# Patient Record
Sex: Female | Born: 1950 | Race: White | Hispanic: No | Marital: Married | Smoking: Never smoker
Health system: Southern US, Community
[De-identification: ages and names within clinical notes are randomized; demographics above are authoritative.]

## PROBLEM LIST (undated history)

## (undated) DIAGNOSIS — E039 Hypothyroidism, unspecified: Secondary | ICD-10-CM

## (undated) DIAGNOSIS — N186 End stage renal disease: Secondary | ICD-10-CM

## (undated) DIAGNOSIS — I10 Essential (primary) hypertension: Secondary | ICD-10-CM

## (undated) DIAGNOSIS — E079 Disorder of thyroid, unspecified: Secondary | ICD-10-CM

## (undated) DIAGNOSIS — I499 Cardiac arrhythmia, unspecified: Secondary | ICD-10-CM

## (undated) DIAGNOSIS — I4891 Unspecified atrial fibrillation: Secondary | ICD-10-CM

## (undated) HISTORY — PX: WRIST ARTHROSCOPY: SUR100

---

## 2008-11-06 ENCOUNTER — Emergency Department: Payer: Self-pay | Admitting: Emergency Medicine

## 2014-09-18 ENCOUNTER — Inpatient Hospital Stay: Payer: Self-pay | Admitting: Internal Medicine

## 2014-09-18 LAB — URINALYSIS, COMPLETE
BACTERIA: NONE SEEN
BILIRUBIN, UR: NEGATIVE
Blood: NEGATIVE
GLUCOSE, UR: NEGATIVE mg/dL (ref 0–75)
Hyaline Cast: 3
Ketone: NEGATIVE
Leukocyte Esterase: NEGATIVE
Nitrite: NEGATIVE
PROTEIN: NEGATIVE
Ph: 6 (ref 4.5–8.0)
RBC,UR: NONE SEEN /HPF (ref 0–5)
Specific Gravity: 1.005 (ref 1.003–1.030)
Squamous Epithelial: <1
WBC UR: NONE SEEN /HPF (ref 0–5)

## 2014-09-18 LAB — APTT: ACTIVATED PTT: 32.9 s (ref 23.6–35.9)

## 2014-09-18 LAB — MAGNESIUM: Magnesium: 1.7 mg/dL — ABNORMAL LOW

## 2014-09-18 LAB — CK TOTAL AND CKMB (NOT AT ARMC)
CK, TOTAL: 103 U/L (ref 26–192)
CK, Total: 103 U/L (ref 26–192)
CK, Total: 85 U/L (ref 26–192)
CK-MB: 1.3 ng/mL (ref 0.5–3.6)
CK-MB: 1.6 ng/mL (ref 0.5–3.6)
CK-MB: 1.7 ng/mL (ref 0.5–3.6)

## 2014-09-18 LAB — PROTIME-INR
INR: 1
Prothrombin Time: 13 secs (ref 11.5–14.7)

## 2014-09-18 LAB — CBC
HCT: 46.7 % (ref 35.0–47.0)
HGB: 15.6 g/dL (ref 12.0–16.0)
MCH: 32.6 pg (ref 26.0–34.0)
MCHC: 33.3 g/dL (ref 32.0–36.0)
MCV: 98 fL (ref 80–100)
PLATELETS: 241 10*3/uL (ref 150–440)
RBC: 4.78 10*6/uL (ref 3.80–5.20)
RDW: 12.5 % (ref 11.5–14.5)
WBC: 12.1 10*3/uL — AB (ref 3.6–11.0)

## 2014-09-18 LAB — COMPREHENSIVE METABOLIC PANEL
ALT: 31 U/L
Albumin: 4.3 g/dL (ref 3.4–5.0)
Alkaline Phosphatase: 118 U/L — ABNORMAL HIGH
Anion Gap: 10 (ref 7–16)
BILIRUBIN TOTAL: 0.8 mg/dL (ref 0.2–1.0)
BUN: 20 mg/dL — AB (ref 7–18)
CALCIUM: 9.1 mg/dL (ref 8.5–10.1)
CHLORIDE: 100 mmol/L (ref 98–107)
CO2: 26 mmol/L (ref 21–32)
Creatinine: 0.98 mg/dL (ref 0.60–1.30)
EGFR (African American): >60
EGFR (Non-African Amer.): >60
Glucose: 150 mg/dL — ABNORMAL HIGH (ref 65–99)
Osmolality: 277 (ref 275–301)
POTASSIUM: 3.1 mmol/L — AB (ref 3.5–5.1)
SGOT(AST): 27 U/L (ref 15–37)
Sodium: 136 mmol/L (ref 136–145)
Total Protein: 8.2 g/dL (ref 6.4–8.2)

## 2014-09-18 LAB — T4, FREE: Free Thyroxine: 1.31 ng/dL (ref 0.76–1.46)

## 2014-09-18 LAB — TROPONIN I
TROPONIN-I: 0.07 ng/mL — AB
Troponin-I: 0.03 ng/mL
Troponin-I: 0.06 ng/mL — ABNORMAL HIGH

## 2014-09-18 LAB — HEPARIN LEVEL (UNFRACTIONATED): Anti-Xa(Unfractionated): 0.64 IU/mL (ref 0.30–0.70)

## 2014-09-18 LAB — TSH: Thyroid Stimulating Horm: 3.83 u[IU]/mL

## 2014-09-19 LAB — CBC WITH DIFFERENTIAL/PLATELET
BASOS PCT: 0.5 %
Basophil #: 0 10*3/uL (ref 0.0–0.1)
Eosinophil #: 0.1 10*3/uL (ref 0.0–0.7)
Eosinophil %: 0.9 %
HCT: 43.3 % (ref 35.0–47.0)
HGB: 14.4 g/dL (ref 12.0–16.0)
LYMPHS PCT: 24.4 %
Lymphocyte #: 2.4 10*3/uL (ref 1.0–3.6)
MCH: 32.6 pg (ref 26.0–34.0)
MCHC: 33.2 g/dL (ref 32.0–36.0)
MCV: 98 fL (ref 80–100)
Monocyte #: 0.6 x10 3/mm (ref 0.2–0.9)
Monocyte %: 6.6 %
Neutrophil #: 6.6 10*3/uL — ABNORMAL HIGH (ref 1.4–6.5)
Neutrophil %: 67.6 %
Platelet: 225 10*3/uL (ref 150–440)
RBC: 4.4 10*6/uL (ref 3.80–5.20)
RDW: 12.6 % (ref 11.5–14.5)
WBC: 9.8 10*3/uL (ref 3.6–11.0)

## 2014-09-19 LAB — BASIC METABOLIC PANEL
Anion Gap: 8 (ref 7–16)
BUN: 10 mg/dL (ref 7–18)
Calcium, Total: 8.3 mg/dL — ABNORMAL LOW (ref 8.5–10.1)
Chloride: 109 mmol/L — ABNORMAL HIGH (ref 98–107)
Co2: 23 mmol/L (ref 21–32)
Creatinine: 0.62 mg/dL (ref 0.60–1.30)
EGFR (Non-African Amer.): >60
Glucose: 99 mg/dL (ref 65–99)
OSMOLALITY: 278 (ref 275–301)
POTASSIUM: 4.1 mmol/L (ref 3.5–5.1)
SODIUM: 140 mmol/L (ref 136–145)

## 2014-09-19 LAB — HEPARIN LEVEL (UNFRACTIONATED): ANTI-XA(UNFRACTIONATED): 0.87 [IU]/mL — AB (ref 0.30–0.70)

## 2014-09-19 LAB — MAGNESIUM: Magnesium: 2.1 mg/dL

## 2015-01-30 NOTE — Consult Note (Signed)
PATIENT NAME:  [name], [name] MR#:  [id number] DATE OF BIRTH:  1951/09/01  DATE OF CONSULTATION:  09/18/2014  REFERRING PHYSICIAN:  Dustin Flock, MD CONSULTING PHYSICIAN:  Isaias Cowman, MD  PRIMARY CARE PHYSICIAN:  [facility].   CHIEF COMPLAINT:  "I had this once before."   REASON FOR CONSULTATION: Consultation requested for evaluation of atrial fibrillation with a rapid ventricular rate.   HISTORY OF PRESENT ILLNESS: The patient is a 64 year old female with history of hypertension and probable prior episode of atrial fibrillation several years ago. She reports that she was in her usual state of health and presented to [facility] for routine visit when she was noted to have tachycardia and elevated blood pressure. The patient was sent to [facility] Emergency Room where she was noted to be in atrial fibrillation with a rapid ventricular rate with elevated blood pressure.  Admission labs were notable for borderline elevated troponin of 0.06.  The patient has mildly elevated white count at 12,000.   PAST MEDICAL HISTORY:  1.  Atrial fibrillation.  2.  Hypertension.  3.  Hyperlipidemia  4.  Hypothyroidism.   MEDICATIONS:  Diovan HCT 160/25 at 1 daily, diltiazem 180 mg daily, atorvastatin 20 mg daily, levothyroxine 50 mcg daily, citalopram 10 mg daily.   SOCIAL HISTORY: The patient is married, resides with her husband. She denies tobacco abuse.   FAMILY HISTORY: Father is status post coronary artery bypass graft surgery.   REVIEW OF SYSTEMS:  CONSTITUTIONAL: No fever or chills.  EYES: No blurry vision.  EARS: No hearing loss.  RESPIRATORY: No shortness of breath.  CARDIOVASCULAR: No chest pain.  GASTROINTESTINAL: No nausea, vomiting, or diarrhea.  GENITOURINARY: No dysuria or hematuria.  ENDOCRINE: No polyuria or polydipsia. MUSCULOSKELETAL: No arthralgias or myalgias.  NEUROLOGIC: No focal muscle use some numbness.  PSYCHOLOGICAL: No depression or anxiety.    PHYSICAL EXAMINATION:  VITAL SIGNS: Blood pressure 129/82, pulse fluctuating from 91 to 120, irregularly irregular.  HEENT: Pupils equal and reactive to light and accommodation.  NECK: Supple without thyromegaly.  LUNGS: Clear.  HEART: Normal JVP. Normal PMI. Tachycardia. Normal S1, S2. No appreciable gallop, murmur, or rub.  ABDOMEN: Soft and nontender. Pulses were intact bilaterally.  MUSCULOSKELETAL: Normal muscle tone.  NEUROLOGIC: The patient is alert and oriented x 3. Motor and sensory both grossly intact.   IMPRESSION: A 64 year old female who presents with elevated blood pressure, atrial fibrillation with a rapid ventricular rate, currently asymptomatic without palpitations or chest pain.   RECOMMENDATIONS:  1.  Agree with all overall current therapy.  2.  Continue diltiazem drip, titrate to heart rate. 3.  Add metoprolol 25 mg b.i.d.  4.  Cycle cardiac enzymes.  5.  Review 2-D echocardiogram.  6.  The patient has a CHADS2 score of 1, may consider long term anticoagulation pending the patient's initial clinical course.       ____________________________ Isaias Cowman, MD ap:DT D: 09/18/2014 15:39:17 ET T: 09/18/2014 16:56:45 ET JOB#: [id number]  cc: Isaias Cowman, MD, <Dictator> Isaias Cowman MD ELECTRONICALLY SIGNED 10/06/2014 13:07

## 2015-01-30 NOTE — H&P (Signed)
PATIENT NAME:  [name], [name] MR#:  [id number] DATE OF BIRTH:  [birth year]  DATE OF ADMISSION:  09/18/2014  PRIMARY CARE PROVIDER: [facility], [place].   EMERGENCY DEPARTMENT REFERRING PHYSICIAN: Dr. Jimmye Norman.   CHIEF COMPLAINT: Sent from primary care provider for elevated heart rate and elevated blood pressure.   HISTORY OF PRESENT ILLNESS: The patient is a 64 year old, white female with history of atrial fibrillation and has been seen by Dr. Nehemiah Massed about 5 years ago. At that time, was noted to have Afib and then apparently converted to sinus rhythm. Has never followed back up with him. She went to see her primary care provider for follow-up today and was noted to have a blood pressure that was extremely elevated, heart rate that was very high in the 140s to 150s. When she initially arrived in the ED, heart rate was 175. She received a total of 35 mg of IV Cardizem and heart rate continued to stay elevated; therefore, we were asked to admit the patient. The patient reports that last night, she felt like her heart was beating fast but today she did not have that sensation. She did not have any chest pain or shortness of breath. Did not have any nausea, vomiting or diarrhea. Denies any fevers, chills. No urinary frequency, urgency or hesitancy.  PAST MEDICAL HISTORY:   1. Hyperlipidemia.  2. Hypertension.  3. Hypothyroidism.  4. History of Afib in the past.   PAST SURGICAL HISTORY: Left wrist fracture repair.   ALLERGIES: None.   CURRENT MEDICATIONS: She is on thyroxine 50 mcg daily, Diovan/hydrochlorothiazide 25/160 daily, diltiazem 180 daily, citalopram 10 daily, atorvastatin 20 at bedtime.   SOCIAL HISTORY: Does not smoke. Does not drink. No drug use.   FAMILY HISTORY: Positive for mother with valvular disease. Father with coronary artery disease.   REVIEW OF SYSTEMS:  CONSTITUTIONAL: Denies any fevers, chills. No weight loss. No weight gain.   HEENT: Denies any blurred vision. No  cataracts. No glaucoma. No nasal drainage. No epistaxis. No difficulty swallowing. No ringing in the ears. No tinnitus.   CARDIOVASCULAR: Complains of palpitations from time to time. History of atrial fibrillation. No chest pains. No orthopnea. No syncope.   PULMONARY: Denies any cough, wheezing, hemoptysis. No COPD. No asthma. No wheezing.   GASTROINTESTINAL: Denies any nausea, vomiting or diarrhea. Denies any hematemesis, hematochezia.   GENITOURINARY: Denies any frequent no frequency, urgency or hesitancy.   ENDOCRINE: Denies any polydipsia, polyuria.   PSYCHIATRIC: Denies any anxiety, insomnia.   NEUROLOGIC: Denies CVA, TIA.   MUSCULOSKELETAL: Denies any erythema or swelling.   SKIN: No rash.   LYMPHATICS: Denies any lymph node enlargement.   PHYSICAL EXAMINATION: VITAL SIGNS: Temperature 98.7 pulse was 144, respirations 30, blood pressure 140/102, O2 98%.   GENERAL: The patient is a well-developed female.   HEENT: Head atraumatic, normocephalic. Pupils equally round and reactive to light and accommodation. There is no conjunctival pallor. No scleral icterus. Nasal exam shows no drainage or ulceration. Oropharynx is clear without any exudate.   NECK: Supple without any thyromegaly.   CARDIOVASCULAR: Irregularly irregular rhythm. There are no murmurs, rubs, clicks or gallops.   LUNGS: Clear to auscultation bilaterally without any rales, rhonchi, wheezing.   ABDOMEN: Soft, nontender, nondistended. Positive bowel sounds x 4. No hepatosplenomegaly.   EXTREMITIES: No clubbing, cyanosis or edema.   SKIN: No rash.   LYMPH NODES: Nonpalpable.   VASCULAR: Good DP/PT pulses.   PSYCHIATRIC: Not anxious or depressed.   NEUROLOGIC:  Awake, alert,  oriented x 3. No focal deficits.   LABORATORY DATA: Urinalysis: Nitrites negative, leukocytes negative. Chest x-ray shows no acute cardiopulmonary processes. Troponin less than 0.03, INR 1.0. WBC 12.1, hemoglobin 15.6, platelet count  251,000. Glucose 150, BUN 20, creatinine 0.96, sodium 136, potassium 3.1, chloride 100, alkaline phosphatase is 118. TSH 3.83. Magnesium level is 1.7.   ASSESSMENT AND PLAN: The patient is a 64 year old, white female with history of atrial fibrillation, seen by primary care and noted to have elevated blood pressure and elevated heart rate.  1. Atrial fibrillation with rapid ventricular response. At this time, I will give patient 1 dose of IV digoxin start her on a Cardizem drip, continue the heparin drip as started by the emergency department MD. The patient's CHADS score is 1. Anticoagulation, chronic, to be decided by cardiology. We will obtain an echocardiogram of the heart. Check serial cardiac enzymes, replace her potassium and her magnesium.  2. Accelerated hypertension. We will continue Diovan/hydrochlorothiazide and IV Cardizem drip should help with the blood pressure as well.  3. Hypothyroidism. Thyroid-stimulating hormone within normal. Continue supplements as taking at home.  4. Hyperlipidemia. Continue atorvastatin. Fasting lipid panel in the morning.  5. Depression and anxiety. Continue Celexa.  6. Miscellaneous. The patient will be on heparin drip, which should suffice for deep vein thrombosis prophylaxis.   TIME SPENT: 55 minutes of critical care time. The patient at high risk of cardiopulmonary arrest. In light of continuous IV drip, she will need ICU monitoring and adjustment of her Cardizem drip.     ____________________________ Lafonda Mosses. Posey Pronto, MD shp:TT D: 09/18/2014 14:54:25 ET T: 09/18/2014 15:56:31 ET JOB#: [id number]  cc: Torsten Weniger H. Posey Pronto, MD, <Dictator> Alric Seton MD ELECTRONICALLY SIGNED 09/23/2014 10:39

## 2015-01-30 NOTE — Discharge Summary (Signed)
PATIENT NAMECHRISTASIA, [name] MR#:  [id number] DATE OF BIRTH:  1951-06-15  DATE OF ADMISSION:  09/18/2014 DATE OF DISCHARGE:  09/20/2014  PRIMARY CARE PHYSICIAN: No local.   DISCHARGE DIAGNOSES:  1. Atrial fibrillation with rapid ventricular response.  2. Accelerated hypertension.  3. Hyperlipidemia.   CONDITION: Stable.   CODE STATUS: Full code.   HOME MEDICATIONS: Please refer to the medication reconciliation list.   DIET: Low-sodium, low-fat, low-cholesterol diet.   ACTIVITY: As tolerated.   FOLLOWUP CARE: Follow with PCP and Corey Skains, MD within 1 to 2 weeks.   REASON FOR ADMISSION: Sent from primary care physician for elevated heart rate and elevated blood pressure.   HOSPITAL COURSE: The patient is a 64 year old Caucasian female with a history of hypertension, hyperlipidemia, history of atrial fibrillation in the past, was sent by primary care physician due to antegrade heart rate and blood pressure. The patient's heart rate was 175.  In the ED received a 75 mg IV Cardizem, resulted in improvement. The patient was started Cardizem drip and admitted to the Intensive Care Unit. For detailed history and physical examination, please refer to the admission note dictated by Dr. Dustin Flock.   LABORATORY DATA: On admission date was unremarkable. Chest x-ray showed no acute cardiopulmonary process. Troponin less than 0.03.   ASSESSMENT AND PLAN: Atrial fibrillation with rapid ventricular response. The patient was treated with Cardizem drip in Critical Care Unit. The patient has no symptoms, but heart rate was fast at 130 to 140 yesterday. Dr. Saralyn Pilar suggested to start Lopressor and wean off  Cardizem drip. After taken off of Cardizem drip, the patient's heart rate is under control with Lopressor. The patient has no complaints. Her vital signs are stable. Blood pressure is under control, and Dr. Saralyn Pilar suggested starting Pradaxa for anticoagulation. The patient's atrial  fibrillation with rapid ventricular response converted to sinus rhythm. The patient is clinically stable and will be discharged to home today. I discussed the patient's discharge plan with the patient and nurse.   TIME SPENT: About 36 minutes.    ____________________________ Demetrios Loll, MD qc:ap D: 09/20/2014 11:07:23 ET T: 09/20/2014 11:31:52 ET JOB#: [id number]  cc: Demetrios Loll, MD, <Dictator> Demetrios Loll MD ELECTRONICALLY SIGNED 09/20/2014 15:15

## 2017-03-23 ENCOUNTER — Ambulatory Visit
Admission: RE | Admit: 2017-03-23 | Discharge: 2017-03-23 | Disposition: A | Discharge status: Home / Self Care | Payer: Medicare Other | Source: Ambulatory Visit | Attending: Internal Medicine | Admitting: Internal Medicine

## 2017-03-23 ENCOUNTER — Other Ambulatory Visit: Payer: Self-pay | Admitting: Internal Medicine

## 2017-03-23 DIAGNOSIS — R600 Localized edema: Secondary | ICD-10-CM | POA: Insufficient documentation

## 2017-03-28 ENCOUNTER — Other Ambulatory Visit: Payer: Self-pay | Admitting: Internal Medicine

## 2017-03-28 DIAGNOSIS — R7989 Other specified abnormal findings of blood chemistry: Secondary | ICD-10-CM

## 2017-03-28 DIAGNOSIS — R945 Abnormal results of liver function studies: Secondary | ICD-10-CM

## 2017-04-04 ENCOUNTER — Ambulatory Visit
Admission: RE | Admit: 2017-04-04 | Discharge: 2017-04-04 | Disposition: A | Discharge status: Home / Self Care | Payer: Medicare Other | Source: Ambulatory Visit | Attending: Internal Medicine | Admitting: Internal Medicine

## 2017-04-04 DIAGNOSIS — R7989 Other specified abnormal findings of blood chemistry: Secondary | ICD-10-CM

## 2017-04-04 DIAGNOSIS — M47816 Spondylosis without myelopathy or radiculopathy, lumbar region: Secondary | ICD-10-CM | POA: Diagnosis not present

## 2017-04-04 DIAGNOSIS — J90 Pleural effusion, not elsewhere classified: Secondary | ICD-10-CM | POA: Insufficient documentation

## 2017-04-04 DIAGNOSIS — R945 Abnormal results of liver function studies: Secondary | ICD-10-CM | POA: Diagnosis present

## 2017-04-04 DIAGNOSIS — J9811 Atelectasis: Secondary | ICD-10-CM | POA: Diagnosis not present

## 2017-04-04 DIAGNOSIS — K8020 Calculus of gallbladder without cholecystitis without obstruction: Secondary | ICD-10-CM | POA: Insufficient documentation

## 2017-04-04 DIAGNOSIS — I10 Essential (primary) hypertension: Secondary | ICD-10-CM | POA: Diagnosis not present

## 2017-04-04 DIAGNOSIS — I700 Atherosclerosis of aorta: Secondary | ICD-10-CM | POA: Insufficient documentation

## 2017-04-04 HISTORY — DX: Essential (primary) hypertension: I10

## 2017-04-04 MED ORDER — IOPAMIDOL (ISOVUE-300) INJECTION 61%
100.0000 mL | Freq: Once | INTRAVENOUS | Status: AC | PRN
  Administered 2017-04-04: 100 mL via INTRAVENOUS

## 2017-04-08 ENCOUNTER — Inpatient Hospital Stay
Admission: EM | Admit: 2017-04-08 | Discharge: 2017-05-07 | DRG: 870 | Disposition: A | Discharge status: Rehabilitation Facility | Payer: Medicare Other | Attending: Internal Medicine | Admitting: Internal Medicine

## 2017-04-08 ENCOUNTER — Encounter: Payer: Self-pay | Admitting: Emergency Medicine

## 2017-04-08 ENCOUNTER — Inpatient Hospital Stay: Payer: Medicare Other

## 2017-04-08 ENCOUNTER — Inpatient Hospital Stay
Admit: 2017-04-08 | Discharge: 2017-04-08 | Disposition: A | Discharge status: Home / Self Care | Payer: Medicare Other | Attending: Internal Medicine | Admitting: Internal Medicine

## 2017-04-08 ENCOUNTER — Emergency Department: Payer: Medicare Other

## 2017-04-08 DIAGNOSIS — G9340 Encephalopathy, unspecified: Secondary | ICD-10-CM | POA: Diagnosis not present

## 2017-04-08 DIAGNOSIS — J189 Pneumonia, unspecified organism: Secondary | ICD-10-CM | POA: Diagnosis not present

## 2017-04-08 DIAGNOSIS — R6521 Severe sepsis with septic shock: Secondary | ICD-10-CM | POA: Diagnosis not present

## 2017-04-08 DIAGNOSIS — N189 Chronic kidney disease, unspecified: Secondary | ICD-10-CM | POA: Diagnosis not present

## 2017-04-08 DIAGNOSIS — L89309 Pressure ulcer of unspecified buttock, unspecified stage: Secondary | ICD-10-CM | POA: Insufficient documentation

## 2017-04-08 DIAGNOSIS — L03119 Cellulitis of unspecified part of limb: Secondary | ICD-10-CM

## 2017-04-08 DIAGNOSIS — R600 Localized edema: Secondary | ICD-10-CM | POA: Diagnosis not present

## 2017-04-08 DIAGNOSIS — E11649 Type 2 diabetes mellitus with hypoglycemia without coma: Secondary | ICD-10-CM | POA: Diagnosis present

## 2017-04-08 DIAGNOSIS — A419 Sepsis, unspecified organism: Secondary | ICD-10-CM | POA: Diagnosis present

## 2017-04-08 DIAGNOSIS — F329 Major depressive disorder, single episode, unspecified: Secondary | ICD-10-CM | POA: Diagnosis not present

## 2017-04-08 DIAGNOSIS — E876 Hypokalemia: Secondary | ICD-10-CM

## 2017-04-08 DIAGNOSIS — K7200 Acute and subacute hepatic failure without coma: Secondary | ICD-10-CM | POA: Diagnosis not present

## 2017-04-08 DIAGNOSIS — N186 End stage renal disease: Secondary | ICD-10-CM | POA: Diagnosis present

## 2017-04-08 DIAGNOSIS — Z7901 Long term (current) use of anticoagulants: Secondary | ICD-10-CM

## 2017-04-08 DIAGNOSIS — E46 Unspecified protein-calorie malnutrition: Secondary | ICD-10-CM | POA: Diagnosis not present

## 2017-04-08 DIAGNOSIS — I469 Cardiac arrest, cause unspecified: Secondary | ICD-10-CM | POA: Diagnosis not present

## 2017-04-08 DIAGNOSIS — E43 Unspecified severe protein-calorie malnutrition: Secondary | ICD-10-CM | POA: Diagnosis not present

## 2017-04-08 DIAGNOSIS — D65 Disseminated intravascular coagulation [defibrination syndrome]: Secondary | ICD-10-CM | POA: Diagnosis not present

## 2017-04-08 DIAGNOSIS — L03116 Cellulitis of left lower limb: Secondary | ICD-10-CM | POA: Diagnosis present

## 2017-04-08 DIAGNOSIS — I959 Hypotension, unspecified: Secondary | ICD-10-CM | POA: Diagnosis not present

## 2017-04-08 DIAGNOSIS — Z4659 Encounter for fitting and adjustment of other gastrointestinal appliance and device: Secondary | ICD-10-CM

## 2017-04-08 DIAGNOSIS — J90 Pleural effusion, not elsewhere classified: Secondary | ICD-10-CM

## 2017-04-08 DIAGNOSIS — I482 Chronic atrial fibrillation: Secondary | ICD-10-CM | POA: Diagnosis present

## 2017-04-08 DIAGNOSIS — N170 Acute kidney failure with tubular necrosis: Secondary | ICD-10-CM | POA: Diagnosis present

## 2017-04-08 DIAGNOSIS — I82622 Acute embolism and thrombosis of deep veins of left upper extremity: Secondary | ICD-10-CM | POA: Diagnosis not present

## 2017-04-08 DIAGNOSIS — R509 Fever, unspecified: Secondary | ICD-10-CM | POA: Diagnosis not present

## 2017-04-08 DIAGNOSIS — L03115 Cellulitis of right lower limb: Secondary | ICD-10-CM | POA: Diagnosis present

## 2017-04-08 DIAGNOSIS — J9600 Acute respiratory failure, unspecified whether with hypoxia or hypercapnia: Secondary | ICD-10-CM | POA: Diagnosis not present

## 2017-04-08 DIAGNOSIS — J9601 Acute respiratory failure with hypoxia: Secondary | ICD-10-CM | POA: Diagnosis not present

## 2017-04-08 DIAGNOSIS — R680 Hypothermia, not associated with low environmental temperature: Secondary | ICD-10-CM | POA: Diagnosis not present

## 2017-04-08 DIAGNOSIS — E872 Acidosis: Secondary | ICD-10-CM | POA: Diagnosis not present

## 2017-04-08 DIAGNOSIS — I5084 End stage heart failure: Secondary | ICD-10-CM | POA: Diagnosis not present

## 2017-04-08 DIAGNOSIS — R601 Generalized edema: Secondary | ICD-10-CM | POA: Diagnosis not present

## 2017-04-08 DIAGNOSIS — E1165 Type 2 diabetes mellitus with hyperglycemia: Secondary | ICD-10-CM | POA: Diagnosis present

## 2017-04-08 DIAGNOSIS — N049 Nephrotic syndrome with unspecified morphologic changes: Secondary | ICD-10-CM | POA: Diagnosis present

## 2017-04-08 DIAGNOSIS — L89892 Pressure ulcer of other site, stage 2: Secondary | ICD-10-CM | POA: Diagnosis present

## 2017-04-08 DIAGNOSIS — N171 Acute kidney failure with acute cortical necrosis: Secondary | ICD-10-CM | POA: Diagnosis not present

## 2017-04-08 DIAGNOSIS — D6489 Other specified anemias: Secondary | ICD-10-CM | POA: Diagnosis not present

## 2017-04-08 DIAGNOSIS — Z452 Encounter for adjustment and management of vascular access device: Secondary | ICD-10-CM

## 2017-04-08 DIAGNOSIS — Z7189 Other specified counseling: Secondary | ICD-10-CM | POA: Diagnosis not present

## 2017-04-08 DIAGNOSIS — I5023 Acute on chronic systolic (congestive) heart failure: Secondary | ICD-10-CM | POA: Diagnosis present

## 2017-04-08 DIAGNOSIS — R609 Edema, unspecified: Secondary | ICD-10-CM

## 2017-04-08 DIAGNOSIS — I248 Other forms of acute ischemic heart disease: Secondary | ICD-10-CM | POA: Diagnosis not present

## 2017-04-08 DIAGNOSIS — E039 Hypothyroidism, unspecified: Secondary | ICD-10-CM | POA: Diagnosis present

## 2017-04-08 DIAGNOSIS — I420 Dilated cardiomyopathy: Secondary | ICD-10-CM | POA: Diagnosis present

## 2017-04-08 DIAGNOSIS — Z992 Dependence on renal dialysis: Secondary | ICD-10-CM

## 2017-04-08 DIAGNOSIS — Z01818 Encounter for other preprocedural examination: Secondary | ICD-10-CM

## 2017-04-08 DIAGNOSIS — N179 Acute kidney failure, unspecified: Secondary | ICD-10-CM | POA: Diagnosis not present

## 2017-04-08 DIAGNOSIS — D631 Anemia in chronic kidney disease: Secondary | ICD-10-CM | POA: Diagnosis present

## 2017-04-08 DIAGNOSIS — R570 Cardiogenic shock: Secondary | ICD-10-CM | POA: Diagnosis not present

## 2017-04-08 DIAGNOSIS — Z515 Encounter for palliative care: Secondary | ICD-10-CM

## 2017-04-08 DIAGNOSIS — J9690 Respiratory failure, unspecified, unspecified whether with hypoxia or hypercapnia: Secondary | ICD-10-CM

## 2017-04-08 DIAGNOSIS — E8770 Fluid overload, unspecified: Secondary | ICD-10-CM | POA: Diagnosis not present

## 2017-04-08 DIAGNOSIS — E1122 Type 2 diabetes mellitus with diabetic chronic kidney disease: Secondary | ICD-10-CM | POA: Diagnosis present

## 2017-04-08 DIAGNOSIS — J810 Acute pulmonary edema: Secondary | ICD-10-CM | POA: Diagnosis not present

## 2017-04-08 DIAGNOSIS — I132 Hypertensive heart and chronic kidney disease with heart failure and with stage 5 chronic kidney disease, or end stage renal disease: Secondary | ICD-10-CM | POA: Diagnosis present

## 2017-04-08 DIAGNOSIS — R6520 Severe sepsis without septic shock: Secondary | ICD-10-CM | POA: Diagnosis not present

## 2017-04-08 DIAGNOSIS — R34 Anuria and oliguria: Secondary | ICD-10-CM | POA: Diagnosis not present

## 2017-04-08 DIAGNOSIS — Z6827 Body mass index (BMI) 27.0-27.9, adult: Secondary | ICD-10-CM

## 2017-04-08 DIAGNOSIS — N172 Acute kidney failure with medullary necrosis: Secondary | ICD-10-CM | POA: Diagnosis not present

## 2017-04-08 DIAGNOSIS — I509 Heart failure, unspecified: Secondary | ICD-10-CM

## 2017-04-08 DIAGNOSIS — R7989 Other specified abnormal findings of blood chemistry: Secondary | ICD-10-CM | POA: Diagnosis not present

## 2017-04-08 DIAGNOSIS — Z66 Do not resuscitate: Secondary | ICD-10-CM | POA: Diagnosis not present

## 2017-04-08 HISTORY — DX: Disorder of thyroid, unspecified: E07.9

## 2017-04-08 HISTORY — DX: Unspecified atrial fibrillation: I48.91

## 2017-04-08 LAB — CBC WITH DIFFERENTIAL/PLATELET
BASOS ABS: 0.1 10*3/uL (ref 0–0.1)
BASOS PCT: 1 %
EOS ABS: 0 10*3/uL (ref 0–0.7)
Eosinophils Relative: 0 %
HCT: 45.2 % (ref 35.0–47.0)
HEMOGLOBIN: 15 g/dL (ref 12.0–16.0)
Lymphocytes Relative: 6 %
Lymphs Abs: 0.7 10*3/uL — ABNORMAL LOW (ref 1.0–3.6)
MCH: 31.5 pg (ref 26.0–34.0)
MCHC: 33.2 g/dL (ref 32.0–36.0)
MCV: 95 fL (ref 80.0–100.0)
MONOS PCT: 2 %
Monocytes Absolute: 0.3 10*3/uL (ref 0.2–0.9)
NEUTROS ABS: 12 10*3/uL — AB (ref 1.4–6.5)
Neutrophils Relative %: 91 %
Platelets: 142 10*3/uL — ABNORMAL LOW (ref 150–440)
RBC: 4.76 MIL/uL (ref 3.80–5.20)
RDW: 15.5 % — ABNORMAL HIGH (ref 11.5–14.5)
WBC: 13.2 10*3/uL — AB (ref 3.6–11.0)

## 2017-04-08 LAB — URINALYSIS, COMPLETE (UACMP) WITH MICROSCOPIC
Bilirubin Urine: NEGATIVE
Glucose, UA: NEGATIVE mg/dL
Ketones, ur: NEGATIVE mg/dL
Leukocytes, UA: NEGATIVE
Nitrite: NEGATIVE
Protein, ur: 100 mg/dL — AB
Specific Gravity, Urine: 1.024 (ref 1.005–1.030)
pH: 5 (ref 5.0–8.0)

## 2017-04-08 LAB — TSH: TSH: 9.31 u[IU]/mL — ABNORMAL HIGH (ref 0.350–4.500)

## 2017-04-08 LAB — COMPREHENSIVE METABOLIC PANEL
ALBUMIN: 2.8 g/dL — AB (ref 3.5–5.0)
ALK PHOS: 166 U/L — AB (ref 38–126)
ALT: 44 U/L (ref 14–54)
ANION GAP: 13 (ref 5–15)
AST: 46 U/L — AB (ref 15–41)
BUN: 24 mg/dL — AB (ref 6–20)
CALCIUM: 7.8 mg/dL — AB (ref 8.9–10.3)
CO2: 25 mmol/L (ref 22–32)
Chloride: 104 mmol/L (ref 101–111)
Creatinine, Ser: 1.94 mg/dL — ABNORMAL HIGH (ref 0.44–1.00)
GFR calc Af Amer: 30 mL/min — ABNORMAL LOW (ref >60)
GFR calc non Af Amer: 26 mL/min — ABNORMAL LOW (ref >60)
GLUCOSE: 77 mg/dL (ref 65–99)
POTASSIUM: 2.7 mmol/L — AB (ref 3.5–5.1)
SODIUM: 142 mmol/L (ref 135–145)
Total Bilirubin: 1.8 mg/dL — ABNORMAL HIGH (ref 0.3–1.2)
Total Protein: 5.5 g/dL — ABNORMAL LOW (ref 6.5–8.1)

## 2017-04-08 LAB — STREP PNEUMONIAE URINARY ANTIGEN: Strep Pneumo Urinary Antigen: POSITIVE — AB

## 2017-04-08 LAB — CREATININE, SERUM
Creatinine, Ser: 2 mg/dL — ABNORMAL HIGH (ref 0.44–1.00)
GFR calc Af Amer: 29 mL/min — ABNORMAL LOW
GFR calc non Af Amer: 25 mL/min — ABNORMAL LOW

## 2017-04-08 LAB — C DIFFICILE QUICK SCREEN W PCR REFLEX
C Diff antigen: NEGATIVE
C Diff interpretation: NOT DETECTED
C Diff toxin: NEGATIVE

## 2017-04-08 LAB — APTT: aPTT: 111 seconds — ABNORMAL HIGH (ref 24–36)

## 2017-04-08 LAB — BUN: BUN: 29 mg/dL — ABNORMAL HIGH (ref 6–20)

## 2017-04-08 LAB — PROTIME-INR
INR: 6.37
Prothrombin Time: 58.1 s — ABNORMAL HIGH (ref 11.4–15.2)

## 2017-04-08 LAB — PROTEIN / CREATININE RATIO, URINE
CREATININE, URINE: 148 mg/dL
PROTEIN CREATININE RATIO: 1.12 mg/mg{creat} — AB (ref 0.00–0.15)
TOTAL PROTEIN, URINE: 166 mg/dL

## 2017-04-08 LAB — GLUCOSE, CAPILLARY
Glucose-Capillary: 65 mg/dL (ref 65–99)
Glucose-Capillary: 65 mg/dL (ref 65–99)

## 2017-04-08 LAB — CORTISOL

## 2017-04-08 LAB — MRSA PCR SCREENING: MRSA BY PCR: NEGATIVE

## 2017-04-08 LAB — LACTIC ACID, PLASMA
Lactic Acid, Venous: 5.2 mmol/L (ref 0.5–1.9)
Lactic Acid, Venous: 4.5 mmol/L (ref 0.5–1.9)

## 2017-04-08 LAB — TROPONIN I: Troponin I: 0.05 ng/mL (ref <0.03)

## 2017-04-08 LAB — BRAIN NATRIURETIC PEPTIDE: B NATRIURETIC PEPTIDE 5: 2012 pg/mL — AB (ref 0.0–100.0)

## 2017-04-08 LAB — POTASSIUM: Potassium: 3 mmol/L — ABNORMAL LOW (ref 3.5–5.1)

## 2017-04-08 MED ORDER — ACETAMINOPHEN 650 MG RE SUPP: 650.0000 mg | Freq: Four times a day (QID) | RECTAL | Status: DC | PRN

## 2017-04-08 MED ORDER — DOCUSATE SODIUM 100 MG PO CAPS
100.0000 mg | ORAL_CAPSULE | Freq: Two times a day (BID) | ORAL | Status: DC
  Administered 2017-04-08: 100 mg via ORAL
  Filled 2017-04-08: qty 1

## 2017-04-08 MED ORDER — PIPERACILLIN-TAZOBACTAM 3.375 G IVPB 30 MIN
3.3750 g | Freq: Once | INTRAVENOUS | Status: AC
Start: 2017-04-08 — End: 2017-04-08
  Administered 2017-04-08: 3.375 g via INTRAVENOUS
  Filled 2017-04-08 (×2): qty 50

## 2017-04-08 MED ORDER — POTASSIUM CHLORIDE CRYS ER 20 MEQ PO TBCR
60.0000 meq | EXTENDED_RELEASE_TABLET | Freq: Once | ORAL | Status: AC
Start: 2017-04-08 — End: 2017-04-08
  Administered 2017-04-08: 60 meq via ORAL
  Filled 2017-04-08: qty 3

## 2017-04-08 MED ORDER — SODIUM CHLORIDE 0.9 % IV SOLN
0.0000 ug/min | INTRAVENOUS | Status: DC
  Administered 2017-04-08: 20 ug/min via INTRAVENOUS
  Administered 2017-04-08: 100 ug/min via INTRAVENOUS
  Administered 2017-04-08: 60 ug/min via INTRAVENOUS
  Filled 2017-04-08 (×4): qty 1

## 2017-04-08 MED ORDER — HEPARIN SODIUM (PORCINE) 5000 UNIT/ML IJ SOLN
5000.0000 [IU] | Freq: Three times a day (TID) | INTRAMUSCULAR | Status: DC
Start: 2017-04-08 — End: 2017-04-08

## 2017-04-08 MED ORDER — POTASSIUM CHLORIDE 20 MEQ PO PACK
40.0000 meq | PACK | Freq: Once | ORAL | Status: AC
  Administered 2017-04-08: 40 meq via ORAL
  Filled 2017-04-08: qty 2

## 2017-04-08 MED ORDER — VANCOMYCIN HCL IN DEXTROSE 1-5 GM/200ML-% IV SOLN
1000.0000 mg | Freq: Once | INTRAVENOUS | Status: AC
  Administered 2017-04-08: 1000 mg via INTRAVENOUS
  Filled 2017-04-08: qty 200

## 2017-04-08 MED ORDER — PHYTONADIONE 5 MG PO TABS
5.0000 mg | ORAL_TABLET | Freq: Once | ORAL | Status: AC
  Administered 2017-04-08: 5 mg via ORAL
  Filled 2017-04-08: qty 1

## 2017-04-08 MED ORDER — SODIUM CHLORIDE 0.9 % IV SOLN
INTRAVENOUS | Status: DC
  Administered 2017-04-08 (×2): via INTRAVENOUS

## 2017-04-08 MED ORDER — ONDANSETRON HCL 4 MG PO TABS: 4.0000 mg | ORAL_TABLET | Freq: Four times a day (QID) | ORAL | Status: DC | PRN

## 2017-04-08 MED ORDER — GERHARDT'S BUTT CREAM: TOPICAL_CREAM | Freq: Four times a day (QID) | CUTANEOUS | Status: DC | PRN

## 2017-04-08 MED ORDER — SODIUM CHLORIDE 0.9 % IV BOLUS (SEPSIS)
1000.0000 mL | Freq: Once | INTRAVENOUS | Status: AC
  Administered 2017-04-08: 1000 mL via INTRAVENOUS

## 2017-04-08 MED ORDER — VANCOMYCIN HCL IN DEXTROSE 750-5 MG/150ML-% IV SOLN
750.0000 mg | INTRAVENOUS | Status: DC
  Administered 2017-04-08: 750 mg via INTRAVENOUS
  Filled 2017-04-08 (×4): qty 150

## 2017-04-08 MED ORDER — PIPERACILLIN-TAZOBACTAM 3.375 G IVPB
3.3750 g | Freq: Three times a day (TID) | INTRAVENOUS | Status: DC
  Administered 2017-04-08 – 2017-04-09 (×4): 3.375 g via INTRAVENOUS
  Filled 2017-04-08 (×6): qty 50

## 2017-04-08 MED ORDER — ACETAMINOPHEN 325 MG PO TABS: 650.0000 mg | ORAL_TABLET | Freq: Four times a day (QID) | ORAL | Status: DC | PRN

## 2017-04-08 MED ORDER — ZINC OXIDE 11.3 % EX CREA
TOPICAL_CREAM | Freq: Four times a day (QID) | CUTANEOUS | Status: DC | PRN
  Filled 2017-04-08: qty 56

## 2017-04-08 MED ORDER — ONDANSETRON HCL 4 MG/2ML IJ SOLN: 4.0000 mg | Freq: Four times a day (QID) | INTRAMUSCULAR | Status: DC | PRN

## 2017-04-08 MED ORDER — SODIUM CHLORIDE 0.9 % IV SOLN
0.0000 ug/min | INTRAVENOUS | Status: DC
  Administered 2017-04-08: 20 ug/min via INTRAVENOUS
  Administered 2017-04-09: 40 ug/min via INTRAVENOUS
  Filled 2017-04-08 (×3): qty 4

## 2017-04-08 MED ORDER — DABIGATRAN ETEXILATE MESYLATE 75 MG PO CAPS
75.0000 mg | ORAL_CAPSULE | Freq: Two times a day (BID) | ORAL | Status: DC
  Administered 2017-04-08 (×2): 75 mg via ORAL
  Filled 2017-04-08 (×3): qty 1

## 2017-04-08 NOTE — Consult Note (Signed)
[facility] Nurse wound consult note Reason for Consult: Bilateral feet and LLE (anterior aspect) with increased edema, blistering and ecchymosis. May be related to change in cardiac medication (she was recently started on valsartan). This medication has been discontinued. 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 Left foot: 2nd digit with intact blister (serum filled) measuring 2cm x 1.5cm.  Anterior foot with 3cm x 5cm purple discoloration.  There aer several small intact serum filled blisters on the lateral and medial foot, the largest of which measures 0.4cm round x 0.1cm. Left LE (anterior aspect):  9cm x 5cm area of purple discoloration with loose hanging serum filled blister at distal and lateral edge of wound measuring 4cm x 2cm. Wound bed:As described above Drainage (amount, consistency, odor) Serous Periwound: intact.  Bilateral LE edema is resolving with elevation, but is still present. Dressing procedure/placement/frequency: I will implement twice daily wound care using silver sulfadiazine cream (Silvadene) topped with moist dressings and covered with dry padded dressings.  This will be secured with toe-to-knee Kerlix roll gauze topped with ACE wrap applied in the same fashion for gentle compression.  Elevation will be enhanced and feet protected by Prevalon pressure redistribution heel boots.  [facility] nursing team will not follow, but will remain available to this patient, the nursing and medical teams.  Please re-consult if needed. Thanks, Maudie Flakes, MSN, RN, [place], Arther Abbott  Pager# [phone]

## 2017-04-08 NOTE — Plan of Care (Signed)
Results for BROOK, GERACI (MRN [id number]) as of 04/08/2017 13:57  Ref. Range 04/08/2017 12:17  Prothrombin Time Latest Ref Range: 11.4 - 15.2 seconds 58.1 (H)  INR Unknown 6.37 (HH)     MD notified

## 2017-04-08 NOTE — Progress Notes (Signed)
[facility] Progress Note Patient Name: Amyia Lodwick DOB: 07-15-1951 MRN: [id number]   Date of Service  04/08/2017  HPI/Events of Note  Sepsis ? Cellulitis Only 1L fluid given since pedal edema & high BNP  eICU Interventions  Remains hypotensive, lactate clearing, start neo gtt low dose     Intervention Category Evaluation Type: New Patient Evaluation  ALVA,RAKESH V. 04/08/2017, 7:02 AM

## 2017-04-08 NOTE — Progress Notes (Signed)
Pharmacy Antibiotic Note  [name] is a 66 y.o. female admitted on 04/08/2017 with cellulitis.  Pharmacy has been consulted for vancomycin and Zosyn dosing.  Plan: DW 68kg  Vd 48L kei 0.03 hr-1  T1/2 23 hours Vancomycin 750 mg q 24 hours ordered with stacked dosing. Level before 5th dose. Goal trough 15-20.  Zosyn 3.375 grams q 8 hours ordered.  Height: 5\' 5"  (165.1 cm) Weight: 190 lb (86.2 kg) IBW/kg (Calculated) : 57  Temp (24hrs), Avg:97.7 F (36.5 C), Min:97.7 F (36.5 C), Max:97.7 F (36.5 C)   Recent Labs Lab 04/08/17 0139 04/08/17 0204  WBC 13.2*  --   CREATININE 1.94*  --   LATICACIDVEN  --  5.2*    Estimated Creatinine Clearance: 31.4 mL/min (A) (by C-G formula based on SCr of 1.94 mg/dL (H)).    No Known Allergies  Antimicrobials this admission: vancomcyin Zosyn 7/1 >>    >>   Dose adjustments this admission: 7/1  Microbiology results: 7/1 BCx: pending 7/1 UCx: pending       7/1 UA: pending 7/1 CXR R base opacity Thank you for allowing pharmacy to be a part of this patient's care.  Joby Hershkowitz S 04/08/2017 3:32 AM

## 2017-04-08 NOTE — ED Notes (Addendum)
EMS 500cc NS continuing to run, per Dr. Beather Arbour okay to complete

## 2017-04-08 NOTE — Progress Notes (Signed)
   04/08/17 1815  Vitals  BP (!) 83/54  MAP (mmHg) 65  Pulse Rate 97  ECG Heart Rate 99  Resp (!) 30  Oxygen Therapy  SpO2 95 %  O2 Device Room Air    Quad strength neo @ 40 mcg

## 2017-04-08 NOTE — Consult Note (Signed)
PULMONARY / CRITICAL CARE MEDICINE   Name: [name] MRN: [id number] DOB: Jun 04, 1951    Cc: sepsis  HISTORY OF PRESENT ILLNESS:   31F with PMH significant for Afib and HTN admitted to the hospital with lower extremity edema and swelling. As per husband he noticed purple discolouration of left leg yesterday. Decided to call EMS. She has been having lower extremity edema for last few days. Denies any chest pain or worsening shortness of breath. Patient eveluated in ER and started on vasopressors for hypotension. + diarrhea. No abdominal pain or productive cough. Lower extremity doppler negative for DVT. Elevated lactate.  PAST MEDICAL HISTORY :   has a past medical history of Hypertension and Thyroid disease.    has a past surgical history that includes Wrist arthroscopy.   Prior to Admission medications   Medication Sig Start Date End Date Taking? Authorizing Provider  ALPRAZolam Duanne Moron) 0.25 MG tablet Take 0.25 mg by mouth at bedtime as needed for anxiety.   Yes [provider]  dabigatran (PRADAXA) 150 MG CAPS capsule Take 150 mg by mouth 2 (two) times daily.   Yes [provider]  diltiazem (TIAZAC) 180 MG 24 hr capsule Take 180 mg by mouth 2 (two) times daily.   Yes [provider]  furosemide (LASIX) 20 MG tablet Take 20 mg by mouth.   Yes [provider]  levothyroxine (SYNTHROID, LEVOTHROID) 50 MCG tablet Take 50 mcg by mouth daily before breakfast.   Yes [provider]  metoprolol tartrate (LOPRESSOR) 50 MG tablet Take 50 mg by mouth 2 (two) times daily.   Yes [provider]  sertraline (ZOLOFT) 25 MG tablet Take 25 mg by mouth daily.   Yes [provider]   No Known Allergies    VITAL SIGNS: Temp:  [97.7 F (36.5 C)-98.6 F (37 C)] 98 F (36.7 C) (07/01 0730) Pulse Rate:  [41-167] 66 (07/01 1115) Resp:  [16-36] 31 (07/01 1115) BP: (62-135)/(39-109) 135/109 (07/01 1100) SpO2:  [64 %-100 %] 64 % (07/01  1115) Weight:  [175 lb 4.3 oz (79.5 kg)-190 lb (86.2 kg)] 175 lb 4.3 oz (79.5 kg) (07/01 0648)    INTAKE / OUTPUT:  Intake/Output Summary (Last 24 hours) at 04/08/17 1257 Last data filed at 04/08/17 1100  Gross per 24 hour  Intake             1241 ml  Output                0 ml  Net             1241 ml    PHYSICAL EXAMINATION: General:  nad Neuro:  Aox3, non-focal HEENT:  No swelling of uvula or tongue Cardiovascular:  s1s2+ Lungs:  Decreased air entry b/l Abdomen:  Soft, nontender, no organomegaly, bs+ Skin:  Bullous lesion noted on left leg  LABS:  CBC  Recent Labs Lab 04/08/17 0139  WBC 13.2*  HGB 15.0  HCT 45.2  PLT 142*   Coag's No results for input(s): APTT, INR in the last 168 hours. BMET  Recent Labs Lab 04/08/17 0139  NA 142  K 2.7*  CL 104  CO2 25  BUN 24*  CREATININE 1.94*  GLUCOSE 77   Electrolytes  Recent Labs Lab 04/08/17 0139  CALCIUM 7.8*   Sepsis Markers  Recent Labs Lab 04/08/17 0204 04/08/17 0509  LATICACIDVEN 5.2* 4.5*   ABG No results for input(s): PHART, PCO2ART, PO2ART in the last 168 hours. Liver Enzymes  Recent Labs Lab 04/08/17 0139  AST 46*  ALT 44  ALKPHOS 166*  BILITOT 1.8*  ALBUMIN 2.8*   Cardiac Enzymes  Recent Labs Lab 04/08/17 0139  TROPONINI 0.05*   Glucose  Recent Labs Lab 04/08/17 0637 04/08/17 0719  GLUCAP 65 65    Imaging [place] Chest IMPRESSION: Small to moderate RIGHT pleural effusion.  US Venous Img Lower Bilateral IMPRESSION: No evidence of DVT within either lower extremity. Mild study limitations in the calf bilaterally due to edema.    ASSESSMENT: SEPTIC SHOCK ? SOURCE CELLULITIS ACUTE KIDNEY INJURY HYPOKALEMIA LACTIC ACIDOSIS AFIB HYPOTHYROIDISM PLEURAL EFFUSION   PLAN Vasopressors to maintain MAP >65. Broad spectrum antibiotics. Echo and cultures pending. Check cortisol Monitor UOP /creatinine closely. Replace lytes as needed.  On anticoagulation.  Wound care  consult. DVT prophylaxis. Full Code. Husband updated at bedside.  Cc:32 minutes  Dimas Chyle MD Pulmonary and Critical Care Medicine    04/08/2017, 12:57 PM

## 2017-04-08 NOTE — Consult Note (Signed)
[facility] Nurse wound consult note Reason for Consult: Second request for consultation received.  Patient assessed this morning, guidence for Nursing staff provided via the orders. Please see Consult note written at 10:16am today. [facility] nursing team will not follow, but will remain available to this patient, the nursing and medical teams.  Please re-consult if needed. Thanks, Maudie Flakes, MSN, RN, [place], Arther Abbott  Pager# [phone]

## 2017-04-08 NOTE — H&P (Addendum)
[name] is an 66 y.o. female.   Chief Complaint: Leg swelling HPI: The patient with past medical history of hypertension, A. fib and hypothyroidism presents to the emergency department complaining of leg swelling. The patient states that her lower extremities have been swelling and becoming heavier over the last 2 weeks. Tonight she noticed that her feet were red and that her legs were so heavy that she was having difficulty walking. In the emergency department the patient was found to have tachypnea and leukocytosis which prompted initiation of septic protocol. She initially received only 500 mL normal saline bolus in light of elevated BNP. Her pressure remained low (although she is usually hypotensive relative normal values) so another liter bolus was ordered prior to the emergency department staff calling for admission.  Past Medical History:  Diagnosis Date  . Hypertension   . Thyroid disease     Past Surgical History:  Procedure Laterality Date  . WRIST ARTHROSCOPY      Family History  Problem Relation Age of Onset  . AAA (abdominal aortic aneurysm) Mother    Social History:  reports that she has never smoked. She has never used smokeless tobacco. She reports that she does not drink alcohol. Her drug history is not on file.  Allergies: No Known Allergies  No prescriptions prior to admission.  Will query e-pharmacy records when system available  Results for orders placed or performed during the hospital encounter of 04/08/17 (from the past 48 hour(s))  CBC with Differential     Status: Abnormal   Collection Time: 04/08/17  1:39 AM  Result Value Ref Range   WBC 13.2 (H) 3.6 - 11.0 K/uL   RBC 4.76 3.80 - 5.20 MIL/uL   Hemoglobin 15.0 12.0 - 16.0 g/dL   HCT 45.2 35.0 - 47.0 %   MCV 95.0 80.0 - 100.0 fL   MCH 31.5 26.0 - 34.0 pg   MCHC 33.2 32.0 - 36.0 g/dL   RDW 15.5 (H) 11.5 - 14.5 %   Platelets 142 (L) 150 - 440 K/uL   Neutrophils Relative % 91 %   Neutro Abs 12.0 (H)  1.4 - 6.5 K/uL   Lymphocytes Relative 6 %   Lymphs Abs 0.7 (L) 1.0 - 3.6 K/uL   Monocytes Relative 2 %   Monocytes Absolute 0.3 0.2 - 0.9 K/uL   Eosinophils Relative 0 %   Eosinophils Absolute 0.0 0 - 0.7 K/uL   Basophils Relative 1 %   Basophils Absolute 0.1 0 - 0.1 K/uL  Comprehensive metabolic panel     Status: Abnormal   Collection Time: 04/08/17  1:39 AM  Result Value Ref Range   Sodium 142 135 - 145 mmol/L   Potassium 2.7 (LL) 3.5 - 5.1 mmol/L    Comment: CRITICAL RESULT CALLED TO, READ BACK BY AND VERIFIED WITH KENNY PATEL AT 0226 04/08/17.PMH   Chloride 104 101 - 111 mmol/L   CO2 25 22 - 32 mmol/L   Glucose, Bld 77 65 - 99 mg/dL   BUN 24 (H) 6 - 20 mg/dL   Creatinine, Ser 1.94 (H) 0.44 - 1.00 mg/dL   Calcium 7.8 (L) 8.9 - 10.3 mg/dL   Total Protein 5.5 (L) 6.5 - 8.1 g/dL   Albumin 2.8 (L) 3.5 - 5.0 g/dL   AST 46 (H) 15 - 41 U/L   ALT 44 14 - 54 U/L   Alkaline Phosphatase 166 (H) 38 - 126 U/L   Total Bilirubin 1.8 (H) 0.3 - 1.2 mg/dL  GFR calc non Af Amer 26 (L) >60 mL/min   GFR calc Af Amer 30 (L) >60 mL/min    Comment: (NOTE) The eGFR has been calculated using the CKD EPI equation. This calculation has not been validated in all clinical situations. eGFR's persistently <60 mL/min signify possible Chronic Kidney Disease.    Anion gap 13 5 - 15  Troponin I     Status: Abnormal   Collection Time: 04/08/17  1:39 AM  Result Value Ref Range   Troponin I 0.05 (HH) <0.03 ng/mL    Comment: CRITICAL RESULT CALLED TO, READ BACK BY AND VERIFIED WITH KENNY PATEL AT 0226 04/08/17.PMH  Brain natriuretic peptide     Status: Abnormal   Collection Time: 04/08/17  1:39 AM  Result Value Ref Range   B Natriuretic Peptide 2,012.0 (H) 0.0 - 100.0 pg/mL  Lactic acid, plasma     Status: Abnormal   Collection Time: 04/08/17  2:04 AM  Result Value Ref Range   Lactic Acid, Venous 5.2 (HH) 0.5 - 1.9 mmol/L    Comment: CRITICAL RESULT CALLED TO, READ BACK BY AND VERIFIED WITH KENNY PATEL AT  2409 04/08/17.PMH  Lactic acid, plasma     Status: Abnormal   Collection Time: 04/08/17  5:09 AM  Result Value Ref Range   Lactic Acid, Venous 4.5 (HH) 0.5 - 1.9 mmol/L    Comment: CRITICAL RESULT CALLED TO, READ BACK BY AND VERIFIED WITH KENNY PATEL AT 0544 04/08/17.PMH  C difficile quick scan w PCR reflex     Status: None   Collection Time: 04/08/17  5:09 AM  Result Value Ref Range   C Diff antigen NEGATIVE NEGATIVE   C Diff toxin NEGATIVE NEGATIVE   C Diff interpretation No C. difficile detected.    US Venous Img Lower Bilateral  Result Date: 04/08/2017 CLINICAL DATA:  Bilateral lower extremity swelling for 2 weeks EXAM: BILATERAL LOWER EXTREMITY VENOUS DOPPLER ULTRASOUND TECHNIQUE: Gray-scale sonography with graded compression, as well as color Doppler and duplex ultrasound were performed to evaluate the lower extremity deep venous systems from the level of the common femoral vein and including the common femoral, femoral, profunda femoral, popliteal and calf veins including the posterior tibial, peroneal and gastrocnemius veins when visible. The superficial great saphenous vein was also interrogated. Spectral Doppler was utilized to evaluate flow at rest and with distal augmentation maneuvers in the common femoral, femoral and popliteal veins. COMPARISON:  None. FINDINGS: RIGHT LOWER EXTREMITY Common Femoral Vein: No evidence of thrombus. Normal compressibility, respiratory phasicity and response to augmentation. Saphenofemoral Junction: No evidence of thrombus. Normal compressibility and flow on color Doppler imaging. Profunda Femoral Vein: No evidence of thrombus. Normal compressibility and flow on color Doppler imaging. Femoral Vein: No evidence of thrombus. Normal compressibility, respiratory phasicity and response to augmentation. Popliteal Vein: No evidence of thrombus. Normal compressibility, respiratory phasicity and response to augmentation. Calf Veins: Limited evaluation due to edema.  Superficial Great Saphenous Vein: No evidence of thrombus. Normal compressibility and flow on color Doppler imaging. Venous Reflux:  None. Other Findings:  None. LEFT LOWER EXTREMITY Common Femoral Vein: No evidence of thrombus. Normal compressibility, respiratory phasicity and response to augmentation. Saphenofemoral Junction: No evidence of thrombus. Normal compressibility and flow on color Doppler imaging. Profunda Femoral Vein: No evidence of thrombus. Normal compressibility and flow on color Doppler imaging. Femoral Vein: No evidence of thrombus. Normal compressibility, respiratory phasicity and response to augmentation. Popliteal Vein: No evidence of thrombus. Normal compressibility, respiratory phasicity and response to  augmentation. Calf Veins: Limited evaluation due to edema. Superficial Great Saphenous Vein: No evidence of thrombus. Normal compressibility and flow on color Doppler imaging. Venous Reflux:  None. Other Findings:  None. IMPRESSION: No evidence of DVT within either lower extremity. Mild study limitations in the calf bilaterally due to edema. Electronically Signed   By: Andreas Newport M.D.   On: 04/08/2017 06:22   Dg Chest Port 1 View  Result Date: 04/08/2017 CLINICAL DATA:  66 y/o  F; bilateral lower extremity edema. EXAM: PORTABLE CHEST 1 VIEW COMPARISON:  09/18/2014 chest radiograph FINDINGS: Moderate cardiomegaly. Pulmonary venous hypertension. Moderate right pleural effusion and right basilar opacity. No acute osseous abnormality is evident. IMPRESSION: Cardiomegaly. Pulmonary venous hypertension. Moderate right pleural effusion. Right basilar opacity may represent atelectasis or pneumonia. Electronically Signed   By: Kristine Garbe M.D.   On: 04/08/2017 02:20    Review of Systems  Constitutional: Negative for chills and fever.  HENT: Negative for sore throat and tinnitus.   Eyes: Negative for blurred vision and redness.  Respiratory: Negative for cough and shortness  of breath.   Cardiovascular: Positive for leg swelling. Negative for chest pain, palpitations, orthopnea and PND.  Gastrointestinal: Negative for abdominal pain, diarrhea, nausea and vomiting.  Genitourinary: Negative for dysuria, frequency and urgency.  Musculoskeletal: Negative for joint pain and myalgias.  Skin: Negative for rash.       No lesions  Neurological: Negative for speech change, focal weakness and weakness.  Endo/Heme/Allergies: Does not bruise/bleed easily.       No temperature intolerance  Psychiatric/Behavioral: Negative for depression and suicidal ideas.    Blood pressure (!) 77/47, pulse 93, temperature 98.6 F (37 C), temperature source Axillary, resp. rate (!) 22, height '5\' 5"'  (1.651 m), weight 79.5 kg (175 lb 4.3 oz), SpO2 97 %. Physical Exam  Vitals reviewed. Constitutional: She is oriented to person, place, and time. She appears well-developed and well-nourished. No distress.  HENT:  Head: Normocephalic and atraumatic.  Mouth/Throat: Oropharynx is clear and moist.  Eyes: Conjunctivae and EOM are normal. Pupils are equal, round, and reactive to light. No scleral icterus.  Neck: Normal range of motion. Neck supple. No JVD present. No tracheal deviation present. No thyromegaly present.  Cardiovascular: Normal rate, regular rhythm and normal heart sounds.  Exam reveals no gallop and no friction rub.   No murmur heard. Respiratory: Effort normal and breath sounds normal.  GI: Soft. Bowel sounds are normal. She exhibits no distension. There is no tenderness.  Genitourinary:  Genitourinary Comments: Deferred  Musculoskeletal: Normal range of motion. She exhibits edema (+3).  Lymphadenopathy:    She has no cervical adenopathy.  Neurological: She is alert and oriented to person, place, and time. No cranial nerve deficit. She exhibits normal muscle tone.  Skin: Skin is warm and dry. There is erythema.  Blistering   Psychiatric: She has a normal mood and affect. Her  behavior is normal. Judgment and thought content normal.     Assessment/Plan This is a 66 year old female admitted for sepsis. 1. Sepsis: The patient meets criteria via tachypnea and leukocytosis. She is hypotensive with elevated lactic acid which is consistent with shock, however the patient is mentating well. MAP is 60. Follow blood cultures for growth and sensitivities. Continue broad-spectrum antibiotics. 2. Cellulitis: Likely source of infection. Continue Vanco and Zosyn 3. Acute kidney injury: Likely secondary to shock. Gently hydrate normal saline. 4. Elevated troponin: Secondary to demand ischemia. The patient has an elevated BNP indicating heart failure. This  is not on her problem list the patient denies heart disease aside from atrial fibrillation. Currently rate controlled. Obtain echocardiogram. 5. Atial fibrillation: Rate controlled. The patient had been on diltiazem but will switch to valsartan by her primary care doctor. The latter may have caused a drug reaction resulting in blistering and cellulitis. Hold antihypertensive medication for now. Verified diltiazem as antiarrhythmic. Continue Pradaxa for anticoagulation. 6. Hypothyroidism: Check TSH. Resume Synthroid 7. DVT prophylaxis: As above 8. GI prophylaxis: None The patient is a full code. Time spent on admission orders and critical care approximately 45 minutes   Harrie Foreman, MD 04/08/2017, 6:59 AM

## 2017-04-08 NOTE — Progress Notes (Addendum)
ANTICOAGULATION CONSULT NOTE - Initial Consult  Pharmacy Consult for dabigatran dosing Indication: atrial fibrillation  No Known Allergies  Patient Measurements: Height: 5\' 5"  (165.1 cm) Weight: 175 lb 4.3 oz (79.5 kg) IBW/kg (Calculated) : 57 Heparin Dosing Weight: n/a  Vital Signs: Temp: 98.6 F (37 C) (07/01 0648) Temp Source: Axillary (07/01 0648) BP: 77/47 (07/01 0648) Pulse Rate: 93 (07/01 0648)  Labs:  Recent Labs  04/08/17 0139  HGB 15.0  HCT 45.2  PLT 142*  CREATININE 1.94*  TROPONINI 0.05*    Estimated Creatinine Clearance: 30.1 mL/min (A) (by C-G formula based on SCr of 1.94 mg/dL (H)).   Medical History: Past Medical History:  Diagnosis Date  . Hypertension   . Thyroid disease     Medications:    Assessment:  Goal of Therapy:      Plan:  Start dabigatran 75 mg q 12 hours for CrCl ~30. Dose may need to be adjusted as patient renal function improves. SCr at las encounter 0.62, today 1.94.  Tymara Saur S 04/08/2017,6:59 AM

## 2017-04-08 NOTE — ED Triage Notes (Signed)
Pt to [place] via EMS from home, report bilateral leg swelling x 2 weeks, was worse in left at first but right has become worse over past couple days.  Pt's left leg checked for DVT 2 weeks ago and was negative.  Pt's legs with 3+ pitting edema, weeping, left leg with redness and warmth noted

## 2017-04-08 NOTE — Progress Notes (Signed)
   04/08/17 1540  Vitals  Temp (!) 96.4 F (35.8 C)  Temp Source Axillary  BP 94/67  MAP (mmHg) 75  Pulse Rate 96  ECG Heart Rate (!) 108  Resp (!) 22  Oxygen Therapy  SpO2 92 %  O2 Device Room Air    Warming blanket applied

## 2017-04-08 NOTE — Progress Notes (Signed)
Subjective: Patient says she feels a little better since last night. However she has a little bit of slurred speech according to her husband and herself feels like she has a heavy tongue. This just started earlier today. Does not complain of any chest pain or shortness of breath.  Objective: Vital signs in last 24 hours: Temp:  [97.7 F (36.5 C)-98.6 F (37 C)] 98 F (36.7 C) (07/01 0730) Pulse Rate:  [41-167] 66 (07/01 1115) Resp:  [16-36] 31 (07/01 1115) BP: (62-135)/(39-109) 135/109 (07/01 1100) SpO2:  [64 %-100 %] 64 % (07/01 1115) Weight:  [79.5 kg (175 lb 4.3 oz)-86.2 kg (190 lb)] 79.5 kg (175 lb 4.3 oz) (07/01 4098) Weight change:  Last BM Date: (P) 04/08/17  Intake/Output from previous day: 06/30 0701 - 07/01 0700 In: 250 [IV Piggyback:250] Out: -  Intake/Output this shift: Total I/O In: 991 [P.O.:480; I.V.:461; IV Piggyback:50] Out: -   General appearance: no distress Head: Normocephalic, without obvious abnormality, atraumatic Throat: On mucosa and oropharynx are clear. Tongue appears to be slightly swollen Resp: clear to auscultation bilaterally and normal percussion bilaterally Cardio: irregularly irregular rhythm GI: soft, non-tender; bowel sounds normal; no masses,  no organomegaly Extremities: 2+ lower extremity edema bilateral. Bullous lesions bilaterally that are erythematous.  Lab Results:  Recent Labs  04/08/17 0139  WBC 13.2*  HGB 15.0  HCT 45.2  PLT 142*   BMET  Recent Labs  04/08/17 0139  NA 142  K 2.7*  CL 104  CO2 25  GLUCOSE 77  BUN 24*  CREATININE 1.94*  CALCIUM 7.8*    Studies/Results: [place] Chest  Result Date: 04/08/2017 CLINICAL DATA:  RIGHT pleural effusion EXAM: CHEST ULTRASOUND COMPARISON:  Chest radiograph 04/08/2017 FINDINGS: Small to moderate RIGHT pleural effusion identified. Patient imaged in LEFT lateral decubitus position, unable to sit. No loculations or septations identified. IMPRESSION: Small to moderate RIGHT pleural  effusion. Electronically Signed   By: Lavonia Dana M.D.   On: 04/08/2017 11:20   US Venous Img Lower Bilateral  Result Date: 04/08/2017 CLINICAL DATA:  Bilateral lower extremity swelling for 2 weeks EXAM: BILATERAL LOWER EXTREMITY VENOUS DOPPLER ULTRASOUND TECHNIQUE: Gray-scale sonography with graded compression, as well as color Doppler and duplex ultrasound were performed to evaluate the lower extremity deep venous systems from the level of the common femoral vein and including the common femoral, femoral, profunda femoral, popliteal and calf veins including the posterior tibial, peroneal and gastrocnemius veins when visible. The superficial great saphenous vein was also interrogated. Spectral Doppler was utilized to evaluate flow at rest and with distal augmentation maneuvers in the common femoral, femoral and popliteal veins. COMPARISON:  None. FINDINGS: RIGHT LOWER EXTREMITY Common Femoral Vein: No evidence of thrombus. Normal compressibility, respiratory phasicity and response to augmentation. Saphenofemoral Junction: No evidence of thrombus. Normal compressibility and flow on color Doppler imaging. Profunda Femoral Vein: No evidence of thrombus. Normal compressibility and flow on color Doppler imaging. Femoral Vein: No evidence of thrombus. Normal compressibility, respiratory phasicity and response to augmentation. Popliteal Vein: No evidence of thrombus. Normal compressibility, respiratory phasicity and response to augmentation. Calf Veins: Limited evaluation due to edema. Superficial Great Saphenous Vein: No evidence of thrombus. Normal compressibility and flow on color Doppler imaging. Venous Reflux:  None. Other Findings:  None. LEFT LOWER EXTREMITY Common Femoral Vein: No evidence of thrombus. Normal compressibility, respiratory phasicity and response to augmentation. Saphenofemoral Junction: No evidence of thrombus. Normal compressibility and flow on color Doppler imaging. Profunda Femoral Vein: No  evidence  of thrombus. Normal compressibility and flow on color Doppler imaging. Femoral Vein: No evidence of thrombus. Normal compressibility, respiratory phasicity and response to augmentation. Popliteal Vein: No evidence of thrombus. Normal compressibility, respiratory phasicity and response to augmentation. Calf Veins: Limited evaluation due to edema. Superficial Great Saphenous Vein: No evidence of thrombus. Normal compressibility and flow on color Doppler imaging. Venous Reflux:  None. Other Findings:  None. IMPRESSION: No evidence of DVT within either lower extremity. Mild study limitations in the calf bilaterally due to edema. Electronically Signed   By: Andreas Newport M.D.   On: 04/08/2017 06:22   Dg Chest Port 1 View  Result Date: 04/08/2017 CLINICAL DATA:  66 y/o  F; bilateral lower extremity edema. EXAM: PORTABLE CHEST 1 VIEW COMPARISON:  09/18/2014 chest radiograph FINDINGS: Moderate cardiomegaly. Pulmonary venous hypertension. Moderate right pleural effusion and right basilar opacity. No acute osseous abnormality is evident. IMPRESSION: Cardiomegaly. Pulmonary venous hypertension. Moderate right pleural effusion. Right basilar opacity may represent atelectasis or pneumonia. Electronically Signed   By: Kristine Garbe M.D.   On: 04/08/2017 02:20    Medications: I have reviewed the patient's current medications.  Assessment/Plan: 1. Sepsis. Likely secondary to lower extremity wound infections. Currently she is requiring pressor support for her blood pressure. She got aggressive IV fluids what appears to be getting more edema in the lower extremities from this. No sign of any pulmonary edema yet. Medical care has stopped IV fluids and continued pressors for now. 2. Lower history cellulitis. Patient is on Zosyn and finding. Cultures are pending. 3. Question will tongue swelling. She had recently been on an ARB which can occasionally cause angioedema. Medication is been stopped. We'll  monitor her tongue swelling for any worsening. 4. Acute renal failure. She's received some IV fluids. Will monitor renal function. 5. Hypokalemia. She was taking large amounts of Lasix to try to get lower extremity swelling down as an outpatient. Likely has depleted her potassium. We'll replete this aggressively. 6. Lower extremity edema. Not sure what precipitated this. Her she relates this to starting the ARB recently. Echocardiogram pending. 7. Atrial fibrillation. She's on diltiazem for rate control. She's on full anticoagulation.  Total time spent 30 minutes  LOS: 0 days   Baxter Hire 04/08/2017, 12:07 PM

## 2017-04-08 NOTE — ED Notes (Signed)
Pt states "I've had an accident, and its a bad one"; This RN and 2 techs, cleaned the patient; pt placed with briefs; full linen change, chux pads placed under the patient; stool sample collected for c-diff scan; pt placed back on the monitor, and IVs restarted.

## 2017-04-08 NOTE — Consult Note (Signed)
[facility] CONSULT NOTE    Date: 04/08/2017                  Patient Name:  [name]  MRN: [id number]  DOB: [birth year]  Age / Sex: 66 y.o., female         PCP: Tracie Harrier, MD                 Service Requesting Consult: Critical Care                 Reason for Consult: Acute renal failure            History of Present Illness: Patient is a 66 y.o. female with a PMHx of Hypertension, hypothyroidism, atrial fibrillation, who was admitted to [facility] on 04/08/2017 for evaluation of increasing lower extremity edema. Patient reports that over the past 2 weeks she's had progressively worsening lower extremity edema. She also recently developed a bullous overlying her left lower extremity. When she presented to the emergency department she was found have tachypnea, leukocytosis and sepsis protocol was initiated. However she had quite an elevated BNP and also had cardiomegaly as well as pleural effusion on chest x-ray. 2-D echocardiogram result is currently pending.  The patient's most recent baseline creatinine on file is from 09/19/2014 at which point in time creatinine was 0.62. Now upon presentation creatinine is 1.94. Patient also has an elevated lactic acid level of 4.5. Initially lactic acid was found to be 5.2.  Patient also appears to have hypokalemia.   Medications: Outpatient medications: Prescriptions Prior to Admission  Medication Sig Dispense Refill Last Dose  . ALPRAZolam (XANAX) 0.25 MG tablet Take 0.25 mg by mouth at bedtime as needed for anxiety.   unknown at unknown  . dabigatran (PRADAXA) 150 MG CAPS capsule Take 150 mg by mouth 2 (two) times daily.   unknown at unknown  . diltiazem (TIAZAC) 180 MG 24 hr capsule Take 180 mg by mouth 2 (two) times daily.   unknown at unknown  . furosemide (LASIX) 20 MG tablet Take 20 mg by mouth.   unknown at unknown  . levothyroxine (SYNTHROID, LEVOTHROID) 50 MCG tablet Take 50 mcg by mouth daily before breakfast.    unknown at unknown  . metoprolol tartrate (LOPRESSOR) 50 MG tablet Take 50 mg by mouth 2 (two) times daily.   unknown  . sertraline (ZOLOFT) 25 MG tablet Take 25 mg by mouth daily.   unknown at unknown    Current medications: Current Facility-Administered Medications  Medication Dose Route Frequency Provider Last Rate Last Dose  . 0.9 %  sodium chloride infusion   Intravenous Continuous Harrie Foreman, MD 125 mL/hr at 04/08/17 [phone]    . acetaminophen (TYLENOL) tablet 650 mg  650 mg Oral Q6H PRN Harrie Foreman, MD       Or  . acetaminophen (TYLENOL) suppository 650 mg  650 mg Rectal Q6H PRN Harrie Foreman, MD      . dabigatran (PRADAXA) capsule 75 mg  75 mg Oral Q12H Harrie Foreman, MD   75 mg at 04/08/17 0806  . docusate sodium (COLACE) capsule 100 mg  100 mg Oral BID Harrie Foreman, MD   100 mg at 04/08/17 1324  . ondansetron (ZOFRAN) tablet 4 mg  4 mg Oral Q6H PRN Harrie Foreman, MD       Or  . ondansetron [facility]) injection 4 mg  4 mg Intravenous Q6H PRN Harrie Foreman, MD      .  phenylephrine (NEO-SYNEPHRINE) 10 mg in sodium chloride 0.9 % 250 mL (0.04 mg/mL) infusion  0-200 mcg/min Intravenous Titrated Rigoberto Noel, MD 165 mL/hr at 04/08/17 1505 110 mcg/min at 04/08/17 1505  . piperacillin-tazobactam (ZOSYN) IVPB 3.375 g  3.375 g Intravenous Q8H Paulette Blanch, MD   Stopped at 04/08/17 1204  . vancomycin (VANCOCIN) IVPB 750 mg/150 ml premix  750 mg Intravenous Q24H Paulette Blanch, MD   Stopped at 04/08/17 1523  . zinc oxide (BALMEX) 11.3 % cream   Topical QID PRN Lenis Noon, RPH          Allergies: No Known Allergies    Past Medical History: Past Medical History:  Diagnosis Date  . Hypertension   . Thyroid disease      Past Surgical History: Past Surgical History:  Procedure Laterality Date  . WRIST ARTHROSCOPY       Family History: Family History  Problem Relation Age of Onset  . AAA (abdominal aortic aneurysm) Mother      Social  History: Social History   Social History  . Marital status: Married    Spouse name: N/A  . Number of children: N/A  . Years of education: N/A   Occupational History  . Not on file.   Social History Main Topics  . Smoking status: Never Smoker  . Smokeless tobacco: Never Used  . Alcohol use No  . Drug use: Unknown  . Sexual activity: Not on file   Other Topics Concern  . Not on file   Social History Narrative  . No narrative on file     Review of Systems: As per HPI  Vital Signs: Blood pressure 100/79, pulse (!) 104, temperature (!) 96.4 F (35.8 C), temperature source Axillary, resp. rate (!) 29, height 5\' 5"  (1.651 m), weight 79.5 kg (175 lb 4.3 oz), SpO2 (!) 89 %.  Weight trends: Filed Weights   04/08/17 0120 04/08/17 0648  Weight: 86.2 kg (190 lb) 79.5 kg (175 lb 4.3 oz)    Physical Exam: General: Critically ill appearing  Head: Normocephalic, atraumatic.  Eyes: Anicteric, EOMI  Nose: Mucous membranes moist, not inflammed, nonerythematous.  Throat: Oropharynx nonerythematous, no exudate appreciated.   Neck: Supple, trachea midline.  Lungs:  Scattered rhonchi, slight tachypnea  Heart: S1S2 irregular.  Abdomen:  BS normoactive. Soft, Nondistended, non-tender.  No masses or organomegaly.  Extremities: 2+ edema bilateral, large bullous noted on LLE  Neurologic: Awake, alert, follows commands  Skin: Areas of reddness mixed with bruising LLE.     Lab results: Basic Metabolic Panel:  Recent Labs Lab 04/08/17 0139 04/08/17 1300  NA 142  --   K 2.7* 3.0*  CL 104  --   CO2 25  --   GLUCOSE 77  --   BUN 24*  --   CREATININE 1.94*  --   CALCIUM 7.8*  --     Liver Function Tests:  Recent Labs Lab 04/08/17 0139  AST 46*  ALT 44  ALKPHOS 166*  BILITOT 1.8*  PROT 5.5*  ALBUMIN 2.8*   No results for input(s): LIPASE, AMYLASE in the last 168 hours. No results for input(s): AMMONIA in the last 168 hours.  CBC:  Recent Labs Lab 04/08/17 0139   WBC 13.2*  NEUTROABS 12.0*  HGB 15.0  HCT 45.2  MCV 95.0  PLT 142*    Cardiac Enzymes:  Recent Labs Lab 04/08/17 0139  TROPONINI 0.05*    BNP: Invalid input(s): POCBNP  CBG:  Recent Labs  Lab 04/08/17 0637 04/08/17 0719  GLUCAP 65 44    Microbiology: Results for orders placed or performed during the hospital encounter of 04/08/17  Culture, blood (routine x 2)     Status: None (Preliminary result)   Collection Time: 04/08/17  2:04 AM  Result Value Ref Range Status   Specimen Description BLOOD RIGHT ANTECUBITAL  Final   Special Requests   Final    BOTTLES DRAWN AEROBIC AND ANAEROBIC Blood Culture adequate volume   Culture NO GROWTH < 12 HOURS  Final   Report Status PENDING  Incomplete  Culture, blood (routine x 2)     Status: None (Preliminary result)   Collection Time: 04/08/17  2:06 AM  Result Value Ref Range Status   Specimen Description BLOOD LEFT ANTECUBITAL  Final   Special Requests   Final    BOTTLES DRAWN AEROBIC AND ANAEROBIC Blood Culture adequate volume   Culture NO GROWTH < 12 HOURS  Final   Report Status PENDING  Incomplete  C difficile quick scan w PCR reflex     Status: None   Collection Time: 04/08/17  5:09 AM  Result Value Ref Range Status   C Diff antigen NEGATIVE NEGATIVE Final   C Diff toxin NEGATIVE NEGATIVE Final   C Diff interpretation No C. difficile detected.  Final  MRSA PCR Screening     Status: None   Collection Time: 04/08/17  6:50 AM  Result Value Ref Range Status   MRSA by PCR NEGATIVE NEGATIVE Final    Comment:        The GeneXpert MRSA Assay (FDA approved for NASAL specimens only), is one component of a comprehensive MRSA colonization surveillance program. It is not intended to diagnose MRSA infection nor to guide or monitor treatment for MRSA infections.     Coagulation Studies:  Recent Labs  04/08/17 1217  LABPROT 58.1*  INR 6.37*    Urinalysis: No results for input(s): COLORURINE, LABSPEC, PHURINE,  GLUCOSEU, HGBUR, BILIRUBINUR, KETONESUR, PROTEINUR, UROBILINOGEN, NITRITE, LEUKOCYTESUR in the last 72 hours.  Invalid input(s): APPERANCEUR    Imaging: [place] Chest  Result Date: 04/08/2017 CLINICAL DATA:  RIGHT pleural effusion EXAM: CHEST ULTRASOUND COMPARISON:  Chest radiograph 04/08/2017 FINDINGS: Small to moderate RIGHT pleural effusion identified. Patient imaged in LEFT lateral decubitus position, unable to sit. No loculations or septations identified. IMPRESSION: Small to moderate RIGHT pleural effusion. Electronically Signed   By: Lavonia Dana M.D.   On: 04/08/2017 11:20   US Venous Img Lower Bilateral  Result Date: 04/08/2017 CLINICAL DATA:  Bilateral lower extremity swelling for 2 weeks EXAM: BILATERAL LOWER EXTREMITY VENOUS DOPPLER ULTRASOUND TECHNIQUE: Gray-scale sonography with graded compression, as well as color Doppler and duplex ultrasound were performed to evaluate the lower extremity deep venous systems from the level of the common femoral vein and including the common femoral, femoral, profunda femoral, popliteal and calf veins including the posterior tibial, peroneal and gastrocnemius veins when visible. The superficial great saphenous vein was also interrogated. Spectral Doppler was utilized to evaluate flow at rest and with distal augmentation maneuvers in the common femoral, femoral and popliteal veins. COMPARISON:  None. FINDINGS: RIGHT LOWER EXTREMITY Common Femoral Vein: No evidence of thrombus. Normal compressibility, respiratory phasicity and response to augmentation. Saphenofemoral Junction: No evidence of thrombus. Normal compressibility and flow on color Doppler imaging. Profunda Femoral Vein: No evidence of thrombus. Normal compressibility and flow on color Doppler imaging. Femoral Vein: No evidence of thrombus. Normal compressibility, respiratory phasicity and response to augmentation. Popliteal Vein:  No evidence of thrombus. Normal compressibility, respiratory phasicity and  response to augmentation. Calf Veins: Limited evaluation due to edema. Superficial Great Saphenous Vein: No evidence of thrombus. Normal compressibility and flow on color Doppler imaging. Venous Reflux:  None. Other Findings:  None. LEFT LOWER EXTREMITY Common Femoral Vein: No evidence of thrombus. Normal compressibility, respiratory phasicity and response to augmentation. Saphenofemoral Junction: No evidence of thrombus. Normal compressibility and flow on color Doppler imaging. Profunda Femoral Vein: No evidence of thrombus. Normal compressibility and flow on color Doppler imaging. Femoral Vein: No evidence of thrombus. Normal compressibility, respiratory phasicity and response to augmentation. Popliteal Vein: No evidence of thrombus. Normal compressibility, respiratory phasicity and response to augmentation. Calf Veins: Limited evaluation due to edema. Superficial Great Saphenous Vein: No evidence of thrombus. Normal compressibility and flow on color Doppler imaging. Venous Reflux:  None. Other Findings:  None. IMPRESSION: No evidence of DVT within either lower extremity. Mild study limitations in the calf bilaterally due to edema. Electronically Signed   By: Andreas Newport M.D.   On: 04/08/2017 06:22   Dg Chest Port 1 View  Result Date: 04/08/2017 CLINICAL DATA:  66 y/o  F; bilateral lower extremity edema. EXAM: PORTABLE CHEST 1 VIEW COMPARISON:  09/18/2014 chest radiograph FINDINGS: Moderate cardiomegaly. Pulmonary venous hypertension. Moderate right pleural effusion and right basilar opacity. No acute osseous abnormality is evident. IMPRESSION: Cardiomegaly. Pulmonary venous hypertension. Moderate right pleural effusion. Right basilar opacity may represent atelectasis or pneumonia. Electronically Signed   By: Kristine Garbe M.D.   On: 04/08/2017 02:20      Assessment & Plan: Pt is a 66 y.o. female with a PMHx of Hypertension, hypothyroidism, atrial fibrillation, who was admitted to [facility] on  04/08/2017 for evaluation of increasing lower extremity edema.  1.  Acute renal failure due to hypotension and contrast exposure on 04/04/17.  2.  Acidosis, lactic in nature. 3.  Hypotension, ? Septic vs cardiogenic shock vs both. 4.  Lower extremity edema with bullous in LLE. 5.  Hypokalemia.   Plan:  Patient presents with either septic versus cardiogenic shock. She does have signs and symptoms of infection particularly in the left lower extremity however patient has cardiomegaly along with pleural effusions which suggests that she could potentially have cardiogenic shock as well. Agree with broad-spectrum antibiotics. Hold off on further aggressive IV fluid hydration given the cardiomegaly and pleural effusions. Patient has received boluses previously. Continue with pressor therapy to maintain a map of 65. No urgent indication for dialysis at the moment however this could potentially be considered if renal function continues to worsen. We will proceed with additional workup including SPEP, UPEP, ANA, ANCA antibodies, GBM antibodies, C3, C4, and renal US.  Replete K as per pulmonary critical care. Patient with guarded prognosis.

## 2017-04-08 NOTE — Progress Notes (Signed)
   04/08/17 1700  Vitals  Temp 97.4 F (36.3 C)  Temp Source Oral  BP (!) 96/59  MAP (mmHg) 67  Pulse Rate 97  ECG Heart Rate (!) 101  Resp (!) 26  Oxygen Therapy  SpO2 96 %     Warming blanket turned off

## 2017-04-08 NOTE — ED Provider Notes (Signed)
[facility] Emergency Department Provider Note   ____________________________________________   First MD Initiated Contact with Patient 04/08/17 0145     (approximate)  I have reviewed the triage vital signs and the nursing notes.   HISTORY  Chief Complaint Leg Swelling    HPI [name] is a 66 y.o. female brought to the ED from home via EMS with a chief complaint of bilateral leg swelling. Patient reports swelling of bilateral legs for the past 2 weeks. At first her PCP thought swelling was due to diltiazem so the patient was taken off.She was given Lasix without improvement of symptoms. Had an ultrasound of both legs 2 weeks ago which was negative for DVT. States she no longer can walk secondary to swelling in her legs. Denies associated fever, chills, chest pain, shortness of breath, abdominal pain, nausea, vomiting. Denies recent travel or trauma. Nothing makes her symptoms better or worse.   Past Medical History:  Diagnosis Date  . Hypertension   . Thyroid disease     Patient Active Problem List   Diagnosis Date Noted  . Septic shock ([place]) 04/08/2017    History reviewed. No pertinent surgical history.  Prior to Admission medications   Not on File    Allergies Patient has no known allergies.  History reviewed. No pertinent family history.  Social History Social History  Substance Use Topics  . Smoking status: Never Smoker  . Smokeless tobacco: Never Used  . Alcohol use No    Review of Systems  Constitutional: No fever/chills. Eyes: No visual changes. ENT: No sore throat. Cardiovascular: Denies chest pain. Respiratory: Denies shortness of breath. Gastrointestinal: No abdominal pain.  No nausea, no vomiting.  No diarrhea.  No constipation. Genitourinary: Negative for dysuria. Musculoskeletal: Positive for bilateral lower leg edema. Negative for back pain. Skin: Negative for rash. Neurological: Negative for headaches, focal  weakness or numbness.   ____________________________________________   PHYSICAL EXAM:  VITAL SIGNS: ED Triage Vitals [04/08/17 0120]  Enc Vitals Group     BP (!) 65/39     Pulse Rate 72     Resp 16     Temp 97.7 F (36.5 C)     Temp Source Oral     SpO2 97 %     Weight 190 lb (86.2 kg)     Height 5\' 5"  (1.651 m)     Head Circumference      Peak Flow      Pain Score      Pain Loc      Pain Edu?      Excl. in [place]?     Constitutional: Alert and oriented. Well appearing and in mild acute distress. Eyes: Conjunctivae are normal.  Head: Atraumatic. Nose: No congestion/rhinnorhea. Mouth/Throat: Mucous membranes are moist.  Oropharynx non-erythematous. Neck: No stridor.   Cardiovascular: Normal rate, regular rhythm. Grossly normal heart sounds.  Good peripheral circulation. Respiratory: Normal respiratory effort.  No retractions. Lungs CTAB. Gastrointestinal: Soft and nontender. No distention. No abdominal bruits. No CVA tenderness. Musculoskeletal: BLE with 3+ pitting edema. Blisters in various stages of weeping and rupture. Scattered excoriations. Warmth and erythema suggestive of cellulitis.  Neurologic:  Normal speech and language. No gross focal neurologic deficits are appreciated.  Skin:  Skin is warm and dry. No rash noted. Otherwise as noted above. Psychiatric: Mood and affect are normal. Speech and behavior are normal.  ____________________________________________   LABS (all labs ordered are listed, but only abnormal results are displayed)  Labs Reviewed  CBC WITH DIFFERENTIAL/PLATELET - Abnormal; Notable for the following:       Result Value   WBC 13.2 (*)    RDW 15.5 (*)    Platelets 142 (*)    Neutro Abs 12.0 (*)    Lymphs Abs 0.7 (*)    All other components within normal limits  COMPREHENSIVE METABOLIC PANEL - Abnormal; Notable for the following:    Potassium 2.7 (*)    BUN 24 (*)    Creatinine, Ser 1.94 (*)    Calcium 7.8 (*)    Total Protein 5.5 (*)     Albumin 2.8 (*)    AST 46 (*)    Alkaline Phosphatase 166 (*)    Total Bilirubin 1.8 (*)    GFR calc non Af Amer 26 (*)    GFR calc Af Amer 30 (*)    All other components within normal limits  TROPONIN I - Abnormal; Notable for the following:    Troponin I 0.05 (*)    All other components within normal limits  LACTIC ACID, PLASMA - Abnormal; Notable for the following:    Lactic Acid, Venous 5.2 (*)    All other components within normal limits  BRAIN NATRIURETIC PEPTIDE - Abnormal; Notable for the following:    B Natriuretic Peptide 2,012.0 (*)    All other components within normal limits  CULTURE, BLOOD (ROUTINE X 2)  CULTURE, BLOOD (ROUTINE X 2)  URINE CULTURE  C DIFFICILE QUICK SCREEN W PCR REFLEX  LACTIC ACID, PLASMA  URINALYSIS, COMPLETE (UACMP) WITH MICROSCOPIC   ____________________________________________  EKG  ED ECG REPORT I, SUNG,JADE J, the attending physician, personally viewed and interpreted this ECG.   Date: 04/08/2017  EKG Time: 0137  Rate: 77  Rhythm: atrial fibrillation, rate 77  Axis: Normal  Intervals:none  ST&T Change: Nonspecific  ____________________________________________  RADIOLOGY  Dg Chest Port 1 View  Result Date: 04/08/2017 CLINICAL DATA:  66 y/o  F; bilateral lower extremity edema. EXAM: PORTABLE CHEST 1 VIEW COMPARISON:  09/18/2014 chest radiograph FINDINGS: Moderate cardiomegaly. Pulmonary venous hypertension. Moderate right pleural effusion and right basilar opacity. No acute osseous abnormality is evident. IMPRESSION: Cardiomegaly. Pulmonary venous hypertension. Moderate right pleural effusion. Right basilar opacity may represent atelectasis or pneumonia. Electronically Signed   By: Kristine Garbe M.D.   On: 04/08/2017 02:20    ____________________________________________   PROCEDURES  Procedure(s) performed: None  Procedures  Critical Care performed: Yes, see critical care note(s)   CRITICAL CARE Performed  by: Paulette Blanch   Total critical care time: 30 minutes  Critical care time was exclusive of separately billable procedures and treating other patients.  Critical care was necessary to treat or prevent imminent or life-threatening deterioration.  Critical care was time spent personally by me on the following activities: development of treatment plan with patient and/or surrogate as well as nursing, discussions with consultants, evaluation of patient's response to treatment, examination of patient, obtaining history from patient or surrogate, ordering and performing treatments and interventions, ordering and review of laboratory studies, ordering and review of radiographic studies, pulse oximetry and re-evaluation of patient's condition.  ____________________________________________   INITIAL IMPRESSION / ASSESSMENT AND PLAN / ED COURSE  Pertinent labs & imaging results that were available during my care of the patient were reviewed by me and considered in my medical decision making (see chart for details).  66 year old female with significant BLE swelling and weeping. Will obtain screening lab work including blood culture, repeat ultrasound to evaluate DVT. Small fluid  bolus for blood pressure. Anticipate hospitalization.  Clinical Course as of Apr 09 503  Nancy Fetter Apr 08, 2017  0311 Notified of elevated lactate. ED code sepsis initiated. Judicious fluids administered given degree of patient's edema and right pleural effusion. BP marginally improved. Will discuss with hospitalist to evaluate patient in the emergency department for admission.  [JS]    Clinical Course User Index [JS] Paulette Blanch, MD     ____________________________________________   FINAL CLINICAL IMPRESSION(S) / ED DIAGNOSES  Final diagnoses:  Peripheral edema  Hypokalemia  Cellulitis of lower extremity, unspecified laterality  Sepsis, due to unspecified organism ([place])  Pleural effusion      NEW MEDICATIONS  STARTED DURING THIS VISIT:  New Prescriptions   No medications on file     Note:  This document was prepared using Dragon voice recognition software and may include unintentional dictation errors.    Paulette Blanch, MD 04/08/17 [phone]

## 2017-04-08 NOTE — Progress Notes (Signed)
   04/08/17 1300  Urine Characteristics  Bladder Scan Volume (mL) 16 mL     Pt has not voided since start of the shift

## 2017-04-09 ENCOUNTER — Encounter: Payer: Self-pay | Admitting: Adult Health

## 2017-04-09 ENCOUNTER — Inpatient Hospital Stay: Payer: Medicare Other

## 2017-04-09 DIAGNOSIS — E8770 Fluid overload, unspecified: Secondary | ICD-10-CM

## 2017-04-09 DIAGNOSIS — R600 Localized edema: Secondary | ICD-10-CM

## 2017-04-09 DIAGNOSIS — N189 Chronic kidney disease, unspecified: Secondary | ICD-10-CM

## 2017-04-09 LAB — RENAL FUNCTION PANEL
ALBUMIN: 2.1 g/dL — AB (ref 3.5–5.0)
Albumin: 2.2 g/dL — ABNORMAL LOW (ref 3.5–5.0)
Anion gap: 10 (ref 5–15)
Anion gap: 8 (ref 5–15)
BUN: 34 mg/dL — ABNORMAL HIGH (ref 6–20)
BUN: 34 mg/dL — AB (ref 6–20)
CALCIUM: 6.9 mg/dL — AB (ref 8.9–10.3)
CALCIUM: 6.9 mg/dL — AB (ref 8.9–10.3)
CO2: 19 mmol/L — ABNORMAL LOW (ref 22–32)
CO2: 20 mmol/L — ABNORMAL LOW (ref 22–32)
CREATININE: 2.07 mg/dL — AB (ref 0.44–1.00)
CREATININE: 2.03 mg/dL — AB (ref 0.44–1.00)
Chloride: 113 mmol/L — ABNORMAL HIGH (ref 101–111)
Chloride: 113 mmol/L — ABNORMAL HIGH (ref 101–111)
GFR calc Af Amer: 28 mL/min — ABNORMAL LOW (ref >60)
GFR calc non Af Amer: 24 mL/min — ABNORMAL LOW (ref >60)
GFR calc non Af Amer: 25 mL/min — ABNORMAL LOW (ref >60)
GFR, EST AFRICAN AMERICAN: 28 mL/min — AB (ref >60)
GLUCOSE: 195 mg/dL — AB (ref 65–99)
Glucose, Bld: 185 mg/dL — ABNORMAL HIGH (ref 65–99)
PHOSPHORUS: 2.1 mg/dL — AB (ref 2.5–4.6)
PHOSPHORUS: 1.8 mg/dL — AB (ref 2.5–4.6)
Potassium: 3.3 mmol/L — ABNORMAL LOW (ref 3.5–5.1)
Potassium: 3.1 mmol/L — ABNORMAL LOW (ref 3.5–5.1)
SODIUM: 142 mmol/L (ref 135–145)
SODIUM: 141 mmol/L (ref 135–145)

## 2017-04-09 LAB — GASTROINTESTINAL PANEL BY PCR, STOOL (REPLACES STOOL CULTURE)
ASTROVIRUS: NOT DETECTED
Adenovirus F40/41: NOT DETECTED
CAMPYLOBACTER SPECIES: NOT DETECTED
CYCLOSPORA CAYETANENSIS: NOT DETECTED
Cryptosporidium: NOT DETECTED
ENTEROTOXIGENIC E COLI (ETEC): NOT DETECTED
Entamoeba histolytica: NOT DETECTED
Enteroaggregative E coli (EAEC): NOT DETECTED
Enteropathogenic E coli (EPEC): NOT DETECTED
Giardia lamblia: NOT DETECTED
NOROVIRUS GI/GII: NOT DETECTED
PLESIMONAS SHIGELLOIDES: NOT DETECTED
Rotavirus A: NOT DETECTED
SALMONELLA SPECIES: NOT DETECTED
SAPOVIRUS (I, II, IV, AND V): NOT DETECTED
SHIGA LIKE TOXIN PRODUCING E COLI (STEC): NOT DETECTED
SHIGELLA/ENTEROINVASIVE E COLI (EIEC): NOT DETECTED
Vibrio cholerae: NOT DETECTED
Vibrio species: NOT DETECTED
Yersinia enterocolitica: NOT DETECTED

## 2017-04-09 LAB — BASIC METABOLIC PANEL
ANION GAP: 10 (ref 5–15)
Anion gap: 10 (ref 5–15)
Anion gap: 11 (ref 5–15)
BUN: 32 mg/dL — ABNORMAL HIGH (ref 6–20)
BUN: 35 mg/dL — ABNORMAL HIGH (ref 6–20)
BUN: 31 mg/dL — AB (ref 6–20)
CALCIUM: 7 mg/dL — AB (ref 8.9–10.3)
CALCIUM: 6.7 mg/dL — AB (ref 8.9–10.3)
CALCIUM: 6.9 mg/dL — AB (ref 8.9–10.3)
CHLORIDE: 112 mmol/L — AB (ref 101–111)
CO2: 22 mmol/L (ref 22–32)
CO2: 22 mmol/L (ref 22–32)
CO2: 19 mmol/L — ABNORMAL LOW (ref 22–32)
CREATININE: 2.28 mg/dL — AB (ref 0.44–1.00)
CREATININE: 2.01 mg/dL — AB (ref 0.44–1.00)
CREATININE: 2.18 mg/dL — AB (ref 0.44–1.00)
Chloride: 110 mmol/L (ref 101–111)
Chloride: 107 mmol/L (ref 101–111)
GFR calc Af Amer: 29 mL/min — ABNORMAL LOW (ref >60)
GFR calc Af Amer: 26 mL/min — ABNORMAL LOW (ref >60)
GFR calc non Af Amer: 21 mL/min — ABNORMAL LOW (ref >60)
GFR, EST AFRICAN AMERICAN: 25 mL/min — AB (ref >60)
GFR, EST NON AFRICAN AMERICAN: 23 mL/min — AB (ref >60)
GFR, EST NON AFRICAN AMERICAN: 25 mL/min — AB (ref >60)
GLUCOSE: 64 mg/dL — AB (ref 65–99)
Glucose, Bld: 71 mg/dL (ref 65–99)
Glucose, Bld: 185 mg/dL — ABNORMAL HIGH (ref 65–99)
POTASSIUM: 3.4 mmol/L — AB (ref 3.5–5.1)
Potassium: 3.9 mmol/L (ref 3.5–5.1)
Potassium: 3.3 mmol/L — ABNORMAL LOW (ref 3.5–5.1)
SODIUM: 142 mmol/L (ref 135–145)
SODIUM: 142 mmol/L (ref 135–145)
Sodium: 139 mmol/L (ref 135–145)

## 2017-04-09 LAB — CBC
HCT: 39 % (ref 35.0–47.0)
HCT: 41.6 % (ref 35.0–47.0)
Hemoglobin: 12.9 g/dL (ref 12.0–16.0)
Hemoglobin: 13.7 g/dL (ref 12.0–16.0)
MCH: 31.8 pg (ref 26.0–34.0)
MCH: 31.3 pg (ref 26.0–34.0)
MCHC: 32.9 g/dL (ref 32.0–36.0)
MCHC: 32.9 g/dL (ref 32.0–36.0)
MCV: 94.9 fL (ref 80.0–100.0)
MCV: 96.5 fL (ref 80.0–100.0)
PLATELETS: 115 10*3/uL — AB (ref 150–440)
PLATELETS: 91 10*3/uL — AB (ref 150–440)
RBC: 4.31 MIL/uL (ref 3.80–5.20)
RBC: 4.11 MIL/uL (ref 3.80–5.20)
RDW: 15.8 % — AB (ref 11.5–14.5)
RDW: 16.4 % — AB (ref 11.5–14.5)
WBC: 13.1 10*3/uL — ABNORMAL HIGH (ref 3.6–11.0)
WBC: 12.9 10*3/uL — AB (ref 3.6–11.0)

## 2017-04-09 LAB — PHOSPHORUS
PHOSPHORUS: 3.1 mg/dL (ref 2.5–4.6)
Phosphorus: 3.1 mg/dL (ref 2.5–4.6)

## 2017-04-09 LAB — PROCALCITONIN: PROCALCITONIN: 58.86 ng/mL

## 2017-04-09 LAB — APTT: aPTT: 113 seconds — ABNORMAL HIGH (ref 24–36)

## 2017-04-09 LAB — MAGNESIUM
Magnesium: 1.7 mg/dL (ref 1.7–2.4)
Magnesium: 1.2 mg/dL — ABNORMAL LOW (ref 1.7–2.4)
Magnesium: 2 mg/dL (ref 1.7–2.4)
Magnesium: 2 mg/dL (ref 1.7–2.4)

## 2017-04-09 LAB — RAPID HIV SCREEN (HIV 1/2 AB+AG)
HIV 1/2 ANTIBODIES: NONREACTIVE
HIV-1 P24 Antigen - HIV24: NONREACTIVE

## 2017-04-09 LAB — PROTIME-INR
INR: 5.67 — AB
PROTHROMBIN TIME: 52.9 s — AB (ref 11.4–15.2)

## 2017-04-09 LAB — HEMOGLOBIN A1C
Hgb A1c MFr Bld: 6.2 % — ABNORMAL HIGH (ref 4.8–5.6)
Mean Plasma Glucose: 131 mg/dL

## 2017-04-09 LAB — FIBRIN DERIVATIVES D-DIMER (ARMC ONLY): FIBRIN DERIVATIVES D-DIMER (ARMC): 1906.7 — AB (ref 0.00–499.00)

## 2017-04-09 LAB — FIBRINOGEN: FIBRINOGEN: 575 mg/dL — AB (ref 210–475)

## 2017-04-09 LAB — LACTIC ACID, PLASMA
Lactic Acid, Venous: 4.3 mmol/L (ref 0.5–1.9)
Lactic Acid, Venous: 5 mmol/L (ref 0.5–1.9)

## 2017-04-09 LAB — VANCOMYCIN, RANDOM: VANCOMYCIN RM: 18

## 2017-04-09 MED ORDER — SODIUM CHLORIDE 0.9 % IV BOLUS (SEPSIS)
1000.0000 mL | INTRAVENOUS | Status: AC
  Administered 2017-04-09 (×2): 1000 mL via INTRAVENOUS

## 2017-04-09 MED ORDER — PHYTONADIONE 5 MG PO TABS: 5.0000 mg | ORAL_TABLET | Freq: Once | ORAL | Status: DC

## 2017-04-09 MED ORDER — ALBUMIN HUMAN 25 % IV SOLN
25.0000 g | Freq: Once | INTRAVENOUS | Status: AC
  Administered 2017-04-09: 25 g via INTRAVENOUS
  Filled 2017-04-09: qty 100

## 2017-04-09 MED ORDER — POTASSIUM CHLORIDE 20 MEQ PO PACK
40.0000 meq | PACK | Freq: Once | ORAL | Status: AC
  Administered 2017-04-09: 40 meq via ORAL
  Filled 2017-04-09: qty 2

## 2017-04-09 MED ORDER — DEXTROSE IN LACTATED RINGERS 5 % IV SOLN
INTRAVENOUS | Status: DC
  Administered 2017-04-09 – 2017-04-10 (×2): via INTRAVENOUS

## 2017-04-09 MED ORDER — SODIUM CHLORIDE 0.9 % IV BOLUS (SEPSIS)
500.0000 mL | Freq: Once | INTRAVENOUS | Status: AC
  Administered 2017-04-09: 500 mL via INTRAVENOUS

## 2017-04-09 MED ORDER — STERILE WATER FOR INJECTION IJ SOLN
INTRAMUSCULAR | Status: AC
  Administered 2017-04-09: 10 mL
  Filled 2017-04-09: qty 10

## 2017-04-09 MED ORDER — ENSURE ENLIVE PO LIQD
237.0000 mL | Freq: Three times a day (TID) | ORAL | Status: DC
  Administered 2017-04-09 (×3): 237 mL via ORAL

## 2017-04-09 MED ORDER — LEVOTHYROXINE SODIUM 100 MCG IV SOLR
25.0000 ug | Freq: Every day | INTRAVENOUS | Status: DC
  Administered 2017-04-09 – 2017-04-17 (×9): 25 ug via INTRAVENOUS
  Filled 2017-04-09 (×9): qty 5

## 2017-04-09 MED ORDER — PUREFLOW DIALYSIS SOLUTION
INTRAVENOUS | Status: DC
  Administered 2017-04-09: 3 via INTRAVENOUS_CENTRAL

## 2017-04-09 MED ORDER — MAGNESIUM SULFATE 4 GM/100ML IV SOLN
4.0000 g | Freq: Once | INTRAVENOUS | Status: AC
  Administered 2017-04-09: 4 g via INTRAVENOUS
  Filled 2017-04-09: qty 100

## 2017-04-09 MED ORDER — ALBUMIN HUMAN 25 % IV SOLN
25.0000 g | Freq: Four times a day (QID) | INTRAVENOUS | Status: DC
  Administered 2017-04-10 – 2017-04-11 (×6): 25 g via INTRAVENOUS
  Filled 2017-04-09 (×11): qty 100

## 2017-04-09 MED ORDER — SILVER SULFADIAZINE 1 % EX CREA
TOPICAL_CREAM | Freq: Two times a day (BID) | CUTANEOUS | Status: AC
  Administered 2017-04-09: 22:00:00 via TOPICAL
  Administered 2017-04-09: 1 via TOPICAL
  Administered 2017-04-10 – 2017-04-13 (×8): via TOPICAL
  Administered 2017-04-14: 1 via TOPICAL
  Administered 2017-04-14 – 2017-04-15 (×2): via TOPICAL
  Administered 2017-04-15: 1 via TOPICAL
  Administered 2017-04-16 – 2017-04-20 (×10): via TOPICAL
  Administered 2017-04-21: 1 via TOPICAL
  Administered 2017-04-21: 21:00:00 via TOPICAL
  Administered 2017-04-22 (×2): 1 via TOPICAL
  Administered 2017-04-23 – 2017-04-25 (×5): via TOPICAL
  Administered 2017-04-26: 1 via TOPICAL
  Administered 2017-04-26 – 2017-04-29 (×5): via TOPICAL
  Administered 2017-04-29: 1 via TOPICAL
  Administered 2017-04-30: 17:00:00 via TOPICAL
  Filled 2017-04-09 (×4): qty 25
  Filled 2017-04-09 (×2): qty 85
  Filled 2017-04-09 (×3): qty 25
  Filled 2017-04-09: qty 85

## 2017-04-09 MED ORDER — VITAMIN K1 10 MG/ML IJ SOLN
10.0000 mg | Freq: Once | INTRAMUSCULAR | Status: AC
  Administered 2017-04-09: 10 mg via INTRAVENOUS
  Filled 2017-04-09: qty 1

## 2017-04-09 MED ORDER — VANCOMYCIN HCL IN DEXTROSE 750-5 MG/150ML-% IV SOLN
750.0000 mg | INTRAVENOUS | Status: DC
  Administered 2017-04-09: 750 mg via INTRAVENOUS
  Filled 2017-04-09: qty 150

## 2017-04-09 MED ORDER — POTASSIUM PHOSPHATES 15 MMOLE/5ML IV SOLN: 20.0000 meq | Freq: Once | INTRAVENOUS | Status: DC

## 2017-04-09 MED ORDER — PUREFLOW DIALYSIS SOLUTION
INTRAVENOUS | Status: DC
  Administered 2017-04-09: 3 via INTRAVENOUS_CENTRAL
  Administered 2017-04-10 – 2017-04-11 (×3): via INTRAVENOUS_CENTRAL

## 2017-04-09 MED ORDER — HEPARIN SODIUM (PORCINE) 1000 UNIT/ML DIALYSIS
1000.0000 [IU] | INTRAMUSCULAR | Status: DC | PRN
  Administered 2017-04-11 (×2): 1800 [IU] via INTRAVENOUS_CENTRAL
  Filled 2017-04-09 (×3): qty 6

## 2017-04-09 NOTE — Progress Notes (Signed)
PULMONARY / CRITICAL CARE MEDICINE   Name: Latricia Cerrito MRN: [id number] DOB: 15-Apr-1951    ADMISSION DATE:  04/08/2017  PT PROFILE:   66 y.o. F with PMH of CAF, hypothyroidism admitted via ED to ICU with many days of progressive LE edema, blistering lesions on BLE, progressive weakness. Adm diagnosis of septic shock, cellulitis, AKI  MAJOR EVENTS/TEST RESULTS: 07/01 LE venous US: no DVT 07/01 Echocardiogram:    INDWELLING DEVICES:: 07/02 R femoral HD cath   MICRO DATA: MRSA PCR 07/01 >> NEG Urine 07/01 >>  Blood 07/01 >>  C diff 07/01 >> NEG GI panel 07/02 >> NEG  ANTIMICROBIALS:  Pip-tazo 07/01 >> 07/02 Vanc 07/01 >>  Ceftriaxone 07/02 >>   SUBJECTIVE:  Mildly confused. No distress  VITAL SIGNS: BP 120/60   Pulse (!) 113   Temp 98.7 F (37.1 C) (Oral)   Resp 14   Ht 5\' 5"  (1.651 m)   Wt 188 lb 15 oz (85.7 kg)   SpO2 97%   BMI 31.44 kg/m   HEMODYNAMICS:    VENTILATOR SETTINGS:    INTAKE / OUTPUT: I/O last 3 completed shifts: In: 6119.4 [P.O.:960; I.V.:4759.4; IV Piggyback:400] Out: 1495 [Urine:435; Stool:1060]  PHYSICAL EXAMINATION: General: NAD Neuro: CNs intact, MAEs HEENT: NCAT, sclerae whhite Cardiovascular: IRIR, mildly tachy, no M Lungs: clear anteriorly Abdomen: obese, soft, NT Ext: severe BLE edema from hips down, multiple bilateral bullous lesions  LABS:  BMET  Recent Labs Lab 04/09/17 0423 04/09/17 1710 04/09/17 2135  NA 142 142  142 141  K 3.4* 3.3*  3.3* 3.1*  CL 110 113*  112* 113*  CO2 22 19*  19* 20*  BUN 31* 34*  35* 34*  CREATININE 2.01* 2.07*  2.28* 2.03*  GLUCOSE 71 185*  185* 195*    Electrolytes  Recent Labs Lab 04/09/17 0423 04/09/17 1710 04/09/17 2135  CALCIUM 6.7* 6.9*  7.0* 6.9*  MG 1.7 2.0 2.0  PHOS 3.1 2.1* 1.8*    CBC  Recent Labs Lab 04/08/17 0139 04/09/17 0003 04/09/17 0423  WBC 13.2* 12.9* 13.1*  HGB 15.0 13.7 12.9  HCT 45.2 41.6 39.0  PLT 142* 115* 91*    Coag's  Recent  Labs Lab 04/08/17 1217 04/09/17 0003  APTT 111* 113*  INR 6.37* 5.67*    Sepsis Markers  Recent Labs Lab 04/08/17 0509 04/09/17 0003 04/09/17 0423 04/09/17 1710  LATICACIDVEN 4.5* 4.3* 5.0*  --   PROCALCITON  --   --   --  58.86    ABG No results for input(s): PHART, PCO2ART, PO2ART in the last 168 hours.  Liver Enzymes  Recent Labs Lab 04/08/17 0139 04/09/17 1710 04/09/17 2135  AST 46*  --   --   ALT 44  --   --   ALKPHOS 166*  --   --   BILITOT 1.8*  --   --   ALBUMIN 2.8* 2.2* 2.1*    Cardiac Enzymes  Recent Labs Lab 04/08/17 0139  TROPONINI 0.05*    Glucose  Recent Labs Lab 04/08/17 0637 04/08/17 0719  GLUCAP 65 65    CXR: Moderate R pleural effusion    ASSESSMENT / PLAN:  PULMONARY A: Mild hypoxemia R pleural effusion P:   Supplemental O2 to maintain SpO2 > 92%  CARDIOVASCULAR A:  Chronic AF Septic shock P:  Phenylephrine to maintain MAP > 65 mmHg  RENAL A:   AKI, oliguric Mild metabolic acidosis P:   Monitor BMET intermittently Monitor I/Os Correct electrolytes as indicated  Nephrology consultation Vasculitis serologies ordered HD cath placed in anticipation of possible CRRT  GASTROINTESTINAL A:   Diarrhea P:   SUP: N/I Reg diet  HEMATOLOGIC A:   Coagulopathy Thrombocytopenia Chronic anticoagulation with Pradaxa P:  Hold Pradaxa Vitamin K ordered DIC panel 07/03  INFECTIOUS A:   Elevated PCT Severe sepsis Presumed LE cellulitis P:   Monitor temp, WBC count Micro and abx as above ID consultation requested  ENDOCRINE A:   Hyperglycemia without prior dx of DM Episodic hypoglycemia P:   Monitor glu on chem panels Dextrose in IVFs Consider SSI for glu > 180  NEUROLOGIC A:   Mild delirium P:   RASS goal: 0 Minimize sedation   FAMILY  - Updates: Husband updated in detail   Merton Border, MD PCCM service Mobile [phone] Pager [phone] 04/09/2017 11:04 PM

## 2017-04-09 NOTE — Progress Notes (Signed)
Pharmacy Antibiotic Note  [name] is a 66 y.o. female admitted on 04/08/2017 with cellulitis.  Pharmacy has been consulted for vancomycin dosing.  Plan: Vancomycin 750 mg iv q 24 hours currently ordered. Due to acute renal failure, will check a random vancomycin level prior to the next dose and will hold the dose until results are available.   Goal trough 15-20 mcg/ml  Height: 5\' 5"  (165.1 cm) Weight: 175 lb 4.3 oz (79.5 kg) IBW/kg (Calculated) : 57  Temp (24hrs), Avg:97.3 F (36.3 C), Min:96.4 F (35.8 C), Max:97.8 F (36.6 C)   Recent Labs Lab 04/08/17 0139 04/08/17 0204 04/08/17 0509 04/08/17 1556 04/09/17 0003 04/09/17 0423  WBC 13.2*  --   --   --  12.9* 13.1*  CREATININE 1.94*  --   --  2.00* 2.18* 2.01*  LATICACIDVEN  --  5.2* 4.5*  --  4.3* 5.0*    Estimated Creatinine Clearance: 29.1 mL/min (A) (by C-G formula based on SCr of 2.01 mg/dL (H)).    No Known Allergies  Antimicrobials this admission: Zosyn 7/1 >> 7/2 Vancomycin 7/1  >>   Dose adjustments this admission:  Microbiology results: 7/1 BCx: NGTD 7/1 UCx: pending  C diff: negative MRSA PCR: negative  Thank you for allowing pharmacy to be a part of this patient's care.  Napoleon Form 04/09/2017 2:53 PM

## 2017-04-09 NOTE — Op Note (Signed)
  OPERATIVE NOTE   PROCEDURE: 1. Ultrasound guidance for vascular access right femoral vein 2. Placement of a 30 cm triple lumen dialysis catheter right femoral vein  PRE-OPERATIVE DIAGNOSIS: 1. Acute renal failure 2. Volume overload  POST-OPERATIVE DIAGNOSIS: Same  SURGEON: Leotis Pain, MD  ASSISTANT(S): None  ANESTHESIA: local  ESTIMATED BLOOD LOSS: Minimal   FINDING(S): 1. None  SPECIMEN(S): None  INDICATIONS:  Patient is a 66 y.o.female who presents with renal failure and volume overload.  Risks and benefits were discussed, and informed consent was obtained..  DESCRIPTION: After obtaining full informed written consent, the patient was laid flat in the bed. The right groin was sterilely prepped and draped in a sterile surgical field was created. The right femoral vein was visualized with ultrasound and found to be widely patent. It was then accessed under direct guidance without difficulty with a Seldinger needle and a permanent image was recorded. A J-wire was then placed. After skin nick and dilatation, a 30 cm Trialysis type dialysis catheter was placed over the wire and the wire was removed. The lumens withdrew dark red nonpulsatile blood and flushed easily with sterile saline. The catheter was secured to the skin with 3 nylon sutures. Sterile dressing was placed.  COMPLICATIONS: None  CONDITION: Stable  Leotis Pain 04/09/2017 4:54 PM  This note was created with Dragon Medical transcription system. Any errors in dictation are purely unintentional.

## 2017-04-09 NOTE — Consult Note (Signed)
[facility]ious Disease     Reason for Consult: Sepsis, cellulitis  Referring Physician: Elder Cyphers Date of Admission:  04/08/2017   Active Problems:   Septic shock ([place])   HPI: [name] is a 66 y.o. female admitted with redness and bullae on LE in setting of progressive edema and anasarca. She reports stopping all her meds about 3 months ago since her doctor retired and she had not been seen in over a year. She saw Dr Nehemiah Massed in June and started on cardizem and pradaxa. However she reports increasing edema, fatigue and weakness over the last 2 months. She has gained a lot of fluid weight. She came to ED due to the redness and swelling in her legs. On admit her temp was 97 and wbc 13.  She was noted to be   hypotensive and have anasarca and LE redness.  She had cr 2.0 (was 1.1 in June) and with low alb, TCP, elevated INR. UA showed proteinuria and RBC casts.  CXR showed CM, effusions, CHF and trop and BNP were elevated.  Renal USS neg, Echo pending.   She denies any travel recently, any animal exposure or sick contacts. She has not had any high fevers at home nor night sweats. She has no new medications except for the cardizem and pradaxa. Does not use otc or herbal supplements.   Past Medical History:  Diagnosis Date  . A-fib ([place])    on pradaxa  . Hypertension   . Thyroid disease    Past Surgical History:  Procedure Laterality Date  . WRIST ARTHROSCOPY     Social History  Substance Use Topics  . Smoking status: Never Smoker  . Smokeless tobacco: Never Used  . Alcohol use No   Family History  Problem Relation Age of Onset  . AAA (abdominal aortic aneurysm) Mother     Allergies: No Known Allergies  Current antibiotics: Antibiotics Given (last 72 hours)    Date/Time Action Medication Dose Rate   04/08/17 0318 New Bag/Given   piperacillin-tazobactam (ZOSYN) IVPB 3.375 g 3.375 g 100 mL/hr   04/08/17 0318 New Bag/Given   vancomycin (VANCOCIN) IVPB 1000 mg/200 mL  premix 1,000 mg 200 mL/hr   04/08/17 0803 New Bag/Given   piperacillin-tazobactam (ZOSYN) IVPB 3.375 g 3.375 g 12.5 mL/hr   04/08/17 1423 New Bag/Given   vancomycin (VANCOCIN) IVPB 750 mg/150 ml premix 750 mg 150 mL/hr   04/08/17 1729 New Bag/Given   piperacillin-tazobactam (ZOSYN) IVPB 3.375 g 3.375 g 12.5 mL/hr   04/09/17 0256 New Bag/Given   piperacillin-tazobactam (ZOSYN) IVPB 3.375 g 3.375 g 12.5 mL/hr   04/09/17 0838 New Bag/Given   piperacillin-tazobactam (ZOSYN) IVPB 3.375 g 3.375 g 12.5 mL/hr      MEDICATIONS: . feeding supplement (ENSURE ENLIVE)  237 mL Oral TID  . levothyroxine  25 mcg Intravenous Daily  . silver sulfADIAZINE   Topical BID    Review of Systems - 11 systems reviewed and negative per HPI   OBJECTIVE: Temp:  [97.3 F (36.3 C)-97.8 F (36.6 C)] 97.8 F (36.6 C) (07/02 0400) Pulse Rate:  [39-117] 103 (07/02 0915) Resp:  [21-32] 26 (07/02 0915) BP: (58-108)/(30-75) 99/61 (07/02 0915) SpO2:  [92 %-97 %] 94 % (07/02 0915) Physical Exam  Constitutional:  oriented to person, place, and time but somewhat slowed mentation. She is ill appearing.  HENT: [place]/AT, PERRLA, no scleral icterus, Mouth/Throat: Oropharynx has dried blood on lips, dry mm. No oropharyngeal exudate.  Cardiovascular:Tachy, 2/6 sm Pulmonary/Chest: dec bs  bil bases Neck = supple, no nuchal rigidity Abdominal: Soft. Bowel sounds are normal.  exhibits no distension. There is no tenderness.  Lymphadenopathy: no cervical adenopathy. No axillary adenopathy Neurological: alert and oriented to person, place, and time.  Ext 3+ edema bil le Skin: bruising on L inner thigh, Bil LE wrapped but Dr Alva Garnet and RN describe ruptured bullae Psychiatric: a normal mood and affect.  behavior is normal.    LABS: Results for orders placed or performed during the hospital encounter of 04/08/17 (from the past 48 hour(s))  CBC with Differential     Status: Abnormal   Collection Time: 04/08/17  1:39 AM   Result Value Ref Range   WBC 13.2 (H) 3.6 - 11.0 K/uL   RBC 4.76 3.80 - 5.20 MIL/uL   Hemoglobin 15.0 12.0 - 16.0 g/dL   HCT 45.2 35.0 - 47.0 %   MCV 95.0 80.0 - 100.0 fL   MCH 31.5 26.0 - 34.0 pg   MCHC 33.2 32.0 - 36.0 g/dL   RDW 15.5 (H) 11.5 - 14.5 %   Platelets 142 (L) 150 - 440 K/uL   Neutrophils Relative % 91 %   Neutro Abs 12.0 (H) 1.4 - 6.5 K/uL   Lymphocytes Relative 6 %   Lymphs Abs 0.7 (L) 1.0 - 3.6 K/uL   Monocytes Relative 2 %   Monocytes Absolute 0.3 0.2 - 0.9 K/uL   Eosinophils Relative 0 %   Eosinophils Absolute 0.0 0 - 0.7 K/uL   Basophils Relative 1 %   Basophils Absolute 0.1 0 - 0.1 K/uL  Comprehensive metabolic panel     Status: Abnormal   Collection Time: 04/08/17  1:39 AM  Result Value Ref Range   Sodium 142 135 - 145 mmol/L   Potassium 2.7 (LL) 3.5 - 5.1 mmol/L    Comment: CRITICAL RESULT CALLED TO, READ BACK BY AND VERIFIED WITH KENNY PATEL AT 0226 04/08/17.PMH   Chloride 104 101 - 111 mmol/L   CO2 25 22 - 32 mmol/L   Glucose, Bld 77 65 - 99 mg/dL   BUN 24 (H) 6 - 20 mg/dL   Creatinine, Ser 1.94 (H) 0.44 - 1.00 mg/dL   Calcium 7.8 (L) 8.9 - 10.3 mg/dL   Total Protein 5.5 (L) 6.5 - 8.1 g/dL   Albumin 2.8 (L) 3.5 - 5.0 g/dL   AST 46 (H) 15 - 41 U/L   ALT 44 14 - 54 U/L   Alkaline Phosphatase 166 (H) 38 - 126 U/L   Total Bilirubin 1.8 (H) 0.3 - 1.2 mg/dL   GFR calc non Af Amer 26 (L) >60 mL/min   GFR calc Af Amer 30 (L) >60 mL/min    Comment: (NOTE) The eGFR has been calculated using the CKD EPI equation. This calculation has not been validated in all clinical situations. eGFR's persistently <60 mL/min signify possible Chronic Kidney Disease.    Anion gap 13 5 - 15  Troponin I     Status: Abnormal   Collection Time: 04/08/17  1:39 AM  Result Value Ref Range   Troponin I 0.05 (HH) <0.03 ng/mL    Comment: CRITICAL RESULT CALLED TO, READ BACK BY AND VERIFIED WITH KENNY PATEL AT 0226 04/08/17.PMH  Brain natriuretic peptide     Status: Abnormal    Collection Time: 04/08/17  1:39 AM  Result Value Ref Range   B Natriuretic Peptide 2,012.0 (H) 0.0 - 100.0 pg/mL  TSH     Status: Abnormal   Collection Time: 04/08/17  1:39 AM  Result Value Ref Range   TSH 9.310 (H) 0.350 - 4.500 uIU/mL    Comment: Performed by a 3rd Generation assay with a functional sensitivity of <=0.01 uIU/mL.  Cortisol     Status: None   Collection Time: 04/08/17  1:39 AM  Result Value Ref Range   Cortisol, Plasma >100.0 ug/dL    Comment: RESULTS CONFIRMED BY MANUAL DILUTION (NOTE) AM    6.7 - 22.6 ug/dL PM   <10.0       ug/dL Performed at [facility] [address] [place], [place] [address]   Lactic acid, plasma     Status: Abnormal   Collection Time: 04/08/17  2:04 AM  Result Value Ref Range   Lactic Acid, Venous 5.2 (HH) 0.5 - 1.9 mmol/L    Comment: CRITICAL RESULT CALLED TO, READ BACK BY AND VERIFIED WITH KENNY PATEL AT 0304 04/08/17.PMH  Culture, blood (routine x 2)     Status: None (Preliminary result)   Collection Time: 04/08/17  2:04 AM  Result Value Ref Range   Specimen Description BLOOD RIGHT ANTECUBITAL    Special Requests      BOTTLES DRAWN AEROBIC AND ANAEROBIC Blood Culture adequate volume   Culture NO GROWTH 1 DAY    Report Status PENDING   Culture, blood (routine x 2)     Status: None (Preliminary result)   Collection Time: 04/08/17  2:06 AM  Result Value Ref Range   Specimen Description BLOOD LEFT ANTECUBITAL    Special Requests      BOTTLES DRAWN AEROBIC AND ANAEROBIC Blood Culture adequate volume   Culture NO GROWTH 1 DAY    Report Status PENDING   Lactic acid, plasma     Status: Abnormal   Collection Time: 04/08/17  5:09 AM  Result Value Ref Range   Lactic Acid, Venous 4.5 (HH) 0.5 - 1.9 mmol/L    Comment: CRITICAL RESULT CALLED TO, READ BACK BY AND VERIFIED WITH KENNY PATEL AT 1007 04/08/17.PMH  C difficile quick scan w PCR reflex     Status: None   Collection Time: 04/08/17  5:09 AM  Result Value Ref Range   C Diff  antigen NEGATIVE NEGATIVE   C Diff toxin NEGATIVE NEGATIVE   C Diff interpretation No C. difficile detected.   Glucose, capillary     Status: None   Collection Time: 04/08/17  6:37 AM  Result Value Ref Range   Glucose-Capillary 65 65 - 99 mg/dL  MRSA PCR Screening     Status: None   Collection Time: 04/08/17  6:50 AM  Result Value Ref Range   MRSA by PCR NEGATIVE NEGATIVE    Comment:        The GeneXpert MRSA Assay (FDA approved for NASAL specimens only), is one component of a comprehensive MRSA colonization surveillance program. It is not intended to diagnose MRSA infection nor to guide or monitor treatment for MRSA infections.   Glucose, capillary     Status: None   Collection Time: 04/08/17  7:19 AM  Result Value Ref Range   Glucose-Capillary 65 65 - 99 mg/dL  Hemoglobin A1c     Status: Abnormal   Collection Time: 04/08/17 11:26 AM  Result Value Ref Range   Hgb A1c MFr Bld 6.2 (H) 4.8 - 5.6 %    Comment: (NOTE)         Pre-diabetes: 5.7 - 6.4         Diabetes: >6.4  Glycemic control for adults with diabetes: <7.0    Mean Plasma Glucose 131 mg/dL    Comment: (NOTE) Performed At: [facility] [place], [place] [id number] Lindon Romp MD OZ:[id number]   Strep pneumoniae urinary antigen     Status: Abnormal   Collection Time: 04/08/17 12:17 PM  Result Value Ref Range   Strep Pneumo Urinary Antigen POSITIVE (A) NEGATIVE    Comment: Performed at [facility] [address] [place], [place] [address]  Protime-INR     Status: Abnormal   Collection Time: 04/08/17 12:17 PM  Result Value Ref Range   Prothrombin Time 58.1 (H) 11.4 - 15.2 seconds   INR 6.37 (HH)     Comment: CRITICAL RESULT CALLED TO, READ BACK BY AND VERIFIED WITH: CAROLINE SOGOMO ON 04/08/17 AT 1347 QSD   APTT     Status: Abnormal   Collection Time: 04/08/17 12:17 PM  Result Value Ref Range   aPTT 111 (H) 24 - 36 seconds    Comment:        IF BASELINE aPTT IS  ELEVATED, SUGGEST PATIENT RISK ASSESSMENT BE USED TO DETERMINE APPROPRIATE ANTICOAGULANT THERAPY.   Urinalysis, Complete w Microscopic     Status: Abnormal   Collection Time: 04/08/17 12:18 PM  Result Value Ref Range   Color, Urine AMBER (A) YELLOW    Comment: BIOCHEMICALS MAY BE AFFECTED BY COLOR   APPearance CLOUDY (A) CLEAR   Specific Gravity, Urine 1.024 1.005 - 1.030   pH 5.0 5.0 - 8.0   Glucose, UA NEGATIVE NEGATIVE mg/dL   Hgb urine dipstick MODERATE (A) NEGATIVE   Bilirubin Urine NEGATIVE NEGATIVE   Ketones, ur NEGATIVE NEGATIVE mg/dL   Protein, ur 100 (A) NEGATIVE mg/dL   Nitrite NEGATIVE NEGATIVE   Leukocytes, UA NEGATIVE NEGATIVE   RBC / HPF 0-5 0 - 5 RBC/hpf   WBC, UA 6-30 0 - 5 WBC/hpf   Bacteria, UA RARE (A) NONE SEEN   Squamous Epithelial / LPF 0-5 (A) NONE SEEN   RBC Casts, UA PRESENT   Potassium     Status: Abnormal   Collection Time: 04/08/17  1:00 PM  Result Value Ref Range   Potassium 3.0 (L) 3.5 - 5.1 mmol/L  BUN     Status: Abnormal   Collection Time: 04/08/17  3:56 PM  Result Value Ref Range   BUN 29 (H) 6 - 20 mg/dL  Creatinine, serum     Status: Abnormal   Collection Time: 04/08/17  3:56 PM  Result Value Ref Range   Creatinine, Ser 2.00 (H) 0.44 - 1.00 mg/dL   GFR calc non Af Amer 25 (L) >60 mL/min   GFR calc Af Amer 29 (L) >60 mL/min    Comment: (NOTE) The eGFR has been calculated using the CKD EPI equation. This calculation has not been validated in all clinical situations. eGFR's persistently <60 mL/min signify possible Chronic Kidney Disease.   Protein / creatinine ratio, urine     Status: Abnormal   Collection Time: 04/08/17  3:56 PM  Result Value Ref Range   Creatinine, Urine 148 mg/dL   Total Protein, Urine 166 mg/dL    Comment: RESULT CONFIRMED BY MANUAL DILUTION JJB NO NORMAL RANGE ESTABLISHED FOR THIS TEST    Protein Creatinine Ratio 1.12 (H) 0.00 - 0.15 mg/mg[Cre]  Protime-INR     Status: Abnormal   Collection Time: 04/09/17  12:03 AM  Result Value Ref Range   Prothrombin Time 52.9 (H) 11.4 - 15.2  seconds   INR 5.67 (HH)     Comment: CRITICAL RESULT CALLED TO, READ BACK BY AND VERIFIED WITH: CHELSEY WRENN AT 0109 ON 04/09/17 RWW   Fibrinogen     Status: Abnormal   Collection Time: 04/09/17 12:03 AM  Result Value Ref Range   Fibrinogen 575 (H) 210 - 475 mg/dL  APTT     Status: Abnormal   Collection Time: 04/09/17 12:03 AM  Result Value Ref Range   aPTT 113 (H) 24 - 36 seconds    Comment:        IF BASELINE aPTT IS ELEVATED, SUGGEST PATIENT RISK ASSESSMENT BE USED TO DETERMINE APPROPRIATE ANTICOAGULANT THERAPY.   Fibrin derivatives D-Dimer ([facility] only)     Status: Abnormal   Collection Time: 04/09/17 12:03 AM  Result Value Ref Range   Fibrin derivatives D-dimer ([facility]) 1,906.70 (H) 0.00 - 499.00    Comment: (NOTE) <> Exclusion of Venous Thromboembolism (VTE) - OUTPATIENT ONLY   (Emergency Department or [place])   0-499 ng/ml (FEU): With a low to intermediate pretest probability                      for VTE this test result excludes the diagnosis                      of VTE.   >499 ng/ml (FEU) : VTE not excluded; additional work up for VTE is                      required. <> Testing on Inpatients and Evaluation of Disseminated Intravascular   Coagulation (DIC) Reference Range:   0-499 ng/ml (FEU)   CBC     Status: Abnormal   Collection Time: 04/09/17 12:03 AM  Result Value Ref Range   WBC 12.9 (H) 3.6 - 11.0 K/uL   RBC 4.31 3.80 - 5.20 MIL/uL   Hemoglobin 13.7 12.0 - 16.0 g/dL   HCT 41.6 35.0 - 47.0 %   MCV 96.5 80.0 - 100.0 fL   MCH 31.8 26.0 - 34.0 pg   MCHC 32.9 32.0 - 36.0 g/dL   RDW 15.8 (H) 11.5 - 14.5 %   Platelets 115 (L) 150 - 440 K/uL  Basic metabolic panel     Status: Abnormal   Collection Time: 04/09/17 12:03 AM  Result Value Ref Range   Sodium 139 135 - 145 mmol/L   Potassium 3.9 3.5 - 5.1 mmol/L    Comment: HEMOLYSIS AT THIS LEVEL MAY AFFECT RESULT   Chloride 107 101 - 111  mmol/L   CO2 22 22 - 32 mmol/L   Glucose, Bld 64 (L) 65 - 99 mg/dL   BUN 32 (H) 6 - 20 mg/dL   Creatinine, Ser 2.18 (H) 0.44 - 1.00 mg/dL   Calcium 6.9 (L) 8.9 - 10.3 mg/dL   GFR calc non Af Amer 23 (L) >60 mL/min   GFR calc Af Amer 26 (L) >60 mL/min    Comment: (NOTE) The eGFR has been calculated using the CKD EPI equation. This calculation has not been validated in all clinical situations. eGFR's persistently <60 mL/min signify possible Chronic Kidney Disease.    Anion gap 10 5 - 15  Lactic acid, plasma     Status: Abnormal   Collection Time: 04/09/17 12:03 AM  Result Value Ref Range   Lactic Acid, Venous 4.3 (HH) 0.5 - 1.9 mmol/L    Comment: CRITICAL RESULT CALLED TO, READ BACK BY AND  VERIFIED WITH CHELSEA WRENN AT [place] 04/09/17.PMH  Magnesium     Status: Abnormal   Collection Time: 04/09/17 12:03 AM  Result Value Ref Range   Magnesium 1.2 (L) 1.7 - 2.4 mg/dL  Phosphorus     Status: None   Collection Time: 04/09/17 12:03 AM  Result Value Ref Range   Phosphorus 3.1 2.5 - 4.6 mg/dL  CBC     Status: Abnormal   Collection Time: 04/09/17  4:23 AM  Result Value Ref Range   WBC 13.1 (H) 3.6 - 11.0 K/uL   RBC 4.11 3.80 - 5.20 MIL/uL   Hemoglobin 12.9 12.0 - 16.0 g/dL   HCT 39.0 35.0 - 47.0 %   MCV 94.9 80.0 - 100.0 fL   MCH 31.3 26.0 - 34.0 pg   MCHC 32.9 32.0 - 36.0 g/dL   RDW 16.4 (H) 11.5 - 14.5 %   Platelets 91 (L) 150 - 440 K/uL  Basic metabolic panel     Status: Abnormal   Collection Time: 04/09/17  4:23 AM  Result Value Ref Range   Sodium 142 135 - 145 mmol/L   Potassium 3.4 (L) 3.5 - 5.1 mmol/L   Chloride 110 101 - 111 mmol/L   CO2 22 22 - 32 mmol/L   Glucose, Bld 71 65 - 99 mg/dL   BUN 31 (H) 6 - 20 mg/dL   Creatinine, Ser 2.01 (H) 0.44 - 1.00 mg/dL   Calcium 6.7 (L) 8.9 - 10.3 mg/dL   GFR calc non Af Amer 25 (L) >60 mL/min   GFR calc Af Amer 29 (L) >60 mL/min    Comment: (NOTE) The eGFR has been calculated using the CKD EPI equation. This calculation has not  been validated in all clinical situations. eGFR's persistently <60 mL/min signify possible Chronic Kidney Disease.    Anion gap 10 5 - 15  Phosphorus     Status: None   Collection Time: 04/09/17  4:23 AM  Result Value Ref Range   Phosphorus 3.1 2.5 - 4.6 mg/dL  Magnesium     Status: None   Collection Time: 04/09/17  4:23 AM  Result Value Ref Range   Magnesium 1.7 1.7 - 2.4 mg/dL  Lactic acid, plasma     Status: Abnormal   Collection Time: 04/09/17  4:23 AM  Result Value Ref Range   Lactic Acid, Venous 5.0 (HH) 0.5 - 1.9 mmol/L    Comment: CRITICAL RESULT CALLED TO, READ BACK BY AND VERIFIED WITH CHELSEA WRENN AT 7902 04/09/17.PMH   No components found for: ESR, C REACTIVE PROTEIN MICRO: Recent Results (from the past 720 hour(s))  Culture, blood (routine x 2)     Status: None (Preliminary result)   Collection Time: 04/08/17  2:04 AM  Result Value Ref Range Status   Specimen Description BLOOD RIGHT ANTECUBITAL  Final   Special Requests   Final    BOTTLES DRAWN AEROBIC AND ANAEROBIC Blood Culture adequate volume   Culture NO GROWTH 1 DAY  Final   Report Status PENDING  Incomplete  Culture, blood (routine x 2)     Status: None (Preliminary result)   Collection Time: 04/08/17  2:06 AM  Result Value Ref Range Status   Specimen Description BLOOD LEFT ANTECUBITAL  Final   Special Requests   Final    BOTTLES DRAWN AEROBIC AND ANAEROBIC Blood Culture adequate volume   Culture NO GROWTH 1 DAY  Final   Report Status PENDING  Incomplete  C difficile quick scan w PCR reflex  Status: None   Collection Time: 04/08/17  5:09 AM  Result Value Ref Range Status   C Diff antigen NEGATIVE NEGATIVE Final   C Diff toxin NEGATIVE NEGATIVE Final   C Diff interpretation No C. difficile detected.  Final  MRSA PCR Screening     Status: None   Collection Time: 04/08/17  6:50 AM  Result Value Ref Range Status   MRSA by PCR NEGATIVE NEGATIVE Final    Comment:        The GeneXpert MRSA Assay  (FDA approved for NASAL specimens only), is one component of a comprehensive MRSA colonization surveillance program. It is not intended to diagnose MRSA infection nor to guide or monitor treatment for MRSA infections.     IMAGING: [place] Chest  Result Date: 04/08/2017 CLINICAL DATA:  RIGHT pleural effusion EXAM: CHEST ULTRASOUND COMPARISON:  Chest radiograph 04/08/2017 FINDINGS: Small to moderate RIGHT pleural effusion identified. Patient imaged in LEFT lateral decubitus position, unable to sit. No loculations or septations identified. IMPRESSION: Small to moderate RIGHT pleural effusion. Electronically Signed   By: Lavonia Dana M.D.   On: 04/08/2017 11:20   Ct Abdomen Pelvis W Contrast  Result Date: 04/04/2017 CLINICAL DATA:  Abdominal soft tissue swelling with additional BILATERAL leg and feet swelling, blistering, history hypertension, abnormal LFTs, no longer on thyroid medication EXAM: CT ABDOMEN AND PELVIS WITH CONTRAST TECHNIQUE: Multidetector CT imaging of the abdomen and pelvis was performed using the standard protocol following bolus administration of intravenous contrast. Sagittal and coronal MPR images reconstructed from axial data set. CONTRAST:  193m ISOVUE-300 IOPAMIDOL (ISOVUE-300) INJECTION 61% IV. Dilute oral contrast. COMPARISON:  None FINDINGS: Lower chest: Small moderate RIGHT pleural effusion with some atelectasis and question infiltrate in RIGHT lower lobe. Compressive atelectasis at posterior RIGHT middle lobe. Mild subsegmental atelectasis LEFT lung base. Hepatobiliary: Calcified gallstone in gallbladder 8 mm diameter. No gross gallbladder wall thickening or biliary dilatation. Mild focal fatty infiltration of liver adjacent to falciform fissure. Liver otherwise unremarkable. Pancreas: Normal appearance Spleen: Normal appearance Adrenals/Urinary Tract: Adrenal glands, kidneys, ureters, and bladder normal appearance. Stomach/Bowel: Appendix not identified. No pericecal  inflammatory process. Stomach and bowel loops normal appearance. Vascular/Lymphatic: Atherosclerotic calcification aorta without aneurysm. No adenopathy. Reproductive: Uterus and adnexa unremarkable Other: No free air or free fluid. Scattered subcutaneous edema at the anterior abdominal wall of the mid to lower abdomen extending into the upper thighs greater on RIGHT. No hernia. Musculoskeletal: Mild osseous demineralization. Degenerative disc disease changes lumbar spine greatest at L3-L4. IMPRESSION: Moderate RIGHT pleural effusion with atelectasis of RIGHT lung base, cannot exclude consolidation in RIGHT lower lobe. Cholelithiasis without definite evidence of cholecystitis though if this is a clinical concern, consider ultrasound. Scattered subcutaneous edema at the anterior abdominal wall extending into the upper thighs greater on RIGHT, nonspecific. Aortic Atherosclerosis (ICD10-I70.0). Electronically Signed   By: MLavonia DanaM.D.   On: 04/04/2017 11:34   [place]  Result Date: 04/08/2017 CLINICAL DATA:  Acute renal failure EXAM: RENAL / URINARY TRACT ULTRASOUND COMPLETE COMPARISON:  CT 04/04/2017 FINDINGS: Right Kidney: Length: 11.1 cm. Echogenicity within normal limits. No mass or hydronephrosis visualized. Left Kidney: Length: 10.9 cm. 1.9 cm cyst off the lower pole. Echogenicity within normal limits. No mass or hydronephrosis visualized. Bladder: Foley catheter in place, decompressed IMPRESSION: No acute findings. Electronically Signed   By: KRolm BaptiseM.D.   On: 04/08/2017 16:18   [place]Venous Img Lower Bilateral  Result Date: 04/08/2017 CLINICAL DATA:  Bilateral lower extremity swelling for  2 weeks EXAM: BILATERAL LOWER EXTREMITY VENOUS DOPPLER ULTRASOUND TECHNIQUE: Gray-scale sonography with graded compression, as well as color Doppler and duplex ultrasound were performed to evaluate the lower extremity deep venous systems from the level of the common femoral vein and including the common femoral,  femoral, profunda femoral, popliteal and calf veins including the posterior tibial, peroneal and gastrocnemius veins when visible. The superficial great saphenous vein was also interrogated. Spectral Doppler was utilized to evaluate flow at rest and with distal augmentation maneuvers in the common femoral, femoral and popliteal veins. COMPARISON:  None. FINDINGS: RIGHT LOWER EXTREMITY Common Femoral Vein: No evidence of thrombus. Normal compressibility, respiratory phasicity and response to augmentation. Saphenofemoral Junction: No evidence of thrombus. Normal compressibility and flow on color Doppler imaging. Profunda Femoral Vein: No evidence of thrombus. Normal compressibility and flow on color Doppler imaging. Femoral Vein: No evidence of thrombus. Normal compressibility, respiratory phasicity and response to augmentation. Popliteal Vein: No evidence of thrombus. Normal compressibility, respiratory phasicity and response to augmentation. Calf Veins: Limited evaluation due to edema. Superficial Great Saphenous Vein: No evidence of thrombus. Normal compressibility and flow on color Doppler imaging. Venous Reflux:  None. Other Findings:  None. LEFT LOWER EXTREMITY Common Femoral Vein: No evidence of thrombus. Normal compressibility, respiratory phasicity and response to augmentation. Saphenofemoral Junction: No evidence of thrombus. Normal compressibility and flow on color Doppler imaging. Profunda Femoral Vein: No evidence of thrombus. Normal compressibility and flow on color Doppler imaging. Femoral Vein: No evidence of thrombus. Normal compressibility, respiratory phasicity and response to augmentation. Popliteal Vein: No evidence of thrombus. Normal compressibility, respiratory phasicity and response to augmentation. Calf Veins: Limited evaluation due to edema. Superficial Great Saphenous Vein: No evidence of thrombus. Normal compressibility and flow on color Doppler imaging. Venous Reflux:  None. Other  Findings:  None. IMPRESSION: No evidence of DVT within either lower extremity. Mild study limitations in the calf bilaterally due to edema. Electronically Signed   By: Andreas Newport M.D.   On: 04/08/2017 06:22   US Venous Img Lower Bilateral  Result Date: 03/23/2017 CLINICAL DATA:  Bilateral lower extremity edema. EXAM: BILATERAL LOWER EXTREMITY VENOUS DOPPLER ULTRASOUND TECHNIQUE: Gray-scale sonography with graded compression, as well as color Doppler and duplex ultrasound were performed to evaluate the lower extremity deep venous systems from the level of the common femoral vein and including the common femoral, femoral, profunda femoral, popliteal and calf veins including the posterior tibial, peroneal and gastrocnemius veins when visible. The superficial great saphenous vein was also interrogated. Spectral Doppler was utilized to evaluate flow at rest and with distal augmentation maneuvers in the common femoral, femoral and popliteal veins. COMPARISON:  None. FINDINGS: RIGHT LOWER EXTREMITY Common Femoral Vein: No evidence of thrombus. Normal compressibility, respiratory phasicity and response to augmentation. Saphenofemoral Junction: No evidence of thrombus. Normal compressibility and flow on color Doppler imaging. Profunda Femoral Vein: No evidence of thrombus. Normal compressibility and flow on color Doppler imaging. Femoral Vein: No evidence of thrombus. Normal compressibility, respiratory phasicity and response to augmentation. Popliteal Vein: No evidence of thrombus. Normal compressibility, respiratory phasicity and response to augmentation. Calf Veins: No evidence of thrombus. Normal compressibility and flow on color Doppler imaging. Superficial Great Saphenous Vein: No evidence of thrombus. Normal compressibility and flow on color Doppler imaging. Venous Reflux:  None. Other Findings:  Diffuse calf edema is present. LEFT LOWER EXTREMITY Common Femoral Vein: No evidence of thrombus. Normal  compressibility, respiratory phasicity and response to augmentation. Saphenofemoral Junction: No evidence of thrombus. Normal  compressibility and flow on color Doppler imaging. Profunda Femoral Vein: No evidence of thrombus. Normal compressibility and flow on color Doppler imaging. Femoral Vein: No evidence of thrombus. Normal compressibility, respiratory phasicity and response to augmentation. Popliteal Vein: No evidence of thrombus. Normal compressibility, respiratory phasicity and response to augmentation. Calf Veins: No evidence of thrombus. Normal compressibility and flow on color Doppler imaging. Superficial Great Saphenous Vein: No evidence of thrombus. Normal compressibility and flow on color Doppler imaging. Venous Reflux:  None. Other Findings:  Diffuse calf edema is present. IMPRESSION: No evidence of DVT within either lower extremity. Edema is present within the subcutaneous tissues of the calf bilaterally. Electronically Signed   By: San Morelle M.D.   On: 03/23/2017 14:48   Dg Chest Port 1 View  Result Date: 04/09/2017 CLINICAL DATA:  Sepsis. EXAM: PORTABLE CHEST 1 VIEW COMPARISON:  04/08/2017. FINDINGS: Cardiomegaly with persistent pulmonary venous congestion with mild basilar interstitial edema and small bilateral pleural effusions. Basilar atelectasis. IMPRESSION: 1. Persistent cardiomegaly with mild pulmonary venous congestion and basilar interstitial edema. Small bilateral pleural effusions. 2. Persistent bibasilar atelectasis. Bibasilar pneumonia cannot be excluded . Electronically Signed   By: Marcello Moores  Register   On: 04/09/2017 06:42   Dg Chest Port 1 View  Result Date: 04/08/2017 CLINICAL DATA:  66 y/o  F; bilateral lower extremity edema. EXAM: PORTABLE CHEST 1 VIEW COMPARISON:  09/18/2014 chest radiograph FINDINGS: Moderate cardiomegaly. Pulmonary venous hypertension. Moderate right pleural effusion and right basilar opacity. No acute osseous abnormality is evident. IMPRESSION:  Cardiomegaly. Pulmonary venous hypertension. Moderate right pleural effusion. Right basilar opacity may represent atelectasis or pneumonia. Electronically Signed   By: Kristine Garbe M.D.   On: 04/08/2017 02:20    Assessment:   Shada Nienaber is a 66 y.o. female admitted with 1-2 months of progressive LE edema, progressive to bullae and cellulitis, hypotension as well as bil pleural effusions. Labs are significant for coagulopathy, TCP,  elevated BNP, acute renal failure, low albumin, elevated lactate, proteinuria, marked elevated procalcitonin.  WBC only mildly elevated, no fevers. Carlton neg.   Her strep PNA urinary ag is also + but bcx pending. CT scan done a few days prior to admit shows effusion, possible R consolidation, gallstones and subq edema on Abd wall.   She certainly has LE cellulitis  but this is related to the edema and not the primary underlying etiology. Gram stain is neg on fluid from bullae. Also likely PNA based on CXR and urinary ag + S pna.  Echo is pending. Last on in 2015 at [facility] showed preserved EF, mild MR, mod TR.   She has not unusual exposures, no tick exposures. Endocarditis is a possibility but echo and bcx pending.  Recommendations Would dc vanco and continue zosyn for now pending bcx and wound cx. Check HIV, ESR.  Work up renal issues including SPEP, UPEP.   Thank you very much for allowing me to participate in the care of this patient. Please call with questions.   Cheral Marker. Ola Spurr, MD

## 2017-04-09 NOTE — Progress Notes (Signed)
MEDICATION RELATED CONSULT NOTE - INITIAL   Pharmacy Consult for CRRT medication management  Indication: CRRT  No Known Allergies  Patient Measurements: Height: 5\' 5"  (165.1 cm) Weight: 175 lb 4.3 oz (79.5 kg) IBW/kg (Calculated) : 57 Adjusted Body Weight:   Vital Signs: BP: 101/54 (07/02 1730) Pulse Rate: 115 (07/02 1730) Intake/Output from previous day: 07/01 0701 - 07/02 0700 In: 4985.1 [P.O.:600; I.V.:4135.1; IV Piggyback:250] Out: 240 [Urine:180; Stool:60] Intake/Output from this shift: No intake/output data recorded.  Labs:  Recent Labs  04/08/17 0139 04/08/17 1217 04/08/17 1556 04/09/17 0003 04/09/17 0423 04/09/17 1710  WBC 13.2*  --   --  12.9* 13.1*  --   HGB 15.0  --   --  13.7 12.9  --   HCT 45.2  --   --  41.6 39.0  --   PLT 142*  --   --  115* 91*  --   APTT  --  111*  --  113*  --   --   CREATININE 1.94*  --  2.00* 2.18* 2.01* 2.07*  2.28*  LABCREA  --   --  148  --   --   --   MG  --   --   --  1.2* 1.7 2.0  PHOS  --   --   --  3.1 3.1 2.1*  ALBUMIN 2.8*  --   --   --   --  2.2*  PROT 5.5*  --   --   --   --   --   AST 46*  --   --   --   --   --   ALT 44  --   --   --   --   --   ALKPHOS 166*  --   --   --   --   --   BILITOT 1.8*  --   --   --   --   --    Estimated Creatinine Clearance: 25.6 mL/min (A) (by C-G formula based on SCr of 2.28 mg/dL (H)).   Microbiology: Recent Results (from the past 720 hour(s))  Culture, blood (routine x 2)     Status: None (Preliminary result)   Collection Time: 04/08/17  2:04 AM  Result Value Ref Range Status   Specimen Description BLOOD RIGHT ANTECUBITAL  Final   Special Requests   Final    BOTTLES DRAWN AEROBIC AND ANAEROBIC Blood Culture adequate volume   Culture NO GROWTH 1 DAY  Final   Report Status PENDING  Incomplete  Culture, blood (routine x 2)     Status: None (Preliminary result)   Collection Time: 04/08/17  2:06 AM  Result Value Ref Range Status   Specimen Description BLOOD LEFT  ANTECUBITAL  Final   Special Requests   Final    BOTTLES DRAWN AEROBIC AND ANAEROBIC Blood Culture adequate volume   Culture NO GROWTH 1 DAY  Final   Report Status PENDING  Incomplete  C difficile quick scan w PCR reflex     Status: None   Collection Time: 04/08/17  5:09 AM  Result Value Ref Range Status   C Diff antigen NEGATIVE NEGATIVE Final   C Diff toxin NEGATIVE NEGATIVE Final   C Diff interpretation No C. difficile detected.  Final  MRSA PCR Screening     Status: None   Collection Time: 04/08/17  6:50 AM  Result Value Ref Range Status   MRSA by PCR NEGATIVE NEGATIVE Final  Comment:        The GeneXpert MRSA Assay (FDA approved for NASAL specimens only), is one component of a comprehensive MRSA colonization surveillance program. It is not intended to diagnose MRSA infection nor to guide or monitor treatment for MRSA infections.   Aerobic Culture (superficial specimen)     Status: None (Preliminary result)   Collection Time: 04/09/17 11:32 AM  Result Value Ref Range Status   Specimen Description SKIN  Final   Special Requests NONE  Final   Gram Stain   Final    RARE WBC PRESENT, PREDOMINANTLY PMN NO SQUAMOUS EPITHELIAL CELLS SEEN NO ORGANISMS SEEN Performed at [facility], [place] [address], [place], [place] [address]    Culture PENDING  Incomplete   Report Status PENDING  Incomplete    Medical History: Past Medical History:  Diagnosis Date  . A-fib ([place])    on pradaxa  . Hypertension   . Thyroid disease     Medications:  Prescriptions Prior to Admission  Medication Sig Dispense Refill Last Dose  . ALPRAZolam (XANAX) 0.25 MG tablet Take 0.25 mg by mouth at bedtime as needed for anxiety.   unknown at unknown  . dabigatran (PRADAXA) 150 MG CAPS capsule Take 150 mg by mouth 2 (two) times daily.   unknown at unknown  . diltiazem (TIAZAC) 180 MG 24 hr capsule Take 180 mg by mouth 2 (two) times daily.   unknown at unknown  . furosemide (LASIX) 20 MG  tablet Take 20 mg by mouth.   unknown at unknown  . levothyroxine (SYNTHROID, LEVOTHROID) 50 MCG tablet Take 50 mcg by mouth daily before breakfast.   unknown at unknown  . metoprolol tartrate (LOPRESSOR) 50 MG tablet Take 50 mg by mouth 2 (two) times daily.   unknown  . sertraline (ZOLOFT) 25 MG tablet Take 25 mg by mouth daily.   unknown at unknown   Scheduled:  . feeding supplement (ENSURE ENLIVE)  237 mL Oral TID  . levothyroxine  25 mcg Intravenous Daily  . silver sulfADIAZINE   Topical BID    Assessment: Pharmacy consulted to renally adjust medications in this patient currently receiving CRRT.  Goal of Therapy:    Plan:  Currently all medications are dosed appropriately therefore there isn't a need for any additional dose modifications @ this time.   Pharmacy to follow.   Shawne Bulow D 04/09/2017,7:01 PM

## 2017-04-09 NOTE — Progress Notes (Addendum)
Pharmacy Antibiotic Note  [name] is a 66 y.o. female admitted on 04/08/2017 with cellulitis.  Pharmacy has been consulted for vancomycin dosing.  Plan: Vancomycin 750 mg iv q 24 hours currently ordered. Due to acute renal failure, will check a random vancomycin level prior to the next dose and will hold the dose until results are available.  7/2: Vancomycin random level resulted @ 18. Nephrology has decided to start CRRT in this patient. Will restart Vancomycin 750 mg IV q24 hours. Goal trough 15-20 mcg/ml  Height: 5\' 5"  (165.1 cm) Weight: 175 lb 4.3 oz (79.5 kg) IBW/kg (Calculated) : 57  Temp (24hrs), Avg:97.6 F (36.4 C), Min:97.3 F (36.3 C), Max:97.8 F (36.6 C)   Recent Labs Lab 04/08/17 0139 04/08/17 0204 04/08/17 0509 04/08/17 1556 04/09/17 0003 04/09/17 0423 04/09/17 1710  WBC 13.2*  --   --   --  12.9* 13.1*  --   CREATININE 1.94*  --   --  2.00* 2.18* 2.01* 2.07*  2.28*  LATICACIDVEN  --  5.2* 4.5*  --  4.3* 5.0*  --   VANCORANDOM  --   --   --   --   --   --  18    Estimated Creatinine Clearance: 25.6 mL/min (A) (by C-G formula based on SCr of 2.28 mg/dL (H)).    No Known Allergies  Antimicrobials this admission: Zosyn 7/1 >> 7/2 Vancomycin 7/1  >>   Dose adjustments this admission:  Microbiology results: 7/1 BCx: NGTD 7/1 UCx: pending  C diff: negative MRSA PCR: negative  Thank you for allowing pharmacy to be a part of this patient's care.  Tishie Altmann D 04/09/2017 6:24 PM

## 2017-04-09 NOTE — Consult Note (Signed)
[name] V[facility] Consult Note  MRN : [id number]  [name] is a 66 y.o. (Jan 16, 1951) female who presents with chief complaint of  Chief Complaint  Patient presents with  . Leg Swelling  .  History of Present Illness: Patient is admitted to the hospital with heart failure and anasarca and has developed acute renal failure.  The patient reports weeks to months of worsening leg swelling after stopping her medications. The patient is critically ill with tachycardia and massive volume overload.  The critical care service has attempted two trialysis catheter placements for IV access and dialysis access without success due to her swelling and coagulopathy. We are asked to place the line as well as comment on her LE swelling.  She has had negative venous duplex for DVT but no true reflux study.  I have independently reviewed her CT of the abdomen which shows mild aorta and branch vessel atherosclerosis without stenosis and no significant venous thrombosis.   Current Facility-Administered Medications  Medication Dose Route Frequency Provider Last Rate Last Dose  . dextrose 5 % in lactated ringers infusion   Intravenous Continuous Wilhelmina Mcardle, MD 75 mL/hr at 04/09/17 1144    . feeding supplement (ENSURE ENLIVE) (ENSURE ENLIVE) liquid 237 mL  237 mL Oral TID Mikael Spray, NP   237 mL at 04/09/17 0836  . levothyroxine (SYNTHROID, LEVOTHROID) injection 25 mcg  25 mcg Intravenous Daily Wilhelmina Mcardle, MD   25 mcg at 04/09/17 1236  . ondansetron (ZOFRAN) injection 4 mg  4 mg Intravenous Q6H PRN Harrie Foreman, MD      . phenylephrine (NEO-SYNEPHRINE) 40 mg in sodium chloride 0.9 % 250 mL (0.16 mg/mL) infusion  0-400 mcg/min Intravenous Titrated Lenis Noon, RPH 18.8 mL/hr at 04/09/17 0915 50 mcg/min at 04/09/17 0915  . silver sulfADIAZINE (SILVADENE) 1 % cream   Topical BID Mikael Spray, NP   1 application at 78/24/23 0840  . vancomycin (VANCOCIN) IVPB  750 mg/150 ml premix  750 mg Intravenous Q24H Napoleon Form, [facility]   Stopped at 04/08/17 1523  . zinc oxide (BALMEX) 11.3 % cream   Topical QID PRN Lenis Noon, [facility]        Past Medical History:  Diagnosis Date  . A-fib ([place])    on pradaxa  . Hypertension   . Thyroid disease     Past Surgical History:  Procedure Laterality Date  . WRIST ARTHROSCOPY      Social History Social History  Substance Use Topics  . Smoking status: Never Smoker  . Smokeless tobacco: Never Used  . Alcohol use No  No IVDU  Family History Family History  Problem Relation Age of Onset  . AAA (abdominal aortic aneurysm) Mother   No bleeding disorders, clotting disorders, porphyrias, or autoimmune diseases  No Known Allergies   REVIEW OF SYSTEMS (Negative unless checked)  Constitutional: [] Weight loss  [] Fever  [] Chills Cardiac: [] Chest pain   [] Chest pressure   [x] Palpitations   [x] Shortness of breath when laying flat   [] Shortness of breath at rest   [] Shortness of breath with exertion. Vascular:  [] Pain in legs with walking   [x] Pain in legs at rest   [] Pain in legs when laying flat   [] Claudication   [] Pain in feet when walking  [] Pain in feet at rest  [] Pain in feet when laying flat   [] History of DVT   [] Phlebitis   [x] Swelling in legs   [] Varicose veins   []   Non-healing ulcers Pulmonary:   [] Uses home oxygen   [x] Productive cough   [] Hemoptysis   [] Wheeze  [] COPD   [] Asthma Neurologic:  [] Dizziness  [] Blackouts   [] Seizures   [] History of stroke   [] History of TIA  [] Aphasia   [] Temporary blindness   [] Dysphagia   [] Weakness or numbness in arms   [] Weakness or numbness in legs Musculoskeletal:  [x] Arthritis   [] Joint swelling   [] Joint pain   [] Low back pain Hematologic:  [] Easy bruising  [x] Easy bleeding   [] Hypercoagulable state   [x] Anemic  [] Hepatitis Gastrointestinal:  [] Blood in stool   [] Vomiting blood  [] Gastroesophageal reflux/heartburn   [] Difficulty swallowing. Genitourinary:   [] Chronic kidney disease   [] Difficult urination  [] Frequent urination  [] Burning with urination   [] Blood in urine Skin:  [] Rashes   [] Ulcers   [] Wounds Psychological:  [] History of anxiety   []  History of major depression.       Physical Examination  Vitals:   04/09/17 0830 04/09/17 0845 04/09/17 0900 04/09/17 0915  BP: 95/61 108/67 (!) 105/56 99/61  Pulse: 95 (!) 108 (!) 104 (!) 103  Resp: (!) 21 (!) 25 (!) 23 (!) 26  Temp:      TempSrc:      SpO2: 94% 93% 93% 94%  Weight:      Height:       Body mass index is 29.17 kg/m. Gen: WD/WN.  Head: [place]/AT, No temporalis wasting Ear/Nose/Throat: Hearing grossly intact, nares w/o erythema or drainage Eyes: Sclera non-icteric, conjunctiva clear Neck: Supple, no nuchal rigidity.  No JVD.  Pulmonary:  Good air movement, respirations labored on oxygen, decreased in both bases Cardiac: irregular and tachycardic Vascular: marked stasis changes and 3+ swelling bilaterally.  Pulses not palpable with swelling but feet warm with good capillary refill Gastrointestinal: soft, non-tender/non-distended. Anasarca present Musculoskeletal: M/S 5/5 throughout.  Extremities without ischemic changes.  No deformity or atrophy. 3+ Edema in the lower extremities bilaterally Neurologic: non focal.  Moves all four.  A&O x 3.  Psychiatric: normal affect and mood Dermatologic: No rashes or ulcers noted.         CBC Lab Results  Component Value Date   WBC 13.1 (H) 04/09/2017   HGB 12.9 04/09/2017   HCT 39.0 04/09/2017   MCV 94.9 04/09/2017   PLT 91 (L) 04/09/2017    BMET    Component Value Date/Time   NA 142 04/09/2017 0423   NA 140 09/19/2014 0621   K 3.4 (L) 04/09/2017 0423   K 4.1 09/19/2014 0621   CL 110 04/09/2017 0423   CL 109 (H) 09/19/2014 0621   CO2 22 04/09/2017 0423   CO2 23 09/19/2014 0621   GLUCOSE 71 04/09/2017 0423   GLUCOSE 99 09/19/2014 0621   BUN 31 (H) 04/09/2017 0423   BUN 10 09/19/2014 0621   CREATININE 2.01 (H)  04/09/2017 0423   CREATININE 0.62 09/19/2014 0621   CALCIUM 6.7 (L) 04/09/2017 0423   CALCIUM 8.3 (L) 09/19/2014 0621   GFRNONAA 25 (L) 04/09/2017 0423   GFRNONAA >60 09/19/2014 0621   GFRAA 29 (L) 04/09/2017 0423   GFRAA >60 09/19/2014 6010   Estimated Creatinine Clearance: 29.1 mL/min (A) (by C-G formula based on SCr of 2.01 mg/dL (H)).  COAG Lab Results  Component Value Date   INR 5.67 (HH) 04/09/2017   INR 6.37 (HH) 04/08/2017   INR 1.0 09/18/2014    Radiology [place] Chest  Result Date: 04/08/2017 CLINICAL DATA:  RIGHT pleural effusion EXAM: CHEST ULTRASOUND  COMPARISON:  Chest radiograph 04/08/2017 FINDINGS: Small to moderate RIGHT pleural effusion identified. Patient imaged in LEFT lateral decubitus position, unable to sit. No loculations or septations identified. IMPRESSION: Small to moderate RIGHT pleural effusion. Electronically Signed   By: Lavonia Dana M.D.   On: 04/08/2017 11:20   Ct Abdomen Pelvis W Contrast  Result Date: 04/04/2017 CLINICAL DATA:  Abdominal soft tissue swelling with additional BILATERAL leg and feet swelling, blistering, history hypertension, abnormal LFTs, no longer on thyroid medication EXAM: CT ABDOMEN AND PELVIS WITH CONTRAST TECHNIQUE: Multidetector CT imaging of the abdomen and pelvis was performed using the standard protocol following bolus administration of intravenous contrast. Sagittal and coronal MPR images reconstructed from axial data set. CONTRAST:  138mL ISOVUE-300 IOPAMIDOL (ISOVUE-300) INJECTION 61% IV. Dilute oral contrast. COMPARISON:  None FINDINGS: Lower chest: Small moderate RIGHT pleural effusion with some atelectasis and question infiltrate in RIGHT lower lobe. Compressive atelectasis at posterior RIGHT middle lobe. Mild subsegmental atelectasis LEFT lung base. Hepatobiliary: Calcified gallstone in gallbladder 8 mm diameter. No gross gallbladder wall thickening or biliary dilatation. Mild focal fatty infiltration of liver adjacent to falciform  fissure. Liver otherwise unremarkable. Pancreas: Normal appearance Spleen: Normal appearance Adrenals/Urinary Tract: Adrenal glands, kidneys, ureters, and bladder normal appearance. Stomach/Bowel: Appendix not identified. No pericecal inflammatory process. Stomach and bowel loops normal appearance. Vascular/Lymphatic: Atherosclerotic calcification aorta without aneurysm. No adenopathy. Reproductive: Uterus and adnexa unremarkable Other: No free air or free fluid. Scattered subcutaneous edema at the anterior abdominal wall of the mid to lower abdomen extending into the upper thighs greater on RIGHT. No hernia. Musculoskeletal: Mild osseous demineralization. Degenerative disc disease changes lumbar spine greatest at L3-L4. IMPRESSION: Moderate RIGHT pleural effusion with atelectasis of RIGHT lung base, cannot exclude consolidation in RIGHT lower lobe. Cholelithiasis without definite evidence of cholecystitis though if this is a clinical concern, consider ultrasound. Scattered subcutaneous edema at the anterior abdominal wall extending into the upper thighs greater on RIGHT, nonspecific. Aortic Atherosclerosis (ICD10-I70.0). Electronically Signed   By: Lavonia Dana M.D.   On: 04/04/2017 11:34   US Renal  Result Date: 04/08/2017 CLINICAL DATA:  Acute renal failure EXAM: RENAL / URINARY TRACT ULTRASOUND COMPLETE COMPARISON:  CT 04/04/2017 FINDINGS: Right Kidney: Length: 11.1 cm. Echogenicity within normal limits. No mass or hydronephrosis visualized. Left Kidney: Length: 10.9 cm. 1.9 cm cyst off the lower pole. Echogenicity within normal limits. No mass or hydronephrosis visualized. Bladder: Foley catheter in place, decompressed IMPRESSION: No acute findings. Electronically Signed   By: Rolm Baptise M.D.   On: 04/08/2017 16:18   US Venous Img Lower Bilateral  Result Date: 04/08/2017 CLINICAL DATA:  Bilateral lower extremity swelling for 2 weeks EXAM: BILATERAL LOWER EXTREMITY VENOUS DOPPLER ULTRASOUND TECHNIQUE:  Gray-scale sonography with graded compression, as well as color Doppler and duplex ultrasound were performed to evaluate the lower extremity deep venous systems from the level of the common femoral vein and including the common femoral, femoral, profunda femoral, popliteal and calf veins including the posterior tibial, peroneal and gastrocnemius veins when visible. The superficial great saphenous vein was also interrogated. Spectral Doppler was utilized to evaluate flow at rest and with distal augmentation maneuvers in the common femoral, femoral and popliteal veins. COMPARISON:  None. FINDINGS: RIGHT LOWER EXTREMITY Common Femoral Vein: No evidence of thrombus. Normal compressibility, respiratory phasicity and response to augmentation. Saphenofemoral Junction: No evidence of thrombus. Normal compressibility and flow on color Doppler imaging. Profunda Femoral Vein: No evidence of thrombus. Normal compressibility and flow on color Doppler  imaging. Femoral Vein: No evidence of thrombus. Normal compressibility, respiratory phasicity and response to augmentation. Popliteal Vein: No evidence of thrombus. Normal compressibility, respiratory phasicity and response to augmentation. Calf Veins: Limited evaluation due to edema. Superficial Great Saphenous Vein: No evidence of thrombus. Normal compressibility and flow on color Doppler imaging. Venous Reflux:  None. Other Findings:  None. LEFT LOWER EXTREMITY Common Femoral Vein: No evidence of thrombus. Normal compressibility, respiratory phasicity and response to augmentation. Saphenofemoral Junction: No evidence of thrombus. Normal compressibility and flow on color Doppler imaging. Profunda Femoral Vein: No evidence of thrombus. Normal compressibility and flow on color Doppler imaging. Femoral Vein: No evidence of thrombus. Normal compressibility, respiratory phasicity and response to augmentation. Popliteal Vein: No evidence of thrombus. Normal compressibility, respiratory  phasicity and response to augmentation. Calf Veins: Limited evaluation due to edema. Superficial Great Saphenous Vein: No evidence of thrombus. Normal compressibility and flow on color Doppler imaging. Venous Reflux:  None. Other Findings:  None. IMPRESSION: No evidence of DVT within either lower extremity. Mild study limitations in the calf bilaterally due to edema. Electronically Signed   By: Andreas Newport M.D.   On: 04/08/2017 06:22   US Venous Img Lower Bilateral  Result Date: 03/23/2017 CLINICAL DATA:  Bilateral lower extremity edema. EXAM: BILATERAL LOWER EXTREMITY VENOUS DOPPLER ULTRASOUND TECHNIQUE: Gray-scale sonography with graded compression, as well as color Doppler and duplex ultrasound were performed to evaluate the lower extremity deep venous systems from the level of the common femoral vein and including the common femoral, femoral, profunda femoral, popliteal and calf veins including the posterior tibial, peroneal and gastrocnemius veins when visible. The superficial great saphenous vein was also interrogated. Spectral Doppler was utilized to evaluate flow at rest and with distal augmentation maneuvers in the common femoral, femoral and popliteal veins. COMPARISON:  None. FINDINGS: RIGHT LOWER EXTREMITY Common Femoral Vein: No evidence of thrombus. Normal compressibility, respiratory phasicity and response to augmentation. Saphenofemoral Junction: No evidence of thrombus. Normal compressibility and flow on color Doppler imaging. Profunda Femoral Vein: No evidence of thrombus. Normal compressibility and flow on color Doppler imaging. Femoral Vein: No evidence of thrombus. Normal compressibility, respiratory phasicity and response to augmentation. Popliteal Vein: No evidence of thrombus. Normal compressibility, respiratory phasicity and response to augmentation. Calf Veins: No evidence of thrombus. Normal compressibility and flow on color Doppler imaging. Superficial Great Saphenous Vein: No  evidence of thrombus. Normal compressibility and flow on color Doppler imaging. Venous Reflux:  None. Other Findings:  Diffuse calf edema is present. LEFT LOWER EXTREMITY Common Femoral Vein: No evidence of thrombus. Normal compressibility, respiratory phasicity and response to augmentation. Saphenofemoral Junction: No evidence of thrombus. Normal compressibility and flow on color Doppler imaging. Profunda Femoral Vein: No evidence of thrombus. Normal compressibility and flow on color Doppler imaging. Femoral Vein: No evidence of thrombus. Normal compressibility, respiratory phasicity and response to augmentation. Popliteal Vein: No evidence of thrombus. Normal compressibility, respiratory phasicity and response to augmentation. Calf Veins: No evidence of thrombus. Normal compressibility and flow on color Doppler imaging. Superficial Great Saphenous Vein: No evidence of thrombus. Normal compressibility and flow on color Doppler imaging. Venous Reflux:  None. Other Findings:  Diffuse calf edema is present. IMPRESSION: No evidence of DVT within either lower extremity. Edema is present within the subcutaneous tissues of the calf bilaterally. Electronically Signed   By: San Morelle M.D.   On: 03/23/2017 14:48   Dg Chest Port 1 View  Result Date: 04/09/2017 CLINICAL DATA:  Sepsis. EXAM: PORTABLE CHEST  1 VIEW COMPARISON:  04/08/2017. FINDINGS: Cardiomegaly with persistent pulmonary venous congestion with mild basilar interstitial edema and small bilateral pleural effusions. Basilar atelectasis. IMPRESSION: 1. Persistent cardiomegaly with mild pulmonary venous congestion and basilar interstitial edema. Small bilateral pleural effusions. 2. Persistent bibasilar atelectasis. Bibasilar pneumonia cannot be excluded . Electronically Signed   By: Marcello Moores  Register   On: 04/09/2017 06:42   Dg Chest Port 1 View  Result Date: 04/08/2017 CLINICAL DATA:  66 y/o  F; bilateral lower extremity edema. EXAM: PORTABLE CHEST 1  VIEW COMPARISON:  09/18/2014 chest radiograph FINDINGS: Moderate cardiomegaly. Pulmonary venous hypertension. Moderate right pleural effusion and right basilar opacity. No acute osseous abnormality is evident. IMPRESSION: Cardiomegaly. Pulmonary venous hypertension. Moderate right pleural effusion. Right basilar opacity may represent atelectasis or pneumonia. Electronically Signed   By: Kristine Garbe M.D.   On: 04/08/2017 02:20      Assessment/Plan 1. ARF. Placing line for CRRT if needed as well as IV access. 2. Anasarca.  Volume overload likely from heart failure and renal insufficiency.  Hopefully will improved with dialysis 3. Coagulopathy.  Will use [place] and try to place line as atraumatically as possible, but risk of bleeding is high.  Needs the catheter though so risk is outweighed by the benefit. Catheter to be placed in femoral location as well to lessen bleeding risk and since right IJ already tried.  Should never put dialysis catheter in subclavian vein due to risk of venous stenosis around catheter limiting future access options. 4. LE swelling.  Marked.  Likely from cardiac and renal issues, but venous reflux can be done as outpatient in several weeks if not improved with improvement in medical status.      We will proceed with temporary dialysis catheter placement at this time.  Risks and benefits discussed with patient and/or family, and the catheter will be placed to allow immediate initiation of dialysis.  If the patient's renal function does not improve throughout the hospital course, we will be happy to place a tunneled dialysis catheter for long term use prior to discharge.     Leotis Pain, MD  04/09/2017 4:25 PM

## 2017-04-09 NOTE — Progress Notes (Signed)
[facility]  ROUNDING NOTE   Subjective:   Husband at bedside.  Patient with intermittent confusion.   Pulse went to 216 overnight.   UOP 175mL.   Wbc 13.1 (12.9)  Creatinine 2.01 (2.18)  On phenylephrine gtt  vanxo and zosyn  Objective:  Vital signs in last 24 hours:  Temp:  [96.4 F (35.8 C)-97.8 F (36.6 C)] 97.8 F (36.6 C) (07/02 0400) Pulse Rate:  [25-216] 103 (07/02 0915) Resp:  [16-32] 26 (07/02 0915) BP: (58-108)/(30-82) 99/61 (07/02 0915) SpO2:  [88 %-100 %] 94 % (07/02 0915)  Weight change:  Filed Weights   04/08/17 0120 04/08/17 0648  Weight: 86.2 kg (190 lb) 79.5 kg (175 lb 4.3 oz)    Intake/Output: I/O last 3 completed shifts: In: 5235.1 [P.O.:600; I.V.:4135.1; IV Piggyback:500] Out: 240 [Urine:180; Stool:60]   Intake/Output this shift:  Total I/O In: 240 [P.O.:240] Out: 20 [Urine:20]  Physical Exam: General: NAD, sitting up in bed  Head: Normocephalic, atraumatic. Moist oral mucosal membranes  Eyes: Anicteric, PERRL  Neck: Supple, trachea midline  Lungs:  Bilateral crackles  Heart: irregular  Abdomen:  Soft, nontender, obese  Extremities: 4+ weeping bullae lower extremity edema.  Neurologic: Nonfocal, moving all four extremities  Skin: Bullous lesions with serous anginous   Access: none    Basic Metabolic Panel:  Recent Labs Lab 04/08/17 0139 04/08/17 1300 04/08/17 1556 04/09/17 0003 04/09/17 0423  NA 142  --   --  139 142  K 2.7* 3.0*  --  3.9 3.4*  CL 104  --   --  107 110  CO2 25  --   --  22 22  GLUCOSE 77  --   --  64* 71  BUN 24*  --  29* 32* 31*  CREATININE 1.94*  --  2.00* 2.18* 2.01*  CALCIUM 7.8*  --   --  6.9* 6.7*  MG  --   --   --  1.2* 1.7  PHOS  --   --   --  3.1 3.1    Liver Function Tests:  Recent Labs Lab 04/08/17 0139  AST 46*  ALT 44  ALKPHOS 166*  BILITOT 1.8*  PROT 5.5*  ALBUMIN 2.8*   No results for input(s): LIPASE, AMYLASE in the last 168 hours. No results for input(s):  AMMONIA in the last 168 hours.  CBC:  Recent Labs Lab 04/08/17 0139 04/09/17 0003 04/09/17 0423  WBC 13.2* 12.9* 13.1*  NEUTROABS 12.0*  --   --   HGB 15.0 13.7 12.9  HCT 45.2 41.6 39.0  MCV 95.0 96.5 94.9  PLT 142* 115* 91*    Cardiac Enzymes:  Recent Labs Lab 04/08/17 0139  TROPONINI 0.05*    BNP: Invalid input(s): POCBNP  CBG:  Recent Labs Lab 04/08/17 0637 04/08/17 0719  GLUCAP 65 87    Microbiology: Results for orders placed or performed during the hospital encounter of 04/08/17  Culture, blood (routine x 2)     Status: None (Preliminary result)   Collection Time: 04/08/17  2:04 AM  Result Value Ref Range Status   Specimen Description BLOOD RIGHT ANTECUBITAL  Final   Special Requests   Final    BOTTLES DRAWN AEROBIC AND ANAEROBIC Blood Culture adequate volume   Culture NO GROWTH 1 DAY  Final   Report Status PENDING  Incomplete  Culture, blood (routine x 2)     Status: None (Preliminary result)   Collection Time: 04/08/17  2:06 AM  Result Value Ref Range  Status   Specimen Description BLOOD LEFT ANTECUBITAL  Final   Special Requests   Final    BOTTLES DRAWN AEROBIC AND ANAEROBIC Blood Culture adequate volume   Culture NO GROWTH 1 DAY  Final   Report Status PENDING  Incomplete  C difficile quick scan w PCR reflex     Status: None   Collection Time: 04/08/17  5:09 AM  Result Value Ref Range Status   C Diff antigen NEGATIVE NEGATIVE Final   C Diff toxin NEGATIVE NEGATIVE Final   C Diff interpretation No C. difficile detected.  Final  MRSA PCR Screening     Status: None   Collection Time: 04/08/17  6:50 AM  Result Value Ref Range Status   MRSA by PCR NEGATIVE NEGATIVE Final    Comment:        The GeneXpert MRSA Assay (FDA approved for NASAL specimens only), is one component of a comprehensive MRSA colonization surveillance program. It is not intended to diagnose MRSA infection nor to guide or monitor treatment for MRSA infections.      Coagulation Studies:  Recent Labs  04/08/17 1217 04/09/17 0003  LABPROT 58.1* 52.9*  INR 6.37* 5.67*    Urinalysis:  Recent Labs  04/08/17 1218  COLORURINE AMBER*  LABSPEC 1.024  PHURINE 5.0  GLUCOSEU NEGATIVE  HGBUR MODERATE*  BILIRUBINUR NEGATIVE  KETONESUR NEGATIVE  PROTEINUR 100*  NITRITE NEGATIVE  LEUKOCYTESUR NEGATIVE      Imaging: [place] Chest  Result Date: 04/08/2017 CLINICAL DATA:  RIGHT pleural effusion EXAM: CHEST ULTRASOUND COMPARISON:  Chest radiograph 04/08/2017 FINDINGS: Small to moderate RIGHT pleural effusion identified. Patient imaged in LEFT lateral decubitus position, unable to sit. No loculations or septations identified. IMPRESSION: Small to moderate RIGHT pleural effusion. Electronically Signed   By: Lavonia Dana M.D.   On: 04/08/2017 11:20   US Renal  Result Date: 04/08/2017 CLINICAL DATA:  Acute renal failure EXAM: RENAL / URINARY TRACT ULTRASOUND COMPLETE COMPARISON:  CT 04/04/2017 FINDINGS: Right Kidney: Length: 11.1 cm. Echogenicity within normal limits. No mass or hydronephrosis visualized. Left Kidney: Length: 10.9 cm. 1.9 cm cyst off the lower pole. Echogenicity within normal limits. No mass or hydronephrosis visualized. Bladder: Foley catheter in place, decompressed IMPRESSION: No acute findings. Electronically Signed   By: Rolm Baptise M.D.   On: 04/08/2017 16:18   US Venous Img Lower Bilateral  Result Date: 04/08/2017 CLINICAL DATA:  Bilateral lower extremity swelling for 2 weeks EXAM: BILATERAL LOWER EXTREMITY VENOUS DOPPLER ULTRASOUND TECHNIQUE: Gray-scale sonography with graded compression, as well as color Doppler and duplex ultrasound were performed to evaluate the lower extremity deep venous systems from the level of the common femoral vein and including the common femoral, femoral, profunda femoral, popliteal and calf veins including the posterior tibial, peroneal and gastrocnemius veins when visible. The superficial great saphenous vein  was also interrogated. Spectral Doppler was utilized to evaluate flow at rest and with distal augmentation maneuvers in the common femoral, femoral and popliteal veins. COMPARISON:  None. FINDINGS: RIGHT LOWER EXTREMITY Common Femoral Vein: No evidence of thrombus. Normal compressibility, respiratory phasicity and response to augmentation. Saphenofemoral Junction: No evidence of thrombus. Normal compressibility and flow on color Doppler imaging. Profunda Femoral Vein: No evidence of thrombus. Normal compressibility and flow on color Doppler imaging. Femoral Vein: No evidence of thrombus. Normal compressibility, respiratory phasicity and response to augmentation. Popliteal Vein: No evidence of thrombus. Normal compressibility, respiratory phasicity and response to augmentation. Calf Veins: Limited evaluation due to edema. Superficial Great Saphenous  Vein: No evidence of thrombus. Normal compressibility and flow on color Doppler imaging. Venous Reflux:  None. Other Findings:  None. LEFT LOWER EXTREMITY Common Femoral Vein: No evidence of thrombus. Normal compressibility, respiratory phasicity and response to augmentation. Saphenofemoral Junction: No evidence of thrombus. Normal compressibility and flow on color Doppler imaging. Profunda Femoral Vein: No evidence of thrombus. Normal compressibility and flow on color Doppler imaging. Femoral Vein: No evidence of thrombus. Normal compressibility, respiratory phasicity and response to augmentation. Popliteal Vein: No evidence of thrombus. Normal compressibility, respiratory phasicity and response to augmentation. Calf Veins: Limited evaluation due to edema. Superficial Great Saphenous Vein: No evidence of thrombus. Normal compressibility and flow on color Doppler imaging. Venous Reflux:  None. Other Findings:  None. IMPRESSION: No evidence of DVT within either lower extremity. Mild study limitations in the calf bilaterally due to edema. Electronically Signed   By: Andreas Newport M.D.   On: 04/08/2017 06:22   Dg Chest Port 1 View  Result Date: 04/09/2017 CLINICAL DATA:  Sepsis. EXAM: PORTABLE CHEST 1 VIEW COMPARISON:  04/08/2017. FINDINGS: Cardiomegaly with persistent pulmonary venous congestion with mild basilar interstitial edema and small bilateral pleural effusions. Basilar atelectasis. IMPRESSION: 1. Persistent cardiomegaly with mild pulmonary venous congestion and basilar interstitial edema. Small bilateral pleural effusions. 2. Persistent bibasilar atelectasis. Bibasilar pneumonia cannot be excluded . Electronically Signed   By: Marcello Moores  Register   On: 04/09/2017 06:42   Dg Chest Port 1 View  Result Date: 04/08/2017 CLINICAL DATA:  66 y/o  F; bilateral lower extremity edema. EXAM: PORTABLE CHEST 1 VIEW COMPARISON:  09/18/2014 chest radiograph FINDINGS: Moderate cardiomegaly. Pulmonary venous hypertension. Moderate right pleural effusion and right basilar opacity. No acute osseous abnormality is evident. IMPRESSION: Cardiomegaly. Pulmonary venous hypertension. Moderate right pleural effusion. Right basilar opacity may represent atelectasis or pneumonia. Electronically Signed   By: Kristine Garbe M.D.   On: 04/08/2017 02:20     Medications:   . albumin human    . dextrose 5% lactated ringers    . phenylephrine (NEO-SYNEPHRINE) Adult infusion 50 mcg/min (04/09/17 0915)  . phytonadione (VITAMIN K) IV    . sodium chloride    . vancomycin Stopped (04/08/17 1523)   . feeding supplement (ENSURE ENLIVE)  237 mL Oral TID  . levothyroxine  25 mcg Intravenous Daily  . silver sulfADIAZINE   Topical BID   [DISCONTINUED] ondansetron **OR** ondansetron (ZOFRAN) IV, zinc oxide  Assessment/ Plan:  [name] is a 66 y.o. white female  with hypertension, hypothyroidism, atrial fibrillation, who was admitted to [facility] on 04/08/2017 for evaluation of increasing lower extremity edema.  1. Acute renal failure with proteinuria and hematuria: baseline  creatinine of 0.62 09/2014.  Progressive peripheral edema and hypoalbuminemia. Concerning for nephrotic syndrome. Recent IV contrast exposure on 04/04/17.  - Will need renal biopsy when more stable.  - Serologic work up: SPEP/UPEP, free light chains, ANA, complements, ANCA, anti-GBM. - Also will check hepatitis panel.  - Currently with IV fluids but volume overloaded. Low threshold to start dialysis.  - Discussed case with husband  2. Hypotension: requiring vasopressors: phenylephrine. septic versus cardiogenic shock.  - Broad spectrum antibiotics. ID to be consulted.  - Echocardiogram pending  3. Hypothyroidism: TSH 9.3. Has not taken her levothyroxine for more than 9 months.   4. 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 Hemoglobin A1c 6.2% - continue glucose control.     LOS: [place], [place] 7/2/201811:20 AM

## 2017-04-09 NOTE — Progress Notes (Signed)
Subjective: Patient says she feels about the same today. Says her leg swelling is unchanged. No shortness of breath or any other symptoms.  Objective: Vital signs in last 24 hours: Temp:  [96.4 F (35.8 C)-97.8 F (36.6 C)] 97.8 F (36.6 C) (07/02 0400) Pulse Rate:  [25-216] 103 (07/02 0915) Resp:  [16-32] 26 (07/02 0915) BP: (58-108)/(30-82) 99/61 (07/02 0915) SpO2:  [88 %-100 %] 94 % (07/02 0915) Weight change:  Last BM Date: 04/08/17  Intake/Output from previous day: 07/01 0701 - 07/02 0700 In: 4985.1 [P.O.:600; I.V.:4135.1; IV Piggyback:250] Out: 240 [Urine:180; Stool:60] Intake/Output this shift: Total I/O In: 240 [P.O.:240] Out: 20 [Urine:20]  General appearance: no distress Resp: clear to auscultation bilaterally and normal percussion bilaterally Cardio: regular rate and rhythm, S1, S2 normal, no murmur, click, rub or gallop GI: soft, non-tender; bowel sounds normal; no masses,  no organomegaly Extremities: 2+ lower extremity edema bilaterally. Left lower extremity has multiple bullous lesions with exudate.  Lab Results:  Recent Labs  04/09/17 0003 04/09/17 0423  WBC 12.9* 13.1*  HGB 13.7 12.9  HCT 41.6 39.0  PLT 115* 91*   BMET  Recent Labs  04/09/17 0003 04/09/17 0423  NA 139 142  K 3.9 3.4*  CL 107 110  CO2 22 22  GLUCOSE 64* 71  BUN 32* 31*  CREATININE 2.18* 2.01*  CALCIUM 6.9* 6.7*    Studies/Results: [place] Chest  Result Date: 04/08/2017 CLINICAL DATA:  RIGHT pleural effusion EXAM: CHEST ULTRASOUND COMPARISON:  Chest radiograph 04/08/2017 FINDINGS: Small to moderate RIGHT pleural effusion identified. Patient imaged in LEFT lateral decubitus position, unable to sit. No loculations or septations identified. IMPRESSION: Small to moderate RIGHT pleural effusion. Electronically Signed   By: Lavonia Dana M.D.   On: 04/08/2017 11:20   US Renal  Result Date: 04/08/2017 CLINICAL DATA:  Acute renal failure EXAM: RENAL / URINARY TRACT ULTRASOUND COMPLETE  COMPARISON:  CT 04/04/2017 FINDINGS: Right Kidney: Length: 11.1 cm. Echogenicity within normal limits. No mass or hydronephrosis visualized. Left Kidney: Length: 10.9 cm. 1.9 cm cyst off the lower pole. Echogenicity within normal limits. No mass or hydronephrosis visualized. Bladder: Foley catheter in place, decompressed IMPRESSION: No acute findings. Electronically Signed   By: Rolm Baptise M.D.   On: 04/08/2017 16:18   US Venous Img Lower Bilateral  Result Date: 04/08/2017 CLINICAL DATA:  Bilateral lower extremity swelling for 2 weeks EXAM: BILATERAL LOWER EXTREMITY VENOUS DOPPLER ULTRASOUND TECHNIQUE: Gray-scale sonography with graded compression, as well as color Doppler and duplex ultrasound were performed to evaluate the lower extremity deep venous systems from the level of the common femoral vein and including the common femoral, femoral, profunda femoral, popliteal and calf veins including the posterior tibial, peroneal and gastrocnemius veins when visible. The superficial great saphenous vein was also interrogated. Spectral Doppler was utilized to evaluate flow at rest and with distal augmentation maneuvers in the common femoral, femoral and popliteal veins. COMPARISON:  None. FINDINGS: RIGHT LOWER EXTREMITY Common Femoral Vein: No evidence of thrombus. Normal compressibility, respiratory phasicity and response to augmentation. Saphenofemoral Junction: No evidence of thrombus. Normal compressibility and flow on color Doppler imaging. Profunda Femoral Vein: No evidence of thrombus. Normal compressibility and flow on color Doppler imaging. Femoral Vein: No evidence of thrombus. Normal compressibility, respiratory phasicity and response to augmentation. Popliteal Vein: No evidence of thrombus. Normal compressibility, respiratory phasicity and response to augmentation. Calf Veins: Limited evaluation due to edema. Superficial Great Saphenous Vein: No evidence of thrombus. Normal compressibility and flow on  color Doppler imaging. Venous Reflux:  None. Other Findings:  None. LEFT LOWER EXTREMITY Common Femoral Vein: No evidence of thrombus. Normal compressibility, respiratory phasicity and response to augmentation. Saphenofemoral Junction: No evidence of thrombus. Normal compressibility and flow on color Doppler imaging. Profunda Femoral Vein: No evidence of thrombus. Normal compressibility and flow on color Doppler imaging. Femoral Vein: No evidence of thrombus. Normal compressibility, respiratory phasicity and response to augmentation. Popliteal Vein: No evidence of thrombus. Normal compressibility, respiratory phasicity and response to augmentation. Calf Veins: Limited evaluation due to edema. Superficial Great Saphenous Vein: No evidence of thrombus. Normal compressibility and flow on color Doppler imaging. Venous Reflux:  None. Other Findings:  None. IMPRESSION: No evidence of DVT within either lower extremity. Mild study limitations in the calf bilaterally due to edema. Electronically Signed   By: Andreas Newport M.D.   On: 04/08/2017 06:22   Dg Chest Port 1 View  Result Date: 04/09/2017 CLINICAL DATA:  Sepsis. EXAM: PORTABLE CHEST 1 VIEW COMPARISON:  04/08/2017. FINDINGS: Cardiomegaly with persistent pulmonary venous congestion with mild basilar interstitial edema and small bilateral pleural effusions. Basilar atelectasis. IMPRESSION: 1. Persistent cardiomegaly with mild pulmonary venous congestion and basilar interstitial edema. Small bilateral pleural effusions. 2. Persistent bibasilar atelectasis. Bibasilar pneumonia cannot be excluded . Electronically Signed   By: Marcello Moores  Register   On: 04/09/2017 06:42   Dg Chest Port 1 View  Result Date: 04/08/2017 CLINICAL DATA:  66 y/o  F; bilateral lower extremity edema. EXAM: PORTABLE CHEST 1 VIEW COMPARISON:  09/18/2014 chest radiograph FINDINGS: Moderate cardiomegaly. Pulmonary venous hypertension. Moderate right pleural effusion and right basilar opacity. No  acute osseous abnormality is evident. IMPRESSION: Cardiomegaly. Pulmonary venous hypertension. Moderate right pleural effusion. Right basilar opacity may represent atelectasis or pneumonia. Electronically Signed   By: Kristine Garbe M.D.   On: 04/08/2017 02:20    Medications: I have reviewed the patient's current medications.  Assessment/Plan: 1. Sepsis. Likely secondary to lower extremity wound infections.  . 2. Lower history cellulitis. Patient is on Zosyn and finding. Cultures are pending. Believes lesions could possibly also represent rheumatologic disease. Serologies have been sent off. 3. Question will tongue swelling. This seems to resolve today. 4. Acute renal failure. Patient likely has nephrotic syndrome. She is losing much protein. Nephrology has been consulted. Will likely need dialysis. They cannot a renal biopsy at this time because of coagulopathy. 5. Hypokalemia. She was taking large amounts of Lasix to try to get lower extremity swelling down as an outpatient. Has been repleted 6. Lower extremity edema. Not sure what precipitated this. Perhaps nephrotic syndrome. Echocardiogram pending. Multiple serologies have been ordered to further assess.  7. Atrial fibrillation. She's on diltiazem for rate control. She's on full anticoagulation. However her INR is greatly elevated. She has received some vitamin K. We'll continue to monitor.  Total time spent 25 minutes  LOS: 1 day   Baxter Hire 04/09/2017, 1:10 PM

## 2017-04-09 NOTE — Progress Notes (Signed)
CRRT started approximately 2130 to right groin access. Ultrafiltration set to 130 initially related to alarms from machine, will tritiate up according to hemodynamics. Will monitor blood pressure, and continue to assess patient.

## 2017-04-09 NOTE — Progress Notes (Signed)
No improvement in urine output. Will proceed with CRRT: CVVHD. Discussed risks and benefits with family. Agreeing to proceed.   Lavonia Dana

## 2017-04-10 ENCOUNTER — Inpatient Hospital Stay: Payer: Medicare Other

## 2017-04-10 DIAGNOSIS — R6520 Severe sepsis without septic shock: Secondary | ICD-10-CM

## 2017-04-10 DIAGNOSIS — L03119 Cellulitis of unspecified part of limb: Secondary | ICD-10-CM

## 2017-04-10 LAB — PROTEIN ELECTRO, RANDOM URINE
ALBUMIN ELP UR: 41.8 %
Alpha-1-Globulin, U: 6.3 %
Alpha-2-Globulin, U: 8.9 %
BETA GLOBULIN, U: 30.2 %
Gamma Globulin, U: 12.8 %
TOTAL PROTEIN, URINE-UPE24: 146.1 mg/dL

## 2017-04-10 LAB — HEPATITIS B SURFACE ANTIGEN: Hepatitis B Surface Ag: NEGATIVE

## 2017-04-10 LAB — COMPREHENSIVE METABOLIC PANEL
ALBUMIN: 2 g/dL — AB (ref 3.5–5.0)
ALT: 24 U/L (ref 14–54)
ANION GAP: 8 (ref 5–15)
AST: 26 U/L (ref 15–41)
Alkaline Phosphatase: 107 U/L (ref 38–126)
BUN: 29 mg/dL — ABNORMAL HIGH (ref 6–20)
CALCIUM: 7.4 mg/dL — AB (ref 8.9–10.3)
CHLORIDE: 113 mmol/L — AB (ref 101–111)
CO2: 22 mmol/L (ref 22–32)
Creatinine, Ser: 1.5 mg/dL — ABNORMAL HIGH (ref 0.44–1.00)
GFR calc non Af Amer: 35 mL/min — ABNORMAL LOW (ref >60)
GFR, EST AFRICAN AMERICAN: 41 mL/min — AB (ref >60)
GLUCOSE: 167 mg/dL — AB (ref 65–99)
Potassium: 3.1 mmol/L — ABNORMAL LOW (ref 3.5–5.1)
SODIUM: 143 mmol/L (ref 135–145)
Total Bilirubin: 1.1 mg/dL (ref 0.3–1.2)
Total Protein: 4.3 g/dL — ABNORMAL LOW (ref 6.5–8.1)

## 2017-04-10 LAB — RENAL FUNCTION PANEL
ALBUMIN: 2.2 g/dL — AB (ref 3.5–5.0)
ALBUMIN: 2.7 g/dL — AB (ref 3.5–5.0)
ALBUMIN: 4.1 g/dL (ref 3.5–5.0)
ANION GAP: 6 (ref 5–15)
ANION GAP: 7 (ref 5–15)
ANION GAP: 10 (ref 5–15)
Albumin: 3.3 g/dL — ABNORMAL LOW (ref 3.5–5.0)
Albumin: 3.5 g/dL (ref 3.5–5.0)
Anion gap: 7 (ref 5–15)
Anion gap: 10 (ref 5–15)
BUN: 28 mg/dL — AB (ref 6–20)
BUN: 24 mg/dL — ABNORMAL HIGH (ref 6–20)
BUN: 21 mg/dL — AB (ref 6–20)
BUN: 16 mg/dL (ref 6–20)
BUN: 15 mg/dL (ref 6–20)
CALCIUM: 8.4 mg/dL — AB (ref 8.9–10.3)
CALCIUM: 8.6 mg/dL — AB (ref 8.9–10.3)
CHLORIDE: 106 mmol/L (ref 101–111)
CHLORIDE: 106 mmol/L (ref 101–111)
CO2: 23 mmol/L (ref 22–32)
CO2: 23 mmol/L (ref 22–32)
CO2: 24 mmol/L (ref 22–32)
CO2: 26 mmol/L (ref 22–32)
CO2: 26 mmol/L (ref 22–32)
CREATININE: 1.36 mg/dL — AB (ref 0.44–1.00)
CREATININE: 1.17 mg/dL — AB (ref 0.44–1.00)
CREATININE: 0.79 mg/dL (ref 0.44–1.00)
Calcium: 7.3 mg/dL — ABNORMAL LOW (ref 8.9–10.3)
Calcium: 7.8 mg/dL — ABNORMAL LOW (ref 8.9–10.3)
Calcium: 9.1 mg/dL (ref 8.9–10.3)
Chloride: 115 mmol/L — ABNORMAL HIGH (ref 101–111)
Chloride: 110 mmol/L (ref 101–111)
Chloride: 107 mmol/L (ref 101–111)
Creatinine, Ser: 1.54 mg/dL — ABNORMAL HIGH (ref 0.44–1.00)
Creatinine, Ser: 0.88 mg/dL (ref 0.44–1.00)
GFR calc Af Amer: >60 mL/min (ref >60)
GFR calc Af Amer: >60 mL/min (ref >60)
GFR calc non Af Amer: 40 mL/min — ABNORMAL LOW (ref >60)
GFR calc non Af Amer: >60 mL/min (ref >60)
GFR, EST AFRICAN AMERICAN: 40 mL/min — AB (ref >60)
GFR, EST AFRICAN AMERICAN: 46 mL/min — AB (ref >60)
GFR, EST AFRICAN AMERICAN: 55 mL/min — AB (ref >60)
GFR, EST NON AFRICAN AMERICAN: 34 mL/min — AB (ref >60)
GFR, EST NON AFRICAN AMERICAN: 48 mL/min — AB (ref >60)
GLUCOSE: 112 mg/dL — AB (ref 65–99)
Glucose, Bld: 165 mg/dL — ABNORMAL HIGH (ref 65–99)
Glucose, Bld: 153 mg/dL — ABNORMAL HIGH (ref 65–99)
Glucose, Bld: 126 mg/dL — ABNORMAL HIGH (ref 65–99)
Glucose, Bld: 99 mg/dL (ref 65–99)
PHOSPHORUS: 1.6 mg/dL — AB (ref 2.5–4.6)
PHOSPHORUS: 1.8 mg/dL — AB (ref 2.5–4.6)
PHOSPHORUS: 1.4 mg/dL — AB (ref 2.5–4.6)
POTASSIUM: 3.1 mmol/L — AB (ref 3.5–5.1)
POTASSIUM: 3.8 mmol/L (ref 3.5–5.1)
Phosphorus: 1.7 mg/dL — ABNORMAL LOW (ref 2.5–4.6)
Phosphorus: 1.5 mg/dL — ABNORMAL LOW (ref 2.5–4.6)
Potassium: 3.3 mmol/L — ABNORMAL LOW (ref 3.5–5.1)
Potassium: 3.5 mmol/L (ref 3.5–5.1)
Potassium: 3.6 mmol/L (ref 3.5–5.1)
SODIUM: 140 mmol/L (ref 135–145)
SODIUM: 141 mmol/L (ref 135–145)
SODIUM: 139 mmol/L (ref 135–145)
Sodium: 144 mmol/L (ref 135–145)
Sodium: 142 mmol/L (ref 135–145)

## 2017-04-10 LAB — PROTEIN ELECTROPHORESIS, SERUM
A/G RATIO SPE: 1.2 (ref 0.7–1.7)
ALPHA-1-GLOBULIN: 0.2 g/dL (ref 0.0–0.4)
Albumin ELP: 2.1 g/dL — ABNORMAL LOW (ref 2.9–4.4)
Alpha-2-Globulin: 0.6 g/dL (ref 0.4–1.0)
Beta Globulin: 0.6 g/dL — ABNORMAL LOW (ref 0.7–1.3)
GAMMA GLOBULIN: 0.4 g/dL (ref 0.4–1.8)
GLOBULIN, TOTAL: 1.8 g/dL — AB (ref 2.2–3.9)
M-Spike, %: 0.1 g/dL — ABNORMAL HIGH
TOTAL PROTEIN ELP: 3.9 g/dL — AB (ref 6.0–8.5)

## 2017-04-10 LAB — FIBRIN DERIVATIVES D-DIMER (ARMC ONLY): FIBRIN DERIVATIVES D-DIMER (ARMC): 1224.35 — AB (ref 0.00–499.00)

## 2017-04-10 LAB — MAGNESIUM
MAGNESIUM: 1.8 mg/dL (ref 1.7–2.4)
MAGNESIUM: 1.7 mg/dL (ref 1.7–2.4)
Magnesium: 1.9 mg/dL (ref 1.7–2.4)
Magnesium: 1.8 mg/dL (ref 1.7–2.4)

## 2017-04-10 LAB — C4 COMPLEMENT: COMPLEMENT C4, BODY FLUID: 21 mg/dL (ref 14–44)

## 2017-04-10 LAB — URINE CULTURE: Culture: NO GROWTH

## 2017-04-10 LAB — ECHOCARDIOGRAM COMPLETE
Height: 65 in
Weight: 2804.25 oz

## 2017-04-10 LAB — PROTIME-INR
INR: 2.65
Prothrombin Time: 28.8 seconds — ABNORMAL HIGH (ref 11.4–15.2)

## 2017-04-10 LAB — ANA W/REFLEX IF POSITIVE: Anti Nuclear Antibody(ANA): NEGATIVE

## 2017-04-10 LAB — PLATELET COUNT: Platelets: 49 10*3/uL — ABNORMAL LOW (ref 150–440)

## 2017-04-10 LAB — LACTIC ACID, PLASMA: LACTIC ACID, VENOUS: 2.7 mmol/L — AB (ref 0.5–1.9)

## 2017-04-10 LAB — MPO/PR-3 (ANCA) ANTIBODIES

## 2017-04-10 LAB — C3 COMPLEMENT: C3 Complement: 98 mg/dL (ref 82–167)

## 2017-04-10 LAB — FIBRINOGEN: FIBRINOGEN: 538 mg/dL — AB (ref 210–475)

## 2017-04-10 LAB — GLOMERULAR BASEMENT MEMBRANE ANTIBODIES: GBM AB: 2 U (ref 0–20)

## 2017-04-10 LAB — PROCALCITONIN: PROCALCITONIN: 43.68 ng/mL

## 2017-04-10 LAB — SEDIMENTATION RATE: Sed Rate: 21 mm/hr (ref 0–30)

## 2017-04-10 LAB — APTT: APTT: 107 s — AB (ref 24–36)

## 2017-04-10 MED ORDER — AMIODARONE HCL IN DEXTROSE 360-4.14 MG/200ML-% IV SOLN
60.0000 mg/h | INTRAVENOUS | Status: AC
  Administered 2017-04-10 (×2): 60 mg/h via INTRAVENOUS
  Filled 2017-04-10 (×2): qty 200

## 2017-04-10 MED ORDER — PIPERACILLIN-TAZOBACTAM 3.375 G IVPB
3.3750 g | Freq: Three times a day (TID) | INTRAVENOUS | Status: DC
  Administered 2017-04-10 – 2017-04-11 (×4): 3.375 g via INTRAVENOUS
  Filled 2017-04-10 (×6): qty 50

## 2017-04-10 MED ORDER — METOPROLOL TARTRATE 5 MG/5ML IV SOLN
2.5000 mg | Freq: Four times a day (QID) | INTRAVENOUS | Status: DC | PRN
  Administered 2017-04-10: 2.5 mg via INTRAVENOUS
  Filled 2017-04-10: qty 5

## 2017-04-10 MED ORDER — AMIODARONE LOAD VIA INFUSION
150.0000 mg | Freq: Once | INTRAVENOUS | Status: AC
  Administered 2017-04-10: 150 mg via INTRAVENOUS
  Filled 2017-04-10: qty 83.34

## 2017-04-10 MED ORDER — POTASSIUM PHOSPHATES 15 MMOLE/5ML IV SOLN
15.0000 mmol | Freq: Once | INTRAVENOUS | Status: AC
  Administered 2017-04-10: 15 mmol via INTRAVENOUS
  Filled 2017-04-10: qty 5

## 2017-04-10 MED ORDER — AMIODARONE HCL IN DEXTROSE 360-4.14 MG/200ML-% IV SOLN
30.0000 mg/h | INTRAVENOUS | Status: DC
  Administered 2017-04-10 – 2017-04-11 (×3): 30 mg/h via INTRAVENOUS
  Filled 2017-04-10 (×2): qty 200

## 2017-04-10 MED ORDER — AMIODARONE HCL IN DEXTROSE 360-4.14 MG/200ML-% IV SOLN
30.0000 mg/h | INTRAVENOUS | Status: DC
Start: 2017-04-10 — End: 2017-04-10

## 2017-04-10 MED ORDER — AMIODARONE IV BOLUS ONLY 150 MG/100ML
150.0000 mg | Freq: Once | INTRAVENOUS | Status: AC
  Administered 2017-04-10: 150 mg via INTRAVENOUS
  Filled 2017-04-10: qty 100

## 2017-04-10 MED ORDER — POTASSIUM PHOSPHATES 15 MMOLE/5ML IV SOLN
30.0000 mmol | Freq: Once | INTRAVENOUS | Status: AC
  Administered 2017-04-10: 30 mmol via INTRAVENOUS
  Filled 2017-04-10: qty 10

## 2017-04-10 MED ORDER — METOPROLOL TARTRATE 5 MG/5ML IV SOLN
2.5000 mg | INTRAVENOUS | Status: DC | PRN
  Administered 2017-04-10 – 2017-04-19 (×4): 2.5 mg via INTRAVENOUS
  Administered 2017-04-24: 5 mg via INTRAVENOUS
  Filled 2017-04-10 (×5): qty 5

## 2017-04-10 MED ORDER — AMIODARONE HCL IN DEXTROSE 360-4.14 MG/200ML-% IV SOLN: 60.0000 mg/h | INTRAVENOUS | Status: DC

## 2017-04-10 NOTE — Progress Notes (Signed)
Pharmacy Antibiotic Note  [name] is a 66 y.o. female admitted on 04/08/2017 with progressive edema with bullae and cellulitis and anasarca.  Pharmacy has been consulted for Zosyn dosing. Patient is currently requiring CRRT.   Plan: Zosyn 3.375 g EI q 8 hours.   Height: 5\' 5"  (165.1 cm) Weight: 189 lb 9.5 oz (86 kg) IBW/kg (Calculated) : 57  Temp (24hrs), Avg:97.1 F (36.2 C), Min:95 F (35 C), Max:98.7 F (37.1 C)   Recent Labs Lab 04/08/17 0139 04/08/17 0204 04/08/17 0509  04/09/17 0003 04/09/17 0423 04/09/17 1710 04/09/17 2135 04/10/17 0138 04/10/17 0533 04/10/17 0534 04/10/17 0932  WBC 13.2*  --   --   --  12.9* 13.1*  --   --   --   --   --   --   CREATININE 1.94*  --   --   < > 2.18* 2.01* 2.07*  2.28* 2.03* 1.50*  1.54*  --  1.36* 1.17*  LATICACIDVEN  --  5.2* 4.5*  --  4.3* 5.0*  --   --   --  2.7*  --   --   VANCORANDOM  --   --   --   --   --   --  18  --   --   --   --   --   < > = values in this interval not displayed.  Estimated Creatinine Clearance: 51.9 mL/min (A) (by C-G formula based on SCr of 1.17 mg/dL (H)).    No Known Allergies  Antimicrobials this admission: Vancomycin 7/1 >> 7/2 Zosyn 7/1 >> 7/2, 7/3 >>  Dose adjustments this admission:  Microbiology results: Wound Cx: NGTD UCx: NG BCx: NGTD GI PCR: negative C diff: negative MRSA PCR: negative  Thank you for allowing pharmacy to be a part of this patient's care.  Ulice Dash D 04/10/2017 4:09 PM

## 2017-04-10 NOTE — Progress Notes (Signed)
PULMONARY / CRITICAL CARE MEDICINE   Name: [name] MRN: [id number] DOB: 22-May-1951    ADMISSION DATE:  04/08/2017  PT PROFILE:   66 y.o. F with PMH of CAF, hypothyroidism admitted via ED to ICU with many days of progressive LE edema, blistering lesions on BLE, progressive weakness. Adm diagnosis of septic shock, cellulitis, AKI  MAJOR EVENTS/TEST RESULTS: 07/01 LE venous US: no DVT 07/01 Echocardiogram:   07/01 Nephrology consultation 07/02 ID consultation 07/02 CRRT initiated 07/03 Off vasopressors. Worsening tachycardia - amiodarone initiated   INDWELLING DEVICES:: R fem HD cath 07/02 >>   MICRO DATA: MRSA PCR 07/01 >> NEG Urine 07/01 >> NEG Blood 07/01 >>  C diff 07/01 >> NEG GI panel 07/02 >> NEG  ANTIMICROBIALS:  Vanc 07/01 >> 07/02 Pip-tazo 07/01 >>   SUBJECTIVE:  Cognition intact. No distress  VITAL SIGNS: BP (!) 84/63   Pulse 60   Temp 97.8 F (36.6 C) (Oral)   Resp 19   Ht 5\' 5"  (1.651 m)   Wt 189 lb 9.5 oz (86 kg)   SpO2 100%   BMI 31.55 kg/m   HEMODYNAMICS:    VENTILATOR SETTINGS:    INTAKE / OUTPUT: I/O last 3 completed shifts: In: 5563.6 [P.O.:360; I.V.:4653.6; IV Piggyback:550] Out: 2941 [Urine:510; Other:1331; Stool:1100]  PHYSICAL EXAMINATION: General: NAD Neuro: CNs intact, MAEs HEENT: NCAT, sclerae whhite Cardiovascular: IRIR, mildly tachy, no M Lungs: clear anteriorly Abdomen: obese, soft, NT Ext: severe BLE edema from hips down, multiple bilateral bullous lesions  LABS:  BMET  Recent Labs Lab 04/10/17 0138 04/10/17 0534 04/10/17 0932  NA 144 140 141  K 3.1* 3.3* 3.5  CL 115* 110 107  CO2 23 23 24   BUN 28* 24* 21*  CREATININE 1.54* 1.36* 1.17*  GLUCOSE 165* 153* 126*    Electrolytes  Recent Labs Lab 04/09/17 2135 04/10/17 0138 04/10/17 0534 04/10/17 0932  CALCIUM 6.9* 7.3* 7.8* 8.4*  MG 2.0  --  1.9 1.8  PHOS 1.8* 1.6* 1.8* 1.7*    CBC  Recent Labs Lab 04/08/17 0139 04/09/17 0003  04/09/17 0423 04/10/17 0534  WBC 13.2* 12.9* 13.1*  --   HGB 15.0 13.7 12.9  --   HCT 45.2 41.6 39.0  --   PLT 142* 115* 91* 49*    Coag's  Recent Labs Lab 04/08/17 1217 04/09/17 0003 04/10/17 0534  APTT 111* 113* 107*  INR 6.37* 5.67* 2.65    Sepsis Markers  Recent Labs Lab 04/09/17 0003 04/09/17 0423 04/09/17 1710 04/10/17 0533  LATICACIDVEN 4.3* 5.0*  --  2.7*  PROCALCITON  --   --  58.86  --     ABG No results for input(s): PHART, PCO2ART, PO2ART in the last 168 hours.  Liver Enzymes  Recent Labs Lab 04/08/17 0139  04/10/17 0138 04/10/17 0534 04/10/17 0932  AST 46*  --   --   --   --   ALT 44  --   --   --   --   ALKPHOS 166*  --   --   --   --   BILITOT 1.8*  --   --   --   --   ALBUMIN 2.8*  < > 2.2* 2.7* 3.3*  < > = values in this interval not displayed.  Cardiac Enzymes  Recent Labs Lab 04/08/17 0139  TROPONINI 0.05*    Glucose  Recent Labs Lab 04/08/17 0637 04/08/17 0719  GLUCAP 65 65    CXR: NSC moderate R pleural effusion  ASSESSMENT / PLAN:  PULMONARY A: Mild hypoxemia R pleural effusion P:   Cont supplemental O2 to maintain SpO2 > 92%  CARDIOVASCULAR A:  Chronic AF - now with RVR Septic shock, resolved P:  Phenylephrine as needed to maintain MAP > 65 mmHg Amiodarone initiated 07/03  RENAL A:   AKI, oliguric Mild metabolic acidosis P:   Monitor BMET intermittently Monitor I/Os Correct electrolytes as indicated CRRT per Nephrology Vasculitis serologies ordered  GASTROINTESTINAL A:   Diarrhea P:   SUP: N/I Cont reg diet  HEMATOLOGIC A:   Coagulopathy - improved Thrombocytopenia Chronic anticoagulation with Pradaxa P:  Holding Pradaxa Monitor DIC panel   INFECTIOUS A:   Elevated PCT Severe sepsis Presumed LE cellulitis P:   Monitor temp, WBC count Micro and abx as above  ENDOCRINE A:   Hyperglycemia, resolved Episodic hypoglycemia P:   Monitor glu on chem panels Consider SSI  for glu > 180  NEUROLOGIC A:   Mild delirium, resolved P:   RASS goal: 0 Minimize sedation   FAMILY  - Updates: Husband updated in detail   Merton Border, MD PCCM service Mobile [phone] Pager [phone] 04/10/2017 11:59 AM

## 2017-04-10 NOTE — Progress Notes (Signed)
Subjective: Patient says she feels better today. Currently on hemodialysis.  Objective: Vital signs in last 24 hours: Temp:  [95 F (35 C)-98.7 F (37.1 C)] 96.6 F (35.9 C) (07/03 1215) Pulse Rate:  [46-129] 108 (07/03 1400) Resp:  [11-32] 19 (07/03 1400) BP: (80-123)/(40-104) 82/65 (07/03 1400) SpO2:  [92 %-100 %] 100 % (07/03 1400) Weight:  [85.7 kg (188 lb 15 oz)-86 kg (189 lb 9.5 oz)] 86 kg (189 lb 9.5 oz) (07/03 0500) Weight change:  Last BM Date: 04/10/17  Intake/Output from previous day: 07/02 0701 - 07/03 0700 In: 2626.4 [P.O.:360; I.V.:1916.4; IV Piggyback:350] Out: 2811 [Urine:380; Stool:1100] Intake/Output this shift: Total I/O In: 510.1 [I.V.:360.1; IV Piggyback:150] Out: 1208 [Urine:20; OXBDZ:3299]  General appearance: no distress Resp: clear to auscultation bilaterally and normal percussion bilaterally Cardio: irregularly irregular rhythm GI: soft, non-tender; bowel sounds normal; no masses,  no organomegaly Extremities: 2+ lower extremity edema bilaterally. Multiple bullous lesions on both extremities left greater than right Neurologic: Grossly normal  Lab Results:  Recent Labs  04/09/17 0003 04/09/17 0423 04/10/17 0534  WBC 12.9* 13.1*  --   HGB 13.7 12.9  --   HCT 41.6 39.0  --   PLT 115* 91* 49*   BMET  Recent Labs  04/10/17 0534 04/10/17 0932  NA 140 141  K 3.3* 3.5  CL 110 107  CO2 23 24  GLUCOSE 153* 126*  BUN 24* 21*  CREATININE 1.36* 1.17*  CALCIUM 7.8* 8.4*    Studies/Results: US Renal  Result Date: 04/08/2017 CLINICAL DATA:  Acute renal failure EXAM: RENAL / URINARY TRACT ULTRASOUND COMPLETE COMPARISON:  CT 04/04/2017 FINDINGS: Right Kidney: Length: 11.1 cm. Echogenicity within normal limits. No mass or hydronephrosis visualized. Left Kidney: Length: 10.9 cm. 1.9 cm cyst off the lower pole. Echogenicity within normal limits. No mass or hydronephrosis visualized. Bladder: Foley catheter in place, decompressed IMPRESSION: No acute  findings. Electronically Signed   By: Rolm Baptise M.D.   On: 04/08/2017 16:18   Dg Chest Port 1 View  Result Date: 04/10/2017 CLINICAL DATA:  Respiratory failure. EXAM: PORTABLE CHEST 1 VIEW COMPARISON:  04/09/2017 . FINDINGS: Cardiomegaly with pulmonary vascular congestion and mild interstitial edema again noted. Bibasilar atelectasis. Bibasilar pneumonia cannot be excluded. Small bilateral pleural effusions again noted. No pneumothorax . IMPRESSION: 1. Cardiomegaly with mild pulmonary venous congestion interstitial edema. Small bilateral pleural effusions. No change. 2. Low lung volumes with bibasilar atelectasis. Bibasilar pneumonia cannot be excluded. Chest is unchanged from prior exam. Electronically Signed   By: Marcello Moores  Register   On: 04/10/2017 06:58   Dg Chest Port 1 View  Result Date: 04/09/2017 CLINICAL DATA:  Sepsis. EXAM: PORTABLE CHEST 1 VIEW COMPARISON:  04/08/2017. FINDINGS: Cardiomegaly with persistent pulmonary venous congestion with mild basilar interstitial edema and small bilateral pleural effusions. Basilar atelectasis. IMPRESSION: 1. Persistent cardiomegaly with mild pulmonary venous congestion and basilar interstitial edema. Small bilateral pleural effusions. 2. Persistent bibasilar atelectasis. Bibasilar pneumonia cannot be excluded . Electronically Signed   By: Marcello Moores  Register   On: 04/09/2017 06:42    Medications: I have reviewed the patient's current medications.  Assessment/Plan: 1. Sepsis with septic shock. Likely secondary to lower extremity wound infections. Currently on phenylephrine for support. Wean as tolerated  2. Lower extremity cellulitis. Patient is on Zosyn . Cultures unrevealing so far .  3. Acute renal failure. Patient likely has nephrotic syndrome. She is losing much protein. Dialysis started today. 4. Hypokalemia. She was taking large amounts of Lasix to try to  get lower extremity swelling down as an outpatient. Has been repleted 5. Lower extremity edema.  Not sure what precipitated this. Perhaps nephrotic syndrome. Echocardiogram pending. Multiple serologies have been ordered to further assess.  6. Atrial fibrillation. She's on diltiazem for rate control.  7. Coagulopathy. Suspect this could be DIC. Continue supportive care and treatment of underlying calls and monitor coags.  Total time spent 25 minutes  LOS: 2 days   Baxter Hire 04/10/2017, 3:05 PM

## 2017-04-10 NOTE — Progress Notes (Signed)
[facility]  ROUNDING NOTE   Subjective:   Husband at bedside.  Placed on CRRT last night. Net -114  Off phenylephrine.   In atrial fibrillation, started on amiodarone.  On D5 LR IVF last night.   Objective:  Vital signs in last 24 hours:  Temp:  [95 F (35 C)-98.7 F (37.1 C)] 97.8 F (36.6 C) (07/03 0805) Pulse Rate:  [46-129] 85 (07/03 1000) Resp:  [11-32] 22 (07/03 1000) BP: (80-123)/(40-82) 112/76 (07/03 1000) SpO2:  [87 %-100 %] 99 % (07/03 1000) Weight:  [85.7 kg (188 lb 15 oz)-86 kg (189 lb 9.5 oz)] 86 kg (189 lb 9.5 oz) (07/03 0500)  Weight change:  Filed Weights   04/08/17 0648 04/09/17 2000 04/10/17 0500  Weight: 79.5 kg (175 lb 4.3 oz) 85.7 kg (188 lb 15 oz) 86 kg (189 lb 9.5 oz)    Intake/Output: I/O last 3 completed shifts: In: 5563.6 [P.O.:360; I.V.:4653.6; IV Piggyback:550] Out: 2941 [Urine:510; Other:1331; Stool:1100]   Intake/Output this shift:  Total I/O In: 156.3 [I.V.:156.3] Out: 453 [Urine:20; Other:433]  Physical Exam: General: NAD, sitting up in bed  Head: Normocephalic, atraumatic. Moist oral mucosal membranes  Eyes: Anicteric, PERRL  Neck: Supple, trachea midline  Lungs:  Bilateral crackles  Heart: irregular  Abdomen:  Soft, nontender, obese  Extremities: 4+ weeping bullae lower extremity edema.  Neurologic: Nonfocal, moving all four extremities  Skin: Bullous lesions with serous anginous   Access: none    Basic Metabolic Panel:  Recent Labs Lab 04/09/17 0423 04/09/17 1710 04/09/17 2135 04/10/17 0138 04/10/17 0534 04/10/17 0932  NA 142 142  142 141 144 140 141  K 3.4* 3.3*  3.3* 3.1* 3.1* 3.3* 3.5  CL 110 113*  112* 113* 115* 110 107  CO2 22 19*  19* 20* 23 23 24   GLUCOSE 71 185*  185* 195* 165* 153* 126*  BUN 31* 34*  35* 34* 28* 24* 21*  CREATININE 2.01* 2.07*  2.28* 2.03* 1.54* 1.36* 1.17*  CALCIUM 6.7* 6.9*  7.0* 6.9* 7.3* 7.8* 8.4*  MG 1.7 2.0 2.0  --  1.9 1.8  PHOS 3.1 2.1* 1.8* 1.6* 1.8*  1.7*    Liver Function Tests:  Recent Labs Lab 04/08/17 0139 04/09/17 1710 04/09/17 2135 04/10/17 0138 04/10/17 0534 04/10/17 0932  AST 46*  --   --   --   --   --   ALT 44  --   --   --   --   --   ALKPHOS 166*  --   --   --   --   --   BILITOT 1.8*  --   --   --   --   --   PROT 5.5*  --   --   --   --   --   ALBUMIN 2.8* 2.2* 2.1* 2.2* 2.7* 3.3*   No results for input(s): LIPASE, AMYLASE in the last 168 hours. No results for input(s): AMMONIA in the last 168 hours.  CBC:  Recent Labs Lab 04/08/17 0139 04/09/17 0003 04/09/17 0423 04/10/17 0534  WBC 13.2* 12.9* 13.1*  --   NEUTROABS 12.0*  --   --   --   HGB 15.0 13.7 12.9  --   HCT 45.2 41.6 39.0  --   MCV 95.0 96.5 94.9  --   PLT 142* 115* 91* 49*    Cardiac Enzymes:  Recent Labs Lab 04/08/17 0139  TROPONINI 0.05*    BNP: Invalid input(s): POCBNP  CBG:  Recent Labs Lab 04/08/17 0637 04/08/17 0719  GLUCAP 65 27    Microbiology: Results for orders placed or performed during the hospital encounter of 04/08/17  Culture, blood (routine x 2)     Status: None (Preliminary result)   Collection Time: 04/08/17  2:04 AM  Result Value Ref Range Status   Specimen Description BLOOD RIGHT ANTECUBITAL  Final   Special Requests   Final    BOTTLES DRAWN AEROBIC AND ANAEROBIC Blood Culture adequate volume   Culture NO GROWTH 2 DAYS  Final   Report Status PENDING  Incomplete  Culture, blood (routine x 2)     Status: None (Preliminary result)   Collection Time: 04/08/17  2:06 AM  Result Value Ref Range Status   Specimen Description BLOOD LEFT ANTECUBITAL  Final   Special Requests   Final    BOTTLES DRAWN AEROBIC AND ANAEROBIC Blood Culture adequate volume   Culture NO GROWTH 2 DAYS  Final   Report Status PENDING  Incomplete  C difficile quick scan w PCR reflex     Status: None   Collection Time: 04/08/17  5:09 AM  Result Value Ref Range Status   C Diff antigen NEGATIVE NEGATIVE Final   C Diff toxin  NEGATIVE NEGATIVE Final   C Diff interpretation No C. difficile detected.  Final  MRSA PCR Screening     Status: None   Collection Time: 04/08/17  6:50 AM  Result Value Ref Range Status   MRSA by PCR NEGATIVE NEGATIVE Final    Comment:        The GeneXpert MRSA Assay (FDA approved for NASAL specimens only), is one component of a comprehensive MRSA colonization surveillance program. It is not intended to diagnose MRSA infection nor to guide or monitor treatment for MRSA infections.   Urine culture     Status: None   Collection Time: 04/08/17 12:16 PM  Result Value Ref Range Status   Specimen Description URINE, RANDOM  Final   Special Requests NONE  Final   Culture   Final    NO GROWTH Performed at [facility], [address] [place], [place] [address]    Report Status 04/10/2017 FINAL  Final  Aerobic Culture (superficial specimen)     Status: None (Preliminary result)   Collection Time: 04/09/17 11:32 AM  Result Value Ref Range Status   Specimen Description SKIN  Final   Special Requests NONE  Final   Gram Stain   Final    RARE WBC PRESENT, PREDOMINANTLY PMN NO SQUAMOUS EPITHELIAL CELLS SEEN NO ORGANISMS SEEN Performed at [facility], [place] [address] [place], [place] [address]    Culture PENDING  Incomplete   Report Status PENDING  Incomplete  Gastrointestinal Panel by PCR , Stool     Status: None   Collection Time: 04/09/17  5:10 PM  Result Value Ref Range Status   Campylobacter species NOT DETECTED NOT DETECTED Final   Plesimonas shigelloides NOT DETECTED NOT DETECTED Final   Salmonella species NOT DETECTED NOT DETECTED Final   Yersinia enterocolitica NOT DETECTED NOT DETECTED Final   Vibrio species NOT DETECTED NOT DETECTED Final   Vibrio cholerae NOT DETECTED NOT DETECTED Final   Enteroaggregative E coli (EAEC) NOT DETECTED NOT DETECTED Final   Enteropathogenic E coli (EPEC) NOT DETECTED NOT DETECTED Final   Enterotoxigenic E coli (ETEC) NOT  DETECTED NOT DETECTED Final   Shiga like toxin producing E coli (STEC) NOT DETECTED NOT DETECTED Final   Shigella/Enteroinvasive  E coli (EIEC) NOT DETECTED NOT DETECTED Final   Cryptosporidium NOT DETECTED NOT DETECTED Final   Cyclospora cayetanensis NOT DETECTED NOT DETECTED Final   Entamoeba histolytica NOT DETECTED NOT DETECTED Final   Giardia lamblia NOT DETECTED NOT DETECTED Final   Adenovirus F40/41 NOT DETECTED NOT DETECTED Final   Astrovirus NOT DETECTED NOT DETECTED Final   Norovirus GI/GII NOT DETECTED NOT DETECTED Final   Rotavirus A NOT DETECTED NOT DETECTED Final   Sapovirus (I, II, IV, and V) NOT DETECTED NOT DETECTED Final    Coagulation Studies:  Recent Labs  04/08/17 1217 04/09/17 0003 04/10/17 0534  LABPROT 58.1* 52.9* 28.8*  INR 6.37* 5.67* 2.65    Urinalysis:  Recent Labs  04/08/17 1218  COLORURINE AMBER*  LABSPEC 1.024  PHURINE 5.0  GLUCOSEU NEGATIVE  HGBUR MODERATE*  BILIRUBINUR NEGATIVE  KETONESUR NEGATIVE  PROTEINUR 100*  NITRITE NEGATIVE  LEUKOCYTESUR NEGATIVE      Imaging: [place] Chest  Result Date: 04/08/2017 CLINICAL DATA:  RIGHT pleural effusion EXAM: CHEST ULTRASOUND COMPARISON:  Chest radiograph 04/08/2017 FINDINGS: Small to moderate RIGHT pleural effusion identified. Patient imaged in LEFT lateral decubitus position, unable to sit. No loculations or septations identified. IMPRESSION: Small to moderate RIGHT pleural effusion. Electronically Signed   By: Lavonia Dana M.D.   On: 04/08/2017 11:20   US Renal  Result Date: 04/08/2017 CLINICAL DATA:  Acute renal failure EXAM: RENAL / URINARY TRACT ULTRASOUND COMPLETE COMPARISON:  CT 04/04/2017 FINDINGS: Right Kidney: Length: 11.1 cm. Echogenicity within normal limits. No mass or hydronephrosis visualized. Left Kidney: Length: 10.9 cm. 1.9 cm cyst off the lower pole. Echogenicity within normal limits. No mass or hydronephrosis visualized. Bladder: Foley catheter in place, decompressed IMPRESSION: No  acute findings. Electronically Signed   By: Rolm Baptise M.D.   On: 04/08/2017 16:18   Dg Chest Port 1 View  Result Date: 04/10/2017 CLINICAL DATA:  Respiratory failure. EXAM: PORTABLE CHEST 1 VIEW COMPARISON:  04/09/2017 . FINDINGS: Cardiomegaly with pulmonary vascular congestion and mild interstitial edema again noted. Bibasilar atelectasis. Bibasilar pneumonia cannot be excluded. Small bilateral pleural effusions again noted. No pneumothorax . IMPRESSION: 1. Cardiomegaly with mild pulmonary venous congestion interstitial edema. Small bilateral pleural effusions. No change. 2. Low lung volumes with bibasilar atelectasis. Bibasilar pneumonia cannot be excluded. Chest is unchanged from prior exam. Electronically Signed   By: Marcello Moores  Register   On: 04/10/2017 06:58   Dg Chest Port 1 View  Result Date: 04/09/2017 CLINICAL DATA:  Sepsis. EXAM: PORTABLE CHEST 1 VIEW COMPARISON:  04/08/2017. FINDINGS: Cardiomegaly with persistent pulmonary venous congestion with mild basilar interstitial edema and small bilateral pleural effusions. Basilar atelectasis. IMPRESSION: 1. Persistent cardiomegaly with mild pulmonary venous congestion and basilar interstitial edema. Small bilateral pleural effusions. 2. Persistent bibasilar atelectasis. Bibasilar pneumonia cannot be excluded . Electronically Signed   By: Marcello Moores  Register   On: 04/09/2017 06:42     Medications:   . albumin human 25 g (04/10/17 0543)  . amiodarone 60 mg/hr (04/10/17 1023)   Followed by  . amiodarone    . piperacillin-tazobactam (ZOSYN)  IV    . pureflow 3 each (04/09/17 2350)   . feeding supplement (ENSURE ENLIVE)  237 mL Oral TID  . levothyroxine  25 mcg Intravenous Daily  . silver sulfADIAZINE   Topical BID   heparin, metoprolol tartrate, [DISCONTINUED] ondansetron **OR** ondansetron (ZOFRAN) IV, zinc oxide  Assessment/ Plan:  [name] is a 66 y.o. white female  with hypertension, hypothyroidism, atrial fibrillation, who  was  admitted to [facility] on 04/08/2017 for evaluation of increasing lower extremity edema.  1. Acute renal failure with proteinuria and hematuria: baseline creatinine of 0.62 09/2014.  Progressive peripheral edema and hypoalbuminemia. Concerning for nephrotic syndrome. Recent IV contrast exposure on 04/04/17.  - May need renal biopsy when more stable.  - Serologic work up: compliments normal.  Pending: SPEP/UPEP, free light chains, ANA, complements, ANCA, anti-GBM, viral hepatitis panel - Continue CVVHD. Increase UF to 269mL/hr. Discontinue IV fluids.  - Discussed case with husband  2. Hypotension: now off vasopressors: phenylephrine. septic versus cardiogenic shock.  - Broad spectrum antibiotics" pip/tazo. Appreciate ID input.   - Echocardiogram pending  3. Hypothyroidism: TSH 9.3. Has not taken her levothyroxine for more than 9 months.   4. 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 Hemoglobin A1c 6.2% - continue glucose control.     LOS: Barry, [name] 7/3/201810:24 AM

## 2017-04-10 NOTE — Progress Notes (Signed)
  Amiodarone Drug - Drug Interaction Consult Note  Recommendations: If decision is made to resume digoxin while patient is on amiodarone, will need to reduce dose by 50%.   Amiodarone is metabolized by the cytochrome P450 system and therefore has the potential to cause many drug interactions. Amiodarone has an average plasma half-life of 50 days (range 20 to 100 days).   There is potential for drug interactions to occur several weeks or months after stopping treatment and the onset of drug interactions may be slow after initiating amiodarone.   []  Statins: Increased risk of myopathy. Simvastatin- restrict dose to 20mg  daily. Other statins: counsel patients to report any muscle pain or weakness immediately.  []  Anticoagulants: Amiodarone can increase anticoagulant effect. Consider warfarin dose reduction. Patients should be monitored closely and the dose of anticoagulant altered accordingly, remembering that amiodarone levels take several weeks to stabilize.  []  Antiepileptics: Amiodarone can increase plasma concentration of phenytoin, the dose should be reduced. Note that small changes in phenytoin dose can result in large changes in levels. Monitor patient and counsel on signs of toxicity.  []  Beta blockers: increased risk of bradycardia, AV block and myocardial depression. Sotalol - avoid concomitant use.  [x]   Calcium channel blockers (diltiazem and verapamil): increased risk of bradycardia, AV block and myocardial depression.  []   Cyclosporine: Amiodarone increases levels of cyclosporine. Reduced dose of cyclosporine is recommended.  [x]  Digoxin dose should be halved when amiodarone is started.  []  Diuretics: increased risk of cardiotoxicity if hypokalemia occurs.  []  Oral hypoglycemic agents (glyburide, glipizide, glimepiride): increased risk of hypoglycemia. Patient's glucose levels should be monitored closely when initiating amiodarone therapy.   []  Drugs that prolong the QT  interval:  Torsades de pointes risk may be increased with concurrent use - avoid if possible.  Monitor QTc, also keep magnesium/potassium WNL if concurrent therapy can't be avoided. Marland Kitchen Antibiotics: e.g. fluoroquinolones, erythromycin. . Antiarrhythmics: e.g. quinidine, procainamide, disopyramide, sotalol. . Antipsychotics: e.g. phenothiazines, haloperidol.  . Lithium, tricyclic antidepressants, and methadone. Thank You,  Ulice Dash D  04/10/2017 4:19 PM

## 2017-04-10 NOTE — Progress Notes (Signed)
Paged Nephrologist on call about lab work reported (low potassium and phosphorus, see results tab) Received new orders. See EMAR. Will continue to assess patient and monitor. Nephrologist verbalized to page back if potassium falls below 3.

## 2017-04-10 NOTE — Progress Notes (Signed)
[place] INFECTIOUS DISEASE PROGRESS NOTE Date of Admission:  04/08/2017     ID: [name] is a 66 y.o. female with sepsis  Active Problems:   Septic shock ([place])   Subjective: She is off pressors, on CRRT, legs less red. No fevers  ROS  Unable to obtain  Medications:  Antibiotics Given (last 72 hours)    Date/Time Action Medication Dose Rate   04/08/17 0318 New Bag/Given   piperacillin-tazobactam (ZOSYN) IVPB 3.375 g 3.375 g 100 mL/hr   04/08/17 0318 New Bag/Given   vancomycin (VANCOCIN) IVPB 1000 mg/200 mL premix 1,000 mg 200 mL/hr   04/08/17 0803 New Bag/Given   piperacillin-tazobactam (ZOSYN) IVPB 3.375 g 3.375 g 12.5 mL/hr   04/08/17 1423 New Bag/Given   vancomycin (VANCOCIN) IVPB 750 mg/150 ml premix 750 mg 150 mL/hr   04/08/17 1729 New Bag/Given   piperacillin-tazobactam (ZOSYN) IVPB 3.375 g 3.375 g 12.5 mL/hr   04/09/17 0256 New Bag/Given   piperacillin-tazobactam (ZOSYN) IVPB 3.375 g 3.375 g 12.5 mL/hr   04/09/17 0838 New Bag/Given   piperacillin-tazobactam (ZOSYN) IVPB 3.375 g 3.375 g 12.5 mL/hr   04/09/17 1902 New Bag/Given   vancomycin (VANCOCIN) IVPB 750 mg/150 ml premix 750 mg 150 mL/hr   04/10/17 1057 New Bag/Given   piperacillin-tazobactam (ZOSYN) IVPB 3.375 g 3.375 g 12.5 mL/hr     . feeding supplement (ENSURE ENLIVE)  237 mL Oral TID  . levothyroxine  25 mcg Intravenous Daily  . silver sulfADIAZINE   Topical BID    Objective: Vital signs in last 24 hours: Temp:  [95 F (35 C)-98.7 F (37.1 C)] 97.4 F (36.3 C) (07/03 1630) Pulse Rate:  [46-129] 48 (07/03 1630) Resp:  [11-32] 22 (07/03 1630) BP: (82-142)/(40-130) 142/130 (07/03 1630) SpO2:  [95 %-100 %] 98 % (07/03 1630) Weight:  [85.7 kg (188 lb 15 oz)-86 kg (189 lb 9.5 oz)] 86 kg (189 lb 9.5 oz) (07/03 0500) Constitutional:  oriented to person, place, and time but somewhat slowed mentation. She is ill appearing.  HENT: [place]/AT, PERRLA, no scleral icterus, Mouth/Throat: Oropharynx clear   Cardiovascular:Tachy, 2/6 sm Pulmonary/Chest: dec bs bil bases Neck = supple, no nuchal rigidity Abdominal: Soft. Bowel sounds are normal.  exhibits no distension. There is no tenderness.  Lymphadenopathy: no cervical adenopathy. No axillary adenopathy Neurological: alert and oriented to person, place, and time.  HD cath RLE  Ext 3+ edema bil le Skin: bruising on L inner thigh, Bil LE wrapped but Dr Alva Garnet and RN describe ruptured bullae Psychiatric: a normal mood and affect.  behavior is normal.   Lab Results  Recent Labs  04/09/17 0003 04/09/17 0423  04/10/17 0534 04/10/17 0932  WBC 12.9* 13.1*  --   --   --   HGB 13.7 12.9  --   --   --   HCT 41.6 39.0  --   --   --   NA 139 142  < > 140 141  K 3.9 3.4*  < > 3.3* 3.5  CL 107 110  < > 110 107  CO2 22 22  < > 23 24  BUN 32* 31*  < > 24* 21*  CREATININE 2.18* 2.01*  < > 1.36* 1.17*  < > = values in this interval not displayed.  Microbiology: Results for orders placed or performed during the hospital encounter of 04/08/17  Culture, blood (routine x 2)     Status: None (Preliminary result)   Collection Time: 04/08/17  2:04 AM  Result Value  Ref Range Status   Specimen Description BLOOD RIGHT ANTECUBITAL  Final   Special Requests   Final    BOTTLES DRAWN AEROBIC AND ANAEROBIC Blood Culture adequate volume   Culture NO GROWTH 2 DAYS  Final   Report Status PENDING  Incomplete  Culture, blood (routine x 2)     Status: None (Preliminary result)   Collection Time: 04/08/17  2:06 AM  Result Value Ref Range Status   Specimen Description BLOOD LEFT ANTECUBITAL  Final   Special Requests   Final    BOTTLES DRAWN AEROBIC AND ANAEROBIC Blood Culture adequate volume   Culture NO GROWTH 2 DAYS  Final   Report Status PENDING  Incomplete  C difficile quick scan w PCR reflex     Status: None   Collection Time: 04/08/17  5:09 AM  Result Value Ref Range Status   C Diff antigen NEGATIVE NEGATIVE Final   C Diff toxin NEGATIVE NEGATIVE  Final   C Diff interpretation No C. difficile detected.  Final  MRSA PCR Screening     Status: None   Collection Time: 04/08/17  6:50 AM  Result Value Ref Range Status   MRSA by PCR NEGATIVE NEGATIVE Final    Comment:        The GeneXpert MRSA Assay (FDA approved for NASAL specimens only), is one component of a comprehensive MRSA colonization surveillance program. It is not intended to diagnose MRSA infection nor to guide or monitor treatment for MRSA infections.   Urine culture     Status: None   Collection Time: 04/08/17 12:16 PM  Result Value Ref Range Status   Specimen Description URINE, RANDOM  Final   Special Requests NONE  Final   Culture   Final    NO GROWTH Performed at [facility], [address], [place], [place] [address]    Report Status 04/10/2017 FINAL  Final  Aerobic Culture (superficial specimen)     Status: None (Preliminary result)   Collection Time: 04/09/17 11:32 AM  Result Value Ref Range Status   Specimen Description SKIN  Final   Special Requests NONE  Final   Gram Stain   Final    RARE WBC PRESENT, PREDOMINANTLY PMN NO SQUAMOUS EPITHELIAL CELLS SEEN NO ORGANISMS SEEN    Culture   Final    NO GROWTH 1 DAY Performed at [facility], [place] [address], [place],  [address]    Report Status PENDING  Incomplete  Gastrointestinal Panel by PCR , Stool     Status: None   Collection Time: 04/09/17  5:10 PM  Result Value Ref Range Status   Campylobacter species NOT DETECTED NOT DETECTED Final   Plesimonas shigelloides NOT DETECTED NOT DETECTED Final   Salmonella species NOT DETECTED NOT DETECTED Final   Yersinia enterocolitica NOT DETECTED NOT DETECTED Final   Vibrio species NOT DETECTED NOT DETECTED Final   Vibrio cholerae NOT DETECTED NOT DETECTED Final   Enteroaggregative E coli (EAEC) NOT DETECTED NOT DETECTED Final   Enteropathogenic E coli (EPEC) NOT DETECTED NOT DETECTED Final   Enterotoxigenic E coli (ETEC) NOT DETECTED NOT  DETECTED Final   Shiga like toxin producing E coli (STEC) NOT DETECTED NOT DETECTED Final   Shigella/Enteroinvasive E coli (EIEC) NOT DETECTED NOT DETECTED Final   Cryptosporidium NOT DETECTED NOT DETECTED Final   Cyclospora cayetanensis NOT DETECTED NOT DETECTED Final   Entamoeba histolytica NOT DETECTED NOT DETECTED Final   Giardia lamblia NOT DETECTED NOT DETECTED Final  Adenovirus F40/41 NOT DETECTED NOT DETECTED Final   Astrovirus NOT DETECTED NOT DETECTED Final   Norovirus GI/GII NOT DETECTED NOT DETECTED Final   Rotavirus A NOT DETECTED NOT DETECTED Final   Sapovirus (I, II, IV, and V) NOT DETECTED NOT DETECTED Final    Studies/Results: Dg Chest Port 1 View  Result Date: 04/10/2017 CLINICAL DATA:  Respiratory failure. EXAM: PORTABLE CHEST 1 VIEW COMPARISON:  04/09/2017 . FINDINGS: Cardiomegaly with pulmonary vascular congestion and mild interstitial edema again noted. Bibasilar atelectasis. Bibasilar pneumonia cannot be excluded. Small bilateral pleural effusions again noted. No pneumothorax . IMPRESSION: 1. Cardiomegaly with mild pulmonary venous congestion interstitial edema. Small bilateral pleural effusions. No change. 2. Low lung volumes with bibasilar atelectasis. Bibasilar pneumonia cannot be excluded. Chest is unchanged from prior exam. Electronically Signed   By: Marcello Moores  Register   On: 04/10/2017 06:58   Dg Chest Port 1 View  Result Date: 04/09/2017 CLINICAL DATA:  Sepsis. EXAM: PORTABLE CHEST 1 VIEW COMPARISON:  04/08/2017. FINDINGS: Cardiomegaly with persistent pulmonary venous congestion with mild basilar interstitial edema and small bilateral pleural effusions. Basilar atelectasis. IMPRESSION: 1. Persistent cardiomegaly with mild pulmonary venous congestion and basilar interstitial edema. Small bilateral pleural effusions. 2. Persistent bibasilar atelectasis. Bibasilar pneumonia cannot be excluded . Electronically Signed   By: Marcello Moores  Register   On: 04/09/2017 06:42   Study  Conclusions  - Left ventricle: The cavity size was mildly dilated. There was   mild concentric hypertrophy. Systolic function was severely   reduced. The estimated ejection fraction was in the range of 25%   to 30%. - Aortic valve: There was moderate regurgitation. - Mitral valve: There was mild regurgitation. - Left atrium: The atrium was mildly dilated. - Right atrium: The atrium was mildly dilated. - Tricuspid valve: There was moderate regurgitation.  Assessment/Plan: [name] is a 66 y.o. female admitted with 1-2 months of progressive LE edema, progressive to bullae and cellulitis, hypotension as well as bil pleural effusions. Labs are significant for coagulopathy, TCP,  elevated BNP, acute renal failure, low albumin, elevated lactate, proteinuria, marked elevated procalcitonin.  WBC only mildly elevated, no fevers. Gibson neg.   Her strep PNA urinary ag is also + but bcx pending. CT scan done a few days prior to admit shows effusion, possible R consolidation, gallstones and subq edema on Abd wall.   She certainly has LE cellulitis  but this is related to the edema and not the primary underlying etiology. Gram stain is neg on fluid from bullae. Also likely PNA based on CXR and urinary ag + S pna.  Echo shows severe systolic CHF, Mod AR, Mod TR.   She has not unusual exposures, no tick exposures. Endocarditis is a possibility but echo and bcx neg so far.  BCX ucx neg. HIV neg, ESR 21. C diff and stool PCR neg.  Recommendations continue zosyn for now pending bcx and wound cx. Continue renal wu and volume control.  Thank you very much for the consult. Will follow with you.  [place],  P   04/10/2017, 5:09 PM

## 2017-04-10 NOTE — Progress Notes (Signed)
Ultrafiltration rate increased to 200 per verbal order from Dr.Kolluru. Will continue to assess.

## 2017-04-11 ENCOUNTER — Inpatient Hospital Stay: Payer: Medicare Other

## 2017-04-11 LAB — CBC WITH DIFFERENTIAL/PLATELET
BAND NEUTROPHILS: 1 %
BASOS ABS: 0 10*3/uL (ref 0–0.1)
BASOS PCT: 0 %
BLASTS: 0 %
Band Neutrophils: 3 %
Basophils Absolute: 0 10*3/uL (ref 0–0.1)
Basophils Relative: 0 %
Blasts: 0 %
EOS ABS: 0 10*3/uL (ref 0–0.7)
EOS PCT: 0 %
EOS PCT: 0 %
Eosinophils Absolute: 0 10*3/uL (ref 0–0.7)
HCT: 23.2 % — ABNORMAL LOW (ref 35.0–47.0)
HEMATOCRIT: 21.3 % — AB (ref 35.0–47.0)
HEMOGLOBIN: 7.6 g/dL — AB (ref 12.0–16.0)
Hemoglobin: 6.8 g/dL — ABNORMAL LOW (ref 12.0–16.0)
LYMPHS ABS: 2 10*3/uL (ref 1.0–3.6)
LYMPHS ABS: 0.5 10*3/uL — AB (ref 1.0–3.6)
LYMPHS PCT: 20 %
Lymphocytes Relative: 4 %
MCH: 31.1 pg (ref 26.0–34.0)
MCH: 31.5 pg (ref 26.0–34.0)
MCHC: 32.2 g/dL (ref 32.0–36.0)
MCHC: 32.6 g/dL (ref 32.0–36.0)
MCV: 96.7 fL (ref 80.0–100.0)
MCV: 96.7 fL (ref 80.0–100.0)
METAMYELOCYTES PCT: 2 %
MONO ABS: 0.2 10*3/uL (ref 0.2–0.9)
MONOS PCT: 2 %
MONOS PCT: 4 %
MYELOCYTES: 0 %
Metamyelocytes Relative: 1 %
Monocytes Absolute: 0.5 10*3/uL (ref 0.2–0.9)
Myelocytes: 0 %
NEUTROS PCT: 76 %
NEUTROS PCT: 87 %
NRBC: 1 /100{WBCs} — AB
NRBC: 1 /100{WBCs} — AB
Neutro Abs: 8 10*3/uL — ABNORMAL HIGH (ref 1.4–6.5)
Neutro Abs: 12 10*3/uL — ABNORMAL HIGH (ref 1.4–6.5)
OTHER: 0 %
Other: 0 %
Platelets: 31 10*3/uL — ABNORMAL LOW (ref 150–440)
Platelets: 38 10*3/uL — ABNORMAL LOW (ref 150–440)
Promyelocytes Absolute: 0 %
Promyelocytes Absolute: 0 %
RBC: 2.2 MIL/uL — ABNORMAL LOW (ref 3.80–5.20)
RBC: 2.4 MIL/uL — AB (ref 3.80–5.20)
RDW: 17.1 % — AB (ref 11.5–14.5)
RDW: 16.6 % — ABNORMAL HIGH (ref 11.5–14.5)
WBC: 10.2 10*3/uL (ref 3.6–11.0)
WBC: 13 10*3/uL — AB (ref 3.6–11.0)

## 2017-04-11 LAB — COMPREHENSIVE METABOLIC PANEL
ALBUMIN: 3.1 g/dL — AB (ref 3.5–5.0)
ALK PHOS: 214 U/L — AB (ref 38–126)
ALT: 823 U/L — ABNORMAL HIGH (ref 14–54)
ANION GAP: 13 (ref 5–15)
AST: 1174 U/L — ABNORMAL HIGH (ref 15–41)
BUN: 15 mg/dL (ref 6–20)
CALCIUM: 7.8 mg/dL — AB (ref 8.9–10.3)
CHLORIDE: 103 mmol/L (ref 101–111)
CO2: 21 mmol/L — AB (ref 22–32)
Creatinine, Ser: 1.06 mg/dL — ABNORMAL HIGH (ref 0.44–1.00)
GFR calc non Af Amer: 54 mL/min — ABNORMAL LOW (ref >60)
GLUCOSE: 340 mg/dL — AB (ref 65–99)
POTASSIUM: 6 mmol/L — AB (ref 3.5–5.1)
SODIUM: 137 mmol/L (ref 135–145)
Total Bilirubin: 3.3 mg/dL — ABNORMAL HIGH (ref 0.3–1.2)
Total Protein: 4.6 g/dL — ABNORMAL LOW (ref 6.5–8.1)

## 2017-04-11 LAB — RENAL FUNCTION PANEL
ALBUMIN: 4.3 g/dL (ref 3.5–5.0)
ALBUMIN: 4 g/dL (ref 3.5–5.0)
ALBUMIN: 4.4 g/dL (ref 3.5–5.0)
ANION GAP: 9 (ref 5–15)
Albumin: 3.5 g/dL (ref 3.5–5.0)
Anion gap: 10 (ref 5–15)
Anion gap: 13 (ref 5–15)
Anion gap: 15 (ref 5–15)
BUN: 12 mg/dL (ref 6–20)
BUN: 14 mg/dL (ref 6–20)
BUN: 16 mg/dL (ref 6–20)
BUN: 12 mg/dL (ref 6–20)
CALCIUM: 9.4 mg/dL (ref 8.9–10.3)
CHLORIDE: 108 mmol/L (ref 101–111)
CO2: 25 mmol/L (ref 22–32)
CO2: 23 mmol/L (ref 22–32)
CO2: 25 mmol/L (ref 22–32)
CO2: 20 mmol/L — AB (ref 22–32)
CREATININE: 0.79 mg/dL (ref 0.44–1.00)
Calcium: 9.1 mg/dL (ref 8.9–10.3)
Calcium: 8.9 mg/dL (ref 8.9–10.3)
Calcium: 7.9 mg/dL — ABNORMAL LOW (ref 8.9–10.3)
Chloride: 105 mmol/L (ref 101–111)
Chloride: 105 mmol/L (ref 101–111)
Chloride: 105 mmol/L (ref 101–111)
Creatinine, Ser: 0.75 mg/dL (ref 0.44–1.00)
Creatinine, Ser: 0.7 mg/dL (ref 0.44–1.00)
Creatinine, Ser: 0.96 mg/dL (ref 0.44–1.00)
GFR calc Af Amer: >60 mL/min (ref >60)
GFR calc Af Amer: >60 mL/min (ref >60)
GFR calc Af Amer: >60 mL/min (ref >60)
GFR calc non Af Amer: >60 mL/min (ref >60)
GFR calc non Af Amer: >60 mL/min (ref >60)
GFR calc non Af Amer: >60 mL/min (ref >60)
GLUCOSE: 113 mg/dL — AB (ref 65–99)
Glucose, Bld: 128 mg/dL — ABNORMAL HIGH (ref 65–99)
Glucose, Bld: 89 mg/dL (ref 65–99)
Glucose, Bld: 173 mg/dL — ABNORMAL HIGH (ref 65–99)
PHOSPHORUS: 2 mg/dL — AB (ref 2.5–4.6)
PHOSPHORUS: 3 mg/dL (ref 2.5–4.6)
PHOSPHORUS: 2.4 mg/dL — AB (ref 2.5–4.6)
POTASSIUM: 4.1 mmol/L (ref 3.5–5.1)
POTASSIUM: 4.4 mmol/L (ref 3.5–5.1)
POTASSIUM: 4.2 mmol/L (ref 3.5–5.1)
Phosphorus: 3.2 mg/dL (ref 2.5–4.6)
Potassium: 4.7 mmol/L (ref 3.5–5.1)
SODIUM: 140 mmol/L (ref 135–145)
SODIUM: 139 mmol/L (ref 135–145)
SODIUM: 141 mmol/L (ref 135–145)
Sodium: 143 mmol/L (ref 135–145)

## 2017-04-11 LAB — GLUCOSE, CAPILLARY
Glucose-Capillary: 21 mg/dL — CL (ref 65–99)
Glucose-Capillary: 215 mg/dL — ABNORMAL HIGH (ref 65–99)
Glucose-Capillary: 171 mg/dL — ABNORMAL HIGH (ref 65–99)
Glucose-Capillary: 214 mg/dL — ABNORMAL HIGH (ref 65–99)

## 2017-04-11 LAB — AEROBIC CULTURE W GRAM STAIN (SUPERFICIAL SPECIMEN)

## 2017-04-11 LAB — PHOSPHORUS: Phosphorus: 4.7 mg/dL — ABNORMAL HIGH (ref 2.5–4.6)

## 2017-04-11 LAB — BLOOD GAS, ARTERIAL
Acid-base deficit: 7.4 mmol/L — ABNORMAL HIGH (ref 0.0–2.0)
BICARBONATE: 20 mmol/L (ref 20.0–28.0)
FIO2: 0.7
LHR: 16 {breaths}/min
MECHVT: 500 mL
O2 Saturation: 80.5 %
PATIENT TEMPERATURE: 37
PEEP/CPAP: 5 cmH2O
PO2 ART: 55 mmHg — AB (ref 83.0–108.0)
pCO2 arterial: 50 mmHg — ABNORMAL HIGH (ref 32.0–48.0)
pH, Arterial: 7.21 — ABNORMAL LOW (ref 7.350–7.450)

## 2017-04-11 LAB — BASIC METABOLIC PANEL
ANION GAP: 13 (ref 5–15)
BUN: 16 mg/dL (ref 6–20)
CHLORIDE: 108 mmol/L (ref 101–111)
CO2: 21 mmol/L — ABNORMAL LOW (ref 22–32)
Calcium: 7.9 mg/dL — ABNORMAL LOW (ref 8.9–10.3)
Creatinine, Ser: 0.96 mg/dL (ref 0.44–1.00)
GFR calc Af Amer: >60 mL/min (ref >60)
Glucose, Bld: 173 mg/dL — ABNORMAL HIGH (ref 65–99)
POTASSIUM: 4.2 mmol/L (ref 3.5–5.1)
Sodium: 142 mmol/L (ref 135–145)

## 2017-04-11 LAB — MAGNESIUM
MAGNESIUM: 1.8 mg/dL (ref 1.7–2.4)
MAGNESIUM: 1.7 mg/dL (ref 1.7–2.4)
Magnesium: 1.8 mg/dL (ref 1.7–2.4)
Magnesium: 1.8 mg/dL (ref 1.7–2.4)
Magnesium: 1.8 mg/dL (ref 1.7–2.4)

## 2017-04-11 LAB — AEROBIC CULTURE  (SUPERFICIAL SPECIMEN): CULTURE: NO GROWTH

## 2017-04-11 LAB — LEGIONELLA PNEUMOPHILA SEROGP 1 UR AG: L. pneumophila Serogp 1 Ur Ag: NEGATIVE

## 2017-04-11 LAB — APTT
APTT: 141 s — AB (ref 24–36)
aPTT: 76 seconds — ABNORMAL HIGH (ref 24–36)

## 2017-04-11 LAB — PROTIME-INR
INR: 4.27 — AB
PROTHROMBIN TIME: 42.2 s — AB (ref 11.4–15.2)

## 2017-04-11 LAB — PROCALCITONIN: Procalcitonin: 20.01 ng/mL

## 2017-04-11 MED ORDER — FENTANYL BOLUS VIA INFUSION
25.0000 ug | INTRAVENOUS | Status: DC | PRN
  Administered 2017-04-12 – 2017-04-14 (×3): 25 ug via INTRAVENOUS
  Filled 2017-04-11: qty 25

## 2017-04-11 MED ORDER — IPRATROPIUM-ALBUTEROL 0.5-2.5 (3) MG/3ML IN SOLN
3.0000 mL | Freq: Four times a day (QID) | RESPIRATORY_TRACT | Status: DC
  Administered 2017-04-11 – 2017-04-19 (×30): 3 mL via RESPIRATORY_TRACT
  Filled 2017-04-11 (×30): qty 3

## 2017-04-11 MED ORDER — FENTANYL CITRATE (PF) 100 MCG/2ML IJ SOLN
50.0000 ug | Freq: Once | INTRAMUSCULAR | Status: AC
  Administered 2017-04-11: 50 ug via INTRAVENOUS
  Filled 2017-04-11: qty 2

## 2017-04-11 MED ORDER — PIPERACILLIN-TAZOBACTAM 3.375 G IVPB
3.3750 g | Freq: Three times a day (TID) | INTRAVENOUS | Status: DC
  Administered 2017-04-11 – 2017-04-17 (×17): 3.375 g via INTRAVENOUS
  Filled 2017-04-11 (×12): qty 50

## 2017-04-11 MED ORDER — MIDAZOLAM HCL 2 MG/2ML IJ SOLN
INTRAMUSCULAR | Status: AC
  Administered 2017-04-11: 2 mg via INTRAVENOUS
  Filled 2017-04-11: qty 2

## 2017-04-11 MED ORDER — VECURONIUM BROMIDE 10 MG IV SOLR
10.0000 mg | Freq: Once | INTRAVENOUS | Status: AC
  Administered 2017-04-11: 10 mg via INTRAVENOUS

## 2017-04-11 MED ORDER — CHLORHEXIDINE GLUCONATE 0.12% ORAL RINSE (MEDLINE KIT)
15.0000 mL | Freq: Two times a day (BID) | OROMUCOSAL | Status: DC
  Administered 2017-04-11 – 2017-04-16 (×10): 15 mL via OROMUCOSAL

## 2017-04-11 MED ORDER — NOREPINEPHRINE BITARTRATE 1 MG/ML IV SOLN
0.0000 ug/min | INTRAVENOUS | Status: DC
  Administered 2017-04-11: 10 ug/min via INTRAVENOUS
  Administered 2017-04-12: 15 ug/min via INTRAVENOUS
  Filled 2017-04-11 (×3): qty 16

## 2017-04-11 MED ORDER — FENTANYL 2500MCG IN NS 250ML (10MCG/ML) PREMIX INFUSION
25.0000 ug/h | INTRAVENOUS | Status: DC
  Administered 2017-04-11 – 2017-04-15 (×3): 25 ug/h via INTRAVENOUS
  Filled 2017-04-11 (×2): qty 250

## 2017-04-11 MED ORDER — ORAL CARE MOUTH RINSE
15.0000 mL | OROMUCOSAL | Status: DC
  Administered 2017-04-11 – 2017-04-16 (×50): 15 mL via OROMUCOSAL

## 2017-04-11 MED ORDER — SERTRALINE HCL 50 MG PO TABS
25.0000 mg | ORAL_TABLET | Freq: Every day | ORAL | Status: DC
  Administered 2017-04-11 – 2017-04-20 (×10): 25 mg via ORAL
  Filled 2017-04-11 (×11): qty 1

## 2017-04-11 MED ORDER — PIPERACILLIN-TAZOBACTAM 3.375 G IVPB
3.3750 g | Freq: Two times a day (BID) | INTRAVENOUS | Status: DC
  Filled 2017-04-11 (×2): qty 50

## 2017-04-11 MED ORDER — SODIUM CHLORIDE 0.9 % IV SOLN
0.0000 ug/min | INTRAVENOUS | Status: DC
  Administered 2017-04-11: 20 ug/min via INTRAVENOUS
  Filled 2017-04-11: qty 1

## 2017-04-11 MED ORDER — FENTANYL CITRATE (PF) 100 MCG/2ML IJ SOLN
100.0000 ug | Freq: Once | INTRAMUSCULAR | Status: AC
  Administered 2017-04-11: 100 ug via INTRAVENOUS

## 2017-04-11 MED ORDER — FENTANYL CITRATE (PF) 100 MCG/2ML IJ SOLN
INTRAMUSCULAR | Status: AC
  Administered 2017-04-11: 100 ug via INTRAVENOUS
  Filled 2017-04-11: qty 2

## 2017-04-11 MED ORDER — STERILE WATER FOR INJECTION IJ SOLN
INTRAMUSCULAR | Status: AC
  Administered 2017-04-11: 17:00:00
  Filled 2017-04-11: qty 10

## 2017-04-11 MED ORDER — DEXTROSE 50 % IV SOLN
1.0000 | Freq: Once | INTRAVENOUS | Status: AC
  Administered 2017-04-11: 50 mL via INTRAVENOUS

## 2017-04-11 MED ORDER — INSULIN ASPART 100 UNIT/ML ~~LOC~~ SOLN
10.0000 [IU] | Freq: Once | SUBCUTANEOUS | Status: AC
  Administered 2017-04-11: 10 [IU] via INTRAVENOUS
  Filled 2017-04-11: qty 1

## 2017-04-11 MED ORDER — FAMOTIDINE IN NACL 20-0.9 MG/50ML-% IV SOLN
20.0000 mg | INTRAVENOUS | Status: DC
Start: 2017-04-11 — End: 2017-04-12
  Administered 2017-04-11: 20 mg via INTRAVENOUS
  Filled 2017-04-11: qty 50

## 2017-04-11 MED ORDER — VITAL HIGH PROTEIN PO LIQD
1000.0000 mL | ORAL | Status: DC
  Administered 2017-04-11: 1000 mL
  Administered 2017-04-12: 08:00:00
  Administered 2017-04-12: 1000 mL
  Administered 2017-04-12 (×7)
  Administered 2017-04-14 – 2017-04-15 (×3): 1000 mL

## 2017-04-11 MED ORDER — DEXTROSE 50 % IV SOLN
INTRAVENOUS | Status: AC
  Administered 2017-04-11: 50 mL via INTRAVENOUS
  Filled 2017-04-11: qty 50

## 2017-04-11 MED ORDER — SODIUM POLYSTYRENE SULFONATE 15 GM/60ML PO SUSP
15.0000 g | Freq: Once | ORAL | Status: AC
  Administered 2017-04-11: 15 g via ORAL
  Filled 2017-04-11: qty 60

## 2017-04-11 MED ORDER — SODIUM CHLORIDE 0.9 % IV SOLN
0.0000 ug/min | INTRAVENOUS | Status: DC
  Administered 2017-04-11: 400 ug/min via INTRAVENOUS
  Administered 2017-04-11: 380 ug/min via INTRAVENOUS
  Administered 2017-04-11: 100 ug/min via INTRAVENOUS
  Administered 2017-04-11: 400 ug/min via INTRAVENOUS
  Administered 2017-04-12: 280 ug/min via INTRAVENOUS
  Administered 2017-04-12: 400 ug/min via INTRAVENOUS
  Administered 2017-04-12: 375 ug/min via INTRAVENOUS
  Administered 2017-04-12: 310 ug/min via INTRAVENOUS
  Administered 2017-04-12: 250 ug/min via INTRAVENOUS
  Administered 2017-04-12: 280 ug/min via INTRAVENOUS
  Administered 2017-04-12: 310 ug/min via INTRAVENOUS
  Administered 2017-04-12: 300 ug/min via INTRAVENOUS
  Administered 2017-04-12: 400 ug/min via INTRAVENOUS
  Administered 2017-04-13: 275 ug/min via INTRAVENOUS
  Administered 2017-04-13: 350 ug/min via INTRAVENOUS
  Administered 2017-04-13: 250 ug/min via INTRAVENOUS
  Administered 2017-04-13: 160 ug/min via INTRAVENOUS
  Administered 2017-04-15: 40 ug/min via INTRAVENOUS
  Administered 2017-04-16: 75 ug/min via INTRAVENOUS
  Filled 2017-04-11: qty 4
  Filled 2017-04-11: qty 40
  Filled 2017-04-11 (×5): qty 4
  Filled 2017-04-11: qty 40
  Filled 2017-04-11 (×14): qty 4
  Filled 2017-04-11: qty 40

## 2017-04-11 MED ORDER — PUREFLOW DIALYSIS SOLUTION
INTRAVENOUS | Status: DC
  Administered 2017-04-11 – 2017-04-12 (×2): via INTRAVENOUS_CENTRAL

## 2017-04-11 MED ORDER — PRO-STAT SUGAR FREE PO LIQD
30.0000 mL | Freq: Two times a day (BID) | ORAL | Status: DC
  Administered 2017-04-11 – 2017-04-16 (×11): 30 mL

## 2017-04-11 MED ORDER — AMIODARONE HCL 200 MG PO TABS
400.0000 mg | ORAL_TABLET | Freq: Two times a day (BID) | ORAL | Status: DC
  Administered 2017-04-11 – 2017-04-12 (×3): 400 mg via ORAL
  Filled 2017-04-11 (×3): qty 2

## 2017-04-11 MED ORDER — HEPARIN SODIUM (PORCINE) 1000 UNIT/ML IJ SOLN
1000.0000 [IU] | INTRAMUSCULAR | Status: DC | PRN
  Administered 2017-04-22: 1000 [IU]
  Filled 2017-04-11: qty 1
  Filled 2017-04-11: qty 6

## 2017-04-11 MED ORDER — VASOPRESSIN 20 UNIT/ML IV SOLN
0.0300 [IU]/min | INTRAVENOUS | Status: DC
  Administered 2017-04-11 – 2017-04-13 (×3): 0.03 [IU]/min via INTRAVENOUS
  Filled 2017-04-11 (×3): qty 2

## 2017-04-11 MED ORDER — VECURONIUM BROMIDE 10 MG IV SOLR
INTRAVENOUS | Status: AC
  Administered 2017-04-11: 10 mg via INTRAVENOUS
  Filled 2017-04-11: qty 10

## 2017-04-11 MED ORDER — TUBERCULIN PPD 5 UNIT/0.1ML ID SOLN
5.0000 [IU] | Freq: Once | INTRADERMAL | Status: AC
  Administered 2017-04-11: 5 [IU] via INTRADERMAL
  Filled 2017-04-11: qty 0.1

## 2017-04-11 MED ORDER — MIDAZOLAM HCL 2 MG/2ML IJ SOLN
2.0000 mg | Freq: Once | INTRAMUSCULAR | Status: AC
  Administered 2017-04-11: 2 mg via INTRAVENOUS

## 2017-04-11 MED ORDER — DEXTROSE 50 % IV SOLN
INTRAVENOUS | Status: AC
  Administered 2017-04-11: 50 mL
  Filled 2017-04-11: qty 50

## 2017-04-11 NOTE — Progress Notes (Signed)
Subjective: Patient alert and awake. Has no complaints.  Objective: Vital signs in last 24 hours: Temp:  [96.4 F (35.8 C)-98.3 F (36.8 C)] 98.3 F (36.8 C) (07/04 1128) Pulse Rate:  [29-150] 54 (07/04 1500) Resp:  [15-42] 42 (07/04 1500) BP: (67-142)/(54-130) 67/54 (07/04 1500) SpO2:  [89 %-100 %] 94 % (07/04 1500) Weight:  [82.7 kg (182 lb 5.1 oz)] 82.7 kg (182 lb 5.1 oz) (07/04 0405) Weight change: -3 kg (-6 lb 9.8 oz) Last BM Date: 04/11/17  Intake/Output from previous day: 07/03 0701 - 07/04 0700 In: 1708.8 [I.V.:748.8; IV Piggyback:960] Out: 9476 [Urine:135] Intake/Output this shift: Total I/O In: 78.2 [P.O.:20; I.V.:58.2] Out: 952 [Other:952]  General appearance: alert Resp: clear to auscultation bilaterally Cardio: irregularly irregular rhythm GI: soft, non-tender; bowel sounds normal; no masses,  no organomegaly Extremities: 2+ lower extremity edema. Multiple bullous lesions left greater than right  Lab Results:  Recent Labs  04/09/17 0003 04/09/17 0423 04/10/17 0534  WBC 12.9* 13.1*  --   HGB 13.7 12.9  --   HCT 41.6 39.0  --   PLT 115* 91* 49*   BMET  Recent Labs  04/11/17 0458 04/11/17 0917  NA 139 141  K 4.4 4.7  CL 105 105  CO2 25 23  GLUCOSE 128* 89  BUN 12 12  CREATININE 0.75 0.70  CALCIUM 8.9 9.4    Studies/Results: Dg Chest Port 1 View  Result Date: 04/10/2017 CLINICAL DATA:  Respiratory failure. EXAM: PORTABLE CHEST 1 VIEW COMPARISON:  04/09/2017 . FINDINGS: Cardiomegaly with pulmonary vascular congestion and mild interstitial edema again noted. Bibasilar atelectasis. Bibasilar pneumonia cannot be excluded. Small bilateral pleural effusions again noted. No pneumothorax . IMPRESSION: 1. Cardiomegaly with mild pulmonary venous congestion interstitial edema. Small bilateral pleural effusions. No change. 2. Low lung volumes with bibasilar atelectasis. Bibasilar pneumonia cannot be excluded. Chest is unchanged from prior exam.  Electronically Signed   By: Marcello Moores  Register   On: 04/10/2017 06:58    Medications: I have reviewed the patient's current medications.  Assessment/Plan: 1. Sepsis with septic shock. Likely secondary to lower extremity wound infections. also may be pulmonary source also has her strep urinary antigen is positive.   2. Lower extremity cellulitis. Patient is on Zosyn . Cultures unrevealing so far . 3. Acute renal failure. Patient likely has nephrotic syndrome. currently receiving dialysis  4. Hypokalemia. repleted  5. Lower extremity edema. Not sure what precipitated this.Perhaps nephrotic syndrome. Echocardiogram pending. Multiple serologies have been ordered to further assess.  6. Atrial fibrillation. Now on amiodarone for rate control.  7. Coagulopathy. Suspect this could be DIC. Continue supportive care and treatment of underlying calls and monitor coags. 8. Cardiomyopathy. Echocardiogram revealed EF of 25%. Etiology unclear. Currently having fluid removed by dialysis. Can start appropriate medications when blood pressure is more stable.  Total time spent 20 minutes   LOS: 3 days   Baxter Hire 04/11/2017, 3:33 PM

## 2017-04-11 NOTE — Procedures (Signed)
Endotracheal Intubation Procedure Note  Indication for endotracheal intubation: respiratory failure. Airway Assessment: Mallampati Class: II (hard and soft palate, upper portion of tonsils anduvula visible). Sedation: fentanyl and midazolam. Paralytic: vecuronium. Lidocaine: no. Atropine: no. Equipment: glidescope utilized for mechanical intubation. Cricoid Pressure: no. Number of attempts: 1. ETT location confirmed by by auscultation and ETCO2 monitor.  Pt mechanically intubated emergently by respiratory therapist.  [name], [place] Pager [phone] (please enter 7 digits) [place] Pager [phone] (please enter 7 digits)  Merton Border, MD P[facility] service Mobile [phone] Pager [phone] 04/12/2017 3:57 PM

## 2017-04-11 NOTE — Progress Notes (Signed)
[facility] INFECTIOUS DISEASE PROGRESS NOTE Date of Admission:  04/08/2017     ID: [name] is a 66 y.o. female with sepsis  Active Problems:   Septic shock ([place])   Subjective: Coded and now intubated and back on pressors. No fevers, was Hypothermic. Currently on phenylephrine, vasopressin and norepi. On CRRT.   ROS  Unable to obtain  Medications:  Antibiotics Given (last 72 hours)    Date/Time Action Medication Dose Rate   04/09/17 1902 New Bag/Given   vancomycin (VANCOCIN) IVPB 750 mg/150 ml premix 750 mg 150 mL/hr   04/10/17 1057 New Bag/Given   piperacillin-tazobactam (ZOSYN) IVPB 3.375 g 3.375 g 12.5 mL/hr   04/10/17 1742 New Bag/Given   piperacillin-tazobactam (ZOSYN) IVPB 3.375 g 3.375 g 12.5 mL/hr   04/11/17 0229 New Bag/Given   piperacillin-tazobactam (ZOSYN) IVPB 3.375 g 3.375 g 12.5 mL/hr   04/11/17 1029 New Bag/Given   piperacillin-tazobactam (ZOSYN) IVPB 3.375 g 3.375 g 12.5 mL/hr   04/11/17 2208 New Bag/Given   piperacillin-tazobactam (ZOSYN) IVPB 3.375 g 3.375 g 12.5 mL/hr   04/12/17 0600 New Bag/Given   piperacillin-tazobactam (ZOSYN) IVPB 3.375 g 3.375 g 12.5 mL/hr   04/12/17 1340 New Bag/Given   piperacillin-tazobactam (ZOSYN) IVPB 3.375 g 3.375 g 12.5 mL/hr     . chlorhexidine gluconate (MEDLINE KIT)  15 mL Mouth Rinse BID  . feeding supplement (PRO-STAT SUGAR FREE 64)  30 mL Per Tube BID  . feeding supplement (VITAL HIGH PROTEIN)  1,000 mL Per Tube Q24H  . ipratropium-albuterol  3 mL Nebulization Q6H  . levothyroxine  25 mcg Intravenous Daily  . mouth rinse  15 mL Mouth Rinse 10 times per day  . pantoprazole (PROTONIX) IV  40 mg Intravenous Q24H  . sertraline  25 mg Oral Daily  . silver sulfADIAZINE   Topical BID  . tuberculin  5 Units Intradermal Once    Objective: Vital signs in last 24 hours: Temp:  [93.9 F (34.4 C)-98.4 F (36.9 C)] 97.5 F (36.4 C) (07/05 1556) Pulse Rate:  [25-141] 67 (07/05 1556) Resp:  [14-36] 23 (07/05  1556) BP: (73-132)/(38-103) 99/70 (07/05 1545) SpO2:  [80 %-100 %] 99 % (07/05 1556) FiO2 (%):  [50 %-100 %] 50 % (07/05 1615) Weight:  [89.7 kg (197 lb 12 oz)] 89.7 kg (197 lb 12 oz) (07/05 0400) Constitutional:  intubated, ill appearing HENT: [place]/AT, PERRLA, no scleral icterus, Mouth/Throat: ETT Cardiovascular:Tachy, 2/6 sm Pulmonary/Chest: dec bs bil bases Neck = supple, no nuchal rigidity Abdominal: Soft. Bowel sounds are normal.  exhibits no distension. There is no tenderness. Rectal tube Lymphadenopathy: no cervical adenopathy. No axillary adenopathy Neurological: intubated and sedated HD cath RLE  Ext 3+ edema bil le Skin: bruising on L inner thigh, Bil LE wrapped but Dr Alva Garnet and RN describe ruptured bullae Psychiatric: unable to obtain   Lab Results  Recent Labs  04/11/17 2145  04/12/17 0450  04/12/17 1217 04/12/17 1554  WBC 13.0*  --  12.7*  --   --   --   HGB 7.6*  --  7.1*  --   --   --   HCT 23.2*  --  22.3*  --   --   --   NA 143  142  < > 140  < > 143 142  K 4.2  4.2  < > 4.0  < > 3.6 4.0  CL 108  108  < > 106  < > 107 106  CO2 20*  21*  < >  21*  < > 20* 21*  BUN 16  16  < > 14  < > 14 13  CREATININE 0.96  0.96  < > 0.65  < > 0.78 0.76  < > = values in this interval not displayed.  Microbiology: Results for orders placed or performed during the hospital encounter of 04/08/17  Culture, blood (routine x 2)     Status: None (Preliminary result)   Collection Time: 04/08/17  2:04 AM  Result Value Ref Range Status   Specimen Description BLOOD RIGHT ANTECUBITAL  Final   Special Requests   Final    BOTTLES DRAWN AEROBIC AND ANAEROBIC Blood Culture adequate volume   Culture NO GROWTH 4 DAYS  Final   Report Status PENDING  Incomplete  Culture, blood (routine x 2)     Status: None (Preliminary result)   Collection Time: 04/08/17  2:06 AM  Result Value Ref Range Status   Specimen Description BLOOD LEFT ANTECUBITAL  Final   Special Requests   Final     BOTTLES DRAWN AEROBIC AND ANAEROBIC Blood Culture adequate volume   Culture NO GROWTH 4 DAYS  Final   Report Status PENDING  Incomplete  C difficile quick scan w PCR reflex     Status: None   Collection Time: 04/08/17  5:09 AM  Result Value Ref Range Status   C Diff antigen NEGATIVE NEGATIVE Final   C Diff toxin NEGATIVE NEGATIVE Final   C Diff interpretation No C. difficile detected.  Final  MRSA PCR Screening     Status: None   Collection Time: 04/08/17  6:50 AM  Result Value Ref Range Status   MRSA by PCR NEGATIVE NEGATIVE Final    Comment:        The GeneXpert MRSA Assay (FDA approved for NASAL specimens only), is one component of a comprehensive MRSA colonization surveillance program. It is not intended to diagnose MRSA infection nor to guide or monitor treatment for MRSA infections.   Urine culture     Status: None   Collection Time: 04/08/17 12:16 PM  Result Value Ref Range Status   Specimen Description URINE, RANDOM  Final   Special Requests NONE  Final   Culture   Final    NO GROWTH Performed at [facility], [address] [place], [place] [address]    Report Status 04/10/2017 FINAL  Final  Aerobic Culture (superficial specimen)     Status: None   Collection Time: 04/09/17 11:32 AM  Result Value Ref Range Status   Specimen Description SKIN  Final   Special Requests NONE  Final   Gram Stain   Final    RARE WBC PRESENT, PREDOMINANTLY PMN NO SQUAMOUS EPITHELIAL CELLS SEEN NO ORGANISMS SEEN    Culture   Final    NO GROWTH 2 DAYS Performed at [facility], [place] [address] [place], [place] [address]    Report Status 04/11/2017 FINAL  Final  Gastrointestinal Panel by PCR , Stool     Status: None   Collection Time: 04/09/17  5:10 PM  Result Value Ref Range Status   Campylobacter species NOT DETECTED NOT DETECTED Final   Plesimonas shigelloides NOT DETECTED NOT DETECTED Final   Salmonella species NOT DETECTED NOT DETECTED Final   Yersinia  enterocolitica NOT DETECTED NOT DETECTED Final   Vibrio species NOT DETECTED NOT DETECTED Final   Vibrio cholerae NOT DETECTED NOT DETECTED Final   Enteroaggregative E coli (EAEC) NOT DETECTED NOT DETECTED Final  Enteropathogenic E coli (EPEC) NOT DETECTED NOT DETECTED Final   Enterotoxigenic E coli (ETEC) NOT DETECTED NOT DETECTED Final   Shiga like toxin producing E coli (STEC) NOT DETECTED NOT DETECTED Final   Shigella/Enteroinvasive E coli (EIEC) NOT DETECTED NOT DETECTED Final   Cryptosporidium NOT DETECTED NOT DETECTED Final   Cyclospora cayetanensis NOT DETECTED NOT DETECTED Final   Entamoeba histolytica NOT DETECTED NOT DETECTED Final   Giardia lamblia NOT DETECTED NOT DETECTED Final   Adenovirus F40/41 NOT DETECTED NOT DETECTED Final   Astrovirus NOT DETECTED NOT DETECTED Final   Norovirus GI/GII NOT DETECTED NOT DETECTED Final   Rotavirus A NOT DETECTED NOT DETECTED Final   Sapovirus (I, II, IV, and V) NOT DETECTED NOT DETECTED Final  Culture, respiratory (NON-Expectorated)     Status: None (Preliminary result)   Collection Time: 04/12/17 12:00 PM  Result Value Ref Range Status   Specimen Description TRACHEAL ASPIRATE  Final   Special Requests NONE  Final   Gram Stain   Final    MODERATE WBC PRESENT,BOTH PMN AND MONONUCLEAR MODERATE YEAST Performed at [facility], [address] [place], [place] [address]    Culture PENDING  Incomplete   Report Status PENDING  Incomplete    Studies/Results: Dg Chest Port 1 View  Result Date: 04/12/2017 CLINICAL DATA:  Respiratory failure. EXAM: PORTABLE CHEST 1 VIEW COMPARISON:  04/11/2017. FINDINGS: Endotracheal tube, left IJ line, NG tube in stable position. Cardiomegaly with prominent diffuse bilateral pulmonary infiltrates/ edema again noted without interim change. Small right pleural effusion again noted. No pneumothorax. IMPRESSION: 1.  Lines and tubes stable position. 2. Cardiomegaly with prominent diffuse bilateral  pulmonary infiltrates/ edema and small right pleural effusion. No significant interim change. Electronically Signed   By: Marcello Moores  Register   On: 04/12/2017 06:30   Dg Chest Port 1 View  Result Date: 04/11/2017 CLINICAL DATA:  Post intubation and NG tube placement. EXAM: PORTABLE CHEST 1 VIEW COMPARISON:  04/10/2017 FINDINGS: 1733 hours. Endotracheal tube tip is 3.2 cm above the base of the carina. Left IJ central line tip overlies the distal SVC region. The NG tube passes into the stomach although the distal tip position is not included on the film. The cardio pericardial silhouette is enlarged. Interval progression of diffuse interstitial and bilateral airspace disease. Persistent small right pleural effusion. Defibrillator pads overlie the chest. IMPRESSION: 1. Support apparatus appears appropriately positioned. 2. Bilateral interstitial and airspace disease compatible with diffuse pneumonia or asymmetric pulmonary edema. 3. Small right pleural effusion. Electronically Signed   By: Misty Stanley M.D.   On: 04/11/2017 17:44   Dg Abd Portable 1v  Result Date: 04/11/2017 CLINICAL DATA:  Intubation. EXAM: PORTABLE ABDOMEN - 1 VIEW COMPARISON:  CT from 04/04/2017 FINDINGS: Nasogastric tube with tip in the low stomach. The stomach is moderately gas distended. Rectal tube is also present. Right femoral catheter with tip at the L2-3 disc level. Amorphous density over the right iliac fossa is likely enteric. No calcifications seen in this area on recent CT. Questionable fold thickening of pelvic bowel loops. Bowel gas pattern is no overall nonobstructive. IMPRESSION: 1. Nasogastric tube tip is at the distal stomach. 2. Moderate gaseous distention of the stomach. 3. Right femoral venous catheter with tip at the L2-3 disc level. Electronically Signed   By: Monte Fantasia M.D.   On: 04/11/2017 18:07   Study Conclusions  - Left ventricle: The cavity size was mildly dilated. There was   mild concentric hypertrophy.  Systolic function was severely   reduced. The estimated ejection fraction was in the range of 25%   to 30%. - Aortic valve: There was moderate regurgitation. - Mitral valve: There was mild regurgitation. - Left atrium: The atrium was mildly dilated. - Right atrium: The atrium was mildly dilated. - Tricuspid valve: There was moderate regurgitation.  Assessment/Plan: [name] is a 66 y.o. female admitted with 1-2 months of progressive LE edema, progressive to bullae and cellulitis, hypotension as well as bil pleural effusions. Labs are significant for coagulopathy, TCP,  elevated BNP, acute renal failure, low albumin, elevated lactate, proteinuria, marked elevated procalcitonin.  WBC only mildly elevated, no fevers. [place] neg.   Her strep PNA urinary ag is also + but bcx pending. CT scan done a few days prior to admit shows effusion, possible R consolidation, gallstones and subq edema on Abd wall.   She certainly has LE cellulitis  but this is related to the edema and not the primary underlying etiology. Gram stain is neg on fluid from bullae. Also likely PNA based on CXR and urinary ag + S pna.  Echo shows severe systolic CHF, Mod AR, Mod TR.   She has not unusual exposures, no tick exposures. Endocarditis is a possibility but echo and bcx neg so far.  BCX ucx neg. HIV neg, ESR 21. C diff and stool PCR neg.  She has coded and is now intubated and on 3 pressors. No obvious etiology of the renal failure and CHF.   LFTS very high from shock liver.  HGB dropped impressively. Stool heme +.  Procalcitonin decreasing.  She is very ill and has poor prognosis.   Recommendations continue zosyn for now pending bcx and wound cx. Continue renal wu and volume control. Continue wu for Underlying etiology.  CBC smear pending- consider consult heme onc - wu for hemolytic anemia.  Thank you very much for the consult. Will follow with you.  FITZGERALD, DAVID P   04/12/2017, 4:57 PM

## 2017-04-11 NOTE — Progress Notes (Signed)
CRRT ended per Dr. Assunta Gambles orders. Rinseback performed without difficulty. Access heparin locked per orders.

## 2017-04-11 NOTE — Procedures (Signed)
Central Venous Catheter Insertion Procedure Note Alexyss Balzarini [id number] November 14, 1950  Procedure: Insertion of Central Venous Catheter Indications: Assessment of intravascular volume, Drug and/or fluid administration and Frequent blood sampling  Procedure Details Consent: Emergent Post Code Blue Time Out: Verified patient identification, verified procedure, site/side was marked, verified correct patient position, special equipment/implants available, medications/allergies/relevent history reviewed, required imaging and test results available.  Performed  Maximum sterile technique was used including antiseptics, cap, gloves, gown, hand hygiene, mask and sheet. Skin prep: Chlorhexidine; local anesthetic administered A antimicrobial bonded/coated triple lumen catheter was placed in the left internal jugular vein using the Seldinger technique.  Evaluation Blood flow good Complications: No apparent complications Patient did tolerate procedure well. Chest X-ray ordered to verify placement.  CXR: pending.  Left internal central line placed emergently utilizing ultrasound no complications noted during or following procedure cxr pending.  [name], [place] Pager [phone] (please enter 7 digits) [place] Pager [phone] (please enter 7 digits)  Merton Border, MD PCCM service Mobile [phone] Pager [phone] 04/12/2017 3:57 PM

## 2017-04-11 NOTE — Progress Notes (Signed)
RN had been bathing patient with NTs. Patient was alert and oriented during bath and answering questions with staff. RN had been titrating up vasopressor during bath due to low BP. Patient rolled to left side to roll through sheets and wash back and when patient informed about rolling back to center, RN noticed that patient was not responding. Initially had pulse but pulse lost as patient became bradycardiac and CPR started. See code blue report for full report of medications given, but patient given 2 amps of d50 for CBG 21, neosynephrine increased to 400 mcg/min during code and levophed started but titrated off after code. [name] NP placed central line post code. Post code [name] NP gave verbal order to change phenylephrine to quad strength. [name] NP spoke to husband on the phone and then in person about situation.

## 2017-04-11 NOTE — Plan of Care (Signed)
Problem: Skin Integrity: Goal: Risk for impaired skin integrity will decrease Outcome: Not Progressing Pt has blisters on legs and a small patch on her right buttock. Wound care done.

## 2017-04-11 NOTE — Progress Notes (Signed)
[facility]  ROUNDING NOTE   Subjective:   Husband at bedside.  CRRT - 2424  Off vasopressors. Still in a-fib with RVR  Objective:  Vital signs in last 24 hours:  Temp:  [96.4 F (35.8 C)-97.9 F (36.6 C)] 96.4 F (35.8 C) (07/04 0725) Pulse Rate:  [29-150] 70 (07/04 0800) Resp:  [15-30] 30 (07/04 0800) BP: (82-142)/(53-130) 109/67 (07/04 0800) SpO2:  [91 %-100 %] 95 % (07/04 0800) Weight:  [82.7 kg (182 lb 5.1 oz)] 82.7 kg (182 lb 5.1 oz) (07/04 0405)  Weight change: -3 kg (-6 lb 9.8 oz) Filed Weights   04/09/17 2000 04/10/17 0500 04/11/17 0405  Weight: 85.7 kg (188 lb 15 oz) 86 kg (189 lb 9.5 oz) 82.7 kg (182 lb 5.1 oz)    Intake/Output: I/O last 3 completed shifts: In: 3200.9 [I.V.:2040.9; IV Piggyback:1160] Out: 1950 [Urine:260; DTOIZ:1245; Stool:100]   Intake/Output this shift:  Total I/O In: 16.7 [I.V.:16.7] Out: 194 [Other:194]  Physical Exam: General: NAD, sitting up in bed  Head: Normocephalic, atraumatic. Moist oral mucosal membranes  Eyes: Anicteric, PERRL  Neck: Supple, trachea midline  Lungs:  Bilateral crackles  Heart: irregular  Abdomen:  Soft, nontender, obese  Extremities: 1+ weeping bullae lower extremity edema. Bilateral dressings - clean and dry.   Neurologic: Nonfocal, moving all four extremities  Skin: Bullous lesions with serous anginous   Access: Right femoral temp HD catheter Dr. Lucky Cowboy 7/2    Basic Metabolic Panel:  Recent Labs Lab 04/10/17 0932 04/10/17 1713 04/10/17 2054 04/11/17 0053 04/11/17 0458  NA 141 139 142 140 139  K 3.5 3.6 3.8 4.1 4.4  CL 107 106 106 105 105  CO2 24 26 26 25 25   GLUCOSE 126* 112* 99 113* 128*  BUN 21* 16 15 14 12   CREATININE 1.17* 0.79 0.88 0.79 0.75  CALCIUM 8.4* 8.6* 9.1 9.1 8.9  MG 1.8 1.7 1.8 1.8 1.8  PHOS 1.7* 1.5* 1.4* 2.0* 3.0    Liver Function Tests:  Recent Labs Lab 04/08/17 0139  04/10/17 0138  04/10/17 0932 04/10/17 1713 04/10/17 2054 04/11/17 0053  04/11/17 0458  AST 46*  --  26  --   --   --   --   --   --   ALT 44  --  24  --   --   --   --   --   --   ALKPHOS 166*  --  107  --   --   --   --   --   --   BILITOT 1.8*  --  1.1  --   --   --   --   --   --   PROT 5.5*  --  4.3*  --   --   --   --   --   --   ALBUMIN 2.8*  < > 2.0*  2.2*  < > 3.3* 3.5 4.1 4.3 4.0  < > = values in this interval not displayed. No results for input(s): LIPASE, AMYLASE in the last 168 hours. No results for input(s): AMMONIA in the last 168 hours.  CBC:  Recent Labs Lab 04/08/17 0139 04/09/17 0003 04/09/17 0423 04/10/17 0534  WBC 13.2* 12.9* 13.1*  --   NEUTROABS 12.0*  --   --   --   HGB 15.0 13.7 12.9  --   HCT 45.2 41.6 39.0  --   MCV 95.0 96.5 94.9  --   PLT 142* 115* 91* 49*  Cardiac Enzymes:  Recent Labs Lab 04/08/17 0139  TROPONINI 0.05*    BNP: Invalid input(s): POCBNP  CBG:  Recent Labs Lab 04/08/17 0637 04/08/17 0719  GLUCAP 65 90    Microbiology: Results for orders placed or performed during the hospital encounter of 04/08/17  Culture, blood (routine x 2)     Status: None (Preliminary result)   Collection Time: 04/08/17  2:04 AM  Result Value Ref Range Status   Specimen Description BLOOD RIGHT ANTECUBITAL  Final   Special Requests   Final    BOTTLES DRAWN AEROBIC AND ANAEROBIC Blood Culture adequate volume   Culture NO GROWTH 3 DAYS  Final   Report Status PENDING  Incomplete  Culture, blood (routine x 2)     Status: None (Preliminary result)   Collection Time: 04/08/17  2:06 AM  Result Value Ref Range Status   Specimen Description BLOOD LEFT ANTECUBITAL  Final   Special Requests   Final    BOTTLES DRAWN AEROBIC AND ANAEROBIC Blood Culture adequate volume   Culture NO GROWTH 3 DAYS  Final   Report Status PENDING  Incomplete  C difficile quick scan w PCR reflex     Status: None   Collection Time: 04/08/17  5:09 AM  Result Value Ref Range Status   C Diff antigen NEGATIVE NEGATIVE Final   C Diff toxin  NEGATIVE NEGATIVE Final   C Diff interpretation No C. difficile detected.  Final  MRSA PCR Screening     Status: None   Collection Time: 04/08/17  6:50 AM  Result Value Ref Range Status   MRSA by PCR NEGATIVE NEGATIVE Final    Comment:        The GeneXpert MRSA Assay (FDA approved for NASAL specimens only), is one component of a comprehensive MRSA colonization surveillance program. It is not intended to diagnose MRSA infection nor to guide or monitor treatment for MRSA infections.   Urine culture     Status: None   Collection Time: 04/08/17 12:16 PM  Result Value Ref Range Status   Specimen Description URINE, RANDOM  Final   Special Requests NONE  Final   Culture   Final    NO GROWTH Performed at [facility], [address] [place], [place] [address]    Report Status 04/10/2017 FINAL  Final  Aerobic Culture (superficial specimen)     Status: None (Preliminary result)   Collection Time: 04/09/17 11:32 AM  Result Value Ref Range Status   Specimen Description SKIN  Final   Special Requests NONE  Final   Gram Stain   Final    RARE WBC PRESENT, PREDOMINANTLY PMN NO SQUAMOUS EPITHELIAL CELLS SEEN NO ORGANISMS SEEN    Culture   Final    NO GROWTH 1 DAY Performed at [facility], [place] [address] [place], [place] [address]    Report Status PENDING  Incomplete  Gastrointestinal Panel by PCR , Stool     Status: None   Collection Time: 04/09/17  5:10 PM  Result Value Ref Range Status   Campylobacter species NOT DETECTED NOT DETECTED Final   Plesimonas shigelloides NOT DETECTED NOT DETECTED Final   Salmonella species NOT DETECTED NOT DETECTED Final   Yersinia enterocolitica NOT DETECTED NOT DETECTED Final   Vibrio species NOT DETECTED NOT DETECTED Final   Vibrio cholerae NOT DETECTED NOT DETECTED Final   Enteroaggregative E coli (EAEC) NOT DETECTED NOT DETECTED Final   Enteropathogenic E coli (EPEC) NOT DETECTED NOT DETECTED Final  Enterotoxigenic E coli (ETEC)  NOT DETECTED NOT DETECTED Final   Shiga like toxin producing E coli (STEC) NOT DETECTED NOT DETECTED Final   Shigella/Enteroinvasive E coli (EIEC) NOT DETECTED NOT DETECTED Final   Cryptosporidium NOT DETECTED NOT DETECTED Final   Cyclospora cayetanensis NOT DETECTED NOT DETECTED Final   Entamoeba histolytica NOT DETECTED NOT DETECTED Final   Giardia lamblia NOT DETECTED NOT DETECTED Final   Adenovirus F40/41 NOT DETECTED NOT DETECTED Final   Astrovirus NOT DETECTED NOT DETECTED Final   Norovirus GI/GII NOT DETECTED NOT DETECTED Final   Rotavirus A NOT DETECTED NOT DETECTED Final   Sapovirus (I, II, IV, and V) NOT DETECTED NOT DETECTED Final    Coagulation Studies:  Recent Labs  04/08/17 1217 04/09/17 0003 04/10/17 0534  LABPROT 58.1* 52.9* 28.8*  INR 6.37* 5.67* 2.65    Urinalysis:  Recent Labs  04/08/17 1218  COLORURINE AMBER*  LABSPEC 1.024  PHURINE 5.0  GLUCOSEU NEGATIVE  HGBUR MODERATE*  BILIRUBINUR NEGATIVE  KETONESUR NEGATIVE  PROTEINUR 100*  NITRITE NEGATIVE  LEUKOCYTESUR NEGATIVE      Imaging: Dg Chest Port 1 View  Result Date: 04/10/2017 CLINICAL DATA:  Respiratory failure. EXAM: PORTABLE CHEST 1 VIEW COMPARISON:  04/09/2017 . FINDINGS: Cardiomegaly with pulmonary vascular congestion and mild interstitial edema again noted. Bibasilar atelectasis. Bibasilar pneumonia cannot be excluded. Small bilateral pleural effusions again noted. No pneumothorax . IMPRESSION: 1. Cardiomegaly with mild pulmonary venous congestion interstitial edema. Small bilateral pleural effusions. No change. 2. Low lung volumes with bibasilar atelectasis. Bibasilar pneumonia cannot be excluded. Chest is unchanged from prior exam. Electronically Signed   By: Marcello Moores  Register   On: 04/10/2017 06:58     Medications:   . amiodarone 30 mg/hr (04/11/17 0700)  . piperacillin-tazobactam (ZOSYN)  IV Stopped (04/11/17 0629)  . pureflow 2,000 mL/hr at 04/11/17 0406   . feeding supplement  (ENSURE ENLIVE)  237 mL Oral TID  . levothyroxine  25 mcg Intravenous Daily  . silver sulfADIAZINE   Topical BID   heparin, metoprolol tartrate, [DISCONTINUED] ondansetron **OR** ondansetron (ZOFRAN) IV, zinc oxide  Assessment/ Plan:  [name] is a 66 y.o. white female  with hypertension, hypothyroidism, atrial fibrillation, who was admitted to [facility] on 04/08/2017 for evaluation of increasing lower extremity edema.  1. Acute renal failure with proteinuria and hematuria: baseline creatinine of 0.62 09/2014.  Progressive peripheral edema and hypoalbuminemia. Concerning for nephrotic syndrome. Recent IV contrast exposure on 04/04/17.  - May need renal biopsy when more stable.  - Serologic work up: compliments normal, negative SPEP/UPEP, ANA negative, ANCA negative, GBM negative, negative hepatitis - Will transition from CVVHD to intermittent hemodialysis.  - Discontinue IV albumin  - Discussed case with husband  2. Hypotension with atrial fibrillation with rapid ventricular response: now off vasopressors: septic versus cardiogenic shock. Echocardiogram with EF of 25-30% - Broad spectrum antibiotics: pip/tazo. Appreciate ID input.   - amiodarone gtt  3. Thrombocytopenia: down to 49.   4. 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 Hemoglobin A1c 6.2% - continue glucose control.     LOS: [place], [place] 7/4/20188:46 AM

## 2017-04-11 NOTE — Progress Notes (Signed)
   04/11/17 1627  Clinical Encounter Type  Visited With Patient;Patient not available;Health care provider  Visit Type Code  Referral From Other (Comment);Nurse (intercom)   [place] responded to a code blue page. [place] arrived and staff was working on pt.

## 2017-04-11 NOTE — Significant Event (Signed)
Code Blue initiated pt became bradycardic than asystole, therefore ACLS protocol initiated pt received 2 mg epi, 2 amps sodium bicarb, and 2 amps of D50W due to cbg reading of 21 with ROSC 2 minutes following interventions.  Pt emergently intubated by respiratory therapist.  Left internal jugular central line inserted emergently.  Pt is not a candidate for hypothermic protocol due to thrombocytopenia and bleeding risk and pt currently requiring 2 vasopressors to maintain map goal >65. Pts current blood sugar 215.  Notified pts husband via telephone regarding change in pts condition he is currently en route to the hospital will update him further when he arrives at bedside. Plan of care discussed thoroughly with on call intensivist Dr. Alva Garnet he is in agreement with plan of care.  [name], [place] Pager [phone] (please enter 7 digits) PCCM Consult Pager [phone] (please enter 7 digits)

## 2017-04-11 NOTE — Consult Note (Signed)
[facility]  CARDIOLOGY CONSULT NOTE  Patient ID: [name] MRN: [id number] DOB/AGE: May 06, 1951 66 y.o.  Admit date: 04/08/2017 Referring Physician Edwina Barth Primary Physician [facility] Primary Cardiologist Nehemiah Massed Reason for Consultation Cardiomyopathy  HPI: 66 year old female referred for evaluation of cardiomyopathy. The patient has a history of essential hypertension and atrial fibrillation. She presents with 2 to three-week history of progressive lower extremity edema, left lower extremity cellulitis, and sepsis with hypotension. The patient developed acute on chronic renal failure with decreased urine output is undergoing CRRT. The patient was seen by Dr. Ola Spurr for apparent lower extremity cellulitis, and is treated with Zosyn, with blood and urine cultures negative. The patient is in atrial fibrillation, with echo rate of 100-120 bpm, asymptomatic, on amiodarone 400 mg twice a day. The patient denies chest pain or shortness of breath. 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  Review of systems complete and found to be negative unless listed above     Past Medical History:  Diagnosis Date  . A-fib ([place])    on pradaxa  . Hypertension   . Thyroid disease     Past Surgical History:  Procedure Laterality Date  . WRIST ARTHROSCOPY      Prescriptions Prior to Admission  Medication Sig Dispense Refill Last Dose  . ALPRAZolam (XANAX) 0.25 MG tablet Take 0.25 mg by mouth at bedtime as needed for anxiety.   unknown at unknown  . dabigatran (PRADAXA) 150 MG CAPS capsule Take 150 mg by mouth 2 (two) times daily.   unknown at unknown  . diltiazem (TIAZAC) 180 MG 24 hr capsule Take 180 mg by mouth 2 (two) times daily.   unknown at unknown  . furosemide (LASIX) 20 MG tablet Take 20 mg by mouth.   unknown at unknown  . levothyroxine (SYNTHROID, LEVOTHROID) 50 MCG tablet Take 50 mcg by mouth daily before breakfast.   unknown at  unknown  . metoprolol tartrate (LOPRESSOR) 50 MG tablet Take 50 mg by mouth 2 (two) times daily.   unknown  . sertraline (ZOLOFT) 25 MG tablet Take 25 mg by mouth daily.   unknown at unknown   Social History   Social History  . Marital status: Married    Spouse name: N/A  . Number of children: N/A  . Years of education: N/A   Occupational History  . Not on file.   Social History Main Topics  . Smoking status: Never Smoker  . Smokeless tobacco: Never Used  . Alcohol use No  . Drug use: Unknown  . Sexual activity: Not on file   Other Topics Concern  . Not on file   Social History Narrative  . No narrative on file    Family History  Problem Relation Age of Onset  . AAA (abdominal aortic aneurysm) Mother       Review of systems complete and found to be negative unless listed above      PHYSICAL EXAM  General: Well developed, well nourished, in no acute distress HEENT:  Normocephalic and atramatic Neck:  No JVD.  Lungs: Clear bilaterally to auscultation and percussion. Heart: HRRR . Normal S1 and S2 without gallops or murmurs.  Abdomen: Bowel sounds are positive, abdomen soft and non-tender  Msk:  Back normal, normal gait. Normal strength and tone for age. Extremities: No clubbing, cyanosis or edema.   Neuro: Alert and oriented X 3. Psych:  Good affect, responds appropriately  Labs:   Lab Results  Component Value  Date   WBC 13.1 (H) 04/09/2017   HGB 12.9 04/09/2017   HCT 39.0 04/09/2017   MCV 94.9 04/09/2017   PLT 49 (L) 04/10/2017    Recent Labs Lab 04/10/17 0138  04/11/17 0917  NA 143  144  < > 141  K 3.1*  3.1*  < > 4.7  CL 113*  115*  < > 105  CO2 22  23  < > 23  BUN 29*  28*  < > 12  CREATININE 1.50*  1.54*  < > 0.70  CALCIUM 7.4*  7.3*  < > 9.4  PROT 4.3*  --   --   BILITOT 1.1  --   --   ALKPHOS 107  --   --   ALT 24  --   --   AST 26  --   --   GLUCOSE 167*  165*  < > 89  < > = values in this interval not displayed. Lab  Results  Component Value Date   CKTOTAL 85 09/18/2014   CKMB 1.7 09/18/2014   TROPONINI 0.05 (HH) 04/08/2017   No results found for: CHOL No results found for: HDL No results found for: LDLCALC No results found for: TRIG No results found for: CHOLHDL No results found for: LDLDIRECT    Radiology: [place] Chest  Result Date: 04/08/2017 CLINICAL DATA:  RIGHT pleural effusion EXAM: CHEST ULTRASOUND COMPARISON:  Chest radiograph 04/08/2017 FINDINGS: Small to moderate RIGHT pleural effusion identified. Patient imaged in LEFT lateral decubitus position, unable to sit. No loculations or septations identified. IMPRESSION: Small to moderate RIGHT pleural effusion. Electronically Signed   By: Lavonia Dana M.D.   On: 04/08/2017 11:20   Ct Abdomen Pelvis W Contrast  Result Date: 04/04/2017 CLINICAL DATA:  Abdominal soft tissue swelling with additional BILATERAL leg and feet swelling, blistering, history hypertension, abnormal LFTs, no longer on thyroid medication EXAM: CT ABDOMEN AND PELVIS WITH CONTRAST TECHNIQUE: Multidetector CT imaging of the abdomen and pelvis was performed using the standard protocol following bolus administration of intravenous contrast. Sagittal and coronal MPR images reconstructed from axial data set. CONTRAST:  150mL ISOVUE-300 IOPAMIDOL (ISOVUE-300) INJECTION 61% IV. Dilute oral contrast. COMPARISON:  None FINDINGS: Lower chest: Small moderate RIGHT pleural effusion with some atelectasis and question infiltrate in RIGHT lower lobe. Compressive atelectasis at posterior RIGHT middle lobe. Mild subsegmental atelectasis LEFT lung base. Hepatobiliary: Calcified gallstone in gallbladder 8 mm diameter. No gross gallbladder wall thickening or biliary dilatation. Mild focal fatty infiltration of liver adjacent to falciform fissure. Liver otherwise unremarkable. Pancreas: Normal appearance Spleen: Normal appearance Adrenals/Urinary Tract: Adrenal glands, kidneys, ureters, and bladder normal  appearance. Stomach/Bowel: Appendix not identified. No pericecal inflammatory process. Stomach and bowel loops normal appearance. Vascular/Lymphatic: Atherosclerotic calcification aorta without aneurysm. No adenopathy. Reproductive: Uterus and adnexa unremarkable Other: No free air or free fluid. Scattered subcutaneous edema at the anterior abdominal wall of the mid to lower abdomen extending into the upper thighs greater on RIGHT. No hernia. Musculoskeletal: Mild osseous demineralization. Degenerative disc disease changes lumbar spine greatest at L3-L4. IMPRESSION: Moderate RIGHT pleural effusion with atelectasis of RIGHT lung base, cannot exclude consolidation in RIGHT lower lobe. Cholelithiasis without definite evidence of cholecystitis though if this is a clinical concern, consider ultrasound. Scattered subcutaneous edema at the anterior abdominal wall extending into the upper thighs greater on RIGHT, nonspecific. Aortic Atherosclerosis (ICD10-I70.0). Electronically Signed   By: Lavonia Dana M.D.   On: 04/04/2017 11:34   US Renal  Result Date: 04/08/2017  CLINICAL DATA:  Acute renal failure EXAM: RENAL / URINARY TRACT ULTRASOUND COMPLETE COMPARISON:  CT 04/04/2017 FINDINGS: Right Kidney: Length: 11.1 cm. Echogenicity within normal limits. No mass or hydronephrosis visualized. Left Kidney: Length: 10.9 cm. 1.9 cm cyst off the lower pole. Echogenicity within normal limits. No mass or hydronephrosis visualized. Bladder: Foley catheter in place, decompressed IMPRESSION: No acute findings. Electronically Signed   By: Rolm Baptise M.D.   On: 04/08/2017 16:18   US Venous Img Lower Bilateral  Result Date: 04/08/2017 CLINICAL DATA:  Bilateral lower extremity swelling for 2 weeks EXAM: BILATERAL LOWER EXTREMITY VENOUS DOPPLER ULTRASOUND TECHNIQUE: Gray-scale sonography with graded compression, as well as color Doppler and duplex ultrasound were performed to evaluate the lower extremity deep venous systems from the  level of the common femoral vein and including the common femoral, femoral, profunda femoral, popliteal and calf veins including the posterior tibial, peroneal and gastrocnemius veins when visible. The superficial great saphenous vein was also interrogated. Spectral Doppler was utilized to evaluate flow at rest and with distal augmentation maneuvers in the common femoral, femoral and popliteal veins. COMPARISON:  None. FINDINGS: RIGHT LOWER EXTREMITY Common Femoral Vein: No evidence of thrombus. Normal compressibility, respiratory phasicity and response to augmentation. Saphenofemoral Junction: No evidence of thrombus. Normal compressibility and flow on color Doppler imaging. Profunda Femoral Vein: No evidence of thrombus. Normal compressibility and flow on color Doppler imaging. Femoral Vein: No evidence of thrombus. Normal compressibility, respiratory phasicity and response to augmentation. Popliteal Vein: No evidence of thrombus. Normal compressibility, respiratory phasicity and response to augmentation. Calf Veins: Limited evaluation due to edema. Superficial Great Saphenous Vein: No evidence of thrombus. Normal compressibility and flow on color Doppler imaging. Venous Reflux:  None. Other Findings:  None. LEFT LOWER EXTREMITY Common Femoral Vein: No evidence of thrombus. Normal compressibility, respiratory phasicity and response to augmentation. Saphenofemoral Junction: No evidence of thrombus. Normal compressibility and flow on color Doppler imaging. Profunda Femoral Vein: No evidence of thrombus. Normal compressibility and flow on color Doppler imaging. Femoral Vein: No evidence of thrombus. Normal compressibility, respiratory phasicity and response to augmentation. Popliteal Vein: No evidence of thrombus. Normal compressibility, respiratory phasicity and response to augmentation. Calf Veins: Limited evaluation due to edema. Superficial Great Saphenous Vein: No evidence of thrombus. Normal compressibility and  flow on color Doppler imaging. Venous Reflux:  None. Other Findings:  None. IMPRESSION: No evidence of DVT within either lower extremity. Mild study limitations in the calf bilaterally due to edema. Electronically Signed   By: Andreas Newport M.D.   On: 04/08/2017 06:22   US Venous Img Lower Bilateral  Result Date: 03/23/2017 CLINICAL DATA:  Bilateral lower extremity edema. EXAM: BILATERAL LOWER EXTREMITY VENOUS DOPPLER ULTRASOUND TECHNIQUE: Gray-scale sonography with graded compression, as well as color Doppler and duplex ultrasound were performed to evaluate the lower extremity deep venous systems from the level of the common femoral vein and including the common femoral, femoral, profunda femoral, popliteal and calf veins including the posterior tibial, peroneal and gastrocnemius veins when visible. The superficial great saphenous vein was also interrogated. Spectral Doppler was utilized to evaluate flow at rest and with distal augmentation maneuvers in the common femoral, femoral and popliteal veins. COMPARISON:  None. FINDINGS: RIGHT LOWER EXTREMITY Common Femoral Vein: No evidence of thrombus. Normal compressibility, respiratory phasicity and response to augmentation. Saphenofemoral Junction: No evidence of thrombus. Normal compressibility and flow on color Doppler imaging. Profunda Femoral Vein: No evidence of thrombus. Normal compressibility and flow on color Doppler imaging.  Femoral Vein: No evidence of thrombus. Normal compressibility, respiratory phasicity and response to augmentation. Popliteal Vein: No evidence of thrombus. Normal compressibility, respiratory phasicity and response to augmentation. Calf Veins: No evidence of thrombus. Normal compressibility and flow on color Doppler imaging. Superficial Great Saphenous Vein: No evidence of thrombus. Normal compressibility and flow on color Doppler imaging. Venous Reflux:  None. Other Findings:  Diffuse calf edema is present. LEFT LOWER EXTREMITY  Common Femoral Vein: No evidence of thrombus. Normal compressibility, respiratory phasicity and response to augmentation. Saphenofemoral Junction: No evidence of thrombus. Normal compressibility and flow on color Doppler imaging. Profunda Femoral Vein: No evidence of thrombus. Normal compressibility and flow on color Doppler imaging. Femoral Vein: No evidence of thrombus. Normal compressibility, respiratory phasicity and response to augmentation. Popliteal Vein: No evidence of thrombus. Normal compressibility, respiratory phasicity and response to augmentation. Calf Veins: No evidence of thrombus. Normal compressibility and flow on color Doppler imaging. Superficial Great Saphenous Vein: No evidence of thrombus. Normal compressibility and flow on color Doppler imaging. Venous Reflux:  None. Other Findings:  Diffuse calf edema is present. IMPRESSION: No evidence of DVT within either lower extremity. Edema is present within the subcutaneous tissues of the calf bilaterally. Electronically Signed   By: San Morelle M.D.   On: 03/23/2017 14:48   Dg Chest Port 1 View  Result Date: 04/10/2017 CLINICAL DATA:  Respiratory failure. EXAM: PORTABLE CHEST 1 VIEW COMPARISON:  04/09/2017 . FINDINGS: Cardiomegaly with pulmonary vascular congestion and mild interstitial edema again noted. Bibasilar atelectasis. Bibasilar pneumonia cannot be excluded. Small bilateral pleural effusions again noted. No pneumothorax . IMPRESSION: 1. Cardiomegaly with mild pulmonary venous congestion interstitial edema. Small bilateral pleural effusions. No change. 2. Low lung volumes with bibasilar atelectasis. Bibasilar pneumonia cannot be excluded. Chest is unchanged from prior exam. Electronically Signed   By: Marcello Moores  Register   On: 04/10/2017 06:58   Dg Chest Port 1 View  Result Date: 04/09/2017 CLINICAL DATA:  Sepsis. EXAM: PORTABLE CHEST 1 VIEW COMPARISON:  04/08/2017. FINDINGS: Cardiomegaly with persistent pulmonary venous  congestion with mild basilar interstitial edema and small bilateral pleural effusions. Basilar atelectasis. IMPRESSION: 1. Persistent cardiomegaly with mild pulmonary venous congestion and basilar interstitial edema. Small bilateral pleural effusions. 2. Persistent bibasilar atelectasis. Bibasilar pneumonia cannot be excluded . Electronically Signed   By: Marcello Moores  Register   On: 04/09/2017 06:42   Dg Chest Port 1 View  Result Date: 04/08/2017 CLINICAL DATA:  66 y/o  F; bilateral lower extremity edema. EXAM: PORTABLE CHEST 1 VIEW COMPARISON:  09/18/2014 chest radiograph FINDINGS: Moderate cardiomegaly. Pulmonary venous hypertension. Moderate right pleural effusion and right basilar opacity. No acute osseous abnormality is evident. IMPRESSION: Cardiomegaly. Pulmonary venous hypertension. Moderate right pleural effusion. Right basilar opacity may represent atelectasis or pneumonia. Electronically Signed   By: Kristine Garbe M.D.   On: 04/08/2017 02:20    EKG: Atrial fibrillation  ASSESSMENT AND PLAN:   1. 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 Borderline elevated troponin, in the absence of chest pain, very likely due to demand supply ischemia, not due to acute coronary syndrome or non-STEMI 3. Left lower extremity cellulitis, septic shock, on wide spectrum antibiotics, with hypertension 4. Acute on chronic renal failure, on CRRT, BUN and creatinine stabilized  Recommendations  1. Agree with overall current therapy 2. Defer full dose anticoagulation 3. Continue amiodarone for rate control for now 4. Review outside records for previous evaluation of LV function 5. Add  low-dose carvedilol or metoprolol succinate when blood pressure stabilized  Signed: Isaias Cowman MD,PhD, [facility] 04/11/2017, 1:46 PM

## 2017-04-11 NOTE — Plan of Care (Signed)
Problem: Safety: Goal: Ability to remain free from injury will improve Outcome: Progressing Pt will be free of injury. Safe environment provided.

## 2017-04-11 NOTE — Progress Notes (Signed)
Pharmacy Antibiotic Note/CRRT Dose Adjustment  [name] is a 66 y.o. female admitted on 04/08/2017 with progressive edema with bullae and cellulitis and anasarca.  Pharmacy has been consulted for Zosyn dosing. CRRT has now been stopped - HD ordered.   Plan: 1. Change to Zosyn 3.375 g EI q 12 hours.   2. No other medications require adjustment at present. Will continue to monitor.   Height: 5\' 5"  (165.1 cm) Weight: 182 lb 5.1 oz (82.7 kg) IBW/kg (Calculated) : 57  Temp (24hrs), Avg:97.4 F (36.3 C), Min:96.4 F (35.8 C), Max:98.3 F (36.8 C)   Recent Labs Lab 04/08/17 0139 04/08/17 0204 04/08/17 0509  04/09/17 0003 04/09/17 0423 04/09/17 1710  04/10/17 0533  04/10/17 1713 04/10/17 2054 04/11/17 0053 04/11/17 0458 04/11/17 0917  WBC 13.2*  --   --   --  12.9* 13.1*  --   --   --   --   --   --   --   --   --   CREATININE 1.94*  --   --   < > 2.18* 2.01* 2.07*  2.28*  < >  --   < > 0.79 0.88 0.79 0.75 0.70  LATICACIDVEN  --  5.2* 4.5*  --  4.3* 5.0*  --   --  2.7*  --   --   --   --   --   --   VANCORANDOM  --   --   --   --   --   --  18  --   --   --   --   --   --   --   --   < > = values in this interval not displayed.  Estimated Creatinine Clearance: 74.5 mL/min (by C-G formula based on SCr of 0.7 mg/dL).    No Known Allergies  Antimicrobials this admission: Vancomycin 7/1 >> 7/2 Zosyn 7/1 >> 7/2, 7/3 >>  Dose adjustments this admission:  Microbiology results: Wound Cx: NGTD UCx: NG BCx: NGTD GI PCR: negative C diff: negative MRSA PCR: negative  Thank you for allowing pharmacy to be a part of this patient's care.  Laural Benes, Pharm.D., BCPS Clinical Pharmacist 04/11/2017 2:26 PM

## 2017-04-11 NOTE — Progress Notes (Signed)
ID E note No fevers, hypothermic now.  On amio. Not on pressors. On CRRT. BCX and wound cx negative.  Rec Cont supportive care  Cont zosyn

## 2017-04-11 NOTE — Progress Notes (Signed)
Pharmacy Antibiotic Note/CRRT Dose Adjustment  [name] is a 66 y.o. female admitted on 04/08/2017 with progressive edema with bullae and cellulitis and anasarca.  Pharmacy has been consulted for Zosyn dosing. CRRT restarted. ordered.   Plan: 1. Change to Zosyn 3.375 g EI q 8 hours.   2. No other medications require adjustment at present. Will continue to monitor.   Height: 5\' 5"  (165.1 cm) Weight: 182 lb 5.1 oz (82.7 kg) IBW/kg (Calculated) : 57  Temp (24hrs), Avg:97.2 F (36.2 C), Min:96.4 F (35.8 C), Max:98.3 F (36.8 C)   Recent Labs Lab 04/08/17 0139 04/08/17 0204 04/08/17 0509  04/09/17 0003 04/09/17 0423 04/09/17 1710  04/10/17 0533  04/10/17 2054 04/11/17 0053 04/11/17 0458 04/11/17 0917 04/11/17 1639  WBC 13.2*  --   --   --  12.9* 13.1*  --   --   --   --   --   --   --   --  10.2  CREATININE 1.94*  --   --   < > 2.18* 2.01* 2.07*  2.28*  < >  --   < > 0.88 0.79 0.75 0.70 1.06*  LATICACIDVEN  --  5.2* 4.5*  --  4.3* 5.0*  --   --  2.7*  --   --   --   --   --   --   VANCORANDOM  --   --   --   --   --   --  18  --   --   --   --   --   --   --   --   < > = values in this interval not displayed.  Estimated Creatinine Clearance: 56.2 mL/min (A) (by C-G formula based on SCr of 1.06 mg/dL (H)).    No Known Allergies  Antimicrobials this admission: Vancomycin 7/1 >> 7/2 Zosyn 7/1 >> 7/2, 7/3 >>  Dose adjustments this admission:  Microbiology results: Wound Cx: NGTD UCx: NG BCx: NGTD GI PCR: negative C diff: negative MRSA PCR: negative  Thank you for allowing pharmacy to be a part of this patient's care.  Laural Benes, Pharm.D., BCPS Clinical Pharmacist 04/11/2017 8:32 PM

## 2017-04-11 NOTE — Progress Notes (Signed)
PULMONARY / CRITICAL CARE MEDICINE   Name: [name] MRN: [id number] DOB: 10/02/1951    ADMISSION DATE:  04/08/2017  PT PROFILE:   66 y.o. F with PMH of CAF, hypothyroidism admitted via ED to ICU with many days of progressive LE edema, blistering lesions on BLE, progressive weakness. Adm diagnosis of septic shock, cellulitis, AKI  MAJOR EVENTS/TEST RESULTS: 07/01 LE venous US: no DVT  07/01 Nephrology consultation 07/02 ID consultation 07/02 CRRT initiated 07/03 Off vasopressors. Worsening tachycardia - amiodarone initiated 07/4 Echocardiogram revealed EF 25% to 30% therefore Cardiologist Dr. Nehemiah Massed consulted   INDWELLING DEVICES:: R fem HD cath 07/02 >>   MICRO DATA: MRSA PCR 07/01 >> NEG Urine 07/01 >> NEG>> Blood 07/01 >> NEG>> C diff 07/01 >> NEG GI panel 07/02 >> NEG Wound Culture 07/2>> NEG  Serologic workup: compliments normal, negative SPEP/UPEP, ANA negative, ANCA negative, GBM negative, negative hepatitis  ANTIMICROBIALS:  Vanc 07/01 >> 07/02 Pip-tazo 07/01 >>   SUBJECTIVE:  Pt is anxious regarding hemodialysis she states she is fearful.  VITAL SIGNS: BP (!) 84/68   Pulse 64   Temp 98.3 F (36.8 C) (Axillary)   Resp (!) 36   Ht 5\' 5"  (1.651 m)   Wt 82.7 kg (182 lb 5.1 oz)   SpO2 95%   BMI 30.34 kg/m   HEMODYNAMICS:    VENTILATOR SETTINGS:    INTAKE / OUTPUT: I/O last 3 completed shifts: In: 3200.9 [I.V.:2040.9; IV Piggyback:1160] Out: 8177 [Urine:260; NHAFB:9038; Stool:100]  PHYSICAL EXAMINATION: General: NAD Neuro: CNs intact, MAEs HEENT: NCAT, sclerae whhite Cardiovascular: IRIR, mildly tachy, no M Lungs: crackles throughout, even, non labored  Abdomen: hypoactive BS x4, obese, soft, NT, ND Ext: severe BLE edema from hips down, multiple bilateral bullous lesions  LABS:  BMET  Recent Labs Lab 04/11/17 0053 04/11/17 0458 04/11/17 0917  NA 140 139 141  K 4.1 4.4 4.7  CL 105 105 105  CO2 25 25 23   BUN 14 12 12   CREATININE  0.79 0.75 0.70  GLUCOSE 113* 128* 89    Electrolytes  Recent Labs Lab 04/11/17 0053 04/11/17 0458 04/11/17 0917  CALCIUM 9.1 8.9 9.4  MG 1.8 1.8 1.8  PHOS 2.0* 3.0 2.4*    CBC  Recent Labs Lab 04/08/17 0139 04/09/17 0003 04/09/17 0423 04/10/17 0534  WBC 13.2* 12.9* 13.1*  --   HGB 15.0 13.7 12.9  --   HCT 45.2 41.6 39.0  --   PLT 142* 115* 91* 49*    Coag's  Recent Labs Lab 04/08/17 1217 04/09/17 0003 04/10/17 0534 04/11/17 0458  APTT 111* 113* 107* 76*  INR 6.37* 5.67* 2.65  --     Sepsis Markers  Recent Labs Lab 04/09/17 0003 04/09/17 0423 04/09/17 1710 04/10/17 0533 04/10/17 0534 04/11/17 0458  LATICACIDVEN 4.3* 5.0*  --  2.7*  --   --   PROCALCITON  --   --  58.86  --  43.68 20.01    ABG No results for input(s): PHART, PCO2ART, PO2ART in the last 168 hours.  Liver Enzymes  Recent Labs Lab 04/08/17 0139  04/10/17 0138  04/11/17 0053 04/11/17 0458 04/11/17 0917  AST 46*  --  26  --   --   --   --   ALT 44  --  24  --   --   --   --   ALKPHOS 166*  --  107  --   --   --   --   BILITOT 1.8*  --  1.1  --   --   --   --   ALBUMIN 2.8*  < > 2.0*  2.2*  < > 4.3 4.0 4.4  < > = values in this interval not displayed.  Cardiac Enzymes  Recent Labs Lab 04/08/17 0139  TROPONINI 0.05*    Glucose  Recent Labs Lab 04/08/17 0637 04/08/17 0719  GLUCAP 65 65    CXR: NSC moderate R pleural effusion    ASSESSMENT / PLAN:  PULMONARY A: Mild hypoxemia R pleural effusion P:   Cont supplemental O2 to maintain SpO2 > 92%  CARDIOVASCULAR A:  Chronic AF - now with RVR Septic shock, resolved Echocardiogram 07/1-EF 25% to 30% P:  Maintain map >65 Continue po amiodarone initiated 07/03 Cardiology consulted appreciate input   RENAL A:   AKI, oliguric-improving  Mild metabolic acidosis-resolved  P:   Monitor BMET intermittently Monitor I/Os Correct electrolytes as indicated Per Nephrology will d/c CRRT 07/4 and transition  to intermittent hemodialysis once more stable may need renal biopsy   GASTROINTESTINAL A:   Diarrhea P:   SUP: N/I Cont reg diet  HEMATOLOGIC A:   Coagulopathy - improved Thrombocytopenia Chronic anticoagulation with Pradaxa P:  Holding Pradaxa Monitor DIC panel   INFECTIOUS A:   Elevated PCT-improving  Severe sepsis-improving  Presumed LE cellulitis P:   Monitor temp, WBC count Trend PCT  Micro and abx as above ID consulted appreciate input   ENDOCRINE A:   Hyperglycemia, resolved Episodic hypoglycemia P:   Monitor glu on chem panels Consider SSI for glu > 180  NEUROLOGIC A:   Mild delirium-improving  P:   RASS goal: 0 Minimize sedation Will restart outpatient zoloft   FAMILY  - Updates: Husband updated in detail  [name], [place] Pager [phone] (please enter 7 digits) PCCM Consult Pager [phone] (please enter 7 digits)

## 2017-04-11 NOTE — Progress Notes (Signed)
Pharmacy Antibiotic Note/CRRT Dose Adjustment  [name] is a 66 y.o. female admitted on 04/08/2017 with progressive edema with bullae and cellulitis and anasarca.  Pharmacy has been consulted for Zosyn dosing. Patient is currently requiring CRRT.   Plan: 1. Continue Zosyn 3.375 g EI q 8 hours.   2. No medications require adjustment for CRRT at present. Will continue to monitor.   Height: 5\' 5"  (165.1 cm) Weight: 182 lb 5.1 oz (82.7 kg) IBW/kg (Calculated) : 57  Temp (24hrs), Avg:97.2 F (36.2 C), Min:96.4 F (35.8 C), Max:97.9 F (36.6 C)   Recent Labs Lab 04/08/17 0139 04/08/17 0204 04/08/17 0509  04/09/17 0003 04/09/17 0423 04/09/17 1710  04/10/17 0533  04/10/17 0932 04/10/17 1713 04/10/17 2054 04/11/17 0053 04/11/17 0458  WBC 13.2*  --   --   --  12.9* 13.1*  --   --   --   --   --   --   --   --   --   CREATININE 1.94*  --   --   < > 2.18* 2.01* 2.07*  2.28*  < >  --   < > 1.17* 0.79 0.88 0.79 0.75  LATICACIDVEN  --  5.2* 4.5*  --  4.3* 5.0*  --   --  2.7*  --   --   --   --   --   --   VANCORANDOM  --   --   --   --   --   --  18  --   --   --   --   --   --   --   --   < > = values in this interval not displayed.  Estimated Creatinine Clearance: 74.5 mL/min (by C-G formula based on SCr of 0.75 mg/dL).    No Known Allergies  Antimicrobials this admission: Vancomycin 7/1 >> 7/2 Zosyn 7/1 >> 7/2, 7/3 >>  Dose adjustments this admission:  Microbiology results: Wound Cx: NGTD UCx: NG BCx: NGTD GI PCR: negative C diff: negative MRSA PCR: negative  Thank you for allowing pharmacy to be a part of this patient's care.  Ulice Dash D 04/11/2017 8:03 AM

## 2017-04-12 ENCOUNTER — Inpatient Hospital Stay: Payer: Medicare Other

## 2017-04-12 DIAGNOSIS — I469 Cardiac arrest, cause unspecified: Secondary | ICD-10-CM

## 2017-04-12 DIAGNOSIS — J810 Acute pulmonary edema: Secondary | ICD-10-CM

## 2017-04-12 DIAGNOSIS — I420 Dilated cardiomyopathy: Secondary | ICD-10-CM

## 2017-04-12 DIAGNOSIS — J9601 Acute respiratory failure with hypoxia: Secondary | ICD-10-CM

## 2017-04-12 DIAGNOSIS — R570 Cardiogenic shock: Secondary | ICD-10-CM

## 2017-04-12 LAB — COMPREHENSIVE METABOLIC PANEL
ALT: 2160 U/L — AB (ref 14–54)
AST: 3323 U/L — ABNORMAL HIGH (ref 15–41)
Albumin: 3.5 g/dL (ref 3.5–5.0)
Alkaline Phosphatase: 304 U/L — ABNORMAL HIGH (ref 38–126)
Anion gap: 13 (ref 5–15)
BUN: 14 mg/dL (ref 6–20)
CHLORIDE: 106 mmol/L (ref 101–111)
CO2: 21 mmol/L — AB (ref 22–32)
CREATININE: 0.65 mg/dL (ref 0.44–1.00)
Calcium: 8.1 mg/dL — ABNORMAL LOW (ref 8.9–10.3)
GFR calc non Af Amer: >60 mL/min (ref >60)
Glucose, Bld: 126 mg/dL — ABNORMAL HIGH (ref 65–99)
POTASSIUM: 4 mmol/L (ref 3.5–5.1)
SODIUM: 140 mmol/L (ref 135–145)
Total Bilirubin: 4.7 mg/dL — ABNORMAL HIGH (ref 0.3–1.2)
Total Protein: 5.2 g/dL — ABNORMAL LOW (ref 6.5–8.1)

## 2017-04-12 LAB — GLUCOSE, CAPILLARY
Glucose-Capillary: 116 mg/dL — ABNORMAL HIGH (ref 65–99)
Glucose-Capillary: 119 mg/dL — ABNORMAL HIGH (ref 65–99)
Glucose-Capillary: 125 mg/dL — ABNORMAL HIGH (ref 65–99)
Glucose-Capillary: 128 mg/dL — ABNORMAL HIGH (ref 65–99)
Glucose-Capillary: 134 mg/dL — ABNORMAL HIGH (ref 65–99)
Glucose-Capillary: 144 mg/dL — ABNORMAL HIGH (ref 65–99)
Glucose-Capillary: 75 mg/dL (ref 65–99)

## 2017-04-12 LAB — RENAL FUNCTION PANEL
ALBUMIN: 3.4 g/dL — AB (ref 3.5–5.0)
ALBUMIN: 3.7 g/dL (ref 3.5–5.0)
ANION GAP: 15 (ref 5–15)
Albumin: 3.4 g/dL — ABNORMAL LOW (ref 3.5–5.0)
Albumin: 3.5 g/dL (ref 3.5–5.0)
Albumin: 3.6 g/dL (ref 3.5–5.0)
Anion gap: 14 (ref 5–15)
Anion gap: 15 (ref 5–15)
Anion gap: 16 — ABNORMAL HIGH (ref 5–15)
Anion gap: 16 — ABNORMAL HIGH (ref 5–15)
BUN: 13 mg/dL (ref 6–20)
BUN: 13 mg/dL (ref 6–20)
BUN: 14 mg/dL (ref 6–20)
BUN: 15 mg/dL (ref 6–20)
BUN: 15 mg/dL (ref 6–20)
CALCIUM: 8 mg/dL — AB (ref 8.9–10.3)
CALCIUM: 8.3 mg/dL — AB (ref 8.9–10.3)
CALCIUM: 8.3 mg/dL — AB (ref 8.9–10.3)
CHLORIDE: 107 mmol/L (ref 101–111)
CHLORIDE: 106 mmol/L (ref 101–111)
CO2: 19 mmol/L — ABNORMAL LOW (ref 22–32)
CO2: 20 mmol/L — ABNORMAL LOW (ref 22–32)
CO2: 20 mmol/L — ABNORMAL LOW (ref 22–32)
CO2: 21 mmol/L — AB (ref 22–32)
CO2: 21 mmol/L — ABNORMAL LOW (ref 22–32)
CREATININE: 0.76 mg/dL (ref 0.44–1.00)
CREATININE: 0.78 mg/dL (ref 0.44–1.00)
CREATININE: 0.9 mg/dL (ref 0.44–1.00)
Calcium: 8.2 mg/dL — ABNORMAL LOW (ref 8.9–10.3)
Calcium: 8.3 mg/dL — ABNORMAL LOW (ref 8.9–10.3)
Chloride: 106 mmol/L (ref 101–111)
Chloride: 107 mmol/L (ref 101–111)
Chloride: 108 mmol/L (ref 101–111)
Creatinine, Ser: 0.83 mg/dL (ref 0.44–1.00)
Creatinine, Ser: 0.73 mg/dL (ref 0.44–1.00)
GFR calc Af Amer: >60 mL/min (ref >60)
GFR calc Af Amer: >60 mL/min (ref >60)
GFR calc Af Amer: >60 mL/min (ref >60)
GFR calc Af Amer: >60 mL/min (ref >60)
GFR calc non Af Amer: >60 mL/min (ref >60)
GFR calc non Af Amer: >60 mL/min (ref >60)
GFR calc non Af Amer: >60 mL/min (ref >60)
GLUCOSE: 120 mg/dL — AB (ref 65–99)
GLUCOSE: 123 mg/dL — AB (ref 65–99)
GLUCOSE: 145 mg/dL — AB (ref 65–99)
Glucose, Bld: 114 mg/dL — ABNORMAL HIGH (ref 65–99)
Glucose, Bld: 138 mg/dL — ABNORMAL HIGH (ref 65–99)
PHOSPHORUS: 2.4 mg/dL — AB (ref 2.5–4.6)
PHOSPHORUS: 3 mg/dL (ref 2.5–4.6)
POTASSIUM: 3.7 mmol/L (ref 3.5–5.1)
POTASSIUM: 3.8 mmol/L (ref 3.5–5.1)
Phosphorus: 2.3 mg/dL — ABNORMAL LOW (ref 2.5–4.6)
Phosphorus: 2.4 mg/dL — ABNORMAL LOW (ref 2.5–4.6)
Phosphorus: 2.4 mg/dL — ABNORMAL LOW (ref 2.5–4.6)
Potassium: 3.6 mmol/L (ref 3.5–5.1)
Potassium: 4 mmol/L (ref 3.5–5.1)
Potassium: 4.2 mmol/L (ref 3.5–5.1)
SODIUM: 142 mmol/L (ref 135–145)
SODIUM: 143 mmol/L (ref 135–145)
SODIUM: 143 mmol/L (ref 135–145)
Sodium: 143 mmol/L (ref 135–145)
Sodium: 140 mmol/L (ref 135–145)

## 2017-04-12 LAB — BLOOD GAS, ARTERIAL
ACID-BASE DEFICIT: 5.9 mmol/L — AB (ref 0.0–2.0)
Acid-base deficit: 9 mmol/L — ABNORMAL HIGH (ref 0.0–2.0)
Bicarbonate: 16.8 mmol/L — ABNORMAL LOW (ref 20.0–28.0)
Bicarbonate: 19.5 mmol/L — ABNORMAL LOW (ref 20.0–28.0)
FIO2: 0.6
FIO2: 0.5
MECHVT: 500 mL
O2 SAT: 96 %
O2 SAT: 95.8 %
PATIENT TEMPERATURE: 37
PCO2 ART: 35 mmHg (ref 32.0–48.0)
PCO2 ART: 37 mmHg (ref 32.0–48.0)
PEEP/CPAP: 5 cmH2O
PEEP: 5 cmH2O
PH ART: 7.33 — AB (ref 7.350–7.450)
PO2 ART: 91 mmHg (ref 83.0–108.0)
Patient temperature: 37
RATE: 16 resp/min
RATE: 16 resp/min
VT: 500 mL
pH, Arterial: 7.29 — ABNORMAL LOW (ref 7.350–7.450)
pO2, Arterial: 86 mmHg (ref 83.0–108.0)

## 2017-04-12 LAB — CBC WITH DIFFERENTIAL/PLATELET
BASOS PCT: 0 %
Band Neutrophils: 1 %
Basophils Absolute: 0 10*3/uL (ref 0–0.1)
Blasts: 0 %
EOS PCT: 0 %
Eosinophils Absolute: 0 10*3/uL (ref 0–0.7)
HEMATOCRIT: 24.3 % — AB (ref 35.0–47.0)
Hemoglobin: 7.9 g/dL — ABNORMAL LOW (ref 12.0–16.0)
LYMPHS ABS: 1 10*3/uL (ref 1.0–3.6)
LYMPHS PCT: 7 %
MCH: 30.5 pg (ref 26.0–34.0)
MCHC: 32.6 g/dL (ref 32.0–36.0)
MCV: 93.5 fL (ref 80.0–100.0)
MONO ABS: 0.4 10*3/uL (ref 0.2–0.9)
MONOS PCT: 3 %
Metamyelocytes Relative: 2 %
Myelocytes: 1 %
NEUTROS ABS: 12.7 10*3/uL — AB (ref 1.4–6.5)
NEUTROS PCT: 86 %
OTHER: 0 %
PLATELETS: 103 10*3/uL — AB (ref 150–440)
Promyelocytes Absolute: 0 %
RBC: 2.6 MIL/uL — AB (ref 3.80–5.20)
RDW: 18.1 % — AB (ref 11.5–14.5)
WBC: 14.1 10*3/uL — AB (ref 3.6–11.0)
nRBC: 5 /100 WBC — ABNORMAL HIGH

## 2017-04-12 LAB — MAGNESIUM
MAGNESIUM: 2 mg/dL (ref 1.7–2.4)
MAGNESIUM: 1.9 mg/dL (ref 1.7–2.4)
Magnesium: 1.6 mg/dL — ABNORMAL LOW (ref 1.7–2.4)
Magnesium: 2 mg/dL (ref 1.7–2.4)
Magnesium: 2.2 mg/dL (ref 1.7–2.4)

## 2017-04-12 LAB — ABO/RH: ABO/RH(D): O POS

## 2017-04-12 LAB — CBC
HCT: 22.3 % — ABNORMAL LOW (ref 35.0–47.0)
Hemoglobin: 7.1 g/dL — ABNORMAL LOW (ref 12.0–16.0)
MCH: 30.7 pg (ref 26.0–34.0)
MCHC: 31.9 g/dL — ABNORMAL LOW (ref 32.0–36.0)
MCV: 96.4 fL (ref 80.0–100.0)
PLATELETS: 37 10*3/uL — AB (ref 150–440)
RBC: 2.31 MIL/uL — AB (ref 3.80–5.20)
RDW: 17 % — ABNORMAL HIGH (ref 11.5–14.5)
WBC: 12.7 10*3/uL — ABNORMAL HIGH (ref 3.6–11.0)

## 2017-04-12 LAB — CORTISOL-AM, BLOOD: Cortisol - AM: 61.2 ug/dL — ABNORMAL HIGH (ref 6.7–22.6)

## 2017-04-12 LAB — TROPONIN I
TROPONIN I: 0.09 ng/mL — AB (ref <0.03)
Troponin I: 0.08 ng/mL (ref <0.03)

## 2017-04-12 LAB — OCCULT BLOOD X 1 CARD TO LAB, STOOL: Fecal Occult Bld: POSITIVE — AB

## 2017-04-12 LAB — PREPARE RBC (CROSSMATCH)

## 2017-04-12 LAB — PHOSPHORUS
PHOSPHORUS: 2.2 mg/dL — AB (ref 2.5–4.6)
Phosphorus: 2.7 mg/dL (ref 2.5–4.6)

## 2017-04-12 LAB — APTT: aPTT: 74 seconds — ABNORMAL HIGH (ref 24–36)

## 2017-04-12 LAB — PROCALCITONIN: Procalcitonin: 11.38 ng/mL

## 2017-04-12 MED ORDER — MAGNESIUM SULFATE 2 GM/50ML IV SOLN
2.0000 g | Freq: Once | INTRAVENOUS | Status: AC
  Administered 2017-04-12: 2 g via INTRAVENOUS
  Filled 2017-04-12: qty 50

## 2017-04-12 MED ORDER — SODIUM CHLORIDE 0.9 % IV SOLN
Freq: Once | INTRAVENOUS | Status: AC
  Administered 2017-04-12: 15:00:00 via INTRAVENOUS

## 2017-04-12 MED ORDER — AMIODARONE IV BOLUS ONLY 150 MG/100ML
150.0000 mg | Freq: Once | INTRAVENOUS | Status: AC
  Administered 2017-04-12: 150 mg via INTRAVENOUS
  Filled 2017-04-12: qty 100

## 2017-04-12 MED ORDER — DEXTROSE 50 % IV SOLN
INTRAVENOUS | Status: AC
  Administered 2017-04-12: 25 g via INTRAVENOUS
  Filled 2017-04-12: qty 50

## 2017-04-12 MED ORDER — AMIODARONE HCL IN DEXTROSE 360-4.14 MG/200ML-% IV SOLN
60.0000 mg/h | INTRAVENOUS | Status: AC
  Administered 2017-04-12 (×2): 60 mg/h via INTRAVENOUS
  Filled 2017-04-12: qty 200

## 2017-04-12 MED ORDER — PUREFLOW DIALYSIS SOLUTION
INTRAVENOUS | Status: DC
  Administered 2017-04-12 (×2): via INTRAVENOUS_CENTRAL
  Administered 2017-04-13: 2000 via INTRAVENOUS_CENTRAL
  Administered 2017-04-13 (×2): via INTRAVENOUS_CENTRAL
  Administered 2017-04-14 (×2): 3 via INTRAVENOUS_CENTRAL
  Administered 2017-04-14: 2000 via INTRAVENOUS_CENTRAL
  Administered 2017-04-15: 3 via INTRAVENOUS_CENTRAL
  Administered 2017-04-15: 2000 via INTRAVENOUS_CENTRAL
  Administered 2017-04-17 (×2): via INTRAVENOUS_CENTRAL
  Administered 2017-04-17: 3 via INTRAVENOUS_CENTRAL
  Administered 2017-04-17: 10:00:00 via INTRAVENOUS_CENTRAL
  Administered 2017-04-17: 1 mL via INTRAVENOUS_CENTRAL
  Administered 2017-04-17 (×2): via INTRAVENOUS_CENTRAL
  Administered 2017-04-18 – 2017-04-21 (×8): 3 via INTRAVENOUS_CENTRAL

## 2017-04-12 MED ORDER — SODIUM CHLORIDE 0.9% FLUSH
10.0000 mL | INTRAVENOUS | Status: DC | PRN
  Administered 2017-04-30: 10 mL
  Filled 2017-04-12: qty 40

## 2017-04-12 MED ORDER — SODIUM CHLORIDE 0.9 % IV SOLN
Freq: Once | INTRAVENOUS | Status: AC
  Administered 2017-04-12: 12:00:00 via INTRAVENOUS

## 2017-04-12 MED ORDER — AMIODARONE HCL IN DEXTROSE 360-4.14 MG/200ML-% IV SOLN
60.0000 mg/h | INTRAVENOUS | Status: DC
  Administered 2017-04-12: 03:00:00 via INTRAVENOUS
  Administered 2017-04-12: 30 mg/h via INTRAVENOUS
  Administered 2017-04-12 – 2017-04-13 (×3): 60 mg/h via INTRAVENOUS
  Filled 2017-04-12 (×3): qty 200

## 2017-04-12 MED ORDER — ARTIFICIAL TEARS OPHTHALMIC OINT
TOPICAL_OINTMENT | OPHTHALMIC | Status: DC | PRN
  Administered 2017-04-12 – 2017-04-15 (×3): via OPHTHALMIC
  Filled 2017-04-12: qty 3.5

## 2017-04-12 MED ORDER — PANTOPRAZOLE SODIUM 40 MG IV SOLR
40.0000 mg | INTRAVENOUS | Status: DC
  Administered 2017-04-12 – 2017-04-17 (×6): 40 mg via INTRAVENOUS
  Filled 2017-04-12 (×6): qty 40

## 2017-04-12 MED ORDER — DEXTROSE 50 % IV SOLN
25.0000 g | Freq: Once | INTRAVENOUS | Status: AC
  Administered 2017-04-12: 25 g via INTRAVENOUS

## 2017-04-12 MED ORDER — AMIODARONE HCL IN DEXTROSE 360-4.14 MG/200ML-% IV SOLN
INTRAVENOUS | Status: AC
  Filled 2017-04-12: qty 200

## 2017-04-12 MED ORDER — SODIUM BICARBONATE 8.4 % IV SOLN
50.0000 meq | Freq: Once | INTRAVENOUS | Status: AC
Start: 2017-04-12 — End: 2017-04-12
  Administered 2017-04-12: 50 meq via INTRAVENOUS
  Filled 2017-04-12: qty 50

## 2017-04-12 MED FILL — Medication: Qty: 1 | Status: AC

## 2017-04-12 NOTE — Progress Notes (Signed)
Discussed with Dr. Juleen China on the phone about renal function panel potassium value of 3.7 with 2 K dialysate. MD ordered change to 4 K fluid.

## 2017-04-12 NOTE — Progress Notes (Signed)
[facility]  ROUNDING NOTE   Subjective:   Husband at bedside.  CRRT net +[phone]  Cardiac arrest yesterday. Now back on vasopressors: vasopressin, norepinephrine and phenylephrine.   afib with RVR.   Objective:  Vital signs in last 24 hours:  Temp:  [93.9 F (34.4 C)-98.3 F (36.8 C)] 98.1 F (36.7 C) (07/05 0815) Pulse Rate:  [25-141] 25 (07/05 0730) Resp:  [16-42] 20 (07/05 0815) BP: (48-132)/(27-96) 87/51 (07/05 0815) SpO2:  [65 %-100 %] 96 % (07/05 0815) FiO2 (%):  [60 %-100 %] 60 % (07/05 0700) Weight:  [89.7 kg (197 lb 12 oz)] 89.7 kg (197 lb 12 oz) (07/05 0400)  Weight change: 7 kg (15 lb 6.9 oz) Filed Weights   04/10/17 0500 04/11/17 0405 04/12/17 0400  Weight: 86 kg (189 lb 9.5 oz) 82.7 kg (182 lb 5.1 oz) 89.7 kg (197 lb 12 oz)    Intake/Output: I/O last 3 completed shifts: In: 3049.8 [P.O.:20; I.V.:2031.2; NG/GT:188.7; IV Piggyback:810] Out: 3809 [Urine:97; OZYYQ:8250; Stool:175]   Intake/Output this shift:  No intake/output data recorded.  Physical Exam: General: Critically ill  Head: Normocephalic, atraumatic. ETT, OGT  Eyes: Anicteric, PERRL  Neck: Supple, trachea midline  Lungs:  Bilateral crackles, PRVC FiO2 60%  Heart: Irregular, tachycardia  Abdomen:  Soft, nontender, obese  Extremities: 1+ weeping bullae lower extremity edema. Bilateral dressings - clean and dry.   Neurologic: Intubated, sedated  Skin: Bullous lesions with serous anginous   Access: Right femoral temp HD catheter Dr. Lucky Cowboy 7/2    Basic Metabolic Panel:  Recent Labs Lab 04/11/17 0917 04/11/17 1639 04/11/17 2145 04/11/17 2353 04/12/17 0450 04/12/17 0732  NA 141 137 143  142 143 140  --   K 4.7 6.0* 4.2  4.2 4.2 4.0  --   CL 105 103 108  108 108 106  --   CO2 23 21* 20*  21* 21* 21*  --   GLUCOSE 89 340* 173*  173* 145* 126*  --   BUN '12 15 16  16 15 14  ' --   CREATININE 0.70 1.06* 0.96  0.96 0.90 0.65  --   CALCIUM 9.4 7.8* 7.9*  7.9* 8.0* 8.1*  --    MG 1.8 1.8 1.7 1.6* 2.2 2.0  PHOS 2.4* 4.7* 3.2 3.0 2.7  --     Liver Function Tests:  Recent Labs Lab 04/08/17 0139  04/10/17 0138  04/11/17 0917 04/11/17 1639 04/11/17 2145 04/11/17 2353 04/12/17 0450  AST 46*  --  26  --   --  1,174*  --   --  3,323*  ALT 44  --  24  --   --  823*  --   --  2,160*  ALKPHOS 166*  --  107  --   --  214*  --   --  304*  BILITOT 1.8*  --  1.1  --   --  3.3*  --   --  4.7*  PROT 5.5*  --  4.3*  --   --  4.6*  --   --  5.2*  ALBUMIN 2.8*  < > 2.0*  2.2*  < > 4.4 3.1* 3.5 3.7 3.5  < > = values in this interval not displayed. No results for input(s): LIPASE, AMYLASE in the last 168 hours. No results for input(s): AMMONIA in the last 168 hours.  CBC:  Recent Labs Lab 04/08/17 0139 04/09/17 0003 04/09/17 0423 04/10/17 0534 04/11/17 1639 04/11/17 2145 04/12/17 0450  WBC 13.2* 12.9*  13.1*  --  10.2 13.0* 12.7*  NEUTROABS 12.0*  --   --   --  8.0* 12.0*  --   HGB 15.0 13.7 12.9  --  6.8* 7.6* 7.1*  HCT 45.2 41.6 39.0  --  21.3* 23.2* 22.3*  MCV 95.0 96.5 94.9  --  96.7 96.7 96.4  PLT 142* 115* 91* 49* 31* 38* 37*    Cardiac Enzymes:  Recent Labs Lab 04/08/17 0139  TROPONINI 0.05*    BNP: Invalid input(s): POCBNP  CBG:  Recent Labs Lab 04/11/17 1831 04/11/17 2009 04/11/17 2357 04/12/17 0410 04/12/17 0735  GLUCAP 171* 214* 134* 128* 125*    Microbiology: Results for orders placed or performed during the hospital encounter of 04/08/17  Culture, blood (routine x 2)     Status: None (Preliminary result)   Collection Time: 04/08/17  2:04 AM  Result Value Ref Range Status   Specimen Description BLOOD RIGHT ANTECUBITAL  Final   Special Requests   Final    BOTTLES DRAWN AEROBIC AND ANAEROBIC Blood Culture adequate volume   Culture NO GROWTH 4 DAYS  Final   Report Status PENDING  Incomplete  Culture, blood (routine x 2)     Status: None (Preliminary result)   Collection Time: 04/08/17  2:06 AM  Result Value Ref Range  Status   Specimen Description BLOOD LEFT ANTECUBITAL  Final   Special Requests   Final    BOTTLES DRAWN AEROBIC AND ANAEROBIC Blood Culture adequate volume   Culture NO GROWTH 4 DAYS  Final   Report Status PENDING  Incomplete  C difficile quick scan w PCR reflex     Status: None   Collection Time: 04/08/17  5:09 AM  Result Value Ref Range Status   C Diff antigen NEGATIVE NEGATIVE Final   C Diff toxin NEGATIVE NEGATIVE Final   C Diff interpretation No C. difficile detected.  Final  MRSA PCR Screening     Status: None   Collection Time: 04/08/17  6:50 AM  Result Value Ref Range Status   MRSA by PCR NEGATIVE NEGATIVE Final    Comment:        The GeneXpert MRSA Assay (FDA approved for NASAL specimens only), is one component of a comprehensive MRSA colonization surveillance program. It is not intended to diagnose MRSA infection nor to guide or monitor treatment for MRSA infections.   Urine culture     Status: None   Collection Time: 04/08/17 12:16 PM  Result Value Ref Range Status   Specimen Description URINE, RANDOM  Final   Special Requests NONE  Final   Culture   Final    NO GROWTH Performed at [facility], [address] [place], [place] [address]    Report Status 04/10/2017 FINAL  Final  Aerobic Culture (superficial specimen)     Status: None   Collection Time: 04/09/17 11:32 AM  Result Value Ref Range Status   Specimen Description SKIN  Final   Special Requests NONE  Final   Gram Stain   Final    RARE WBC PRESENT, PREDOMINANTLY PMN NO SQUAMOUS EPITHELIAL CELLS SEEN NO ORGANISMS SEEN    Culture   Final    NO GROWTH 2 DAYS Performed at [facility], [place] [address], [place], [place] [address]    Report Status 04/11/2017 FINAL  Final  Gastrointestinal Panel by PCR , Stool     Status: None   Collection Time: 04/09/17  5:10 PM  Result Value Ref Range  Status   Campylobacter species NOT DETECTED NOT DETECTED Final   Plesimonas shigelloides NOT DETECTED  NOT DETECTED Final   Salmonella species NOT DETECTED NOT DETECTED Final   Yersinia enterocolitica NOT DETECTED NOT DETECTED Final   Vibrio species NOT DETECTED NOT DETECTED Final   Vibrio cholerae NOT DETECTED NOT DETECTED Final   Enteroaggregative E coli (EAEC) NOT DETECTED NOT DETECTED Final   Enteropathogenic E coli (EPEC) NOT DETECTED NOT DETECTED Final   Enterotoxigenic E coli (ETEC) NOT DETECTED NOT DETECTED Final   Shiga like toxin producing E coli (STEC) NOT DETECTED NOT DETECTED Final   Shigella/Enteroinvasive E coli (EIEC) NOT DETECTED NOT DETECTED Final   Cryptosporidium NOT DETECTED NOT DETECTED Final   Cyclospora cayetanensis NOT DETECTED NOT DETECTED Final   Entamoeba histolytica NOT DETECTED NOT DETECTED Final   Giardia lamblia NOT DETECTED NOT DETECTED Final   Adenovirus F40/41 NOT DETECTED NOT DETECTED Final   Astrovirus NOT DETECTED NOT DETECTED Final   Norovirus GI/GII NOT DETECTED NOT DETECTED Final   Rotavirus A NOT DETECTED NOT DETECTED Final   Sapovirus (I, II, IV, and V) NOT DETECTED NOT DETECTED Final    Coagulation Studies:  Recent Labs  04/10/17 0534 04/11/17 1847  LABPROT 28.8* 42.2*  INR 2.65 4.27*    Urinalysis: No results for input(s): COLORURINE, LABSPEC, PHURINE, GLUCOSEU, HGBUR, BILIRUBINUR, KETONESUR, PROTEINUR, UROBILINOGEN, NITRITE, LEUKOCYTESUR in the last 72 hours.  Invalid input(s): APPERANCEUR    Imaging: Dg Chest Port 1 View  Result Date: 04/12/2017 CLINICAL DATA:  Respiratory failure. EXAM: PORTABLE CHEST 1 VIEW COMPARISON:  04/11/2017. FINDINGS: Endotracheal tube, left IJ line, NG tube in stable position. Cardiomegaly with prominent diffuse bilateral pulmonary infiltrates/ edema again noted without interim change. Small right pleural effusion again noted. No pneumothorax. IMPRESSION: 1.  Lines and tubes stable position. 2. Cardiomegaly with prominent diffuse bilateral pulmonary infiltrates/ edema and small right pleural effusion. No  significant interim change. Electronically Signed   By: Marcello Moores  Register   On: 04/12/2017 06:30   Dg Chest Port 1 View  Result Date: 04/11/2017 CLINICAL DATA:  Post intubation and NG tube placement. EXAM: PORTABLE CHEST 1 VIEW COMPARISON:  04/10/2017 FINDINGS: 1733 hours. Endotracheal tube tip is 3.2 cm above the base of the carina. Left IJ central line tip overlies the distal SVC region. The NG tube passes into the stomach although the distal tip position is not included on the film. The cardio pericardial silhouette is enlarged. Interval progression of diffuse interstitial and bilateral airspace disease. Persistent small right pleural effusion. Defibrillator pads overlie the chest. IMPRESSION: 1. Support apparatus appears appropriately positioned. 2. Bilateral interstitial and airspace disease compatible with diffuse pneumonia or asymmetric pulmonary edema. 3. Small right pleural effusion. Electronically Signed   By: Misty Stanley M.D.   On: 04/11/2017 17:44   Dg Abd Portable 1v  Result Date: 04/11/2017 CLINICAL DATA:  Intubation. EXAM: PORTABLE ABDOMEN - 1 VIEW COMPARISON:  CT from 04/04/2017 FINDINGS: Nasogastric tube with tip in the low stomach. The stomach is moderately gas distended. Rectal tube is also present. Right femoral catheter with tip at the L2-3 disc level. Amorphous density over the right iliac fossa is likely enteric. No calcifications seen in this area on recent CT. Questionable fold thickening of pelvic bowel loops. Bowel gas pattern is no overall nonobstructive. IMPRESSION: 1. Nasogastric tube tip is at the distal stomach. 2. Moderate gaseous distention of the stomach. 3. Right femoral venous catheter with tip at the L2-3 disc level. Electronically Signed  By: Monte Fantasia M.D.   On: 04/11/2017 18:07     Medications:   . famotidine (PEPCID) IV Stopped (04/11/17 1901)  . fentaNYL infusion INTRAVENOUS 50 mcg/hr (04/12/17 0712)  . norepinephrine (LEVOPHED) Adult infusion 8  mcg/min (04/12/17 0738)  . phenylephrine (NEO-SYNEPHRINE) Adult infusion 290 mcg/min (04/12/17 0815)  . piperacillin-tazobactam (ZOSYN)  IV 3.375 g (04/12/17 0600)  . pureflow 2,000 mL/hr at 04/12/17 0400  . vasopressin (PITRESSIN) infusion - *FOR SHOCK* 0.03 Units/min (04/11/17 1932)   . amiodarone  400 mg Oral BID  . chlorhexidine gluconate (MEDLINE KIT)  15 mL Mouth Rinse BID  . feeding supplement (ENSURE ENLIVE)  237 mL Oral TID  . feeding supplement (PRO-STAT SUGAR FREE 64)  30 mL Per Tube BID  . feeding supplement (VITAL HIGH PROTEIN)  1,000 mL Per Tube Q24H  . ipratropium-albuterol  3 mL Nebulization Q6H  . levothyroxine  25 mcg Intravenous Daily  . mouth rinse  15 mL Mouth Rinse 10 times per day  . sertraline  25 mg Oral Daily  . silver sulfADIAZINE   Topical BID  . tuberculin  5 Units Intradermal Once   fentaNYL, heparin, metoprolol tartrate, [DISCONTINUED] ondansetron **OR** ondansetron (ZOFRAN) IV, zinc oxide  Assessment/ Plan:  [name] is a 66 y.o. white female  with hypertension, hypothyroidism, atrial fibrillation, who was admitted to [facility] on 04/08/2017 for evaluation of increasing lower extremity edema.  1. Acute renal failure with proteinuria and hematuria: baseline creatinine of 0.62 09/2014.  Progressive peripheral edema and hypoalbuminemia.  Recent IV contrast exposure on 04/04/17.  - Serologic work up: compliments normal, negative SPEP/UPEP, ANA negative, ANCA negative, GBM negative, negative hepatitis - Continue CVVHD - Discussed case with husband  2. Hypotension with atrial fibrillation with rapid ventricular response: now off vasopressors: septic versus cardiogenic shock. Echocardiogram with EF of 25-30% - Broad spectrum antibiotics: pip/tazo. Appreciate ID input.   - amiodarone gtt  3. Anemia with renal failure and Thrombocytopenia: platelets down to 37.  - recommend transfusion PRBC  4. 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 Hemoglobin A1c 6.2% - continue glucose control.     LOS: [place], Neo Yepiz 7/5/20189:09 AM

## 2017-04-12 NOTE — Progress Notes (Signed)
While rounding ICU, nurse asked [place] to check on pt in [id number]. CH met pt, pt's daughter and husband were at the bedside and a nurse was attending to pt. [place] spoke with pt's family, provided spiritual and emotion support for pt and her family. [place] will follow up pt as needed.   04/12/17 1900  Clinical Encounter Type  Visited With Patient and family together  Visit Type Initial;Follow-up;Spiritual support  Referral From Nurse  Consult/Referral To Chaplain  Spiritual Encounters  Spiritual Needs Emotional;Other (Comment)

## 2017-04-12 NOTE — Progress Notes (Signed)
Chaplain visited with patient and family. Provided spiritual presence and pastoral care to family, as patient was not awake. Offered silent prayer.     04/12/17 1635  Clinical Encounter Type  Visited With Patient and family together  Visit Type Initial;Spiritual support;Critical Care  Referral From Chaplain  Consult/Referral To Chaplain  Spiritual Encounters  Spiritual Needs Emotional

## 2017-04-12 NOTE — Progress Notes (Signed)
Subjective: Patient coded last night and was resuscitated. Currently intubated and not very responsive.  Objective: Vital signs in last 24 hours: Temp:  [93.9 F (34.4 C)-98.4 F (36.9 C)] 97.5 F (36.4 C) (07/05 1556) Pulse Rate:  [25-141] 67 (07/05 1556) Resp:  [14-36] 23 (07/05 1556) BP: (48-132)/(27-103) 99/70 (07/05 1545) SpO2:  [77 %-100 %] 99 % (07/05 1556) FiO2 (%):  [50 %-100 %] 60 % (07/05 1556) Weight:  [89.7 kg (197 lb 12 oz)] 89.7 kg (197 lb 12 oz) (07/05 0400) Weight change: 7 kg (15 lb 6.9 oz) Last BM Date: 04/12/17  Intake/Output from previous day: 07/04 0701 - 07/05 0700 In: 2788.1 [P.O.:20; I.V.:2269.4; NG/GT:348.7; IV Piggyback:150] Out: 1609 [Urine:2; Stool:175] Intake/Output this shift: Total I/O In: 3164.9 [I.V.:2584.9; Blood:300; NG/GT:280] Out: 387 [Other:387]  General appearance: Critically ill looking female who is intubated Throat: ET tube in place Resp: Coarse breath sounds scattered rhonchi bilaterally Cardio: irregularly irregular rhythm Extremities: 2+ lower extremity edema with audible bullous lesions  Lab Results:  Recent Labs  04/11/17 2145 04/12/17 0450  WBC 13.0* 12.7*  HGB 7.6* 7.1*  HCT 23.2* 22.3*  PLT 38* 37*   BMET  Recent Labs  04/12/17 0732 04/12/17 1217  NA 143 143  K 3.7 3.6  CL 107 107  CO2 20* 20*  GLUCOSE 123* 114*  BUN 15 14  CREATININE 0.83 0.78  CALCIUM 8.2* 8.3*    Studies/Results: Dg Chest Port 1 View  Result Date: 04/12/2017 CLINICAL DATA:  Respiratory failure. EXAM: PORTABLE CHEST 1 VIEW COMPARISON:  04/11/2017. FINDINGS: Endotracheal tube, left IJ line, NG tube in stable position. Cardiomegaly with prominent diffuse bilateral pulmonary infiltrates/ edema again noted without interim change. Small right pleural effusion again noted. No pneumothorax. IMPRESSION: 1.  Lines and tubes stable position. 2. Cardiomegaly with prominent diffuse bilateral pulmonary infiltrates/ edema and small right pleural  effusion. No significant interim change. Electronically Signed   By: Marcello Moores  Register   On: 04/12/2017 06:30   Dg Chest Port 1 View  Result Date: 04/11/2017 CLINICAL DATA:  Post intubation and NG tube placement. EXAM: PORTABLE CHEST 1 VIEW COMPARISON:  04/10/2017 FINDINGS: 1733 hours. Endotracheal tube tip is 3.2 cm above the base of the carina. Left IJ central line tip overlies the distal SVC region. The NG tube passes into the stomach although the distal tip position is not included on the film. The cardio pericardial silhouette is enlarged. Interval progression of diffuse interstitial and bilateral airspace disease. Persistent small right pleural effusion. Defibrillator pads overlie the chest. IMPRESSION: 1. Support apparatus appears appropriately positioned. 2. Bilateral interstitial and airspace disease compatible with diffuse pneumonia or asymmetric pulmonary edema. 3. Small right pleural effusion. Electronically Signed   By: Misty Stanley M.D.   On: 04/11/2017 17:44   Dg Abd Portable 1v  Result Date: 04/11/2017 CLINICAL DATA:  Intubation. EXAM: PORTABLE ABDOMEN - 1 VIEW COMPARISON:  CT from 04/04/2017 FINDINGS: Nasogastric tube with tip in the low stomach. The stomach is moderately gas distended. Rectal tube is also present. Right femoral catheter with tip at the L2-3 disc level. Amorphous density over the right iliac fossa is likely enteric. No calcifications seen in this area on recent CT. Questionable fold thickening of pelvic bowel loops. Bowel gas pattern is no overall nonobstructive. IMPRESSION: 1. Nasogastric tube tip is at the distal stomach. 2. Moderate gaseous distention of the stomach. 3. Right femoral venous catheter with tip at the L2-3 disc level. Electronically Signed   By: Monte Fantasia  M.D.   On: 04/11/2017 18:07    Medications: I have reviewed the patient's current medications.  Assessment/Plan: 1. Status post cardiopulmonary arrest. Patient now requiring supportive care with  ventilator and multiple pressors.  2. Sepsiswith septic shock. Likely secondary to lower extremity wound infections. also may be pulmonary source also has her strep urinary antigen is positive. Now back on pressors. 3. Lower extremitycellulitis. Patient is on Zosyn . Cultures unrevealing so far . 4. Acute renal failure. Patient likely has nephrotic syndrome.  dialysis as needed 5. Lower extremity edema. Not sure what precipitated this.Perhaps nephrotic syndrome. Echocardiogram pending. Multiple serologies have been ordered to further assess.  6. Atrial fibrillation. Now on amiodarone for rate control.  7. Coagulopathy. Suspect this could be DIC. Continue supportive care and treatment of underlying calls and monitor coags. 8. Cardiomyopathy. Echocardiogram revealed EF of 25%. Etiology unclear.  9. Shock liver. LFTs are extremely elevated. Likely secondary to hypoperfusion during the code. Continue supportive care and monitor liver enzymes  LOS: 4 days   Baxter Hire 04/12/2017, 4:29 PM

## 2017-04-12 NOTE — Plan of Care (Signed)
Levophed had to be restarted due to hypotension despite phenylephrine at max dose of 400 mcg. Spoke with [name] NP about hypotension despite increasing/restarting vasopressors. Checked CBG which was 75. NP ordered one amp of d50. RN administered. BP appears to have improved with d50 administration and with increase in vasopressors. Team will continue to monitor.

## 2017-04-12 NOTE — Progress Notes (Signed)
[name] NP already aware of critical troponin value of 0.09. No new orders at this time.

## 2017-04-12 NOTE — Progress Notes (Addendum)
PULMONARY / CRITICAL CARE MEDICINE   Name: [name] MRN: [id number] DOB: [birth year]    ADMISSION DATE:  04/08/2017  PT PROFILE:   66 y.o. F with PMH of CAF, hypothyroidism admitted via ED to ICU with many days of progressive LE edema, blistering lesions on BLE, progressive weakness. Adm diagnosis of septic shock, cellulitis, AKI.  Pt PEA arrested 07/4 ACLS protocol initiated ROSC within 2 minutes following interventions.   MAJOR EVENTS/TEST RESULTS: 07/01 LE venous US: no DVT 07/01 Echocardiogram:  LVEF 25-30% 07/01 Nephrology consultation 07/02 ID consultation 07/02 CRRT initiated 07/03 Off vasopressors. Worsening tachycardia - amiodarone initiated 07/04 Pt PEA arrest ACLS protocol initiated ROSC 2 minutes now back on vasopressors  07/05 Intubated, shock req high dose vasopressors, on CRRT, elevated LFTs c/w congestion/shock liver, pulmonary edema vs ARDS on CXR  INDWELLING DEVICES:: R fem HD cath 07/02 >>  ETT 07/04 >>  Left IJ 07/04 >>   MICRO DATA: MRSA PCR 07/01 >> NEG Urine 07/01 >> NEG Blood 07/01 >> NEG C diff 07/01 >> NEG GI panel 07/02 >> NEG Resp 07/03 >>   ANTIMICROBIALS:  Vanc 07/01 >> 07/02 Pip-tazo 07/01 >>   SUBJECTIVE:  Pt intubated and mechanically ventilated. According to RN pt has followed commands.  VITAL SIGNS: BP (!) 87/51   Pulse (!) 25   Temp 98.1 F (36.7 C)   Resp 20   Ht 5\' 5"  (1.651 m)   Wt 89.7 kg (197 lb 12 oz)   SpO2 96%   BMI 32.91 kg/m   HEMODYNAMICS:    VENTILATOR SETTINGS: Vent Mode: PRVC FiO2 (%):  [50 %-100 %] 50 % Set Rate:  [16 bmp] 16 bmp Vt Set:  [500 mL] 500 mL PEEP:  [5 cmH20] 5 cmH20 Plateau Pressure:  [22 IPJ82-50 cmH20] 24 cmH20  INTAKE / OUTPUT: I/O last 3 completed shifts: In: 3049.8 [P.O.:20; I.V.:2031.2; NG/GT:188.7; IV Piggyback:810] Out: 3809 [Urine:97; NLZJQ:7341; Stool:175]  PHYSICAL EXAMINATION: General: acutely ill appearing Caucasian female, mechanically intubated  Neuro: sedated currently  not following commands, PERRL  HEENT: supple, no JVD jaundice bilateral pupils Cardiovascular: IRIR, tachy, no M/R/G Lungs: crackles throughout, even, non labored  Abdomen: obese, soft, ND Ext: severe BLE edema from hips down, multiple bilateral bullous lesions  LABS:  BMET  Recent Labs Lab 04/11/17 2145 04/11/17 2353 04/12/17 0450  NA 143  142 143 140  K 4.2  4.2 4.2 4.0  CL 108  108 108 106  CO2 20*  21* 21* 21*  BUN 16  16 15 14   CREATININE 0.96  0.96 0.90 0.65  GLUCOSE 173*  173* 145* 126*    Electrolytes  Recent Labs Lab 04/11/17 2145 04/11/17 2353 04/12/17 0450 04/12/17 0732  CALCIUM 7.9*  7.9* 8.0* 8.1*  --   MG 1.7 1.6* 2.2 2.0  PHOS 3.2 3.0 2.7  --     CBC  Recent Labs Lab 04/11/17 1639 04/11/17 2145 04/12/17 0450  WBC 10.2 13.0* 12.7*  HGB 6.8* 7.6* 7.1*  HCT 21.3* 23.2* 22.3*  PLT 31* 38* 37*    Coag's  Recent Labs Lab 04/09/17 0003 04/10/17 0534 04/11/17 0458 04/11/17 1847 04/12/17 0450  APTT 113* 107* 76* 141* 74*  INR 5.67* 2.65  --  4.27*  --     Sepsis Markers  Recent Labs Lab 04/09/17 0003 04/09/17 0423 04/09/17 1710 04/10/17 0533 04/10/17 0534 04/11/17 0458  LATICACIDVEN 4.3* 5.0*  --  2.7*  --   --   PROCALCITON  --   --  58.86  --  43.68 20.01    ABG  Recent Labs Lab 04/11/17 1730 04/12/17 0500  PHART 7.21* 7.29*  PCO2ART 50* 35  PO2ART 55* 91    Liver Enzymes  Recent Labs Lab 04/10/17 0138  04/11/17 1639 04/11/17 2145 04/11/17 2353 04/12/17 0450  AST 26  --  1,174*  --   --  3,323*  ALT 24  --  823*  --   --  2,160*  ALKPHOS 107  --  214*  --   --  304*  BILITOT 1.1  --  3.3*  --   --  4.7*  ALBUMIN 2.0*  2.2*  < > 3.1* 3.5 3.7 3.5  < > = values in this interval not displayed.  Cardiac Enzymes  Recent Labs Lab 04/08/17 0139  TROPONINI 0.05*    Glucose  Recent Labs Lab 04/11/17 1644 04/11/17 1831 04/11/17 2009 04/11/17 2357 04/12/17 0410 04/12/17 0735  GLUCAP 215*  171* 214* 134* 128* 125*    CXR: NSC moderate R pleural effusion  ASSESSMENT / PLAN:  PULMONARY A: Acute hypoxic respiratory failure secondary to pulmonary edema and possible superimposed pneumonia  Mechanical Intubation  R pleural effusion P:   Full vent support Maintain O2 sats >92% Scheduled bronchodilator therapy VAP Bundle   CARDIOVASCULAR A:  PEA arrest likely secondary to severe cardiomyopathy  Chronic AF - now with RVR Cardiogenic greater than Septic shock P:  Prn Vasopressin, Levophed, and Phenylephrine gtts as needed to maintain MAP goal >65 mmHg Continue amiodarone gtt  Trend troponin's   RENAL A:   AKI, oliguric Mild metabolic acidosis P:   BMET q4hrs while on CRRT ultrafiltration initiated 07/5 Monitor I/Os Correct electrolytes as indicated  GASTROINTESTINAL A:   Diarrhea P:   Protonix for PUD prophylaxis Continue TF's Occult stool pending  HEMATOLOGIC A:   Anemia Coagulopathy Elevated liver enzymes likely secondary to shock and hepatic congestion Thrombocytopenia Chronic anticoagulation with Pradaxa P:  Holding Pradaxa Monitor DIC panel and coags No chemical anticoagulation  Trend CBC Monitor for s/sx of bleeding  Will transfuse 1 unit pRBC's If occult positive will transfuse 1 unit of platelets   INFECTIOUS A:   Elevated PCT-improving  Severe sepsis Presumed LE cellulitis and possible superimposed pneumonia  P:   Monitor temp, WBC count Micro and abx as above Trend PCT's  ENDOCRINE A:   Hyperglycemia, resolved Episodic hypoglycemia P:   Monitor glu on chem panels Consider SSI for glu > 180  NEUROLOGIC A:   Mechanical Intubation  P:   RASS goal: 0 to -1 Fentanyl gtt to maintain RASS goal and for pain management  WUA daily   FAMILY  - Updates: Husband updated in detail regarding plan of care 04/12/17  -Will need to discuss goals of care with pts husband   [name], [place] Pager  [phone] (please enter 7 digits) PCCM Consult Pager [phone] (please enter 7 digits)    PCCM ATTENDING ATTESTATION: I have evaluated patient with the APP [place], reviewed database in its entirety and discussed care plan in detail. In addition, this patient was discussed on multidisciplinary rounds. The above note accurately reflects my findings impression and plan.  I think that most or all of this critical illness stems from her severe cardiomyopathy and now the aftermath of cardiac arrest. Her overall prognosis is extremely poor. I spoke with her husband in detail. He is quite distraught over the events of the past 24 hours. I explained to him the critical  nature of her illness and the guarded prognosis. I suggested that we continue full aggressive measures for the next few days but if we are not seeing substantial improvement by the first of next week, consider a transition to comfort measures.   Merton Border, MD PCCM service Mobile [phone] Pager [phone] 04/12/2017 3:58 PM

## 2017-04-12 NOTE — Progress Notes (Signed)
Chaplain visited with pt and family in room [place]. Pt was intubated and the husband was very upset. Pt had a hard time forgiving himself for some issues. Chaplain spoke about these issues with the pt. Chaplain provided [employer] of prayer and emotional and grief support.    04/12/17 0910  Clinical Encounter Type  Visited With Patient;Patient and family together  Visit Type Initial;Spiritual support  Referral From Nurse  Consult/Referral To Chaplain  Spiritual Encounters  Spiritual Needs Prayer;Emotional;Grief support

## 2017-04-12 NOTE — Plan of Care (Signed)
Problem: Safety: Goal: Ability to remain free from injury will improve Outcome: Progressing Pt will be injury free. Safe environment provided

## 2017-04-12 NOTE — Progress Notes (Signed)
[facility]  SUBJECTIVE: Patient intubated, awake, denies chest pain   Vitals:   04/12/17 0700 04/12/17 0707 04/12/17 0711 04/12/17 0730  BP: (!) 97/58   91/67  Pulse: (!) 25 73 (!) 141 (!) 25  Resp: 18 (!) 35 (!) 36 19  Temp: 98.1 F (36.7 C) 98.1 F (36.7 C) 98.1 F (36.7 C) 98.2 F (36.8 C)  TempSrc: Core (Comment)     SpO2: (!) 88% 96% 97% 91%  Weight:      Height:         Intake/Output Summary (Last 24 hours) at 04/12/17 0810 Last data filed at 04/12/17 0600  Gross per 24 hour  Intake          2156.02 ml  Output             1367 ml  Net           789.02 ml      PHYSICAL EXAM  General: Acutely ill-appearing, intubated on ventilator HEENT:  Normocephalic and atramatic Neck:  No JVD.  Lungs: Clear bilaterally to auscultation and percussion. Heart: HRRR . Normal S1 and S2 without gallops or murmurs.  Abdomen: Bowel sounds are positive, abdomen soft and non-tender  Msk:  Back normal, normal gait. Normal strength and tone for age. Extremities: No clubbing, cyanosis or edema.   Neuro: Alert and oriented X 3. Psych:  Good affect, responds appropriately   LABS: Basic Metabolic Panel:  Recent Labs  04/11/17 2353 04/12/17 0450  NA 143 140  K 4.2 4.0  CL 108 106  CO2 21* 21*  GLUCOSE 145* 126*  BUN 15 14  CREATININE 0.90 0.65  CALCIUM 8.0* 8.1*  MG 1.6* 2.2  PHOS 3.0 2.7   Liver Function Tests:  Recent Labs  04/11/17 1639  04/11/17 2353 04/12/17 0450  AST 1,174*  --   --  3,323*  ALT 823*  --   --  2,160*  ALKPHOS 214*  --   --  304*  BILITOT 3.3*  --   --  4.7*  PROT 4.6*  --   --  5.2*  ALBUMIN 3.1*  < > 3.7 3.5  < > = values in this interval not displayed. No results for input(s): LIPASE, AMYLASE in the last 72 hours. CBC:  Recent Labs  04/11/17 1639 04/11/17 2145 04/12/17 0450  WBC 10.2 13.0* 12.7*  NEUTROABS 8.0* 12.0*  --   HGB 6.8* 7.6* 7.1*  HCT 21.3* 23.2* 22.3*  MCV 96.7 96.7 96.4  PLT 31* 38* 37*   Cardiac Enzymes: No  results for input(s): CKTOTAL, CKMB, CKMBINDEX, TROPONINI in the last 72 hours. BNP: Invalid input(s): POCBNP D-Dimer: No results for input(s): DDIMER in the last 72 hours. Hemoglobin A1C: No results for input(s): HGBA1C in the last 72 hours. Fasting Lipid Panel: No results for input(s): CHOL, HDL, LDLCALC, TRIG, CHOLHDL, LDLDIRECT in the last 72 hours. Thyroid Function Tests: No results for input(s): TSH, T4TOTAL, T3FREE, THYROIDAB in the last 72 hours.  Invalid input(s): FREET3 Anemia Panel: No results for input(s): VITAMINB12, FOLATE, FERRITIN, TIBC, IRON, RETICCTPCT in the last 72 hours.  Dg Chest Port 1 View  Result Date: 04/12/2017 CLINICAL DATA:  Respiratory failure. EXAM: PORTABLE CHEST 1 VIEW COMPARISON:  04/11/2017. FINDINGS: Endotracheal tube, left IJ line, NG tube in stable position. Cardiomegaly with prominent diffuse bilateral pulmonary infiltrates/ edema again noted without interim change. Small right pleural effusion again noted. No pneumothorax. IMPRESSION: 1.  Lines and tubes stable position. 2. Cardiomegaly with  prominent diffuse bilateral pulmonary infiltrates/ edema and small right pleural effusion. No significant interim change. Electronically Signed   By: Marcello Moores  Register   On: 04/12/2017 06:30   Dg Chest Port 1 View  Result Date: 04/11/2017 CLINICAL DATA:  Post intubation and NG tube placement. EXAM: PORTABLE CHEST 1 VIEW COMPARISON:  04/10/2017 FINDINGS: 1733 hours. Endotracheal tube tip is 3.2 cm above the base of the carina. Left IJ central line tip overlies the distal SVC region. The NG tube passes into the stomach although the distal tip position is not included on the film. The cardio pericardial silhouette is enlarged. Interval progression of diffuse interstitial and bilateral airspace disease. Persistent small right pleural effusion. Defibrillator pads overlie the chest. IMPRESSION: 1. Support apparatus appears appropriately positioned. 2. Bilateral interstitial  and airspace disease compatible with diffuse pneumonia or asymmetric pulmonary edema. 3. Small right pleural effusion. Electronically Signed   By: Misty Stanley M.D.   On: 04/11/2017 17:44   Dg Abd Portable 1v  Result Date: 04/11/2017 CLINICAL DATA:  Intubation. EXAM: PORTABLE ABDOMEN - 1 VIEW COMPARISON:  CT from 04/04/2017 FINDINGS: Nasogastric tube with tip in the low stomach. The stomach is moderately gas distended. Rectal tube is also present. Right femoral catheter with tip at the L2-3 disc level. Amorphous density over the right iliac fossa is likely enteric. No calcifications seen in this area on recent CT. Questionable fold thickening of pelvic bowel loops. Bowel gas pattern is no overall nonobstructive. IMPRESSION: 1. Nasogastric tube tip is at the distal stomach. 2. Moderate gaseous distention of the stomach. 3. Right femoral venous catheter with tip at the L2-3 disc level. Electronically Signed   By: Monte Fantasia M.D.   On: 04/11/2017 18:07     Echo:  Dilated cardiomyopathy, LVEF 25-30%  TELEMETRY: Atrial fibrillation with a rapid ventricular rate:  ASSESSMENT AND PLAN:  Active Problems:   Septic shock ([place])    1. Dilated cardiomyopathy with LVEF 2530%, of unknown etiology, with probable acute on chronic systolic congestive heart failure 2. Borderline elevated troponin, in the absence of chest pain, very likely due to demand supply ischemia, not due to acute coronary syndrome 3. Atrial fibrillation with a rapid ventricular rate, on amiodarone, compensatory for septic shock syndrome 4. Left lower extremity cellulitis, septic shock, multiorgan failure including respiratory failure failure on ventilator, on multiple vasopressors, and on wide spectrum antibiotics 5. Acute on chronic renal failure, on CRRT, BUN and creatinine stabilized, poor urine output 6. Cardiopulmonary arrest, with transient bradycardia and hypoglycemia 7. Shocked liver, with markedly elevated LFTs 8. Poor  overall prognosis  Recommendations  1. Agree with current therapy 2. Continue amiodarone for rate control 3. May start heart failure medications once blood pressure stabilized   Isaias Cowman, MD, PhD, [facility] 04/12/2017 8:10 AM

## 2017-04-12 NOTE — Plan of Care (Signed)
Problem: Pain Managment: Goal: General experience of comfort will improve Outcome: Progressing Sedation titrated to maintain RASS goal. Patient less responsive as shift continued. At the beginning of the shift patient could follow commands and nod/shake head to questions but later in shift patient would make eye contact and react to voice but not follow commands. Patient keeps eyes open all the time and only appears to blink occasionally. She does close eyes during reaction to stimuli. Eyes gently flushed with saline several times during shift and lacrilube applied per orders in afternoon.  Problem: Physical Regulation: Goal: Ability to maintain clinical measurements within normal limits will improve Outcome: Progressing Levophed titrated off during shift and phenylephrine titrated to maintain blood pressure goals (vasopressin continued). One unit of PRBC's and one unit of platelets given per orders. Temperature maintained with CRRT in built fluid heater and bair hugger.   Problem: Fluid Volume: Goal: Ability to maintain a balanced intake and output will improve Outcome: Not Progressing No urine output during shift. CRRT maintained per nephrology orders. Transitioned from 2K fluid to 4K fluid.  Problem: Bowel/Gastric: Goal: Will not experience complications related to bowel motility Outcome: Not Progressing Patient still with liquid stool out of rectal tube. Dark brown color, hemoccult positive.

## 2017-04-12 NOTE — Progress Notes (Signed)
Initial Nutrition Assessment  DOCUMENTATION CODES:   Obesity unspecified  INTERVENTION:  1. Continue TF protocol, Vital High Protein @ 55mL/hr via OGT, Pro-stat 55mL BID Regimen provides 1160 calories,  114gm protein, 803cc free water  NUTRITION DIAGNOSIS:   Inadequate oral intake related to inability to eat as evidenced by NPO status (Intubated, sedated on ventilator).  GOAL:   Provide needs based on ASPEN/SCCM guidelines  MONITOR:   I & O's, Skin, TF tolerance, Vent status, Labs, Weight trends  REASON FOR ASSESSMENT:   Ventilator, Consult Enteral/tube feeding initiation and management  ASSESSMENT:   66 yo female with PMH of Chronic A-Fib, Hypothyroidism, presents with LE Edema, blistering lesions on BLE, progressive weakness, cardiogenic shock vs septic shock, Severe Sepsis, cellulitis, AKI. PEA arrested 7/4 w/ ROSC within 2 mins following ACLS protocol Acute Hypoxic resp failure 2/2 pulmonary edema, possible PNA, On 2 pressors - levo weaned On CRRT - on bearhugger to maintain temp  Intake/Output Summary (Last 24 hours) at 04/12/17 1443 Last data filed at 04/12/17 1200  Gross per 24 hour  Intake          2709.92 ml  Output              900 ml  Net          1809.92 ml   MAP 109 Access: Left IJ, R Fem HD Cath, OGT TF protocol initiated 7/5  Nutrition-Focused physical exam completed. Findings are no fat depletion, no muscle depletion, and severe edema (BLE).  Patient is currently intubated on ventilator support MV: 9.6 L/min Temp (24hrs), Avg:97.2 F (36.2 C), Min:93.9 F (34.4 C), Max:98.4 F (36.9 C) Propofol: none ml/hr Labs and medications reviewed: Vaso gtt, Neo gtt, Amiodarone gtt, fentanyl gtt  Diet Order:     Skin:  Wound (see comment) (Blisters to BLE)  Last BM:  04/12/2017 (Type 7)  Height:   Ht Readings from Last 1 Encounters:  04/09/17 5\' 5"  (1.651 m)    Weight:   Wt Readings from Last 1 Encounters:  04/12/17 197 lb 12 oz (89.7 kg)     Ideal Body Weight:  56.81 kg  BMI:  Body mass index is 32.91 kg/m.  Estimated Nutritional Needs:   Kcal:  987 - 1256 calories (ABW x 11-14)  Protein:  >/= 114 grams (IBW x2.0)  Fluid:  Per Nephrology  EDUCATION NEEDS:   Education needs no appropriate at this time  Satira Anis. Lavontay Kirk, MS, RD LDN Inpatient Clinical Dietitian Pager [phone]

## 2017-04-13 ENCOUNTER — Inpatient Hospital Stay: Payer: Medicare Other

## 2017-04-13 DIAGNOSIS — Z515 Encounter for palliative care: Secondary | ICD-10-CM

## 2017-04-13 DIAGNOSIS — I509 Heart failure, unspecified: Secondary | ICD-10-CM

## 2017-04-13 DIAGNOSIS — N179 Acute kidney failure, unspecified: Secondary | ICD-10-CM

## 2017-04-13 DIAGNOSIS — J9600 Acute respiratory failure, unspecified whether with hypoxia or hypercapnia: Secondary | ICD-10-CM

## 2017-04-13 DIAGNOSIS — Z7189 Other specified counseling: Secondary | ICD-10-CM

## 2017-04-13 LAB — RENAL FUNCTION PANEL
ALBUMIN: 3.4 g/dL — AB (ref 3.5–5.0)
ANION GAP: 12 (ref 5–15)
ANION GAP: 7 (ref 5–15)
Albumin: 3.4 g/dL — ABNORMAL LOW (ref 3.5–5.0)
Albumin: 3.3 g/dL — ABNORMAL LOW (ref 3.5–5.0)
Albumin: 3 g/dL — ABNORMAL LOW (ref 3.5–5.0)
Anion gap: 14 (ref 5–15)
Anion gap: 9 (ref 5–15)
BUN: 12 mg/dL (ref 6–20)
BUN: 14 mg/dL (ref 6–20)
BUN: 15 mg/dL (ref 6–20)
BUN: 12 mg/dL (ref 6–20)
CALCIUM: 8.4 mg/dL — AB (ref 8.9–10.3)
CALCIUM: 8.4 mg/dL — AB (ref 8.9–10.3)
CHLORIDE: 105 mmol/L (ref 101–111)
CO2: 20 mmol/L — ABNORMAL LOW (ref 22–32)
CO2: 21 mmol/L — ABNORMAL LOW (ref 22–32)
CO2: 24 mmol/L (ref 22–32)
CO2: 25 mmol/L (ref 22–32)
CREATININE: 0.65 mg/dL (ref 0.44–1.00)
CREATININE: 0.67 mg/dL (ref 0.44–1.00)
CREATININE: 0.6 mg/dL (ref 0.44–1.00)
Calcium: 8.1 mg/dL — ABNORMAL LOW (ref 8.9–10.3)
Calcium: 8.2 mg/dL — ABNORMAL LOW (ref 8.9–10.3)
Chloride: 106 mmol/L (ref 101–111)
Chloride: 106 mmol/L (ref 101–111)
Chloride: 106 mmol/L (ref 101–111)
Creatinine, Ser: 0.44 mg/dL (ref 0.44–1.00)
GFR calc Af Amer: >60 mL/min (ref >60)
GFR calc Af Amer: >60 mL/min (ref >60)
GFR calc non Af Amer: >60 mL/min (ref >60)
GFR calc non Af Amer: >60 mL/min (ref >60)
GLUCOSE: 155 mg/dL — AB (ref 65–99)
GLUCOSE: 118 mg/dL — AB (ref 65–99)
Glucose, Bld: 124 mg/dL — ABNORMAL HIGH (ref 65–99)
Glucose, Bld: 175 mg/dL — ABNORMAL HIGH (ref 65–99)
PHOSPHORUS: 2.1 mg/dL — AB (ref 2.5–4.6)
PHOSPHORUS: 1.9 mg/dL — AB (ref 2.5–4.6)
POTASSIUM: 3.7 mmol/L (ref 3.5–5.1)
POTASSIUM: 3.8 mmol/L (ref 3.5–5.1)
Phosphorus: 2 mg/dL — ABNORMAL LOW (ref 2.5–4.6)
Phosphorus: 1.4 mg/dL — ABNORMAL LOW (ref 2.5–4.6)
Potassium: 3.9 mmol/L (ref 3.5–5.1)
Potassium: 3.9 mmol/L (ref 3.5–5.1)
SODIUM: 139 mmol/L (ref 135–145)
SODIUM: 140 mmol/L (ref 135–145)
SODIUM: 139 mmol/L (ref 135–145)
Sodium: 137 mmol/L (ref 135–145)

## 2017-04-13 LAB — COMPREHENSIVE METABOLIC PANEL
ALBUMIN: 3.4 g/dL — AB (ref 3.5–5.0)
ALT: 2237 U/L — ABNORMAL HIGH (ref 14–54)
AST: 2736 U/L — AB (ref 15–41)
Alkaline Phosphatase: 395 U/L — ABNORMAL HIGH (ref 38–126)
Anion gap: 14 (ref 5–15)
BILIRUBIN TOTAL: 7 mg/dL — AB (ref 0.3–1.2)
BUN: 13 mg/dL (ref 6–20)
CHLORIDE: 105 mmol/L (ref 101–111)
CO2: 21 mmol/L — ABNORMAL LOW (ref 22–32)
CREATININE: 0.63 mg/dL (ref 0.44–1.00)
Calcium: 8.3 mg/dL — ABNORMAL LOW (ref 8.9–10.3)
GFR calc Af Amer: >60 mL/min (ref >60)
GLUCOSE: 112 mg/dL — AB (ref 65–99)
POTASSIUM: 3.9 mmol/L (ref 3.5–5.1)
Sodium: 140 mmol/L (ref 135–145)
Total Protein: 5.3 g/dL — ABNORMAL LOW (ref 6.5–8.1)

## 2017-04-13 LAB — GLUCOSE, CAPILLARY
Glucose-Capillary: 115 mg/dL — ABNORMAL HIGH (ref 65–99)
Glucose-Capillary: 117 mg/dL — ABNORMAL HIGH (ref 65–99)
Glucose-Capillary: 124 mg/dL — ABNORMAL HIGH (ref 65–99)
Glucose-Capillary: 131 mg/dL — ABNORMAL HIGH (ref 65–99)
Glucose-Capillary: 160 mg/dL — ABNORMAL HIGH (ref 65–99)
Glucose-Capillary: 173 mg/dL — ABNORMAL HIGH (ref 65–99)
Glucose-Capillary: 180 mg/dL — ABNORMAL HIGH (ref 65–99)

## 2017-04-13 LAB — TYPE AND SCREEN
ABO/RH(D): O POS
Antibody Screen: NEGATIVE
Unit division: 0

## 2017-04-13 LAB — CBC WITH DIFFERENTIAL/PLATELET
BASOS ABS: 0 10*3/uL (ref 0–0.1)
BASOS PCT: 0 %
EOS PCT: 0 %
Eosinophils Absolute: 0 10*3/uL (ref 0–0.7)
HEMATOCRIT: 25 % — AB (ref 35.0–47.0)
Hemoglobin: 8.1 g/dL — ABNORMAL LOW (ref 12.0–16.0)
LYMPHS ABS: 1.5 10*3/uL (ref 1.0–3.6)
Lymphocytes Relative: 8 %
MCH: 29.8 pg (ref 26.0–34.0)
MCHC: 32.5 g/dL (ref 32.0–36.0)
MCV: 91.6 fL (ref 80.0–100.0)
Monocytes Absolute: 0.2 10*3/uL (ref 0.2–0.9)
Monocytes Relative: 1 %
NEUTROS ABS: 17.3 10*3/uL — AB (ref 1.4–6.5)
NRBC: 5 /100{WBCs} — AB
Neutrophils Relative %: 91 %
Platelets: 81 10*3/uL — ABNORMAL LOW (ref 150–440)
RBC: 2.73 MIL/uL — ABNORMAL LOW (ref 3.80–5.20)
RDW: 19.2 % — AB (ref 11.5–14.5)
WBC: 19 10*3/uL — ABNORMAL HIGH (ref 3.6–11.0)

## 2017-04-13 LAB — CULTURE, BLOOD (ROUTINE X 2)
CULTURE: NO GROWTH
Culture: NO GROWTH
SPECIAL REQUESTS: ADEQUATE
Special Requests: ADEQUATE

## 2017-04-13 LAB — BPAM RBC
BLOOD PRODUCT EXPIRATION DATE: 201807112359
ISSUE DATE / TIME: 201807051230
UNIT TYPE AND RH: 9500

## 2017-04-13 LAB — MAGNESIUM
MAGNESIUM: 1.6 mg/dL — AB (ref 1.7–2.4)
MAGNESIUM: 1.7 mg/dL (ref 1.7–2.4)
MAGNESIUM: 1.8 mg/dL (ref 1.7–2.4)
MAGNESIUM: 1.8 mg/dL (ref 1.7–2.4)
Magnesium: 1.7 mg/dL (ref 1.7–2.4)
Magnesium: 2 mg/dL (ref 1.7–2.4)

## 2017-04-13 LAB — APTT: APTT: 51 s — AB (ref 24–36)

## 2017-04-13 LAB — PREPARE PLATELET PHERESIS: Unit division: 0

## 2017-04-13 LAB — BPAM PLATELET PHERESIS
Blood Product Expiration Date: 201807072359
ISSUE DATE / TIME: 201807051529
UNIT TYPE AND RH: 5100

## 2017-04-13 LAB — PROCALCITONIN: Procalcitonin: 12.33 ng/mL

## 2017-04-13 LAB — PHOSPHORUS: Phosphorus: 2.1 mg/dL — ABNORMAL LOW (ref 2.5–4.6)

## 2017-04-13 LAB — PATHOLOGIST SMEAR REVIEW

## 2017-04-13 MED ORDER — MAGNESIUM SULFATE 2 GM/50ML IV SOLN
2.0000 g | Freq: Once | INTRAVENOUS | Status: AC
  Administered 2017-04-13: 2 g via INTRAVENOUS
  Filled 2017-04-13: qty 50

## 2017-04-13 MED ORDER — POTASSIUM PHOSPHATES 45 MMOLE/15ML IV SOLN
20.0000 mmol | Freq: Once | INTRAVENOUS | Status: AC
  Administered 2017-04-13: 20 mmol via INTRAVENOUS
  Filled 2017-04-13: qty 6.67

## 2017-04-13 MED ORDER — VANCOMYCIN HCL 10 G IV SOLR
1500.0000 mg | Freq: Once | INTRAVENOUS | Status: AC
  Administered 2017-04-13: 1500 mg via INTRAVENOUS
  Filled 2017-04-13: qty 1500

## 2017-04-13 MED ORDER — AMIODARONE HCL IN DEXTROSE 360-4.14 MG/200ML-% IV SOLN: 30.0000 mg/h | INTRAVENOUS | Status: DC

## 2017-04-13 MED ORDER — AMIODARONE HCL IN DEXTROSE 360-4.14 MG/200ML-% IV SOLN
30.0000 mg/h | INTRAVENOUS | Status: DC
  Administered 2017-04-13 – 2017-04-18 (×11): 30 mg/h via INTRAVENOUS
  Administered 2017-04-19: 60 mg/h via INTRAVENOUS
  Administered 2017-04-20: 30 mg/h via INTRAVENOUS
  Administered 2017-04-20 (×2): 60 mg/h via INTRAVENOUS
  Administered 2017-04-21: 30 mg/h via INTRAVENOUS
  Filled 2017-04-13 (×17): qty 200

## 2017-04-13 MED ORDER — VANCOMYCIN HCL IN DEXTROSE 1-5 GM/200ML-% IV SOLN
1000.0000 mg | INTRAVENOUS | Status: DC
  Administered 2017-04-14 – 2017-04-16 (×3): 1000 mg via INTRAVENOUS
  Filled 2017-04-13 (×4): qty 200

## 2017-04-13 NOTE — Plan of Care (Signed)
Problem: Tissue Perfusion: Goal: Risk factors for ineffective tissue perfusion will decrease Outcome: Progressing Levophed and Neo stopped. Goal to maintain a MAP of 65. Will continue to monitor closely and adjust as needed.

## 2017-04-13 NOTE — Consult Note (Signed)
Consultation Note Date: 04/13/2017   Patient Name: [name]  DOB: 1951/03/08  MRN: [id number]  Age / Sex: 66 y.o., female  PCP: Tracie Harrier, MD Referring Physician: Baxter Hire, MD  Reason for Consultation: Establishing goals of care and Terminal Care  HPI/Patient Profile: 66 y.o. female  with past medical history of hypertension, hypothyroidism, atrial fibrillation, and anxiety admitted on 04/08/2017 with lower extremity edema and progressive weakness. Admitting diagnoses of septic shock, cellulitis, and acute kidney injury. Negative for DVT's. Echo revealed EF 25-30%. Nephrology and ID consultations. CRRT initiated on 04/09/17. On 04/11/17, patient with PEA arrest. ROSC in 2 minutes, intubated, and patient restarted on pressors. Now with cardiogenic shock, AKI remains on CRRT, elevated LFT's secondary to shock and hepatic congestion, thrombocytopenia, and worsening PCT likely due to LE cellulitis and superimposed pneumonia. Multiorgan failure with poor prognosis. Palliative medicine consult for goals of care.   Clinical Assessment and Goals of Care: I have reviewed medical records, discussed with care team, and met with husband and daughter at bedside to discuss diagnosis, poor prognosis, GOC, EOL wishes, disposition and options.  Introduced Palliative Medicine and support our team will provide during hospitalization.   We discussed a brief life review of the patient. Married to husband, Remo Lipps for 21 years. One step-daughter, Colletta Maryland. Patient retired from [employer] 5 years ago. Husband describes her as a very kind and humble individual. "Never says anything bad about anyone." Prior to hospitalization, patient living home with husband. She has been complaining of increased swelling in her lower extremities for over a month, to the point where she needed assistance with standing and  walking. Per husband, she stopped taking all of her home medications two months ago because disapproved of her new doctor and didn't schedule an appointment to get medications refilled. She had an appointment and ECHO scheduled with cardiology at the end of this month.      Discussed in detail hospital diagnoses and interventions. Husband and daughter very tearful throughout the conversation. After talking with multiple doctors, they have a good understanding of diagnoses and poor prognosis. "She is not leaving the hospital." The husband struggles with timing and withdrawal from care. He finds hope when she opens her eyes and squeezes his hand but is realistic in many factors that are contributing to poor prognosis.  I attempted to elicit values and goals of care important to the patient. Remo Lipps tells me she attempted to speak with him about advanced directives a few years back but he did not want to discuss this with her. He regrets this now. Remo Lipps also shares many other regrets including not finding her another doctor she liked or making her refill her medications. Also, not being at the hospital when she cardiac arrested because "she was begging me not to leave her that night."   Remo Lipps is most worried about "my life without her." Colletta Maryland speaks of her fears with him not coping in a healthy manner. Discussed bereavement support that is available to them.  At this point, Remo Lipps and Parker request continuing interventions through the weekend to allow family to visit. They do seem to understand she will not survive this hospitalization.   Therapeutic listening and emotional and spiritual support provided.     SUMMARY OF RECOMMENDATIONS    FULL code/FULL scope  Continue aggressive interventions through the weekend.   Family does seem to understand poor prognosis but struggle with timing with withdrawal of care.   PMT not at [facility] over the weekend but will f/u next week to further support  patient/family.  Code Status/Advance Care Planning:  Full code  Symptom Management:   Per attending  Palliative Prophylaxis:   Aspiration, Delirium Protocol, Frequent Pain Assessment, Oral Care and Turn Reposition  Additional Recommendations (Limitations, Scope, Preferences):  Full Scope Treatment  Psycho-social/Spiritual:   Desire for further Chaplaincy support:yes  Additional Recommendations: Caregiving  Support/Resources, Compassionate Wean Education and Grief/Bereavement Support  Prognosis:   Hours - Days: very poor prognosis likely hours when life prolonging interventions are discontinued  Discharge Planning: To Be Determined anticipate hospital death      Primary Diagnoses: Present on Admission: . Septic shock ([place])   I have reviewed the medical record, interviewed the patient and family, and examined the patient. The following aspects are pertinent.  Past Medical History:  Diagnosis Date  . A-fib ([place])    on pradaxa  . Hypertension   . Thyroid disease    Social History   Social History  . Marital status: Married    Spouse name: N/A  . Number of children: N/A  . Years of education: N/A   Social History Main Topics  . Smoking status: Never Smoker  . Smokeless tobacco: Never Used  . Alcohol use No  . Drug use: Unknown  . Sexual activity: Not Asked   Other Topics Concern  . None   Social History Narrative  . None   Family History  Problem Relation Age of Onset  . AAA (abdominal aortic aneurysm) Mother    Scheduled Meds: . chlorhexidine gluconate (MEDLINE KIT)  15 mL Mouth Rinse BID  . feeding supplement (PRO-STAT SUGAR FREE 64)  30 mL Per Tube BID  . feeding supplement (VITAL HIGH PROTEIN)  1,000 mL Per Tube Q24H  . ipratropium-albuterol  3 mL Nebulization Q6H  . levothyroxine  25 mcg Intravenous Daily  . mouth rinse  15 mL Mouth Rinse 10 times per day  . pantoprazole (PROTONIX) IV  40 mg Intravenous Q24H  . sertraline  25 mg Oral  Daily  . silver sulfADIAZINE   Topical BID  . tuberculin  5 Units Intradermal Once   Continuous Infusions: . amiodarone 30 mg/hr (04/13/17 0839)  . fentaNYL infusion INTRAVENOUS 25 mcg/hr (04/13/17 0520)  . magnesium sulfate 1 - 4 g bolus IVPB 2 g (04/13/17 1107)  . norepinephrine (LEVOPHED) Adult infusion Stopped (04/13/17 1045)  . phenylephrine (NEO-SYNEPHRINE) Adult infusion 80 mcg/min (04/13/17 1147)  . piperacillin-tazobactam (ZOSYN)  IV Stopped (04/13/17 0927)  . sodium phosphate  Dextrose 5% IVPB 20 mmol (04/13/17 1159)  . pureflow 2,000 mL/hr at 04/13/17 0837  . vasopressin (PITRESSIN) infusion - *FOR SHOCK* 0.03 Units/min (04/12/17 1814)   PRN Meds:.artificial tears, fentaNYL, heparin, metoprolol tartrate, [DISCONTINUED] ondansetron **OR** ondansetron (ZOFRAN) IV, sodium chloride flush, zinc oxide Medications Prior to Admission:  Prior to Admission medications   Medication Sig Start Date End Date Taking? Authorizing Provider  ALPRAZolam Duanne Moron) 0.25 MG tablet Take 0.25 mg by mouth at bedtime as needed for anxiety.  Yes [provider]  dabigatran (PRADAXA) 150 MG CAPS capsule Take 150 mg by mouth 2 (two) times daily.   Yes [provider]  diltiazem (TIAZAC) 180 MG 24 hr capsule Take 180 mg by mouth 2 (two) times daily.   Yes [provider]  furosemide (LASIX) 20 MG tablet Take 20 mg by mouth.   Yes [provider]  levothyroxine (SYNTHROID, LEVOTHROID) 50 MCG tablet Take 50 mcg by mouth daily before breakfast.   Yes [provider]  metoprolol tartrate (LOPRESSOR) 50 MG tablet Take 50 mg by mouth 2 (two) times daily.   Yes [provider]  sertraline (ZOLOFT) 25 MG tablet Take 25 mg by mouth daily.   Yes [provider]   No Known Allergies Review of Systems  Unable to perform ROS: Acuity of condition   Physical Exam  Constitutional: She is sedated and intubated.  HENT:  Head: Normocephalic and atraumatic.    Eyes: Scleral icterus is present.  Cardiovascular: An irregularly irregular rhythm present.  afib  Pulmonary/Chest: No tachypnea. She is intubated. No respiratory distress. She has decreased breath sounds.  Abdominal: Bowel sounds are decreased. There is no tenderness.  Musculoskeletal: She exhibits edema (generalized/weeping BLE).  Neurological: GCS eye subscore is 2. GCS verbal subscore is 1. GCS motor subscore is 4.  Skin:  jaundice  Nursing note and vitals reviewed.  Vital Signs: BP 90/63   Pulse (!) 101   Temp (!) 96.3 F (35.7 C)   Resp 16   Ht _0  (1.651 m)   Wt 87.9 kg (193 lb 12.6 oz)   SpO2 98%   BMI 32.25 kg/m  Pain Assessment: CPOT POSS *See Group Information*: 1-Acceptable,Awake and alert Pain Score: 0-No pain  SpO2: SpO2: 98 % O2 Device:SpO2: 98 % O2 Flow Rate: .O2 Flow Rate (L/min): 3 L/min  IO: Intake/output summary:   Intake/Output Summary (Last 24 hours) at 04/13/17 1203 Last data filed at 04/13/17 1200  Gross per 24 hour  Intake           5591.5 ml  Output             1264 ml  Net           4327.5 ml    LBM: Last BM Date: 04/12/17 Baseline Weight: Weight: 86.2 kg (190 lb) Most recent weight: Weight: 87.9 kg (193 lb 12.6 oz)     Palliative Assessment/Data: PPS 10%   Flowsheet Rows     Most Recent Value  Intake Tab  Referral Department  Critical care  Unit at Time of Referral  ICU  Palliative Care Primary Diagnosis  Other (Comment) [multiorgan failure]  Date Notified  04/13/17  Palliative Care Type  New Palliative care  Reason for referral  Clarify Goals of Care, End of Life Care Assistance  Date first seen by Palliative Care  04/13/17  # of days Palliative referral response time  0 Day(s)  Clinical Assessment  Palliative Performance Scale Score  10%  Psychosocial & Spiritual Assessment  Palliative Care Outcomes  Patient/Family meeting held?  Yes  Who was at the meeting?  patient, husband, daughter  Palliative Care Outcomes   Clarified goals of care, Provided end of life care assistance, Provided psychosocial or spiritual support      Time In: 1030 Time Out: 1200 Time Total: 28mn Greater than 50%  of this time was spent counseling and coordinating care related to the above assessment and plan.  Signed by:  MIhor Dow  FNP-C Palliative Medicine Team  Phone: [phone] Fax: [phone]   Please contact Palliative Medicine Team phone at [phone] for questions and concerns.  For individual provider: See Shea Evans

## 2017-04-13 NOTE — Progress Notes (Signed)
[facility] INFECTIOUS DISE[facility] PROGRESS NOTE Date of Admission:  04/08/2017     ID: [name] is a 66 y.o. female with sepsis  Active Problems:   Septic shock ([place])   Subjective: Remains intubated but is responsive and weaning pressor requirements  ROS  Unable to obtain  Medications:  Antibiotics Given (last 72 hours)    Date/Time Action Medication Dose Rate   04/10/17 1742 New Bag/Given   piperacillin-tazobactam (ZOSYN) IVPB 3.375 g 3.375 g 12.5 mL/hr   04/11/17 0229 New Bag/Given   piperacillin-tazobactam (ZOSYN) IVPB 3.375 g 3.375 g 12.5 mL/hr   04/11/17 1029 New Bag/Given   piperacillin-tazobactam (ZOSYN) IVPB 3.375 g 3.375 g 12.5 mL/hr   04/11/17 2208 New Bag/Given   piperacillin-tazobactam (ZOSYN) IVPB 3.375 g 3.375 g 12.5 mL/hr   04/12/17 0600 New Bag/Given   piperacillin-tazobactam (ZOSYN) IVPB 3.375 g 3.375 g 12.5 mL/hr   04/12/17 1340 New Bag/Given   piperacillin-tazobactam (ZOSYN) IVPB 3.375 g 3.375 g 12.5 mL/hr   04/12/17 2215 New Bag/Given   piperacillin-tazobactam (ZOSYN) IVPB 3.375 g 3.375 g 12.5 mL/hr   04/13/17 0527 New Bag/Given   piperacillin-tazobactam (ZOSYN) IVPB 3.375 g 3.375 g 12.5 mL/hr   04/13/17 1447 New Bag/Given   piperacillin-tazobactam (ZOSYN) IVPB 3.375 g 3.375 g 12.5 mL/hr     . chlorhexidine gluconate (MEDLINE KIT)  15 mL Mouth Rinse BID  . feeding supplement (PRO-STAT Shannon FREE 64)  30 mL Per Tube BID  . feeding supplement (VITAL HIGH PROTEIN)  1,000 mL Per Tube Q24H  . ipratropium-albuterol  3 mL Nebulization Q6H  . levothyroxine  25 mcg Intravenous Daily  . mouth rinse  15 mL Mouth Rinse 10 times per day  . pantoprazole (PROTONIX) IV  40 mg Intravenous Q24H  . sertraline  25 mg Oral Daily  . silver sulfADIAZINE   Topical BID    Objective: Vital signs in last 24 hours: Temp:  [95.9 F (35.5 C)-98.2 F (36.8 C)] 97.3 F (36.3 C) (07/06 1445) Pulse Rate:  [25-124] 106 (07/06 1500) Resp:  [13-27] 17 (07/06 1500) BP:  (59-120)/(21-97) 92/76 (07/06 1500) SpO2:  [77 %-100 %] 98 % (07/06 1512) FiO2 (%):  [40 %-60 %] 40 % (07/06 1512) Weight:  [87.9 kg (193 lb 12.6 oz)] 87.9 kg (193 lb 12.6 oz) (07/06 0431) Constitutional:  intubated, ill appearing HENT: [place]/AT, PERRLA, no scleral icterus, Mouth/Throat: ETT Cardiovascular:Tachy, 2/6 sm Pulmonary/Chest: dec bs bil bases Neck = supple, no nuchal rigidity Abdominal: Soft. Bowel sounds are normal.  exhibits no distension. There is no tenderness. Rectal tube Lymphadenopathy: no cervical adenopathy. No axillary adenopathy Neurological: intubated and sedated HD cath RLE  Ext 3+ edema bil le Skin: bruising on L inner thigh, Bil LE wrapped but Dr Alva Garnet and RN describe ruptured bullae Psychiatric: unable to obtain   Lab Results  Recent Labs  04/12/17 1852  04/13/17 0753 04/13/17 0830 04/13/17 1335  WBC 14.1*  --  19.0*  --   --   HGB 7.9*  --  8.1*  --   --   HCT 24.3*  --  25.0*  --   --   NA  --   < >  --  139 139  K  --   < >  --  3.9 3.9  CL  --   < >  --  106 106  CO2  --   < >  --  21* 24  BUN  --   < >  --  12  14  CREATININE  --   < >  --  0.60 0.65  < > = values in this interval not displayed.  Microbiology: Results for orders placed or performed during the hospital encounter of 04/08/17  Culture, blood (routine x 2)     Status: None   Collection Time: 04/08/17  2:04 AM  Result Value Ref Range Status   Specimen Description BLOOD RIGHT ANTECUBITAL  Final   Special Requests   Final    BOTTLES DRAWN AEROBIC AND ANAEROBIC Blood Culture adequate volume   Culture NO GROWTH 5 DAYS  Final   Report Status 04/13/2017 FINAL  Final  Culture, blood (routine x 2)     Status: None   Collection Time: 04/08/17  2:06 AM  Result Value Ref Range Status   Specimen Description BLOOD LEFT ANTECUBITAL  Final   Special Requests   Final    BOTTLES DRAWN AEROBIC AND ANAEROBIC Blood Culture adequate volume   Culture NO GROWTH 5 DAYS  Final   Report Status  04/13/2017 FINAL  Final  C difficile quick scan w PCR reflex     Status: None   Collection Time: 04/08/17  5:09 AM  Result Value Ref Range Status   C Diff antigen NEGATIVE NEGATIVE Final   C Diff toxin NEGATIVE NEGATIVE Final   C Diff interpretation No C. difficile detected.  Final  MRSA PCR Screening     Status: None   Collection Time: 04/08/17  6:50 AM  Result Value Ref Range Status   MRSA by PCR NEGATIVE NEGATIVE Final    Comment:        The GeneXpert MRSA Assay (FDA approved for NASAL specimens only), is one component of a comprehensive MRSA colonization surveillance program. It is not intended to diagnose MRSA infection nor to guide or monitor treatment for MRSA infections.   Urine culture     Status: None   Collection Time: 04/08/17 12:16 PM  Result Value Ref Range Status   Specimen Description URINE, RANDOM  Final   Special Requests NONE  Final   Culture   Final    NO GROWTH Performed at [facility], [address], [place], [place] [address]    Report Status 04/10/2017 FINAL  Final  Aerobic Culture (superficial specimen)     Status: None   Collection Time: 04/09/17 11:32 AM  Result Value Ref Range Status   Specimen Description SKIN  Final   Special Requests NONE  Final   Gram Stain   Final    RARE WBC PRESENT, PREDOMINANTLY PMN NO SQUAMOUS EPITHELIAL CELLS SEEN NO ORGANISMS SEEN    Culture   Final    NO GROWTH 2 DAYS Performed at [facility], [place] [address], [place], [place] [address]    Report Status 04/11/2017 FINAL  Final  Gastrointestinal Panel by PCR , Stool     Status: None   Collection Time: 04/09/17  5:10 PM  Result Value Ref Range Status   Campylobacter species NOT DETECTED NOT DETECTED Final   Plesimonas shigelloides NOT DETECTED NOT DETECTED Final   Salmonella species NOT DETECTED NOT DETECTED Final   Yersinia enterocolitica NOT DETECTED NOT DETECTED Final   Vibrio species NOT DETECTED NOT DETECTED Final   Vibrio cholerae NOT  DETECTED NOT DETECTED Final   Enteroaggregative E coli (EAEC) NOT DETECTED NOT DETECTED Final   Enteropathogenic E coli (EPEC) NOT DETECTED NOT DETECTED Final   Enterotoxigenic E coli (ETEC) NOT DETECTED NOT DETECTED Final  Shiga like toxin producing E coli (STEC) NOT DETECTED NOT DETECTED Final   Shigella/Enteroinvasive E coli (EIEC) NOT DETECTED NOT DETECTED Final   Cryptosporidium NOT DETECTED NOT DETECTED Final   Cyclospora cayetanensis NOT DETECTED NOT DETECTED Final   Entamoeba histolytica NOT DETECTED NOT DETECTED Final   Giardia lamblia NOT DETECTED NOT DETECTED Final   Adenovirus F40/41 NOT DETECTED NOT DETECTED Final   Astrovirus NOT DETECTED NOT DETECTED Final   Norovirus GI/GII NOT DETECTED NOT DETECTED Final   Rotavirus A NOT DETECTED NOT DETECTED Final   Sapovirus (I, II, IV, and V) NOT DETECTED NOT DETECTED Final  Culture, respiratory (NON-Expectorated)     Status: None (Preliminary result)   Collection Time: 04/12/17 12:00 PM  Result Value Ref Range Status   Specimen Description TRACHEAL ASPIRATE  Final   Special Requests NONE  Final   Gram Stain   Final    MODERATE WBC PRESENT,BOTH PMN AND MONONUCLEAR MODERATE YEAST Performed at [facility], [address], [place], [place] [address]    Culture MODERATE CANDIDA TROPICALIS  Final   Report Status PENDING  Incomplete    Studies/Results: Dg Chest Port 1 View  Result Date: 04/13/2017 CLINICAL DATA:  Acute respiratory failure. EXAM: PORTABLE CHEST 1 VIEW COMPARISON:  04/12/2017 FINDINGS: Support apparatus is stable. The cardiac silhouette is enlarged. Mediastinal contours appear intact. There is no evidence of pneumothorax. Mixed pattern pulmonary edema versus pulmonary infiltrates have slightly improved. There is a stable right pleural effusion. Possible small left pleural effusion. Osseous structures are without acute abnormality. Soft tissues are grossly normal. IMPRESSION: Stable support apparatus. Slight  improvement in mixed pattern pulmonary edema versus infiltrates in bilateral lungs. Probable bilateral pleural effusions. Electronically Signed   By: Fidela Salisbury M.D.   On: 04/13/2017 10:08   Dg Chest Port 1 View  Result Date: 04/12/2017 CLINICAL DATA:  Respiratory failure. EXAM: PORTABLE CHEST 1 VIEW COMPARISON:  04/11/2017. FINDINGS: Endotracheal tube, left IJ line, NG tube in stable position. Cardiomegaly with prominent diffuse bilateral pulmonary infiltrates/ edema again noted without interim change. Small right pleural effusion again noted. No pneumothorax. IMPRESSION: 1.  Lines and tubes stable position. 2. Cardiomegaly with prominent diffuse bilateral pulmonary infiltrates/ edema and small right pleural effusion. No significant interim change. Electronically Signed   By: Marcello Moores  Register   On: 04/12/2017 06:30   Dg Chest Port 1 View  Result Date: 04/11/2017 CLINICAL DATA:  Post intubation and NG tube placement. EXAM: PORTABLE CHEST 1 VIEW COMPARISON:  04/10/2017 FINDINGS: 1733 hours. Endotracheal tube tip is 3.2 cm above the base of the carina. Left IJ central line tip overlies the distal SVC region. The NG tube passes into the stomach although the distal tip position is not included on the film. The cardio pericardial silhouette is enlarged. Interval progression of diffuse interstitial and bilateral airspace disease. Persistent small right pleural effusion. Defibrillator pads overlie the chest. IMPRESSION: 1. Support apparatus appears appropriately positioned. 2. Bilateral interstitial and airspace disease compatible with diffuse pneumonia or asymmetric pulmonary edema. 3. Small right pleural effusion. Electronically Signed   By: Misty Stanley M.D.   On: 04/11/2017 17:44   Dg Abd Portable 1v  Result Date: 04/11/2017 CLINICAL DATA:  Intubation. EXAM: PORTABLE ABDOMEN - 1 VIEW COMPARISON:  CT from 04/04/2017 FINDINGS: Nasogastric tube with tip in the low stomach. The stomach is moderately gas  distended. Rectal tube is also present. Right femoral catheter with tip at the L2-3 disc level. Amorphous density over the right  iliac fossa is likely enteric. No calcifications seen in this area on recent CT. Questionable fold thickening of pelvic bowel loops. Bowel gas pattern is no overall nonobstructive. IMPRESSION: 1. Nasogastric tube tip is at the distal stomach. 2. Moderate gaseous distention of the stomach. 3. Right femoral venous catheter with tip at the L2-3 disc level. Electronically Signed   By: Monte Fantasia M.D.   On: 04/11/2017 18:07   Study Conclusions  - Left ventricle: The cavity size was mildly dilated. There was   mild concentric hypertrophy. Systolic function was severely   reduced. The estimated ejection fraction was in the range of 25%   to 30%. - Aortic valve: There was moderate regurgitation. - Mitral valve: There was mild regurgitation. - Left atrium: The atrium was mildly dilated. - Right atrium: The atrium was mildly dilated. - Tricuspid valve: There was moderate regurgitation.  Assessment/Plan: [name] is a 66 y.o. female admitted with 1-2 months of progressive LE edema, progressive to bullae and cellulitis, hypotension as well as bil pleural effusions. Labs are significant for coagulopathy, TCP,  elevated BNP, acute renal failure, low albumin, elevated lactate, proteinuria, marked elevated procalcitonin.  WBC only mildly elevated, no fevers. [place] neg.   Her strep PNA urinary ag is also + but bcx pending. CT scan done a few days prior to admit shows effusion, possible R consolidation, gallstones and subq edema on Abd wall.   She certainly has LE cellulitis  but this is related to the edema and not the primary underlying etiology. Gram stain is neg on fluid from bullae. Also likely PNA based on CXR and urinary ag + S pna.  Echo shows severe systolic CHF, Mod AR, Mod TR.   She has not unusual exposures, no tick exposures. Endocarditis is a possibility but echo and  bcx neg so far.  BCX ucx neg. HIV neg, ESR 21. C diff and stool PCR neg.  She has coded and is now intubated and on 3 pressors. No obvious etiology of the renal failure and CHF.   LFTS very high from shock liver.  HGB dropped impressively. Stool heme +.  Procalcitonin decreasing.  She is very ill and has poor prognosis.  WBC increased Recommendations continue zosyn  Add vanco given increased wbc Monitor for C diff Continue renal wu and volume control. Continue wu for Underlying etiology.  CBC smear neg for MAHA Thank you very much for the consult. Will follow with you.  Isbella Arline P   04/13/2017, 3:28 PM

## 2017-04-13 NOTE — Progress Notes (Signed)
Chaplain visited with pt and family in room [place]. Pt was intubated and the husband was very upset. Pt had a hard time forgiving himself for some issues. Chaplain provided emotional and grief support.    04/13/17 1001  Clinical Encounter Type  Visited With Patient;Patient and family together  Visit Type Follow-up;Spiritual support  Referral From Nurse  Consult/Referral To Chaplain  Spiritual Encounters  Spiritual Needs Emotional;Grief support

## 2017-04-13 NOTE — Progress Notes (Signed)
Pharmacy Antibiotic Note/CRRT Dose Adjustment  [name] is a 66 y.o. female admitted on 04/08/2017 with progressive edema with bullae and cellulitis and anasarca.  Pharmacy has been consulted for Zosyn dosing. Patient is currently requiring CRRT.   Plan: 1. Continue Zosyn 3.375 g EI q 8 hours.   Will resume vancomycin per ID recommendation at 1500 mg iv once then 1000 mg iv q 24 hours. Will check a level prior to the third dose with a goal of 15-20 mcg/ml.    2. No medications require adjustment for CRRT at present. Will continue to monitor.   Height: 5\' 5"  (165.1 cm) Weight: 193 lb 12.6 oz (87.9 kg) IBW/kg (Calculated) : 57  Temp (24hrs), Avg:97.1 F (36.2 C), Min:95.9 F (35.5 C), Max:98.2 F (36.8 C)   Recent Labs Lab 04/08/17 0204 04/08/17 0509  04/09/17 0003 04/09/17 0423 04/09/17 1710  04/10/17 0533  04/11/17 1639 04/11/17 2145  04/12/17 0450  04/12/17 1852 04/12/17 2000 04/12/17 2354 04/13/17 0427 04/13/17 0753 04/13/17 0830 04/13/17 1335  WBC  --   --   --  12.9* 13.1*  --   --   --   --  10.2 13.0*  --  12.7*  --  14.1*  --   --   --  19.0*  --   --   CREATININE  --   --   < > 2.18* 2.01* 2.07*  2.28*  < >  --   < > 1.06* 0.96  0.96  < > 0.65  < >  --  0.73 0.67 0.63  --  0.60 0.65  LATICACIDVEN 5.2* 4.5*  --  4.3* 5.0*  --   --  2.7*  --   --   --   --   --   --   --   --   --   --   --   --   --   VANCORANDOM  --   --   --   --   --  18  --   --   --   --   --   --   --   --   --   --   --   --   --   --   --   < > = values in this interval not displayed.  Estimated Creatinine Clearance: 76.8 mL/min (by C-G formula based on SCr of 0.65 mg/dL).    No Known Allergies  Antimicrobials this admission: Vancomycin 7/1 >> 7/2 Zosyn 7/1 >> 7/2, 7/3 >>  Dose adjustments this admission:  Microbiology results: Wound Cx: NGTD UCx: NG BCx: NGTD GI PCR: negative C diff: negative MRSA PCR: negative  Thank you for allowing pharmacy to be a part of this  patient's care.  Napoleon Form 04/13/2017 2:47 PM

## 2017-04-13 NOTE — Progress Notes (Signed)
Subjective: Patient FOR vent and pressor support. As with the bedside.  Objective: Vital signs in last 24 hours: Temp:  [95.9 F (35.5 C)-98.2 F (36.8 C)] 96.3 F (35.7 C) (07/06 1045) Pulse Rate:  [25-135] 110 (07/06 1330) Resp:  [14-27] 18 (07/06 1330) BP: (59-120)/(21-97) 85/62 (07/06 1330) SpO2:  [77 %-100 %] 100 % (07/06 1330) FiO2 (%):  [40 %-60 %] 40 % (07/06 1114) Weight:  [87.9 kg (193 lb 12.6 oz)] 87.9 kg (193 lb 12.6 oz) (07/06 0431) Weight change: -1.8 kg (-3 lb 15.5 oz) Last BM Date: 04/12/17  Intake/Output from previous day: 07/05 0701 - 07/06 0700 In: 6513.3 [I.V.:5037.3; Blood:490; NG/GT:886; IV Piggyback:100] Out: 7353 [Stool:225] Intake/Output this shift: Total I/O In: -  Out: 195 [Other:195]  General appearance: Unresponsive Resp: Coarse scattered rhonchi Cardio: irregularly irregular rhythm  Lab Results:  Recent Labs  04/12/17 1852 04/13/17 0753  WBC 14.1* 19.0*  HGB 7.9* 8.1*  HCT 24.3* 25.0*  PLT 103* 81*   BMET  Recent Labs  04/13/17 0830 04/13/17 1335  NA 139 139  K 3.9 3.9  CL 106 106  CO2 21* 24  GLUCOSE 124* 175*  BUN 12 14  CREATININE 0.60 0.65  CALCIUM 8.4* 8.4*    Studies/Results: Dg Chest Port 1 View  Result Date: 04/13/2017 CLINICAL DATA:  Acute respiratory failure. EXAM: PORTABLE CHEST 1 VIEW COMPARISON:  04/12/2017 FINDINGS: Support apparatus is stable. The cardiac silhouette is enlarged. Mediastinal contours appear intact. There is no evidence of pneumothorax. Mixed pattern pulmonary edema versus pulmonary infiltrates have slightly improved. There is a stable right pleural effusion. Possible small left pleural effusion. Osseous structures are without acute abnormality. Soft tissues are grossly normal. IMPRESSION: Stable support apparatus. Slight improvement in mixed pattern pulmonary edema versus infiltrates in bilateral lungs. Probable bilateral pleural effusions. Electronically Signed   By: Fidela Salisbury M.D.    On: 04/13/2017 10:08   Dg Chest Port 1 View  Result Date: 04/12/2017 CLINICAL DATA:  Respiratory failure. EXAM: PORTABLE CHEST 1 VIEW COMPARISON:  04/11/2017. FINDINGS: Endotracheal tube, left IJ line, NG tube in stable position. Cardiomegaly with prominent diffuse bilateral pulmonary infiltrates/ edema again noted without interim change. Small right pleural effusion again noted. No pneumothorax. IMPRESSION: 1.  Lines and tubes stable position. 2. Cardiomegaly with prominent diffuse bilateral pulmonary infiltrates/ edema and small right pleural effusion. No significant interim change. Electronically Signed   By: Marcello Moores  Register   On: 04/12/2017 06:30   Dg Chest Port 1 View  Result Date: 04/11/2017 CLINICAL DATA:  Post intubation and NG tube placement. EXAM: PORTABLE CHEST 1 VIEW COMPARISON:  04/10/2017 FINDINGS: 1733 hours. Endotracheal tube tip is 3.2 cm above the base of the carina. Left IJ central line tip overlies the distal SVC region. The NG tube passes into the stomach although the distal tip position is not included on the film. The cardio pericardial silhouette is enlarged. Interval progression of diffuse interstitial and bilateral airspace disease. Persistent small right pleural effusion. Defibrillator pads overlie the chest. IMPRESSION: 1. Support apparatus appears appropriately positioned. 2. Bilateral interstitial and airspace disease compatible with diffuse pneumonia or asymmetric pulmonary edema. 3. Small right pleural effusion. Electronically Signed   By: Misty Stanley M.D.   On: 04/11/2017 17:44   Dg Abd Portable 1v  Result Date: 04/11/2017 CLINICAL DATA:  Intubation. EXAM: PORTABLE ABDOMEN - 1 VIEW COMPARISON:  CT from 04/04/2017 FINDINGS: Nasogastric tube with tip in the low stomach. The stomach is moderately gas distended. Rectal tube  is also present. Right femoral catheter with tip at the L2-3 disc level. Amorphous density over the right iliac fossa is likely enteric. No calcifications  seen in this area on recent CT. Questionable fold thickening of pelvic bowel loops. Bowel gas pattern is no overall nonobstructive. IMPRESSION: 1. Nasogastric tube tip is at the distal stomach. 2. Moderate gaseous distention of the stomach. 3. Right femoral venous catheter with tip at the L2-3 disc level. Electronically Signed   By: Monte Fantasia M.D.   On: 04/11/2017 18:07    Medications: I have reviewed the patient's current medications.  Assessment/Plan: 1. Status post cardiopulmonary arrest. Patient now requiring supportive care with ventilator and multiple pressors. No improvement today 2. Sepsiswith septic shock. Likely secondary to lower extremity wound infections.also may be pulmonary source also has her strep urinary antigen is positive. Now on 3 pressors. 3. Lower extremitycellulitis. Patient is on Zosyn . Cultures unrevealing so far . 4. Acute renal failure. Patient likely has nephrotic syndrome. dialysis as needed 5. Lower extremity edema. Not sure what precipitated this.Perhaps nephrotic syndrome. Echocardiogram pending. Multiple serologies have been ordered to further assess.  6. Atrial fibrillation. Now on amiodarone for rate control. 7. Coagulopathy. Suspect this could be DIC. 8. Cardiomyopathy. Echocardiogram revealed EF of 25%. Etiology unclear.  9. Shock liver. LFTs are extremely elevated. Likely secondary to hypoperfusion during the code. Continue supportive care and monitor liver enzymes 10. Prognosis. Extremely poor. Husband and family likely to make end-of-life decision later today. Palliative care involved.  Total time 20 minutes  LOS: 5 days   Baxter Hire 04/13/2017, 2:48 PM

## 2017-04-13 NOTE — Progress Notes (Signed)
PULMONARY / CRITICAL CARE MEDICINE   Name: [name] MRN: [id number] DOB: 1951-05-29    ADMISSION DATE:  04/08/2017  PT PROFILE:   66 y.o. F with PMH of CAF, hypothyroidism admitted via ED to ICU with many days of progressive LE edema, blistering lesions on BLE, progressive weakness. Adm diagnosis of septic shock, cellulitis, AKI.  Pt PEA arrested 07/4 ACLS protocol initiated ROSC within 2 minutes following interventions.   MAJOR EVENTS/TEST RESULTS: 07/01 LE venous US: no DVT 07/01 Echocardiogram:  LVEF 25-30% 07/01 Nephrology consultation 07/02 ID consultation 07/02 CRRT initiated 07/03 Off vasopressors. Worsening tachycardia - amiodarone initiated 07/04 Pt PEA arrest ACLS protocol initiated ROSC 2 minutes now back on vasopressors  07/05 Intubated, shock req high dose vasopressors, on CRRT, elevated LFTs c/w congestion/shock liver, pulmonary edema vs ARDS on CXR  INDWELLING DEVICES:: R fem HD cath 07/02 >>  ETT 07/04 >>  Left IJ 07/04 >>   MICRO DATA: MRSA PCR 07/01 >> NEG Urine 07/01 >> NEG Blood 07/01 >> NEG C diff 07/01 >> NEG GI panel 07/02 >> NEG Resp 07/03 >>   ANTIMICROBIALS:  Vanc 07/01 >> 07/02 Pip-tazo 07/01 >>   SUBJECTIVE:  Pt remains intubated requiring multiple vasopressors.  Per RN no acute episodes overnight.  VITAL SIGNS: BP 106/60   Pulse (!) 30   Temp (!) 96.6 F (35.9 C)   Resp 14   Ht 5\' 5"  (1.651 m)   Wt 87.9 kg (193 lb 12.6 oz)   SpO2 99%   BMI 32.25 kg/m   HEMODYNAMICS:    VENTILATOR SETTINGS: Vent Mode: PRVC FiO2 (%):  [50 %-60 %] 50 % Set Rate:  [16 bmp] 16 bmp Vt Set:  [500 mL] 500 mL PEEP:  [5 cmH20] 5 cmH20 Plateau Pressure:  [11 cmH20] 11 cmH20  INTAKE / OUTPUT: I/O last 3 completed shifts: In: 9068 [I.V.:7093.3; Blood:490; NG/GT:1234.7; IV Piggyback:250] Out: 1869 [Urine:2; PXTGG:2694; Stool:225]  PHYSICAL EXAMINATION: General: acutely ill appearing Caucasian female, mechanically intubated  Neuro: sedated, able to  nod head yes and no appropriately, PERRL  HEENT: supple, no JVD jaundice bilateral pupils Cardiovascular: IRIR, tachy, no M/R/G Lungs: crackles throughout, even, non labored; no wheezes   Abdomen: obese, soft, ND Ext: severe BLE edema from hips down, multiple bilateral bullous lesions  LABS:  BMET  Recent Labs Lab 04/12/17 2354 04/13/17 0427 04/13/17 0830  NA 140 140 139  K 3.8 3.9 3.9  CL 106 105 106  CO2 20* 21* 21*  BUN 12 13 12   CREATININE 0.67 0.63 0.60  GLUCOSE 118* 112* 124*    Electrolytes  Recent Labs Lab 04/12/17 2000 04/12/17 2354 04/13/17 0427 04/13/17 0830  CALCIUM 8.3* 8.1* 8.3* 8.4*  MG 1.8 1.7 1.7  --   PHOS 2.4* 2.1* 2.1* 1.9*    CBC  Recent Labs Lab 04/11/17 2145 04/12/17 0450 04/12/17 1852  WBC 13.0* 12.7* 14.1*  HGB 7.6* 7.1* 7.9*  HCT 23.2* 22.3* 24.3*  PLT 38* 37* 103*    Coag's  Recent Labs Lab 04/09/17 0003 04/10/17 0534  04/11/17 1847 04/12/17 0450 04/13/17 0427  APTT 113* 107*  < > 141* 74* 51*  INR 5.67* 2.65  --  4.27*  --   --   < > = values in this interval not displayed.  Sepsis Markers  Recent Labs Lab 04/09/17 0003 04/09/17 0423  04/10/17 0533  04/11/17 0458 04/12/17 0732 04/13/17 0427  LATICACIDVEN 4.3* 5.0*  --  2.7*  --   --   --   --  PROCALCITON  --   --   < >  --   < > 20.01 11.38 12.33  < > = values in this interval not displayed.  ABG  Recent Labs Lab 04/11/17 1730 04/12/17 0500 04/12/17 0935  PHART 7.21* 7.29* 7.33*  PCO2ART 50* 35 37  PO2ART 55* 91 86    Liver Enzymes  Recent Labs Lab 04/11/17 1639  04/12/17 0450  04/12/17 2354 04/13/17 0427 04/13/17 0830  AST 1,174*  --  3,323*  --   --  2,736*  --   ALT 823*  --  2,160*  --   --  2,237*  --   ALKPHOS 214*  --  304*  --   --  395*  --   BILITOT 3.3*  --  4.7*  --   --  7.0*  --   ALBUMIN 3.1*  < > 3.5  < > 3.4* 3.4* 3.4*  < > = values in this interval not displayed.  Cardiac Enzymes  Recent Labs Lab 04/08/17 0139  04/12/17 0732 04/12/17 1217  TROPONINI 0.05* 0.09* 0.08*    Glucose  Recent Labs Lab 04/12/17 1556 04/12/17 1842 04/12/17 2020 04/13/17 0011 04/13/17 0444 04/13/17 0737  GLUCAP 119* 75 144* 117* 124* 115*    CXR: NSC moderate R pleural effusion  ASSESSMENT / PLAN:  PULMONARY A: Acute hypoxic respiratory failure secondary to pulmonary edema and possible superimposed pneumonia  Mechanical Intubation  R pleural effusion P:   Full vent support Maintain O2 sats >92% Scheduled bronchodilator therapy VAP Bundle   CARDIOVASCULAR A:  PEA arrest likely secondary to severe cardiomyopathy  Chronic AF - now with RVR Cardiogenic greater than Septic shock P:  Prn Vasopressin, Levophed, and Phenylephrine gtts as needed to maintain MAP goal >65 mmHg Continue amiodarone gtt  Trend troponin's   RENAL A:   AKI, oliguric Mild metabolic acidosis P:   BMET q4hrs while on CRRT ultrafiltration initiated 07/5 Monitor I/Os Correct electrolytes as indicated  GASTROINTESTINAL A:   Diarrhea-occult positive  P:   Protonix for PUD prophylaxis Continue TF's  HEMATOLOGIC A:   Anemia Coagulopathy Elevated liver enzymes likely secondary to shock and hepatic congestion Thrombocytopenia Chronic anticoagulation with Pradaxa P:  Holding Pradaxa Monitor DIC panel and coags No chemical anticoagulation  Trend CBC Monitor for s/sx of bleeding   INFECTIOUS A:   Elevated PCT-worsening   Severe sepsis Presumed LE cellulitis and possible superimposed pneumonia  P:   Monitor temp, WBC count Micro and abx as above Trend PCT's  ENDOCRINE A:   Hyperglycemia, resolved Episodic hypoglycemia P:   Monitor glu on chem panels Consider SSI for glu > 180  NEUROLOGIC A:   Mechanical Intubation  P:   RASS goal: 0 to -1 Fentanyl gtt to maintain RASS goal and for pain management  WUA daily   FAMILY  - Updates: Husband updated in detail regarding plan of care  04/13/17  -Currently discussing goals of treatment and poor prognosis with pts husband he is currently contacting more family members to assist him with deciding goals of care.  [name], [name] Pager [phone] (please enter 7 digits) PCCM Consult Pager [phone] (please enter 7 digits)  Pulmonary/critical care attending  I personally seen and examined [name], reviewed revise and confirm nurse practitioner's note I personally reviewed laboratory studies and imaging studies. Patient with severe multisystem organ failure, respiratory failure, renal failure on CRRT, multiple pressors, DIC. Patient with poor prognosis. Pending further family discussion regarding goals  of care.  Hermelinda Dellen, D.O.

## 2017-04-13 NOTE — Progress Notes (Signed)
[facility]  ROUNDING NOTE   Subjective:   Husband at bedside.  CRRT   vasopressors: vasopressin, norepinephrine and phenylephrine.   Objective:  Vital signs in last 24 hours:  Temp:  [96.6 F (35.9 C)-98.4 F (36.9 C)] 97 F (36.1 C) (07/06 0900) Pulse Rate:  [25-135] 30 (07/05 1821) Resp:  [14-27] 16 (07/06 0900) BP: (59-129)/(21-103) 87/67 (07/06 0900) SpO2:  [93 %-100 %] 99 % (07/06 0822) FiO2 (%):  [50 %-60 %] 50 % (07/06 0822) Weight:  [87.9 kg (193 lb 12.6 oz)] 87.9 kg (193 lb 12.6 oz) (07/06 0431)  Weight change: -1.8 kg (-3 lb 15.5 oz) Filed Weights   04/11/17 0405 04/12/17 0400 04/13/17 0431  Weight: 82.7 kg (182 lb 5.1 oz) 89.7 kg (197 lb 12 oz) 87.9 kg (193 lb 12.6 oz)    Intake/Output: I/O last 3 completed shifts: In: 9068 [I.V.:7093.3; Blood:490; NG/GT:1234.7; IV Piggyback:250] Out: 1869 [Urine:2; ELFYB:0175; Stool:225]   Intake/Output this shift:  No intake/output data recorded.  Physical Exam: General: Critically ill  Head: Normocephalic, atraumatic. ETT, OGT  Eyes: Anicteric, PERRL  Neck: Supple, trachea midline  Lungs:  Bilateral crackles, PRVC FiO2 50%  Heart: Irregular, tachycardia  Abdomen:  Soft, nontender, obese  Extremities: 1+ weeping bullae lower extremity edema. Bilateral dressings - clean and dry.   Neurologic: Intubated, sedated  Skin: Bullous lesions with serous anginous   Access: Right femoral temp HD catheter Dr. Lucky Cowboy 7/2    Basic Metabolic Panel:  Recent Labs Lab 04/12/17 1554 04/12/17 2000 04/12/17 2354 04/13/17 0427 04/13/17 0830  NA 142 140 140 140 139  K 4.0 3.8 3.8 3.9 3.9  CL 106 106 106 105 106  CO2 21* 19* 20* 21* 21*  GLUCOSE 120* 138* 118* 112* 124*  BUN '13 13 12 13 12  ' CREATININE 0.76 0.73 0.67 0.63 0.60  CALCIUM 8.3* 8.3* 8.1* 8.3* 8.4*  MG 1.9 1.8 1.7 1.7 1.6*  PHOS 2.2*  2.4* 2.4* 2.1* 2.1* 1.9*    Liver Function Tests:  Recent Labs Lab 04/08/17 0139  04/10/17 0138  04/11/17 1639   04/12/17 0450  04/12/17 1554 04/12/17 2000 04/12/17 2354 04/13/17 0427 04/13/17 0830  AST 46*  --  26  --  1,174*  --  3,323*  --   --   --   --  2,736*  --   ALT 44  --  24  --  823*  --  2,160*  --   --   --   --  2,237*  --   ALKPHOS 166*  --  107  --  214*  --  304*  --   --   --   --  395*  --   BILITOT 1.8*  --  1.1  --  3.3*  --  4.7*  --   --   --   --  7.0*  --   PROT 5.5*  --  4.3*  --  4.6*  --  5.2*  --   --   --   --  5.3*  --   ALBUMIN 2.8*  < > 2.0*  2.2*  < > 3.1*  < > 3.5  < > 3.5 3.6 3.4* 3.4* 3.4*  < > = values in this interval not displayed. No results for input(s): LIPASE, AMYLASE in the last 168 hours. No results for input(s): AMMONIA in the last 168 hours.  CBC:  Recent Labs Lab 04/08/17 0139  04/11/17 1639 04/11/17 2145 04/12/17 0450 04/12/17  1852 04/13/17 0753  WBC 13.2*  < > 10.2 13.0* 12.7* 14.1* 19.0*  NEUTROABS 12.0*  --  8.0* 12.0*  --  12.7* PENDING  HGB 15.0  < > 6.8* 7.6* 7.1* 7.9* 8.1*  HCT 45.2  < > 21.3* 23.2* 22.3* 24.3* 25.0*  MCV 95.0  < > 96.7 96.7 96.4 93.5 91.6  PLT 142*  < > 31* 38* 37* 103* 81*  < > = values in this interval not displayed.  Cardiac Enzymes:  Recent Labs Lab 04/08/17 0139 04/12/17 0732 04/12/17 1217  TROPONINI 0.05* 0.09* 0.08*    BNP: Invalid input(s): POCBNP  CBG:  Recent Labs Lab 04/12/17 1842 04/12/17 2020 04/13/17 0011 04/13/17 0444 04/13/17 0737  GLUCAP 75 144* 117* 124* 115*    Microbiology: Results for orders placed or performed during the hospital encounter of 04/08/17  Culture, blood (routine x 2)     Status: None   Collection Time: 04/08/17  2:04 AM  Result Value Ref Range Status   Specimen Description BLOOD RIGHT ANTECUBITAL  Final   Special Requests   Final    BOTTLES DRAWN AEROBIC AND ANAEROBIC Blood Culture adequate volume   Culture NO GROWTH 5 DAYS  Final   Report Status 04/13/2017 FINAL  Final  Culture, blood (routine x 2)     Status: None   Collection Time: 04/08/17   2:06 AM  Result Value Ref Range Status   Specimen Description BLOOD LEFT ANTECUBITAL  Final   Special Requests   Final    BOTTLES DRAWN AEROBIC AND ANAEROBIC Blood Culture adequate volume   Culture NO GROWTH 5 DAYS  Final   Report Status 04/13/2017 FINAL  Final  C difficile quick scan w PCR reflex     Status: None   Collection Time: 04/08/17  5:09 AM  Result Value Ref Range Status   C Diff antigen NEGATIVE NEGATIVE Final   C Diff toxin NEGATIVE NEGATIVE Final   C Diff interpretation No C. difficile detected.  Final  MRSA PCR Screening     Status: None   Collection Time: 04/08/17  6:50 AM  Result Value Ref Range Status   MRSA by PCR NEGATIVE NEGATIVE Final    Comment:        The GeneXpert MRSA Assay (FDA approved for NASAL specimens only), is one component of a comprehensive MRSA colonization surveillance program. It is not intended to diagnose MRSA infection nor to guide or monitor treatment for MRSA infections.   Urine culture     Status: None   Collection Time: 04/08/17 12:16 PM  Result Value Ref Range Status   Specimen Description URINE, RANDOM  Final   Special Requests NONE  Final   Culture   Final    NO GROWTH Performed at [facility], [address], [place], [place] [address]    Report Status 04/10/2017 FINAL  Final  Aerobic Culture (superficial specimen)     Status: None   Collection Time: 04/09/17 11:32 AM  Result Value Ref Range Status   Specimen Description SKIN  Final   Special Requests NONE  Final   Gram Stain   Final    RARE WBC PRESENT, PREDOMINANTLY PMN NO SQUAMOUS EPITHELIAL CELLS SEEN NO ORGANISMS SEEN    Culture   Final    NO GROWTH 2 DAYS Performed at [facility], [place] [address], [place], [place] [address]    Report Status 04/11/2017 FINAL  Final  Gastrointestinal Panel by PCR , Stool  Status: None   Collection Time: 04/09/17  5:10 PM  Result Value Ref Range Status   Campylobacter species NOT DETECTED NOT DETECTED Final    Plesimonas shigelloides NOT DETECTED NOT DETECTED Final   Salmonella species NOT DETECTED NOT DETECTED Final   Yersinia enterocolitica NOT DETECTED NOT DETECTED Final   Vibrio species NOT DETECTED NOT DETECTED Final   Vibrio cholerae NOT DETECTED NOT DETECTED Final   Enteroaggregative E coli (EAEC) NOT DETECTED NOT DETECTED Final   Enteropathogenic E coli (EPEC) NOT DETECTED NOT DETECTED Final   Enterotoxigenic E coli (ETEC) NOT DETECTED NOT DETECTED Final   Shiga like toxin producing E coli (STEC) NOT DETECTED NOT DETECTED Final   Shigella/Enteroinvasive E coli (EIEC) NOT DETECTED NOT DETECTED Final   Cryptosporidium NOT DETECTED NOT DETECTED Final   Cyclospora cayetanensis NOT DETECTED NOT DETECTED Final   Entamoeba histolytica NOT DETECTED NOT DETECTED Final   Giardia lamblia NOT DETECTED NOT DETECTED Final   Adenovirus F40/41 NOT DETECTED NOT DETECTED Final   Astrovirus NOT DETECTED NOT DETECTED Final   Norovirus GI/GII NOT DETECTED NOT DETECTED Final   Rotavirus A NOT DETECTED NOT DETECTED Final   Sapovirus (I, II, IV, and V) NOT DETECTED NOT DETECTED Final  Culture, respiratory (NON-Expectorated)     Status: None (Preliminary result)   Collection Time: 04/12/17 12:00 PM  Result Value Ref Range Status   Specimen Description TRACHEAL ASPIRATE  Final   Special Requests NONE  Final   Gram Stain   Final    MODERATE WBC PRESENT,BOTH PMN AND MONONUCLEAR MODERATE YEAST Performed at [facility], [address], [place], [place] [address]    Culture PENDING  Incomplete   Report Status PENDING  Incomplete    Coagulation Studies:  Recent Labs  04/11/17 1847  LABPROT 42.2*  INR 4.27*    Urinalysis: No results for input(s): COLORURINE, LABSPEC, PHURINE, GLUCOSEU, HGBUR, BILIRUBINUR, KETONESUR, PROTEINUR, UROBILINOGEN, NITRITE, LEUKOCYTESUR in the last 72 hours.  Invalid input(s): APPERANCEUR    Imaging: Dg Chest Port 1 View  Result Date: 04/13/2017 CLINICAL DATA:   Acute respiratory failure. EXAM: PORTABLE CHEST 1 VIEW COMPARISON:  04/12/2017 FINDINGS: Support apparatus is stable. The cardiac silhouette is enlarged. Mediastinal contours appear intact. There is no evidence of pneumothorax. Mixed pattern pulmonary edema versus pulmonary infiltrates have slightly improved. There is a stable right pleural effusion. Possible small left pleural effusion. Osseous structures are without acute abnormality. Soft tissues are grossly normal. IMPRESSION: Stable support apparatus. Slight improvement in mixed pattern pulmonary edema versus infiltrates in bilateral lungs. Probable bilateral pleural effusions. Electronically Signed   By: Fidela Salisbury M.D.   On: 04/13/2017 10:08   Dg Chest Port 1 View  Result Date: 04/12/2017 CLINICAL DATA:  Respiratory failure. EXAM: PORTABLE CHEST 1 VIEW COMPARISON:  04/11/2017. FINDINGS: Endotracheal tube, left IJ line, NG tube in stable position. Cardiomegaly with prominent diffuse bilateral pulmonary infiltrates/ edema again noted without interim change. Small right pleural effusion again noted. No pneumothorax. IMPRESSION: 1.  Lines and tubes stable position. 2. Cardiomegaly with prominent diffuse bilateral pulmonary infiltrates/ edema and small right pleural effusion. No significant interim change. Electronically Signed   By: Marcello Moores  Register   On: 04/12/2017 06:30   Dg Chest Port 1 View  Result Date: 04/11/2017 CLINICAL DATA:  Post intubation and NG tube placement. EXAM: PORTABLE CHEST 1 VIEW COMPARISON:  04/10/2017 FINDINGS: 1733 hours. Endotracheal tube tip is 3.2 cm above the base of the carina. Left IJ central line tip  overlies the distal SVC region. The NG tube passes into the stomach although the distal tip position is not included on the film. The cardio pericardial silhouette is enlarged. Interval progression of diffuse interstitial and bilateral airspace disease. Persistent small right pleural effusion. Defibrillator pads overlie  the chest. IMPRESSION: 1. Support apparatus appears appropriately positioned. 2. Bilateral interstitial and airspace disease compatible with diffuse pneumonia or asymmetric pulmonary edema. 3. Small right pleural effusion. Electronically Signed   By: Misty Stanley M.D.   On: 04/11/2017 17:44   Dg Abd Portable 1v  Result Date: 04/11/2017 CLINICAL DATA:  Intubation. EXAM: PORTABLE ABDOMEN - 1 VIEW COMPARISON:  CT from 04/04/2017 FINDINGS: Nasogastric tube with tip in the low stomach. The stomach is moderately gas distended. Rectal tube is also present. Right femoral catheter with tip at the L2-3 disc level. Amorphous density over the right iliac fossa is likely enteric. No calcifications seen in this area on recent CT. Questionable fold thickening of pelvic bowel loops. Bowel gas pattern is no overall nonobstructive. IMPRESSION: 1. Nasogastric tube tip is at the distal stomach. 2. Moderate gaseous distention of the stomach. 3. Right femoral venous catheter with tip at the L2-3 disc level. Electronically Signed   By: Monte Fantasia M.D.   On: 04/11/2017 18:07     Medications:   . amiodarone 30 mg/hr (04/13/17 0839)  . fentaNYL infusion INTRAVENOUS 25 mcg/hr (04/13/17 0520)  . norepinephrine (LEVOPHED) Adult infusion 4 mcg/min (04/13/17 1032)  . phenylephrine (NEO-SYNEPHRINE) Adult infusion 160 mcg/min (04/13/17 0804)  . piperacillin-tazobactam (ZOSYN)  IV Stopped (04/13/17 0927)  . pureflow 2,000 mL/hr at 04/13/17 0837  . vasopressin (PITRESSIN) infusion - *FOR SHOCK* 0.03 Units/min (04/12/17 1814)   . chlorhexidine gluconate (MEDLINE KIT)  15 mL Mouth Rinse BID  . feeding supplement (PRO-STAT SUGAR FREE 64)  30 mL Per Tube BID  . feeding supplement (VITAL HIGH PROTEIN)  1,000 mL Per Tube Q24H  . ipratropium-albuterol  3 mL Nebulization Q6H  . levothyroxine  25 mcg Intravenous Daily  . mouth rinse  15 mL Mouth Rinse 10 times per day  . pantoprazole (PROTONIX) IV  40 mg Intravenous Q24H  .  sertraline  25 mg Oral Daily  . silver sulfADIAZINE   Topical BID  . tuberculin  5 Units Intradermal Once   artificial tears, fentaNYL, heparin, metoprolol tartrate, [DISCONTINUED] ondansetron **OR** ondansetron (ZOFRAN) IV, sodium chloride flush, zinc oxide  Assessment/ Plan:  Ms. Takela Varden is a 66 y.o. white female  with hypertension, hypothyroidism, atrial fibrillation, who was admitted to [facility] on 04/08/2017 for evaluation of increasing lower extremity edema.  1. Acute renal failure with proteinuria and hematuria: baseline creatinine of 0.62 09/2014.  Progressive peripheral edema and hypoalbuminemia.  Recent IV contrast exposure on 04/04/17.  - Serologic work up: compliments normal, negative SPEP/UPEP, ANA negative, ANCA negative, GBM negative, negative hepatitis - Continue CVVHD. UF of 74m/hr - Discussed case with husband  2. Hypotension with atrial fibrillation with rapid ventricular response: now back on vasopressors:  septic and cardiogenic shock. Echocardiogram with EF of 25-30% - Broad spectrum antibiotics: pip/tazo. Appreciate ID input.   - amiodarone gtt  3. Anemia with renal failure and Thrombocytopenia: platelets improved 81.  - recommend transfusion PRBC  4. 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 Hemoglobin A1c 6.2% - continue glucose control.    Prognosis is grimm.   LOS: 5[place]7/6/201810:34 AM

## 2017-04-13 NOTE — Progress Notes (Signed)
[facility] Cardiology  SUBJECTIVE: Patient on ventilator, denies chest pain   Vitals:   04/13/17 0300 04/13/17 0423 04/13/17 0431 04/13/17 0500  BP:   101/68 107/65  Pulse:      Resp:      Temp: 98.1 F (36.7 C)  97.7 F (36.5 C) 98.1 F (36.7 C)  TempSrc: Core (Comment)  Core (Comment) Axillary  SpO2: 98% 100% 100% 100%  Weight:   87.9 kg (193 lb 12.6 oz)   Height:         Intake/Output Summary (Last 24 hours) at 04/13/17 0743 Last data filed at 04/13/17 0600  Gross per 24 hour  Intake          6513.33 ml  Output             1338 ml  Net          5175.33 ml      PHYSICAL EXAM  General: Acutely ill appearing patient on ventilator HEENT:  Normocephalic and atramatic Neck:  No JVD.  Lungs: Clear bilaterally to auscultation and percussion. Heart: Irregularly irregular rhythm  Abdomen: Bowel sounds are positive, abdomen soft and non-tender  Msk:  Back normal, normal gait. Normal strength and tone for age. Extremities: No clubbing, cyanosis or edema.   Neuro: Grossly normal Psych:  Lethargic but responds appropriately to questions   LABS: Basic Metabolic Panel:  Recent Labs  04/12/17 2354 04/13/17 0427  NA 140 140  K 3.8 3.9  CL 106 105  CO2 20* 21*  GLUCOSE 118* 112*  BUN 12 13  CREATININE 0.67 0.63  CALCIUM 8.1* 8.3*  MG 1.7 1.7  PHOS 2.1* 2.1*   Liver Function Tests:  Recent Labs  04/12/17 0450  04/12/17 2354 04/13/17 0427  AST 3,323*  --   --  2,736*  ALT 2,160*  --   --  2,237*  ALKPHOS 304*  --   --  395*  BILITOT 4.7*  --   --  7.0*  PROT 5.2*  --   --  5.3*  ALBUMIN 3.5  < > 3.4* 3.4*  < > = values in this interval not displayed. No results for input(s): LIPASE, AMYLASE in the last 72 hours. CBC:  Recent Labs  04/11/17 2145 04/12/17 0450 04/12/17 1852  WBC 13.0* 12.7* 14.1*  NEUTROABS 12.0*  --  12.7*  HGB 7.6* 7.1* 7.9*  HCT 23.2* 22.3* 24.3*  MCV 96.7 96.4 93.5  PLT 38* 37* 103*   Cardiac Enzymes:  Recent Labs  04/12/17 0732  04/12/17 1217  TROPONINI 0.09* 0.08*   BNP: Invalid input(s): POCBNP D-Dimer: No results for input(s): DDIMER in the last 72 hours. Hemoglobin A1C: No results for input(s): HGBA1C in the last 72 hours. Fasting Lipid Panel: No results for input(s): CHOL, HDL, LDLCALC, TRIG, CHOLHDL, LDLDIRECT in the last 72 hours. Thyroid Function Tests: No results for input(s): TSH, T4TOTAL, T3FREE, THYROIDAB in the last 72 hours.  Invalid input(s): FREET3 Anemia Panel: No results for input(s): VITAMINB12, FOLATE, FERRITIN, TIBC, IRON, RETICCTPCT in the last 72 hours.  Dg Chest Port 1 View  Result Date: 04/12/2017 CLINICAL DATA:  Respiratory failure. EXAM: PORTABLE CHEST 1 VIEW COMPARISON:  04/11/2017. FINDINGS: Endotracheal tube, left IJ line, NG tube in stable position. Cardiomegaly with prominent diffuse bilateral pulmonary infiltrates/ edema again noted without interim change. Small right pleural effusion again noted. No pneumothorax. IMPRESSION: 1.  Lines and tubes stable position. 2. Cardiomegaly with prominent diffuse bilateral pulmonary infiltrates/ edema and small right pleural effusion.  No significant interim change. Electronically Signed   By: Marcello Moores  Register   On: 04/12/2017 06:30   Dg Chest Port 1 View  Result Date: 04/11/2017 CLINICAL DATA:  Post intubation and NG tube placement. EXAM: PORTABLE CHEST 1 VIEW COMPARISON:  04/10/2017 FINDINGS: 1733 hours. Endotracheal tube tip is 3.2 cm above the base of the carina. Left IJ central line tip overlies the distal SVC region. The NG tube passes into the stomach although the distal tip position is not included on the film. The cardio pericardial silhouette is enlarged. Interval progression of diffuse interstitial and bilateral airspace disease. Persistent small right pleural effusion. Defibrillator pads overlie the chest. IMPRESSION: 1. Support apparatus appears appropriately positioned. 2. Bilateral interstitial and airspace disease compatible with  diffuse pneumonia or asymmetric pulmonary edema. 3. Small right pleural effusion. Electronically Signed   By: Misty Stanley M.D.   On: 04/11/2017 17:44   Dg Abd Portable 1v  Result Date: 04/11/2017 CLINICAL DATA:  Intubation. EXAM: PORTABLE ABDOMEN - 1 VIEW COMPARISON:  CT from 04/04/2017 FINDINGS: Nasogastric tube with tip in the low stomach. The stomach is moderately gas distended. Rectal tube is also present. Right femoral catheter with tip at the L2-3 disc level. Amorphous density over the right iliac fossa is likely enteric. No calcifications seen in this area on recent CT. Questionable fold thickening of pelvic bowel loops. Bowel gas pattern is no overall nonobstructive. IMPRESSION: 1. Nasogastric tube tip is at the distal stomach. 2. Moderate gaseous distention of the stomach. 3. Right femoral venous catheter with tip at the L2-3 disc level. Electronically Signed   By: Monte Fantasia M.D.   On: 04/11/2017 18:07     Echo LV EF 25-30%  TELEMETRY: Atrial fibrillation with a rapid ventricular rate:  ASSESSMENT AND PLAN:  Active Problems:   Septic shock ([place])    1. 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 Borderline elevated troponin, in the absence of chest pain or new ECG changes, very likely due to demand supply ischemia, not due to acute coronary syndrome 3. Atrial fibrillation with a rapid ventricular rate, and off amiodarone drip, compensatory for septic shock syndrome and hypotension 4. Left lower extremity cellulitis, septic shock, with multiorgan failure including respiratory, renal, liver, cardiac, on multiple vasopressors, and on wide spectrum antibiotics 5. Cardiopulmonary arrest, transient bradycardia, PEA, hypoglycemia, requiring CPR and intubation, currently on ventilator on multiple vasopressors 6. Renal failure, on CRRT 7. Shock liver 8. Very poor prognosis  Recommendations  1. Agree with  overall current therapy 2. Continue amiodarone drip for rate control 3. May start heart failure medications once blood pressure stabilized and patient able to take by mouth 4. Agree that patient may need transition to comfort care if no significant improvement observed in the next 24-48 hrs  Sign off for now, please call if any questions   Isaias Cowman, MD, PhD, [facility] 04/13/2017 7:43 AM

## 2017-04-13 NOTE — Progress Notes (Signed)
CH assisted another [place] in this visit. Pt is intubated and unable to respond. Husband is bedside. Husband has been told he has some difficult decisions to make as the Pt organs are starting to shut down. The husband is distraught. [place] provided the ministry of comforting presence and empathetic listening.  CH is available for follow up as needed.    04/13/17 1400  Clinical Encounter Type  Visited With Family  Visit Type Follow-up;Spiritual support  Referral From Nurse  Consult/Referral To Chaplain  Spiritual Encounters  Spiritual Needs Emotional;Grief support

## 2017-04-14 ENCOUNTER — Inpatient Hospital Stay: Payer: Medicare Other

## 2017-04-14 LAB — CBC WITH DIFFERENTIAL/PLATELET
Band Neutrophils: 3 %
Basophils Absolute: 0 10*3/uL (ref 0–0.1)
Basophils Relative: 0 %
Blasts: 0 %
Eosinophils Absolute: 0 10*3/uL (ref 0–0.7)
Eosinophils Relative: 0 %
HCT: 22.2 % — ABNORMAL LOW (ref 35.0–47.0)
Hemoglobin: 7.4 g/dL — ABNORMAL LOW (ref 12.0–16.0)
Lymphocytes Relative: 14 %
Lymphs Abs: 2.2 10*3/uL (ref 1.0–3.6)
MCH: 29.8 pg (ref 26.0–34.0)
MCHC: 33.2 g/dL (ref 32.0–36.0)
MCV: 89.9 fL (ref 80.0–100.0)
Metamyelocytes Relative: 1 %
Monocytes Absolute: 0 10*3/uL — ABNORMAL LOW (ref 0.2–0.9)
Monocytes Relative: 0 %
Myelocytes: 2 %
Neutro Abs: 13.2 10*3/uL — ABNORMAL HIGH (ref 1.4–6.5)
Neutrophils Relative %: 80 %
Other: 0 %
Platelets: 43 10*3/uL — ABNORMAL LOW (ref 150–440)
Promyelocytes Absolute: 0 %
RBC: 2.47 MIL/uL — ABNORMAL LOW (ref 3.80–5.20)
RDW: 18 % — ABNORMAL HIGH (ref 11.5–14.5)
WBC: 15.4 10*3/uL — ABNORMAL HIGH (ref 3.6–11.0)
nRBC: 2 /100 WBC — ABNORMAL HIGH

## 2017-04-14 LAB — RENAL FUNCTION PANEL
ALBUMIN: 2.7 g/dL — AB (ref 3.5–5.0)
ANION GAP: 8 (ref 5–15)
Albumin: 2.9 g/dL — ABNORMAL LOW (ref 3.5–5.0)
Albumin: 2.8 g/dL — ABNORMAL LOW (ref 3.5–5.0)
Albumin: 2.7 g/dL — ABNORMAL LOW (ref 3.5–5.0)
Anion gap: 7 (ref 5–15)
Anion gap: 6 (ref 5–15)
Anion gap: 6 (ref 5–15)
BUN: 17 mg/dL (ref 6–20)
BUN: 18 mg/dL (ref 6–20)
BUN: 19 mg/dL (ref 6–20)
BUN: 21 mg/dL — AB (ref 6–20)
CALCIUM: 8 mg/dL — AB (ref 8.9–10.3)
CALCIUM: 8.1 mg/dL — AB (ref 8.9–10.3)
CALCIUM: 8.1 mg/dL — AB (ref 8.9–10.3)
CHLORIDE: 104 mmol/L (ref 101–111)
CHLORIDE: 104 mmol/L (ref 101–111)
CO2: 26 mmol/L (ref 22–32)
CO2: 26 mmol/L (ref 22–32)
CO2: 25 mmol/L (ref 22–32)
CO2: 26 mmol/L (ref 22–32)
CREATININE: 0.63 mg/dL (ref 0.44–1.00)
CREATININE: 0.62 mg/dL (ref 0.44–1.00)
CREATININE: 0.57 mg/dL (ref 0.44–1.00)
CREATININE: 0.52 mg/dL (ref 0.44–1.00)
Calcium: 8.1 mg/dL — ABNORMAL LOW (ref 8.9–10.3)
Chloride: 107 mmol/L (ref 101–111)
Chloride: 104 mmol/L (ref 101–111)
GFR calc Af Amer: >60 mL/min (ref >60)
GFR calc Af Amer: >60 mL/min (ref >60)
GFR calc non Af Amer: >60 mL/min (ref >60)
GFR calc non Af Amer: >60 mL/min (ref >60)
GFR calc non Af Amer: >60 mL/min (ref >60)
GLUCOSE: 146 mg/dL — AB (ref 65–99)
GLUCOSE: 139 mg/dL — AB (ref 65–99)
GLUCOSE: 111 mg/dL — AB (ref 65–99)
Glucose, Bld: 157 mg/dL — ABNORMAL HIGH (ref 65–99)
PHOSPHORUS: 2 mg/dL — AB (ref 2.5–4.6)
POTASSIUM: 4.1 mmol/L (ref 3.5–5.1)
Phosphorus: 1.1 mg/dL — ABNORMAL LOW (ref 2.5–4.6)
Phosphorus: <1 mg/dL — CL (ref 2.5–4.6)
Phosphorus: 1.5 mg/dL — ABNORMAL LOW (ref 2.5–4.6)
Potassium: 3.6 mmol/L (ref 3.5–5.1)
Potassium: 3.5 mmol/L (ref 3.5–5.1)
Potassium: 3.9 mmol/L (ref 3.5–5.1)
SODIUM: 140 mmol/L (ref 135–145)
SODIUM: 136 mmol/L (ref 135–145)
SODIUM: 137 mmol/L (ref 135–145)
Sodium: 136 mmol/L (ref 135–145)

## 2017-04-14 LAB — GLUCOSE, CAPILLARY
Glucose-Capillary: 107 mg/dL — ABNORMAL HIGH (ref 65–99)
Glucose-Capillary: 127 mg/dL — ABNORMAL HIGH (ref 65–99)
Glucose-Capillary: 137 mg/dL — ABNORMAL HIGH (ref 65–99)
Glucose-Capillary: 142 mg/dL — ABNORMAL HIGH (ref 65–99)
Glucose-Capillary: 147 mg/dL — ABNORMAL HIGH (ref 65–99)
Glucose-Capillary: 154 mg/dL — ABNORMAL HIGH (ref 65–99)
Glucose-Capillary: 155 mg/dL — ABNORMAL HIGH (ref 65–99)

## 2017-04-14 LAB — THYROID PANEL WITH TSH
Free Thyroxine Index: 1 — ABNORMAL LOW (ref 1.2–4.9)
T3 Uptake Ratio: 29 % (ref 24–39)
T4 TOTAL: 3.3 ug/dL — AB (ref 4.5–12.0)
TSH: 6.6 u[IU]/mL — ABNORMAL HIGH (ref 0.450–4.500)

## 2017-04-14 LAB — PROCALCITONIN: Procalcitonin: 16.09 ng/mL

## 2017-04-14 LAB — MAGNESIUM
Magnesium: 1.7 mg/dL (ref 1.7–2.4)
Magnesium: 1.6 mg/dL — ABNORMAL LOW (ref 1.7–2.4)
Magnesium: 1.9 mg/dL (ref 1.7–2.4)
Magnesium: 1.8 mg/dL (ref 1.7–2.4)

## 2017-04-14 LAB — HEPATIC FUNCTION PANEL
ALT: 1297 U/L — ABNORMAL HIGH (ref 14–54)
AST: 762 U/L — AB (ref 15–41)
Albumin: 2.8 g/dL — ABNORMAL LOW (ref 3.5–5.0)
Alkaline Phosphatase: 482 U/L — ABNORMAL HIGH (ref 38–126)
BILIRUBIN DIRECT: 3.1 mg/dL — AB (ref 0.1–0.5)
BILIRUBIN INDIRECT: 2.7 mg/dL — AB (ref 0.3–0.9)
BILIRUBIN TOTAL: 5.8 mg/dL — AB (ref 0.3–1.2)
Total Protein: 4.7 g/dL — ABNORMAL LOW (ref 6.5–8.1)

## 2017-04-14 LAB — PHOSPHORUS
Phosphorus: 1.1 mg/dL — ABNORMAL LOW (ref 2.5–4.6)
Phosphorus: 2.1 mg/dL — ABNORMAL LOW (ref 2.5–4.6)

## 2017-04-14 LAB — POTASSIUM: POTASSIUM: 4.2 mmol/L (ref 3.5–5.1)

## 2017-04-14 LAB — CULTURE, RESPIRATORY

## 2017-04-14 LAB — APTT: APTT: 46 s — AB (ref 24–36)

## 2017-04-14 LAB — CULTURE, RESPIRATORY W GRAM STAIN

## 2017-04-14 MED ORDER — POTASSIUM PHOSPHATES 15 MMOLE/5ML IV SOLN
30.0000 mmol | Freq: Once | INTRAVENOUS | Status: AC
  Administered 2017-04-14: 30 mmol via INTRAVENOUS
  Filled 2017-04-14: qty 10

## 2017-04-14 MED ORDER — MAGNESIUM SULFATE 2 GM/50ML IV SOLN
2.0000 g | Freq: Once | INTRAVENOUS | Status: AC
  Administered 2017-04-14: 2 g via INTRAVENOUS
  Filled 2017-04-14: qty 50

## 2017-04-14 NOTE — Progress Notes (Signed)
Pharmacy Antibiotic Note/CRRT Dose Adjustment  [name] is a 66 y.o. female admitted on 04/08/2017 with progressive edema with bullae and cellulitis and anasarca.  Pharmacy has been consulted for Zosyn dosing. Patient is currently requiring CRRT.   Plan: 1. Continue Zosyn 3.375 g EI q 8 hours.   Will resume vancomycin per ID recommendation at 1500 mg iv once then 1000 mg iv q 24 hours. Will check a level prior to the third dose with a goal of 15-20 mcg/ml.    2. No medications require adjustment for CRRT at present. Will continue to monitor.     Height: 5\' 5"  (165.1 cm) Weight: 199 lb 8.3 oz (90.5 kg) IBW/kg (Calculated) : 57  Temp (24hrs), Avg:97.4 F (36.3 C), Min:97.3 F (36.3 C), Max:97.7 F (36.5 C)   Recent Labs Lab 04/08/17 0204 04/08/17 0509  04/09/17 0003 04/09/17 0423 04/09/17 1710  04/10/17 0533  04/11/17 2145  04/12/17 0450  04/12/17 1852  04/13/17 0753 04/13/17 0830 04/13/17 1335 04/13/17 2002 04/14/17 0159 04/14/17 0523 04/14/17 0751  WBC  --   --   --  12.9* 13.1*  --   --   --   < > 13.0*  --  12.7*  --  14.1*  --  19.0*  --   --   --   --  15.4*  --   CREATININE  --   --   < > 2.18* 2.01* 2.07*  2.28*  < >  --   < > 0.96  0.96  < > 0.65  < >  --   < >  --  0.60 0.65 0.44 0.63  --  0.62  LATICACIDVEN 5.2* 4.5*  --  4.3* 5.0*  --   --  2.7*  --   --   --   --   --   --   --   --   --   --   --   --   --   --   VANCORANDOM  --   --   --   --   --  18  --   --   --   --   --   --   --   --   --   --   --   --   --   --   --   --   < > = values in this interval not displayed.  Estimated Creatinine Clearance: 77.9 mL/min (by C-G formula based on SCr of 0.62 mg/dL).    No Known Allergies  Antimicrobials this admission: Vancomycin 7/1 >> 7/2 Zosyn 7/1 >> 7/2, 7/3 >>  Dose adjustments this admission:  Microbiology results: Wound Cx: NGTD UCx: NG BCx: NGTD GI PCR: negative C diff: negative MRSA PCR: negative  Thank you for allowing pharmacy  to be a part of this patient's care.  [name] A 04/14/2017 1:34 PM

## 2017-04-14 NOTE — Progress Notes (Signed)
[facility]  ROUNDING NOTE   Subjective:   Husband at bedside.  CRRT net +[id number] UOP 15 UF 1175  vasopressors: vasopressin  Objective:  Vital signs in last 24 hours:  Temp:  [95.9 F (35.5 C)-97.9 F (36.6 C)] 97.5 F (36.4 C) (07/07 0725) Pulse Rate:  [53-120] 106 (07/07 0800) Resp:  [13-23] 17 (07/07 0800) BP: (62-121)/(50-97) 98/77 (07/07 0800) SpO2:  [77 %-100 %] 100 % (07/07 0808) FiO2 (%):  [35 %-40 %] 35 % (07/07 0808) Weight:  [90.5 kg (199 lb 8.3 oz)] 90.5 kg (199 lb 8.3 oz) (07/07 0500)  Weight change: 2.6 kg (5 lb 11.7 oz) Filed Weights   04/12/17 0400 04/13/17 0431 04/14/17 0500  Weight: 89.7 kg (197 lb 12 oz) 87.9 kg (193 lb 12.6 oz) 90.5 kg (199 lb 8.3 oz)    Intake/Output: I/O last 3 completed shifts: In: 4887.6 [I.V.:3247.6; NG/GT:1440; IV Piggyback:200] Out: 1921 [Urine:15; Other:1756; Stool:150]   Intake/Output this shift:  Total I/O In: 68.1 [I.V.:28.1; NG/GT:40] Out: 47 [Other:47]  Physical Exam: General: Critically ill  Head: Normocephalic, atraumatic. ETT, OGT  Eyes: Anicteric, PERRL  Neck: Supple, trachea midline  Lungs:  Bilateral crackles, PRVC FiO2 35%  Heart: Irregular, tachycardia  Abdomen:  Soft, nontender, obese  Extremities: 1+ weeping bullae lower extremity edema. Bilateral dressings - clean and dry.   Neurologic: Intubated, sedated  Skin: Bullous lesions with serous anginous drainage  Access: Right femoral temp HD catheter Dr. Lucky Cowboy 7/2    Basic Metabolic Panel:  Recent Labs Lab 04/13/17 0830 04/13/17 1335 04/13/17 2002 04/14/17 0159 04/14/17 0523 04/14/17 0751  NA 139 139 137 140  --  136  K 3.9 3.9 3.7 3.6  --  3.5  CL 106 106 105 107  --  104  CO2 21* '24 25 26  ' --  26  GLUCOSE 124* 175* 155* 157*  --  146*  BUN '12 14 15 17  ' --  18  CREATININE 0.60 0.65 0.44 0.63  --  0.62  CALCIUM 8.4* 8.4* 8.2* 8.0*  --  8.1*  MG 1.6* 2.0 1.8 1.7  --  1.6*  PHOS 1.9* 2.0* 1.4* 1.1* 1.1* <1.0*    Liver Function  Tests:  Recent Labs Lab 04/08/17 0139  04/10/17 0138  04/11/17 1639  04/12/17 0450  04/13/17 0427 04/13/17 0830 04/13/17 1335 04/13/17 2002 04/14/17 0159 04/14/17 0751  AST 46*  --  26  --  1,174*  --  3,323*  --  2,736*  --   --   --   --   --   ALT 44  --  24  --  823*  --  2,160*  --  2,237*  --   --   --   --   --   ALKPHOS 166*  --  107  --  214*  --  304*  --  395*  --   --   --   --   --   BILITOT 1.8*  --  1.1  --  3.3*  --  4.7*  --  7.0*  --   --   --   --   --   PROT 5.5*  --  4.3*  --  4.6*  --  5.2*  --  5.3*  --   --   --   --   --   ALBUMIN 2.8*  < > 2.0*  2.2*  < > 3.1*  < > 3.5  < > 3.4* 3.4*  3.3* 3.0* 2.9* 2.8*  < > = values in this interval not displayed. No results for input(s): LIPASE, AMYLASE in the last 168 hours. No results for input(s): AMMONIA in the last 168 hours.  CBC:  Recent Labs Lab 04/11/17 1639 04/11/17 2145 04/12/17 0450 04/12/17 1852 04/13/17 0753 04/14/17 0523  WBC 10.2 13.0* 12.7* 14.1* 19.0* 15.4*  NEUTROABS 8.0* 12.0*  --  12.7* 17.3* 13.2*  HGB 6.8* 7.6* 7.1* 7.9* 8.1* 7.4*  HCT 21.3* 23.2* 22.3* 24.3* 25.0* 22.2*  MCV 96.7 96.7 96.4 93.5 91.6 89.9  PLT 31* 38* 37* 103* 81* 43*    Cardiac Enzymes:  Recent Labs Lab 04/08/17 0139 04/12/17 0732 04/12/17 1217  TROPONINI 0.05* 0.09* 0.08*    BNP: Invalid input(s): POCBNP  CBG:  Recent Labs Lab 04/13/17 1655 04/13/17 1944 04/14/17 0020 04/14/17 0541 04/14/17 0744  GLUCAP 173* 160* 154* 142* 137*    Microbiology: Results for orders placed or performed during the hospital encounter of 04/08/17  Culture, blood (routine x 2)     Status: None   Collection Time: 04/08/17  2:04 AM  Result Value Ref Range Status   Specimen Description BLOOD RIGHT ANTECUBITAL  Final   Special Requests   Final    BOTTLES DRAWN AEROBIC AND ANAEROBIC Blood Culture adequate volume   Culture NO GROWTH 5 DAYS  Final   Report Status 04/13/2017 FINAL  Final  Culture, blood (routine x 2)      Status: None   Collection Time: 04/08/17  2:06 AM  Result Value Ref Range Status   Specimen Description BLOOD LEFT ANTECUBITAL  Final   Special Requests   Final    BOTTLES DRAWN AEROBIC AND ANAEROBIC Blood Culture adequate volume   Culture NO GROWTH 5 DAYS  Final   Report Status 04/13/2017 FINAL  Final  C difficile quick scan w PCR reflex     Status: None   Collection Time: 04/08/17  5:09 AM  Result Value Ref Range Status   C Diff antigen NEGATIVE NEGATIVE Final   C Diff toxin NEGATIVE NEGATIVE Final   C Diff interpretation No C. difficile detected.  Final  MRSA PCR Screening     Status: None   Collection Time: 04/08/17  6:50 AM  Result Value Ref Range Status   MRSA by PCR NEGATIVE NEGATIVE Final    Comment:        The GeneXpert MRSA Assay (FDA approved for NASAL specimens only), is one component of a comprehensive MRSA colonization surveillance program. It is not intended to diagnose MRSA infection nor to guide or monitor treatment for MRSA infections.   Urine culture     Status: None   Collection Time: 04/08/17 12:16 PM  Result Value Ref Range Status   Specimen Description URINE, RANDOM  Final   Special Requests NONE  Final   Culture   Final    NO GROWTH Performed at [facility], [address] [place], [place] [address]    Report Status 04/10/2017 FINAL  Final  Aerobic Culture (superficial specimen)     Status: None   Collection Time: 04/09/17 11:32 AM  Result Value Ref Range Status   Specimen Description SKIN  Final   Special Requests NONE  Final   Gram Stain   Final    RARE WBC PRESENT, PREDOMINANTLY PMN NO SQUAMOUS EPITHELIAL CELLS SEEN NO ORGANISMS SEEN    Culture   Final    NO GROWTH 2 DAYS Performed at [facility], [address] [place], [place] [address]    Report Status 04/11/2017 FINAL  Final  Gastrointestinal Panel by PCR , Stool     Status: None   Collection Time: 04/09/17  5:10 PM  Result Value Ref Range Status    Campylobacter species NOT DETECTED NOT DETECTED Final   Plesimonas shigelloides NOT DETECTED NOT DETECTED Final   Salmonella species NOT DETECTED NOT DETECTED Final   Yersinia enterocolitica NOT DETECTED NOT DETECTED Final   Vibrio species NOT DETECTED NOT DETECTED Final   Vibrio cholerae NOT DETECTED NOT DETECTED Final   Enteroaggregative E coli (EAEC) NOT DETECTED NOT DETECTED Final   Enteropathogenic E coli (EPEC) NOT DETECTED NOT DETECTED Final   Enterotoxigenic E coli (ETEC) NOT DETECTED NOT DETECTED Final   Shiga like toxin producing E coli (STEC) NOT DETECTED NOT DETECTED Final   Shigella/Enteroinvasive E coli (EIEC) NOT DETECTED NOT DETECTED Final   Cryptosporidium NOT DETECTED NOT DETECTED Final   Cyclospora cayetanensis NOT DETECTED NOT DETECTED Final   Entamoeba histolytica NOT DETECTED NOT DETECTED Final   Giardia lamblia NOT DETECTED NOT DETECTED Final   Adenovirus F40/41 NOT DETECTED NOT DETECTED Final   Astrovirus NOT DETECTED NOT DETECTED Final   Norovirus GI/GII NOT DETECTED NOT DETECTED Final   Rotavirus A NOT DETECTED NOT DETECTED Final   Sapovirus (I, II, IV, and V) NOT DETECTED NOT DETECTED Final  Culture, respiratory (NON-Expectorated)     Status: None (Preliminary result)   Collection Time: 04/12/17 12:00 PM  Result Value Ref Range Status   Specimen Description TRACHEAL ASPIRATE  Final   Special Requests NONE  Final   Gram Stain   Final    MODERATE WBC PRESENT,BOTH PMN AND MONONUCLEAR MODERATE YEAST Performed at [facility], [address] [place], [place] [address]    Culture MODERATE CANDIDA TROPICALIS  Final   Report Status PENDING  Incomplete    Coagulation Studies:  Recent Labs  04/11/17 1847  LABPROT 42.2*  INR 4.27*    Urinalysis: No results for input(s): COLORURINE, LABSPEC, PHURINE, GLUCOSEU, HGBUR, BILIRUBINUR, KETONESUR, PROTEINUR, UROBILINOGEN, NITRITE, LEUKOCYTESUR in the last 72 hours.  Invalid input(s): APPERANCEUR     Imaging: Dg Chest Port 1 View  Result Date: 04/14/2017 CLINICAL DATA:  Initial evaluation for acute respiratory failure. EXAM: PORTABLE CHEST 1 VIEW COMPARISON:  Prior radiograph from 04/13/2017. FINDINGS: Endotracheal tube in place with tip positioned 2.6 cm above the carina. Enteric tube courses in the the abdomen. Left IJ approach central venous catheter in place with tip overlying the distal SVC. Stable cardiomegaly. Mediastinal silhouette normal. Lungs hypoinflated. Persistent perihilar vascular congestion with interstitial prominence, suggesting underlying pulmonary edema. Superimposed bibasilar opacities, greatest within the mid and lower right lung, similar to previous. Persistent right pleural effusion. No pneumothorax. No acute osseous abnormality. IMPRESSION: 1. Support apparatus in satisfactory position as above. 2. Stable cardiomegaly with similar diffuse bilateral pulmonary infiltrates and/or edema and small right pleural effusion. No significant interval change. Electronically Signed   By: Jeannine Boga M.D.   On: 04/14/2017 06:28   Dg Chest Port 1 View  Result Date: 04/13/2017 CLINICAL DATA:  Acute respiratory failure. EXAM: PORTABLE CHEST 1 VIEW COMPARISON:  04/12/2017 FINDINGS: Support apparatus is stable. The cardiac silhouette is enlarged. Mediastinal contours appear intact. There is no evidence of pneumothorax. Mixed pattern pulmonary edema versus pulmonary infiltrates have slightly improved. There is a stable right pleural effusion. Possible small left pleural effusion. Osseous structures are without acute abnormality. Soft tissues are grossly  normal. IMPRESSION: Stable support apparatus. Slight improvement in mixed pattern pulmonary edema versus infiltrates in bilateral lungs. Probable bilateral pleural effusions. Electronically Signed   By: Fidela Salisbury M.D.   On: 04/13/2017 10:08     Medications:   . amiodarone 30 mg/hr (04/14/17 0834)  . fentaNYL infusion  INTRAVENOUS Stopped (04/14/17 0725)  . norepinephrine (LEVOPHED) Adult infusion Stopped (04/13/17 1045)  . phenylephrine (NEO-SYNEPHRINE) Adult infusion Stopped (04/13/17 1521)  . piperacillin-tazobactam (ZOSYN)  IV 3.375 g (04/14/17 0604)  . pureflow 2,000 each (04/14/17 0526)  . vancomycin    . vasopressin (PITRESSIN) infusion - *FOR SHOCK* 0.03 Units/min (04/14/17 0700)   . chlorhexidine gluconate (MEDLINE KIT)  15 mL Mouth Rinse BID  . feeding supplement (PRO-STAT SUGAR FREE 64)  30 mL Per Tube BID  . feeding supplement (VITAL HIGH PROTEIN)  1,000 mL Per Tube Q24H  . ipratropium-albuterol  3 mL Nebulization Q6H  . levothyroxine  25 mcg Intravenous Daily  . mouth rinse  15 mL Mouth Rinse 10 times per day  . pantoprazole (PROTONIX) IV  40 mg Intravenous Q24H  . sertraline  25 mg Oral Daily  . silver sulfADIAZINE   Topical BID   artificial tears, fentaNYL, heparin, metoprolol tartrate, [DISCONTINUED] ondansetron **OR** ondansetron (ZOFRAN) IV, sodium chloride flush, zinc oxide  Assessment/ Plan:  Ms. Noemie Devivo is a 66 y.o. white female  with hypertension, hypothyroidism, atrial fibrillation, who was admitted to [facility] on 04/08/2017 for evaluation of increasing lower extremity edema.  1. Acute renal failure with proteinuria and hematuria: baseline creatinine of 0.62 09/2014.  Progressive peripheral edema and hypoalbuminemia.  Recent IV contrast exposure on 04/04/17.  - Serologic work up: compliments normal, negative SPEP/UPEP, ANA negative, ANCA negative, GBM negative, negative hepatitis - Continue CVVHD. UF of 49m/hr - Discussed case with husband  2. Hypotension with atrial fibrillation with rapid ventricular response: on vasopressin Septic shock from cellulitis Cardiogenic shock from cardiomyopathy. Echocardiogram with EF of 25-30% - Broad spectrum antibiotics: pip/tazo and vanco. Appreciate ID input.   - amiodarone gtt  3. Anemia with renal failure and Thrombocytopenia:  platelets 43, hemoglobin 7.4  4. 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 Hemoglobin A1c 6.2% - continue glucose control.    L[place]: [place]7/7/20189:00 AM

## 2017-04-14 NOTE — Progress Notes (Signed)
Chaplain visited patient, while on rounds. No family was present and patient was not awake. Prayed silent prayer at patient's door.     04/14/17 1000  Clinical Encounter Type  Visited With Patient not available  Visit Type Follow-up;Spiritual support  Referral From Chaplain  Consult/Referral To Chaplain  Spiritual Encounters  Spiritual Needs Other (Comment)

## 2017-04-14 NOTE — Progress Notes (Addendum)
Patient ID: [name], female   DOB: Apr 09, 1951, 66 y.o.   MRN: [id number]  [facility] PROGRESS NOTE  [name] RSW:[id number] DOB: 07-Jan-1951 DOA: 04/08/2017 PCP: Shannon Harrier, MD  HPI/Subjective: Patient seen earlier this morning and was able to follow some simple commands even on the ventilator. Shook her head no to most questions.  Objective: Vitals:   04/14/17 1130 04/14/17 1200  BP: 114/70 107/65  Pulse:    Resp: 19 18  Temp:      Filed Weights   04/12/17 0400 04/13/17 0431 04/14/17 0500  Weight: 89.7 kg (197 lb 12 oz) 87.9 kg (193 lb 12.6 oz) 90.5 kg (199 lb 8.3 oz)    ROS: Review of Systems  Unable to perform ROS: Acuity of condition  Cardiovascular: Negative for chest pain.  Gastrointestinal: Negative for abdominal pain.  Musculoskeletal: Negative for joint pain.   patient shook her head no to some questions. Exam: Physical Exam  Constitutional: She is intubated.  HENT:  Head: Atraumatic.  Nose: No mucosal edema.  Eyes: Lids are normal. Pupils are equal, round, and reactive to light.  Neck: Carotid bruit is not present. No thyromegaly present.  Cardiovascular: Regular rhythm, S1 normal, S2 normal and normal heart sounds.   Respiratory: She is intubated. She has decreased breath sounds in the right lower field and the left lower field. She has no wheezes. She has no rhonchi. She has no rales.  GI: Soft. Bowel sounds are normal. There is no tenderness.  Musculoskeletal:       Right ankle: She exhibits no swelling.       Left ankle: She exhibits no swelling.  Lymphadenopathy:    She has no cervical adenopathy.  Neurological: She is alert.  Able to wiggle toes. Able to wiggle fingers.  Skin: Skin is warm. No cyanosis.  Lower extremities some erythema.   Psychiatric:  Able to follow some simple commands even on the ventilator      Data Reviewed: Basic Metabolic Panel:  Recent Labs Lab 04/13/17 0830 04/13/17 1335 04/13/17 2002  04/14/17 0159 04/14/17 0523 04/14/17 0751  NA 139 139 137 140  --  136  K 3.9 3.9 3.7 3.6  --  3.5  CL 106 106 105 107  --  104  CO2 21* _0 --  26  GLUCOSE 124* 175* 155* 157*  --  146*  BUN _1 --  18  CREATININE 0.60 0.65 0.44 0.63  --  0.62  CALCIUM 8.4* 8.4* 8.2* 8.0*  --  8.1*  MG 1.6* 2.0 1.8 1.7  --  1.6*  PHOS 1.9* 2.0* 1.4* 1.1* 1.1* <1.0*   Liver Function Tests:  Recent Labs Lab 04/08/17 0139  04/10/17 0138  04/11/17 1639  04/12/17 0450  04/13/17 0427 04/13/17 0830 04/13/17 1335 04/13/17 2002 04/14/17 0159 04/14/17 0751  AST 46*  --  26  --  1,174*  --  3,323*  --  2,736*  --   --   --   --   --   ALT 44  --  24  --  823*  --  2,160*  --  2,237*  --   --   --   --   --   ALKPHOS 166*  --  107  --  214*  --  304*  --  395*  --   --   --   --   --   BILITOT 1.8*  --  1.1  --  3.3*  --  4.7*  --  7.0*  --   --   --   --   --   PROT 5.5*  --  4.3*  --  4.6*  --  5.2*  --  5.3*  --   --   --   --   --   ALBUMIN 2.8*  < > 2.0*  2.2*  < > 3.1*  < > 3.5  < > 3.4* 3.4* 3.3* 3.0* 2.9* 2.8*  < > = values in this interval not displayed. No results for input(s): LIPASE, AMYLASE in the last 168 hours. No results for input(s): AMMONIA in the last 168 hours. CBC:  Recent Labs Lab 04/11/17 1639 04/11/17 2145 04/12/17 0450 04/12/17 1852 04/13/17 0753 04/14/17 0523  WBC 10.2 13.0* 12.7* 14.1* 19.0* 15.4*  NEUTROABS 8.0* 12.0*  --  12.7* 17.3* 13.2*  HGB 6.8* 7.6* 7.1* 7.9* 8.1* 7.4*  HCT 21.3* 23.2* 22.3* 24.3* 25.0* 22.2*  MCV 96.7 96.7 96.4 93.5 91.6 89.9  PLT 31* 38* 37* 103* 81* 43*   Cardiac Enzymes:  Recent Labs Lab 04/08/17 0139 04/12/17 0732 04/12/17 1217  TROPONINI 0.05* 0.09* 0.08*   BNP (last 3 results)  Recent Labs  04/08/17 0139  BNP 2,012.0*     CBG:  Recent Labs Lab 04/13/17 1944 04/14/17 0020 04/14/17 0541 04/14/17 0744 04/14/17 1137  GLUCAP 160* 154* 142* 137* 155*    Recent Results (from the past 240  hour(s))  Culture, blood (routine x 2)     Status: None   Collection Time: 04/08/17  2:04 AM  Result Value Ref Range Status   Specimen Description BLOOD RIGHT ANTECUBITAL  Final   Special Requests   Final    BOTTLES DRAWN AEROBIC AND ANAEROBIC Blood Culture adequate volume   Culture NO GROWTH 5 DAYS  Final   Report Status 04/13/2017 FINAL  Final  Culture, blood (routine x 2)     Status: None   Collection Time: 04/08/17  2:06 AM  Result Value Ref Range Status   Specimen Description BLOOD LEFT ANTECUBITAL  Final   Special Requests   Final    BOTTLES DRAWN AEROBIC AND ANAEROBIC Blood Culture adequate volume   Culture NO GROWTH 5 DAYS  Final   Report Status 04/13/2017 FINAL  Final  C difficile quick scan w PCR reflex     Status: None   Collection Time: 04/08/17  5:09 AM  Result Value Ref Range Status   C Diff antigen NEGATIVE NEGATIVE Final   C Diff toxin NEGATIVE NEGATIVE Final   C Diff interpretation No C. difficile detected.  Final  MRSA PCR Screening     Status: None   Collection Time: 04/08/17  6:50 AM  Result Value Ref Range Status   MRSA by PCR NEGATIVE NEGATIVE Final    Comment:        The GeneXpert MRSA Assay (FDA approved for NASAL specimens only), is one component of a comprehensive MRSA colonization surveillance program. It is not intended to diagnose MRSA infection nor to guide or monitor treatment for MRSA infections.   Urine culture     Status: None   Collection Time: 04/08/17 12:16 PM  Result Value Ref Range Status   Specimen Description URINE, RANDOM  Final   Special Requests NONE  Final   Culture   Final    NO GROWTH Performed at [facility], [address] [place], [place] [address]    Report Status 04/10/2017 FINAL  Final  Aerobic Culture (superficial specimen)     Status: None   Collection Time: 04/09/17 11:32 AM  Result Value Ref Range Status   Specimen Description SKIN  Final   Special Requests NONE  Final   Gram Stain   Final    RARE  WBC PRESENT, PREDOMINANTLY PMN NO SQUAMOUS EPITHELIAL CELLS SEEN NO ORGANISMS SEEN    Culture   Final    NO GROWTH 2 DAYS Performed at [facility], [place] [address] [place], [place] [address]    Report Status 04/11/2017 FINAL  Final  Gastrointestinal Panel by PCR , Stool     Status: None   Collection Time: 04/09/17  5:10 PM  Result Value Ref Range Status   Campylobacter species NOT DETECTED NOT DETECTED Final   Plesimonas shigelloides NOT DETECTED NOT DETECTED Final   Salmonella species NOT DETECTED NOT DETECTED Final   Yersinia enterocolitica NOT DETECTED NOT DETECTED Final   Vibrio species NOT DETECTED NOT DETECTED Final   Vibrio cholerae NOT DETECTED NOT DETECTED Final   Enteroaggregative E coli (EAEC) NOT DETECTED NOT DETECTED Final   Enteropathogenic E coli (EPEC) NOT DETECTED NOT DETECTED Final   Enterotoxigenic E coli (ETEC) NOT DETECTED NOT DETECTED Final   Shiga like toxin producing E coli (STEC) NOT DETECTED NOT DETECTED Final   Shigella/Enteroinvasive E coli (EIEC) NOT DETECTED NOT DETECTED Final   Cryptosporidium NOT DETECTED NOT DETECTED Final   Cyclospora cayetanensis NOT DETECTED NOT DETECTED Final   Entamoeba histolytica NOT DETECTED NOT DETECTED Final   Giardia lamblia NOT DETECTED NOT DETECTED Final   Adenovirus F40/41 NOT DETECTED NOT DETECTED Final   Astrovirus NOT DETECTED NOT DETECTED Final   Norovirus GI/GII NOT DETECTED NOT DETECTED Final   Rotavirus A NOT DETECTED NOT DETECTED Final   Sapovirus (I, II, IV, and V) NOT DETECTED NOT DETECTED Final  Culture, respiratory (NON-Expectorated)     Status: None (Preliminary result)   Collection Time: 04/12/17 12:00 PM  Result Value Ref Range Status   Specimen Description TRACHEAL ASPIRATE  Final   Special Requests NONE  Final   Gram Stain   Final    MODERATE WBC PRESENT,BOTH PMN AND MONONUCLEAR MODERATE YEAST Performed at [facility], [address] [place], [place] [address]    Culture MODERATE  CANDIDA TROPICALIS  Final   Report Status PENDING  Incomplete     Studies: Dg Chest Port 1 View  Result Date: 04/14/2017 CLINICAL DATA:  Initial evaluation for acute respiratory failure. EXAM: PORTABLE CHEST 1 VIEW COMPARISON:  Prior radiograph from 04/13/2017. FINDINGS: Endotracheal tube in place with tip positioned 2.6 cm above the carina. Enteric tube courses in the the abdomen. Left IJ approach central venous catheter in place with tip overlying the distal SVC. Stable cardiomegaly. Mediastinal silhouette normal. Lungs hypoinflated. Persistent perihilar vascular congestion with interstitial prominence, suggesting underlying pulmonary edema. Superimposed bibasilar opacities, greatest within the mid and lower right lung, similar to previous. Persistent right pleural effusion. No pneumothorax. No acute osseous abnormality. IMPRESSION: 1. Support apparatus in satisfactory position as above. 2. Stable cardiomegaly with similar diffuse bilateral pulmonary infiltrates and/or edema and small right pleural effusion. No significant interval change. Electronically Signed   By: Jeannine Boga M.D.   On: 04/14/2017 06:28   Dg Chest Port 1 View  Result Date: 04/13/2017 CLINICAL DATA:  Acute respiratory failure. EXAM: PORTABLE CHEST 1 VIEW COMPARISON:  04/12/2017 FINDINGS: Support apparatus is stable. The cardiac silhouette is enlarged. Mediastinal contours appear  intact. There is no evidence of pneumothorax. Mixed pattern pulmonary edema versus pulmonary infiltrates have slightly improved. There is a stable right pleural effusion. Possible small left pleural effusion. Osseous structures are without acute abnormality. Soft tissues are grossly normal. IMPRESSION: Stable support apparatus. Slight improvement in mixed pattern pulmonary edema versus infiltrates in bilateral lungs. Probable bilateral pleural effusions. Electronically Signed   By: Fidela Salisbury M.D.   On: 04/13/2017 10:08    Scheduled Meds: .  chlorhexidine gluconate (MEDLINE KIT)  15 mL Mouth Rinse BID  . feeding supplement (PRO-STAT SUGAR FREE 64)  30 mL Per Tube BID  . feeding supplement (VITAL HIGH PROTEIN)  1,000 mL Per Tube Q24H  . ipratropium-albuterol  3 mL Nebulization Q6H  . levothyroxine  25 mcg Intravenous Daily  . mouth rinse  15 mL Mouth Rinse 10 times per day  . pantoprazole (PROTONIX) IV  40 mg Intravenous Q24H  . sertraline  25 mg Oral Daily  . silver sulfADIAZINE   Topical BID   Continuous Infusions: . amiodarone 30 mg/hr (04/14/17 0834)  . fentaNYL infusion INTRAVENOUS Stopped (04/14/17 0725)  . norepinephrine (LEVOPHED) Adult infusion Stopped (04/13/17 1045)  . phenylephrine (NEO-SYNEPHRINE) Adult infusion Stopped (04/13/17 1521)  . piperacillin-tazobactam (ZOSYN)  IV Stopped (04/14/17 1004)  . potassium phosphate IVPB (mmol) 30 mmol (04/14/17 1018)  . pureflow 3 each (04/14/17 1157)  . vancomycin    . vasopressin (PITRESSIN) infusion - *FOR SHOCK* 0.01 Units/min (04/14/17 1200)    Assessment/Plan:  1. Septic shock. Patient now on 1 pressor at this time. Yesterday was on 3 pressors. Continue antibiotics as per infectious disease with vancomycin and Zosyn. Patient had lower extremity bullus cellulitis 2. PEA Cardiopulmonary arrest. Supportive care with ventilator at this time. Trials of cutting down sedation showed that she is following some simple commands.  3. Shock liver from cardiopulmonary arrest 4. Acute kidney injury on continuous dialysis as per nephrology 5. Hypomagnesemia, hypophosphatemia and hypokalemia. Electrolyte protocol as per ICU 6. Atrial fibrillation on amiodarone for rate control. Pradaxa on hold 7. Coagulopathy. Could be secondary to shock liver 8. Diarrhea. Has rectal tube in. Stool studies are negative 9. Cardiomyopathy on echocardiogram. Blood pressure too low for any medications at this point  Code Status:     Code Status Orders        Start     Ordered   04/08/17 0651   Full code  Continuous     04/08/17 0650    Code Status History    Date Active Date Inactive Code Status Order ID Comments User Context   This patient has a current code status but no historical code status.     Family Communication: Spoke with husband at the bedside while I was rounding this morning Disposition Plan: To be determined based on clinical course  Consultants:  Critical care specialist  Infectious disease  Nephrology  Palliative care  Cardiology  Antibiotics:  Vancomycin  Zosyn  Time spent: 25 minutes  Loletha Grayer  [employer]

## 2017-04-14 NOTE — Progress Notes (Signed)
PULMONARY / CRITICAL CARE MEDICINE   Name: [name] MRN: [id number] DOB: [birth year]    ADMISSION DATE:  04/08/2017  PT PROFILE:   66 y.o. F with PMH of CAF, hypothyroidism admitted via ED to ICU with many days of progressive LE edema, blistering lesions on BLE, progressive weakness. Adm diagnosis of septic shock, cellulitis, AKI.  Pt PEA arrested 07/4 ACLS protocol initiated ROSC within 2 minutes following interventions.   MAJOR EVENTS/TEST RESULTS: 07/01 LE venous US: no DVT 07/01 Echocardiogram:  LVEF 25-30% 07/01 Nephrology consultation 07/02 ID consultation 07/02 CRRT initiated 07/03 Off vasopressors. Worsening tachycardia - amiodarone initiated 07/04 Pt PEA arrest ACLS protocol initiated ROSC 2 minutes now back on vasopressors  07/05 Intubated, shock req high dose vasopressors, on CRRT, elevated LFTs c/w congestion/shock liver, pulmonary edema vs ARDS on CXR  INDWELLING DEVICES:: R fem HD cath 07/02 >>  ETT 07/04 >>  Left IJ 07/04 >>   MICRO DATA: MRSA PCR 07/01 >> NEG Urine 07/01 >> NEG Blood 07/01 >> NEG C diff 07/01 >> NEG GI panel 07/02 >> NEG Resp 07/03 >>   ANTIMICROBIALS:  Vanc 07/01 >> 07/02 Pip-tazo 07/01 >>   SUBJECTIVE:  Pt remains intubated, have been able to come down on her pressor requirements now presently on vasopressin only  VITAL SIGNS: BP 108/88   Pulse (!) 106   Temp (!) 97.5 F (36.4 C) (Axillary)   Resp 17   Ht 5\' 5"  (1.651 m)   Wt 90.5 kg (199 lb 8.3 oz)   SpO2 100%   BMI 33.20 kg/m   VENTILATOR SETTINGS: Vent Mode: PRVC FiO2 (%):  [35 %-40 %] 35 % Set Rate:  [16 bmp] 16 bmp Vt Set:  [500 mL] 500 mL PEEP:  [5 cmH20] 5 cmH20 Plateau Pressure:  [17 cmH20] 17 cmH20  INTAKE / OUTPUT: I/O last 3 completed shifts: In: 4887.6 [I.V.:3247.6; NG/GT:1440; IV Piggyback:200] Out: 1921 [Urine:15; Other:1756; Stool:150]  PHYSICAL EXAMINATION: General: acutely ill appearing Caucasian female, mechanically intubated  Neuro: sedated, able  to nod head yes and no appropriately, PERRL  HEENT: supple, no JVD jaundice bilateral pupils Cardiovascular: IRIR, tachy, no M/R/G Lungs: crackles throughout, even, non labored; no wheezes   Abdomen: obese, soft, ND Ext: severe BLE edema from hips down, multiple bilateral bullous lesions  LABS:  BMET  Recent Labs Lab 04/13/17 2002 04/14/17 0159 04/14/17 0751  NA 137 140 136  K 3.7 3.6 3.5  CL 105 107 104  CO2 25 26 26   BUN 15 17 18   CREATININE 0.44 0.63 0.62  GLUCOSE 155* 157* 146*    Electrolytes  Recent Labs Lab 04/13/17 2002 04/14/17 0159 04/14/17 0523 04/14/17 0751  CALCIUM 8.2* 8.0*  --  8.1*  MG 1.8 1.7  --  1.6*  PHOS 1.4* 1.1* 1.1* <1.0*    CBC  Recent Labs Lab 04/12/17 1852 04/13/17 0753 04/14/17 0523  WBC 14.1* 19.0* 15.4*  HGB 7.9* 8.1* 7.4*  HCT 24.3* 25.0* 22.2*  PLT 103* 81* 43*    Coag's  Recent Labs Lab 04/09/17 0003 04/10/17 0534  04/11/17 1847 04/12/17 0450 04/13/17 0427 04/14/17 0523  APTT 113* 107*  < > 141* 74* 51* 46*  INR 5.67* 2.65  --  4.27*  --   --   --   < > = values in this interval not displayed.  Sepsis Markers  Recent Labs Lab 04/09/17 0003 04/09/17 0423  04/10/17 0533  04/12/17 0732 04/13/17 0427 04/14/17 0523  LATICACIDVEN 4.3* 5.0*  --  2.7*  --   --   --   --   PROCALCITON  --   --   < >  --   < > 11.38 12.33 16.09  < > = values in this interval not displayed.  ABG  Recent Labs Lab 04/11/17 1730 04/12/17 0500 04/12/17 0935  PHART 7.21* 7.29* 7.33*  PCO2ART 50* 35 37  PO2ART 55* 91 86    Liver Enzymes  Recent Labs Lab 04/11/17 1639  04/12/17 0450  04/13/17 0427  04/13/17 2002 04/14/17 0159 04/14/17 0751  AST 1,174*  --  3,323*  --  2,736*  --   --   --   --   ALT 823*  --  2,160*  --  2,237*  --   --   --   --   ALKPHOS 214*  --  304*  --  395*  --   --   --   --   BILITOT 3.3*  --  4.7*  --  7.0*  --   --   --   --   ALBUMIN 3.1*  < > 3.5  < > 3.4*  < > 3.0* 2.9* 2.8*  < > =  values in this interval not displayed.  Cardiac Enzymes  Recent Labs Lab 04/08/17 0139 04/12/17 0732 04/12/17 1217  TROPONINI 0.05* 0.09* 0.08*    Glucose  Recent Labs Lab 04/13/17 1547 04/13/17 1655 04/13/17 1944 04/14/17 0020 04/14/17 0541 04/14/17 0744  GLUCAP 180* 173* 160* 154* 142* 137*    CXR: NSC moderate R pleural effusion  ASSESSMENT / PLAN:  PULMONARY A: Acute hypoxic respiratory failure secondary to pulmonary edema and possible superimposed pneumonia  Mechanical Intubation  R pleural effusion P:   Full vent support Maintain O2 sats >92% Scheduled bronchodilator therapy VAP Bundle   CARDIOVASCULAR A:  PEA arrest likely secondary to severe cardiomyopathy  Chronic AF - now with RVR Cardiogenic greater than Septic shock P:  Prn Vasopressin, Levophed, and Phenylephrine gtts as needed to maintain MAP goal >65 mmHg Continue amiodarone gtt  Trend troponin's   RENAL A:   AKI, oliguric Mild metabolic acidosis P:   BMET q4hrs while on CRRT ultrafiltration initiated 07/5 Monitor I/Os Correct electrolytes, hypomagnesemia, hypophosphatemia  GASTROINTESTINAL A:   Diarrhea-occult positive  P:   Protonix for PUD prophylaxis Continue TF's  HEMATOLOGIC A:   Anemia Coagulopathy Elevated liver enzymes likely secondary to shock and hepatic congestion Thrombocytopenia Chronic anticoagulation with Pradaxa P:  Holding Pradaxa Monitor DIC panel and coags No chemical anticoagulation  Trend CBC Monitor for s/sx of bleeding   INFECTIOUS A:   Elevated PCT-worsening   Severe sepsis Presumed LE cellulitis and possible superimposed pneumonia  P:   Monitor temp, WBC count Micro and abx as above Trend PCT's  ENDOCRINE A:   Hyperglycemia, resolved Episodic hypoglycemia P:   Monitor glu on chem panels Consider SSI for glu > 180  NEUROLOGIC A:   Mechanical Intubation  P:   RASS goal: 0 to -1 Fentanyl gtt to maintain RASS goal and for pain  management  WUA daily   FAMILY  - Updates: Husband updated in detail regarding plan of care 04/13/17   Hermelinda Dellen, D.O.

## 2017-04-14 NOTE — Progress Notes (Signed)
Fentanyl gtt off at 0730. Pt able to follow commands weakly. She denies pain. Nods head  yes/no to questions. Remains drowsy. Vasopressin gtt weaned of at 1330. CRRT runs without problem. UF 74ml. No uop. BUN and creat WNL. Liver enzymes decreasing but remain high. No evidence of bleeding. On vent 35% fiO2, rate 16, 5 peep, TV 500. Sats usually 100%. Secretions scant . Lungs clear and diminished. Pt husband and daughter here most of day. Questions answered by myself or Dr Jefferson Fuel.

## 2017-04-14 NOTE — Progress Notes (Signed)
Patient remained off Levo and Neo this shift. Did have a few readings with MAP below 65 but corrected without adding any medications to the Vasopressin. Did have to be medicated x 1 with a Fentanyl bolus due to agitation when patient was having her bath. Reached up to pull at tube, and kept slapping staff hands away with care. Safety mitts applied.  Does not tolerate head being lowered. Sats drops to low 80s when flat. Continue on TF, tolerating well. Little to no secretions. On CRRT, changed cartridge x 1 and bags x 2 this shift.Wound on left leg dressed x 2 due to excessive weeping from the left lower leg. Preavlon boots in place and legs elevated, as well as bilateral arms. Rectal tube in place, Foley intact but no urine output this shift.

## 2017-04-14 NOTE — Progress Notes (Signed)
MEDICATION RELATED CONSULT NOTE - INITIAL   Pharmacy Consult for Electrolytes Indication: Low phos  No Known Allergies  Patient Measurements: Height: 5\' 5"  (165.1 cm) Weight: 199 lb 8.3 oz (90.5 kg) IBW/kg (Calculated) : 57   Vital Signs: Temp: 97.4 F (36.3 C) (07/07 2000) Temp Source: Axillary (07/07 2000) BP: 122/80 (07/07 2030) Pulse Rate: 104 (07/07 2000) Intake/Output from previous day: 07/06 0701 - 07/07 0700 In: 2680.8 [I.V.:1446.8; NG/GT:1034; IV Piggyback:200] Out: 1190 [Urine:15] Intake/Output from this shift: Total I/O In: 56.7 [I.V.:16.7; NG/GT:40] Out: 97 [Other:97]  Labs:  Recent Labs  04/12/17 0450  04/12/17 1852  04/13/17 0427 04/13/17 0753  04/14/17 0523 04/14/17 0751 04/14/17 1422 04/14/17 2007  WBC 12.7*  --  14.1*  --   --  19.0*  --  15.4*  --   --   --   HGB 7.1*  --  7.9*  --   --  8.1*  --  7.4*  --   --   --   HCT 22.3*  --  24.3*  --   --  25.0*  --  22.2*  --   --   --   PLT 37*  --  103*  --   --  81*  --  43*  --   --   --   APTT 74*  --   --   --  51*  --   --  46*  --   --   --   CREATININE 0.65  < >  --   < > 0.63  --   < >  --  0.62 0.57 0.52  MG 2.2  < >  --   < > 1.7  --   < >  --  1.6* 1.9 1.8  PHOS 2.7  < >  --   < > 2.1*  --   < > 1.1* <1.0* 2.0* 1.5*  ALBUMIN 3.5  < >  --   < > 3.4*  --   < >  --  2.8* 2.7*  2.8* 2.7*  PROT 5.2*  --   --   --  5.3*  --   --   --   --  4.7*  --   AST 3,323*  --   --   --  2,736*  --   --   --   --  762*  --   ALT 2,160*  --   --   --  2,237*  --   --   --   --  1,297*  --   ALKPHOS 304*  --   --   --  395*  --   --   --   --  482*  --   BILITOT 4.7*  --   --   --  7.0*  --   --   --   --  5.8*  --   BILIDIR  --   --   --   --   --   --   --   --   --  3.1*  --   IBILI  --   --   --   --   --   --   --   --   --  2.7*  --   < > = values in this interval not displayed. Estimated Creatinine Clearance: 77.9 mL/min (by C-G formula based on SCr of 0.52 mg/dL).  Assessment: 66 yo female on  CRRT with  phos <1.0 this AM. Pharmacy consult for electrolytes. Per discussion with RN, plan to continue on CRRT today. Renal MD note also states to continue CVVHD.  K 3.5, Mag 1.6, Phos <1.0  Goal of Therapy:  Electrolytes WNL  Plan:  Mag sulfate 2 g IV x1 KPhos 30 mmol IV x1 Recheck Phos (and K) at 2200 Renal function panel and magnesium level ordered q6h - may need to order labs for AM based on timing of supplementation.  7/7 2008 K 4.1, Mg 1.8, phos 1.5. CCMP orders potassium phosphate 30 mmol IV x 1. Will recheck electrolytes with AM labs.  Pharmacy will continue to follow.   Laural Benes, Pharm.D., BCPS Clinical Pharmacist 04/14/2017,9:26 PM

## 2017-04-14 NOTE — Progress Notes (Signed)
MEDICATION RELATED CONSULT NOTE - INITIAL   Pharmacy Consult for Electrolytes Indication: Low phos  No Known Allergies  Patient Measurements: Height: 5\' 5"  (165.1 cm) Weight: 199 lb 8.3 oz (90.5 kg) IBW/kg (Calculated) : 57   Vital Signs: Temp: 97.5 F (36.4 C) (07/07 0725) Temp Source: Axillary (07/07 0725) BP: 108/88 (07/07 0900) Pulse Rate: 106 (07/07 0800) Intake/Output from previous day: 07/06 0701 - 07/07 0700 In: 2680.8 [I.V.:1446.8; NG/GT:1034; IV Piggyback:200] Out: 1190 [Urine:15] Intake/Output from this shift: Total I/O In: 136.1 [I.V.:56.1; NG/GT:80] Out: 47 [Other:47]  Labs:  Recent Labs  04/11/17 1639  04/12/17 0450  04/12/17 1852  04/13/17 0427 04/13/17 0753  04/13/17 2002 04/14/17 0159 04/14/17 0523 04/14/17 0751  WBC 10.2  < > 12.7*  --  14.1*  --   --  19.0*  --   --   --  15.4*  --   HGB 6.8*  < > 7.1*  --  7.9*  --   --  8.1*  --   --   --  7.4*  --   HCT 21.3*  < > 22.3*  --  24.3*  --   --  25.0*  --   --   --  22.2*  --   PLT 31*  < > 37*  --  103*  --   --  81*  --   --   --  43*  --   APTT  --   < > 74*  --   --   --  51*  --   --   --   --  46*  --   CREATININE 1.06*  < > 0.65  < >  --   < > 0.63  --   < > 0.44 0.63  --  0.62  MG 1.8  < > 2.2  < >  --   < > 1.7  --   < > 1.8 1.7  --  1.6*  PHOS 4.7*  < > 2.7  < >  --   < > 2.1*  --   < > 1.4* 1.1* 1.1* <1.0*  ALBUMIN 3.1*  < > 3.5  < >  --   < > 3.4*  --   < > 3.0* 2.9*  --  2.8*  PROT 4.6*  --  5.2*  --   --   --  5.3*  --   --   --   --   --   --   AST 1,174*  --  3,323*  --   --   --  2,736*  --   --   --   --   --   --   ALT 823*  --  2,160*  --   --   --  2,237*  --   --   --   --   --   --   ALKPHOS 214*  --  304*  --   --   --  395*  --   --   --   --   --   --   BILITOT 3.3*  --  4.7*  --   --   --  7.0*  --   --   --   --   --   --   < > = values in this interval not displayed. Estimated Creatinine Clearance: 77.9 mL/min (by C-G formula based on SCr of 0.62  mg/dL).  Assessment: 66 yo female  on CRRT with phos <1.0 this AM. Pharmacy consult for electrolytes. Per discussion with RN, plan to continue on CRRT today. Renal MD note also states to continue CVVHD.  K 3.5, Mag 1.6, Phos <1.0  Goal of Therapy:  Electrolytes WNL  Plan:  Mag sulfate 2 g IV x1 KPhos 30 mmol IV x1 Recheck Phos (and K) at 2200 Renal function panel and magnesium level ordered q6h - may need to order labs for AM based on timing of supplementation.  Pharmacy will continue to follow.   Rayna Sexton L 04/14/2017,9:58 AM

## 2017-04-15 ENCOUNTER — Inpatient Hospital Stay: Payer: Medicare Other

## 2017-04-15 LAB — CBC WITH DIFFERENTIAL/PLATELET
BASOS PCT: 0 %
Basophils Absolute: 0 10*3/uL (ref 0–0.1)
EOS PCT: 0 %
Eosinophils Absolute: 0 10*3/uL (ref 0–0.7)
HEMATOCRIT: 22.4 % — AB (ref 35.0–47.0)
HEMOGLOBIN: 7.4 g/dL — AB (ref 12.0–16.0)
LYMPHS PCT: 9 %
Lymphs Abs: 2.5 10*3/uL (ref 1.0–3.6)
MCH: 29.9 pg (ref 26.0–34.0)
MCHC: 33 g/dL (ref 32.0–36.0)
MCV: 90.7 fL (ref 80.0–100.0)
MONOS PCT: 1 %
Monocytes Absolute: 0.3 10*3/uL (ref 0.2–0.9)
NEUTROS PCT: 90 %
Neutro Abs: 24.8 10*3/uL — ABNORMAL HIGH (ref 1.4–6.5)
Platelets: 42 10*3/uL — ABNORMAL LOW (ref 150–440)
RBC: 2.47 MIL/uL — AB (ref 3.80–5.20)
RDW: 18.2 % — ABNORMAL HIGH (ref 11.5–14.5)
WBC: 27.6 10*3/uL — AB (ref 3.6–11.0)

## 2017-04-15 LAB — RENAL FUNCTION PANEL
ALBUMIN: 2.7 g/dL — AB (ref 3.5–5.0)
ALBUMIN: 2.5 g/dL — AB (ref 3.5–5.0)
ALBUMIN: 2.4 g/dL — AB (ref 3.5–5.0)
ANION GAP: 5 (ref 5–15)
ANION GAP: 5 (ref 5–15)
Albumin: 2.6 g/dL — ABNORMAL LOW (ref 3.5–5.0)
Anion gap: 7 (ref 5–15)
Anion gap: 7 (ref 5–15)
BUN: 22 mg/dL — ABNORMAL HIGH (ref 6–20)
BUN: 23 mg/dL — AB (ref 6–20)
BUN: 24 mg/dL — AB (ref 6–20)
BUN: 25 mg/dL — AB (ref 6–20)
CALCIUM: 8.1 mg/dL — AB (ref 8.9–10.3)
CHLORIDE: 102 mmol/L (ref 101–111)
CHLORIDE: 100 mmol/L — AB (ref 101–111)
CHLORIDE: 100 mmol/L — AB (ref 101–111)
CO2: 27 mmol/L (ref 22–32)
CO2: 26 mmol/L (ref 22–32)
CO2: 26 mmol/L (ref 22–32)
CO2: 27 mmol/L (ref 22–32)
CREATININE: 0.41 mg/dL — AB (ref 0.44–1.00)
CREATININE: 0.36 mg/dL — AB (ref 0.44–1.00)
Calcium: 8 mg/dL — ABNORMAL LOW (ref 8.9–10.3)
Calcium: 8 mg/dL — ABNORMAL LOW (ref 8.9–10.3)
Calcium: 7.7 mg/dL — ABNORMAL LOW (ref 8.9–10.3)
Chloride: 103 mmol/L (ref 101–111)
Creatinine, Ser: 0.56 mg/dL (ref 0.44–1.00)
Creatinine, Ser: 0.53 mg/dL (ref 0.44–1.00)
GFR calc Af Amer: >60 mL/min (ref >60)
GFR calc Af Amer: >60 mL/min (ref >60)
GFR calc Af Amer: >60 mL/min (ref >60)
GFR calc non Af Amer: >60 mL/min (ref >60)
GLUCOSE: 138 mg/dL — AB (ref 65–99)
GLUCOSE: 230 mg/dL — AB (ref 65–99)
Glucose, Bld: 128 mg/dL — ABNORMAL HIGH (ref 65–99)
Glucose, Bld: 109 mg/dL — ABNORMAL HIGH (ref 65–99)
PHOSPHORUS: 2.7 mg/dL (ref 2.5–4.6)
PHOSPHORUS: 2.1 mg/dL — AB (ref 2.5–4.6)
POTASSIUM: 4.3 mmol/L (ref 3.5–5.1)
POTASSIUM: 4.9 mmol/L (ref 3.5–5.1)
POTASSIUM: 4.6 mmol/L (ref 3.5–5.1)
Phosphorus: 2.2 mg/dL — ABNORMAL LOW (ref 2.5–4.6)
Phosphorus: 3.3 mg/dL (ref 2.5–4.6)
Potassium: 4.5 mmol/L (ref 3.5–5.1)
SODIUM: 135 mmol/L (ref 135–145)
Sodium: 135 mmol/L (ref 135–145)
Sodium: 133 mmol/L — ABNORMAL LOW (ref 135–145)
Sodium: 132 mmol/L — ABNORMAL LOW (ref 135–145)

## 2017-04-15 LAB — HEPATIC FUNCTION PANEL
ALT: 1172 U/L — ABNORMAL HIGH (ref 14–54)
AST: 469 U/L — ABNORMAL HIGH (ref 15–41)
Albumin: 2.8 g/dL — ABNORMAL LOW (ref 3.5–5.0)
Alkaline Phosphatase: 606 U/L — ABNORMAL HIGH (ref 38–126)
Bilirubin, Direct: 2.9 mg/dL — ABNORMAL HIGH (ref 0.1–0.5)
Indirect Bilirubin: 2.5 mg/dL — ABNORMAL HIGH (ref 0.3–0.9)
Total Bilirubin: 5.4 mg/dL — ABNORMAL HIGH (ref 0.3–1.2)
Total Protein: 4.9 g/dL — ABNORMAL LOW (ref 6.5–8.1)

## 2017-04-15 LAB — GLUCOSE, CAPILLARY
Glucose-Capillary: 111 mg/dL — ABNORMAL HIGH (ref 65–99)
Glucose-Capillary: 125 mg/dL — ABNORMAL HIGH (ref 65–99)
Glucose-Capillary: 128 mg/dL — ABNORMAL HIGH (ref 65–99)
Glucose-Capillary: 105 mg/dL — ABNORMAL HIGH (ref 65–99)
Glucose-Capillary: 111 mg/dL — ABNORMAL HIGH (ref 65–99)
Glucose-Capillary: 119 mg/dL — ABNORMAL HIGH (ref 65–99)

## 2017-04-15 LAB — APTT
APTT: 39 s — AB (ref 24–36)
aPTT: 42 seconds — ABNORMAL HIGH (ref 24–36)
aPTT: 108 seconds — ABNORMAL HIGH (ref 24–36)

## 2017-04-15 LAB — PROTIME-INR
INR: 1.49
PROTHROMBIN TIME: 18.2 s — AB (ref 11.4–15.2)

## 2017-04-15 LAB — CBC
HEMATOCRIT: 24 % — AB (ref 35.0–47.0)
HEMOGLOBIN: 8 g/dL — AB (ref 12.0–16.0)
MCH: 30.3 pg (ref 26.0–34.0)
MCHC: 33.1 g/dL (ref 32.0–36.0)
MCV: 91.4 fL (ref 80.0–100.0)
Platelets: 50 10*3/uL — ABNORMAL LOW (ref 150–440)
RBC: 2.63 MIL/uL — ABNORMAL LOW (ref 3.80–5.20)
RDW: 17.8 % — AB (ref 11.5–14.5)
WBC: 25.9 10*3/uL — ABNORMAL HIGH (ref 3.6–11.0)

## 2017-04-15 LAB — MAGNESIUM
MAGNESIUM: 1.6 mg/dL — AB (ref 1.7–2.4)
MAGNESIUM: 1.6 mg/dL — AB (ref 1.7–2.4)
MAGNESIUM: 1.9 mg/dL (ref 1.7–2.4)
MAGNESIUM: 1.8 mg/dL (ref 1.7–2.4)

## 2017-04-15 MED ORDER — LORAZEPAM 2 MG/ML IJ SOLN
INTRAMUSCULAR | Status: AC
  Filled 2017-04-15: qty 1

## 2017-04-15 MED ORDER — HEPARIN (PORCINE) IN NACL 100-0.45 UNIT/ML-% IJ SOLN
1100.0000 [IU]/h | INTRAMUSCULAR | Status: DC
  Administered 2017-04-15: 1100 [IU]/h via INTRAVENOUS
  Filled 2017-04-15: qty 250

## 2017-04-15 MED ORDER — SODIUM PHOSPHATES 45 MMOLE/15ML IV SOLN
30.0000 mmol | Freq: Once | INTRAVENOUS | Status: AC
  Administered 2017-04-16: 30 mmol via INTRAVENOUS
  Filled 2017-04-15: qty 10

## 2017-04-15 MED ORDER — POTASSIUM PHOSPHATES 15 MMOLE/5ML IV SOLN
15.0000 mmol | Freq: Once | INTRAVENOUS | Status: AC
  Administered 2017-04-15: 15 mmol via INTRAVENOUS
  Filled 2017-04-15: qty 5

## 2017-04-15 MED ORDER — MAGNESIUM SULFATE 2 GM/50ML IV SOLN
INTRAVENOUS | Status: AC
  Filled 2017-04-15: qty 50

## 2017-04-15 MED ORDER — MAGNESIUM SULFATE 2 GM/50ML IV SOLN
2.0000 g | Freq: Once | INTRAVENOUS | Status: AC
  Administered 2017-04-15: 2 g via INTRAVENOUS

## 2017-04-15 MED ORDER — LORAZEPAM 2 MG/ML IJ SOLN
1.0000 mg | INTRAMUSCULAR | Status: DC | PRN
  Administered 2017-04-15: 1 mg via INTRAVENOUS

## 2017-04-15 NOTE — Progress Notes (Signed)
Pt has been responsive and nodding yes and no when awake. Drowsy on and off, but slept most of the shift. Tolerating vent even though patient occasionally bites down on the tube. Continues to do well off sedation. Only on amiodarone gtt. Continue on IV ABX. No s/s pain or discomfort. Hgb back up to 8.0. Turned and repositioned for comfort. Pink foam on bottom changed. Rectal tube in place. Foley intact, no urine output, but bladder scan at beginning of the shift showed 0 ml in the bladder. Continue to monitor.

## 2017-04-15 NOTE — Progress Notes (Signed)
Pharmacy Antibiotic Note/CRRT Dose Adjustment  [name] is a 66 y.o. female admitted on 04/08/2017 with progressive edema with bullae and cellulitis and anasarca.  Pharmacy has been consulted for Zosyn dosing. Patient is currently requiring CRRT.   Plan: 1. Day 6- Zosyn 3.375 g EI q 8 hours.   Day 3-Vancomycin-Will resume vancomycin per ID recommendation at 1500 mg iv once then 1000 mg iv q 24 hours. Will check a level prior to the third dose with a goal of 15-20 mcg/ml.    2. No medications require adjustment for CRRT at present. Will continue to monitor.     Height: 5\' 5"  (165.1 cm) Weight: 202 lb 2.6 oz (91.7 kg) IBW/kg (Calculated) : 57  Temp (24hrs), Avg:97.4 F (36.3 C), Min:97.4 F (36.3 C), Max:97.6 F (36.4 C)   Recent Labs Lab 04/09/17 0003 04/09/17 0423 04/09/17 1710  04/10/17 0533  04/12/17 0450  04/12/17 1852  04/13/17 0753  04/14/17 0159 04/14/17 0523 04/14/17 0751 04/14/17 1422 04/14/17 2007 04/15/17 0201 04/15/17 0500  WBC 12.9* 13.1*  --   --   --   < > 12.7*  --  14.1*  --  19.0*  --   --  15.4*  --   --   --   --  25.9*  CREATININE 2.18* 2.01* 2.07*  2.28*  < >  --   < > 0.65  < >  --   < >  --   < > 0.63  --  0.62 0.57 0.52 0.56  --   LATICACIDVEN 4.3* 5.0*  --   --  2.7*  --   --   --   --   --   --   --   --   --   --   --   --   --   --   VANCORANDOM  --   --  18  --   --   --   --   --   --   --   --   --   --   --   --   --   --   --   --   < > = values in this interval not displayed.  Estimated Creatinine Clearance: 78.5 mL/min (by C-G formula based on SCr of 0.56 mg/dL).    No Known Allergies  Antimicrobials this admission: Vancomycin 7/1 >> 7/2,  7/6 >> Zosyn 7/1 >> 7/2, 7/3 >>  Dose adjustments this admission:  Microbiology results: Wound Cx: NGTD UCx: NG BCx: NGTD GI PCR: negative C diff: negative MRSA PCR: negative  Thank you for allowing pharmacy to be a part of this patient's care.  [name] A 04/15/2017 7:45  AM

## 2017-04-15 NOTE — Progress Notes (Addendum)
Pulmonary/critical care  Called secondary to an abnormal upper extremity Doppler positive for left cephalic vein DVT. Unfortunately patient has been thrombocytopenic, anemic and coagulopathic in the past. Is an upper extremity clot so so filter is not a viable option will repeat CBC, PT, PTT, INR and make decision regarding low-dose anticoagulation without bolus. Discussed with son who understands.  Hermelinda Dellen, D.O.

## 2017-04-15 NOTE — Progress Notes (Signed)
Patient's labs reviewed with Dr. Jefferson Fuel. Patient found to have a left cephalic vein DVT. Labs show persistent thrombocytopenia and a downward trending hemoglobin. Patient is being treated for severe sepsis, septic shock and DIC. Will not anticoagulate for DVT due to persistent thrombocytopenia and DIC induced coagulopathy. Will continue to monitor   Case discussed with Dr. Marylouise Stacks. [facility] ANP-BC Pulmonary and Critical Care Medicine [facility] Pager [phone] or [phone]

## 2017-04-15 NOTE — Progress Notes (Signed)
[facility] NOTE   Pharmacy Consult for Electrolytes Indication: Low phos  No Known Allergies  Patient Measurements: Height: 5\' 5"  (165.1 cm) Weight: 202 lb 2.6 oz (91.7 kg) IBW/kg (Calculated) : 57   Vital Signs: Temp: 97.6 F (36.4 C) (07/08 0646) Temp Source: Axillary (07/08 0646) BP: 95/69 (07/08 0900) Pulse Rate: 101 (07/08 0900) Intake/Output from previous day: 07/07 0701 - 07/08 0700 In: 2835.5 [I.V.:455.5; NG/GT:960; IV Piggyback:1420] Out: 1456 [Stool:300] Intake/Output from this shift: Total I/O In: -  Out: 48 [Other:48]  Labs:  Recent Labs  04/13/17 0427 04/13/17 0753  04/14/17 0523  04/14/17 1422 04/14/17 2007 04/14/17 2245 04/15/17 0201 04/15/17 0500 04/15/17 0820  WBC  --  19.0*  --  15.4*  --   --   --   --   --  25.9*  --   HGB  --  8.1*  --  7.4*  --   --   --   --   --  8.0*  --   HCT  --  25.0*  --  22.2*  --   --   --   --   --  24.0*  --   PLT  --  81*  --  43*  --   --   --   --   --  50*  --   APTT 51*  --   --  46*  --   --   --   --   --  39*  --   CREATININE 0.63  --   < >  --   < > 0.57 0.52  --  0.56  --  0.41*  MG 1.7  --   < >  --   < > 1.9 1.8  --  1.6*  --  1.6*  PHOS 2.1*  --   < > 1.1*  < > 2.0* 1.5* 2.1* 2.7  --  2.2*  ALBUMIN 3.4*  --   < >  --   < > 2.7*  2.8* 2.7*  --  2.7* 2.8* 2.6*  PROT 5.3*  --   --   --   --  4.7*  --   --   --  4.9*  --   AST 2,736*  --   --   --   --  762*  --   --   --  469*  --   ALT 2,237*  --   --   --   --  1,297*  --   --   --  1,172*  --   ALKPHOS 395*  --   --   --   --  482*  --   --   --  606*  --   BILITOT 7.0*  --   --   --   --  5.8*  --   --   --  5.4*  --   BILIDIR  --   --   --   --   --  3.1*  --   --   --  2.9*  --   IBILI  --   --   --   --   --  2.7*  --   --   --  2.5*  --   < > = values in this interval not displayed. Estimated Creatinine Clearance: 78.5 mL/min (A) (by C-G formula based on SCr of 0.41 mg/dL (L)).  Assessment: 66 yo female on CRRT with phos  <  1.0 this AM. Pharmacy consult for electrolytes. Per discussion with RN, plan to continue on CRRT today. Renal MD note also states to continue CVVHD.  K 3.5, Mag 1.6, Phos <1.0  Goal of Therapy:  Electrolytes WNL  Plan:  Mag sulfate 2 g IV x1 KPhos 30 mmol IV x1 Recheck Phos (and K) at 2200 Renal function panel and magnesium level ordered q6h - may need to order labs for AM based on timing of supplementation.  7/7 2008 K 4.1, Mg 1.8, phos 1.5. CCMP orders potassium phosphate 30 mmol IV x 1. Will recheck electrolytes with AM labs.  7/8 0820 labs: K 4.3, Mag 1.6, Phos 2.2. Dr.Conforti ordered Magnesium sulfate 2 gm IV x1. Will order Potassium Phosphate 15 mmol IV x 1. Patient on CRRT. Will f/u electrolytes with evening (~2000)labs.   Pharmacy will continue to follow.   Anjannette Gauger A, Pharm.D., BCPS Clinical Pharmacist 04/15/2017,9:43 AM

## 2017-04-15 NOTE — Progress Notes (Signed)
On Neo gtt at 66mcg added at.1445.  Heparin at 1100u/hr added at 1700 for DVT LUA. Fentanyl gtt at 25mg  . Amiodarone at 30mg /hr. CRRT UF was decreased from 46ml to 80ml due to BP requiring addition of pressor. Lungs more rhonci today.  Secretions from ETT brown bloody thick. WBC 27.6 toay. No fevers. Sputum cx and one set blood cx from line obtained.  Lab unable to get second blood cx.  CRRT with some problems running at optimal blood flow rate after filter changed. Pt still nods to questions and follows a few commands. She bites ETT at times. She resists oral care. Husband here all day and updated by Dr Jefferson Fuel, Dr Juleen China.

## 2017-04-15 NOTE — Progress Notes (Addendum)
Fentanyl gtt off for 24 hours. Pt more alert today. Follows commands. Nods yes/no to questions. CAM positve on shift assessment  More aware of tubes with frequent cough, gagging and biting ett. She denies pain. Ativan 1mg  for anxiety was tried. Pt continued to bite on ETT  RT unable to place bite block. Low dose fentanyl gtt started.

## 2017-04-15 NOTE — Progress Notes (Signed)
Doppler studies completed UE's. Suspect DVT left UA above iv site. Peripheral site Dc'd. Pt more sedated on fentanyl gtt. MAP trending 64-66. Fentanyl gtt turned off for trial.

## 2017-04-15 NOTE — Progress Notes (Signed)
[facility] Progress Note Patient Name: [name] DOB: 1951/07/27 MRN: [id number]   Date of Service  04/15/2017  HPI/Events of Note  L Brachiocephalic vein thrombosis. INR = 1.49 and Platelets = 42K. She has been oozing from IV sites and has had bloody subglottic secretions.   eICU Interventions  Will order: 1. Heparin IV infusion without bolus. Keep on low end of therapeutic range. Will watch for increased bleeding.      Intervention Category Major Interventions: Other:  [name],[name] Cornelia Copa 04/15/2017, 4:20 PM

## 2017-04-15 NOTE — Progress Notes (Signed)
Patient ID: [name], female   DOB: 08/19/1951, 66 y.o.   MRN: [id number]   [facility] PROGRESS NOTE  [name] HBZ:[id number] DOB: [birth year] DOA: 04/08/2017 PCP: Tracie Harrier, MD  HPI/Subjective: Patient seen this morning and was intubated and sedated at the time of evaluation  Objective: Vitals:   04/15/17 1225 04/15/17 1300  BP: (!) 91/57 (!) 98/57  Pulse: 97 94  Resp: 17 14  Temp:      Filed Weights   04/13/17 0431 04/14/17 0500 04/15/17 0500  Weight: 87.9 kg (193 lb 12.6 oz) 90.5 kg (199 lb 8.3 oz) 91.7 kg (202 lb 2.6 oz)    ROS: Review of Systems  Unable to perform ROS: Intubated    Exam: Physical Exam  Constitutional: She is intubated.  HENT:  Head: Atraumatic.  Nose: No mucosal edema.  Eyes: Lids are normal. Pupils are equal, round, and reactive to light.  Neck: Carotid bruit is not present. No thyromegaly present.  Cardiovascular: Regular rhythm, S1 normal, S2 normal and normal heart sounds.   Respiratory: She is intubated. She has decreased breath sounds in the right lower field and the left lower field. She has no wheezes. She has rhonchi in the right lower field, the left middle field and the left lower field. She has no rales.  GI: Soft. Bowel sounds are normal. There is no tenderness.  Musculoskeletal:       Right ankle: She exhibits swelling.       Left ankle: She exhibits swelling.  Lymphadenopathy:    She has no cervical adenopathy.  Neurological:  Intubated and sedated today  Skin: Skin is warm. No cyanosis.  Left lower extremity: Anterior shin large area of blister that popped with underlying erythema. Left foot large area of blister that popped with underlying erythema. Some smaller areas of blisters that popped with underlying erythema. Right lower extremity covered. Bruising right neck and left thigh  Psychiatric:  Intubated and sedated this morning      Data Reviewed: Basic Metabolic Panel:  Recent Labs Lab 04/14/17 0751  04/14/17 1422 04/14/17 2007 04/14/17 2245 04/15/17 0201 04/15/17 0820  NA 136 137 136  --  135 135  K 3.5 3.9 4.1 4.2 4.5 4.3  CL 104 104 104  --  103 102  CO2 _0 --  27 26  GLUCOSE 146* 139* 111*  --  128* 109*  BUN 18 19 21*  --  22* 23*  CREATININE 0.62 0.57 0.52  --  0.56 0.41*  CALCIUM 8.1* 8.1* 8.1*  --  8.1* 8.0*  MG 1.6* 1.9 1.8  --  1.6* 1.6*  PHOS <1.0* 2.0* 1.5* 2.1* 2.7 2.2*   Liver Function Tests:  Recent Labs Lab 04/11/17 1639  04/12/17 0450  04/13/17 0427  04/14/17 1422 04/14/17 2007 04/15/17 0201 04/15/17 0500 04/15/17 0820  AST 1,174*  --  3,323*  --  2,736*  --  762*  --   --  469*  --   ALT 823*  --  2,160*  --  2,237*  --  1,297*  --   --  1,172*  --   ALKPHOS 214*  --  304*  --  395*  --  482*  --   --  606*  --   BILITOT 3.3*  --  4.7*  --  7.0*  --  5.8*  --   --  5.4*  --   PROT 4.6*  --  5.2*  --  5.3*  --  4.7*  --   --  4.9*  --   ALBUMIN 3.1*  < > 3.5  < > 3.4*  < > 2.7*  2.8* 2.7* 2.7* 2.8* 2.6*  < > = values in this interval not displayed. No results for input(s): LIPASE, AMYLASE in the last 168 hours. No results for input(s): AMMONIA in the last 168 hours. CBC:  Recent Labs Lab 04/11/17 1639 04/11/17 2145 04/12/17 0450 04/12/17 1852 04/13/17 0753 04/14/17 0523 04/15/17 0500  WBC 10.2 13.0* 12.7* 14.1* 19.0* 15.4* 25.9*  NEUTROABS 8.0* 12.0*  --  12.7* 17.3* 13.2*  --   HGB 6.8* 7.6* 7.1* 7.9* 8.1* 7.4* 8.0*  HCT 21.3* 23.2* 22.3* 24.3* 25.0* 22.2* 24.0*  MCV 96.7 96.7 96.4 93.5 91.6 89.9 91.4  PLT 31* 38* 37* 103* 81* 43* 50*   Cardiac Enzymes:  Recent Labs Lab 04/12/17 0732 04/12/17 1217  TROPONINI 0.09* 0.08*   BNP (last 3 results)  Recent Labs  04/08/17 0139  BNP 2,012.0*     CBG:  Recent Labs Lab 04/14/17 2351 04/15/17 0440 04/15/17 0510 04/15/17 0727 04/15/17 1231  GLUCAP 127* 125* 111* 128* 105*    Recent Results (from the past 240 hour(s))  Culture, blood (routine x 2)     Status: None    Collection Time: 04/08/17  2:04 AM  Result Value Ref Range Status   Specimen Description BLOOD RIGHT ANTECUBITAL  Final   Special Requests   Final    BOTTLES DRAWN AEROBIC AND ANAEROBIC Blood Culture adequate volume   Culture NO GROWTH 5 DAYS  Final   Report Status 04/13/2017 FINAL  Final  Culture, blood (routine x 2)     Status: None   Collection Time: 04/08/17  2:06 AM  Result Value Ref Range Status   Specimen Description BLOOD LEFT ANTECUBITAL  Final   Special Requests   Final    BOTTLES DRAWN AEROBIC AND ANAEROBIC Blood Culture adequate volume   Culture NO GROWTH 5 DAYS  Final   Report Status 04/13/2017 FINAL  Final  C difficile quick scan w PCR reflex     Status: None   Collection Time: 04/08/17  5:09 AM  Result Value Ref Range Status   C Diff antigen NEGATIVE NEGATIVE Final   C Diff toxin NEGATIVE NEGATIVE Final   C Diff interpretation No C. difficile detected.  Final  MRSA PCR Screening     Status: None   Collection Time: 04/08/17  6:50 AM  Result Value Ref Range Status   MRSA by PCR NEGATIVE NEGATIVE Final    Comment:        The GeneXpert MRSA Assay (FDA approved for NASAL specimens only), is one component of a comprehensive MRSA colonization surveillance program. It is not intended to diagnose MRSA infection nor to guide or monitor treatment for MRSA infections.   Urine culture     Status: None   Collection Time: 04/08/17 12:16 PM  Result Value Ref Range Status   Specimen Description URINE, RANDOM  Final   Special Requests NONE  Final   Culture   Final    NO GROWTH Performed at [facility], [address] [place], [place] [address]    Report Status 04/10/2017 FINAL  Final  Aerobic Culture (superficial specimen)     Status: None   Collection Time: 04/09/17 11:32 AM  Result Value Ref Range Status   Specimen Description SKIN  Final   Special Requests NONE  Final   Gram Stain  Final    RARE WBC PRESENT, PREDOMINANTLY PMN NO SQUAMOUS EPITHELIAL  CELLS SEEN NO ORGANISMS SEEN    Culture   Final    NO GROWTH 2 DAYS Performed at [facility], [address], [place], [place] [address]    Report Status 04/11/2017 FINAL  Final  Gastrointestinal Panel by PCR , Stool     Status: None   Collection Time: 04/09/17  5:10 PM  Result Value Ref Range Status   Campylobacter species NOT DETECTED NOT DETECTED Final   Plesimonas shigelloides NOT DETECTED NOT DETECTED Final   Salmonella species NOT DETECTED NOT DETECTED Final   Yersinia enterocolitica NOT DETECTED NOT DETECTED Final   Vibrio species NOT DETECTED NOT DETECTED Final   Vibrio cholerae NOT DETECTED NOT DETECTED Final   Enteroaggregative E coli (EAEC) NOT DETECTED NOT DETECTED Final   Enteropathogenic E coli (EPEC) NOT DETECTED NOT DETECTED Final   Enterotoxigenic E coli (ETEC) NOT DETECTED NOT DETECTED Final   Shiga like toxin producing E coli (STEC) NOT DETECTED NOT DETECTED Final   Shigella/Enteroinvasive E coli (EIEC) NOT DETECTED NOT DETECTED Final   Cryptosporidium NOT DETECTED NOT DETECTED Final   Cyclospora cayetanensis NOT DETECTED NOT DETECTED Final   Entamoeba histolytica NOT DETECTED NOT DETECTED Final   Giardia lamblia NOT DETECTED NOT DETECTED Final   Adenovirus F40/41 NOT DETECTED NOT DETECTED Final   Astrovirus NOT DETECTED NOT DETECTED Final   Norovirus GI/GII NOT DETECTED NOT DETECTED Final   Rotavirus A NOT DETECTED NOT DETECTED Final   Sapovirus (I, II, IV, and V) NOT DETECTED NOT DETECTED Final  Culture, respiratory (NON-Expectorated)     Status: None   Collection Time: 04/12/17 12:00 PM  Result Value Ref Range Status   Specimen Description TRACHEAL ASPIRATE  Final   Special Requests NONE  Final   Gram Stain   Final    MODERATE WBC PRESENT,BOTH PMN AND MONONUCLEAR MODERATE YEAST Performed at [facility], [address], [place], [place] [address]    Culture MODERATE CANDIDA TROPICALIS  Final   Report Status 04/14/2017 FINAL  Final      Studies: Dg Chest Port 1 View  Result Date: 04/14/2017 CLINICAL DATA:  Initial evaluation for acute respiratory failure. EXAM: PORTABLE CHEST 1 VIEW COMPARISON:  Prior radiograph from 04/13/2017. FINDINGS: Endotracheal tube in place with tip positioned 2.6 cm above the carina. Enteric tube courses in the the abdomen. Left IJ approach central venous catheter in place with tip overlying the distal SVC. Stable cardiomegaly. Mediastinal silhouette normal. Lungs hypoinflated. Persistent perihilar vascular congestion with interstitial prominence, suggesting underlying pulmonary edema. Superimposed bibasilar opacities, greatest within the mid and lower right lung, similar to previous. Persistent right pleural effusion. No pneumothorax. No acute osseous abnormality. IMPRESSION: 1. Support apparatus in satisfactory position as above. 2. Stable cardiomegaly with similar diffuse bilateral pulmonary infiltrates and/or edema and small right pleural effusion. No significant interval change. Electronically Signed   By: Jeannine Boga M.D.   On: 04/14/2017 06:28    Scheduled Meds: . chlorhexidine gluconate (MEDLINE KIT)  15 mL Mouth Rinse BID  . feeding supplement (PRO-STAT SUGAR FREE 64)  30 mL Per Tube BID  . feeding supplement (VITAL HIGH PROTEIN)  1,000 mL Per Tube Q24H  . ipratropium-albuterol  3 mL Nebulization Q6H  . levothyroxine  25 mcg Intravenous Daily  . mouth rinse  15 mL Mouth Rinse 10 times per day  . pantoprazole (PROTONIX) IV  40 mg Intravenous Q24H  .  sertraline  25 mg Oral Daily  . silver sulfADIAZINE   Topical BID   Continuous Infusions: . amiodarone 30 mg/hr (04/15/17 0750)  . fentaNYL infusion INTRAVENOUS 25 mcg/hr (04/15/17 0846)  . norepinephrine (LEVOPHED) Adult infusion Stopped (04/13/17 1045)  . phenylephrine (NEO-SYNEPHRINE) Adult infusion Stopped (04/13/17 1521)  . piperacillin-tazobactam (ZOSYN)  IV Stopped (04/15/17 1007)  . potassium phosphate IVPB (mmol) 15 mmol  (04/15/17 1029)  . pureflow 2,000 each (04/15/17 0105)  . vancomycin Stopped (04/14/17 1642)  . vasopressin (PITRESSIN) infusion - *FOR SHOCK* Stopped (04/14/17 1330)    Assessment/Plan:  1. Septic shock. Patient Was off pressors this morning but since being placed back on fentanyl blood pressure is low. Continue antibiotics as per infectious disease with vancomycin and Zosyn. Patient had lower extremity bullus cellulitis 2. PEA Cardiopulmonary arrest. Supportive care with ventilator at this time. Trials of cutting down sedation showed that she is following some simple commands as of yesterday. Had a be re-sedated because she got anxious on the ventilator was biting down on the tube. 3. Shock liver from cardiopulmonary arrest. Liver function trending better 4. 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 Patient still not making urine 5. Hypomagnesemia, hypophosphatemia and hypokalemia. Electrolyte protocol as per ICU 6. Atrial fibrillation on amiodarone for rate control. Pradaxa on hold. 7. Coagulopathy. Could be secondary to shock liver. 8. Diarrhea. Has rectal tube in. Stool studies are negative 9. Cardiomyopathy on echocardiogram. Blood pressure too low for any medications at this point 10. Thrombocytopenia could be secondary to sepsis. Continue to watch platelet count 11. Anemia- continue to watch hemoglobin closely 12. Leukocytosis. Elevation in white blood cell counts. Sputum cultures sent off by critical care specialist  Code Status:     Code Status Orders        Start     Ordered   04/08/17 0651  Full code  Continuous     04/08/17 0650    Code Status History    Date Active Date Inactive Code Status Order ID Comments User Context   This patient has a current code status but no historical code status.     Family Communication: Spoke with husband Yesterday Disposition Plan: To be determined based on clinical course  Consultants:  Critical care  specialist  Infectious disease  Nephrology  Palliative care  Cardiology  Antibiotics:  Vancomycin  Zosyn  Time spent: 22 minutes  [place], [place]

## 2017-04-15 NOTE — Progress Notes (Addendum)
PULMONARY / CRITICAL CARE MEDICINE   Name: [name] MRN: [id number] DOB: 09-15-1951    ADMISSION DATE:  04/08/2017  PT PROFILE:   66 y.o. F with PMH of CAF, hypothyroidism admitted via ED to ICU with many days of progressive LE edema, blistering lesions on BLE, progressive weakness. Adm diagnosis of septic shock, cellulitis, AKI.  Pt PEA arrested 07/4 ACLS protocol initiated ROSC within 2 minutes following interventions.   MAJOR EVENTS/TEST RESULTS: 07/01 LE venous US: no DVT 07/01 Echocardiogram:  LVEF 25-30% 07/01 Nephrology consultation 07/02 ID consultation 07/02 CRRT initiated 07/03 Off vasopressors. Worsening tachycardia - amiodarone initiated 07/04 Pt PEA arrest ACLS protocol initiated ROSC 2 minutes now back on vasopressors  07/05 Intubated, shock req high dose vasopressors, on CRRT, elevated LFTs c/w congestion/shock liver, pulmonary edema vs ARDS on CXR  INDWELLING DEVICES:: R fem HD cath 07/02 >>  ETT 07/04 >>  Left IJ 07/04 >>   MICRO DATA: MRSA PCR 07/01 >> NEG Urine 07/01 >> NEG Blood 07/01 >> NEG C diff 07/01 >> NEG GI panel 07/02 >> NEG Resp 07/03 >>   ANTIMICROBIALS:  Vanc 07/01 >> 07/02 Pip-tazo 07/01 >>   SUBJECTIVE:  Pt remains intubated. She has been weaned off of pressors, is more awake and alert. Briefly attempted spontaneous breathing trial with desaturation noted. Coarse rhonchi on auscultation and thick secretions.  VITAL SIGNS: BP (!) 141/95   Pulse (!) 115   Temp 97.6 F (36.4 C) (Axillary)   Resp 17   Ht 5\' 5"  (1.651 m)   Wt 91.7 kg (202 lb 2.6 oz)   SpO2 100%   BMI 33.64 kg/m   VENTILATOR SETTINGS: Vent Mode: PRVC FiO2 (%):  [35 %] 35 % Set Rate:  [16 bmp] 16 bmp Vt Set:  [500 mL] 500 mL PEEP:  [5 cmH20] 5 cmH20 Plateau Pressure:  [18 cmH20] 18 cmH20  INTAKE / OUTPUT: I/O last 3 completed shifts: In: 3731.5 [I.V.:821.5; NG/GT:1440; IV [id number]:0160] Out: 2053 [Other:1753; Stool:300]  PHYSICAL EXAMINATION: General:  acutely ill appearing Caucasian female, mechanically intubated  Neuro: sedated, able to nod head yes and no appropriately, PERRL  HEENT: supple, no JVD jaundice bilateral pupils Cardiovascular: IRIR, tachy, no M/R/G Lungs: crackles throughout, even, non labored; no wheezes   Abdomen: obese, soft, ND Ext: severe BLE edema from hips down, multiple bilateral bullous lesions  LABS:  BMET  Recent Labs Lab 04/14/17 1422 04/14/17 2007 04/14/17 2245 04/15/17 0201  NA 137 136  --  135  K 3.9 4.1 4.2 4.5  CL 104 104  --  103  CO2 25 26  --  27  BUN 19 21*  --  22*  CREATININE 0.57 0.52  --  0.56  GLUCOSE 139* 111*  --  128*    Electrolytes  Recent Labs Lab 04/14/17 1422 04/14/17 2007 04/14/17 2245 04/15/17 0201 04/15/17 0820  CALCIUM 8.1* 8.1*  --  8.1*  --   MG 1.9 1.8  --  1.6* 1.6*  PHOS 2.0* 1.5* 2.1* 2.7  --     CBC  Recent Labs Lab 04/13/17 0753 04/14/17 0523 04/15/17 0500  WBC 19.0* 15.4* 25.9*  HGB 8.1* 7.4* 8.0*  HCT 25.0* 22.2* 24.0*  PLT 81* 43* 50*    Coag's  Recent Labs Lab 04/09/17 0003 04/10/17 0534  04/11/17 1847  04/13/17 0427 04/14/17 0523 04/15/17 0500  APTT 113* 107*  < > 141*  < > 51* 46* 39*  INR 5.67* 2.65  --  4.27*  --   --   --   --   < > =  values in this interval not displayed.  Sepsis Markers  Recent Labs Lab 04/09/17 0003 04/09/17 0423  04/10/17 0533  04/12/17 0732 04/13/17 0427 04/14/17 0523  LATICACIDVEN 4.3* 5.0*  --  2.7*  --   --   --   --   PROCALCITON  --   --   < >  --   < > 11.38 12.33 16.09  < > = values in this interval not displayed.  ABG  Recent Labs Lab 04/11/17 1730 04/12/17 0500 04/12/17 0935  PHART 7.21* 7.29* 7.33*  PCO2ART 50* 35 37  PO2ART 55* 91 86    Liver Enzymes  Recent Labs Lab 04/13/17 0427  04/14/17 1422 04/14/17 2007 04/15/17 0201 04/15/17 0500  AST 2,736*  --  762*  --   --  469*  ALT 2,237*  --  1,297*  --   --  1,172*  ALKPHOS 395*  --  482*  --   --  606*   BILITOT 7.0*  --  5.8*  --   --  5.4*  ALBUMIN 3.4*  < > 2.7*  2.8* 2.7* 2.7* 2.8*  < > = values in this interval not displayed.  Cardiac Enzymes  Recent Labs Lab 04/12/17 0732 04/12/17 1217  TROPONINI 0.09* 0.08*    Glucose  Recent Labs Lab 04/14/17 1656 04/14/17 2008 04/14/17 2351 04/15/17 0440 04/15/17 0510 04/15/17 0727  GLUCAP 147* 107* 127* 125* 111* 128*    CXR: NSC moderate R pleural effusion  ASSESSMENT / PLAN:  Acute hypoxic respiratory failure secondary to pulmonary edema and possible superimposed pneumonia  Increasing white count, coarse rhonchi and thick secretions. Will obtain respiratory culture data and adjust antibiotics as indicated  PEA arrest likely secondary to severe cardiomyopathy   Chronic AF - on amiodarone  Renal failure. Continues on CRRT per nephrology   Anemia. No evidence of active bleeding at this time. Hemoglobin of 8  Transaminitis has improved, most likely consistent with shock liver  Thrombocytopenia. Platelet count of 50,000, no evidence of active bleeding  Cellulitis. On broad-spectrum coverage  Hypomagnesemia. We'll replace  FAMILY  Updates: Husband updated in detail regarding plan of care 04/13/17  Compared time 40 minutes   Hermelinda Dellen, D.O.

## 2017-04-15 NOTE — Progress Notes (Signed)
ANTICOAGULATION CONSULT NOTE - Initial Consult  Pharmacy Consult for heparin Indication: l brachiocephalic vein thrombosis  No Known Allergies  Patient Measurements: Height: 5\' 5"  (165.1 cm) Weight: 202 lb 2.6 oz (91.7 kg) IBW/kg (Calculated) : 57 Heparin Dosing Weight: 77 kg  Vital Signs: Temp: 97.5 F (36.4 C) (07/08 1100) Temp Source: Axillary (07/08 0646) BP: 111/69 (07/08 1540) Pulse Rate: 106 (07/08 1540)  Labs:  Recent Labs  04/13/17 0427  04/14/17 0523  04/15/17 0201 04/15/17 0500 04/15/17 0820 04/15/17 1352 04/15/17 1429  HGB  --   < > 7.4*  --   --  8.0*  --  7.4*  --   HCT  --   < > 22.2*  --   --  24.0*  --  22.4*  --   PLT  --   < > 43*  --   --  50*  --  42*  --   APTT 51*  --  46*  --   --  39*  --   --   --   LABPROT  --   --   --   --   --   --   --  18.2*  --   INR  --   --   --   --   --   --   --  1.49  --   CREATININE 0.63  < >  --   < > 0.56  --  0.41*  --  0.36*  < > = values in this interval not displayed.  Estimated Creatinine Clearance: 78.5 mL/min (A) (by C-G formula based on SCr of 0.36 mg/dL (L)).   Medical History: Past Medical History:  Diagnosis Date  . A-fib ([place])    on pradaxa  . Hypertension   . Thyroid disease     Medications:  Infusions:  . amiodarone 30 mg/hr (04/15/17 0750)  . fentaNYL infusion INTRAVENOUS Stopped (04/15/17 1341)  . heparin    . norepinephrine (LEVOPHED) Adult infusion Stopped (04/13/17 1045)  . phenylephrine (NEO-SYNEPHRINE) Adult infusion 60 mcg/min (04/15/17 1500)  . piperacillin-tazobactam (ZOSYN)  IV 3.375 g (04/15/17 1405)  . pureflow 3 each (04/15/17 1335)  . vancomycin Stopped (04/14/17 1642)  . vasopressin (PITRESSIN) infusion - *FOR SHOCK* Stopped (04/14/17 1330)    Assessment: 84 yof with left brachiocephalic vein thrombosis. 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 Requests we target lower end of therapeutic range (0.3 to 0.5)  Goal of Therapy:  Heparin  level 0.3 to 0.5 units/ml Monitor platelets by anticoagulation protocol: Yes   Plan:  Start heparin infusion at 1200 units/hr Check anti-Xa level in 6 hours and daily while on heparin Continue to monitor H&H and platelets  Laural Benes, Pharm.D., BCPS Clinical Pharmacist 04/15/2017,4:36 PM

## 2017-04-15 NOTE — Progress Notes (Signed)
[facility]  ROUNDING NOTE   Subjective:   Husband at bedside.  CRRT net +1463 UF 1156  Off all vasopressors.   Wbc 25.9 (15.4)  Objective:  Vital signs in last 24 hours:  Temp:  [97.4 F (36.3 C)-97.6 F (36.4 C)] 97.6 F (36.4 C) (07/08 0646) Pulse Rate:  [94-118] 101 (07/08 0900) Resp:  [12-24] 19 (07/08 0900) BP: (89-141)/(55-102) 95/69 (07/08 0900) SpO2:  [98 %-100 %] 100 % (07/08 0900) FiO2 (%):  [35 %] 35 % (07/08 0646) Weight:  [91.7 kg (202 lb 2.6 oz)] 91.7 kg (202 lb 2.6 oz) (07/08 0500)  Weight change: 1.2 kg (2 lb 10.3 oz) Filed Weights   04/13/17 0431 04/14/17 0500 04/15/17 0500  Weight: 87.9 kg (193 lb 12.6 oz) 90.5 kg (199 lb 8.3 oz) 91.7 kg (202 lb 2.6 oz)    Intake/Output: I/O last 3 completed shifts: In: 3731.5 [I.V.:821.5; NG/GT:1440; IV [id number]:7782] Out: 2053 [Other:1753; Stool:300]   Intake/Output this shift:  Total I/O In: -  Out: 48 [Other:48]  Physical Exam: General: Critically ill  Head: Normocephalic, atraumatic. ETT, OGT  Eyes: Anicteric, PERRL  Neck: Supple, trachea midline  Lungs:  Bilateral crackles, PRVC FiO2 35%  Heart: Irregular, tachycardia  Abdomen:  Soft, nontender, obese  Extremities: 1+ weeping bullae lower extremity edema. Bilateral dressings - clean and dry.   Neurologic: Intubated, sedated  Skin: Bullous lesions with serous anginous drainage  Access: Right femoral temp HD catheter Dr. Lucky Cowboy 7/2    Basic Metabolic Panel:  Recent Labs Lab 04/14/17 0751 04/14/17 1422 04/14/17 2007 04/14/17 2245 04/15/17 0201 04/15/17 0820  NA 136 137 136  --  135 135  K 3.5 3.9 4.1 4.2 4.5 4.3  CL 104 104 104  --  103 102  CO2 '26 25 26  ' --  27 26  GLUCOSE 146* 139* 111*  --  128* 109*  BUN 18 19 21*  --  22* 23*  CREATININE 0.62 0.57 0.52  --  0.56 0.41*  CALCIUM 8.1* 8.1* 8.1*  --  8.1* 8.0*  MG 1.6* 1.9 1.8  --  1.6* 1.6*  PHOS <1.0* 2.0* 1.5* 2.1* 2.7 2.2*    Liver Function Tests:  Recent Labs Lab  04/11/17 1639  04/12/17 0450  04/13/17 0427  04/14/17 1422 04/14/17 2007 04/15/17 0201 04/15/17 0500 04/15/17 0820  AST 1,174*  --  3,323*  --  2,736*  --  762*  --   --  469*  --   ALT 823*  --  2,160*  --  2,237*  --  1,297*  --   --  1,172*  --   ALKPHOS 214*  --  304*  --  395*  --  482*  --   --  606*  --   BILITOT 3.3*  --  4.7*  --  7.0*  --  5.8*  --   --  5.4*  --   PROT 4.6*  --  5.2*  --  5.3*  --  4.7*  --   --  4.9*  --   ALBUMIN 3.1*  < > 3.5  < > 3.4*  < > 2.7*  2.8* 2.7* 2.7* 2.8* 2.6*  < > = values in this interval not displayed. No results for input(s): LIPASE, AMYLASE in the last 168 hours. No results for input(s): AMMONIA in the last 168 hours.  CBC:  Recent Labs Lab 04/11/17 1639 04/11/17 2145 04/12/17 0450 04/12/17 1852 04/13/17 0753 04/14/17 0523 04/15/17 0500  WBC 10.2 13.0* 12.7* 14.1* 19.0* 15.4* 25.9*  NEUTROABS 8.0* 12.0*  --  12.7* 17.3* 13.2*  --   HGB 6.8* 7.6* 7.1* 7.9* 8.1* 7.4* 8.0*  HCT 21.3* 23.2* 22.3* 24.3* 25.0* 22.2* 24.0*  MCV 96.7 96.7 96.4 93.5 91.6 89.9 91.4  PLT 31* 38* 37* 103* 81* 43* 50*    Cardiac Enzymes:  Recent Labs Lab 04/12/17 0732 04/12/17 1217  TROPONINI 0.09* 0.08*    BNP: Invalid input(s): POCBNP  CBG:  Recent Labs Lab 04/14/17 2008 04/14/17 2351 04/15/17 0440 04/15/17 0510 04/15/17 0727  GLUCAP 107* 127* 125* 111* 128*    Microbiology: Results for orders placed or performed during the hospital encounter of 04/08/17  Culture, blood (routine x 2)     Status: None   Collection Time: 04/08/17  2:04 AM  Result Value Ref Range Status   Specimen Description BLOOD RIGHT ANTECUBITAL  Final   Special Requests   Final    BOTTLES DRAWN AEROBIC AND ANAEROBIC Blood Culture adequate volume   Culture NO GROWTH 5 DAYS  Final   Report Status 04/13/2017 FINAL  Final  Culture, blood (routine x 2)     Status: None   Collection Time: 04/08/17  2:06 AM  Result Value Ref Range Status   Specimen Description  BLOOD LEFT ANTECUBITAL  Final   Special Requests   Final    BOTTLES DRAWN AEROBIC AND ANAEROBIC Blood Culture adequate volume   Culture NO GROWTH 5 DAYS  Final   Report Status 04/13/2017 FINAL  Final  C difficile quick scan w PCR reflex     Status: None   Collection Time: 04/08/17  5:09 AM  Result Value Ref Range Status   C Diff antigen NEGATIVE NEGATIVE Final   C Diff toxin NEGATIVE NEGATIVE Final   C Diff interpretation No C. difficile detected.  Final  MRSA PCR Screening     Status: None   Collection Time: 04/08/17  6:50 AM  Result Value Ref Range Status   MRSA by PCR NEGATIVE NEGATIVE Final    Comment:        The GeneXpert MRSA Assay (FDA approved for NASAL specimens only), is one component of a comprehensive MRSA colonization surveillance program. It is not intended to diagnose MRSA infection nor to guide or monitor treatment for MRSA infections.   Urine culture     Status: None   Collection Time: 04/08/17 12:16 PM  Result Value Ref Range Status   Specimen Description URINE, RANDOM  Final   Special Requests NONE  Final   Culture   Final    NO GROWTH Performed at [facility], [address], [place], [place] [address]    Report Status 04/10/2017 FINAL  Final  Aerobic Culture (superficial specimen)     Status: None   Collection Time: 04/09/17 11:32 AM  Result Value Ref Range Status   Specimen Description SKIN  Final   Special Requests NONE  Final   Gram Stain   Final    RARE WBC PRESENT, PREDOMINANTLY PMN NO SQUAMOUS EPITHELIAL CELLS SEEN NO ORGANISMS SEEN    Culture   Final    NO GROWTH 2 DAYS Performed at [facility], [place] [address], [place], [place] [address]    Report Status 04/11/2017 FINAL  Final  Gastrointestinal Panel by PCR , Stool     Status: None   Collection Time: 04/09/17  5:10 PM  Result Value Ref Range Status   Campylobacter species NOT DETECTED NOT  DETECTED Final   Plesimonas shigelloides NOT DETECTED NOT DETECTED Final    Salmonella species NOT DETECTED NOT DETECTED Final   Yersinia enterocolitica NOT DETECTED NOT DETECTED Final   Vibrio species NOT DETECTED NOT DETECTED Final   Vibrio cholerae NOT DETECTED NOT DETECTED Final   Enteroaggregative E coli (EAEC) NOT DETECTED NOT DETECTED Final   Enteropathogenic E coli (EPEC) NOT DETECTED NOT DETECTED Final   Enterotoxigenic E coli (ETEC) NOT DETECTED NOT DETECTED Final   Shiga like toxin producing E coli (STEC) NOT DETECTED NOT DETECTED Final   Shigella/Enteroinvasive E coli (EIEC) NOT DETECTED NOT DETECTED Final   Cryptosporidium NOT DETECTED NOT DETECTED Final   Cyclospora cayetanensis NOT DETECTED NOT DETECTED Final   Entamoeba histolytica NOT DETECTED NOT DETECTED Final   Giardia lamblia NOT DETECTED NOT DETECTED Final   Adenovirus F40/41 NOT DETECTED NOT DETECTED Final   Astrovirus NOT DETECTED NOT DETECTED Final   Norovirus GI/GII NOT DETECTED NOT DETECTED Final   Rotavirus A NOT DETECTED NOT DETECTED Final   Sapovirus (I, II, IV, and V) NOT DETECTED NOT DETECTED Final  Culture, respiratory (NON-Expectorated)     Status: None   Collection Time: 04/12/17 12:00 PM  Result Value Ref Range Status   Specimen Description TRACHEAL ASPIRATE  Final   Special Requests NONE  Final   Gram Stain   Final    MODERATE WBC PRESENT,BOTH PMN AND MONONUCLEAR MODERATE YEAST Performed at [facility], [address] [place], [place] [address]    Culture MODERATE CANDIDA TROPICALIS  Final   Report Status 04/14/2017 FINAL  Final    Coagulation Studies: No results for input(s): LABPROT, INR in the last 72 hours.  Urinalysis: No results for input(s): COLORURINE, LABSPEC, PHURINE, GLUCOSEU, HGBUR, BILIRUBINUR, KETONESUR, PROTEINUR, UROBILINOGEN, NITRITE, LEUKOCYTESUR in the last 72 hours.  Invalid input(s): APPERANCEUR    Imaging: Dg Chest Port 1 View  Result Date: 04/14/2017 CLINICAL DATA:  Initial evaluation for acute respiratory failure. EXAM: PORTABLE  CHEST 1 VIEW COMPARISON:  Prior radiograph from 04/13/2017. FINDINGS: Endotracheal tube in place with tip positioned 2.6 cm above the carina. Enteric tube courses in the the abdomen. Left IJ approach central venous catheter in place with tip overlying the distal SVC. Stable cardiomegaly. Mediastinal silhouette normal. Lungs hypoinflated. Persistent perihilar vascular congestion with interstitial prominence, suggesting underlying pulmonary edema. Superimposed bibasilar opacities, greatest within the mid and lower right lung, similar to previous. Persistent right pleural effusion. No pneumothorax. No acute osseous abnormality. IMPRESSION: 1. Support apparatus in satisfactory position as above. 2. Stable cardiomegaly with similar diffuse bilateral pulmonary infiltrates and/or edema and small right pleural effusion. No significant interval change. Electronically Signed   By: Jeannine Boga M.D.   On: 04/14/2017 06:28   Dg Chest Port 1 View  Result Date: 04/13/2017 CLINICAL DATA:  Acute respiratory failure. EXAM: PORTABLE CHEST 1 VIEW COMPARISON:  04/12/2017 FINDINGS: Support apparatus is stable. The cardiac silhouette is enlarged. Mediastinal contours appear intact. There is no evidence of pneumothorax. Mixed pattern pulmonary edema versus pulmonary infiltrates have slightly improved. There is a stable right pleural effusion. Possible small left pleural effusion. Osseous structures are without acute abnormality. Soft tissues are grossly normal. IMPRESSION: Stable support apparatus. Slight improvement in mixed pattern pulmonary edema versus infiltrates in bilateral lungs. Probable bilateral pleural effusions. Electronically Signed   By: Fidela Salisbury M.D.   On: 04/13/2017 10:08     Medications:   . amiodarone 30 mg/hr (04/15/17 0750)  . fentaNYL infusion INTRAVENOUS  25 mcg/hr (04/15/17 0846)  . magnesium sulfate 1 - 4 g bolus IVPB 2 g (04/15/17 0917)  . norepinephrine (LEVOPHED) Adult infusion  Stopped (04/13/17 1045)  . phenylephrine (NEO-SYNEPHRINE) Adult infusion Stopped (04/13/17 1521)  . piperacillin-tazobactam (ZOSYN)  IV 3.375 g (04/15/17 6389)  . potassium phosphate IVPB (mmol)    . pureflow 2,000 each (04/15/17 0105)  . vancomycin Stopped (04/14/17 1642)  . vasopressin (PITRESSIN) infusion - *FOR SHOCK* Stopped (04/14/17 1330)   . chlorhexidine gluconate (MEDLINE KIT)  15 mL Mouth Rinse BID  . feeding supplement (PRO-STAT SUGAR FREE 64)  30 mL Per Tube BID  . feeding supplement (VITAL HIGH PROTEIN)  1,000 mL Per Tube Q24H  . ipratropium-albuterol  3 mL Nebulization Q6H  . levothyroxine  25 mcg Intravenous Daily  . mouth rinse  15 mL Mouth Rinse 10 times per day  . pantoprazole (PROTONIX) IV  40 mg Intravenous Q24H  . sertraline  25 mg Oral Daily  . silver sulfADIAZINE   Topical BID   artificial tears, fentaNYL, heparin, LORazepam, metoprolol tartrate, [DISCONTINUED] ondansetron **OR** ondansetron (ZOFRAN) IV, sodium chloride flush, zinc oxide  Assessment/ Plan:  [name] is a 66 y.o. white female  with hypertension, hypothyroidism, atrial fibrillation, who was admitted to [facility] on 04/08/2017 for evaluation of increasing lower extremity edema.  Hospital course: Admitted on 04/08/2017 with significant peripheral edema and cellulitis. Started on CRRT for hypotension and anuric renal failure on 7/2. Patient weaned off CRRT on 7/4. However later that day, patient had cardiac arrest and coded. Intubated, sedated and restarted on three vasopressors. Restart on CRRT on 7/4.   1. Acute renal failure with proteinuria and hematuria: baseline creatinine of 0.62 09/2014.  Progressive peripheral edema and hypoalbuminemia.  Recent IV contrast exposure on 04/04/17.  - Serologic work up: compliments normal, negative SPEP/UPEP, ANA negative, ANCA negative, GBM negative, negative hepatitis - Continue CVVHD. Attempt to increase UF to 32m/hr.  - Discussed case with husband  2.  Hypotension with atrial fibrillation with rapid ventricular response: off vasopressors Septic shock from cellulitis Cardiogenic shock from cardiomyopathy. Echocardiogram with EF of 25-30% - Broad spectrum antibiotics: pip/tazo and vanco. Appreciate ID input.   - amiodarone gtt  3. Anemia with renal failure and Thrombocytopenia: hemoglobin 9, platelets 50  4. 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 Hemoglobin A1c 6.2% - continue glucose control.    LOS: [id number]7/8/20189:34 AM

## 2017-04-16 DIAGNOSIS — L03119 Cellulitis of unspecified part of limb: Secondary | ICD-10-CM

## 2017-04-16 DIAGNOSIS — Z515 Encounter for palliative care: Secondary | ICD-10-CM

## 2017-04-16 DIAGNOSIS — R600 Localized edema: Secondary | ICD-10-CM

## 2017-04-16 LAB — HEPATIC FUNCTION PANEL
ALBUMIN: 2.5 g/dL — AB (ref 3.5–5.0)
ALT: 752 U/L — AB (ref 14–54)
AST: 219 U/L — AB (ref 15–41)
Alkaline Phosphatase: 608 U/L — ABNORMAL HIGH (ref 38–126)
BILIRUBIN DIRECT: 2.4 mg/dL — AB (ref 0.1–0.5)
Indirect Bilirubin: 1.9 mg/dL — ABNORMAL HIGH (ref 0.3–0.9)
Total Bilirubin: 4.3 mg/dL — ABNORMAL HIGH (ref 0.3–1.2)
Total Protein: 4.7 g/dL — ABNORMAL LOW (ref 6.5–8.1)

## 2017-04-16 LAB — BLOOD GAS, ARTERIAL
ACID-BASE EXCESS: 3.3 mmol/L — AB (ref 0.0–2.0)
Bicarbonate: 27.9 mmol/L (ref 20.0–28.0)
FIO2: 0.3
O2 SAT: 98.2 %
PCO2 ART: 42 mmHg (ref 32.0–48.0)
PEEP/CPAP: 5 cmH2O
PO2 ART: 106 mmHg (ref 83.0–108.0)
PRESSURE SUPPORT: 5 cmH2O
Patient temperature: 37
pH, Arterial: 7.43 (ref 7.350–7.450)

## 2017-04-16 LAB — RENAL FUNCTION PANEL
ALBUMIN: 2.5 g/dL — AB (ref 3.5–5.0)
ALBUMIN: 2.3 g/dL — AB (ref 3.5–5.0)
ANION GAP: 8 (ref 5–15)
ANION GAP: 9 (ref 5–15)
Albumin: 2.9 g/dL — ABNORMAL LOW (ref 3.5–5.0)
Anion gap: 5 (ref 5–15)
BUN: 25 mg/dL — AB (ref 6–20)
BUN: 26 mg/dL — ABNORMAL HIGH (ref 6–20)
BUN: 26 mg/dL — ABNORMAL HIGH (ref 6–20)
CALCIUM: 8 mg/dL — AB (ref 8.9–10.3)
CHLORIDE: 102 mmol/L (ref 101–111)
CHLORIDE: 102 mmol/L (ref 101–111)
CO2: 28 mmol/L (ref 22–32)
CO2: 26 mmol/L (ref 22–32)
CO2: 25 mmol/L (ref 22–32)
Calcium: 8 mg/dL — ABNORMAL LOW (ref 8.9–10.3)
Calcium: 8.2 mg/dL — ABNORMAL LOW (ref 8.9–10.3)
Chloride: 103 mmol/L (ref 101–111)
Creatinine, Ser: 0.56 mg/dL (ref 0.44–1.00)
Creatinine, Ser: 0.44 mg/dL (ref 0.44–1.00)
Creatinine, Ser: 0.58 mg/dL (ref 0.44–1.00)
GFR calc Af Amer: >60 mL/min (ref >60)
GFR calc non Af Amer: >60 mL/min (ref >60)
Glucose, Bld: 109 mg/dL — ABNORMAL HIGH (ref 65–99)
Glucose, Bld: 109 mg/dL — ABNORMAL HIGH (ref 65–99)
Glucose, Bld: 163 mg/dL — ABNORMAL HIGH (ref 65–99)
PHOSPHORUS: 2.9 mg/dL (ref 2.5–4.6)
PHOSPHORUS: 3 mg/dL (ref 2.5–4.6)
POTASSIUM: 4.6 mmol/L (ref 3.5–5.1)
POTASSIUM: 4.5 mmol/L (ref 3.5–5.1)
POTASSIUM: 4.9 mmol/L (ref 3.5–5.1)
Phosphorus: 2.6 mg/dL (ref 2.5–4.6)
Sodium: 135 mmol/L (ref 135–145)
Sodium: 137 mmol/L (ref 135–145)
Sodium: 136 mmol/L (ref 135–145)

## 2017-04-16 LAB — MAGNESIUM
MAGNESIUM: 1.7 mg/dL (ref 1.7–2.4)
Magnesium: 1.6 mg/dL — ABNORMAL LOW (ref 1.7–2.4)
Magnesium: 1.9 mg/dL (ref 1.7–2.4)

## 2017-04-16 LAB — CBC
HEMATOCRIT: 23.2 % — AB (ref 35.0–47.0)
Hemoglobin: 7.8 g/dL — ABNORMAL LOW (ref 12.0–16.0)
MCH: 30.8 pg (ref 26.0–34.0)
MCHC: 33.4 g/dL (ref 32.0–36.0)
MCV: 92.2 fL (ref 80.0–100.0)
PLATELETS: 56 10*3/uL — AB (ref 150–440)
RBC: 2.52 MIL/uL — AB (ref 3.80–5.20)
RDW: 17.7 % — ABNORMAL HIGH (ref 11.5–14.5)
WBC: 33.4 10*3/uL — AB (ref 3.6–11.0)

## 2017-04-16 LAB — GLUCOSE, CAPILLARY
Glucose-Capillary: 106 mg/dL — ABNORMAL HIGH (ref 65–99)
Glucose-Capillary: 107 mg/dL — ABNORMAL HIGH (ref 65–99)
Glucose-Capillary: 108 mg/dL — ABNORMAL HIGH (ref 65–99)
Glucose-Capillary: 140 mg/dL — ABNORMAL HIGH (ref 65–99)
Glucose-Capillary: 147 mg/dL — ABNORMAL HIGH (ref 65–99)
Glucose-Capillary: 150 mg/dL — ABNORMAL HIGH (ref 65–99)
Glucose-Capillary: 165 mg/dL — ABNORMAL HIGH (ref 65–99)
Glucose-Capillary: 344 mg/dL — ABNORMAL HIGH (ref 65–99)

## 2017-04-16 LAB — C DIFFICILE QUICK SCREEN W PCR REFLEX
C DIFFICILE (CDIFF) INTERP: NOT DETECTED
C DIFFICILE (CDIFF) TOXIN: NEGATIVE
C DIFFICLE (CDIFF) ANTIGEN: NEGATIVE

## 2017-04-16 LAB — PROCALCITONIN: PROCALCITONIN: 19.26 ng/mL

## 2017-04-16 LAB — VANCOMYCIN, RANDOM: Vancomycin Rm: 12

## 2017-04-16 LAB — PHOSPHORUS: Phosphorus: 3.3 mg/dL (ref 2.5–4.6)

## 2017-04-16 MED ORDER — DEXTROSE 5 % IV SOLN
0.0000 ug/min | INTRAVENOUS | Status: DC
  Administered 2017-04-16: 15 ug/min via INTRAVENOUS
  Filled 2017-04-16 (×2): qty 16

## 2017-04-16 MED ORDER — ORAL CARE MOUTH RINSE
15.0000 mL | Freq: Two times a day (BID) | OROMUCOSAL | Status: DC
  Administered 2017-04-16 – 2017-04-27 (×20): 15 mL via OROMUCOSAL

## 2017-04-16 MED ORDER — ALBUMIN HUMAN 25 % IV SOLN
12.5000 g | Freq: Three times a day (TID) | INTRAVENOUS | Status: DC
  Administered 2017-04-16 – 2017-04-19 (×9): 12.5 g via INTRAVENOUS
  Filled 2017-04-16 (×12): qty 50

## 2017-04-16 MED ORDER — VASOPRESSIN 20 UNIT/ML IV SOLN
0.0300 [IU]/min | INTRAVENOUS | Status: DC
  Administered 2017-04-16 – 2017-04-17 (×2): 0.03 [IU]/min via INTRAVENOUS
  Administered 2017-04-17: 0.01 [IU]/min via INTRAVENOUS
  Filled 2017-04-16 (×2): qty 2

## 2017-04-16 MED ORDER — HYDROCORTISONE NA SUCCINATE PF 100 MG IJ SOLR
50.0000 mg | Freq: Four times a day (QID) | INTRAMUSCULAR | Status: DC
  Administered 2017-04-16 – 2017-04-17 (×5): 50 mg via INTRAVENOUS
  Filled 2017-04-16 (×5): qty 2

## 2017-04-16 MED ORDER — PHENOL 1.4 % MT LIQD
2.0000 | OROMUCOSAL | Status: DC | PRN
  Filled 2017-04-16: qty 177

## 2017-04-16 MED ORDER — MAGNESIUM SULFATE 2 GM/50ML IV SOLN
2.0000 g | Freq: Once | INTRAVENOUS | Status: AC
Start: 2017-04-16 — End: 2017-04-16
  Administered 2017-04-16: 2 g via INTRAVENOUS
  Filled 2017-04-16: qty 50

## 2017-04-16 NOTE — Progress Notes (Signed)
[facility]  ROUNDING NOTE   Subjective:   Patient remains critically ill. Intubated and sedated. FiO2 30% Continued on CRRT UF at currently 50 cc an hour Patient requiring multiple pressors, amiodarone IV drip   Objective:  Vital signs in last 24 hours:  Temp:  [97.4 F (36.3 C)-97.9 F (36.6 C)] 97.4 F (36.3 C) (07/09 0800) Pulse Rate:  [88-108] 93 (07/09 0800) Resp:  [14-19] 15 (07/09 0800) BP: (72-125)/(48-96) 90/58 (07/09 0800) SpO2:  [95 %-100 %] 100 % (07/09 0800) FiO2 (%):  [30 %-35 %] 30 % (07/09 0809) Weight:  [93.4 kg (205 lb 13.1 oz)] 93.4 kg (205 lb 13.1 oz) (07/09 0400)  Weight change: 1.66 kg (3 lb 10.6 oz) Filed Weights   04/14/17 0500 04/15/17 0500 04/16/17 0400  Weight: 90.5 kg (199 lb 8.3 oz) 91.7 kg (202 lb 2.6 oz) 93.4 kg (205 lb 13.1 oz)    Intake/Output: I/O last 3 completed shifts: In: 3899.3 [I.V.:1079.3; Other:200; NG/GT:1400; IV Piggyback:1220] Out: 2107 [Other:1807; Stool:300]   Intake/Output this shift:  Total I/O In: 226.4 [I.V.:96.4; NG/GT:80; IV Piggyback:50] Out: 54 [Urine:5; Other:49]  Physical Exam: General: Critically ill  Head: Normocephalic, atraumatic. ETT, OGT  Eyes: Anicteric,   Neck: No masses   Lungs:  Bilateral crackles, Ventilator assisted.  Heart: Irregular, tachycardia  Abdomen:  Soft, nontender, obese  Extremities: Anasarca with massive lower extremity edema. Bilateral dressings - clean and dry.   Neurologic: Intubated, sedated  Skin: Bullous lesions with serous anginous drainage  Access: Right femoral temp HD catheter Dr. Lucky Cowboy 7/2    Basic Metabolic Panel:  Recent Labs Lab 04/15/17 0201 04/15/17 0820 04/15/17 1429 04/15/17 1443 04/15/17 2046 04/16/17 0313 04/16/17 0550  NA 135 135 133*  --  132* 135  --   K 4.5 4.3 4.9  --  4.6 4.6  --   CL 103 102 100*  --  100* 102  --   CO2 _0 --  27 28  --   GLUCOSE 128* 109* 138*  --  230* 109*  --   BUN 22* 23* 24*  --  25* 25*  --    CREATININE 0.56 0.41* 0.36*  --  0.53 0.56  --   CALCIUM 8.1* 8.0* 8.0*  --  7.7* 8.0*  --   MG 1.6* 1.6*  --  1.9 1.8 1.7  --   PHOS 2.7 2.2* 3.3  --  2.1* 2.9 3.3    Liver Function Tests:  Recent Labs Lab 04/12/17 0450  04/13/17 0427  04/14/17 1422  04/15/17 0500 04/15/17 0820 04/15/17 1429 04/15/17 2046 04/16/17 0313 04/16/17 0550  AST 3,323*  --  2,736*  --  762*  --  469*  --   --   --   --  219*  ALT 2,160*  --  2,237*  --  1,297*  --  1,172*  --   --   --   --  752*  ALKPHOS 304*  --  395*  --  482*  --  606*  --   --   --   --  608*  BILITOT 4.7*  --  7.0*  --  5.8*  --  5.4*  --   --   --   --  4.3*  PROT 5.2*  --  5.3*  --  4.7*  --  4.9*  --   --   --   --  4.7*  ALBUMIN 3.5  < > 3.4*  < >  2.7*  2.8*  < > 2.8* 2.6* 2.5* 2.4* 2.5* 2.5*  < > = values in this interval not displayed. No results for input(s): LIPASE, AMYLASE in the last 168 hours. No results for input(s): AMMONIA in the last 168 hours.  CBC:  Recent Labs Lab 04/11/17 2145  04/12/17 1852 04/13/17 0753 04/14/17 0523 04/15/17 0500 04/15/17 1352 04/16/17 0550  WBC 13.0*  < > 14.1* 19.0* 15.4* 25.9* 27.6* 33.4*  NEUTROABS 12.0*  --  12.7* 17.3* 13.2*  --  24.8*  --   HGB 7.6*  < > 7.9* 8.1* 7.4* 8.0* 7.4* 7.8*  HCT 23.2*  < > 24.3* 25.0* 22.2* 24.0* 22.4* 23.2*  MCV 96.7  < > 93.5 91.6 89.9 91.4 90.7 92.2  PLT 38*  < > 103* 81* 43* 50* 42* 56*  < > = values in this interval not displayed.  Cardiac Enzymes:  Recent Labs Lab 04/12/17 0732 04/12/17 1217  TROPONINI 0.09* 0.08*    BNP: Invalid input(s): POCBNP  CBG:  Recent Labs Lab 04/16/17 0043 04/16/17 0605 04/16/17 0609 04/16/17 0806 04/16/17 0809  GLUCAP 107* >600* 140* 344* 106*    Microbiology: Results for orders placed or performed during the hospital encounter of 04/08/17  Culture, blood (routine x 2)     Status: None   Collection Time: 04/08/17  2:04 AM  Result Value Ref Range Status   Specimen Description BLOOD  RIGHT ANTECUBITAL  Final   Special Requests   Final    BOTTLES DRAWN AEROBIC AND ANAEROBIC Blood Culture adequate volume   Culture NO GROWTH 5 DAYS  Final   Report Status 04/13/2017 FINAL  Final  Culture, blood (routine x 2)     Status: None   Collection Time: 04/08/17  2:06 AM  Result Value Ref Range Status   Specimen Description BLOOD LEFT ANTECUBITAL  Final   Special Requests   Final    BOTTLES DRAWN AEROBIC AND ANAEROBIC Blood Culture adequate volume   Culture NO GROWTH 5 DAYS  Final   Report Status 04/13/2017 FINAL  Final  C difficile quick scan w PCR reflex     Status: None   Collection Time: 04/08/17  5:09 AM  Result Value Ref Range Status   C Diff antigen NEGATIVE NEGATIVE Final   C Diff toxin NEGATIVE NEGATIVE Final   C Diff interpretation No C. difficile detected.  Final  MRSA PCR Screening     Status: None   Collection Time: 04/08/17  6:50 AM  Result Value Ref Range Status   MRSA by PCR NEGATIVE NEGATIVE Final    Comment:        The GeneXpert MRSA Assay (FDA approved for NASAL specimens only), is one component of a comprehensive MRSA colonization surveillance program. It is not intended to diagnose MRSA infection nor to guide or monitor treatment for MRSA infections.   Urine culture     Status: None   Collection Time: 04/08/17 12:16 PM  Result Value Ref Range Status   Specimen Description URINE, RANDOM  Final   Special Requests NONE  Final   Culture   Final    NO GROWTH Performed at [facility], [address], [place], [place] [address]    Report Status 04/10/2017 FINAL  Final  Aerobic Culture (superficial specimen)     Status: None   Collection Time: 04/09/17 11:32 AM  Result Value Ref Range Status   Specimen Description SKIN  Final   Special Requests NONE  Final  Gram Stain   Final    RARE WBC PRESENT, PREDOMINANTLY PMN NO SQUAMOUS EPITHELIAL CELLS SEEN NO ORGANISMS SEEN    Culture   Final    NO GROWTH 2 DAYS Performed at [facility], [address] [place], [place] [address]    Report Status 04/11/2017 FINAL  Final  Gastrointestinal Panel by PCR , Stool     Status: None   Collection Time: 04/09/17  5:10 PM  Result Value Ref Range Status   Campylobacter species NOT DETECTED NOT DETECTED Final   Plesimonas shigelloides NOT DETECTED NOT DETECTED Final   Salmonella species NOT DETECTED NOT DETECTED Final   Yersinia enterocolitica NOT DETECTED NOT DETECTED Final   Vibrio species NOT DETECTED NOT DETECTED Final   Vibrio cholerae NOT DETECTED NOT DETECTED Final   Enteroaggregative E coli (EAEC) NOT DETECTED NOT DETECTED Final   Enteropathogenic E coli (EPEC) NOT DETECTED NOT DETECTED Final   Enterotoxigenic E coli (ETEC) NOT DETECTED NOT DETECTED Final   Shiga like toxin producing E coli (STEC) NOT DETECTED NOT DETECTED Final   Shigella/Enteroinvasive E coli (EIEC) NOT DETECTED NOT DETECTED Final   Cryptosporidium NOT DETECTED NOT DETECTED Final   Cyclospora cayetanensis NOT DETECTED NOT DETECTED Final   Entamoeba histolytica NOT DETECTED NOT DETECTED Final   Giardia lamblia NOT DETECTED NOT DETECTED Final   Adenovirus F40/41 NOT DETECTED NOT DETECTED Final   Astrovirus NOT DETECTED NOT DETECTED Final   Norovirus GI/GII NOT DETECTED NOT DETECTED Final   Rotavirus A NOT DETECTED NOT DETECTED Final   Sapovirus (I, II, IV, and V) NOT DETECTED NOT DETECTED Final  Culture, respiratory (NON-Expectorated)     Status: None   Collection Time: 04/12/17 12:00 PM  Result Value Ref Range Status   Specimen Description TRACHEAL ASPIRATE  Final   Special Requests NONE  Final   Gram Stain   Final    MODERATE WBC PRESENT,BOTH PMN AND MONONUCLEAR MODERATE YEAST Performed at [facility], [address], [place], [place] [address]    Culture MODERATE CANDIDA TROPICALIS  Final   Report Status 04/14/2017 FINAL  Final  Culture, respiratory (NON-Expectorated)     Status: None (Preliminary result)   Collection Time:  04/15/17  7:48 AM  Result Value Ref Range Status   Specimen Description TRACHEAL ASPIRATE  Final   Special Requests Normal  Final   Gram Stain   Final    RARE WBC PRESENT, PREDOMINANTLY PMN FEW BUDDING YEAST SEEN Performed at [facility], [place] [address] [place], [place] [address]    Culture PENDING  Incomplete   Report Status PENDING  Incomplete    Coagulation Studies:  Recent Labs  04/15/17 1352  LABPROT 18.2*  INR 1.49    Urinalysis: No results for input(s): COLORURINE, LABSPEC, PHURINE, GLUCOSEU, HGBUR, BILIRUBINUR, KETONESUR, PROTEINUR, UROBILINOGEN, NITRITE, LEUKOCYTESUR in the last 72 hours.  Invalid input(s): APPERANCEUR    Imaging: US Venous Img Upper Bilat  Addendum Date: 04/15/2017   ADDENDUM REPORT: 04/15/2017 13:59 ADDENDUM: The original report was by Dr. Van Clines. The following addendum is by Dr. Van Clines: Critical Value/emergent results were called by telephone at the time of interpretation on 04/15/2017 at 1:45 pm to Dr. Jefferson Fuel , who verbally acknowledged these results. Electronically Signed   By: Van Clines M.D.   On: 04/15/2017 13:59   Result Date: 04/15/2017 CLINICAL DATA:  Bilateral upper extremity edema, right greater than left. EXAM: BILATERAL UPPER EXTREMITY VENOUS DOPPLER ULTRASOUND TECHNIQUE: Gray-scale  sonography with graded compression, as well as color Doppler and duplex ultrasound were performed to evaluate the bilateral upper extremity deep venous systems from the level of the subclavian vein and including the jugular, axillary, basilic, radial, ulnar and upper cephalic vein. Spectral Doppler was utilized to evaluate flow at rest and with distal augmentation maneuvers. COMPARISON:  None. FINDINGS: RIGHT UPPER EXTREMITY Internal Jugular Vein: No evidence of thrombus. Normal compressibility, respiratory phasicity and response to augmentation. Subclavian Vein: No evidence of thrombus. Normal compressibility, respiratory  phasicity and response to augmentation. Axillary Vein: No evidence of thrombus. Normal compressibility, respiratory phasicity and response to augmentation. Cephalic Vein: No evidence of thrombus. Normal compressibility, respiratory phasicity and response to augmentation. Basilic Vein: No evidence of thrombus. Normal compressibility, respiratory phasicity and response to augmentation. Brachial Veins: No evidence of thrombus. Normal compressibility, respiratory phasicity and response to augmentation. Radial Veins: No evidence of thrombus. Normal compressibility, respiratory phasicity and response to augmentation. Ulnar Veins: No evidence of thrombus. Normal compressibility, respiratory phasicity and response to augmentation. Venous Reflux:  None. Other Findings:  None. LEFT UPPER EXTREMITY Internal Jugular Vein: No evidence of thrombus. Normal compressibility, respiratory phasicity and response to augmentation. Subclavian Vein: No evidence of thrombus. Normal compressibility, respiratory phasicity and response to augmentation. Axillary Vein: No evidence of thrombus. Normal compressibility, respiratory phasicity and response to augmentation. Cephalic Vein: Occlusive thrombus in the left cephalic vein extending from the elbow region/distal upper arm to the mid humeral region. Basilic Vein: No evidence of thrombus. Normal compressibility, respiratory phasicity and response to augmentation. Brachial Veins: No evidence of thrombus. Normal compressibility, respiratory phasicity and response to augmentation. Radial Veins: No evidence of thrombus. Normal compressibility, respiratory phasicity and response to augmentation. Ulnar Veins: No evidence of thrombus. Normal compressibility, respiratory phasicity and response to augmentation. Venous Reflux:  None. Other Findings:  None. IMPRESSION: 1. Occlusive deep vein thrombosis in the left cephalic vein extending from the mid humeral region down towards the elbow. No DVT in the  right upper extremity. Radiology assistant personnel have been notified to put me in telephone contact with the referring physician or the referring physician's clinical representative in order to discuss these findings. Once this communication is established I will issue an addendum to this report for documentation purposes. Electronically Signed: By: Van Clines M.D. On: 04/15/2017 13:37     Medications:   . amiodarone 30 mg/hr (04/16/17 0819)  . fentaNYL infusion INTRAVENOUS 15 mcg/hr (04/16/17 0300)  . magnesium sulfate 1 - 4 g bolus IVPB    . norepinephrine (LEVOPHED) Adult infusion Stopped (04/13/17 1045)  . phenylephrine (NEO-SYNEPHRINE) Adult infusion 75 mcg/min (04/16/17 0826)  . piperacillin-tazobactam (ZOSYN)  IV 3.375 g (04/16/17 3567)  . pureflow 3 each (04/15/17 1335)  . vancomycin Stopped (04/15/17 1750)  . vasopressin (PITRESSIN) infusion - *FOR SHOCK* Stopped (04/14/17 1330)   . chlorhexidine gluconate (MEDLINE KIT)  15 mL Mouth Rinse BID  . feeding supplement (PRO-STAT SUGAR FREE 64)  30 mL Per Tube BID  . feeding supplement (VITAL HIGH PROTEIN)  1,000 mL Per Tube Q24H  . ipratropium-albuterol  3 mL Nebulization Q6H  . levothyroxine  25 mcg Intravenous Daily  . mouth rinse  15 mL Mouth Rinse 10 times per day  . pantoprazole (PROTONIX) IV  40 mg Intravenous Q24H  . sertraline  25 mg Oral Daily  . silver sulfADIAZINE   Topical BID   artificial tears, fentaNYL, heparin, LORazepam, metoprolol tartrate, [DISCONTINUED] ondansetron **OR** ondansetron (ZOFRAN) IV, sodium chloride flush, zinc  oxide  Assessment/ Plan:  [name] is a 66 y.o. white female  with hypertension, hypothyroidism, atrial fibrillation, who was admitted to [facility] on 04/08/2017 for evaluation of increasing lower extremity edema.  Hospital course: Admitted on 04/08/2017 with significant peripheral edema and cellulitis. Started on CRRT for hypotension and anuric renal failure on 7/2. Patient weaned  off CRRT on 7/4. However later that day, patient had cardiac arrest and coded. Intubated, sedated and restarted on three vasopressors. Restart on CRRT on 7/4.  2-D echo which shows EF 25-30%  1. Acute renal failure with proteinuria and hematuria: baseline creatinine of 0.62 09/2014.  Patient with massive peripheral edema, anasarca and hypoalbuminemia.  Recent IV contrast exposure on 04/04/17.  - Serologic work up: compliments normal, negative SPEP/UPEP, ANA negative, ANCA negative, GBM negative, negative hepatitis - Continue CVVHD. Attempt to increase UF to 100 mL/hr.  - Add IV albumin for oncotic support  2. 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 Echocardiogram with EF of 25-30% - Broad spectrum antibiotics: pip/tazo and vanco. Appreciate ID input.   - amiodarone gtt  3. Acute respiratory failure Ventilator dependent. FiO2 30%.  4. 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 Hemoglobin A1c 6.2% - continue glucose control.    LOS: 8 Chirstopher Iovino 7/9/20189:36 AM

## 2017-04-16 NOTE — Plan of Care (Signed)
Problem: Physical Regulation: Goal: Ability to maintain clinical measurements within normal limits will improve Outcome: Progressing Patient alert and able to follow commands, able to be extubated this AM and maintained adequate oxygenation on 2L [place]. BP has remained stable with weaning and changing of vasopressors, see MAR. Remains in A. Fib on the monitor with controlled rate on amiodarone gtt. Very minimal output this shift, see I/O flowsheet. CRRT running smoothly this shift, BMP and magnesium being monitored every 8 hours per order. Flexi seal in place, see I/O flowsheet.  Problem: Skin Integrity: Goal: Risk for impaired skin integrity will decrease Outcome: Progressing Patient skin assessed multiple times this shift, wound care per WOCN was completed and documented, patient has been turned every two hours this shift. All to prevent skin breakdown.  Problem: Nutrition: Goal: Adequate nutrition will be maintained Outcome: Not Progressing Patient extubated and OG tube removed. Patient refusing to take anything by mouth at this time, will continue to offer and attempt.  Problem: Physical Regulation: Goal: Dialysis access will remain free of complications Outcome: Progressing CRRT vascular access no complications this shift.

## 2017-04-16 NOTE — Progress Notes (Signed)
Patient ID: [name], female   DOB: [birth year], 66 y.o.   MRN: [id number]    [facility] PROGRESS NOTE  Anay Walter HEN:[id number] DOB: 1951/03/24 DOA: 04/08/2017 PCP: Tracie Harrier, MD  HPI/Subjective: Patient follows some simple commands while on the ventilator  Objective: Vitals:   04/16/17 1200 04/16/17 1300  BP: 114/88 98/75  Pulse: (!) 103   Resp: (!) 21 15  Temp: (!) 97.3 F (36.3 C)     Filed Weights   04/14/17 0500 04/15/17 0500 04/16/17 0400  Weight: 90.5 kg (199 lb 8.3 oz) 91.7 kg (202 lb 2.6 oz) 93.4 kg (205 lb 13.1 oz)    ROS: Review of Systems  Unable to perform ROS: Intubated    Exam: Physical Exam  Constitutional: She is intubated.  HENT:  Head: Atraumatic.  Nose: No mucosal edema.  Eyes: Lids are normal. Pupils are equal, round, and reactive to light.  Neck: Carotid bruit is not present. No thyromegaly present.  Cardiovascular: Regular rhythm, S1 normal, S2 normal and normal heart sounds.   Respiratory: She is intubated. She has decreased breath sounds in the right lower field and the left lower field. She has no wheezes. She has rhonchi in the left lower field. She has no rales.  GI: Soft. Bowel sounds are normal. There is no tenderness.  Musculoskeletal:       Right ankle: She exhibits swelling.       Left ankle: She exhibits swelling.  Lymphadenopathy:    She has no cervical adenopathy.  Neurological:  Follow some simple commands on the ventilator  Skin: Skin is warm. No cyanosis.  Left lower extremity: Anterior shin large area of blister that popped with underlying erythema. Left foot large area of blister that popped with underlying erythema. Some smaller areas of blisters that popped with underlying erythema. Right foot with some blisters that are healing Bruising right neck and left thigh  Psychiatric:  Follow some simple commands      Data Reviewed: Basic Metabolic Panel:  Recent Labs Lab 04/15/17 0820 04/15/17 1429  04/15/17 1443 04/15/17 2046 04/16/17 0313 04/16/17 0550 04/16/17 0938  NA 135 133*  --  132* 135  --  137  K 4.3 4.9  --  4.6 4.6  --  4.5  CL 102 100*  --  100* 102  --  103  CO2 26 26  --  27 28  --  26  GLUCOSE 109* 138*  --  230* 109*  --  109*  BUN 23* 24*  --  25* 25*  --  26*  CREATININE 0.41* 0.36*  --  0.53 0.56  --  0.44  CALCIUM 8.0* 8.0*  --  7.7* 8.0*  --  8.0*  MG 1.6*  --  1.9 1.8 1.7  --  1.6*  PHOS 2.2* 3.3  --  2.1* 2.9 3.3 3.0   Liver Function Tests:  Recent Labs Lab 04/12/17 0450  04/13/17 0427  04/14/17 1422  04/15/17 0500  04/15/17 1429 04/15/17 2046 04/16/17 0313 04/16/17 0550 04/16/17 0938  AST 3,323*  --  2,736*  --  762*  --  469*  --   --   --   --  219*  --   ALT 2,160*  --  2,237*  --  1,297*  --  1,172*  --   --   --   --  752*  --   ALKPHOS 304*  --  395*  --  482*  --  606*  --   --   --   --  608*  --   BILITOT 4.7*  --  7.0*  --  5.8*  --  5.4*  --   --   --   --  4.3*  --   PROT 5.2*  --  5.3*  --  4.7*  --  4.9*  --   --   --   --  4.7*  --   ALBUMIN 3.5  < > 3.4*  < > 2.7*  2.8*  < > 2.8*  < > 2.5* 2.4* 2.5* 2.5* 2.3*  < > = values in this interval not displayed. No results for input(s): LIPASE, AMYLASE in the last 168 hours. No results for input(s): AMMONIA in the last 168 hours. CBC:  Recent Labs Lab 04/11/17 2145  04/12/17 1852 04/13/17 0753 04/14/17 0523 04/15/17 0500 04/15/17 1352 04/16/17 0550  WBC 13.0*  < > 14.1* 19.0* 15.4* 25.9* 27.6* 33.4*  NEUTROABS 12.0*  --  12.7* 17.3* 13.2*  --  24.8*  --   HGB 7.6*  < > 7.9* 8.1* 7.4* 8.0* 7.4* 7.8*  HCT 23.2*  < > 24.3* 25.0* 22.2* 24.0* 22.4* 23.2*  MCV 96.7  < > 93.5 91.6 89.9 91.4 90.7 92.2  PLT 38*  < > 103* 81* 43* 50* 42* 56*  < > = values in this interval not displayed. Cardiac Enzymes:  Recent Labs Lab 04/12/17 0732 04/12/17 1217  TROPONINI 0.09* 0.08*   BNP (last 3 results)  Recent Labs  04/08/17 0139  BNP 2,012.0*     CBG:  Recent Labs Lab  04/16/17 0605 04/16/17 0609 04/16/17 0806 04/16/17 0809 04/16/17 1119  GLUCAP >600* 140* 344* 106* 108*    Recent Results (from the past 240 hour(s))  Culture, blood (routine x 2)     Status: None   Collection Time: 04/08/17  2:04 AM  Result Value Ref Range Status   Specimen Description BLOOD RIGHT ANTECUBITAL  Final   Special Requests   Final    BOTTLES DRAWN AEROBIC AND ANAEROBIC Blood Culture adequate volume   Culture NO GROWTH 5 DAYS  Final   Report Status 04/13/2017 FINAL  Final  Culture, blood (routine x 2)     Status: None   Collection Time: 04/08/17  2:06 AM  Result Value Ref Range Status   Specimen Description BLOOD LEFT ANTECUBITAL  Final   Special Requests   Final    BOTTLES DRAWN AEROBIC AND ANAEROBIC Blood Culture adequate volume   Culture NO GROWTH 5 DAYS  Final   Report Status 04/13/2017 FINAL  Final  C difficile quick scan w PCR reflex     Status: None   Collection Time: 04/08/17  5:09 AM  Result Value Ref Range Status   C Diff antigen NEGATIVE NEGATIVE Final   C Diff toxin NEGATIVE NEGATIVE Final   C Diff interpretation No C. difficile detected.  Final  MRSA PCR Screening     Status: None   Collection Time: 04/08/17  6:50 AM  Result Value Ref Range Status   MRSA by PCR NEGATIVE NEGATIVE Final    Comment:        The GeneXpert MRSA Assay (FDA approved for NASAL specimens only), is one component of a comprehensive MRSA colonization surveillance program. It is not intended to diagnose MRSA infection nor to guide or monitor treatment for MRSA infections.   Urine culture     Status: None   Collection Time: 04/08/17 12:16 PM  Result Value Ref Range Status   Specimen Description URINE, RANDOM  Final   Special Requests NONE  Final   Culture   Final    NO GROWTH Performed at [facility], [place] [address] [place], [place] [address]    Report Status 04/10/2017 FINAL  Final  Aerobic Culture (superficial specimen)     Status: None   Collection Time:  04/09/17 11:32 AM  Result Value Ref Range Status   Specimen Description SKIN  Final   Special Requests NONE  Final   Gram Stain   Final    RARE WBC PRESENT, PREDOMINANTLY PMN NO SQUAMOUS EPITHELIAL CELLS SEEN NO ORGANISMS SEEN    Culture   Final    NO GROWTH 2 DAYS Performed at [facility], [name] [address], [place], [place] [address]    Report Status 04/11/2017 FINAL  Final  Gastrointestinal Panel by PCR , Stool     Status: None   Collection Time: 04/09/17  5:10 PM  Result Value Ref Range Status   Campylobacter species NOT DETECTED NOT DETECTED Final   Plesimonas shigelloides NOT DETECTED NOT DETECTED Final   Salmonella species NOT DETECTED NOT DETECTED Final   Yersinia enterocolitica NOT DETECTED NOT DETECTED Final   Vibrio species NOT DETECTED NOT DETECTED Final   Vibrio cholerae NOT DETECTED NOT DETECTED Final   Enteroaggregative E coli (EAEC) NOT DETECTED NOT DETECTED Final   Enteropathogenic E coli (EPEC) NOT DETECTED NOT DETECTED Final   Enterotoxigenic E coli (ETEC) NOT DETECTED NOT DETECTED Final   Shiga like toxin producing E coli (STEC) NOT DETECTED NOT DETECTED Final   Shigella/Enteroinvasive E coli (EIEC) NOT DETECTED NOT DETECTED Final   Cryptosporidium NOT DETECTED NOT DETECTED Final   Cyclospora cayetanensis NOT DETECTED NOT DETECTED Final   Entamoeba histolytica NOT DETECTED NOT DETECTED Final   Giardia lamblia NOT DETECTED NOT DETECTED Final   Adenovirus F40/41 NOT DETECTED NOT DETECTED Final   Astrovirus NOT DETECTED NOT DETECTED Final   Norovirus GI/GII NOT DETECTED NOT DETECTED Final   Rotavirus A NOT DETECTED NOT DETECTED Final   Sapovirus (I, II, IV, and V) NOT DETECTED NOT DETECTED Final  Culture, respiratory (NON-Expectorated)     Status: None   Collection Time: 04/12/17 12:00 PM  Result Value Ref Range Status   Specimen Description TRACHEAL ASPIRATE  Final   Special Requests NONE  Final   Gram Stain   Final    MODERATE WBC PRESENT,BOTH PMN AND  MONONUCLEAR MODERATE YEAST Performed at [facility], [address], [place], [place] [address]    Culture MODERATE CANDIDA TROPICALIS  Final   Report Status 04/14/2017 FINAL  Final  Culture, respiratory (NON-Expectorated)     Status: None (Preliminary result)   Collection Time: 04/15/17  7:48 AM  Result Value Ref Range Status   Specimen Description TRACHEAL ASPIRATE  Final   Special Requests Normal  Final   Gram Stain   Final    RARE WBC PRESENT, PREDOMINANTLY PMN FEW BUDDING YEAST SEEN    Culture   Final    CULTURE REINCUBATED FOR BETTER GROWTH Performed at [facility], [place] [address] [place], [place] [address]    Report Status PENDING  Incomplete  CULTURE, BLOOD (ROUTINE X 2) w Reflex to ID Panel     Status: None (Preliminary result)   Collection Time: 04/15/17 10:46 AM  Result Value Ref Range Status   Specimen Description BLOOD LEFT SUBCLAVIAN TRIPLE LUMEN  Final   Special Requests   Final    BOTTLES DRAWN AEROBIC  AND ANAEROBIC Blood Culture results may not be optimal due to an excessive volume of blood received in culture bottles   Culture NO GROWTH 1 DAY  Final   Report Status PENDING  Incomplete     Studies: US Venous Img Upper Bilat  Addendum Date: 04/15/2017   ADDENDUM REPORT: 04/15/2017 13:59 ADDENDUM: The original report was by Dr. Van Clines. The following addendum is by Dr. Van Clines: Critical Value/emergent results were called by telephone at the time of interpretation on 04/15/2017 at 1:45 pm to Dr. Jefferson Fuel , who verbally acknowledged these results. Electronically Signed   By: Van Clines M.D.   On: 04/15/2017 13:59   Result Date: 04/15/2017 CLINICAL DATA:  Bilateral upper extremity edema, right greater than left. EXAM: BILATERAL UPPER EXTREMITY VENOUS DOPPLER ULTRASOUND TECHNIQUE: Gray-scale sonography with graded compression, as well as color Doppler and duplex ultrasound were performed to evaluate the bilateral upper extremity  deep venous systems from the level of the subclavian vein and including the jugular, axillary, basilic, radial, ulnar and upper cephalic vein. Spectral Doppler was utilized to evaluate flow at rest and with distal augmentation maneuvers. COMPARISON:  None. FINDINGS: RIGHT UPPER EXTREMITY Internal Jugular Vein: No evidence of thrombus. Normal compressibility, respiratory phasicity and response to augmentation. Subclavian Vein: No evidence of thrombus. Normal compressibility, respiratory phasicity and response to augmentation. Axillary Vein: No evidence of thrombus. Normal compressibility, respiratory phasicity and response to augmentation. Cephalic Vein: No evidence of thrombus. Normal compressibility, respiratory phasicity and response to augmentation. Basilic Vein: No evidence of thrombus. Normal compressibility, respiratory phasicity and response to augmentation. Brachial Veins: No evidence of thrombus. Normal compressibility, respiratory phasicity and response to augmentation. Radial Veins: No evidence of thrombus. Normal compressibility, respiratory phasicity and response to augmentation. Ulnar Veins: No evidence of thrombus. Normal compressibility, respiratory phasicity and response to augmentation. Venous Reflux:  None. Other Findings:  None. LEFT UPPER EXTREMITY Internal Jugular Vein: No evidence of thrombus. Normal compressibility, respiratory phasicity and response to augmentation. Subclavian Vein: No evidence of thrombus. Normal compressibility, respiratory phasicity and response to augmentation. Axillary Vein: No evidence of thrombus. Normal compressibility, respiratory phasicity and response to augmentation. Cephalic Vein: Occlusive thrombus in the left cephalic vein extending from the elbow region/distal upper arm to the mid humeral region. Basilic Vein: No evidence of thrombus. Normal compressibility, respiratory phasicity and response to augmentation. Brachial Veins: No evidence of thrombus. Normal  compressibility, respiratory phasicity and response to augmentation. Radial Veins: No evidence of thrombus. Normal compressibility, respiratory phasicity and response to augmentation. Ulnar Veins: No evidence of thrombus. Normal compressibility, respiratory phasicity and response to augmentation. Venous Reflux:  None. Other Findings:  None. IMPRESSION: 1. Occlusive deep vein thrombosis in the left cephalic vein extending from the mid humeral region down towards the elbow. No DVT in the right upper extremity. Radiology assistant personnel have been notified to put me in telephone contact with the referring physician or the referring physician's clinical representative in order to discuss these findings. Once this communication is established I will issue an addendum to this report for documentation purposes. Electronically Signed: By: Van Clines M.D. On: 04/15/2017 13:37    Scheduled Meds: . chlorhexidine gluconate (MEDLINE KIT)  15 mL Mouth Rinse BID  . feeding supplement (PRO-STAT SUGAR FREE 64)  30 mL Per Tube BID  . feeding supplement (VITAL HIGH PROTEIN)  1,000 mL Per Tube Q24H  . hydrocortisone sod succinate (SOLU-CORTEF) inj  50 mg Intravenous Q6H  . ipratropium-albuterol  3 mL Nebulization Q6H  .  levothyroxine  25 mcg Intravenous Daily  . mouth rinse  15 mL Mouth Rinse 10 times per day  . pantoprazole (PROTONIX) IV  40 mg Intravenous Q24H  . sertraline  25 mg Oral Daily  . silver sulfADIAZINE   Topical BID   Continuous Infusions: . albumin human    . amiodarone 30 mg/hr (04/16/17 0819)  . fentaNYL infusion INTRAVENOUS Stopped (04/16/17 1044)  . norepinephrine (LEVOPHED) Adult infusion 2 mcg/min (04/16/17 1355)  . phenylephrine (NEO-SYNEPHRINE) Adult infusion Stopped (04/16/17 1204)  . piperacillin-tazobactam (ZOSYN)  IV 3.375 g (04/16/17 1342)  . pureflow 3 each (04/15/17 1335)  . vancomycin Stopped (04/15/17 1750)  . vasopressin (PITRESSIN) infusion - *FOR SHOCK* 0.03  Units/min (04/16/17 1154)    Assessment/Plan:  1. Septic shock. Patient Back on pressors while on fentanyl. Continue antibiotics as per infectious disease with vancomycin and Zosyn. Patient has lower extremity bullus cellulitis. 2. PEA Cardiopulmonary arrest. Supportive care with ventilator at this time. Ventilator adjustments as per critical care specialist. Was on 30% FiO2 this morning 3. Shock liver from cardiopulmonary arrest. Liver function trending better. 4. 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 Patient still not making much urine. 5. Hypomagnesemia, hypophosphatemia and hypokalemia. Electrolyte protocol as per ICU 6. Atrial fibrillation on amiodarone for rate control. Pradaxa on hold. 7. Coagulopathy. Could be secondary to shock liver. 8. Diarrhea. Has rectal tube in. Stool studies are negative 9. Cardiomyopathy on echocardiogram. Blood pressure too low for any medications at this point 10. Thrombocytopenia could be secondary to sepsis. Continue to watch platelet count 11. Anemia- continue to watch hemoglobin closely 12. Leukocytosis. Elevation in white blood cell counts. ID to reevaluate today  Code Status:     Code Status Orders        Start     Ordered   04/08/17 0651  Full code  Continuous     04/08/17 0650    Code Status History    Date Active Date Inactive Code Status Order ID Comments User Context   This patient has a current code status but no historical code status.     Family Communication: Spoke with Family at bedside Disposition Plan: To be determined based on clinical course  Consultants:  Critical care specialist  Infectious disease  Nephrology  Palliative care  Cardiology  Antibiotics:  Vancomycin  Zosyn  Time spent: 22 minutes  [place], [place]

## 2017-04-16 NOTE — Progress Notes (Signed)
Daily Progress Note   Patient Name: [name]       Date: 04/16/2017 DOB: 01-01-1951  Age: 66 y.o. MRN#: [id number] Attending Physician: Loletha Grayer, MD Primary Care Physician: Tracie Harrier, MD Admit Date: 04/08/2017  Reason for Consultation/Follow-up: Establishing goals of care  Subjective: Met with husband and step dtr Colletta [place] at bedside.  Patient is awake and indicates that she is not in pain.   Husband was an EMT.  Dtr is a Programmer, applications.    I had an extended conversation in a private room with husband and dtr.  DTr under the impression that her mother is much better and improving.  Husband tearful/emotional.  We discussed  - Continued renal failure with the inability to dialyze well on CRRT due to hypotension.  Back on pressors.  Patient still intubated.  Weaning trials are pending.  We discussed code status at length.  Husband too emotional to make a decision.  Dtr stephanie understood the decision.  We also discussed the potential upcoming decisions including Trach, feeding tube, outpatient hemodialysis, potentially living in a facility.    I encouraged the family to think of the patient's values and what she would want.  Family indicated that she would  (1) Not want to be connected to machines long term (2) She would not want to live in a facility (3) she probably would be ok with taking HD outpatient.    I committed that PMT will continue to support and inform - helping to prepare the family for the next step.   Assessment: Patient remains critically ill, very fragile.  Family emotional.  Dtr may be somewhat unrealistic.   Patient Profile/HPI:  66 y.o. female  with past medical history of hypertension, hypothyroidism, atrial fibrillation, and anxiety admitted on  04/08/2017 with lower extremity edema and progressive weakness. Admitting diagnoses of septic shock, cellulitis, and acute kidney injury. Negative for DVT's. Echo revealed EF 25-30%. Nephrology and ID consultations. CRRT initiated on 04/09/17. On 04/11/17, patient with PEA arrest. ROSC in 2 minutes, intubated, and patient restarted on pressors. Now with cardiogenic shock, AKI remains on CRRT, elevated LFT's secondary to shock and hepatic congestion, thrombocytopenia, and worsening PCT likely due to LE cellulitis and superimposed pneumonia. Multiorgan failure with poor prognosis. Palliative medicine consult for goals of care.   Length of Stay:  8  Current Medications: Scheduled Meds:  . chlorhexidine gluconate (MEDLINE KIT)  15 mL Mouth Rinse BID  . feeding supplement (PRO-STAT SUGAR FREE 64)  30 mL Per Tube BID  . feeding supplement (VITAL HIGH PROTEIN)  1,000 mL Per Tube Q24H  . hydrocortisone sod succinate (SOLU-CORTEF) inj  50 mg Intravenous Q6H  . ipratropium-albuterol  3 mL Nebulization Q6H  . levothyroxine  25 mcg Intravenous Daily  . mouth rinse  15 mL Mouth Rinse 10 times per day  . pantoprazole (PROTONIX) IV  40 mg Intravenous Q24H  . sertraline  25 mg Oral Daily  . silver sulfADIAZINE   Topical BID    Continuous Infusions: . albumin human    . amiodarone 30 mg/hr (04/16/17 0819)  . fentaNYL infusion INTRAVENOUS Stopped (04/16/17 1044)  . norepinephrine (LEVOPHED) Adult infusion    . phenylephrine (NEO-SYNEPHRINE) Adult infusion 75 mcg/min (04/16/17 0826)  . piperacillin-tazobactam (ZOSYN)  IV Stopped (04/16/17 1038)  . pureflow 3 each (04/15/17 1335)  . vancomycin Stopped (04/15/17 1750)  . vasopressin (PITRESSIN) infusion - *FOR SHOCK*      PRN Meds: artificial tears, fentaNYL, heparin, LORazepam, metoprolol tartrate, [DISCONTINUED] ondansetron **OR** ondansetron (ZOFRAN) IV, sodium chloride flush, zinc oxide  Physical Exam       Well developed 66 yo female opens her eyes to  voice and responds by shaking her head no  CV rrr resp intubated 3+ edema in each extremity.  Vital Signs: BP 90/76   Pulse (!) 101   Temp (!) 97.4 F (36.3 C) (Oral)   Resp (!) 23   Ht '5\' 5"'  (1.651 m)   Wt 93.4 kg (205 lb 13.1 oz)   SpO2 93%   BMI 34.25 kg/m  SpO2: SpO2: 93 % O2 Device: O2 Device: Nasal Cannula O2 Flow Rate: O2 Flow Rate (L/min): 2 L/min  Intake/output summary:   Intake/Output Summary (Last 24 hours) at 04/16/17 1133 Last data filed at 04/16/17 1100  Gross per 24 hour  Intake          2776.31 ml  Output             1194 ml  Net          1582.31 ml   LBM: Last BM Date: 04/15/17 Baseline Weight: Weight: 86.2 kg (190 lb) Most recent weight: Weight: 93.4 kg (205 lb 13.1 oz)       Palliative Assessment/Data:    Flowsheet Rows     Most Recent Value  Intake Tab  Referral Department  Critical care  Unit at Time of Referral  ICU  Palliative Care Primary Diagnosis  Other (Comment) [multiorgan failure]  Date Notified  04/13/17  Palliative Care Type  New Palliative care  Reason for referral  Clarify Goals of Care, End of Life Care Assistance  Date of Admission  04/08/17  Date first seen by Palliative Care  04/13/17  # of days Palliative referral response time  0 Day(s)  # of days IP prior to Palliative referral  5  Clinical Assessment  Palliative Performance Scale Score  20%  Psychosocial & Spiritual Assessment  Palliative Care Outcomes  Patient/Family meeting held?  Yes  Who was at the meeting?  husband dtr  Palliative Care Outcomes  Clarified goals of care      Patient Active Problem List   Diagnosis Date Noted  . Acute respiratory failure ([place])   . Acute renal failure ([place])   . Multi-organ failure with heart failure ([place])   . Palliative  care by specialist   . Goals of care, counseling/discussion   . Septic shock ([place]) 04/08/2017    Palliative Care Plan    Recommendations/Plan:  PMT will continue to follow to inform the family and  help guide decisions  Goals of Care and Additional Recommendations:  Limitations on Scope of Treatment: Full Scope Treatment  Code Status:  Full code  Prognosis:   Unable to determine.  In current state she is at very high risk for an acute event.   Discharge Planning:  To Be Determined  Care plan was discussed with bedside RN  Thank you for allowing the Palliative Medicine Team to assist in the care of this patient.  Total time spent:  60 min     Greater than 50%  of this time was spent counseling and coordinating care related to the above assessment and plan.  Florentina Jenny, PA-C Palliative Medicine  Please contact Palliative MedicineTeam phone at [phone] for questions and concerns between 7 am - 7 pm.   Please see AMION for individual provider pager numbers.

## 2017-04-16 NOTE — Progress Notes (Signed)
Pt. Was extubated prior to extubation for a small amount of thick pink tinged secretions. Per Dr. Zoila Shutter order, she was extubated and placed on a 2 L nasal cannula. She is without stridor and is voicing.

## 2017-04-16 NOTE — Progress Notes (Signed)
[place] rounding ICU unit met with pt's daughter and husband in the family waiting room. Pt's husband and daughter told [place] that pt was extubated today and was able to talk with them for first time. Husband was tearful and full of enjoy. Pt's husband and daughter asked [place] to continue praying for pt and for them. [place] visited and offered prayers for pt and family. [place] to follow up with pt as needed.    04/16/17 1600  Clinical Encounter Type  Visited With Patient and family together  Visit Type Follow-up;Spiritual support  Referral From Family  Consult/Referral To Chaplain  Spiritual Encounters  Spiritual Needs Prayer;Emotional

## 2017-04-16 NOTE — Progress Notes (Signed)
[place] INFECTIOUS DISEASE PROGRESS NOTE Date of Admission:  04/08/2017     ID: [name] is a 66 y.o. female with sepsis  Active Problems:   Septic shock ([place])   Acute respiratory failure ([place])   Acute renal failure ([place])   Multi-organ failure with heart failure ([place])   Palliative care by specialist   Goals of care, counseling/discussion   Cellulitis of lower extremity   Edema extremities   Subjective: Extubated today, still on CRRT, on pressors. Remains confused, not eating   ROS  Unable to obtain  Medications:  Antibiotics Given (last 72 hours)    Date/Time Action Medication Dose Rate   04/13/17 1652 New Bag/Given   vancomycin (VANCOCIN) 1,500 mg in sodium chloride 0.9 % 500 mL IVPB 1,500 mg 250 mL/hr   04/13/17 2142 New Bag/Given   piperacillin-tazobactam (ZOSYN) IVPB 3.375 g 3.375 g 12.5 mL/hr   04/14/17 0604 New Bag/Given   piperacillin-tazobactam (ZOSYN) IVPB 3.375 g 3.375 g 12.5 mL/hr   04/14/17 1411 New Bag/Given   piperacillin-tazobactam (ZOSYN) IVPB 3.375 g 3.375 g 12.5 mL/hr   04/14/17 1542 New Bag/Given   vancomycin (VANCOCIN) IVPB 1000 mg/200 mL premix 1,000 mg 200 mL/hr   04/14/17 2247 New Bag/Given   piperacillin-tazobactam (ZOSYN) IVPB 3.375 g 3.375 g 12.5 mL/hr   04/15/17 0109 New Bag/Given   piperacillin-tazobactam (ZOSYN) IVPB 3.375 g 3.375 g 12.5 mL/hr   04/15/17 1405 New Bag/Given   piperacillin-tazobactam (ZOSYN) IVPB 3.375 g 3.375 g 12.5 mL/hr   04/15/17 1650 New Bag/Given   vancomycin (VANCOCIN) IVPB 1000 mg/200 mL premix 1,000 mg 200 mL/hr   04/15/17 2127 New Bag/Given   piperacillin-tazobactam (ZOSYN) IVPB 3.375 g 3.375 g 12.5 mL/hr   04/16/17 3235 New Bag/Given   piperacillin-tazobactam (ZOSYN) IVPB 3.375 g 3.375 g 12.5 mL/hr   04/16/17 1342 New Bag/Given   piperacillin-tazobactam (ZOSYN) IVPB 3.375 g 3.375 g 12.5 mL/hr   04/16/17 1553 New Bag/Given   vancomycin (VANCOCIN) IVPB 1000 mg/200 mL premix 1,000 mg 200 mL/hr     .  chlorhexidine gluconate (MEDLINE KIT)  15 mL Mouth Rinse BID  . feeding supplement (PRO-STAT SUGAR FREE 64)  30 mL Per Tube BID  . feeding supplement (VITAL HIGH PROTEIN)  1,000 mL Per Tube Q24H  . hydrocortisone sod succinate (SOLU-CORTEF) inj  50 mg Intravenous Q6H  . ipratropium-albuterol  3 mL Nebulization Q6H  . levothyroxine  25 mcg Intravenous Daily  . mouth rinse  15 mL Mouth Rinse 10 times per day  . pantoprazole (PROTONIX) IV  40 mg Intravenous Q24H  . sertraline  25 mg Oral Daily  . silver sulfADIAZINE   Topical BID    Objective: Vital signs in last 24 hours: Temp:  [97.3 F (36.3 C)-97.9 F (36.6 C)] 97.3 F (36.3 C) (07/09 1200) Pulse Rate:  [90-108] 100 (07/09 1400) Resp:  [12-23] 17 (07/09 1500) BP: (72-114)/(48-88) 107/73 (07/09 1500) SpO2:  [93 %-100 %] 97 % (07/09 1400) FiO2 (%):  [30 %] 30 % (07/09 0809) Weight:  [93.4 kg (205 lb 13.1 oz)] 93.4 kg (205 lb 13.1 oz) (07/09 0400) Constitutional:  , ill appearing HENT: [place]/AT, PERRLA, no scleral icterus, Mouth/Throat: dry mm Cardiovascular:Tachy, 2/6 sm Pulmonary/Chest: dec bs bil bases Neck = supple, no nuchal rigidity Abdominal: Soft. Bowel sounds are normal.  exhibits no distension. There is no tenderness. Rectal tube Lymphadenopathy: no cervical adenopathy. No axillary adenopathy Neurological: intubated and sedated HD cath RLE  Ext 3+ edema bil le Skin: bruising on  L inner thigh, Bil LE wrapped but Dr Alva Garnet and RN describe ruptured bullae Psychiatric: unable to obtain   Lab Results  Recent Labs  04/15/17 1352  04/16/17 0313 04/16/17 0550 04/16/17 0938  WBC 27.6*  --   --  33.4*  --   HGB 7.4*  --   --  7.8*  --   HCT 22.4*  --   --  23.2*  --   NA  --   < > 135  --  137  K  --   < > 4.6  --  4.5  CL  --   < > 102  --  103  CO2  --   < > 28  --  26  BUN  --   < > 25*  --  26*  CREATININE  --   < > 0.56  --  0.44  < > = values in this interval not displayed.  Microbiology: Results for orders  placed or performed during the hospital encounter of 04/08/17  Culture, blood (routine x 2)     Status: None   Collection Time: 04/08/17  2:04 AM  Result Value Ref Range Status   Specimen Description BLOOD RIGHT ANTECUBITAL  Final   Special Requests   Final    BOTTLES DRAWN AEROBIC AND ANAEROBIC Blood Culture adequate volume   Culture NO GROWTH 5 DAYS  Final   Report Status 04/13/2017 FINAL  Final  Culture, blood (routine x 2)     Status: None   Collection Time: 04/08/17  2:06 AM  Result Value Ref Range Status   Specimen Description BLOOD LEFT ANTECUBITAL  Final   Special Requests   Final    BOTTLES DRAWN AEROBIC AND ANAEROBIC Blood Culture adequate volume   Culture NO GROWTH 5 DAYS  Final   Report Status 04/13/2017 FINAL  Final  C difficile quick scan w PCR reflex     Status: None   Collection Time: 04/08/17  5:09 AM  Result Value Ref Range Status   C Diff antigen NEGATIVE NEGATIVE Final   C Diff toxin NEGATIVE NEGATIVE Final   C Diff interpretation No C. difficile detected.  Final  MRSA PCR Screening     Status: None   Collection Time: 04/08/17  6:50 AM  Result Value Ref Range Status   MRSA by PCR NEGATIVE NEGATIVE Final    Comment:        The GeneXpert MRSA Assay (FDA approved for NASAL specimens only), is one component of a comprehensive MRSA colonization surveillance program. It is not intended to diagnose MRSA infection nor to guide or monitor treatment for MRSA infections.   Urine culture     Status: None   Collection Time: 04/08/17 12:16 PM  Result Value Ref Range Status   Specimen Description URINE, RANDOM  Final   Special Requests NONE  Final   Culture   Final    NO GROWTH Performed at [facility], [address], [place], [place] [address]    Report Status 04/10/2017 FINAL  Final  Aerobic Culture (superficial specimen)     Status: None   Collection Time: 04/09/17 11:32 AM  Result Value Ref Range Status   Specimen Description SKIN  Final   Special  Requests NONE  Final   Gram Stain   Final    RARE WBC PRESENT, PREDOMINANTLY PMN NO SQUAMOUS EPITHELIAL CELLS SEEN NO ORGANISMS SEEN    Culture   Final    NO GROWTH 2  DAYS Performed at [facility], [place] [address], [place], [place] [address]    Report Status 04/11/2017 FINAL  Final  Gastrointestinal Panel by PCR , Stool     Status: None   Collection Time: 04/09/17  5:10 PM  Result Value Ref Range Status   Campylobacter species NOT DETECTED NOT DETECTED Final   Plesimonas shigelloides NOT DETECTED NOT DETECTED Final   Salmonella species NOT DETECTED NOT DETECTED Final   Yersinia enterocolitica NOT DETECTED NOT DETECTED Final   Vibrio species NOT DETECTED NOT DETECTED Final   Vibrio cholerae NOT DETECTED NOT DETECTED Final   Enteroaggregative E coli (EAEC) NOT DETECTED NOT DETECTED Final   Enteropathogenic E coli (EPEC) NOT DETECTED NOT DETECTED Final   Enterotoxigenic E coli (ETEC) NOT DETECTED NOT DETECTED Final   Shiga like toxin producing E coli (STEC) NOT DETECTED NOT DETECTED Final   Shigella/Enteroinvasive E coli (EIEC) NOT DETECTED NOT DETECTED Final   Cryptosporidium NOT DETECTED NOT DETECTED Final   Cyclospora cayetanensis NOT DETECTED NOT DETECTED Final   Entamoeba histolytica NOT DETECTED NOT DETECTED Final   Giardia lamblia NOT DETECTED NOT DETECTED Final   Adenovirus F40/41 NOT DETECTED NOT DETECTED Final   Astrovirus NOT DETECTED NOT DETECTED Final   Norovirus GI/GII NOT DETECTED NOT DETECTED Final   Rotavirus A NOT DETECTED NOT DETECTED Final   Sapovirus (I, II, IV, and V) NOT DETECTED NOT DETECTED Final  Culture, respiratory (NON-Expectorated)     Status: None   Collection Time: 04/12/17 12:00 PM  Result Value Ref Range Status   Specimen Description TRACHEAL ASPIRATE  Final   Special Requests NONE  Final   Gram Stain   Final    MODERATE WBC PRESENT,BOTH PMN AND MONONUCLEAR MODERATE YEAST Performed at [facility], [address] [place],  [place] [address]    Culture MODERATE CANDIDA TROPICALIS  Final   Report Status 04/14/2017 FINAL  Final  Culture, respiratory (NON-Expectorated)     Status: None (Preliminary result)   Collection Time: 04/15/17  7:48 AM  Result Value Ref Range Status   Specimen Description TRACHEAL ASPIRATE  Final   Special Requests Normal  Final   Gram Stain   Final    RARE WBC PRESENT, PREDOMINANTLY PMN FEW BUDDING YEAST SEEN    Culture   Final    CULTURE REINCUBATED FOR BETTER GROWTH Performed at [facility], [place] [address], [place], [place] [address]    Report Status PENDING  Incomplete  CULTURE, BLOOD (ROUTINE X 2) w Reflex to ID Panel     Status: None (Preliminary result)   Collection Time: 04/15/17 10:46 AM  Result Value Ref Range Status   Specimen Description BLOOD LEFT SUBCLAVIAN TRIPLE LUMEN  Final   Special Requests   Final    BOTTLES DRAWN AEROBIC AND ANAEROBIC Blood Culture results may not be optimal due to an excessive volume of blood received in culture bottles   Culture NO GROWTH 1 DAY  Final   Report Status PENDING  Incomplete    Studies/Results: US Venous Img Upper Bilat  Addendum Date: 04/15/2017   ADDENDUM REPORT: 04/15/2017 13:59 ADDENDUM: The original report was by Dr. Van Clines. The following addendum is by Dr. Van Clines: Critical Value/emergent results were called by telephone at the time of interpretation on 04/15/2017 at 1:45 pm to Dr. Jefferson Fuel , who verbally acknowledged these results. Electronically Signed   By: Van Clines M.D.   On: 04/15/2017 13:59   Result Date:  04/15/2017 CLINICAL DATA:  Bilateral upper extremity edema, right greater than left. EXAM: BILATERAL UPPER EXTREMITY VENOUS DOPPLER ULTRASOUND TECHNIQUE: Gray-scale sonography with graded compression, as well as color Doppler and duplex ultrasound were performed to evaluate the bilateral upper extremity deep venous systems from the level of the subclavian vein and including the jugular,  axillary, basilic, radial, ulnar and upper cephalic vein. Spectral Doppler was utilized to evaluate flow at rest and with distal augmentation maneuvers. COMPARISON:  None. FINDINGS: RIGHT UPPER EXTREMITY Internal Jugular Vein: No evidence of thrombus. Normal compressibility, respiratory phasicity and response to augmentation. Subclavian Vein: No evidence of thrombus. Normal compressibility, respiratory phasicity and response to augmentation. Axillary Vein: No evidence of thrombus. Normal compressibility, respiratory phasicity and response to augmentation. Cephalic Vein: No evidence of thrombus. Normal compressibility, respiratory phasicity and response to augmentation. Basilic Vein: No evidence of thrombus. Normal compressibility, respiratory phasicity and response to augmentation. Brachial Veins: No evidence of thrombus. Normal compressibility, respiratory phasicity and response to augmentation. Radial Veins: No evidence of thrombus. Normal compressibility, respiratory phasicity and response to augmentation. Ulnar Veins: No evidence of thrombus. Normal compressibility, respiratory phasicity and response to augmentation. Venous Reflux:  None. Other Findings:  None. LEFT UPPER EXTREMITY Internal Jugular Vein: No evidence of thrombus. Normal compressibility, respiratory phasicity and response to augmentation. Subclavian Vein: No evidence of thrombus. Normal compressibility, respiratory phasicity and response to augmentation. Axillary Vein: No evidence of thrombus. Normal compressibility, respiratory phasicity and response to augmentation. Cephalic Vein: Occlusive thrombus in the left cephalic vein extending from the elbow region/distal upper arm to the mid humeral region. Basilic Vein: No evidence of thrombus. Normal compressibility, respiratory phasicity and response to augmentation. Brachial Veins: No evidence of thrombus. Normal compressibility, respiratory phasicity and response to augmentation. Radial Veins: No  evidence of thrombus. Normal compressibility, respiratory phasicity and response to augmentation. Ulnar Veins: No evidence of thrombus. Normal compressibility, respiratory phasicity and response to augmentation. Venous Reflux:  None. Other Findings:  None. IMPRESSION: 1. Occlusive deep vein thrombosis in the left cephalic vein extending from the mid humeral region down towards the elbow. No DVT in the right upper extremity. Radiology assistant personnel have been notified to put me in telephone contact with the referring physician or the referring physician's clinical representative in order to discuss these findings. Once this communication is established I will issue an addendum to this report for documentation purposes. Electronically Signed: By: Van Clines M.D. On: 04/15/2017 13:37   Study Conclusions  - Left ventricle: The cavity size was mildly dilated. There was   mild concentric hypertrophy. Systolic function was severely   reduced. The estimated ejection fraction was in the range of 25%   to 30%. - Aortic valve: There was moderate regurgitation. - Mitral valve: There was mild regurgitation. - Left atrium: The atrium was mildly dilated. - Right atrium: The atrium was mildly dilated. - Tricuspid valve: There was moderate regurgitation.  Assessment/Plan: [name] is a 66 y.o. female admitted with 1-2 months of progressive LE edema, progressive to bullae and cellulitis, hypotension as well as bil pleural effusions. Labs are significant for coagulopathy, TCP,  elevated BNP, acute renal failure, low albumin, elevated lactate, proteinuria, marked elevated procalcitonin.  WBC only mildly elevated, no fevers. [place] neg.   Her strep PNA urinary ag is also + but bcx pending. CT scan done a few days prior to admit shows effusion, possible R consolidation, gallstones and subq edema on Abd wall.   She certainly has LE cellulitis  but  this is related to the edema and not the primary underlying  etiology. Gram stain is neg on fluid from bullae. Also likely PNA based on CXR and urinary ag + S pna.  Echo shows severe systolic CHF, Mod AR, Mod TR.   She has not unusual exposures, no tick exposures. Endocarditis is a possibility but echo and bcx neg so far.  BCX ucx neg. HIV neg, ESR 21. C diff and stool PCR neg.  She coded and was now intubated and on 3 pressors. No obvious etiology of the renal failure and CHF.   LFTS very high from shock liver.  HGB dropped impressively. Stool heme +.  Procalcitonin decreasing.  July 9 - extubated  But still on CRRT and pressors, WBC increased. Some diarrhea. Day 10 of zosyn Day 4 of vanco Recommendations GIven diarrhea and increasing WBC suspect C diff Check C diff - if + start oral vanco and can dc IV vanco and zosyn   Thank you very much for the consult. Will follow with you.  FITZGERALD, DAVID P   04/16/2017, 3:54 PM

## 2017-04-16 NOTE — Progress Notes (Signed)
[place] for Electrolytes   Pharmacy consulted for electrolyte management for 66 yo female ICU patient requiring CRRT. Patient is s/p extubation on 7/9. Patient now with diarrhea and being checked for clostridium difficile.   Plan:  Patient ordered magnesium 2g IV X 1. Will replace for goal magnesium > 2.   Will recheck electrolytes with am labs.   No Known Allergies  Patient Measurements: Height: 5\' 5"  (165.1 cm) Weight: 205 lb 13.1 oz (93.4 kg) IBW/kg (Calculated) : 57   Vital Signs: Temp: 97.3 F (36.3 C) (07/09 1600) Temp Source: Axillary (07/09 1600) BP: 110/74 (07/09 2000) Pulse Rate: 105 (07/09 2000) Intake/Output from previous day: 07/08 0701 - 07/09 0700 In: 2658.9 [I.V.:878.9; NG/GT:920; IV Piggyback:660] Out: 1227  Intake/Output from this shift: No intake/output data recorded.  Labs:  Recent Labs  04/14/17 0523  04/14/17 1422  04/15/17 0500  04/15/17 1352  04/15/17 2046 04/16/17 0313 04/16/17 0550 04/16/17 0938 04/16/17 1708  WBC 15.4*  --   --   --  25.9*  --  27.6*  --   --   --  33.4*  --   --   HGB 7.4*  --   --   --  8.0*  --  7.4*  --   --   --  7.8*  --   --   HCT 22.2*  --   --   --  24.0*  --  22.4*  --   --   --  23.2*  --   --   PLT 43*  --   --   --  50*  --  42*  --   --   --  56*  --   --   APTT 46*  --   --   --  42*  39*  --   --   --  108*  --   --   --   --   CREATININE  --   < > 0.57  < >  --   < >  --   < > 0.53 0.56  --  0.44 0.58  MG  --   < > 1.9  < >  --   < >  --   < > 1.8 1.7  --  1.6*  --   PHOS 1.1*  < > 2.0*  < >  --   < >  --   < > 2.1* 2.9 3.3 3.0 2.6  ALBUMIN  --   < > 2.7*  2.8*  < > 2.8*  < >  --   < > 2.4* 2.5* 2.5* 2.3* 2.9*  PROT  --   --  4.7*  --  4.9*  --   --   --   --   --  4.7*  --   --   AST  --   --  762*  --  469*  --   --   --   --   --  219*  --   --   ALT  --   --  1,297*  --  1,172*  --   --   --   --   --  752*  --   --   ALKPHOS  --   --  482*  --  606*   --   --   --   --   --  608*  --   --  BILITOT  --   --  5.8*  --  5.4*  --   --   --   --   --  4.3*  --   --   BILIDIR  --   --  3.1*  --  2.9*  --   --   --   --   --  2.4*  --   --   IBILI  --   --  2.7*  --  2.5*  --   --   --   --   --  1.9*  --   --   < > = values in this interval not displayed. Estimated Creatinine Clearance: 79.2 mL/min (by C-G formula based on SCr of 0.58 mg/dL).  Pharmacy will continue to monitor and adjust per consult.   Simpson,Michael L 04/16/2017,8:09 PM

## 2017-04-16 NOTE — Progress Notes (Signed)
Nutrition Follow-up  DOCUMENTATION CODES:   Obesity unspecified  INTERVENTION:  1. Monitor PO intake, needs for ONS  NUTRITION DIAGNOSIS:   Inadequate oral intake related to inability to eat as evidenced by NPO status (Intubated, sedated on ventilator). -resolving, patient diet now advanced  GOAL:   Patient will meet greater than or equal to 90% of their needs -progressing, diet now advanced  MONITOR:   Diet advancement, Labs, PO intake, Supplement acceptance, Weight trends  ASSESSMENT:   66 yo female with PMH of Chronic A-Fib, Hypothyroidism, presents with LE Edema, blistering lesions on BLE, progressive weakness, cardiogenic shock vs septic shock, Severe Sepsis, cellulitis, AKI. PEA arrested 7/4 w/ ROSC within 2 mins following ACLS protocol  Extubated this morning, groggy but doing ok. Continues to exhibit anasarca. Family met with PMT today -indications from note below: (1) Not want to be connected to machines long term (2) She would not want to live in a facility (3) she probably would be ok with taking HD outpatient.   Continues on CVVHD Access: CVC Triple Lumen LIJ, HD Cath R Femoral Triple Lumen Rectal Tube - 365m out OGT removed Needs re-estimated  Labs and medications reviewed: Mg 1.6, Tbili 4.3 Levo gtt, Amiodarone gtt, Vasopressin gtt Protonix, Synthroid,   Diet Order:     Skin:  Wound (see comment) (Blisters to BLE)  Last BM:  04/15/2017 (Type 7)  Height:   Ht Readings from Last 1 Encounters:  04/09/17 '5\' 5"'  (1.651 m)    Weight:   Wt Readings from Last 1 Encounters:  04/16/17 205 lb 13.1 oz (93.4 kg)    Ideal Body Weight:  56.81 kg  BMI:  Body mass index is 34.25 kg/m.  Estimated Nutritional Needs:   Kcal:  1700-2000 calories (ABW x20-25)  Protein:  >/= 114 grams (IBW x2.0)  Fluid:  Per Nephrology  EDUCATION NEEDS:   Education needs no appropriate at this time  WSatira Anis Anikka Marsan, MS, RD LDN Inpatient Clinical Dietitian Pager  [phone]

## 2017-04-16 NOTE — Progress Notes (Signed)
MEDICATION RELATED CONSULT NOTE   Pharmacy Consult for Electrolytes Indication: Low phos  No Known Allergies  Patient Measurements: Height: 5\' 5"  (165.1 cm) Weight: 205 lb 13.1 oz (93.4 kg) IBW/kg (Calculated) : 57   Vital Signs: Temp: 97.4 F (36.3 C) (07/09 0800) Temp Source: Oral (07/09 0800) BP: 90/58 (07/09 0800) Pulse Rate: 93 (07/09 0800) Intake/Output from previous day: 07/08 0701 - 07/09 0700 In: 2658.9 [I.V.:878.9; NG/GT:920; IV Piggyback:660] Out: 1227  Intake/Output from this shift: Total I/O In: 226.4 [I.V.:96.4; NG/GT:80; IV Piggyback:50] Out: 54 [Urine:5; Other:49]  Labs:  Recent Labs  04/14/17 0523  04/14/17 1422  04/15/17 0500  04/15/17 1352 04/15/17 1429 04/15/17 1443 04/15/17 2046 04/16/17 0313 04/16/17 0550  WBC 15.4*  --   --   --  25.9*  --  27.6*  --   --   --   --  33.4*  HGB 7.4*  --   --   --  8.0*  --  7.4*  --   --   --   --  7.8*  HCT 22.2*  --   --   --  24.0*  --  22.4*  --   --   --   --  23.2*  PLT 43*  --   --   --  50*  --  42*  --   --   --   --  56*  APTT 46*  --   --   --  42*  39*  --   --   --   --  108*  --   --   CREATININE  --   < > 0.57  < >  --   < >  --  0.36*  --  0.53 0.56  --   MG  --   < > 1.9  < >  --   < >  --   --  1.9 1.8 1.7  --   PHOS 1.1*  < > 2.0*  < >  --   < >  --  3.3  --  2.1* 2.9 3.3  ALBUMIN  --   < > 2.7*  2.8*  < > 2.8*  < >  --  2.5*  --  2.4* 2.5* 2.5*  PROT  --   --  4.7*  --  4.9*  --   --   --   --   --   --  4.7*  AST  --   --  762*  --  469*  --   --   --   --   --   --  219*  ALT  --   --  1,297*  --  1,172*  --   --   --   --   --   --  752*  ALKPHOS  --   --  482*  --  606*  --   --   --   --   --   --  608*  BILITOT  --   --  5.8*  --  5.4*  --   --   --   --   --   --  4.3*  BILIDIR  --   --  3.1*  --  2.9*  --   --   --   --   --   --  2.4*  IBILI  --   --  2.7*  --  2.5*  --   --   --   --   --   --  1.9*  < > = values in this interval not displayed. Estimated Creatinine  Clearance: 79.2 mL/min (by C-G formula based on SCr of 0.56 mg/dL).  Assessment: 66 yo female on CRRT with phos <1.0 this AM. Pharmacy consult for electrolytes. Per discussion with RN, plan to continue on CRRT today. Renal MD note also states to continue CVVHD.  K 3.5, Mag 1.6, Phos <1.0  Goal of Therapy:  Electrolytes WNL  Plan:  Mag sulfate 2 g IV x1 KPhos 30 mmol IV x1 Recheck Phos (and K) at 2200 Renal function panel and magnesium level ordered q6h - may need to order labs for AM based on timing of supplementation.  7/7 2008 K 4.1, Mg 1.8, phos 1.5. CCMP orders potassium phosphate 30 mmol IV x 1. Will recheck electrolytes with AM labs.  7/8 0820 labs: K 4.3, Mag 1.6, Phos 2.2. Dr.Conforti ordered Magnesium sulfate 2 gm IV x1. Will order Potassium Phosphate 15 mmol IV x 1. Patient on CRRT. Will f/u electrolytes with evening (~2000)labs.  7/8 2043 K 4.6, Mg 1.8, phos 2.4  7/9 K 4.6, Mg 1.7, phos 2.9. Remains on CRRT. Will recheck electrolytes tomorrow with AM labs.   Pharmacy will continue to follow.   Laural Benes, Pharm.D., BCPS Clinical Pharmacist 04/16/2017,8:20 AM

## 2017-04-17 ENCOUNTER — Inpatient Hospital Stay: Payer: Medicare Other

## 2017-04-17 DIAGNOSIS — N171 Acute kidney failure with acute cortical necrosis: Secondary | ICD-10-CM

## 2017-04-17 DIAGNOSIS — I509 Heart failure, unspecified: Secondary | ICD-10-CM

## 2017-04-17 DIAGNOSIS — Z7189 Other specified counseling: Secondary | ICD-10-CM

## 2017-04-17 LAB — RENAL FUNCTION PANEL
ALBUMIN: 3 g/dL — AB (ref 3.5–5.0)
ALBUMIN: 3.6 g/dL (ref 3.5–5.0)
ANION GAP: 9 (ref 5–15)
ANION GAP: 10 (ref 5–15)
Albumin: 3.2 g/dL — ABNORMAL LOW (ref 3.5–5.0)
Anion gap: 8 (ref 5–15)
BUN: 27 mg/dL — AB (ref 6–20)
BUN: 28 mg/dL — ABNORMAL HIGH (ref 6–20)
BUN: 26 mg/dL — ABNORMAL HIGH (ref 6–20)
CALCIUM: 8.4 mg/dL — AB (ref 8.9–10.3)
CALCIUM: 8.9 mg/dL (ref 8.9–10.3)
CHLORIDE: 102 mmol/L (ref 101–111)
CO2: 26 mmol/L (ref 22–32)
CO2: 24 mmol/L (ref 22–32)
CO2: 24 mmol/L (ref 22–32)
CREATININE: 0.44 mg/dL (ref 0.44–1.00)
Calcium: 8.4 mg/dL — ABNORMAL LOW (ref 8.9–10.3)
Chloride: 102 mmol/L (ref 101–111)
Chloride: 103 mmol/L (ref 101–111)
Creatinine, Ser: 0.65 mg/dL (ref 0.44–1.00)
Creatinine, Ser: 0.5 mg/dL (ref 0.44–1.00)
GFR calc Af Amer: >60 mL/min (ref >60)
GFR calc Af Amer: >60 mL/min (ref >60)
GFR calc non Af Amer: >60 mL/min (ref >60)
GLUCOSE: 148 mg/dL — AB (ref 65–99)
Glucose, Bld: 153 mg/dL — ABNORMAL HIGH (ref 65–99)
Glucose, Bld: 127 mg/dL — ABNORMAL HIGH (ref 65–99)
PHOSPHORUS: 2.4 mg/dL — AB (ref 2.5–4.6)
PHOSPHORUS: 2.3 mg/dL — AB (ref 2.5–4.6)
POTASSIUM: 4.8 mmol/L (ref 3.5–5.1)
POTASSIUM: 4.8 mmol/L (ref 3.5–5.1)
POTASSIUM: 4.8 mmol/L (ref 3.5–5.1)
Phosphorus: 2.5 mg/dL (ref 2.5–4.6)
SODIUM: 135 mmol/L (ref 135–145)
SODIUM: 137 mmol/L (ref 135–145)
Sodium: 136 mmol/L (ref 135–145)

## 2017-04-17 LAB — COMPREHENSIVE METABOLIC PANEL
ALK PHOS: 598 U/L — AB (ref 38–126)
ALT: 567 U/L — ABNORMAL HIGH (ref 14–54)
ANION GAP: 8 (ref 5–15)
AST: 133 U/L — ABNORMAL HIGH (ref 15–41)
Albumin: 3.1 g/dL — ABNORMAL LOW (ref 3.5–5.0)
BILIRUBIN TOTAL: 4.7 mg/dL — AB (ref 0.3–1.2)
BUN: 27 mg/dL — ABNORMAL HIGH (ref 6–20)
CALCIUM: 8.4 mg/dL — AB (ref 8.9–10.3)
CO2: 25 mmol/L (ref 22–32)
Chloride: 102 mmol/L (ref 101–111)
Creatinine, Ser: 0.47 mg/dL (ref 0.44–1.00)
GFR calc non Af Amer: >60 mL/min (ref >60)
Glucose, Bld: 145 mg/dL — ABNORMAL HIGH (ref 65–99)
Potassium: 4.9 mmol/L (ref 3.5–5.1)
SODIUM: 135 mmol/L (ref 135–145)
TOTAL PROTEIN: 5.5 g/dL — AB (ref 6.5–8.1)

## 2017-04-17 LAB — CBC WITH DIFFERENTIAL/PLATELET
BASOS ABS: 0 10*3/uL (ref 0–0.1)
Band Neutrophils: 7 %
Basophils Relative: 0 %
Blasts: 0 %
Eosinophils Absolute: 0 10*3/uL (ref 0–0.7)
Eosinophils Relative: 0 %
HCT: 23.1 % — ABNORMAL LOW (ref 35.0–47.0)
HEMOGLOBIN: 7.6 g/dL — AB (ref 12.0–16.0)
Lymphocytes Relative: 8 %
Lymphs Abs: 2.5 10*3/uL (ref 1.0–3.6)
MCH: 30.6 pg (ref 26.0–34.0)
MCHC: 33 g/dL (ref 32.0–36.0)
MCV: 92.8 fL (ref 80.0–100.0)
METAMYELOCYTES PCT: 1 %
MONO ABS: 0.3 10*3/uL (ref 0.2–0.9)
MYELOCYTES: 2 %
Monocytes Relative: 1 %
NEUTROS PCT: 81 %
NRBC: 2 /100{WBCs} — AB
Neutro Abs: 28 10*3/uL — ABNORMAL HIGH (ref 1.4–6.5)
Other: 0 %
PLATELETS: 42 10*3/uL — AB (ref 150–440)
PROMYELOCYTES ABS: 0 %
RBC: 2.49 MIL/uL — AB (ref 3.80–5.20)
RDW: 17.5 % — ABNORMAL HIGH (ref 11.5–14.5)
WBC: 30.8 10*3/uL — AB (ref 3.6–11.0)

## 2017-04-17 LAB — HEPARIN INDUCED PLATELET AB (HIT ANTIBODY): HEPARIN INDUCED PLT AB: 0.258 {OD_unit} (ref 0.000–0.400)

## 2017-04-17 LAB — GLUCOSE, CAPILLARY
Glucose-Capillary: >600 mg/dL (ref 65–99)
Glucose-Capillary: 146 mg/dL — ABNORMAL HIGH (ref 65–99)
Glucose-Capillary: 147 mg/dL — ABNORMAL HIGH (ref 65–99)
Glucose-Capillary: 148 mg/dL — ABNORMAL HIGH (ref 65–99)
Glucose-Capillary: 101 mg/dL — ABNORMAL HIGH (ref 65–99)
Glucose-Capillary: 102 mg/dL — ABNORMAL HIGH (ref 65–99)

## 2017-04-17 LAB — PROCALCITONIN: Procalcitonin: 25 ng/mL

## 2017-04-17 LAB — MAGNESIUM
MAGNESIUM: 1.8 mg/dL (ref 1.7–2.4)
MAGNESIUM: 2.3 mg/dL (ref 1.7–2.4)
Magnesium: 1.9 mg/dL (ref 1.7–2.4)

## 2017-04-17 LAB — HEPATITIS C ANTIBODY: HCV Ab: <0.1 s/co ratio (ref 0.0–0.9)

## 2017-04-17 LAB — HEPATITIS B CORE ANTIBODY, IGM: HEP B C IGM: NEGATIVE

## 2017-04-17 LAB — HEPATITIS B SURFACE ANTIBODY,QUALITATIVE: Hep B S Ab: NONREACTIVE

## 2017-04-17 LAB — APTT: aPTT: 35 seconds (ref 24–36)

## 2017-04-17 MED ORDER — VANCOMYCIN HCL 10 G IV SOLR
1250.0000 mg | INTRAVENOUS | Status: DC
  Filled 2017-04-17: qty 1250

## 2017-04-17 MED ORDER — LEVOTHYROXINE SODIUM 50 MCG PO TABS
50.0000 ug | ORAL_TABLET | Freq: Every day | ORAL | Status: DC
  Administered 2017-04-18 – 2017-04-20 (×3): 50 ug via ORAL
  Filled 2017-04-17 (×3): qty 1

## 2017-04-17 MED ORDER — ENSURE ENLIVE PO LIQD
237.0000 mL | Freq: Three times a day (TID) | ORAL | Status: DC
  Administered 2017-04-17 – 2017-04-24 (×14): 237 mL via ORAL

## 2017-04-17 MED ORDER — PANTOPRAZOLE SODIUM 40 MG PO TBEC
40.0000 mg | DELAYED_RELEASE_TABLET | Freq: Every day | ORAL | Status: DC
  Administered 2017-04-18 – 2017-04-20 (×3): 40 mg via ORAL
  Filled 2017-04-17 (×3): qty 1

## 2017-04-17 MED ORDER — MAGNESIUM SULFATE 2 GM/50ML IV SOLN
2.0000 g | Freq: Once | INTRAVENOUS | Status: AC
  Administered 2017-04-17: 2 g via INTRAVENOUS
  Filled 2017-04-17: qty 50

## 2017-04-17 NOTE — Progress Notes (Signed)
Patient ID: [name], female   DOB: 1951/01/01, 66 y.o.   MRN: [id number]    [facility] PROGRESS NOTE  [name] [id number]:[id number] DOB: 1951/03/29 DOA: 04/08/2017 PCP: Shannon Harrier, MD  HPI/Subjective: Patient extubated yesterday evening. She is slow with her responses but does follow commands and answer some questions. Difficulty with moving her left leg. She states that her mouth is sore area  Objective: Vitals:   04/17/17 1400 04/17/17 1500  BP: 107/79 116/80  Pulse: 99 (!) 115  Resp: (!) 27 (!) 22  Temp:      Filed Weights   04/14/17 0500 04/15/17 0500 04/16/17 0400  Weight: 90.5 kg (199 lb 8.3 oz) 91.7 kg (202 lb 2.6 oz) 93.4 kg (205 lb 13.1 oz)    ROS: Review of Systems  Unable to perform ROS: Acuity of condition  Constitutional: Negative for fever.  Respiratory: Negative for cough and shortness of breath.   Cardiovascular: Negative for chest pain.  Gastrointestinal: Positive for diarrhea. Negative for abdominal pain, nausea and vomiting.  Musculoskeletal: Negative for joint pain.  Neurological: Negative for headaches.    Exam: Physical Exam  HENT:  Head: Atraumatic.  Nose: No mucosal edema.  Mouth/Throat: No oropharyngeal exudate or posterior oropharyngeal edema.  Eyes: Lids are normal. Pupils are equal, round, and reactive to light.  Neck: Carotid bruit is not present. No thyromegaly present.  Cardiovascular: Regular rhythm, S1 normal, S2 normal and normal heart sounds.   Respiratory: She has decreased breath sounds in the right lower field and the left lower field. She has no wheezes. She has rhonchi in the left lower field. She has no rales.  GI: Soft. Bowel sounds are normal. There is no tenderness.  Musculoskeletal:       Right ankle: She exhibits swelling.       Left ankle: She exhibits swelling.  Lymphadenopathy:    She has no cervical adenopathy.  Neurological: She is alert.  Able to wiggle toes on the left foot. Not able to move the left  leg. Able to move the right leg a little bit. Able to squeeze my hands bilaterally.  Skin: Skin is warm. No cyanosis.  Left lower extremity covered today Right foot with some blisters that are healing Bruising right neck and left thigh  Psychiatric: She has a normal mood and affect.      Data Reviewed: Basic Metabolic Panel:  Recent Labs Lab 04/16/17 0313 04/16/17 0550 04/16/17 0998 04/16/17 1708 04/17/17 0113 04/17/17 0452 04/17/17 0817  NA 135  --  137 136 136 135 135  K 4.6  --  4.5 4.9 4.8 4.9 4.8  CL 102  --  103 102 102 102 102  CO2 28  --  26 25 26 25 24   GLUCOSE 109*  --  109* 163* 153* 145* 148*  BUN 25*  --  26* 26* 27* 27* 28*  CREATININE 0.56  --  0.44 0.58 0.44 0.47 0.65  CALCIUM 8.0*  --  8.0* 8.2* 8.4* 8.4* 8.4*  MG 1.7  --  1.6* 1.9 1.8  --  1.9  PHOS 2.9 3.3 3.0 2.6 2.4*  --  2.5   Liver Function Tests:  Recent Labs Lab 04/13/17 0427  04/14/17 1422  04/15/17 0500  04/16/17 0550 04/16/17 0938 04/16/17 1708 04/17/17 0113 04/17/17 0452 04/17/17 0817  AST 2,736*  --  762*  --  469*  --  219*  --   --   --  133*  --   ALT  2,237*  --  1,297*  --  1,172*  --  752*  --   --   --  567*  --   ALKPHOS 395*  --  482*  --  606*  --  608*  --   --   --  598*  --   BILITOT 7.0*  --  5.8*  --  5.4*  --  4.3*  --   --   --  4.7*  --   PROT 5.3*  --  4.7*  --  4.9*  --  4.7*  --   --   --  5.5*  --   ALBUMIN 3.4*  < > 2.7*  2.8*  < > 2.8*  < > 2.5* 2.3* 2.9* 3.0* 3.1* 3.2*  < > = values in this interval not displayed. No results for input(s): LIPASE, AMYLASE in the last 168 hours. No results for input(s): AMMONIA in the last 168 hours. CBC:  Recent Labs Lab 04/12/17 1852 04/13/17 0753 04/14/17 0523 04/15/17 0500 04/15/17 1352 04/16/17 0550 04/17/17 0452  WBC 14.1* 19.0* 15.4* 25.9* 27.6* 33.4* 30.8*  NEUTROABS 12.7* 17.3* 13.2*  --  24.8*  --  28.0*  HGB 7.9* 8.1* 7.4* 8.0* 7.4* 7.8* 7.6*  HCT 24.3* 25.0* 22.2* 24.0* 22.4* 23.2* 23.1*  MCV 93.5  91.6 89.9 91.4 90.7 92.2 92.8  PLT 103* 81* 43* 50* 42* 56* 42*   Cardiac Enzymes:  Recent Labs Lab 04/12/17 0732 04/12/17 1217  TROPONINI 0.09* 0.08*   BNP (last 3 results)  Recent Labs  04/08/17 0139  BNP 2,012.0*     CBG:  Recent Labs Lab 04/16/17 2002 04/16/17 2349 04/17/17 0351 04/17/17 0730 04/17/17 1235  GLUCAP 150* 147* 146* 147* 148*    Recent Results (from the past 240 hour(s))  Culture, blood (routine x 2)     Status: None   Collection Time: 04/08/17  2:04 AM  Result Value Ref Range Status   Specimen Description BLOOD RIGHT ANTECUBITAL  Final   Special Requests   Final    BOTTLES DRAWN AEROBIC AND ANAEROBIC Blood Culture adequate volume   Culture NO GROWTH 5 DAYS  Final   Report Status 04/13/2017 FINAL  Final  Culture, blood (routine x 2)     Status: None   Collection Time: 04/08/17  2:06 AM  Result Value Ref Range Status   Specimen Description BLOOD LEFT ANTECUBITAL  Final   Special Requests   Final    BOTTLES DRAWN AEROBIC AND ANAEROBIC Blood Culture adequate volume   Culture NO GROWTH 5 DAYS  Final   Report Status 04/13/2017 FINAL  Final  C difficile quick scan w PCR reflex     Status: None   Collection Time: 04/08/17  5:09 AM  Result Value Ref Range Status   C Diff antigen NEGATIVE NEGATIVE Final   C Diff toxin NEGATIVE NEGATIVE Final   C Diff interpretation No C. difficile detected.  Final  MRSA PCR Screening     Status: None   Collection Time: 04/08/17  6:50 AM  Result Value Ref Range Status   MRSA by PCR NEGATIVE NEGATIVE Final    Comment:        The GeneXpert MRSA Assay (FDA approved for NASAL specimens only), is one component of a comprehensive MRSA colonization surveillance program. It is not intended to diagnose MRSA infection nor to guide or monitor treatment for MRSA infections.   Urine culture     Status: None   Collection Time: 04/08/17  12:16 PM  Result Value Ref Range Status   Specimen Description URINE, RANDOM  Final    Special Requests NONE  Final   Culture   Final    NO GROWTH Performed at [facility], [address] [place], [place] [address]    Report Status 04/10/2017 FINAL  Final  Aerobic Culture (superficial specimen)     Status: None   Collection Time: 04/09/17 11:32 AM  Result Value Ref Range Status   Specimen Description SKIN  Final   Special Requests NONE  Final   Gram Stain   Final    RARE WBC PRESENT, PREDOMINANTLY PMN NO SQUAMOUS EPITHELIAL CELLS SEEN NO ORGANISMS SEEN    Culture   Final    NO GROWTH 2 DAYS Performed at [facility], [place] [address], [place], [place] [address]    Report Status 04/11/2017 FINAL  Final  Gastrointestinal Panel by PCR , Stool     Status: None   Collection Time: 04/09/17  5:10 PM  Result Value Ref Range Status   Campylobacter species NOT DETECTED NOT DETECTED Final   Plesimonas shigelloides NOT DETECTED NOT DETECTED Final   Salmonella species NOT DETECTED NOT DETECTED Final   Yersinia enterocolitica NOT DETECTED NOT DETECTED Final   Vibrio species NOT DETECTED NOT DETECTED Final   Vibrio cholerae NOT DETECTED NOT DETECTED Final   Enteroaggregative E coli (EAEC) NOT DETECTED NOT DETECTED Final   Enteropathogenic E coli (EPEC) NOT DETECTED NOT DETECTED Final   Enterotoxigenic E coli (ETEC) NOT DETECTED NOT DETECTED Final   Shiga like toxin producing E coli (STEC) NOT DETECTED NOT DETECTED Final   Shigella/Enteroinvasive E coli (EIEC) NOT DETECTED NOT DETECTED Final   Cryptosporidium NOT DETECTED NOT DETECTED Final   Cyclospora cayetanensis NOT DETECTED NOT DETECTED Final   Entamoeba histolytica NOT DETECTED NOT DETECTED Final   Giardia lamblia NOT DETECTED NOT DETECTED Final   Adenovirus F40/41 NOT DETECTED NOT DETECTED Final   Astrovirus NOT DETECTED NOT DETECTED Final   Norovirus GI/GII NOT DETECTED NOT DETECTED Final   Rotavirus A NOT DETECTED NOT DETECTED Final   Sapovirus (I, II, IV, and V) NOT DETECTED NOT DETECTED Final   Culture, respiratory (NON-Expectorated)     Status: None   Collection Time: 04/12/17 12:00 PM  Result Value Ref Range Status   Specimen Description TRACHEAL ASPIRATE  Final   Special Requests NONE  Final   Gram Stain   Final    MODERATE WBC PRESENT,BOTH PMN AND MONONUCLEAR MODERATE YEAST Performed at [facility], [address], [place], [place] [address]    Culture MODERATE CANDIDA TROPICALIS  Final   Report Status 04/14/2017 FINAL  Final  Culture, respiratory (NON-Expectorated)     Status: None (Preliminary result)   Collection Time: 04/15/17  7:48 AM  Result Value Ref Range Status   Specimen Description TRACHEAL ASPIRATE  Final   Special Requests Normal  Final   Gram Stain   Final    RARE WBC PRESENT, PREDOMINANTLY PMN FEW BUDDING YEAST SEEN Performed at [facility], [place] [address] [place], [place] [address]    Culture ABUNDANT CANDIDA TROPICALIS  Final   Report Status PENDING  Incomplete  CULTURE, BLOOD (ROUTINE X 2) w Reflex to ID Panel     Status: None (Preliminary result)   Collection Time: 04/15/17 10:46 AM  Result Value Ref Range Status   Specimen Description BLOOD LEFT SUBCLAVIAN TRIPLE LUMEN  Final   Special Requests  Final    BOTTLES DRAWN AEROBIC AND ANAEROBIC Blood Culture results may not be optimal due to an excessive volume of blood received in culture bottles   Culture NO GROWTH 2 DAYS  Final   Report Status PENDING  Incomplete  CULTURE, BLOOD (ROUTINE X 2) w Reflex to ID Panel     Status: None (Preliminary result)   Collection Time: 04/16/17  1:21 PM  Result Value Ref Range Status   Specimen Description BLOOD BLOOD LEFT HAND  Final   Special Requests   Final    BOTTLES DRAWN AEROBIC AND ANAEROBIC Blood Culture results may not be optimal due to an inadequate volume of blood received in culture bottles   Culture NO GROWTH 1 DAY  Final   Report Status PENDING  Incomplete  CULTURE, BLOOD (ROUTINE X 2) w Reflex to ID Panel     Status: None  (Preliminary result)   Collection Time: 04/16/17  1:27 PM  Result Value Ref Range Status   Specimen Description BLOOD A-LINE DRAW  Final   Special Requests   Final    BOTTLES DRAWN AEROBIC AND ANAEROBIC Blood Culture adequate volume   Culture NO GROWTH 1 DAY  Final   Report Status PENDING  Incomplete  C difficile quick scan w PCR reflex     Status: None   Collection Time: 04/16/17 10:15 PM  Result Value Ref Range Status   C Diff antigen NEGATIVE NEGATIVE Final   C Diff toxin NEGATIVE NEGATIVE Final   C Diff interpretation No C. difficile detected.  Final    Comment: VALID     Studies: Dg Chest Port 1 View  Result Date: 04/17/2017 CLINICAL DATA:  Respiratory failure EXAM: PORTABLE CHEST 1 VIEW COMPARISON:  April 14, 2017 FINDINGS: Central catheter tip is in the superior vena cava. Endotracheal tube and nasogastric tube have been removed. No pneumothorax. There is interstitial pulmonary edema with pleural effusions bilaterally. There is patchy airspace consolidation in the left lower lobe. Heart is enlarged with mild pulmonary venous hypertension. No appreciable adenopathy. No bone lesions. IMPRESSION: No pneumothorax. Central catheter position unchanged. Evidence of underlying congestive heart failure. Superimposed pneumonia in the left lower lobe cannot be excluded radiographically. There appears to be slight increase in opacity in the left base compared to recent study. Electronically Signed   By: Lowella Grip III M.D.   On: 04/17/2017 07:07    Scheduled Meds: . feeding supplement (ENSURE ENLIVE)  237 mL Oral TID BM  . ipratropium-albuterol  3 mL Nebulization Q6H  . [START ON 04/18/2017] levothyroxine  50 mcg Oral QAC breakfast  . mouth rinse  15 mL Mouth Rinse BID  . [START ON 04/18/2017] pantoprazole  40 mg Oral Daily  . sertraline  25 mg Oral Daily  . silver sulfADIAZINE   Topical BID   Continuous Infusions: . albumin human    . amiodarone 30 mg/hr (04/17/17 0830)  .  norepinephrine (LEVOPHED) Adult infusion Stopped (04/17/17 0330)  . phenylephrine (NEO-SYNEPHRINE) Adult infusion Stopped (04/16/17 1204)  . pureflow 2,000 mL/hr at 04/17/17 1005  . vasopressin (PITRESSIN) infusion - *FOR SHOCK* 0.03 Units/min (04/17/17 1110)    Assessment/Plan:  1. Septic shock. Patient On pressors with the continuous dialysis. Infectious disease specialist recommended stopping the antibiotics and monitoring. 2. PEA Cardiopulmonary arrest.  3. Shock liver from cardiopulmonary arrest. Liver function trending better. 4. 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 Patient still not making much urine.  5. Acute respiratory failure status post extubation  yesterday evening 6. Hypomagnesemia, hypophosphatemia and hypokalemia. Resolved 7. Atrial fibrillation on amiodarone for rate control. Pradaxa on hold. 8. Coagulopathy. Could be secondary to shock liver. 9. Diarrhea. Has rectal tube in. Stool for C. difficile -1/2 time 10. Cardiomyopathy on echocardiogram. Blood pressure too low for any medications at this point 11. Thrombocytopenia could be secondary to sepsis. Continue to watch platelet count 12. Anemia- continue to watch hemoglobin closely 13. Leukocytosis. ID stop antibiotics. 14. Hypothyroidism unspecified on levothyroxine 15. Depression on Zoloft  Code Status:     Code Status Orders        Start     Ordered   04/08/17 0651  Full code  Continuous     04/08/17 0650    Code Status History    Date Active Date Inactive Code Status Order ID Comments User Context   This patient has a current code status but no historical code status.     Family Communication: Spoke with Husband at bedside Disposition Plan: To be determined based on clinical course  Consultants:  Critical care specialist  Infectious disease  Nephrology  Palliative care  Cardiology  Antibiotics:  Stopped  Time spent: 24 minutes  Loletha Grayer  [employer]

## 2017-04-17 NOTE — Progress Notes (Signed)
Pt's temp persistently low despite applying warm blankets and increasing room temp.  Bair Hugger applied at this time.  Will recheck temp and monitor closely.

## 2017-04-17 NOTE — Progress Notes (Signed)
[facility] [place] INFECTIOUS DISEASE PROGRESS NOTE Date of Admission:  04/08/2017     ID: Aamilah Augenstein is a 66 y.o. female with sepsis  Active Problems:   Septic shock ([place])   Acute respiratory failure ([place])   Acute renal failure ([place])   Multi-organ failure with heart failure ([place])   Palliative care by specialist   Goals of care, counseling/discussion   Cellulitis of lower extremity   Edema extremities   Subjective: Revmains extubate. Very little uop still on CRRT, on vasopressin. Less confused, not eating   ROS  Unable to obtain  Medications:  Antibiotics Given (last 72 hours)    Date/Time Action Medication Dose Rate   04/14/17 1542 New Bag/Given   vancomycin (VANCOCIN) IVPB 1000 mg/200 mL premix 1,000 mg 200 mL/hr   04/14/17 2247 New Bag/Given   piperacillin-tazobactam (ZOSYN) IVPB 3.375 g 3.375 g 12.5 mL/hr   04/15/17 0607 New Bag/Given   piperacillin-tazobactam (ZOSYN) IVPB 3.375 g 3.375 g 12.5 mL/hr   04/15/17 1405 New Bag/Given   piperacillin-tazobactam (ZOSYN) IVPB 3.375 g 3.375 g 12.5 mL/hr   04/15/17 1650 New Bag/Given   vancomycin (VANCOCIN) IVPB 1000 mg/200 mL premix 1,000 mg 200 mL/hr   04/15/17 2127 New Bag/Given   piperacillin-tazobactam (ZOSYN) IVPB 3.375 g 3.375 g 12.5 mL/hr   04/16/17 2637 New Bag/Given   piperacillin-tazobactam (ZOSYN) IVPB 3.375 g 3.375 g 12.5 mL/hr   04/16/17 1342 New Bag/Given   piperacillin-tazobactam (ZOSYN) IVPB 3.375 g 3.375 g 12.5 mL/hr   04/16/17 1553 New Bag/Given   vancomycin (VANCOCIN) IVPB 1000 mg/200 mL premix 1,000 mg 200 mL/hr   04/16/17 2255 New Bag/Given   piperacillin-tazobactam (ZOSYN) IVPB 3.375 g 3.375 g 12.5 mL/hr   04/17/17 0605 New Bag/Given   piperacillin-tazobactam (ZOSYN) IVPB 3.375 g 3.375 g 12.5 mL/hr     . feeding supplement (ENSURE ENLIVE)  237 mL Oral TID BM  . ipratropium-albuterol  3 mL Nebulization Q6H  . levothyroxine  25 mcg Intravenous Daily  . mouth rinse  15 mL Mouth Rinse BID  . pantoprazole  (PROTONIX) IV  40 mg Intravenous Q24H  . sertraline  25 mg Oral Daily  . silver sulfADIAZINE   Topical BID    Objective: Vital signs in last 24 hours: Temp:  [97.2 F (36.2 C)-97.6 F (36.4 C)] 97.2 F (36.2 C) (07/10 1100) Pulse Rate:  [101-121] 105 (07/10 1300) Resp:  [17-26] 18 (07/10 1300) BP: (92-129)/(62-94) 109/85 (07/10 1300) SpO2:  [92 %-100 %] 98 % (07/10 1300) Constitutional:  , ill appearing HENT: [place]/AT, PERRLA, no scleral icterus, Mouth/Throat: dry mm Cardiovascular:Tachy, 2/6 sm Pulmonary/Chest: dec bs bil bases Neck = supple, no nuchal rigidity Abdominal: Soft. Bowel sounds are normal.  exhibits no distension. There is no tenderness. Rectal tube Lymphadenopathy: no cervical adenopathy. No axillary adenopathy Neurological: intubated and sedated HD cath RLE  Ext 3+ edema bil le Skin: bruising on L inner thigh, Bil LE wrapped but Dr Alva Garnet and RN describe ruptured bullae Psychiatric: unable to obtain   Lab Results  Recent Labs  04/16/17 0550  04/17/17 0452 04/17/17 0817  WBC 33.4*  --  30.8*  --   HGB 7.8*  --  7.6*  --   HCT 23.2*  --  23.1*  --   NA  --   < > 135 135  K  --   < > 4.9 4.8  CL  --   < > 102 102  CO2  --   < > 25 24  BUN  --   < >  27* 28*  CREATININE  --   < > 0.47 0.65  < > = values in this interval not displayed.  Microbiology: Results for orders placed or performed during the hospital encounter of 04/08/17  Culture, blood (routine x 2)     Status: None   Collection Time: 04/08/17  2:04 AM  Result Value Ref Range Status   Specimen Description BLOOD RIGHT ANTECUBITAL  Final   Special Requests   Final    BOTTLES DRAWN AEROBIC AND ANAEROBIC Blood Culture adequate volume   Culture NO GROWTH 5 DAYS  Final   Report Status 04/13/2017 FINAL  Final  Culture, blood (routine x 2)     Status: None   Collection Time: 04/08/17  2:06 AM  Result Value Ref Range Status   Specimen Description BLOOD LEFT ANTECUBITAL  Final   Special Requests    Final    BOTTLES DRAWN AEROBIC AND ANAEROBIC Blood Culture adequate volume   Culture NO GROWTH 5 DAYS  Final   Report Status 04/13/2017 FINAL  Final  C difficile quick scan w PCR reflex     Status: None   Collection Time: 04/08/17  5:09 AM  Result Value Ref Range Status   C Diff antigen NEGATIVE NEGATIVE Final   C Diff toxin NEGATIVE NEGATIVE Final   C Diff interpretation No C. difficile detected.  Final  MRSA PCR Screening     Status: None   Collection Time: 04/08/17  6:50 AM  Result Value Ref Range Status   MRSA by PCR NEGATIVE NEGATIVE Final    Comment:        The GeneXpert MRSA Assay (FDA approved for NASAL specimens only), is one component of a comprehensive MRSA colonization surveillance program. It is not intended to diagnose MRSA infection nor to guide or monitor treatment for MRSA infections.   Urine culture     Status: None   Collection Time: 04/08/17 12:16 PM  Result Value Ref Range Status   Specimen Description URINE, RANDOM  Final   Special Requests NONE  Final   Culture   Final    NO GROWTH Performed at [facility], [address] [place], [place] [address]    Report Status 04/10/2017 FINAL  Final  Aerobic Culture (superficial specimen)     Status: None   Collection Time: 04/09/17 11:32 AM  Result Value Ref Range Status   Specimen Description SKIN  Final   Special Requests NONE  Final   Gram Stain   Final    RARE WBC PRESENT, PREDOMINANTLY PMN NO SQUAMOUS EPITHELIAL CELLS SEEN NO ORGANISMS SEEN    Culture   Final    NO GROWTH 2 DAYS Performed at [facility], [place] [address] [place], [place] [address]    Report Status 04/11/2017 FINAL  Final  Gastrointestinal Panel by PCR , Stool     Status: None   Collection Time: 04/09/17  5:10 PM  Result Value Ref Range Status   Campylobacter species NOT DETECTED NOT DETECTED Final   Plesimonas shigelloides NOT DETECTED NOT DETECTED Final   Salmonella species NOT DETECTED NOT DETECTED Final    Yersinia enterocolitica NOT DETECTED NOT DETECTED Final   Vibrio species NOT DETECTED NOT DETECTED Final   Vibrio cholerae NOT DETECTED NOT DETECTED Final   Enteroaggregative E coli (EAEC) NOT DETECTED NOT DETECTED Final   Enteropathogenic E coli (EPEC) NOT DETECTED NOT DETECTED Final   Enterotoxigenic E coli (ETEC) NOT DETECTED NOT DETECTED Final   Shiga  like toxin producing E coli (STEC) NOT DETECTED NOT DETECTED Final   Shigella/Enteroinvasive E coli (EIEC) NOT DETECTED NOT DETECTED Final   Cryptosporidium NOT DETECTED NOT DETECTED Final   Cyclospora cayetanensis NOT DETECTED NOT DETECTED Final   Entamoeba histolytica NOT DETECTED NOT DETECTED Final   Giardia lamblia NOT DETECTED NOT DETECTED Final   Adenovirus F40/41 NOT DETECTED NOT DETECTED Final   Astrovirus NOT DETECTED NOT DETECTED Final   Norovirus GI/GII NOT DETECTED NOT DETECTED Final   Rotavirus A NOT DETECTED NOT DETECTED Final   Sapovirus (I, II, IV, and V) NOT DETECTED NOT DETECTED Final  Culture, respiratory (NON-Expectorated)     Status: None   Collection Time: 04/12/17 12:00 PM  Result Value Ref Range Status   Specimen Description TRACHEAL ASPIRATE  Final   Special Requests NONE  Final   Gram Stain   Final    MODERATE WBC PRESENT,BOTH PMN AND MONONUCLEAR MODERATE YEAST Performed at [facility], [address], [place], [place] [address]    Culture MODERATE CANDIDA TROPICALIS  Final   Report Status 04/14/2017 FINAL  Final  Culture, respiratory (NON-Expectorated)     Status: None (Preliminary result)   Collection Time: 04/15/17  7:48 AM  Result Value Ref Range Status   Specimen Description TRACHEAL ASPIRATE  Final   Special Requests Normal  Final   Gram Stain   Final    RARE WBC PRESENT, PREDOMINANTLY PMN FEW BUDDING YEAST SEEN Performed at [facility], [place] [address], [place], [place] [address]    Culture ABUNDANT CANDIDA TROPICALIS  Final   Report Status PENDING  Incomplete  CULTURE, BLOOD  (ROUTINE X 2) w Reflex to ID Panel     Status: None (Preliminary result)   Collection Time: 04/15/17 10:46 AM  Result Value Ref Range Status   Specimen Description BLOOD LEFT SUBCLAVIAN TRIPLE LUMEN  Final   Special Requests   Final    BOTTLES DRAWN AEROBIC AND ANAEROBIC Blood Culture results may not be optimal due to an excessive volume of blood received in culture bottles   Culture NO GROWTH 2 DAYS  Final   Report Status PENDING  Incomplete  CULTURE, BLOOD (ROUTINE X 2) w Reflex to ID Panel     Status: None (Preliminary result)   Collection Time: 04/16/17  1:21 PM  Result Value Ref Range Status   Specimen Description BLOOD BLOOD LEFT HAND  Final   Special Requests   Final    BOTTLES DRAWN AEROBIC AND ANAEROBIC Blood Culture results may not be optimal due to an inadequate volume of blood received in culture bottles   Culture NO GROWTH < 24 HOURS  Final   Report Status PENDING  Incomplete  CULTURE, BLOOD (ROUTINE X 2) w Reflex to ID Panel     Status: None (Preliminary result)   Collection Time: 04/16/17  1:27 PM  Result Value Ref Range Status   Specimen Description BLOOD A-LINE DRAW  Final   Special Requests   Final    BOTTLES DRAWN AEROBIC AND ANAEROBIC Blood Culture adequate volume   Culture NO GROWTH < 24 HOURS  Final   Report Status PENDING  Incomplete  C difficile quick scan w PCR reflex     Status: None   Collection Time: 04/16/17 10:15 PM  Result Value Ref Range Status   C Diff antigen NEGATIVE NEGATIVE Final   C Diff toxin NEGATIVE NEGATIVE Final   C Diff interpretation No C. difficile detected.  Final  Comment: VALID    Studies/Results: Dg Chest Port 1 View  Result Date: 04/17/2017 CLINICAL DATA:  Respiratory failure EXAM: PORTABLE CHEST 1 VIEW COMPARISON:  April 14, 2017 FINDINGS: Central catheter tip is in the superior vena cava. Endotracheal tube and nasogastric tube have been removed. No pneumothorax. There is interstitial pulmonary edema with pleural effusions  bilaterally. There is patchy airspace consolidation in the left lower lobe. Heart is enlarged with mild pulmonary venous hypertension. No appreciable adenopathy. No bone lesions. IMPRESSION: No pneumothorax. Central catheter position unchanged. Evidence of underlying congestive heart failure. Superimposed pneumonia in the left lower lobe cannot be excluded radiographically. There appears to be slight increase in opacity in the left base compared to recent study. Electronically Signed   By: Lowella Grip III M.D.   On: 04/17/2017 07:07   Study Conclusions  - Left ventricle: The cavity size was mildly dilated. There was   mild concentric hypertrophy. Systolic function was severely   reduced. The estimated ejection fraction was in the range of 25%   to 30%. - Aortic valve: There was moderate regurgitation. - Mitral valve: There was mild regurgitation. - Left atrium: The atrium was mildly dilated. - Right atrium: The atrium was mildly dilated. - Tricuspid valve: There was moderate regurgitation.  Assessment/Plan: Glynnis Gavel is a 66 y.o. female admitted with 1-2 months of progressive LE edema, progressive to bullae and cellulitis, hypotension as well as bil pleural effusions. Labs are significant for coagulopathy, TCP,  elevated BNP, acute renal failure, low albumin, elevated lactate, proteinuria, marked elevated procalcitonin.  WBC only mildly elevated, no fevers. [place] neg.   Her strep PNA urinary ag is also + but bcx pending. CT scan done a few days prior to admit shows effusion, possible R consolidation, gallstones and subq edema on Abd wall.   She certainly has LE cellulitis  but this is related to the edema and not the primary underlying etiology. Gram stain is neg on fluid from bullae. Also likely PNA based on CXR and urinary ag + S pna.  Echo shows severe systolic CHF, Mod AR, Mod TR.   She has not unusual exposures, no tick exposures. Endocarditis is a possibility but echo and bcx neg so  far.  BCX ucx neg. HIV neg, ESR 21. C diff and stool PCR neg.  She coded and was now intubated and on 3 pressors. No obvious etiology of the renal failure and CHF.   LFTS very high from shock liver.  HGB dropped impressively. Stool heme +.  Procalcitonin decreasing.  July 9 - extubated  But still on CRRT and pressors, WBC increased. Some diarrhea. July 10- C diff neg. Wbc down some  Day 11 of zosyn Day 5 of vanco   Recommendations Would stop abx at this point and monitor.  Thank you very much for the consult. Will follow with you.  [place], Yandel Zeiner P   04/17/2017, 2:24 PM

## 2017-04-17 NOTE — Progress Notes (Signed)
Plans to increase UF from 53ml/hr to 88ml/hr during shift discussed with Hinton Dyer, NP at start of shift.  UF slowly increased by 63ml every hour to goal of 72ml/hr.  Goal reached at midnight. Patient tolerating increased UF; BP stable.  Levo turned off at 0330.   Venous pressures increased with suspected CRRT filter clotting.  Blood returned to patient with no issues and cartridge changed.  CRRT resumed at 0600.  Report given to [place], RN

## 2017-04-17 NOTE — Progress Notes (Signed)
Nutrition Brief Follow-up  DOCUMENTATION CODES:   Obesity unspecified  INTERVENTION:  Continue Magic cup TID with meals, each supplement provides 290 kcal and 9 grams of protein.  Provide Ensure Enlive po TID between meals, each supplement provides 350 kcal and 20 grams of protein.   ASSESSMENT:   66 yo female with PMH of Chronic A-Fib, Hypothyroidism, presents with LE Edema, blistering lesions on BLE, progressive weakness, cardiogenic shock vs septic shock, Severe Sepsis, cellulitis, AKI. PEA arrested 7/4 w/ ROSC within 2 mins following ACLS protocol  Patient seen for follow-up yesterday after extubation. Per discussion in rounds today patient is not eating well. When diet was advanced to regular, Magic Cup was added to all meals. RN reports patient only eating bites of meals and Magic Cup. She drank about 1/3 of Ensure today.   Spoke with patient and husband at bedside. Patient reports she just does not feel any hunger (anorexia) and does not want to eat. Had some abdominal pain yesterday. Denies any abdominal pain, N/V, difficulty chewing/swallowing today.  Diet Order:  Diet regular Room service appropriate? Yes; Fluid consistency: Thin  Willey Blade, MS, RD, LDN Pager: [phone] After Hours Pager: [phone]

## 2017-04-17 NOTE — Progress Notes (Signed)
1640 Good day. Slowly increasing oral intake. Diarrhea slowing but flexiseal still needed. 3 Small broken areas noted around rectum. Cleansed and treated with silvadene cream. Left lower leg wound ( from cellulitus blisters) healing nicely . Patient remains with +2  edema in extremities. Encouraged to move self in bed and no pain noted with range of motion exercises. CRRT ultrafiltration increased to 125 and blood flow increased to 350. Maintained new settings x 4 hours but unable to maintain high rates. Trialysis catheter flushed with saline but no clotts retrieved. Ultrafiltration rated 75 and blood flow 300 resumed without issues. No comp[laints of pain from patient all day.Husband in and out.

## 2017-04-17 NOTE — Progress Notes (Signed)
PULMONARY / CRITICAL CARE MEDICINE   Name: [name] MRN: [id number] DOB: 02/14/1951    ADMISSION DATE:  04/08/2017  PT PROFILE:   66 y.o. F with PMH of CAF, hypothyroidism admitted via ED to ICU with many days of progressive LE edema, blistering lesions on BLE, progressive weakness. Adm diagnosis of septic shock, cellulitis, AKI.  Pt PEA arrested 07/4 ACLS protocol initiated ROSC within 2 minutes following interventions intubated during cardiac arrest and extubated 07/9.   MAJOR EVENTS/TEST RESULTS: 07/01 LE venous US: no DVT 07/01 Echocardiogram:  LVEF 25-30% 07/01 Nephrology consultation 07/02 ID consultation 07/02 CRRT initiated 07/03 Off vasopressors. Worsening tachycardia - amiodarone initiated 07/04 Pt PEA arrest ACLS protocol initiated ROSC 2 minutes now back on vasopressors  07/05 Intubated, shock req high dose vasopressors, on CRRT, elevated LFTs c/w congestion/shock liver, pulmonary edema vs ARDS on CXR 07/9 Pt extubated   INDWELLING DEVICES:: R fem HD cath 07/02 >>  ETT 07/04 >>  Left IJ 07/04 >>   MICRO DATA: MRSA PCR 07/01 >> NEG Urine 07/01 >> NEG Blood 07/01 >> NEG C diff 07/01 >> NEG GI panel 07/02 >> NEG Resp 07/05 >> moderate candida tropicalis  Blood x2 07/8 >> NEG >> Resp 07/8 >> Cdiff 07/9 >> NEG   ANTIMICROBIALS:  Vanc 07/01 >> 07/02 Pip-tazo 07/01 >>  Vancomycin 07/6 >>  SUBJECTIVE:  Pt remains extubated on RA per RN levophed gtt weaned off vasopressin gtt remains infusing no acute issues overnight.  VITAL SIGNS: BP 108/85 (BP Location: Right Arm)   Pulse (!) 114   Temp 98.3 F (36.8 C) (Oral)   Resp 18   Ht 5\' 5"  (1.651 m)   Wt 93.4 kg (205 lb 13.1 oz)   SpO2 97%   BMI 34.25 kg/m   VENTILATOR SETTINGS: Vent Mode: PSV FiO2 (%):  [30 %] 30 % Set Rate:  [16 bmp] 16 bmp Vt Set:  [500 mL] 500 mL PEEP:  [5 cmH20] 5 cmH20 Pressure Support:  [5 cmH20] 5 cmH20  INTAKE / OUTPUT: I/O last 3 completed shifts: In: 3778.4 [I.V.:1350.4;  Other:200; NW/GN:5621; IV Piggyback:1060] Out: 2064 [Urine:5; Other:1809; Stool:250]  PHYSICAL EXAMINATION: General: acutely ill appearing Caucasian female Neuro: lethargic following commands, confused to time and situation at times, PERRL HEENT: supple, no JVD jaundice bilateral pupils Cardiovascular: IRIR, tachy, no M/R/G Lungs: rhonchi and diminished throughout, even, non labored; no wheezes, rales, or crackles    Abdomen: obese, soft, ND Ext: severe BLE edema from hips down, multiple bilateral bullous lesions  LABS:  BMET  Recent Labs Lab 04/16/17 0938 04/16/17 1708 04/17/17 0113  NA 137 136 136  K 4.5 4.9 4.8  CL 103 102 102  CO2 26 25 26   BUN 26* 26* 27*  CREATININE 0.44 0.58 0.44  GLUCOSE 109* 163* 153*    Electrolytes  Recent Labs Lab 04/16/17 0938 04/16/17 1708 04/17/17 0113  CALCIUM 8.0* 8.2* 8.4*  MG 1.6* 1.9 1.8  PHOS 3.0 2.6 2.4*    CBC  Recent Labs Lab 04/15/17 0500 04/15/17 1352 04/16/17 0550  WBC 25.9* 27.6* 33.4*  HGB 8.0* 7.4* 7.8*  HCT 24.0* 22.4* 23.2*  PLT 50* 42* 56*    Coag's  Recent Labs Lab 04/10/17 0534  04/11/17 1847  04/14/17 0523 04/15/17 0500 04/15/17 1352 04/15/17 2046  APTT 107*  < > 141*  < > 46* 42*  39*  --  108*  INR 2.65  --  4.27*  --   --   --  1.49  --   < > = values in this interval not displayed.  Sepsis Markers  Recent Labs Lab 04/10/17 0533  04/13/17 0427 04/14/17 0523 04/16/17 1146  LATICACIDVEN 2.7*  --   --   --   --   PROCALCITON  --   < > 12.33 16.09 19.26  < > = values in this interval not displayed.  ABG  Recent Labs Lab 04/12/17 0500 04/12/17 0935 04/16/17 1100  PHART 7.29* 7.33* 7.43  PCO2ART 35 37 42  PO2ART 91 86 106    Liver Enzymes  Recent Labs Lab 04/14/17 1422  04/15/17 0500  04/16/17 0550 04/16/17 0938 04/16/17 1708 04/17/17 0113  AST 762*  --  469*  --  219*  --   --   --   ALT 1,297*  --  1,172*  --  752*  --   --   --   ALKPHOS 482*  --  606*  --   608*  --   --   --   BILITOT 5.8*  --  5.4*  --  4.3*  --   --   --   ALBUMIN 2.7*  2.8*  < > 2.8*  < > 2.5* 2.3* 2.9* 3.0*  < > = values in this interval not displayed.  Cardiac Enzymes  Recent Labs Lab 04/12/17 0732 04/12/17 1217  TROPONINI 0.09* 0.08*    Glucose  Recent Labs Lab 04/16/17 0809 04/16/17 1119 04/16/17 1653 04/16/17 2002 04/16/17 2349 04/17/17 0351  GLUCAP 106* 108* 165* 150* 147* 146*    CXR: NSC moderate R pleural effusion  ASSESSMENT / PLAN:  Acute hypoxic respiratory failure secondary to pulmonary edema and possible superimposed pneumonia  increasing white count and PCT's: trend wbc and monitor fever curve, follow cultures, continue vancomycin and zosyn, ID consulted appreciate input   PEA arrest likely secondary to severe cardiomyopathy   Cardiogenic shock: prn levophed and vasopressin gtt to maintain map goal >65  Chronic AF: continue amiodarone gtt transition to po amiodarone once pt able to tolerate po medications  Renal failure:  continue CRRT and albumin per Nephrology recommendations, Trend BMP, Monitor UOP,   Diarrhea (Cdiff negative 07/9): continue flexi-seal and monitor electrolytes    Anemia without evidence of acute blood loss: trend CBC, monitor for s/sx of bleeding, SCD's for VTE prophylaxis no chemical anticoagulation for now   Transaminitis:  improved, most likely consistent with shock liver  Thrombocytopenia no evidence of active bleeding: trend platelet count   Cellulitis: On broad-spectrum coverage   FAMILY  Updates: No family currently at bedside 07/10, however I did speak with pts husband regarding plan of care and answered all questions 07/9. I also encouraged pts husband to bring in the pts favorite food from home due to poor po intake during this hospitalization.     [name], [place] Pager [phone] (please enter 7 digits) PCCM Consult Pager [phone] (please enter 7  digits)

## 2017-04-17 NOTE — Progress Notes (Signed)
[facility]  ROUNDING NOTE   Subjective:   Patient remains critically ill.  Extubated now Continued on CRRT UF at currently 75 cc an hour Patient requiring low dose vasopressin, amiodarone IV drip   Objective:  Vital signs in last 24 hours:  Temp:  [96.8 F (36 C)-97.6 F (36.4 C)] 96.8 F (36 C) (07/10 1600) Pulse Rate:  [99-121] 115 (07/10 1500) Resp:  [18-27] 22 (07/10 1500) BP: (92-129)/(62-94) 120/73 (07/10 1600) SpO2:  [92 %-100 %] 94 % (07/10 1500)  Weight change:  Filed Weights   04/14/17 0500 04/15/17 0500 04/16/17 0400  Weight: 90.5 kg (199 lb 8.3 oz) 91.7 kg (202 lb 2.6 oz) 93.4 kg (205 lb 13.1 oz)    Intake/Output: I/O last 3 completed shifts: In: 3269.5 [I.V.:1431.5; Other:200; NG/GT:728; IV QZESPQZRA:076] Out: 2134 [Urine:5; AUQJF:3545; Stool:250]   Intake/Output this shift:  Total I/O In: 451.4 [P.O.:277; I.V.:114.4; Other:60] Out: 60 [Other:579]  Physical Exam: General: Critically ill  Head: Normocephalic, atraumatic  Eyes: Anicteric,   Neck: No masses   Lungs:  Bilateral mild crackles at bases  Heart: Irregular, tachycardia  Abdomen:  Soft, nontender, obese  Extremities: Anasarca with massive lower extremity edema. Bilateral dressings - clean and dry.   Neurologic: Alert, able to follow commands  Skin: Bullous lesions with serous anginous drainage  Access: Right femoral temp HD catheter Dr. Lucky Cowboy 7/2    Basic Metabolic Panel:  Recent Labs Lab 04/16/17 0313 04/16/17 0550 04/16/17 6256 04/16/17 1708 04/17/17 0113 04/17/17 0452 04/17/17 0817  NA 135  --  137 136 136 135 135  K 4.6  --  4.5 4.9 4.8 4.9 4.8  CL 102  --  103 102 102 102 102  CO2 28  --  26 25 26 25 24   GLUCOSE 109*  --  109* 163* 153* 145* 148*  BUN 25*  --  26* 26* 27* 27* 28*  CREATININE 0.56  --  0.44 0.58 0.44 0.47 0.65  CALCIUM 8.0*  --  8.0* 8.2* 8.4* 8.4* 8.4*  MG 1.7  --  1.6* 1.9 1.8  --  1.9  PHOS 2.9 3.3 3.0 2.6 2.4*  --  2.5    Liver  Function Tests:  Recent Labs Lab 04/13/17 0427  04/14/17 1422  04/15/17 0500  04/16/17 0550 04/16/17 0938 04/16/17 1708 04/17/17 0113 04/17/17 0452 04/17/17 0817  AST 2,736*  --  762*  --  469*  --  219*  --   --   --  133*  --   ALT 2,237*  --  1,297*  --  1,172*  --  752*  --   --   --  567*  --   ALKPHOS 395*  --  482*  --  606*  --  608*  --   --   --  598*  --   BILITOT 7.0*  --  5.8*  --  5.4*  --  4.3*  --   --   --  4.7*  --   PROT 5.3*  --  4.7*  --  4.9*  --  4.7*  --   --   --  5.5*  --   ALBUMIN 3.4*  < > 2.7*  2.8*  < > 2.8*  < > 2.5* 2.3* 2.9* 3.0* 3.1* 3.2*  < > = values in this interval not displayed. No results for input(s): LIPASE, AMYLASE in the last 168 hours. No results for input(s): AMMONIA in the last 168 hours.  CBC:  Recent Labs Lab 04/12/17 1852 04/13/17 0753 04/14/17 0523 04/15/17 0500 04/15/17 1352 04/16/17 0550 04/17/17 0452  WBC 14.1* 19.0* 15.4* 25.9* 27.6* 33.4* 30.8*  NEUTROABS 12.7* 17.3* 13.2*  --  24.8*  --  28.0*  HGB 7.9* 8.1* 7.4* 8.0* 7.4* 7.8* 7.6*  HCT 24.3* 25.0* 22.2* 24.0* 22.4* 23.2* 23.1*  MCV 93.5 91.6 89.9 91.4 90.7 92.2 92.8  PLT 103* 81* 43* 50* 42* 56* 42*    Cardiac Enzymes:  Recent Labs Lab 04/12/17 0732 04/12/17 1217  TROPONINI 0.09* 0.08*    BNP: Invalid input(s): POCBNP  CBG:  Recent Labs Lab 04/16/17 2349 04/17/17 0351 04/17/17 0730 04/17/17 1235 04/17/17 1625  GLUCAP 147* 146* 147* 148* 101*    Microbiology: Results for orders placed or performed during the hospital encounter of 04/08/17  Culture, blood (routine x 2)     Status: None   Collection Time: 04/08/17  2:04 AM  Result Value Ref Range Status   Specimen Description BLOOD RIGHT ANTECUBITAL  Final   Special Requests   Final    BOTTLES DRAWN AEROBIC AND ANAEROBIC Blood Culture adequate volume   Culture NO GROWTH 5 DAYS  Final   Report Status 04/13/2017 FINAL  Final  Culture, blood (routine x 2)     Status: None   Collection  Time: 04/08/17  2:06 AM  Result Value Ref Range Status   Specimen Description BLOOD LEFT ANTECUBITAL  Final   Special Requests   Final    BOTTLES DRAWN AEROBIC AND ANAEROBIC Blood Culture adequate volume   Culture NO GROWTH 5 DAYS  Final   Report Status 04/13/2017 FINAL  Final  C difficile quick scan w PCR reflex     Status: None   Collection Time: 04/08/17  5:09 AM  Result Value Ref Range Status   C Diff antigen NEGATIVE NEGATIVE Final   C Diff toxin NEGATIVE NEGATIVE Final   C Diff interpretation No C. difficile detected.  Final  MRSA PCR Screening     Status: None   Collection Time: 04/08/17  6:50 AM  Result Value Ref Range Status   MRSA by PCR NEGATIVE NEGATIVE Final    Comment:        The GeneXpert MRSA Assay (FDA approved for NASAL specimens only), is one component of a comprehensive MRSA colonization surveillance program. It is not intended to diagnose MRSA infection nor to guide or monitor treatment for MRSA infections.   Urine culture     Status: None   Collection Time: 04/08/17 12:16 PM  Result Value Ref Range Status   Specimen Description URINE, RANDOM  Final   Special Requests NONE  Final   Culture   Final    NO GROWTH Performed at [facility], [address], [place], [place] [address]    Report Status 04/10/2017 FINAL  Final  Aerobic Culture (superficial specimen)     Status: None   Collection Time: 04/09/17 11:32 AM  Result Value Ref Range Status   Specimen Description SKIN  Final   Special Requests NONE  Final   Gram Stain   Final    RARE WBC PRESENT, PREDOMINANTLY PMN NO SQUAMOUS EPITHELIAL CELLS SEEN NO ORGANISMS SEEN    Culture   Final    NO GROWTH 2 DAYS Performed at [facility], [place] [address] [place], [place] [address]    Report Status 04/11/2017 FINAL  Final  Gastrointestinal Panel by PCR , Stool     Status: None   Collection  Time: 04/09/17  5:10 PM  Result Value Ref Range Status   Campylobacter species NOT DETECTED NOT  DETECTED Final   Plesimonas shigelloides NOT DETECTED NOT DETECTED Final   Salmonella species NOT DETECTED NOT DETECTED Final   Yersinia enterocolitica NOT DETECTED NOT DETECTED Final   Vibrio species NOT DETECTED NOT DETECTED Final   Vibrio cholerae NOT DETECTED NOT DETECTED Final   Enteroaggregative E coli (EAEC) NOT DETECTED NOT DETECTED Final   Enteropathogenic E coli (EPEC) NOT DETECTED NOT DETECTED Final   Enterotoxigenic E coli (ETEC) NOT DETECTED NOT DETECTED Final   Shiga like toxin producing E coli (STEC) NOT DETECTED NOT DETECTED Final   Shigella/Enteroinvasive E coli (EIEC) NOT DETECTED NOT DETECTED Final   Cryptosporidium NOT DETECTED NOT DETECTED Final   Cyclospora cayetanensis NOT DETECTED NOT DETECTED Final   Entamoeba histolytica NOT DETECTED NOT DETECTED Final   Giardia lamblia NOT DETECTED NOT DETECTED Final   Adenovirus F40/41 NOT DETECTED NOT DETECTED Final   Astrovirus NOT DETECTED NOT DETECTED Final   Norovirus GI/GII NOT DETECTED NOT DETECTED Final   Rotavirus A NOT DETECTED NOT DETECTED Final   Sapovirus (I, II, IV, and V) NOT DETECTED NOT DETECTED Final  Culture, respiratory (NON-Expectorated)     Status: None   Collection Time: 04/12/17 12:00 PM  Result Value Ref Range Status   Specimen Description TRACHEAL ASPIRATE  Final   Special Requests NONE  Final   Gram Stain   Final    MODERATE WBC PRESENT,BOTH PMN AND MONONUCLEAR MODERATE YEAST Performed at [facility], [address], [place], [place] [address]    Culture MODERATE CANDIDA TROPICALIS  Final   Report Status 04/14/2017 FINAL  Final  Culture, respiratory (NON-Expectorated)     Status: None (Preliminary result)   Collection Time: 04/15/17  7:48 AM  Result Value Ref Range Status   Specimen Description TRACHEAL ASPIRATE  Final   Special Requests Normal  Final   Gram Stain   Final    RARE WBC PRESENT, PREDOMINANTLY PMN FEW BUDDING YEAST SEEN Performed at [facility], [place] [address], [place], [place] [address]    Culture ABUNDANT CANDIDA TROPICALIS  Final   Report Status PENDING  Incomplete  CULTURE, BLOOD (ROUTINE X 2) w Reflex to ID Panel     Status: None (Preliminary result)   Collection Time: 04/15/17 10:46 AM  Result Value Ref Range Status   Specimen Description BLOOD LEFT SUBCLAVIAN TRIPLE LUMEN  Final   Special Requests   Final    BOTTLES DRAWN AEROBIC AND ANAEROBIC Blood Culture results may not be optimal due to an excessive volume of blood received in culture bottles   Culture NO GROWTH 2 DAYS  Final   Report Status PENDING  Incomplete  CULTURE, BLOOD (ROUTINE X 2) w Reflex to ID Panel     Status: None (Preliminary result)   Collection Time: 04/16/17  1:21 PM  Result Value Ref Range Status   Specimen Description BLOOD BLOOD LEFT HAND  Final   Special Requests   Final    BOTTLES DRAWN AEROBIC AND ANAEROBIC Blood Culture results may not be optimal due to an inadequate volume of blood received in culture bottles   Culture NO GROWTH 1 DAY  Final   Report Status PENDING  Incomplete  CULTURE, BLOOD (ROUTINE X 2) w Reflex to ID Panel     Status: None (Preliminary result)   Collection Time: 04/16/17  1:27 PM  Result Value Ref Range Status  Specimen Description BLOOD A-LINE DRAW  Final   Special Requests   Final    BOTTLES DRAWN AEROBIC AND ANAEROBIC Blood Culture adequate volume   Culture NO GROWTH 1 DAY  Final   Report Status PENDING  Incomplete  C difficile quick scan w PCR reflex     Status: None   Collection Time: 04/16/17 10:15 PM  Result Value Ref Range Status   C Diff antigen NEGATIVE NEGATIVE Final   C Diff toxin NEGATIVE NEGATIVE Final   C Diff interpretation No C. difficile detected.  Final    Comment: VALID    Coagulation Studies:  Recent Labs  04/15/17 1352  LABPROT 18.2*  INR 1.49    Urinalysis: No results for input(s): COLORURINE, LABSPEC, PHURINE, GLUCOSEU, HGBUR, BILIRUBINUR, KETONESUR, PROTEINUR, UROBILINOGEN, NITRITE, LEUKOCYTESUR  in the last 72 hours.  Invalid input(s): APPERANCEUR    Imaging: Dg Chest Port 1 View  Result Date: 04/17/2017 CLINICAL DATA:  Respiratory failure EXAM: PORTABLE CHEST 1 VIEW COMPARISON:  April 14, 2017 FINDINGS: Central catheter tip is in the superior vena cava. Endotracheal tube and nasogastric tube have been removed. No pneumothorax. There is interstitial pulmonary edema with pleural effusions bilaterally. There is patchy airspace consolidation in the left lower lobe. Heart is enlarged with mild pulmonary venous hypertension. No appreciable adenopathy. No bone lesions. IMPRESSION: No pneumothorax. Central catheter position unchanged. Evidence of underlying congestive heart failure. Superimposed pneumonia in the left lower lobe cannot be excluded radiographically. There appears to be slight increase in opacity in the left base compared to recent study. Electronically Signed   By: Lowella Grip III M.D.   On: 04/17/2017 07:07     Medications:   . albumin human    . amiodarone 30 mg/hr (04/17/17 0830)  . norepinephrine (LEVOPHED) Adult infusion Stopped (04/17/17 0330)  . phenylephrine (NEO-SYNEPHRINE) Adult infusion Stopped (04/16/17 1204)  . pureflow 2,000 mL/hr at 04/17/17 1005  . vasopressin (PITRESSIN) infusion - *FOR SHOCK* 0.03 Units/min (04/17/17 1110)   . feeding supplement (ENSURE ENLIVE)  237 mL Oral TID BM  . ipratropium-albuterol  3 mL Nebulization Q6H  . [START ON 04/18/2017] levothyroxine  50 mcg Oral QAC breakfast  . mouth rinse  15 mL Mouth Rinse BID  . [START ON 04/18/2017] pantoprazole  40 mg Oral Daily  . sertraline  25 mg Oral Daily  . silver sulfADIAZINE   Topical BID   artificial tears, heparin, LORazepam, metoprolol tartrate, [DISCONTINUED] ondansetron **OR** ondansetron (ZOFRAN) IV, phenol, sodium chloride flush, zinc oxide  Assessment/ Plan:  Ms. Jamerica Snavely is a 66 y.o. white female  with hypertension, hypothyroidism, atrial fibrillation, who was admitted to  [facility] on 04/08/2017 for evaluation of increasing lower extremity edema.  Hospital course: Admitted on 04/08/2017 with significant peripheral edema and cellulitis. Started on CRRT for hypotension and anuric renal failure on 7/2. Patient weaned off CRRT on 7/4. However later that day, patient had cardiac arrest and coded. Intubated, sedated and restarted on three vasopressors. Restart on CRRT on 7/4.  2-D echo which shows EF 25-30%  1. Acute renal failure with proteinuria and hematuria: baseline creatinine of 0.62 09/2014.  Patient with massive peripheral edema, anasarca and hypoalbuminemia.  Recent IV contrast exposure on 04/04/17.  - Serologic work up: compliments normal, negative SPEP/UPEP, ANA negative, ANCA negative, GBM negative, negative hepatitis - Continue CVVHD. Attempt to increase UF to 125 mL/hr.  - continue IV albumin for oncotic support  2. 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 Echocardiogram with EF of 25-30% - fluid removal with CRT as tolerated   3. Acute respiratory failure Extubated 7/9 Doing well  4. 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 Hemoglobin A1c 6.2% - continue glucose control.    LOS: 9 Raechal Raben 7/10/20184:40 PM

## 2017-04-17 NOTE — Progress Notes (Signed)
[facility] for Electrolytes   Pharmacy consulted for electrolyte management for 66 yo female ICU patient requiring CRRT. Patient is s/p extubation on 7/9. Patient now with diarrhea.  Plan:  Patient ordered magnesium 2g IV X 1. Will replace for goal magnesium > 2.   Will recheck electrolytes with am labs.   No Known Allergies  Patient Measurements: Height: 5\' 5"  (165.1 cm) Weight: 205 lb 13.1 oz (93.4 kg) IBW/kg (Calculated) : 57   Vital Signs: Temp: 96.8 F (36 C) (07/10 1600) Temp Source: Axillary (07/10 1600) BP: 120/73 (07/10 1600) Pulse Rate: 115 (07/10 1500) Intake/Output from previous day: 07/09 0701 - 07/10 0700 In: 1692.1 [I.V.:844.1; NG/GT:248; IV Piggyback:600] Out: 1553 [Urine:5; Stool:250] Intake/Output from this shift: Total I/O In: 451.4 [P.O.:277; I.V.:114.4; Other:60] Out: 579 [Other:579]  Labs:  Recent Labs  04/15/17 0500  04/15/17 1352  04/15/17 2046  04/16/17 0550  04/16/17 1708 04/17/17 0113 04/17/17 0452 04/17/17 0817 04/17/17 1613  WBC 25.9*  --  27.6*  --   --   --  33.4*  --   --   --  30.8*  --   --   HGB 8.0*  --  7.4*  --   --   --  7.8*  --   --   --  7.6*  --   --   HCT 24.0*  --  22.4*  --   --   --  23.2*  --   --   --  23.1*  --   --   PLT 50*  --  42*  --   --   --  56*  --   --   --  42*  --   --   APTT 42*  39*  --   --   --  108*  --   --   --   --   --  35  --   --   CREATININE  --   < >  --   < > 0.53  < >  --   < > 0.58 0.44 0.47 0.65  --   MG  --   < >  --   < > 1.8  < >  --   < > 1.9 1.8  --  1.9 2.3  PHOS  --   < >  --   < > 2.1*  < > 3.3  < > 2.6 2.4*  --  2.5  --   ALBUMIN 2.8*  < >  --   < > 2.4*  < > 2.5*  < > 2.9* 3.0* 3.1* 3.2*  --   PROT 4.9*  --   --   --   --   --  4.7*  --   --   --  5.5*  --   --   AST 469*  --   --   --   --   --  219*  --   --   --  133*  --   --   ALT 1,172*  --   --   --   --   --  752*  --   --   --  567*  --   --   ALKPHOS 606*  --   --   --    --   --  608*  --   --   --  598*  --   --  BILITOT 5.4*  --   --   --   --   --  4.3*  --   --   --  4.7*  --   --   BILIDIR 2.9*  --   --   --   --   --  2.4*  --   --   --   --   --   --   IBILI 2.5*  --   --   --   --   --  1.9*  --   --   --   --   --   --   < > = values in this interval not displayed. Estimated Creatinine Clearance: 79.2 mL/min (by C-G formula based on SCr of 0.65 mg/dL).  Pharmacy will continue to monitor and adjust per consult.   Simpson,Michael L 04/17/2017,4:58 PM

## 2017-04-18 LAB — CBC WITH DIFFERENTIAL/PLATELET
BAND NEUTROPHILS: 6 %
BASOS ABS: 0 10*3/uL (ref 0–0.1)
BASOS PCT: 0 %
Blasts: 0 %
EOS ABS: 0 10*3/uL (ref 0–0.7)
EOS PCT: 0 %
HCT: 22.2 % — ABNORMAL LOW (ref 35.0–47.0)
Hemoglobin: 7.4 g/dL — ABNORMAL LOW (ref 12.0–16.0)
LYMPHS ABS: 2.2 10*3/uL (ref 1.0–3.6)
Lymphocytes Relative: 8 %
MCH: 31.4 pg (ref 26.0–34.0)
MCHC: 33.3 g/dL (ref 32.0–36.0)
MCV: 94.3 fL (ref 80.0–100.0)
METAMYELOCYTES PCT: 0 %
MONO ABS: 0 10*3/uL — AB (ref 0.2–0.9)
MYELOCYTES: 0 %
Monocytes Relative: 0 %
Neutro Abs: 25.7 10*3/uL — ABNORMAL HIGH (ref 1.4–6.5)
Neutrophils Relative %: 86 %
Other: 0 %
PLATELETS: 47 10*3/uL — AB (ref 150–440)
Promyelocytes Absolute: 0 %
RBC: 2.35 MIL/uL — ABNORMAL LOW (ref 3.80–5.20)
RDW: 16.8 % — ABNORMAL HIGH (ref 11.5–14.5)
WBC: 27.9 10*3/uL — ABNORMAL HIGH (ref 3.6–11.0)
nRBC: 2 /100 WBC — ABNORMAL HIGH

## 2017-04-18 LAB — RENAL FUNCTION PANEL
ANION GAP: 12 (ref 5–15)
ANION GAP: 10 (ref 5–15)
Albumin: 3.7 g/dL (ref 3.5–5.0)
Albumin: 3.9 g/dL (ref 3.5–5.0)
Albumin: 4 g/dL (ref 3.5–5.0)
Anion gap: 10 (ref 5–15)
BUN: 21 mg/dL — AB (ref 6–20)
BUN: 22 mg/dL — AB (ref 6–20)
BUN: 20 mg/dL (ref 6–20)
CALCIUM: 9.3 mg/dL (ref 8.9–10.3)
CALCIUM: 9 mg/dL (ref 8.9–10.3)
CHLORIDE: 105 mmol/L (ref 101–111)
CO2: 23 mmol/L (ref 22–32)
CO2: 24 mmol/L (ref 22–32)
CO2: 25 mmol/L (ref 22–32)
CREATININE: 0.55 mg/dL (ref 0.44–1.00)
Calcium: 8.9 mg/dL (ref 8.9–10.3)
Chloride: 102 mmol/L (ref 101–111)
Chloride: 104 mmol/L (ref 101–111)
Creatinine, Ser: 0.51 mg/dL (ref 0.44–1.00)
Creatinine, Ser: 0.61 mg/dL (ref 0.44–1.00)
GFR calc Af Amer: >60 mL/min (ref >60)
GFR calc Af Amer: >60 mL/min (ref >60)
GFR calc non Af Amer: >60 mL/min (ref >60)
GFR calc non Af Amer: >60 mL/min (ref >60)
GLUCOSE: 95 mg/dL (ref 65–99)
GLUCOSE: 104 mg/dL — AB (ref 65–99)
Glucose, Bld: 89 mg/dL (ref 65–99)
PHOSPHORUS: 1.9 mg/dL — AB (ref 2.5–4.6)
POTASSIUM: 4.5 mmol/L (ref 3.5–5.1)
POTASSIUM: 4.7 mmol/L (ref 3.5–5.1)
POTASSIUM: 4.8 mmol/L (ref 3.5–5.1)
Phosphorus: 1.8 mg/dL — ABNORMAL LOW (ref 2.5–4.6)
Phosphorus: 2.4 mg/dL — ABNORMAL LOW (ref 2.5–4.6)
SODIUM: 137 mmol/L (ref 135–145)
SODIUM: 139 mmol/L (ref 135–145)
Sodium: 139 mmol/L (ref 135–145)

## 2017-04-18 LAB — COMPREHENSIVE METABOLIC PANEL
ALK PHOS: 546 U/L — AB (ref 38–126)
ALT: 402 U/L — AB (ref 14–54)
AST: 95 U/L — AB (ref 15–41)
Albumin: 3.7 g/dL (ref 3.5–5.0)
Anion gap: 10 (ref 5–15)
BUN: 20 mg/dL (ref 6–20)
CHLORIDE: 103 mmol/L (ref 101–111)
CO2: 25 mmol/L (ref 22–32)
CREATININE: 0.53 mg/dL (ref 0.44–1.00)
Calcium: 9.1 mg/dL (ref 8.9–10.3)
GFR calc Af Amer: >60 mL/min (ref >60)
GFR calc non Af Amer: >60 mL/min (ref >60)
Glucose, Bld: 88 mg/dL (ref 65–99)
Potassium: 4.8 mmol/L (ref 3.5–5.1)
SODIUM: 138 mmol/L (ref 135–145)
Total Bilirubin: 5.9 mg/dL — ABNORMAL HIGH (ref 0.3–1.2)
Total Protein: 6.2 g/dL — ABNORMAL LOW (ref 6.5–8.1)

## 2017-04-18 LAB — GLUCOSE, CAPILLARY
Glucose-Capillary: 102 mg/dL — ABNORMAL HIGH (ref 65–99)
Glucose-Capillary: 106 mg/dL — ABNORMAL HIGH (ref 65–99)
Glucose-Capillary: 108 mg/dL — ABNORMAL HIGH (ref 65–99)
Glucose-Capillary: 109 mg/dL — ABNORMAL HIGH (ref 65–99)
Glucose-Capillary: 85 mg/dL (ref 65–99)
Glucose-Capillary: 92 mg/dL (ref 65–99)
Glucose-Capillary: 96 mg/dL (ref 65–99)

## 2017-04-18 LAB — CULTURE, RESPIRATORY: SPECIAL REQUESTS: NORMAL

## 2017-04-18 LAB — MAGNESIUM
MAGNESIUM: 1.8 mg/dL (ref 1.7–2.4)
MAGNESIUM: 2 mg/dL (ref 1.7–2.4)
Magnesium: 2 mg/dL (ref 1.7–2.4)
Magnesium: 2 mg/dL (ref 1.7–2.4)

## 2017-04-18 LAB — CULTURE, RESPIRATORY W GRAM STAIN

## 2017-04-18 LAB — PHOSPHORUS: PHOSPHORUS: 2.1 mg/dL — AB (ref 2.5–4.6)

## 2017-04-18 LAB — PROCALCITONIN: PROCALCITONIN: 19.49 ng/mL

## 2017-04-18 LAB — APTT: aPTT: 29 seconds (ref 24–36)

## 2017-04-18 NOTE — Progress Notes (Signed)
PT Cancellation Note  Patient Details Name: [name] MRN: [id number] DOB: 20-Feb-1951   Cancelled Treatment:    Reason Eval/Treat Not Completed: Medical issues which prohibited therapy (Pt's HR 122 resting comfortably in bed).  Additionally, pt with bed rest order.  Will need new activity orders once pt medically appropriate for OOB activity.  Collie Siad PT, DPT 04/18/2017, 2:35 PM

## 2017-04-18 NOTE — Progress Notes (Signed)
Patient ID: [name], female   DOB: 11/28/1950, 66 y.o.   MRN: [id number]    [facility] PROGRESS NOTE  Aleynah Rocchio RJJ:[id number] DOB: 02/12/1951 DOA: 04/08/2017 PCP: Tracie Harrier, MD  HPI/Subjective: Patient slow with her answers. Follow some commands. Able to wiggle toes and lift up her arms off the bed.  Objective: Vitals:   04/18/17 1600 04/18/17 1630  BP: 105/69 105/83  Pulse:  (!) 122  Resp: 19 (!) 26  Temp:      Filed Weights   04/15/17 0500 04/16/17 0400 04/18/17 0500  Weight: 91.7 kg (202 lb 2.6 oz) 93.4 kg (205 lb 13.1 oz) 89.9 kg (198 lb 3.1 oz)    ROS: Review of Systems  Constitutional: Negative for fever.  Respiratory: Negative for cough and shortness of breath.   Cardiovascular: Negative for chest pain.  Gastrointestinal: Positive for diarrhea. Negative for abdominal pain, nausea and vomiting.  Musculoskeletal: Negative for joint pain.  Neurological: Negative for headaches.    Exam: Physical Exam  HENT:  Head: Atraumatic.  Nose: No mucosal edema.  Mouth/Throat: No oropharyngeal exudate or posterior oropharyngeal edema.  Eyes: Lids are normal. Pupils are equal, round, and reactive to light.  Icteric sclera  Neck: Carotid bruit is not present. No thyromegaly present.  Cardiovascular: Regular rhythm, S1 normal, S2 normal and normal heart sounds.   Respiratory: She has decreased breath sounds in the right lower field and the left lower field. She has no wheezes. She has rhonchi in the left lower field. She has no rales.  GI: Soft. Bowel sounds are normal. There is no tenderness.  Musculoskeletal:       Right ankle: She exhibits swelling.       Left ankle: She exhibits swelling.  Lymphadenopathy:    She has no cervical adenopathy.  Neurological: She is alert.  Able to wiggle toes on the left foot. Not able to move the left leg. Able to move the right leg a little bit. Able to squeeze my hands bilaterally.  Skin: Skin is warm. No cyanosis.  Left  lower extremity covered today Right foot with some blisters that are healing Bruising right neck and left thigh  Psychiatric: Her affect is blunt.      Data Reviewed: Basic Metabolic Panel:  Recent Labs Lab 04/17/17 0817 04/17/17 1613 04/18/17 0010 04/18/17 0505 04/18/17 1427  NA 135 137 139 138 139  K 4.8 4.8 4.8 4.8 4.5  CL 102 103 104 103 105  CO2 24 24 25 25 24   GLUCOSE 148* 127* 104* 88 95  BUN 28* 26* 22* 20 21*  CREATININE 0.65 0.50 0.51 0.53 0.55  CALCIUM 8.4* 8.9 9.0 9.1 8.9  MG 1.9 2.3 2.0 2.0 1.8  PHOS 2.5 2.3* 2.4* 2.1* 1.8*   Liver Function Tests: CBC:  Recent Labs Lab 04/13/17 0753 04/14/17 0523 04/15/17 0500 04/15/17 1352 04/16/17 0550 04/17/17 0452 04/18/17 0505  WBC 19.0* 15.4* 25.9* 27.6* 33.4* 30.8* 27.9*  NEUTROABS 17.3* 13.2*  --  24.8*  --  28.0* 25.7*  HGB 8.1* 7.4* 8.0* 7.4* 7.8* 7.6* 7.4*  HCT 25.0* 22.2* 24.0* 22.4* 23.2* 23.1* 22.2*  MCV 91.6 89.9 91.4 90.7 92.2 92.8 94.3  PLT 81* 43* 50* 42* 56* 42* 47*   Cardiac Enzymes:  Recent Labs Lab 04/12/17 0732 04/12/17 1217  TROPONINI 0.09* 0.08*   BNP (last 3 results)  Recent Labs  04/08/17 0139  BNP 2,012.0*     CBG:  Recent Labs Lab 04/17/17 2019 04/18/17 0001 04/18/17  0175 04/18/17 0758 04/18/17 1322  GLUCAP 102* 106* 96 92 102*    Recent Results (from the past 240 hour(s))  Aerobic Culture (superficial specimen)     Status: None   Collection Time: 04/09/17 11:32 AM  Result Value Ref Range Status   Specimen Description SKIN  Final   Special Requests NONE  Final   Gram Stain   Final    RARE WBC PRESENT, PREDOMINANTLY PMN NO SQUAMOUS EPITHELIAL CELLS SEEN NO ORGANISMS SEEN    Culture   Final    NO GROWTH 2 DAYS Performed at [facility], [place] [address], [place], [place] [address]    Report Status 04/11/2017 FINAL  Final  Gastrointestinal Panel by PCR , Stool     Status: None   Collection Time: 04/09/17  5:10 PM  Result Value Ref Range Status    Campylobacter species NOT DETECTED NOT DETECTED Final   Plesimonas shigelloides NOT DETECTED NOT DETECTED Final   Salmonella species NOT DETECTED NOT DETECTED Final   Yersinia enterocolitica NOT DETECTED NOT DETECTED Final   Vibrio species NOT DETECTED NOT DETECTED Final   Vibrio cholerae NOT DETECTED NOT DETECTED Final   Enteroaggregative E coli (EAEC) NOT DETECTED NOT DETECTED Final   Enteropathogenic E coli (EPEC) NOT DETECTED NOT DETECTED Final   Enterotoxigenic E coli (ETEC) NOT DETECTED NOT DETECTED Final   Shiga like toxin producing E coli (STEC) NOT DETECTED NOT DETECTED Final   Shigella/Enteroinvasive E coli (EIEC) NOT DETECTED NOT DETECTED Final   Cryptosporidium NOT DETECTED NOT DETECTED Final   Cyclospora cayetanensis NOT DETECTED NOT DETECTED Final   Entamoeba histolytica NOT DETECTED NOT DETECTED Final   Giardia lamblia NOT DETECTED NOT DETECTED Final   Adenovirus F40/41 NOT DETECTED NOT DETECTED Final   Astrovirus NOT DETECTED NOT DETECTED Final   Norovirus GI/GII NOT DETECTED NOT DETECTED Final   Rotavirus A NOT DETECTED NOT DETECTED Final   Sapovirus (I, II, IV, and V) NOT DETECTED NOT DETECTED Final  Culture, respiratory (NON-Expectorated)     Status: None   Collection Time: 04/12/17 12:00 PM  Result Value Ref Range Status   Specimen Description TRACHEAL ASPIRATE  Final   Special Requests NONE  Final   Gram Stain   Final    MODERATE WBC PRESENT,BOTH PMN AND MONONUCLEAR MODERATE YEAST Performed at [facility], [address], [place], [place] [address]    Culture MODERATE CANDIDA TROPICALIS  Final   Report Status 04/14/2017 FINAL  Final  Culture, respiratory (NON-Expectorated)     Status: None   Collection Time: 04/15/17  7:48 AM  Result Value Ref Range Status   Specimen Description TRACHEAL ASPIRATE  Final   Special Requests Normal  Final   Gram Stain   Final    RARE WBC PRESENT, PREDOMINANTLY PMN FEW BUDDING YEAST SEEN Performed at [facility], [place] [address], [place], [place] [address]    Culture ABUNDANT CANDIDA TROPICALIS  Final   Report Status 04/18/2017 FINAL  Final  CULTURE, BLOOD (ROUTINE X 2) w Reflex to ID Panel     Status: None (Preliminary result)   Collection Time: 04/15/17 10:46 AM  Result Value Ref Range Status   Specimen Description BLOOD LEFT SUBCLAVIAN TRIPLE LUMEN  Final   Special Requests   Final    BOTTLES DRAWN AEROBIC AND ANAEROBIC Blood Culture results may not be optimal due to an excessive volume of blood received in culture bottles   Culture NO GROWTH 3 DAYS  Final   Report Status PENDING  Incomplete  CULTURE, BLOOD (ROUTINE X 2) w Reflex to ID Panel     Status: None (Preliminary result)   Collection Time: 04/16/17  1:21 PM  Result Value Ref Range Status   Specimen Description BLOOD BLOOD LEFT HAND  Final   Special Requests   Final    BOTTLES DRAWN AEROBIC AND ANAEROBIC Blood Culture results may not be optimal due to an inadequate volume of blood received in culture bottles   Culture NO GROWTH 2 DAYS  Final   Report Status PENDING  Incomplete  CULTURE, BLOOD (ROUTINE X 2) w Reflex to ID Panel     Status: None (Preliminary result)   Collection Time: 04/16/17  1:27 PM  Result Value Ref Range Status   Specimen Description BLOOD A-LINE DRAW  Final   Special Requests   Final    BOTTLES DRAWN AEROBIC AND ANAEROBIC Blood Culture adequate volume   Culture NO GROWTH 2 DAYS  Final   Report Status PENDING  Incomplete  C difficile quick scan w PCR reflex     Status: None   Collection Time: 04/16/17 10:15 PM  Result Value Ref Range Status   C Diff antigen NEGATIVE NEGATIVE Final   C Diff toxin NEGATIVE NEGATIVE Final   C Diff interpretation No C. difficile detected.  Final    Comment: VALID     Studies: Dg Chest Port 1 View  Result Date: 04/17/2017 CLINICAL DATA:  Respiratory failure EXAM: PORTABLE CHEST 1 VIEW COMPARISON:  April 14, 2017 FINDINGS: Central catheter tip is in the superior vena cava.  Endotracheal tube and nasogastric tube have been removed. No pneumothorax. There is interstitial pulmonary edema with pleural effusions bilaterally. There is patchy airspace consolidation in the left lower lobe. Heart is enlarged with mild pulmonary venous hypertension. No appreciable adenopathy. No bone lesions. IMPRESSION: No pneumothorax. Central catheter position unchanged. Evidence of underlying congestive heart failure. Superimposed pneumonia in the left lower lobe cannot be excluded radiographically. There appears to be slight increase in opacity in the left base compared to recent study. Electronically Signed   By: Lowella Grip III M.D.   On: 04/17/2017 07:07    Scheduled Meds: . feeding supplement (ENSURE ENLIVE)  237 mL Oral TID BM  . ipratropium-albuterol  3 mL Nebulization Q6H  . levothyroxine  50 mcg Oral QAC breakfast  . mouth rinse  15 mL Mouth Rinse BID  . pantoprazole  40 mg Oral Daily  . sertraline  25 mg Oral Daily  . silver sulfADIAZINE   Topical BID   Continuous Infusions: . albumin human    . amiodarone 30 mg/hr (04/18/17 1026)  . norepinephrine (LEVOPHED) Adult infusion Stopped (04/17/17 0330)  . phenylephrine (NEO-SYNEPHRINE) Adult infusion Stopped (04/16/17 1204)  . pureflow 3 each (04/18/17 1522)  . vasopressin (PITRESSIN) infusion - *FOR SHOCK* Stopped (04/18/17 0900)    Assessment/Plan:  1. Septic shock. Infectious disease specialist recommended stopping the antibiotics and monitoring. 2. PEA Cardiopulmonary arrest.  3. Shock liver from cardiopulmonary arrest. Liver function trending better. Total bilirubin is the only thing that has not improved. 4. 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 Patient still not making much urine.  5. Acute respiratory failure status post extubation and on nasal cannula 6. Hypomagnesemia, hypophosphatemia and hypokalemia. Resolved 7. Atrial fibrillation on amiodarone for rate control. Pradaxa on  hold. 8. Coagulopathy. Could be secondary to shock liver. 9. Diarrhea. Has rectal tube in. Stool for C. difficile negative  for 2 times testing. 10. Cardiomyopathy on echocardiogram. Blood pressure too low for any medications at this point 11. Thrombocytopenia could be secondary to sepsis. Continue to watch platelet count 12. Anemia- continue to watch hemoglobin closely 13. Leukocytosis. ID stop antibiotics. 14. Hypothyroidism unspecified on levothyroxine 15. Depression on Zoloft  Code Status:     Code Status Orders        Start     Ordered   04/08/17 0651  Full code  Continuous     04/08/17 0650    Code Status History    Date Active Date Inactive Code Status Order ID Comments User Context   This patient has a current code status but no historical code status.     Family Communication: Spoke with Husband at bedside Disposition Plan: To be determined based on clinical course  Consultants:  Critical care specialist  Infectious disease  Nephrology  Palliative care  Cardiology  Antibiotics:  Stopped  Time spent: 23 minutes  Loletha Grayer  [employer]

## 2017-04-18 NOTE — Progress Notes (Signed)
SLP Cancellation Note  Patient Details Name: [name] MRN: [id number] DOB: [birth year]   Cancelled treatment:       Reason Eval/Treat Not Completed:  (chart reviewed). ST services consulted. Pt has not been "motivated to eat" since extubation. Pt was admitted for sepsis then orally intubated on 04/11/17; extubated on 04/16/17. Dietician is following w/ supplement support. No reports of overt s/s of aspiration have been reported by NSG; chart.  ST services will f/u tomorrow during a meal. Recommend ordering foods/meals of interest for pt to increase desire. Recommend general aspiration precautions.     Orinda Kenner, MS, CCC-SLP Ihan Pat 04/18/2017, 4:22 PM

## 2017-04-18 NOTE — Progress Notes (Signed)
Slowly increased UF from 26ml/hr to goal of 157ml/hr throughout shift.  Pt tolerating current UF at goal rate.    Patient was a little more confused tonight than previous night with increased anxiety periodically throughout shift, however was easily consoled with verbal positive reinforcement.  Patient remains in afib 110s-130s; 1LNC with O2 sats 91-93%.  No urine output this shift.  Minimal output from rectal tube.

## 2017-04-18 NOTE — Progress Notes (Signed)
[facility]  ROUNDING NOTE   Subjective:   Patient remains critically ill.  Extubated now. [place] O2. States she ate a small breakfast Continued on CRRT UF at currently 125 cc an hour Patient requiring , amiodarone IV drip Off of pressors Net UF 2100 cc   Objective:  Vital signs in last 24 hours:  Temp:  [96 F (35.6 C)-98.5 F (36.9 C)] 97.7 F (36.5 C) (07/11 0756) Pulse Rate:  [51-161] 122 (07/11 1030) Resp:  [18-34] 21 (07/11 1030) BP: (90-133)/(61-98) 109/87 (07/11 1030) SpO2:  [90 %-100 %] 93 % (07/11 1030) Weight:  [89.9 kg (198 lb 3.1 oz)] 89.9 kg (198 lb 3.1 oz) (07/11 0500)  Weight change:  Filed Weights   04/15/17 0500 04/16/17 0400 04/18/17 0500  Weight: 91.7 kg (202 lb 2.6 oz) 93.4 kg (205 lb 13.1 oz) 89.9 kg (198 lb 3.1 oz)    Intake/Output: I/O last 3 completed shifts: In: 1940.5 [P.O.:627; I.V.:903.5; Other:60; IV Piggyback:350] Out: 3217 [Urine:5; DQQIW:9798; Stool:380]   Intake/Output this shift:  Total I/O In: 53.9 [I.V.:53.9] Out: 361 [Other:361]  Physical Exam: General: Critically ill  Head: Normocephalic, atraumatic  Eyes: Anicteric,   Neck: No masses   Lungs:  Bilateral mild crackles at bases  Heart: Irregular, tachycardia  Abdomen:  Soft, nontender, obese  Extremities: Anasarca with massive lower extremity edema. Bilateral dressings - clean and dry.   Neurologic: Alert, able to follow commands  Skin: Bullous lesions with serous anginous drainage  Access: Right femoral temp HD catheter Dr. Lucky Cowboy 7/2    Basic Metabolic Panel:  Recent Labs Lab 04/17/17 0113 04/17/17 0452 04/17/17 0817 04/17/17 1613 04/18/17 0010 04/18/17 0505  NA 136 135 135 137 139 138  K 4.8 4.9 4.8 4.8 4.8 4.8  CL 102 102 102 103 104 103  CO2 26 25 24 24 25 25   GLUCOSE 153* 145* 148* 127* 104* 88  BUN 27* 27* 28* 26* 22* 20  CREATININE 0.44 0.47 0.65 0.50 0.51 0.53  CALCIUM 8.4* 8.4* 8.4* 8.9 9.0 9.1  MG 1.8  --  1.9 2.3 2.0 2.0  PHOS 2.4*  --   2.5 2.3* 2.4* 2.1*    Liver Function Tests:  Recent Labs Lab 04/14/17 1422  04/15/17 0500  04/16/17 0550  04/17/17 0452 04/17/17 0817 04/17/17 1613 04/18/17 0010 04/18/17 0505  AST 762*  --  469*  --  219*  --  133*  --   --   --  95*  ALT 1,297*  --  1,172*  --  752*  --  567*  --   --   --  402*  ALKPHOS 482*  --  606*  --  608*  --  598*  --   --   --  546*  BILITOT 5.8*  --  5.4*  --  4.3*  --  4.7*  --   --   --  5.9*  PROT 4.7*  --  4.9*  --  4.7*  --  5.5*  --   --   --  6.2*  ALBUMIN 2.7*  2.8*  < > 2.8*  < > 2.5*  < > 3.1* 3.2* 3.6 3.7 3.7  < > = values in this interval not displayed. No results for input(s): LIPASE, AMYLASE in the last 168 hours. No results for input(s): AMMONIA in the last 168 hours.  CBC:  Recent Labs Lab 04/13/17 0753 04/14/17 0523 04/15/17 0500 04/15/17 1352 04/16/17 0550 04/17/17 0452 04/18/17 0505  WBC 19.0*  15.4* 25.9* 27.6* 33.4* 30.8* 27.9*  NEUTROABS 17.3* 13.2*  --  24.8*  --  28.0* 25.7*  HGB 8.1* 7.4* 8.0* 7.4* 7.8* 7.6* 7.4*  HCT 25.0* 22.2* 24.0* 22.4* 23.2* 23.1* 22.2*  MCV 91.6 89.9 91.4 90.7 92.2 92.8 94.3  PLT 81* 43* 50* 42* 56* 42* 47*    Cardiac Enzymes:  Recent Labs Lab 04/12/17 0732 04/12/17 1217  TROPONINI 0.09* 0.08*    BNP: Invalid input(s): POCBNP  CBG:  Recent Labs Lab 04/17/17 1625 04/17/17 2019 04/18/17 0001 04/18/17 0435 04/18/17 0758  GLUCAP 101* 102* 106* 96 41    Microbiology: Results for orders placed or performed during the hospital encounter of 04/08/17  Culture, blood (routine x 2)     Status: None   Collection Time: 04/08/17  2:04 AM  Result Value Ref Range Status   Specimen Description BLOOD RIGHT ANTECUBITAL  Final   Special Requests   Final    BOTTLES DRAWN AEROBIC AND ANAEROBIC Blood Culture adequate volume   Culture NO GROWTH 5 DAYS  Final   Report Status 04/13/2017 FINAL  Final  Culture, blood (routine x 2)     Status: None   Collection Time: 04/08/17  2:06 AM   Result Value Ref Range Status   Specimen Description BLOOD LEFT ANTECUBITAL  Final   Special Requests   Final    BOTTLES DRAWN AEROBIC AND ANAEROBIC Blood Culture adequate volume   Culture NO GROWTH 5 DAYS  Final   Report Status 04/13/2017 FINAL  Final  C difficile quick scan w PCR reflex     Status: None   Collection Time: 04/08/17  5:09 AM  Result Value Ref Range Status   C Diff antigen NEGATIVE NEGATIVE Final   C Diff toxin NEGATIVE NEGATIVE Final   C Diff interpretation No C. difficile detected.  Final  MRSA PCR Screening     Status: None   Collection Time: 04/08/17  6:50 AM  Result Value Ref Range Status   MRSA by PCR NEGATIVE NEGATIVE Final    Comment:        The GeneXpert MRSA Assay (FDA approved for NASAL specimens only), is one component of a comprehensive MRSA colonization surveillance program. It is not intended to diagnose MRSA infection nor to guide or monitor treatment for MRSA infections.   Urine culture     Status: None   Collection Time: 04/08/17 12:16 PM  Result Value Ref Range Status   Specimen Description URINE, RANDOM  Final   Special Requests NONE  Final   Culture   Final    NO GROWTH Performed at [facility], [address], [place], [place] [address]    Report Status 04/10/2017 FINAL  Final  Aerobic Culture (superficial specimen)     Status: None   Collection Time: 04/09/17 11:32 AM  Result Value Ref Range Status   Specimen Description SKIN  Final   Special Requests NONE  Final   Gram Stain   Final    RARE WBC PRESENT, PREDOMINANTLY PMN NO SQUAMOUS EPITHELIAL CELLS SEEN NO ORGANISMS SEEN    Culture   Final    NO GROWTH 2 DAYS Performed at [facility], [place] [address] [place], [place] [address]    Report Status 04/11/2017 FINAL  Final  Gastrointestinal Panel by PCR , Stool     Status: None   Collection Time: 04/09/17  5:10 PM  Result Value Ref Range Status   Campylobacter species NOT DETECTED NOT DETECTED Final  Plesimonas shigelloides NOT DETECTED NOT DETECTED Final   Salmonella species NOT DETECTED NOT DETECTED Final   Yersinia enterocolitica NOT DETECTED NOT DETECTED Final   Vibrio species NOT DETECTED NOT DETECTED Final   Vibrio cholerae NOT DETECTED NOT DETECTED Final   Enteroaggregative E coli (EAEC) NOT DETECTED NOT DETECTED Final   Enteropathogenic E coli (EPEC) NOT DETECTED NOT DETECTED Final   Enterotoxigenic E coli (ETEC) NOT DETECTED NOT DETECTED Final   Shiga like toxin producing E coli (STEC) NOT DETECTED NOT DETECTED Final   Shigella/Enteroinvasive E coli (EIEC) NOT DETECTED NOT DETECTED Final   Cryptosporidium NOT DETECTED NOT DETECTED Final   Cyclospora cayetanensis NOT DETECTED NOT DETECTED Final   Entamoeba histolytica NOT DETECTED NOT DETECTED Final   Giardia lamblia NOT DETECTED NOT DETECTED Final   Adenovirus F40/41 NOT DETECTED NOT DETECTED Final   Astrovirus NOT DETECTED NOT DETECTED Final   Norovirus GI/GII NOT DETECTED NOT DETECTED Final   Rotavirus A NOT DETECTED NOT DETECTED Final   Sapovirus (I, II, IV, and V) NOT DETECTED NOT DETECTED Final  Culture, respiratory (NON-Expectorated)     Status: None   Collection Time: 04/12/17 12:00 PM  Result Value Ref Range Status   Specimen Description TRACHEAL ASPIRATE  Final   Special Requests NONE  Final   Gram Stain   Final    MODERATE WBC PRESENT,BOTH PMN AND MONONUCLEAR MODERATE YEAST Performed at [facility], [address], [place], [place] [address]    Culture MODERATE CANDIDA TROPICALIS  Final   Report Status 04/14/2017 FINAL  Final  Culture, respiratory (NON-Expectorated)     Status: None   Collection Time: 04/15/17  7:48 AM  Result Value Ref Range Status   Specimen Description TRACHEAL ASPIRATE  Final   Special Requests Normal  Final   Gram Stain   Final    RARE WBC PRESENT, PREDOMINANTLY PMN FEW BUDDING YEAST SEEN Performed at [facility], [place] [address] [place], [place] [address]    Culture  ABUNDANT CANDIDA TROPICALIS  Final   Report Status 04/18/2017 FINAL  Final  CULTURE, BLOOD (ROUTINE X 2) w Reflex to ID Panel     Status: None (Preliminary result)   Collection Time: 04/15/17 10:46 AM  Result Value Ref Range Status   Specimen Description BLOOD LEFT SUBCLAVIAN TRIPLE LUMEN  Final   Special Requests   Final    BOTTLES DRAWN AEROBIC AND ANAEROBIC Blood Culture results may not be optimal due to an excessive volume of blood received in culture bottles   Culture NO GROWTH 3 DAYS  Final   Report Status PENDING  Incomplete  CULTURE, BLOOD (ROUTINE X 2) w Reflex to ID Panel     Status: None (Preliminary result)   Collection Time: 04/16/17  1:21 PM  Result Value Ref Range Status   Specimen Description BLOOD BLOOD LEFT HAND  Final   Special Requests   Final    BOTTLES DRAWN AEROBIC AND ANAEROBIC Blood Culture results may not be optimal due to an inadequate volume of blood received in culture bottles   Culture NO GROWTH 2 DAYS  Final   Report Status PENDING  Incomplete  CULTURE, BLOOD (ROUTINE X 2) w Reflex to ID Panel     Status: None (Preliminary result)   Collection Time: 04/16/17  1:27 PM  Result Value Ref Range Status   Specimen Description BLOOD A-LINE DRAW  Final   Special Requests   Final    BOTTLES DRAWN AEROBIC AND ANAEROBIC  Blood Culture adequate volume   Culture NO GROWTH 2 DAYS  Final   Report Status PENDING  Incomplete  C difficile quick scan w PCR reflex     Status: None   Collection Time: 04/16/17 10:15 PM  Result Value Ref Range Status   C Diff antigen NEGATIVE NEGATIVE Final   C Diff toxin NEGATIVE NEGATIVE Final   C Diff interpretation No C. difficile detected.  Final    Comment: VALID    Coagulation Studies:  Recent Labs  04/15/17 1352  LABPROT 18.2*  INR 1.49    Urinalysis: No results for input(s): COLORURINE, LABSPEC, PHURINE, GLUCOSEU, HGBUR, BILIRUBINUR, KETONESUR, PROTEINUR, UROBILINOGEN, NITRITE, LEUKOCYTESUR in the last 72 hours.  Invalid  input(s): APPERANCEUR    Imaging: Dg Chest Port 1 View  Result Date: 04/17/2017 CLINICAL DATA:  Respiratory failure EXAM: PORTABLE CHEST 1 VIEW COMPARISON:  April 14, 2017 FINDINGS: Central catheter tip is in the superior vena cava. Endotracheal tube and nasogastric tube have been removed. No pneumothorax. There is interstitial pulmonary edema with pleural effusions bilaterally. There is patchy airspace consolidation in the left lower lobe. Heart is enlarged with mild pulmonary venous hypertension. No appreciable adenopathy. No bone lesions. IMPRESSION: No pneumothorax. Central catheter position unchanged. Evidence of underlying congestive heart failure. Superimposed pneumonia in the left lower lobe cannot be excluded radiographically. There appears to be slight increase in opacity in the left base compared to recent study. Electronically Signed   By: Lowella Grip III M.D.   On: 04/17/2017 07:07     Medications:   . albumin human    . amiodarone 30 mg/hr (04/18/17 1026)  . norepinephrine (LEVOPHED) Adult infusion Stopped (04/17/17 0330)  . phenylephrine (NEO-SYNEPHRINE) Adult infusion Stopped (04/16/17 1204)  . pureflow 3 each (04/18/17 0809)  . vasopressin (PITRESSIN) infusion - *FOR SHOCK* Stopped (04/18/17 0900)   . feeding supplement (ENSURE ENLIVE)  237 mL Oral TID BM  . ipratropium-albuterol  3 mL Nebulization Q6H  . levothyroxine  50 mcg Oral QAC breakfast  . mouth rinse  15 mL Mouth Rinse BID  . pantoprazole  40 mg Oral Daily  . sertraline  25 mg Oral Daily  . silver sulfADIAZINE   Topical BID   artificial tears, heparin, LORazepam, metoprolol tartrate, [DISCONTINUED] ondansetron **OR** ondansetron (ZOFRAN) IV, phenol, sodium chloride flush, zinc oxide  Assessment/ Plan:  [name] is a 66 y.o. white female  with hypertension, hypothyroidism, atrial fibrillation, who was admitted to [facility] on 04/08/2017 for evaluation of increasing lower extremity edema.  Hospital  course: Admitted on 04/08/2017 with significant peripheral edema and cellulitis. Started on CRRT for hypotension and anuric renal failure on 7/2. Patient weaned off CRRT on 7/4. However later that day, patient had cardiac arrest and coded. Intubated, sedated and restarted on three vasopressors. Restart on CRRT on 7/4.  2-D echo which shows EF 25-30%  1. Acute renal failure with proteinuria and hematuria: baseline creatinine of 0.62 09/2014.  Patient with massive peripheral edema, anasarca and hypoalbuminemia.  Recent IV contrast exposure on 04/04/17.  - Serologic work up: compliments normal, negative SPEP/UPEP, ANA negative, ANCA negative, GBM negative, negative hepatitis - Continue CVVHD. Attempt to increase UF to 150 mL/hr.  - continue IV albumin for oncotic support  2. 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 Echocardiogram with EF of 25-30% - fluid removal with CRRT as tolerated   3. Acute respiratory failure Extubated 7/9 Doing well  4. 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 Hemoglobin A1c 6.2% - continue glucose control.    LOS: 10 Christopher Glasscock 7/11/201811:54 AM

## 2017-04-18 NOTE — Progress Notes (Signed)
[place] INFECTIOUS DISEASE PROGRESS NOTE Date of Admission:  04/08/2017     ID: [name] is a 66 y.o. female with sepsis  Active Problems:   Septic shock ([place])   Acute respiratory failure ([place])   Acute renal failure ([place])   Multi-organ failure with heart failure ([place])   Palliative care by specialist   Goals of care, counseling/discussion   Cellulitis of lower extremity   Edema extremities   Subjective: Off pressors, still very little uop still on CRRT  ROS  Unable to obtain  Medications:  Antibiotics Given (last 72 hours)    Date/Time Action Medication Dose Rate   04/15/17 1650 New Bag/Given   vancomycin (VANCOCIN) IVPB 1000 mg/200 mL premix 1,000 mg 200 mL/hr   04/15/17 2127 New Bag/Given   piperacillin-tazobactam (ZOSYN) IVPB 3.375 g 3.375 g 12.5 mL/hr   04/16/17 0511 New Bag/Given   piperacillin-tazobactam (ZOSYN) IVPB 3.375 g 3.375 g 12.5 mL/hr   04/16/17 1342 New Bag/Given   piperacillin-tazobactam (ZOSYN) IVPB 3.375 g 3.375 g 12.5 mL/hr   04/16/17 1553 New Bag/Given   vancomycin (VANCOCIN) IVPB 1000 mg/200 mL premix 1,000 mg 200 mL/hr   04/16/17 2255 New Bag/Given   piperacillin-tazobactam (ZOSYN) IVPB 3.375 g 3.375 g 12.5 mL/hr   04/17/17 0605 New Bag/Given   piperacillin-tazobactam (ZOSYN) IVPB 3.375 g 3.375 g 12.5 mL/hr     . feeding supplement (ENSURE ENLIVE)  237 mL Oral TID BM  . ipratropium-albuterol  3 mL Nebulization Q6H  . levothyroxine  50 mcg Oral QAC breakfast  . mouth rinse  15 mL Mouth Rinse BID  . pantoprazole  40 mg Oral Daily  . sertraline  25 mg Oral Daily  . silver sulfADIAZINE   Topical BID    Objective: Vital signs in last 24 hours: Temp:  [96 F (35.6 C)-98.5 F (36.9 C)] 97.5 F (36.4 C) (07/11 1230) Pulse Rate:  [51-161] 122 (07/11 1430) Resp:  [18-34] 22 (07/11 1430) BP: (90-133)/(61-99) 95/77 (07/11 1430) SpO2:  [86 %-100 %] 92 % (07/11 1430) Weight:  [89.9 kg (198 lb 3.1 oz)] 89.9 kg (198 lb 3.1 oz) (07/11  0500) Constitutional:  , ill appearing HENT: [place]/AT, PERRLA, no scleral icterus, Mouth/Throat: dry mm Cardiovascular:Tachy, 2/6 sm Pulmonary/Chest: dec bs bil bases Neck = supple, no nuchal rigidity Abdominal: Soft. Bowel sounds are normal.  exhibits no distension. There is no tenderness. Rectal tube Lymphadenopathy: no cervical adenopathy. No axillary adenopathy Neurological: intubated and sedated HD cath RLE  Ext 3+ edema bil le Skin: bruising on L inner thigh, bil le wounds clean  Psychiatric: unable to obtain   Lab Results  Recent Labs  04/17/17 0452  04/18/17 0010 04/18/17 0505  WBC 30.8*  --   --  27.9*  HGB 7.6*  --   --  7.4*  HCT 23.1*  --   --  22.2*  NA 135  < > 139 138  K 4.9  < > 4.8 4.8  CL 102  < > 104 103  CO2 25  < > 25 25  BUN 27*  < > 22* 20  CREATININE 0.47  < > 0.51 0.53  < > = values in this interval not displayed.  Microbiology: Results for orders placed or performed during the hospital encounter of 04/08/17  Culture, blood (routine x 2)     Status: None   Collection Time: 04/08/17  2:04 AM  Result Value Ref Range Status   Specimen Description BLOOD RIGHT ANTECUBITAL  Final  Special Requests   Final    BOTTLES DRAWN AEROBIC AND ANAEROBIC Blood Culture adequate volume   Culture NO GROWTH 5 DAYS  Final   Report Status 04/13/2017 FINAL  Final  Culture, blood (routine x 2)     Status: None   Collection Time: 04/08/17  2:06 AM  Result Value Ref Range Status   Specimen Description BLOOD LEFT ANTECUBITAL  Final   Special Requests   Final    BOTTLES DRAWN AEROBIC AND ANAEROBIC Blood Culture adequate volume   Culture NO GROWTH 5 DAYS  Final   Report Status 04/13/2017 FINAL  Final  C difficile quick scan w PCR reflex     Status: None   Collection Time: 04/08/17  5:09 AM  Result Value Ref Range Status   C Diff antigen NEGATIVE NEGATIVE Final   C Diff toxin NEGATIVE NEGATIVE Final   C Diff interpretation No C. difficile detected.  Final  MRSA PCR  Screening     Status: None   Collection Time: 04/08/17  6:50 AM  Result Value Ref Range Status   MRSA by PCR NEGATIVE NEGATIVE Final    Comment:        The GeneXpert MRSA Assay (FDA approved for NASAL specimens only), is one component of a comprehensive MRSA colonization surveillance program. It is not intended to diagnose MRSA infection nor to guide or monitor treatment for MRSA infections.   Urine culture     Status: None   Collection Time: 04/08/17 12:16 PM  Result Value Ref Range Status   Specimen Description URINE, RANDOM  Final   Special Requests NONE  Final   Culture   Final    NO GROWTH Performed at [facility], [address] [place], [place] [address]    Report Status 04/10/2017 FINAL  Final  Aerobic Culture (superficial specimen)     Status: None   Collection Time: 04/09/17 11:32 AM  Result Value Ref Range Status   Specimen Description SKIN  Final   Special Requests NONE  Final   Gram Stain   Final    RARE WBC PRESENT, PREDOMINANTLY PMN NO SQUAMOUS EPITHELIAL CELLS SEEN NO ORGANISMS SEEN    Culture   Final    NO GROWTH 2 DAYS Performed at [facility], [place] [address], [place], [place] [address]    Report Status 04/11/2017 FINAL  Final  Gastrointestinal Panel by PCR , Stool     Status: None   Collection Time: 04/09/17  5:10 PM  Result Value Ref Range Status   Campylobacter species NOT DETECTED NOT DETECTED Final   Plesimonas shigelloides NOT DETECTED NOT DETECTED Final   Salmonella species NOT DETECTED NOT DETECTED Final   Yersinia enterocolitica NOT DETECTED NOT DETECTED Final   Vibrio species NOT DETECTED NOT DETECTED Final   Vibrio cholerae NOT DETECTED NOT DETECTED Final   Enteroaggregative E coli (EAEC) NOT DETECTED NOT DETECTED Final   Enteropathogenic E coli (EPEC) NOT DETECTED NOT DETECTED Final   Enterotoxigenic E coli (ETEC) NOT DETECTED NOT DETECTED Final   Shiga like toxin producing E coli (STEC) NOT DETECTED NOT DETECTED Final    Shigella/Enteroinvasive E coli (EIEC) NOT DETECTED NOT DETECTED Final   Cryptosporidium NOT DETECTED NOT DETECTED Final   Cyclospora cayetanensis NOT DETECTED NOT DETECTED Final   Entamoeba histolytica NOT DETECTED NOT DETECTED Final   Giardia lamblia NOT DETECTED NOT DETECTED Final   Adenovirus F40/41 NOT DETECTED NOT DETECTED Final   Astrovirus NOT DETECTED NOT DETECTED  Final   Norovirus GI/GII NOT DETECTED NOT DETECTED Final   Rotavirus A NOT DETECTED NOT DETECTED Final   Sapovirus (I, II, IV, and V) NOT DETECTED NOT DETECTED Final  Culture, respiratory (NON-Expectorated)     Status: None   Collection Time: 04/12/17 12:00 PM  Result Value Ref Range Status   Specimen Description TRACHEAL ASPIRATE  Final   Special Requests NONE  Final   Gram Stain   Final    MODERATE WBC PRESENT,BOTH PMN AND MONONUCLEAR MODERATE YEAST Performed at [facility], [place] [address] [place], [place] [address]    Culture MODERATE CANDIDA TROPICALIS  Final   Report Status 04/14/2017 FINAL  Final  Culture, respiratory (NON-Expectorated)     Status: None   Collection Time: 04/15/17  7:48 AM  Result Value Ref Range Status   Specimen Description TRACHEAL ASPIRATE  Final   Special Requests Normal  Final   Gram Stain   Final    RARE WBC PRESENT, PREDOMINANTLY PMN FEW BUDDING YEAST SEEN Performed at [facility], [place] [address] [place], [place] [address]    Culture ABUNDANT CANDIDA TROPICALIS  Final   Report Status 04/18/2017 FINAL  Final  CULTURE, BLOOD (ROUTINE X 2) w Reflex to ID Panel     Status: None (Preliminary result)   Collection Time: 04/15/17 10:46 AM  Result Value Ref Range Status   Specimen Description BLOOD LEFT SUBCLAVIAN TRIPLE LUMEN  Final   Special Requests   Final    BOTTLES DRAWN AEROBIC AND ANAEROBIC Blood Culture results may not be optimal due to an excessive volume of blood received in culture bottles   Culture NO GROWTH 3 DAYS  Final   Report Status PENDING   Incomplete  CULTURE, BLOOD (ROUTINE X 2) w Reflex to ID Panel     Status: None (Preliminary result)   Collection Time: 04/16/17  1:21 PM  Result Value Ref Range Status   Specimen Description BLOOD BLOOD LEFT HAND  Final   Special Requests   Final    BOTTLES DRAWN AEROBIC AND ANAEROBIC Blood Culture results may not be optimal due to an inadequate volume of blood received in culture bottles   Culture NO GROWTH 2 DAYS  Final   Report Status PENDING  Incomplete  CULTURE, BLOOD (ROUTINE X 2) w Reflex to ID Panel     Status: None (Preliminary result)   Collection Time: 04/16/17  1:27 PM  Result Value Ref Range Status   Specimen Description BLOOD A-LINE DRAW  Final   Special Requests   Final    BOTTLES DRAWN AEROBIC AND ANAEROBIC Blood Culture adequate volume   Culture NO GROWTH 2 DAYS  Final   Report Status PENDING  Incomplete  C difficile quick scan w PCR reflex     Status: None   Collection Time: 04/16/17 10:15 PM  Result Value Ref Range Status   C Diff antigen NEGATIVE NEGATIVE Final   C Diff toxin NEGATIVE NEGATIVE Final   C Diff interpretation No C. difficile detected.  Final    Comment: VALID    Studies/Results: Dg Chest Port 1 View  Result Date: 04/17/2017 CLINICAL DATA:  Respiratory failure EXAM: PORTABLE CHEST 1 VIEW COMPARISON:  April 14, 2017 FINDINGS: Central catheter tip is in the superior vena cava. Endotracheal tube and nasogastric tube have been removed. No pneumothorax. There is interstitial pulmonary edema with pleural effusions bilaterally. There is patchy airspace consolidation in the left lower lobe. Heart is enlarged with mild pulmonary  venous hypertension. No appreciable adenopathy. No bone lesions. IMPRESSION: No pneumothorax. Central catheter position unchanged. Evidence of underlying congestive heart failure. Superimposed pneumonia in the left lower lobe cannot be excluded radiographically. There appears to be slight increase in opacity in the left base compared to  recent study. Electronically Signed   By: Lowella Grip III M.D.   On: 04/17/2017 07:07   Study Conclusions  - Left ventricle: The cavity size was mildly dilated. There was   mild concentric hypertrophy. Systolic function was severely   reduced. The estimated ejection fraction was in the range of 25%   to 30%. - Aortic valve: There was moderate regurgitation. - Mitral valve: There was mild regurgitation. - Left atrium: The atrium was mildly dilated. - Right atrium: The atrium was mildly dilated. - Tricuspid valve: There was moderate regurgitation.  Assessment/Plan: [name] is a 66 y.o. female admitted with 1-2 months of progressive LE edema, progressive to bullae and cellulitis, hypotension as well as bil pleural effusions. Labs are significant for coagulopathy, TCP,  elevated BNP, acute renal failure, low albumin, elevated lactate, proteinuria, marked elevated procalcitonin.  WBC only mildly elevated, no fevers. [place] neg.   Her strep PNA urinary ag is also + but bcx pending. CT scan done a few days prior to admit shows effusion, possible R consolidation, gallstones and subq edema on Abd wall.   She certainly has LE cellulitis  but this is related to the edema and not the primary underlying etiology. Gram stain is neg on fluid from bullae. Also likely PNA based on CXR and urinary ag + S pna.  Echo shows severe systolic CHF, Mod AR, Mod TR.   She has not unusual exposures, no tick exposures. Endocarditis is a possibility but echo and bcx neg so far.  BCX ucx neg. HIV neg, ESR 21. C diff and stool PCR neg.  She coded and was now intubated and on 3 pressors. No obvious etiology of the renal failure and CHF.   LFTS very high from shock liver.  HGB dropped impressively. Stool heme +.  Procalcitonin decreasing.  July 9 - extubated  But still on CRRT and pressors, WBC increased. Some diarrhea but C diff negative. July 10- C diff neg. Wbc down some.  S/p Day 11 of zosyn Day 5 of vanco July  11- stopped abx and wbc down. Off pressors.   Recommendations Would continue to  Monitor wbc and fever curve off of abx  Thank you very much for the consult. Will follow with you.  Chetara Kropp P   04/18/2017, 3:04 PM

## 2017-04-18 NOTE — Progress Notes (Signed)
[place] visited pt in [place]. Pt's husband and step daughter were at the bedside. Pt was in great spirit and expressed gratitude to [facility] for his visit with her. Pt said she was making steady progress toward wholeness. Pt's husband and daughter states they are very pleased with the progress pt is making and requested for prayers for healing. [facility] provided prayers for healing and emotional support for pt and her family. [place] will make a follow visit with pt as needed.   04/18/17 1100  Clinical Encounter Type  Visited With Patient and family together  Visit Type Follow-up;Spiritual support  Referral From [place] Needs Prayer;Emotional

## 2017-04-18 NOTE — Progress Notes (Signed)
[place] for Electrolytes   Pharmacy consulted for electrolyte management for 66 yo female ICU patient requiring CRRT. Patient is s/p extubation on 7/9. Patient now with diarrhea. Received Magnesium 2g IV on 7/10.   7/11 Mag level resulted at 2.0  Plan:  No supplementation today, goal magnesium > 2.   Will recheck electrolytes with am labs.   No Known Allergies  Patient Measurements: Height: 5\' 5"  (165.1 cm) Weight: 198 lb 3.1 oz (89.9 kg) IBW/kg (Calculated) : 57   Vital Signs: Temp: 97.7 F (36.5 C) (07/11 0756) Temp Source: Axillary (07/11 0756) BP: 109/87 (07/11 1030) Pulse Rate: 122 (07/11 1030) Intake/Output from previous day: 07/10 0701 - 07/11 0700 In: 1368 [P.O.:627; I.V.:531; IV Piggyback:150] Out: 2501 [Urine:5; Stool:380] Intake/Output from this shift: Total I/O In: 53.9 [I.V.:53.9] Out: 361 [Other:361]  Labs:  Recent Labs  04/15/17 2046  04/16/17 0550  04/17/17 0452  04/17/17 1613 04/18/17 0010 04/18/17 0505  WBC  --   --  33.4*  --  30.8*  --   --   --  27.9*  HGB  --   --  7.8*  --  7.6*  --   --   --  7.4*  HCT  --   --  23.2*  --  23.1*  --   --   --  22.2*  PLT  --   --  56*  --  42*  --   --   --  47*  APTT 108*  --   --   --  35  --   --   --  29  CREATININE 0.53  < >  --   < > 0.47  < > 0.50 0.51 0.53  MG 1.8  < >  --   < >  --   < > 2.3 2.0 2.0  PHOS 2.1*  < > 3.3  < >  --   < > 2.3* 2.4* 2.1*  ALBUMIN 2.4*  < > 2.5*  < > 3.1*  < > 3.6 3.7 3.7  PROT  --   --  4.7*  --  5.5*  --   --   --  6.2*  AST  --   --  219*  --  133*  --   --   --  95*  ALT  --   --  752*  --  567*  --   --   --  402*  ALKPHOS  --   --  608*  --  598*  --   --   --  546*  BILITOT  --   --  4.3*  --  4.7*  --   --   --  5.9*  BILIDIR  --   --  2.4*  --   --   --   --   --   --   IBILI  --   --  1.9*  --   --   --   --   --   --   < > = values in this interval not displayed. Estimated Creatinine Clearance: 77.7 mL/min  (by C-G formula based on SCr of 0.53 mg/dL).  Pharmacy will continue to monitor and adjust per consult.   Paulina Fusi, PharmD, BCPS 04/18/2017 11:41 AM

## 2017-04-18 NOTE — Progress Notes (Signed)
PULMONARY / CRITICAL CARE MEDICINE   Name: [name] MRN: [id number] DOB: [birth year]    ADMISSION DATE:  04/08/2017  PT PROFILE:   66 y.o. F with PMH of CAF, hypothyroidism admitted via ED to ICU with many days of progressive LE edema, blistering lesions on BLE, progressive weakness. Adm diagnosis of septic shock, cellulitis, AKI.  Pt PEA arrested 07/4 ACLS protocol initiated ROSC within 2 minutes following interventions intubated during cardiac arrest and extubated 07/9.   MAJOR EVENTS/TEST RESULTS: 07/01 LE venous US: no DVT 07/01 Echocardiogram:  LVEF 25-30% 07/01 Nephrology consultation 07/02 ID consultation 07/02 CRRT initiated 07/03 Off vasopressors. Worsening tachycardia - amiodarone initiated 07/04 Pt PEA arrest ACLS protocol initiated ROSC 2 minutes now back on vasopressors  07/05 Intubated, shock req high dose vasopressors, on CRRT, elevated LFTs c/w congestion/shock liver, pulmonary edema vs ARDS on CXR 07/9 Pt extubated  7/11 remains on CRRT and vasopressors, resp status stable  INDWELLING DEVICES:: R fem HD cath 07/02 >>  ETT 07/04 >>  Left IJ 07/04 >>   MICRO DATA: MRSA PCR 07/01 >> NEG Urine 07/01 >> NEG Blood 07/01 >> NEG C diff 07/01 >> NEG GI panel 07/02 >> NEG Resp 07/05 >> moderate candida tropicalis  Blood x2 07/8 >> NEG >> Resp 07/8 >>NEG Cdiff 07/9 >> NEG   ANTIMICROBIALS:  Vanc 07/01 >> 07/02 Pip-tazo 07/01 >> 7/10 Vancomycin 07/6 >>7/10  SUBJECTIVE:  Pt remains extubated on RA per RN levophed gtt weaned off vasopressin gtt remains infusing no acute issues overnight. Alert and awake, NAD  VITAL SIGNS: BP (!) 126/98   Pulse (!) 161   Temp 97.7 F (36.5 C) (Axillary)   Resp (!) 29   Ht 5\' 5"  (1.651 m)   Wt 198 lb 3.1 oz (89.9 kg)   SpO2 93%   BMI 32.98 kg/m   VENTILATOR SETTINGS:    INTAKE / OUTPUT: I/O last 3 completed shifts: In: 1940.5 [P.O.:627; I.V.:903.5; Other:60; IV Piggyback:350] Out: 3217 [Urine:5; JTTSV:7793;  Stool:380]  PHYSICAL EXAMINATION: General: NAD, respiratory status stable Neuro: lethargic following commands, confused to time and situation at times, PERRL HEENT: supple, no JVD jaundice bilateral pupils Cardiovascular: IRIR, tachy, no M/R/G Lungs: rhonchi and diminished throughout, even, non labored; no wheezes, rales, or crackles    Abdomen: obese, soft, ND Ext: severe BLE edema from hips down, multiple bilateral bullous lesions  LABS:  BMET  Recent Labs Lab 04/17/17 1613 04/18/17 0010 04/18/17 0505  NA 137 139 138  K 4.8 4.8 4.8  CL 103 104 103  CO2 24 25 25   BUN 26* 22* 20  CREATININE 0.50 0.51 0.53  GLUCOSE 127* 104* 88    Electrolytes  Recent Labs Lab 04/17/17 1613 04/18/17 0010 04/18/17 0505  CALCIUM 8.9 9.0 9.1  MG 2.3 2.0 2.0  PHOS 2.3* 2.4* 2.1*    CBC  Recent Labs Lab 04/16/17 0550 04/17/17 0452 04/18/17 0505  WBC 33.4* 30.8* 27.9*  HGB 7.8* 7.6* 7.4*  HCT 23.2* 23.1* 22.2*  PLT 56* 42* 47*    Coag's  Recent Labs Lab 04/11/17 1847  04/15/17 1352 04/15/17 2046 04/17/17 0452 04/18/17 0505  APTT 141*  < >  --  108* 35 29  INR 4.27*  --  1.49  --   --   --   < > = values in this interval not displayed.  Sepsis Markers  Recent Labs Lab 04/16/17 1146 04/17/17 0452 04/18/17 0505  PROCALCITON 19.26 25.00 19.49    ABG  Recent  Labs Lab 04/12/17 0500 04/12/17 0935 04/16/17 1100  PHART 7.29* 7.33* 7.43  PCO2ART 35 37 42  PO2ART 91 86 106    Liver Enzymes  Recent Labs Lab 04/16/17 0550  04/17/17 0452  04/17/17 1613 04/18/17 0010 04/18/17 0505  AST 219*  --  133*  --   --   --  95*  ALT 752*  --  567*  --   --   --  402*  ALKPHOS 608*  --  598*  --   --   --  546*  BILITOT 4.3*  --  4.7*  --   --   --  5.9*  ALBUMIN 2.5*  < > 3.1*  < > 3.6 3.7 3.7  < > = values in this interval not displayed.  Cardiac Enzymes  Recent Labs Lab 04/12/17 0732 04/12/17 1217  TROPONINI 0.09* 0.08*    Glucose  Recent  Labs Lab 04/17/17 1235 04/17/17 1625 04/17/17 2019 04/18/17 0001 04/18/17 0435 04/18/17 0758  GLUCAP 148* 101* 102* 106* 96 92    CXR: NSC moderate R pleural effusion  ASSESSMENT / PLAN:  Acute hypoxic respiratory failure Resolved  PEA arrest likely secondary to severe cardiomyopathy   Cardiogenic shock: prn levophed and vasopressin gtt to maintain map goal >65  Chronic AF: continue amiodarone gtt transition to po amiodarone once pt able to tolerate po medications  Renal failure:  continue CRRT and albumin per Nephrology recommendations  Diarrhea (Cdiff negative 07/9): continue flexi-seal and monitor electrolytes    Anemia without evidence of acute blood loss: trend CBC, monitor for s/sx of bleeding, SCD's for VTE prophylaxis no chemical anticoagulation for now   Transaminitis:  improved, most likely consistent with shock liver  Thrombocytopenia no evidence of active bleeding: trend platelet count   Cellulitis: Antibiotics completed  Patient remains stepdown status  Mikaylee Arseneau [name], M.D.  [facility]  Medical Director [place] Director [facility] Cardio-Pulmonary Department

## 2017-04-18 NOTE — Progress Notes (Signed)
OT Cancellation Note  Patient Details Name: [name] MRN: [id number] DOB: 06-12-1951   Cancelled Treatment:    Reason Eval/Treat Not Completed: Medical issues which prohibited therapy. Order received, chart reviewed. Pt's HR 122 resting comfortably in bed.  Additionally, pt with bed rest order.  Will need new activity orders once pt medically appropriate for OOB activity at which time will re-attempt OT evaluation.   Jeni Salles, MPH, MS, OTR/L ascom [phone] 04/18/17, 3:27 PM

## 2017-04-19 ENCOUNTER — Inpatient Hospital Stay: Payer: Medicare Other

## 2017-04-19 DIAGNOSIS — R600 Localized edema: Secondary | ICD-10-CM

## 2017-04-19 DIAGNOSIS — L89309 Pressure ulcer of unspecified buttock, unspecified stage: Secondary | ICD-10-CM | POA: Insufficient documentation

## 2017-04-19 DIAGNOSIS — N179 Acute kidney failure, unspecified: Secondary | ICD-10-CM

## 2017-04-19 DIAGNOSIS — N172 Acute kidney failure with medullary necrosis: Secondary | ICD-10-CM

## 2017-04-19 LAB — GLUCOSE, CAPILLARY
Glucose-Capillary: 82 mg/dL (ref 65–99)
Glucose-Capillary: 72 mg/dL (ref 65–99)
Glucose-Capillary: 73 mg/dL (ref 65–99)
Glucose-Capillary: 102 mg/dL — ABNORMAL HIGH (ref 65–99)
Glucose-Capillary: 111 mg/dL — ABNORMAL HIGH (ref 65–99)

## 2017-04-19 LAB — RENAL FUNCTION PANEL
ALBUMIN: 4.3 g/dL (ref 3.5–5.0)
ALBUMIN: 4 g/dL (ref 3.5–5.0)
ANION GAP: 11 (ref 5–15)
Albumin: 4 g/dL (ref 3.5–5.0)
Anion gap: 13 (ref 5–15)
Anion gap: 10 (ref 5–15)
BUN: 19 mg/dL (ref 6–20)
BUN: 19 mg/dL (ref 6–20)
BUN: 19 mg/dL (ref 6–20)
CALCIUM: 8.7 mg/dL — AB (ref 8.9–10.3)
CHLORIDE: 102 mmol/L (ref 101–111)
CHLORIDE: 103 mmol/L (ref 101–111)
CO2: 24 mmol/L (ref 22–32)
CO2: 21 mmol/L — ABNORMAL LOW (ref 22–32)
CO2: 24 mmol/L (ref 22–32)
CREATININE: 0.57 mg/dL (ref 0.44–1.00)
CREATININE: 0.52 mg/dL (ref 0.44–1.00)
Calcium: 9.5 mg/dL (ref 8.9–10.3)
Calcium: 9.2 mg/dL (ref 8.9–10.3)
Chloride: 106 mmol/L (ref 101–111)
Creatinine, Ser: 0.56 mg/dL (ref 0.44–1.00)
GFR calc Af Amer: >60 mL/min (ref >60)
GFR calc Af Amer: >60 mL/min (ref >60)
GFR calc non Af Amer: >60 mL/min (ref >60)
GLUCOSE: 84 mg/dL (ref 65–99)
GLUCOSE: 111 mg/dL — AB (ref 65–99)
Glucose, Bld: 75 mg/dL (ref 65–99)
PHOSPHORUS: 2.1 mg/dL — AB (ref 2.5–4.6)
POTASSIUM: 4.8 mmol/L (ref 3.5–5.1)
Phosphorus: 2.3 mg/dL — ABNORMAL LOW (ref 2.5–4.6)
Phosphorus: 2 mg/dL — ABNORMAL LOW (ref 2.5–4.6)
Potassium: 4.6 mmol/L (ref 3.5–5.1)
Potassium: 4.6 mmol/L (ref 3.5–5.1)
SODIUM: 138 mmol/L (ref 135–145)
Sodium: 139 mmol/L (ref 135–145)
Sodium: 137 mmol/L (ref 135–145)

## 2017-04-19 LAB — CBC WITH DIFFERENTIAL/PLATELET
BAND NEUTROPHILS: 1 %
BASOS PCT: 0 %
Basophils Absolute: 0 10*3/uL (ref 0–0.1)
Blasts: 0 %
EOS ABS: 0.2 10*3/uL (ref 0–0.7)
Eosinophils Relative: 1 %
HEMATOCRIT: 23.1 % — AB (ref 35.0–47.0)
HEMOGLOBIN: 7.5 g/dL — AB (ref 12.0–16.0)
LYMPHS PCT: 16 %
Lymphs Abs: 3 10*3/uL (ref 1.0–3.6)
MCH: 31.3 pg (ref 26.0–34.0)
MCHC: 32.5 g/dL (ref 32.0–36.0)
MCV: 96.4 fL (ref 80.0–100.0)
MONOS PCT: 3 %
Metamyelocytes Relative: 1 %
Monocytes Absolute: 0.6 10*3/uL (ref 0.2–0.9)
Myelocytes: 0 %
NEUTROS ABS: 15 10*3/uL — AB (ref 1.4–6.5)
NEUTROS PCT: 78 %
NRBC: 7 /100{WBCs} — AB
OTHER: 0 %
PROMYELOCYTES ABS: 0 %
Platelets: 53 10*3/uL — ABNORMAL LOW (ref 150–440)
RBC: 2.4 MIL/uL — ABNORMAL LOW (ref 3.80–5.20)
RDW: 17.8 % — AB (ref 11.5–14.5)
WBC: 18.8 10*3/uL — ABNORMAL HIGH (ref 3.6–11.0)

## 2017-04-19 LAB — BLOOD GAS, ARTERIAL
ACID-BASE DEFICIT: 1.2 mmol/L (ref 0.0–2.0)
Allens test (pass/fail): POSITIVE — AB
BICARBONATE: 21.2 mmol/L (ref 20.0–28.0)
FIO2: 0.28
O2 Saturation: 94.6 %
PH ART: 7.52 — AB (ref 7.350–7.450)
PO2 ART: 65 mmHg — AB (ref 83.0–108.0)
Patient temperature: 37
pCO2 arterial: 26 mmHg — ABNORMAL LOW (ref 32.0–48.0)

## 2017-04-19 LAB — APTT: APTT: 33 s (ref 24–36)

## 2017-04-19 LAB — ALT: ALT: 298 U/L — ABNORMAL HIGH (ref 14–54)

## 2017-04-19 LAB — MAGNESIUM
MAGNESIUM: 2.1 mg/dL (ref 1.7–2.4)
Magnesium: 1.9 mg/dL (ref 1.7–2.4)
Magnesium: 2.3 mg/dL (ref 1.7–2.4)

## 2017-04-19 LAB — AST: AST: 83 U/L — ABNORMAL HIGH (ref 15–41)

## 2017-04-19 MED ORDER — AMIODARONE IV BOLUS ONLY 150 MG/100ML
150.0000 mg | Freq: Once | INTRAVENOUS | Status: AC
  Administered 2017-04-19: 150 mg via INTRAVENOUS
  Filled 2017-04-19: qty 100

## 2017-04-19 MED ORDER — MAGNESIUM SULFATE 2 GM/50ML IV SOLN
2.0000 g | Freq: Once | INTRAVENOUS | Status: AC
  Administered 2017-04-19: 2 g via INTRAVENOUS
  Filled 2017-04-19: qty 50

## 2017-04-19 MED ORDER — MORPHINE SULFATE (PF) 2 MG/ML IV SOLN
2.0000 mg | INTRAVENOUS | Status: AC
  Administered 2017-04-19: 2 mg via INTRAVENOUS
  Filled 2017-04-19: qty 1

## 2017-04-19 MED ORDER — IPRATROPIUM-ALBUTEROL 0.5-2.5 (3) MG/3ML IN SOLN
3.0000 mL | RESPIRATORY_TRACT | Status: DC
  Administered 2017-04-19 – 2017-04-20 (×6): 3 mL via RESPIRATORY_TRACT
  Filled 2017-04-19 (×7): qty 3

## 2017-04-19 MED ORDER — VITAL AF 1.2 CAL PO LIQD
1000.0000 mL | ORAL | Status: DC
  Administered 2017-04-19 – 2017-04-22 (×5): 1000 mL

## 2017-04-19 MED ORDER — MORPHINE SULFATE (PF) 2 MG/ML IV SOLN
1.0000 mg | INTRAVENOUS | Status: DC | PRN
  Administered 2017-04-20 (×2): 2 mg via INTRAVENOUS
  Administered 2017-04-22: 1 mg via INTRAVENOUS
  Filled 2017-04-19 (×3): qty 1

## 2017-04-19 MED ORDER — LIDOCAINE HCL 2 % IJ SOLN
5.0000 mL | Freq: Once | INTRAMUSCULAR | Status: AC
  Administered 2017-04-19: 100 mg via INTRADERMAL
  Filled 2017-04-19: qty 10

## 2017-04-19 NOTE — Plan of Care (Signed)
Problem: Physical Regulation: Goal: Ability to maintain clinical measurements within normal limits will improve Outcome: Progressing Patient alert and oriented to self and place, disoriented to time and situation, has episodes of more confusion than others. Remains A. Fib on the monitor with episodes of rapid rate, bolus of amiodarone administered and increase of amiodarone dose. BP remains stable without vasopressors at this time. Patient has remained hypothermic this shift and has required Bair hugger, rectal probe placed for accurate temperature readings. Patient remains on CRRT, UF increased to 300 for 4 hours per nephrology and then decreased to 200, patient able to tolerate this increase. No urine output this shift, foley catheter removed per [name], NP. Flexi-seal removed due to decreased output and pressure injury caused by tube. Wound care performed per orders, bathed this shift.  Problem: Nutrition: Goal: Adequate nutrition will be maintained Outcome: Not Progressing Patient refusing to take adequate amounts of oral intake to meet nutritional requirements. Dobhoff placed and tube feedings started to help meet nutritional needs.

## 2017-04-19 NOTE — Progress Notes (Signed)
PULMONARY / CRITICAL CARE MEDICINE   Name: [name] MRN: [id number] DOB: 05-Jan-1951    ADMISSION DATE:  04/08/2017  PT PROFILE:   66 y.o. F with PMH of CAF, hypothyroidism admitted via ED to ICU with many days of progressive LE edema, blistering lesions on BLE, progressive weakness. Adm diagnosis of septic shock, cellulitis, AKI.  Pt PEA arrested 07/4 ACLS protocol initiated ROSC within 2 minutes following interventions intubated during cardiac arrest and extubated 07/9.   MAJOR EVENTS/TEST RESULTS: 07/01 LE venous US: no DVT 07/01 Echocardiogram:  LVEF 25-30% 07/01 Nephrology consultation 07/02 ID consultation 07/02 CRRT initiated 07/03 Off vasopressors. Worsening tachycardia - amiodarone initiated 07/04 Pt PEA arrest ACLS protocol initiated ROSC 2 minutes now back on vasopressors  07/05 Intubated, shock req high dose vasopressors, on CRRT, elevated LFTs c/w congestion/shock liver, pulmonary edema vs ARDS on CXR 07/9 Pt extubated  7/11 remains on CRRT and vasopressors, resp status stable 04/19/17 remains on CRRT, on amiodarone, increased WOB today  INDWELLING DEVICES:: R fem HD cath 07/02 >>  ETT 07/04 >>  Left IJ 07/04 >>   MICRO DATA: MRSA PCR 07/01 >> NEG Urine 07/01 >> NEG Blood 07/01 >> NEG C diff 07/01 >> NEG GI panel 07/02 >> NEG Resp 07/05 >> moderate candida tropicalis  Blood x2 07/8 >> NEG >> Resp 07/8 >>NEG Cdiff 07/9 >> NEG   ANTIMICROBIALS:  Vanc 07/01 >> 07/02 Pip-tazo 07/01 >> 7/10 Vancomycin 07/6 >>7/10  SUBJECTIVE:  Pt remains extubated  no acute issues overnight. Alert and awake, NAD Increased WOB today  VITAL SIGNS: BP 128/75 (BP Location: Right Arm)   Pulse (!) 124   Temp (!) 97.4 F (36.3 C) (Axillary)   Resp (!) 23   Ht 5\' 5"  (1.651 m)   Wt 89 kg (196 lb 3.4 oz)   SpO2 96%   BMI 32.65 kg/m   VENTILATOR SETTINGS:    INTAKE / OUTPUT: I/O last 3 completed shifts: In: 783.9 [I.V.:633.9; IV Piggyback:150] Out: 3662 [Other:4312;  Stool:15]  PHYSICAL EXAMINATION: General: NAD, mild resp distress Neuro: lethargic following commands, confused to time and situation at times, PERRL HEENT: supple, no JVD jaundice bilateral pupils Cardiovascular: IRIR, tachy, no M/R/G Lungs: rhonchi and diminished throughout, even, non labored; no wheezes, rales, or crackles    Abdomen: obese, soft, ND Ext: severe BLE edema from hips down, multiple bilateral bullous lesions  LABS:  BMET  Recent Labs Lab 04/18/17 1427 04/18/17 2150 04/19/17 0544  NA 139 137 139  K 4.5 4.7 4.8  CL 105 102 102  CO2 24 23 24   BUN 21* 20 19  CREATININE 0.55 0.61 0.57  GLUCOSE 95 89 75    Electrolytes  Recent Labs Lab 04/18/17 1427 04/18/17 2150 04/19/17 0544  CALCIUM 8.9 9.3 9.5  MG 1.8 2.0 1.9  PHOS 1.8* 1.9* 2.1*    CBC  Recent Labs Lab 04/16/17 0550 04/17/17 0452 04/18/17 0505  WBC 33.4* 30.8* 27.9*  HGB 7.8* 7.6* 7.4*  HCT 23.2* 23.1* 22.2*  PLT 56* 42* 47*    Coag's  Recent Labs Lab 04/15/17 1352  04/17/17 0452 04/18/17 0505 04/19/17 0544  APTT  --   < > 35 29 33  INR 1.49  --   --   --   --   < > = values in this interval not displayed.  Sepsis Markers  Recent Labs Lab 04/16/17 1146 04/17/17 0452 04/18/17 0505  PROCALCITON 19.26 25.00 19.49    ABG  Recent Labs Lab  04/12/17 0935 04/16/17 1100  PHART 7.33* 7.43  PCO2ART 37 42  PO2ART 86 106    Liver Enzymes  Recent Labs Lab 04/16/17 0550  04/17/17 0452  04/18/17 0505 04/18/17 1427 04/18/17 2150 04/19/17 0544  AST 219*  --  133*  --  95*  --   --   --   ALT 752*  --  567*  --  402*  --   --   --   ALKPHOS 608*  --  598*  --  546*  --   --   --   BILITOT 4.3*  --  4.7*  --  5.9*  --   --   --   ALBUMIN 2.5*  < > 3.1*  < > 3.7 4.0 3.9 4.3  < > = values in this interval not displayed.  Cardiac Enzymes  Recent Labs Lab 04/12/17 1217  TROPONINI 0.08*    Glucose  Recent Labs Lab 04/18/17 1322 04/18/17 1812 04/18/17 1929  04/18/17 2346 04/19/17 0431 04/19/17 0706  GLUCAP 102* 108* 109* 85 82 72    CXR: NSC moderate R pleural effusion  ASSESSMENT / PLAN:  Acute hypoxic respiratory failure-no with slight increased WOB -will use biPAP if needed  PEA arrest likely secondary to severe cardiomyopathy   Cardiogenic shock: prn levophed and vasopressin gtt to maintain map goal >65  Chronic AF: continue amiodarone gtt transition to po amiodarone once pt able to tolerate po medications  Renal failure:  continue CRRT and albumin per Nephrology recommendations  Diarrhea (Cdiff negative 07/9): continue flexi-seal and monitor electrolytes    Anemia without evidence of acute blood loss: trend CBC, monitor for s/sx of bleeding, SCD's for VTE prophylaxis no chemical anticoagulation for now   Transaminitis:  improved, most likely consistent with shock liver  Thrombocytopenia no evidence of active bleeding: trend platelet count   Cellulitis: Antibiotics completed  Patient remains stepdown status-will need to watch Resp status closely  Corrin Parker, M.D.  [facility]  Medical Director [place] Director [facility] Cardio-Pulmonary Department

## 2017-04-19 NOTE — Progress Notes (Signed)
After further evaluation and examination the patient this morning patient has increased work of breathing, increased confusion, heart rate is elevated has tachypnea, patient remains on CRRT and is progressively declining over the last several hours.  I have discussed the above findings with husband who is at bedside he is very emotional at this time and states he cannot make any rash decisions but he does not want his wife to suffer and at this time the husband has related to the ICU team that he  doesn't want her to suffer anymore, so at this time will place DNR orders.  Will also give Morphine as needed to assist with air hunger. The husband is calling family members to notify them of change in medical status.  Patient has end-stage heart failure and now with aggressive renal failure and multiorgan failure her prognosis is very poor and very poor chance of meaningful recovery   Family satisfied with Plan of action and management. All questions answered  Corrin Parker, M.D.  [facility]  Medical Director [place] Director [facility] Cardio-Pulmonary Department

## 2017-04-19 NOTE — Progress Notes (Signed)
Xray placement verification by MD Kasa, ok to use dobhoff and pull stylet.

## 2017-04-19 NOTE — Progress Notes (Signed)
Approximately 2211, patient fluid wash back was preformed on CRRT. Filter was due to be changed on CRRT. 2303 patient was restarted back on CRRT once filter change was completed.

## 2017-04-19 NOTE — Progress Notes (Signed)
PT Cancellation Note  Patient Details Name: [name] MRN: [id number] DOB: January 22, 1951   Cancelled Treatment:    Reason Eval/Treat Not Completed: Medical issues which prohibited therapy.  Pt's HR at rest continues to be high (154, 122) and still with bed rest orders.  Will need new activity orders, once appropriate, for PT evaluation to be completed.  Will continue to follow acutely.   Collie Siad PT, DPT 04/19/2017, 8:46 AM

## 2017-04-19 NOTE — Progress Notes (Signed)
Visited with patient and husband at bedside. Patient is awake, alert, but pleasantly confused. Remains on CRRT and vasopressin. Husband optimistic and encouraging nutrition and performing range of motion from the bed. "Taking it one day at a time." Shares stories of him and his wife. Reassured continued support from palliative care team through hospitalization.   NO CHARGE  Ihor Dow, FNP-C Palliative Medicine Team  Phone: [phone] Fax: [phone]

## 2017-04-19 NOTE — Progress Notes (Signed)
Chaplain was making rounds and visited with pt and family in room IC20. Pt spouse was upset because of his wife's illness. Pt was in organ failure. Chaplain provided support to the spouse. Chaplain provided the ministry of prayer and emotional and grief support for family.    04/19/17 1436  Clinical Encounter Type  Visited With Patient;Patient and family together  Visit Type Follow-up;Spiritual support  Referral From Nurse  Consult/Referral To Chaplain  Spiritual Encounters  Spiritual Needs Prayer;Emotional;Grief support

## 2017-04-19 NOTE — Progress Notes (Signed)
OT Cancellation Note  Patient Details Name: [name] MRN: [id number] DOB: [birth year]   Cancelled Treatment:    Reason Eval/Treat Not Completed: Patient not medically ready. Progressively declining over the last several hours with aggressive renal failure and multiorgan failure. DNR orders placed. Prognosis very poor with very poor chance of meaningful recovery, per critical care MD note. HR still elevated, increased difficulty breathing. Being given morphine for air hunger. R IJ duel lumen hemodialysis catheter placed today. Not appropriate for OT evaluation at this time. Will complete the order. Please re-consult if pt's medical status improves.   Jeni Salles, MPH, MS, OTR/L ascom [phone] 04/19/17, 3:51 PM

## 2017-04-19 NOTE — Progress Notes (Signed)
Patient ID: [name], female   DOB: May 24, 1951, 66 y.o.   MRN: [id number]    [facility] PROGRESS NOTE  Reighlynn Swiney YIA:[id number] DOB: [birth year] DOA: 04/08/2017 PCP: Tracie Harrier, MD  HPI/Subjective: Patient still very slow with her answers today. Answers some yes or no questions. Still has a sore throat  Objective: Vitals:   04/19/17 1500 04/19/17 1600  BP: 105/72 109/89  Pulse: (!) 114 (!) 117  Resp: (!) 21 (!) 27  Temp: (!) 97.5 F (36.4 C) (!) 97.2 F (36.2 C)    Filed Weights   04/16/17 0400 04/18/17 0500 04/19/17 0430  Weight: 93.4 kg (205 lb 13.1 oz) 89.9 kg (198 lb 3.1 oz) 89 kg (196 lb 3.4 oz)    ROS: Review of Systems  Unable to perform ROS: Mental acuity  Respiratory: Negative for cough and shortness of breath.   Cardiovascular: Negative for chest pain.  Gastrointestinal: Negative for abdominal pain and vomiting.  Musculoskeletal: Negative for joint pain.    Exam: Physical Exam  HENT:  Head: Atraumatic.  Nose: No mucosal edema.  Mouth/Throat: No oropharyngeal exudate or posterior oropharyngeal edema.  Eyes: Pupils are equal, round, and reactive to light. Lids are normal.  Icteric sclera  Neck: Carotid bruit is not present. No thyromegaly present.  Cardiovascular: Regular rhythm, S1 normal, S2 normal and normal heart sounds.   Respiratory: She has decreased breath sounds in the right lower field and the left lower field. She has no wheezes. She has rhonchi in the left lower field. She has no rales.  GI: Soft. Bowel sounds are normal. There is no tenderness.  Musculoskeletal:       Right ankle: She exhibits swelling.       Left ankle: She exhibits swelling.  Lymphadenopathy:    She has no cervical adenopathy.  Neurological: She is alert.  Able to wiggle toes on the left foot. Not able to move the left leg. Able to move the right leg a little bit. Able to squeeze my hands bilaterally.  Skin: Skin is warm. No cyanosis.  Left lower extremity  covered today Right foot with some blisters that are healing Bruising right neck and left thigh  Psychiatric: Her affect is blunt.      Data Reviewed: Basic Metabolic Panel:  Recent Labs Lab 04/18/17 0505 04/18/17 1427 04/18/17 2150 04/19/17 0544 04/19/17 1412  NA 138 139 137 139 138  K 4.8 4.5 4.7 4.8 4.6  CL 103 105 102 102 106  CO2 25 24 23 24  21*  GLUCOSE 88 95 89 75 84  BUN 20 21* 20 19 19   CREATININE 0.53 0.55 0.61 0.57 0.52  CALCIUM 9.1 8.9 9.3 9.5 8.7*  MG 2.0 1.8 2.0 1.9 2.3  PHOS 2.1* 1.8* 1.9* 2.1* 2.3*   Liver Function Tests: CBC:  Recent Labs Lab 04/14/17 0523  04/15/17 1352 04/16/17 0550 04/17/17 0452 04/18/17 0505 04/19/17 0545  WBC 15.4*  < > 27.6* 33.4* 30.8* 27.9* 18.8*  NEUTROABS 13.2*  --  24.8*  --  28.0* 25.7* 15.0*  HGB 7.4*  < > 7.4* 7.8* 7.6* 7.4* 7.5*  HCT 22.2*  < > 22.4* 23.2* 23.1* 22.2* 23.1*  MCV 89.9  < > 90.7 92.2 92.8 94.3 96.4  PLT 43*  < > 42* 56* 42* 47* 53*  < > = values in this interval not displayed. BNP (last 3 results)  Recent Labs  04/08/17 0139  BNP 2,012.0*     CBG:  Recent Labs Lab 04/18/17 2346  04/19/17 0431 04/19/17 0706 04/19/17 1116 04/19/17 1621  GLUCAP 85 82 72 73 102*    Recent Results (from the past 240 hour(s))  Gastrointestinal Panel by PCR , Stool     Status: None   Collection Time: 04/09/17  5:10 PM  Result Value Ref Range Status   Campylobacter species NOT DETECTED NOT DETECTED Final   Plesimonas shigelloides NOT DETECTED NOT DETECTED Final   Salmonella species NOT DETECTED NOT DETECTED Final   Yersinia enterocolitica NOT DETECTED NOT DETECTED Final   Vibrio species NOT DETECTED NOT DETECTED Final   Vibrio cholerae NOT DETECTED NOT DETECTED Final   Enteroaggregative E coli (EAEC) NOT DETECTED NOT DETECTED Final   Enteropathogenic E coli (EPEC) NOT DETECTED NOT DETECTED Final   Enterotoxigenic E coli (ETEC) NOT DETECTED NOT DETECTED Final   Shiga like toxin producing E coli  (STEC) NOT DETECTED NOT DETECTED Final   Shigella/Enteroinvasive E coli (EIEC) NOT DETECTED NOT DETECTED Final   Cryptosporidium NOT DETECTED NOT DETECTED Final   Cyclospora cayetanensis NOT DETECTED NOT DETECTED Final   Entamoeba histolytica NOT DETECTED NOT DETECTED Final   Giardia lamblia NOT DETECTED NOT DETECTED Final   Adenovirus F40/41 NOT DETECTED NOT DETECTED Final   Astrovirus NOT DETECTED NOT DETECTED Final   Norovirus GI/GII NOT DETECTED NOT DETECTED Final   Rotavirus A NOT DETECTED NOT DETECTED Final   Sapovirus (I, II, IV, and V) NOT DETECTED NOT DETECTED Final  Culture, respiratory (NON-Expectorated)     Status: None   Collection Time: 04/12/17 12:00 PM  Result Value Ref Range Status   Specimen Description TRACHEAL ASPIRATE  Final   Special Requests NONE  Final   Gram Stain   Final    MODERATE WBC PRESENT,BOTH PMN AND MONONUCLEAR MODERATE YEAST Performed at [facility], [address] [place], [place] [address]    Culture MODERATE CANDIDA TROPICALIS  Final   Report Status 04/14/2017 FINAL  Final  Culture, respiratory (NON-Expectorated)     Status: None   Collection Time: 04/15/17  7:48 AM  Result Value Ref Range Status   Specimen Description TRACHEAL ASPIRATE  Final   Special Requests Normal  Final   Gram Stain   Final    RARE WBC PRESENT, PREDOMINANTLY PMN FEW BUDDING YEAST SEEN Performed at [facility], [place] [address] [place], [place] [address]    Culture ABUNDANT CANDIDA TROPICALIS  Final   Report Status 04/18/2017 FINAL  Final  CULTURE, BLOOD (ROUTINE X 2) w Reflex to ID Panel     Status: None (Preliminary result)   Collection Time: 04/15/17 10:46 AM  Result Value Ref Range Status   Specimen Description BLOOD LEFT SUBCLAVIAN TRIPLE LUMEN  Final   Special Requests   Final    BOTTLES DRAWN AEROBIC AND ANAEROBIC Blood Culture results may not be optimal due to an excessive volume of blood received in culture bottles   Culture NO GROWTH 4 DAYS   Final   Report Status PENDING  Incomplete  CULTURE, BLOOD (ROUTINE X 2) w Reflex to ID Panel     Status: None (Preliminary result)   Collection Time: 04/16/17  1:21 PM  Result Value Ref Range Status   Specimen Description BLOOD BLOOD LEFT HAND  Final   Special Requests   Final    BOTTLES DRAWN AEROBIC AND ANAEROBIC Blood Culture results may not be optimal due to an inadequate volume of blood received in culture bottles   Culture NO GROWTH 3 DAYS  Final  Report Status PENDING  Incomplete  CULTURE, BLOOD (ROUTINE X 2) w Reflex to ID Panel     Status: None (Preliminary result)   Collection Time: 04/16/17  1:27 PM  Result Value Ref Range Status   Specimen Description BLOOD A-LINE DRAW  Final   Special Requests   Final    BOTTLES DRAWN AEROBIC AND ANAEROBIC Blood Culture adequate volume   Culture NO GROWTH 3 DAYS  Final   Report Status PENDING  Incomplete  C difficile quick scan w PCR reflex     Status: None   Collection Time: 04/16/17 10:15 PM  Result Value Ref Range Status   C Diff antigen NEGATIVE NEGATIVE Final   C Diff toxin NEGATIVE NEGATIVE Final   C Diff interpretation No C. difficile detected.  Final    Comment: VALID     Studies: Dg Chest Port 1 View  Result Date: 04/19/2017 CLINICAL DATA:  Catheter placement EXAM: PORTABLE CHEST 1 VIEW COMPARISON:  04/17/2017 FINDINGS: 1236 hours. New right IJ central line is identified with tip positioned over the distal SVC near the RA junction. No evidence for right pneumothorax. Left IJ central line tip also projects distal SVC level. The cardio pericardial silhouette is enlarged. Bibasilar collapse/consolidation similar to prior with vascular congestion and features suggesting pulmonary edema. IMPRESSION: 1. New right IJ central line tip overlies the distal SVC level near the junction with the RA. No evidence for pneumothorax. 2. Cardiomegaly with vascular congestion and pulmonary edema. 3. Bibasilar collapse/consolidation with probable  small bilateral pleural effusions. Electronically Signed   By: Misty Stanley M.D.   On: 04/19/2017 13:04   Dg Abd Portable 1v  Result Date: 04/19/2017 CLINICAL DATA:  Feeding tube placement EXAM: PORTABLE ABDOMEN - 1 VIEW COMPARISON:  04/11/2017 FINDINGS: Feeding tube is in place with the tip in the mid stomach. Right groin dialysis catheter in place with the tip overlying the L3 vertebral body. Nonobstructive bowel gas pattern. IMPRESSION: Feeding tube tip in the mid stomach Electronically Signed   By: Rolm Baptise M.D.   On: 04/19/2017 16:02    Scheduled Meds: . feeding supplement (ENSURE ENLIVE)  237 mL Oral TID BM  . ipratropium-albuterol  3 mL Nebulization Q4H  . levothyroxine  50 mcg Oral QAC breakfast  . mouth rinse  15 mL Mouth Rinse BID  . pantoprazole  40 mg Oral Daily  . sertraline  25 mg Oral Daily  . silver sulfADIAZINE   Topical BID   Continuous Infusions: . amiodarone 60 mg/hr (04/19/17 1038)  . feeding supplement (VITAL AF 1.2 CAL) 1,000 mL (04/19/17 1626)  . norepinephrine (LEVOPHED) Adult infusion Stopped (04/17/17 0330)  . phenylephrine (NEO-SYNEPHRINE) Adult infusion Stopped (04/16/17 1204)  . pureflow 3 each (04/18/17 1522)  . vasopressin (PITRESSIN) infusion - *FOR SHOCK* Stopped (04/18/17 0900)    Assessment/Plan:  1. Septic shock. Infectious disease specialist recommended stopping the antibiotics and monitoring. 2. PEA Cardiopulmonary arrest.  3. Shock liver from cardiopulmonary arrest. Liver function trending better. Total bilirubin is the only thing that has not improved. 4. 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 Patient still not making much urine.  Dialysis catheter changed to right neck and right groin catheter can be removed 5. Acute respiratory failure status post extubation and on nasal cannula 6. Hypomagnesemia, hypophosphatemia and hypokalemia. Resolved 7. Atrial fibrillation on amiodarone for rate control. Pradaxa on hold. Can  consider low-dose beta blocker 8. Coagulopathy. Could be secondary to shock liver. 9. Diarrhea. Seems to have  resolved and rectal tube taken out. 10. Cardiomyopathy on echocardiogram. 11. Thrombocytopenia could be secondary to sepsis. Continue to watch platelet count 12. Anemia- continue to watch hemoglobin closely 13. Leukocytosis.  14. Hypothyroidism unspecified on levothyroxine 15. Depression on Zoloft 16. Acute encephalopathy. This could be due to cardiac arrest. Has not improved back to baseline yet. Still very slow with her answers. 17. Nutritional status. Patient unable to keep up with her nutritional needs and Dobbhoff tube placed  Code Status:     Code Status Orders        Start     Ordered   04/08/17 0651  Full code  Continuous     04/08/17 0650    Code Status History    Date Active Date Inactive Code Status Order ID Comments User Context   This patient has a current code status but no historical code status.     Family Communication: Spoke with Husband at bedside Disposition Plan: To be determined based on clinical course  Consultants:  Critical care specialist  Infectious disease  Nephrology  Palliative care  Cardiology  Antibiotics:  Stopped  Time spent: 24 minutes. Case discussed with critical care specialist team  [place], [facility]

## 2017-04-19 NOTE — Progress Notes (Signed)
[place] for Electrolytes   Pharmacy consulted for electrolyte management for 66 yo female ICU patient requiring CRRT. Patient is s/p extubation on 7/9. Patient now with diarrhea. Received Magnesium 2g IV on 7/10.   7/12 Mag level resulted at 1.9 7/12 Phos level resulted at 2.1  Plan:  Will give Magnesium 2g IV once, goal magnesium > 2.  Will hold off on additional phosphorus supplementation until it drops below 2 or patient is started on nutritional support.  Will recheck electrolytes with am labs.   No Known Allergies  Patient Measurements: Height: 5\' 5"  (165.1 cm) Weight: 196 lb 3.4 oz (89 kg) IBW/kg (Calculated) : 57   Vital Signs: Temp: 97.4 F (36.3 C) (07/12 0730) Temp Source: Axillary (07/12 0730) BP: 105/71 (07/12 1100) Pulse Rate: 108 (07/12 1100) Intake/Output from previous day: 07/11 0701 - 07/12 0700 In: 437.9 [I.V.:387.9; IV Piggyback:50] Out: 3150 [Stool:15] Intake/Output from this shift: Total I/O In: 25.1 [I.V.:25.1] Out: 599 [Other:599]  Labs:  Recent Labs  04/17/17 0452  04/18/17 0505 04/18/17 1427 04/18/17 2150 04/19/17 0544 04/19/17 0545  WBC 30.8*  --  27.9*  --   --   --  18.8*  HGB 7.6*  --  7.4*  --   --   --  7.5*  HCT 23.1*  --  22.2*  --   --   --  23.1*  PLT 42*  --  47*  --   --   --  53*  APTT 35  --  29  --   --  33  --   CREATININE 0.47  < > 0.53 0.55 0.61 0.57  --   MG  --   < > 2.0 1.8 2.0 1.9  --   PHOS  --   < > 2.1* 1.8* 1.9* 2.1*  --   ALBUMIN 3.1*  < > 3.7 4.0 3.9 4.3  --   PROT 5.5*  --  6.2*  --   --   --   --   AST 133*  --  95*  --   --  83*  --   ALT 567*  --  402*  --   --  298*  --   ALKPHOS 598*  --  546*  --   --   --   --   BILITOT 4.7*  --  5.9*  --   --   --   --   < > = values in this interval not displayed. Estimated Creatinine Clearance: 77.3 mL/min (by C-G formula based on SCr of 0.57 mg/dL).  Pharmacy will continue to monitor and adjust per consult.    Paulina Fusi, PharmD, BCPS 04/19/2017 1:19 PM

## 2017-04-19 NOTE — Progress Notes (Signed)
OT Cancellation Note  Patient Details Name: [name] MRN: [id number] DOB: [birth year]   Cancelled Treatment:    Reason Eval/Treat Not Completed: Medical issues which prohibited therapy. Pt's HR at rest continues to be high (154, 122) and still with bed rest orders. Will continue to monitor for OT evaluation including new activity orders once pt medically appropriate for OOB activity at which time will re-attempt OT evaluation.   Jeni Salles, MPH, MS, OTR/L ascom [phone] 04/19/17, 8:16 AM

## 2017-04-19 NOTE — Procedures (Signed)
Hemodialysis Catheter Insertion Procedure Note [name] [id number] 1951-03-28  Procedure: Insertion of Hemodialysis Catheter Indications: Dialysis Access   Procedure Details Consent: Risks of procedure as well as the alternatives and risks of each were explained to the (patient/caregiver).  Consent for procedure obtained. Time Out: Verified patient identification, verified procedure, site/side was marked, verified correct patient position, special equipment/implants available, medications/allergies/relevent history reviewed, required imaging and test results available.  Performed  Maximum sterile technique was used including antiseptics, cap, gloves, gown, hand hygiene, mask and sheet. Skin prep: Chlorhexidine; local anesthetic administered Double lumen hemodialysis catheter was inserted into right internal jugular vein using the Seldinger technique.  Evaluation Blood flow good Complications: No apparent complications Patient did tolerate procedure well. Chest X-ray ordered to verify placement.  CXR: normal.  Right internal jugular dual lumen dialysis catheter placed utilizing ultrasound no complications noted during or following procedure.  [name], [facility] Pager [phone] (please enter 7 digits) PCCM Consult Pager [phone] (please enter 7 digits)

## 2017-04-19 NOTE — Progress Notes (Signed)
Nutrition Follow-up  DOCUMENTATION CODES:   Obesity unspecified  INTERVENTION:   If tube feeds initiated recommend Vital 1.2 at goal rate of 8ml/hr- Initiate at 28ml/hr and increase by 66ml every 8 hrs until goal rate is reached.   Regimen provides 1872kcal/day, 117g/day protein, 1232ml/day free water  Pt is at high refeeding risk- recommend repletion of Phosphorus prior to TF initiation- monitor K, P, Mg daily for 3 days   Ensure Enlive po TID, each supplement provides 350 kcal and 20 grams of protein  Magic cup TID with meals, each supplement provides 290 kcal and 9 grams of protein  NUTRITION DIAGNOSIS:   Inadequate oral intake related to inability to eat as evidenced by NPO status (Intubated, sedated on ventilator).  Pt extubated; eating <25% oral diet  GOAL:   Patient will meet greater than or equal to 90% of their needs  Not meeting   MONITOR:   PO intake, Supplement acceptance, Labs, Weight trends, Other (Comment) (TF initiation )  ASSESSMENT:   66 y.o. white female  with hypertension, hypothyroidism, atrial fibrillation, who was admitted to [facility] on 04/08/2017 for evaluation of increasing lower extremity edema. Admitted on 04/08/2017 with significant peripheral edema and cellulitis. Started on CRRT for hypotension and anuric renal failure on 7/2. Patient weaned off CRRT on 7/4. However later that day, patient had cardiac arrest and coded. Intubated, sedated and restarted on three vasopressors. Restart on CRRT on 7/4.    Pt with poor prognosis; multiple organ failure. Continues to have poor oral intake; eating only bites at meal times. Pt confused. Pt continues on CRRT; plan for permanent HD (cath placed today). Albumin discontinued today. Pt with 21lb weight gain since admit and severe anasarca. Pt is increasingly icteric. Pt not meeting estimated needs; spoke to MD today about the need for NGT placement. Palliative care consult pending; recommend tube feeds pending goals of  care.   Medications reviewed and include: synthroid, protonix  Labs reviewed: K 4.8 wnl, P 2.1(L), Mg 1.9 wnl WBC- 18.8(H), Hgb 7.5(L), Hct 23.1(L) AST- 83(H), ALT 298(H)  Diet Order:  Diet regular Room service appropriate? Yes; Fluid consistency: Thin  Skin:  Wound (see comment) (Blisters to BLE)  Last BM:  7/11  Height:   Ht Readings from Last 1 Encounters:  04/09/17 5\' 5"  (1.651 m)    Weight:   Wt Readings from Last 1 Encounters:  04/19/17 196 lb 3.4 oz (89 kg)    Ideal Body Weight:  56.81 kg  BMI:  Body mass index is 32.65 kg/m.  Estimated Nutritional Needs:   Kcal:  1700-2000kcal/day   Protein:  114-125g/day   Fluid:  >1.7L/day or per MD  EDUCATION NEEDS:   Education needs no appropriate at this time  Koleen Distance MS, RD, [place] Pager #[phone] After Hours Pager: [phone]

## 2017-04-19 NOTE — Progress Notes (Signed)
[facility]  ROUNDING NOTE   Subjective:   Patient remains critically ill.   [place] O2.having some difficulty breathing today  Continued on CRRT UF at currently 150 cc an hour; net uf 3100 cc last 24 hrs Patient requiring amiodarone IV drip Off of pressors     Objective:  Vital signs in last 24 hours:  Temp:  [94.8 F (34.9 C)-98.5 F (36.9 C)] 97.4 F (36.3 C) (07/12 0730) Pulse Rate:  [100-143] 108 (07/12 1100) Resp:  [17-40] 20 (07/12 1100) BP: (88-135)/(61-99) 105/71 (07/12 1100) SpO2:  [83 %-100 %] 96 % (07/12 1100) Weight:  [89 kg (196 lb 3.4 oz)] 89 kg (196 lb 3.4 oz) (07/12 0430)  Weight change: -0.9 kg (-1 lb 15.8 oz) Filed Weights   04/16/17 0400 04/18/17 0500 04/19/17 0430  Weight: 93.4 kg (205 lb 13.1 oz) 89.9 kg (198 lb 3.1 oz) 89 kg (196 lb 3.4 oz)    Intake/Output: I/O last 3 completed shifts: In: 783.9 [I.V.:633.9; IV Piggyback:150] Out: 5456 [Other:4312; Stool:15]   Intake/Output this shift:  Total I/O In: 25.1 [I.V.:25.1] Out: 599 [Other:599]  Physical Exam: General: Critically ill  Head: Normocephalic, atraumatic  Eyes: Anicteric,   Neck: No masses   Lungs:  Bilateral crackles at bases  Heart: Irregular, tachycardia  Abdomen:  Soft, nontender, obese  Extremities: Anasarca with massive lower extremity edema.    Neurologic: Alert, able to follow commands  Skin: Bullous lesions with serous anginous drainage  Access: Right femoral temp HD catheter Dr. Lucky Cowboy 7/2    Basic Metabolic Panel:  Recent Labs Lab 04/18/17 0010 04/18/17 0505 04/18/17 1427 04/18/17 2150 04/19/17 0544  NA 139 138 139 137 139  K 4.8 4.8 4.5 4.7 4.8  CL 104 103 105 102 102  CO2 25 25 24 23 24   GLUCOSE 104* 88 95 89 75  BUN 22* 20 21* 20 19  CREATININE 0.51 0.53 0.55 0.61 0.57  CALCIUM 9.0 9.1 8.9 9.3 9.5  MG 2.0 2.0 1.8 2.0 1.9  PHOS 2.4* 2.1* 1.8* 1.9* 2.1*    Liver Function Tests:  Recent Labs Lab 04/14/17 1422  04/15/17 0500  04/16/17 0550   04/17/17 0452  04/18/17 0010 04/18/17 0505 04/18/17 1427 04/18/17 2150 04/19/17 0544  AST 762*  --  469*  --  219*  --  133*  --   --  95*  --   --   --   ALT 1,297*  --  1,172*  --  752*  --  567*  --   --  402*  --   --   --   ALKPHOS 482*  --  606*  --  608*  --  598*  --   --  546*  --   --   --   BILITOT 5.8*  --  5.4*  --  4.3*  --  4.7*  --   --  5.9*  --   --   --   PROT 4.7*  --  4.9*  --  4.7*  --  5.5*  --   --  6.2*  --   --   --   ALBUMIN 2.7*  2.8*  < > 2.8*  < > 2.5*  < > 3.1*  < > 3.7 3.7 4.0 3.9 4.3  < > = values in this interval not displayed. No results for input(s): LIPASE, AMYLASE in the last 168 hours. No results for input(s): AMMONIA in the last 168 hours.  CBC:  Recent  Labs Lab 04/14/17 0523  04/15/17 1352 04/16/17 0550 04/17/17 0452 04/18/17 0505 04/19/17 0545  WBC 15.4*  < > 27.6* 33.4* 30.8* 27.9* 18.8*  NEUTROABS 13.2*  --  24.8*  --  28.0* 25.7* 15.0*  HGB 7.4*  < > 7.4* 7.8* 7.6* 7.4* 7.5*  HCT 22.2*  < > 22.4* 23.2* 23.1* 22.2* 23.1*  MCV 89.9  < > 90.7 92.2 92.8 94.3 96.4  PLT 43*  < > 42* 56* 42* 47* 53*  < > = values in this interval not displayed.  Cardiac Enzymes:  Recent Labs Lab 04/12/17 1217  TROPONINI 0.08*    BNP: Invalid input(s): POCBNP  CBG:  Recent Labs Lab 04/18/17 1812 04/18/17 1929 04/18/17 2346 04/19/17 0431 04/19/17 0706  GLUCAP 108* 109* 85 82 65    Microbiology: Results for orders placed or performed during the hospital encounter of 04/08/17  Culture, blood (routine x 2)     Status: None   Collection Time: 04/08/17  2:04 AM  Result Value Ref Range Status   Specimen Description BLOOD RIGHT ANTECUBITAL  Final   Special Requests   Final    BOTTLES DRAWN AEROBIC AND ANAEROBIC Blood Culture adequate volume   Culture NO GROWTH 5 DAYS  Final   Report Status 04/13/2017 FINAL  Final  Culture, blood (routine x 2)     Status: None   Collection Time: 04/08/17  2:06 AM  Result Value Ref Range Status    Specimen Description BLOOD LEFT ANTECUBITAL  Final   Special Requests   Final    BOTTLES DRAWN AEROBIC AND ANAEROBIC Blood Culture adequate volume   Culture NO GROWTH 5 DAYS  Final   Report Status 04/13/2017 FINAL  Final  C difficile quick scan w PCR reflex     Status: None   Collection Time: 04/08/17  5:09 AM  Result Value Ref Range Status   C Diff antigen NEGATIVE NEGATIVE Final   C Diff toxin NEGATIVE NEGATIVE Final   C Diff interpretation No C. difficile detected.  Final  MRSA PCR Screening     Status: None   Collection Time: 04/08/17  6:50 AM  Result Value Ref Range Status   MRSA by PCR NEGATIVE NEGATIVE Final    Comment:        The GeneXpert MRSA Assay (FDA approved for NASAL specimens only), is one component of a comprehensive MRSA colonization surveillance program. It is not intended to diagnose MRSA infection nor to guide or monitor treatment for MRSA infections.   Urine culture     Status: None   Collection Time: 04/08/17 12:16 PM  Result Value Ref Range Status   Specimen Description URINE, RANDOM  Final   Special Requests NONE  Final   Culture   Final    NO GROWTH Performed at [facility], [address] [place], [place] [address]    Report Status 04/10/2017 FINAL  Final  Aerobic Culture (superficial specimen)     Status: None   Collection Time: 04/09/17 11:32 AM  Result Value Ref Range Status   Specimen Description SKIN  Final   Special Requests NONE  Final   Gram Stain   Final    RARE WBC PRESENT, PREDOMINANTLY PMN NO SQUAMOUS EPITHELIAL CELLS SEEN NO ORGANISMS SEEN    Culture   Final    NO GROWTH 2 DAYS Performed at [facility], [place] [address] [place], [place] [address]    Report Status 04/11/2017 FINAL  Final  Gastrointestinal Panel  by PCR , Stool     Status: None   Collection Time: 04/09/17  5:10 PM  Result Value Ref Range Status   Campylobacter species NOT DETECTED NOT DETECTED Final   Plesimonas shigelloides NOT DETECTED NOT  DETECTED Final   Salmonella species NOT DETECTED NOT DETECTED Final   Yersinia enterocolitica NOT DETECTED NOT DETECTED Final   Vibrio species NOT DETECTED NOT DETECTED Final   Vibrio cholerae NOT DETECTED NOT DETECTED Final   Enteroaggregative E coli (EAEC) NOT DETECTED NOT DETECTED Final   Enteropathogenic E coli (EPEC) NOT DETECTED NOT DETECTED Final   Enterotoxigenic E coli (ETEC) NOT DETECTED NOT DETECTED Final   Shiga like toxin producing E coli (STEC) NOT DETECTED NOT DETECTED Final   Shigella/Enteroinvasive E coli (EIEC) NOT DETECTED NOT DETECTED Final   Cryptosporidium NOT DETECTED NOT DETECTED Final   Cyclospora cayetanensis NOT DETECTED NOT DETECTED Final   Entamoeba histolytica NOT DETECTED NOT DETECTED Final   Giardia lamblia NOT DETECTED NOT DETECTED Final   Adenovirus F40/41 NOT DETECTED NOT DETECTED Final   Astrovirus NOT DETECTED NOT DETECTED Final   Norovirus GI/GII NOT DETECTED NOT DETECTED Final   Rotavirus A NOT DETECTED NOT DETECTED Final   Sapovirus (I, II, IV, and V) NOT DETECTED NOT DETECTED Final  Culture, respiratory (NON-Expectorated)     Status: None   Collection Time: 04/12/17 12:00 PM  Result Value Ref Range Status   Specimen Description TRACHEAL ASPIRATE  Final   Special Requests NONE  Final   Gram Stain   Final    MODERATE WBC PRESENT,BOTH PMN AND MONONUCLEAR MODERATE YEAST Performed at [facility], [address], [place], [place] [address]    Culture MODERATE CANDIDA TROPICALIS  Final   Report Status 04/14/2017 FINAL  Final  Culture, respiratory (NON-Expectorated)     Status: None   Collection Time: 04/15/17  7:48 AM  Result Value Ref Range Status   Specimen Description TRACHEAL ASPIRATE  Final   Special Requests Normal  Final   Gram Stain   Final    RARE WBC PRESENT, PREDOMINANTLY PMN FEW BUDDING YEAST SEEN Performed at [facility], [place] [address] [place], [place] [address]    Culture ABUNDANT CANDIDA TROPICALIS  Final    Report Status 04/18/2017 FINAL  Final  CULTURE, BLOOD (ROUTINE X 2) w Reflex to ID Panel     Status: None (Preliminary result)   Collection Time: 04/15/17 10:46 AM  Result Value Ref Range Status   Specimen Description BLOOD LEFT SUBCLAVIAN TRIPLE LUMEN  Final   Special Requests   Final    BOTTLES DRAWN AEROBIC AND ANAEROBIC Blood Culture results may not be optimal due to an excessive volume of blood received in culture bottles   Culture NO GROWTH 4 DAYS  Final   Report Status PENDING  Incomplete  CULTURE, BLOOD (ROUTINE X 2) w Reflex to ID Panel     Status: None (Preliminary result)   Collection Time: 04/16/17  1:21 PM  Result Value Ref Range Status   Specimen Description BLOOD BLOOD LEFT HAND  Final   Special Requests   Final    BOTTLES DRAWN AEROBIC AND ANAEROBIC Blood Culture results may not be optimal due to an inadequate volume of blood received in culture bottles   Culture NO GROWTH 3 DAYS  Final   Report Status PENDING  Incomplete  CULTURE, BLOOD (ROUTINE X 2) w Reflex to ID Panel     Status: None (Preliminary result)  Collection Time: 04/16/17  1:27 PM  Result Value Ref Range Status   Specimen Description BLOOD A-LINE DRAW  Final   Special Requests   Final    BOTTLES DRAWN AEROBIC AND ANAEROBIC Blood Culture adequate volume   Culture NO GROWTH 3 DAYS  Final   Report Status PENDING  Incomplete  C difficile quick scan w PCR reflex     Status: None   Collection Time: 04/16/17 10:15 PM  Result Value Ref Range Status   C Diff antigen NEGATIVE NEGATIVE Final   C Diff toxin NEGATIVE NEGATIVE Final   C Diff interpretation No C. difficile detected.  Final    Comment: VALID    Coagulation Studies: No results for input(s): LABPROT, INR in the last 72 hours.  Urinalysis: No results for input(s): COLORURINE, LABSPEC, PHURINE, GLUCOSEU, HGBUR, BILIRUBINUR, KETONESUR, PROTEINUR, UROBILINOGEN, NITRITE, LEUKOCYTESUR in the last 72 hours.  Invalid input(s): APPERANCEUR     Imaging: No results found.   Medications:   . amiodarone 60 mg/hr (04/19/17 1038)  . norepinephrine (LEVOPHED) Adult infusion Stopped (04/17/17 0330)  . phenylephrine (NEO-SYNEPHRINE) Adult infusion Stopped (04/16/17 1204)  . pureflow 3 each (04/18/17 1522)  . vasopressin (PITRESSIN) infusion - *FOR SHOCK* Stopped (04/18/17 0900)   . feeding supplement (ENSURE ENLIVE)  237 mL Oral TID BM  . ipratropium-albuterol  3 mL Nebulization Q4H  . levothyroxine  50 mcg Oral QAC breakfast  . mouth rinse  15 mL Mouth Rinse BID  . pantoprazole  40 mg Oral Daily  . sertraline  25 mg Oral Daily  . silver sulfADIAZINE   Topical BID   artificial tears, heparin, LORazepam, metoprolol tartrate, [DISCONTINUED] ondansetron **OR** ondansetron (ZOFRAN) IV, phenol, sodium chloride flush, zinc oxide  Assessment/ Plan:  [name] is a 66 y.o. white female  with hypertension, hypothyroidism, atrial fibrillation, who was admitted to [facility] on 04/08/2017 for evaluation of increasing lower extremity edema.  Hospital course: Admitted on 04/08/2017 with significant peripheral edema and cellulitis. Started on CRRT for hypotension and anuric renal failure on 7/2. Patient weaned off CRRT on 7/4. However later that day, patient had cardiac arrest and coded. Intubated, sedated and restarted on three vasopressors. Restart on CRRT on 7/4.  2-D echo which shows EF 25-30%  1. Acute renal failure with proteinuria and hematuria: baseline creatinine of 0.62 09/2014.  Patient with massive peripheral edema, anasarca and hypoalbuminemia.  Recent IV contrast exposure on 04/04/17.  - Serologic work up: compliments normal, negative SPEP/UPEP, ANA negative, ANCA negative, GBM negative, negative hepatitis - Continue CVVHD. Attempt to increase UF to 300 mL/hr.  For next 4 hrs; then 200 cc / hr - Hold further iv Albumin  2. 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 Echocardiogram with EF of 25-30% - fluid removal with CRRT as tolerated   3. Acute respiratory failure Extubated 7/9 Acute pulm edema - increase UF as above  4. 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 Hemoglobin A1c 6.2% - continue glucose control.    LOS: 11 [name] 7/12/201811:08 AM

## 2017-04-19 NOTE — Progress Notes (Signed)
Chart reviewed. Decline in status today. Received Dobhoff for nutrtion. Will hold off on swallow eval this evening. F/U in am in hopes of some improvement.

## 2017-04-19 NOTE — Progress Notes (Signed)
PT Cancellation Note  Patient Details Name: [name] MRN: [id number] DOB: December 10, 1950   Cancelled Treatment:    Reason Eval/Treat Not Completed: Medical issues which prohibited therapy.  Progressively declining over the last several hours with aggressive renal failure and multiorgan failure. DNR orders placed. Prognosis very poor with very poor chance of meaningful recovery, per critical care MD note. HR still elevated, increased difficulty breathing. Being given morphine for air hunger. R IJ duel lumen hemodialysis catheter placed today. Not appropriate for PT evaluation at this time. Will complete the order. Please re-consult if pt's medical status improves.    Collie Siad PT, DPT 04/19/2017, 4:14 PM

## 2017-04-20 LAB — RENAL FUNCTION PANEL
Albumin: 3.6 g/dL (ref 3.5–5.0)
Anion gap: 10 (ref 5–15)
BUN: 18 mg/dL (ref 6–20)
CALCIUM: 9.1 mg/dL (ref 8.9–10.3)
CHLORIDE: 102 mmol/L (ref 101–111)
CO2: 27 mmol/L (ref 22–32)
CREATININE: 0.52 mg/dL (ref 0.44–1.00)
GFR calc Af Amer: >60 mL/min (ref >60)
Glucose, Bld: 144 mg/dL — ABNORMAL HIGH (ref 65–99)
Phosphorus: 2.8 mg/dL (ref 2.5–4.6)
Potassium: 4.4 mmol/L (ref 3.5–5.1)
SODIUM: 139 mmol/L (ref 135–145)

## 2017-04-20 LAB — GLUCOSE, CAPILLARY
Glucose-Capillary: 112 mg/dL — ABNORMAL HIGH (ref 65–99)
Glucose-Capillary: 122 mg/dL — ABNORMAL HIGH (ref 65–99)
Glucose-Capillary: 140 mg/dL — ABNORMAL HIGH (ref 65–99)
Glucose-Capillary: 141 mg/dL — ABNORMAL HIGH (ref 65–99)
Glucose-Capillary: 146 mg/dL — ABNORMAL HIGH (ref 65–99)

## 2017-04-20 LAB — CULTURE, BLOOD (ROUTINE X 2): Culture: NO GROWTH

## 2017-04-20 LAB — APTT: APTT: 35 s (ref 24–36)

## 2017-04-20 LAB — MAGNESIUM
MAGNESIUM: 2 mg/dL (ref 1.7–2.4)
MAGNESIUM: 2 mg/dL (ref 1.7–2.4)

## 2017-04-20 MED ORDER — SODIUM PHOSPHATES 45 MMOLE/15ML IV SOLN
30.0000 mmol | Freq: Once | INTRAVENOUS | Status: AC
  Administered 2017-04-20: 30 mmol via INTRAVENOUS
  Filled 2017-04-20: qty 10

## 2017-04-20 MED ORDER — POTASSIUM & SODIUM PHOSPHATES 280-160-250 MG PO PACK
2.0000 | PACK | Freq: Three times a day (TID) | ORAL | Status: DC
  Filled 2017-04-20: qty 2

## 2017-04-20 MED ORDER — LEVOTHYROXINE SODIUM 50 MCG PO TABS
50.0000 ug | ORAL_TABLET | Freq: Every day | ORAL | Status: DC
  Administered 2017-04-21: 50 ug
  Filled 2017-04-20: qty 1

## 2017-04-20 MED ORDER — SERTRALINE HCL 50 MG PO TABS
25.0000 mg | ORAL_TABLET | Freq: Every day | ORAL | Status: DC
  Administered 2017-04-21: 25 mg
  Filled 2017-04-20: qty 1

## 2017-04-20 MED ORDER — IPRATROPIUM-ALBUTEROL 0.5-2.5 (3) MG/3ML IN SOLN: 3.0000 mL | RESPIRATORY_TRACT | Status: DC | PRN

## 2017-04-20 MED ORDER — POTASSIUM PHOSPHATES 15 MMOLE/5ML IV SOLN: 30.0000 mmol | Freq: Once | INTRAVENOUS | Status: DC

## 2017-04-20 NOTE — Progress Notes (Signed)
[facility]  ROUNDING NOTE   Subjective:   Patient remains critically ill.   [place] O2.. States that breathing is better compared to yesterday  Continued on CRRT UF at currently 200 cc an hour; net uf 4600 cc last 24 hrs    Objective:  Vital signs in last 24 hours:  Temp:  [96.8 F (36 C)-99.5 F (37.5 C)] 97.5 F (36.4 C) (07/13 1000) Pulse Rate:  [95-147] 98 (07/13 0900) Resp:  [13-42] 15 (07/13 1000) BP: (83-159)/(55-123) 92/61 (07/13 1000) SpO2:  [83 %-100 %] 100 % (07/13 0900) Weight:  [87.7 kg (193 lb 5.5 oz)] 87.7 kg (193 lb 5.5 oz) (07/13 0413)  Weight change: -1.3 kg (-2 lb 13.9 oz) Filed Weights   04/18/17 0500 04/19/17 0430 04/20/17 0413  Weight: 89.9 kg (198 lb 3.1 oz) 89 kg (196 lb 3.4 oz) 87.7 kg (193 lb 5.5 oz)    Intake/Output: I/O last 3 completed shifts: In: 1686.9 [I.V.:924.9; NG/GT:502; IV Piggyback:260] Out: 6222 [LNLGX:2119; Stool:15]   Intake/Output this shift:  Total I/O In: 224.7 [I.V.:99.9; NG/GT:124.8] Out: 771 [Other:771]  Physical Exam: General: Critically ill  Head: Normocephalic, atraumatic  Eyes: + jaundice  Neck: No masses   Lungs:  Bilateral crackles at bases  Heart: Irregular, tachycardia  Abdomen:  Soft, nontender, obese  Extremities: Anasarca with massive lower extremity edema.    Neurologic: Alert, able to follow commands  Skin: Bullous lesions with serous anginous drainage  Access: Right IJ temp cath 7/12 [facility])    Basic Metabolic Panel:  Recent Labs Lab 04/18/17 1427 04/18/17 2150 04/19/17 0544 04/19/17 1412 04/19/17 2124 04/20/17 0553  NA 139 137 139 138 137  --   K 4.5 4.7 4.8 4.6 4.6  --   CL 105 102 102 106 103  --   CO2 24 23 24  21* 24  --   GLUCOSE 95 89 75 84 111*  --   BUN 21* 20 19 19 19   --   CREATININE 0.55 0.61 0.57 0.52 0.56  --   CALCIUM 8.9 9.3 9.5 8.7* 9.2  --   MG 1.8 2.0 1.9 2.3 2.1 2.0  PHOS 1.8* 1.9* 2.1* 2.3* 2.0*  --     Liver Function Tests:  Recent Labs Lab  04/14/17 1422  04/15/17 0500  04/16/17 0550  04/17/17 0452  04/18/17 0505 04/18/17 1427 04/18/17 2150 04/19/17 0544 04/19/17 1412 04/19/17 2124  AST 762*  --  469*  --  219*  --  133*  --  95*  --   --  83*  --   --   ALT 1,297*  --  1,172*  --  752*  --  567*  --  402*  --   --  298*  --   --   ALKPHOS 482*  --  606*  --  608*  --  598*  --  546*  --   --   --   --   --   BILITOT 5.8*  --  5.4*  --  4.3*  --  4.7*  --  5.9*  --   --   --   --   --   PROT 4.7*  --  4.9*  --  4.7*  --  5.5*  --  6.2*  --   --   --   --   --   ALBUMIN 2.7*  2.8*  < > 2.8*  < > 2.5*  < > 3.1*  < > 3.7  4.0 3.9 4.3 4.0 4.0  < > = values in this interval not displayed. No results for input(s): LIPASE, AMYLASE in the last 168 hours. No results for input(s): AMMONIA in the last 168 hours.  CBC:  Recent Labs Lab 04/14/17 0523  04/15/17 1352 04/16/17 0550 04/17/17 0452 04/18/17 0505 04/19/17 0545  WBC 15.4*  < > 27.6* 33.4* 30.8* 27.9* 18.8*  NEUTROABS 13.2*  --  24.8*  --  28.0* 25.7* 15.0*  HGB 7.4*  < > 7.4* 7.8* 7.6* 7.4* 7.5*  HCT 22.2*  < > 22.4* 23.2* 23.1* 22.2* 23.1*  MCV 89.9  < > 90.7 92.2 92.8 94.3 96.4  PLT 43*  < > 42* 56* 42* 47* 53*  < > = values in this interval not displayed.  Cardiac Enzymes: No results for input(s): CKTOTAL, CKMB, CKMBINDEX, TROPONINI in the last 168 hours.  BNP: Invalid input(s): POCBNP  CBG:  Recent Labs Lab 04/19/17 1621 04/19/17 1952 04/20/17 0004 04/20/17 0336 04/20/17 0709  GLUCAP 102* 111* 112* 146* 141*    Microbiology: Results for orders placed or performed during the hospital encounter of 04/08/17  Culture, blood (routine x 2)     Status: None   Collection Time: 04/08/17  2:04 AM  Result Value Ref Range Status   Specimen Description BLOOD RIGHT ANTECUBITAL  Final   Special Requests   Final    BOTTLES DRAWN AEROBIC AND ANAEROBIC Blood Culture adequate volume   Culture NO GROWTH 5 DAYS  Final   Report Status 04/13/2017 FINAL  Final   Culture, blood (routine x 2)     Status: None   Collection Time: 04/08/17  2:06 AM  Result Value Ref Range Status   Specimen Description BLOOD LEFT ANTECUBITAL  Final   Special Requests   Final    BOTTLES DRAWN AEROBIC AND ANAEROBIC Blood Culture adequate volume   Culture NO GROWTH 5 DAYS  Final   Report Status 04/13/2017 FINAL  Final  C difficile quick scan w PCR reflex     Status: None   Collection Time: 04/08/17  5:09 AM  Result Value Ref Range Status   C Diff antigen NEGATIVE NEGATIVE Final   C Diff toxin NEGATIVE NEGATIVE Final   C Diff interpretation No C. difficile detected.  Final  MRSA PCR Screening     Status: None   Collection Time: 04/08/17  6:50 AM  Result Value Ref Range Status   MRSA by PCR NEGATIVE NEGATIVE Final    Comment:        The GeneXpert MRSA Assay (FDA approved for NASAL specimens only), is one component of a comprehensive MRSA colonization surveillance program. It is not intended to diagnose MRSA infection nor to guide or monitor treatment for MRSA infections.   Urine culture     Status: None   Collection Time: 04/08/17 12:16 PM  Result Value Ref Range Status   Specimen Description URINE, RANDOM  Final   Special Requests NONE  Final   Culture   Final    NO GROWTH Performed at [facility], [address], [place], [place] [address]    Report Status 04/10/2017 FINAL  Final  Aerobic Culture (superficial specimen)     Status: None   Collection Time: 04/09/17 11:32 AM  Result Value Ref Range Status   Specimen Description SKIN  Final   Special Requests NONE  Final   Gram Stain   Final    RARE WBC PRESENT, PREDOMINANTLY PMN NO SQUAMOUS EPITHELIAL CELLS SEEN  NO ORGANISMS SEEN    Culture   Final    NO GROWTH 2 DAYS Performed at [facility], [place] [address], [place], [place] [address]    Report Status 04/11/2017 FINAL  Final  Gastrointestinal Panel by PCR , Stool     Status: None   Collection Time: 04/09/17  5:10 PM  Result Value  Ref Range Status   Campylobacter species NOT DETECTED NOT DETECTED Final   Plesimonas shigelloides NOT DETECTED NOT DETECTED Final   Salmonella species NOT DETECTED NOT DETECTED Final   Yersinia enterocolitica NOT DETECTED NOT DETECTED Final   Vibrio species NOT DETECTED NOT DETECTED Final   Vibrio cholerae NOT DETECTED NOT DETECTED Final   Enteroaggregative E coli (EAEC) NOT DETECTED NOT DETECTED Final   Enteropathogenic E coli (EPEC) NOT DETECTED NOT DETECTED Final   Enterotoxigenic E coli (ETEC) NOT DETECTED NOT DETECTED Final   Shiga like toxin producing E coli (STEC) NOT DETECTED NOT DETECTED Final   Shigella/Enteroinvasive E coli (EIEC) NOT DETECTED NOT DETECTED Final   Cryptosporidium NOT DETECTED NOT DETECTED Final   Cyclospora cayetanensis NOT DETECTED NOT DETECTED Final   Entamoeba histolytica NOT DETECTED NOT DETECTED Final   Giardia lamblia NOT DETECTED NOT DETECTED Final   Adenovirus F40/41 NOT DETECTED NOT DETECTED Final   Astrovirus NOT DETECTED NOT DETECTED Final   Norovirus GI/GII NOT DETECTED NOT DETECTED Final   Rotavirus A NOT DETECTED NOT DETECTED Final   Sapovirus (I, II, IV, and V) NOT DETECTED NOT DETECTED Final  Culture, respiratory (NON-Expectorated)     Status: None   Collection Time: 04/12/17 12:00 PM  Result Value Ref Range Status   Specimen Description TRACHEAL ASPIRATE  Final   Special Requests NONE  Final   Gram Stain   Final    MODERATE WBC PRESENT,BOTH PMN AND MONONUCLEAR MODERATE YEAST Performed at [facility], [address] [place], [place] [address]    Culture MODERATE CANDIDA TROPICALIS  Final   Report Status 04/14/2017 FINAL  Final  Culture, respiratory (NON-Expectorated)     Status: None   Collection Time: 04/15/17  7:48 AM  Result Value Ref Range Status   Specimen Description TRACHEAL ASPIRATE  Final   Special Requests Normal  Final   Gram Stain   Final    RARE WBC PRESENT, PREDOMINANTLY PMN FEW BUDDING YEAST SEEN Performed at  [facility], [place] [address], [place], [place] [address]    Culture ABUNDANT CANDIDA TROPICALIS  Final   Report Status 04/18/2017 FINAL  Final  CULTURE, BLOOD (ROUTINE X 2) w Reflex to ID Panel     Status: None   Collection Time: 04/15/17 10:46 AM  Result Value Ref Range Status   Specimen Description BLOOD LEFT SUBCLAVIAN TRIPLE LUMEN  Final   Special Requests   Final    BOTTLES DRAWN AEROBIC AND ANAEROBIC Blood Culture results may not be optimal due to an excessive volume of blood received in culture bottles   Culture NO GROWTH 5 DAYS  Final   Report Status 04/20/2017 FINAL  Final  CULTURE, BLOOD (ROUTINE X 2) w Reflex to ID Panel     Status: None (Preliminary result)   Collection Time: 04/16/17  1:21 PM  Result Value Ref Range Status   Specimen Description BLOOD BLOOD LEFT HAND  Final   Special Requests   Final    BOTTLES DRAWN AEROBIC AND ANAEROBIC Blood Culture results may not be optimal due to an inadequate volume of blood  received in culture bottles   Culture NO GROWTH 4 DAYS  Final   Report Status PENDING  Incomplete  CULTURE, BLOOD (ROUTINE X 2) w Reflex to ID Panel     Status: None (Preliminary result)   Collection Time: 04/16/17  1:27 PM  Result Value Ref Range Status   Specimen Description BLOOD A-LINE DRAW  Final   Special Requests   Final    BOTTLES DRAWN AEROBIC AND ANAEROBIC Blood Culture adequate volume   Culture NO GROWTH 4 DAYS  Final   Report Status PENDING  Incomplete  C difficile quick scan w PCR reflex     Status: None   Collection Time: 04/16/17 10:15 PM  Result Value Ref Range Status   C Diff antigen NEGATIVE NEGATIVE Final   C Diff toxin NEGATIVE NEGATIVE Final   C Diff interpretation No C. difficile detected.  Final    Comment: VALID    Coagulation Studies: No results for input(s): LABPROT, INR in the last 72 hours.  Urinalysis: No results for input(s): COLORURINE, LABSPEC, PHURINE, GLUCOSEU, HGBUR, BILIRUBINUR, KETONESUR, PROTEINUR,  UROBILINOGEN, NITRITE, LEUKOCYTESUR in the last 72 hours.  Invalid input(s): APPERANCEUR    Imaging: Dg Chest Port 1 View  Result Date: 04/19/2017 CLINICAL DATA:  Catheter placement EXAM: PORTABLE CHEST 1 VIEW COMPARISON:  04/17/2017 FINDINGS: 1236 hours. New right IJ central line is identified with tip positioned over the distal SVC near the RA junction. No evidence for right pneumothorax. Left IJ central line tip also projects distal SVC level. The cardio pericardial silhouette is enlarged. Bibasilar collapse/consolidation similar to prior with vascular congestion and features suggesting pulmonary edema. IMPRESSION: 1. New right IJ central line tip overlies the distal SVC level near the junction with the RA. No evidence for pneumothorax. 2. Cardiomegaly with vascular congestion and pulmonary edema. 3. Bibasilar collapse/consolidation with probable small bilateral pleural effusions. Electronically Signed   By: Misty Stanley M.D.   On: 04/19/2017 13:04   Dg Abd Portable 1v  Result Date: 04/19/2017 CLINICAL DATA:  Feeding tube placement EXAM: PORTABLE ABDOMEN - 1 VIEW COMPARISON:  04/11/2017 FINDINGS: Feeding tube is in place with the tip in the mid stomach. Right groin dialysis catheter in place with the tip overlying the L3 vertebral body. Nonobstructive bowel gas pattern. IMPRESSION: Feeding tube tip in the mid stomach Electronically Signed   By: Rolm Baptise M.D.   On: 04/19/2017 16:02     Medications:   . amiodarone 60 mg/hr (04/20/17 0557)  . feeding supplement (VITAL AF 1.2 CAL) 1,000 mL (04/20/17 0931)  . pureflow 3 each (04/20/17 0418)   . feeding supplement (ENSURE ENLIVE)  237 mL Oral TID BM  . [START ON 04/21/2017] levothyroxine  50 mcg Per Tube QAC breakfast  . mouth rinse  15 mL Mouth Rinse BID  . [START ON 04/21/2017] sertraline  25 mg Per Tube Daily  . silver sulfADIAZINE   Topical BID   artificial tears, heparin, ipratropium-albuterol, LORazepam, metoprolol tartrate,  morphine injection, [DISCONTINUED] ondansetron **OR** ondansetron (ZOFRAN) IV, phenol, sodium chloride flush, zinc oxide  Assessment/ Plan:  [name] is a 66 y.o. white female  with hypertension, hypothyroidism, atrial fibrillation, who was admitted to [facility] on 04/08/2017 for evaluation of increasing lower extremity edema.  Hospital course: Admitted on 04/08/2017 with significant peripheral edema and cellulitis. Started on CRRT for hypotension and anuric renal failure on 7/2. Patient weaned off CRRT on 7/4. However later that day, patient had cardiac arrest and coded. Intubated, sedated and restarted  on three vasopressors. Restart on CRRT on 7/4.  2-D echo which shows EF 25-30%  1. Acute renal failure with proteinuria and hematuria: baseline creatinine of 0.62 09/2014.  Patient with massive peripheral edema, anasarca and hypoalbuminemia.  Recent IV contrast exposure on 04/04/17.  - Serologic work up: compliments normal, negative SPEP/UPEP, ANA negative, ANCA negative, GBM negative, negative hepatitis - Continue CVVHD. Continue UF at 200 cc / hr - neg 4600 cc last 24 hrs - Hold further iv Albumin  2. 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 Echocardiogram with EF of 25-30% - fluid removal with CRRT as tolerated   3. Acute respiratory failure Extubated 7/9 Acute pulm edema - increase UF as above  4. 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 Hemoglobin A1c 6.2% - continue glucose control.    LOS: 12 [name] 7/13/201811:28 AM

## 2017-04-20 NOTE — Progress Notes (Addendum)
Patient unable to maintain blood pressure WDL, patient CRRT blood flow rate decreased to 250 with positive results, NP Magdeline notified, orders received for levophed will continue to monitor for changes/need.  0640 Patient appears comfortable in bed, no request for need.  CRRT continues patient has stayed awake majority of shift, encouraged to attempts sleep. Patient does express fear in falling asleep. Support given will continue to assess for changes/need.

## 2017-04-20 NOTE — Progress Notes (Signed)
Patient ID: [name], female   DOB: 05-02-1951, 65 y.o.   MRN: [id number]     [facility] PROGRESS NOTE  [name] UYQ:[id number] DOB: Mar 26, 1951 DOA: 04/08/2017 PCP: Tracie Harrier, MD  HPI/Subjective: Patient told her husband that a cult was after her. She is anxious about this. Her husband told her that he would protect her. The patient did not sleep last night as per the nursing staff. The patient states she feels okay. Still complains of a little sore throat.  Objective: Vitals:   04/20/17 1300 04/20/17 1400  BP: 114/77 102/70  Pulse: (!) 106 (!) 114  Resp: (!) 36 (!) 26  Temp: (!) 97.5 F (36.4 C) (!) 97.3 F (36.3 C)    Filed Weights   04/18/17 0500 04/19/17 0430 04/20/17 0413  Weight: 89.9 kg (198 lb 3.1 oz) 89 kg (196 lb 3.4 oz) 87.7 kg (193 lb 5.5 oz)    ROS: Review of Systems  Unable to perform ROS: Mental acuity  Respiratory: Negative for cough and shortness of breath.   Cardiovascular: Negative for chest pain.  Gastrointestinal: Negative for abdominal pain and diarrhea.  Musculoskeletal: Negative for joint pain.    Exam: Physical Exam  HENT:  Head: Atraumatic.  Nose: No mucosal edema.  Mouth/Throat: No oropharyngeal exudate or posterior oropharyngeal edema.  Eyes: Pupils are equal, round, and reactive to light. Lids are normal.  Icteric sclera  Neck: Carotid bruit is not present. No thyromegaly present.  Cardiovascular: Regular rhythm, S1 normal, S2 normal and normal heart sounds.   Respiratory: She has decreased breath sounds in the right lower field and the left lower field. She has no wheezes. She has rhonchi in the left lower field. She has no rales.  GI: Soft. Bowel sounds are normal. There is no tenderness.  Musculoskeletal:       Right ankle: She exhibits swelling.       Left ankle: She exhibits swelling.  Lymphadenopathy:    She has no cervical adenopathy.  Neurological: She is alert.  Able to wiggle toes on the left foot. Not able  to move the left leg. Able to move the right leg a little bit. Able to squeeze my hands bilaterally.  Skin: Skin is warm. No cyanosis.  Left lower extremity covered today Right foot with some blisters that are healing Bruising right neck and left thigh  Psychiatric: Her affect is blunt.      Data Reviewed: Basic Metabolic Panel:  Recent Labs Lab 04/18/17 1427 04/18/17 2150 04/19/17 0544 04/19/17 1412 04/19/17 2124 04/20/17 0553 04/20/17 1401  NA 139 137 139 138 137  --   --   K 4.5 4.7 4.8 4.6 4.6  --   --   CL 105 102 102 106 103  --   --   CO2 24 23 24  21* 24  --   --   GLUCOSE 95 89 75 84 111*  --   --   BUN 21* 20 19 19 19   --   --   CREATININE 0.55 0.61 0.57 0.52 0.56  --   --   CALCIUM 8.9 9.3 9.5 8.7* 9.2  --   --   MG 1.8 2.0 1.9 2.3 2.1 2.0 2.0  PHOS 1.8* 1.9* 2.1* 2.3* 2.0*  --   --    Liver Function Tests: CBC:  Recent Labs Lab 04/14/17 0523  04/15/17 1352 04/16/17 0550 04/17/17 0452 04/18/17 0505 04/19/17 0545  WBC 15.4*  < > 27.6* 33.4* 30.8* 27.9* 18.8*  NEUTROABS 13.2*  --  24.8*  --  28.0* 25.7* 15.0*  HGB 7.4*  < > 7.4* 7.8* 7.6* 7.4* 7.5*  HCT 22.2*  < > 22.4* 23.2* 23.1* 22.2* 23.1*  MCV 89.9  < > 90.7 92.2 92.8 94.3 96.4  PLT 43*  < > 42* 56* 42* 47* 53*  < > = values in this interval not displayed. BNP (last 3 results)  Recent Labs  04/08/17 0139  BNP 2,012.0*     CBG:  Recent Labs Lab 04/19/17 1952 04/20/17 0004 04/20/17 0336 04/20/17 0709 04/20/17 1143  GLUCAP 111* 112* 146* 141* 122*    Recent Results (from the past 240 hour(s))  Culture, respiratory (NON-Expectorated)     Status: None   Collection Time: 04/12/17 12:00 PM  Result Value Ref Range Status   Specimen Description TRACHEAL ASPIRATE  Final   Special Requests NONE  Final   Gram Stain   Final    MODERATE WBC PRESENT,BOTH PMN AND MONONUCLEAR MODERATE YEAST Performed at [facility], [place] [address], [place], [place] [address]    Culture MODERATE  CANDIDA TROPICALIS  Final   Report Status 04/14/2017 FINAL  Final  Culture, respiratory (NON-Expectorated)     Status: None   Collection Time: 04/15/17  7:48 AM  Result Value Ref Range Status   Specimen Description TRACHEAL ASPIRATE  Final   Special Requests Normal  Final   Gram Stain   Final    RARE WBC PRESENT, PREDOMINANTLY PMN FEW BUDDING YEAST SEEN Performed at [facility], [place] [address], [place], [place] [address]    Culture ABUNDANT CANDIDA TROPICALIS  Final   Report Status 04/18/2017 FINAL  Final  CULTURE, BLOOD (ROUTINE X 2) w Reflex to ID Panel     Status: None   Collection Time: 04/15/17 10:46 AM  Result Value Ref Range Status   Specimen Description BLOOD LEFT SUBCLAVIAN TRIPLE LUMEN  Final   Special Requests   Final    BOTTLES DRAWN AEROBIC AND ANAEROBIC Blood Culture results may not be optimal due to an excessive volume of blood received in culture bottles   Culture NO GROWTH 5 DAYS  Final   Report Status 04/20/2017 FINAL  Final  CULTURE, BLOOD (ROUTINE X 2) w Reflex to ID Panel     Status: None (Preliminary result)   Collection Time: 04/16/17  1:21 PM  Result Value Ref Range Status   Specimen Description BLOOD BLOOD LEFT HAND  Final   Special Requests   Final    BOTTLES DRAWN AEROBIC AND ANAEROBIC Blood Culture results may not be optimal due to an inadequate volume of blood received in culture bottles   Culture NO GROWTH 4 DAYS  Final   Report Status PENDING  Incomplete  CULTURE, BLOOD (ROUTINE X 2) w Reflex to ID Panel     Status: None (Preliminary result)   Collection Time: 04/16/17  1:27 PM  Result Value Ref Range Status   Specimen Description BLOOD A-LINE DRAW  Final   Special Requests   Final    BOTTLES DRAWN AEROBIC AND ANAEROBIC Blood Culture adequate volume   Culture NO GROWTH 4 DAYS  Final   Report Status PENDING  Incomplete  C difficile quick scan w PCR reflex     Status: None   Collection Time: 04/16/17 10:15 PM  Result Value Ref Range Status    C Diff antigen NEGATIVE NEGATIVE Final   C Diff toxin NEGATIVE NEGATIVE Final   C Diff interpretation No  C. difficile detected.  Final    Comment: VALID     Studies: Dg Chest Port 1 View  Result Date: 04/19/2017 CLINICAL DATA:  Catheter placement EXAM: PORTABLE CHEST 1 VIEW COMPARISON:  04/17/2017 FINDINGS: 1236 hours. New right IJ central line is identified with tip positioned over the distal SVC near the RA junction. No evidence for right pneumothorax. Left IJ central line tip also projects distal SVC level. The cardio pericardial silhouette is enlarged. Bibasilar collapse/consolidation similar to prior with vascular congestion and features suggesting pulmonary edema. IMPRESSION: 1. New right IJ central line tip overlies the distal SVC level near the junction with the RA. No evidence for pneumothorax. 2. Cardiomegaly with vascular congestion and pulmonary edema. 3. Bibasilar collapse/consolidation with probable small bilateral pleural effusions. Electronically Signed   By: Misty Stanley M.D.   On: 04/19/2017 13:04   Dg Abd Portable 1v  Result Date: 04/19/2017 CLINICAL DATA:  Feeding tube placement EXAM: PORTABLE ABDOMEN - 1 VIEW COMPARISON:  04/11/2017 FINDINGS: Feeding tube is in place with the tip in the mid stomach. Right groin dialysis catheter in place with the tip overlying the L3 vertebral body. Nonobstructive bowel gas pattern. IMPRESSION: Feeding tube tip in the mid stomach Electronically Signed   By: Rolm Baptise M.D.   On: 04/19/2017 16:02    Scheduled Meds: . feeding supplement (ENSURE ENLIVE)  237 mL Oral TID BM  . [START ON 04/21/2017] levothyroxine  50 mcg Per Tube QAC breakfast  . mouth rinse  15 mL Mouth Rinse BID  . [START ON 04/21/2017] sertraline  25 mg Per Tube Daily  . silver sulfADIAZINE   Topical BID   Continuous Infusions: . amiodarone 30 mg/hr (04/20/17 1100)  . feeding supplement (VITAL AF 1.2 CAL) 1,000 mL (04/20/17 0931)  . pureflow 3 each (04/20/17 0418)     Assessment/Plan:  1. Septic shock with multiorgan failure. Overall prognosis is poor. Patient currently a DO NOT RESUSCITATE 2. PEA Cardiopulmonary arrest.  3. Shock liver from cardiopulmonary arrest. Liver function trending better  except for Total bilirubin. 4. 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 Patient Not making any urine. 5. Acute encephalopathy. Patient now with delirium. Hopefully the patient will be able to sleep tonight. 6. Acute respiratory failure status post extubation and on nasal cannula 7. Hypomagnesemia, hypophosphatemia and hypokalemia. Resolved 8. Atrial fibrillation on amiodarone for rate control. Pradaxa on hold. case discussed with critical care specialist that they may try to change the amiodarone.  9. Coagulopathy. Could be secondary to shock liver. 10. Diarrhea. resolved  11. Cardiomyopathy on echocardiogram. 12. Thrombocytopenia could be secondary to sepsis. Continue to watch platelet count 13. Anemia- continue to watch hemoglobin closely 14. Leukocytosis.  15. Hypothyroidism unspecified on levothyroxine 16. Depression on Zoloft 17. Nutritional status. Patient unable to keep up with her nutritional needs. Dobbhoff tube placed for feeding  Code Status:     Code Status Orders        Start     Ordered   04/08/17 0651  Full code  Continuous     04/08/17 0650    Code Status History    Date Active Date Inactive Code Status Order ID Comments User Context   This patient has a current code status but no historical code status.     Family Communication: Spoke with Husband at bedside Disposition Plan: To be determined based on clinical course  Consultants:  Critical care specialist  Infectious disease  Nephrology  Palliative care  Cardiology  Antibiotics:  Stopped  Time spent: 22 minutes. Case discussed with critical care specialist team  [place], [facility]

## 2017-04-20 NOTE — Plan of Care (Signed)
Problem: Physical Regulation: Goal: Ability to maintain clinical measurements within normal limits will improve Outcome: Not Progressing Patient alert and oriented to person and at times place, disoriented to time and situation, has episodes of more confusion and paranoia. Remains in A.Fib HR 90-110, on continuous infusion of amiodarone. BP remains stable. Oxygen saturation stable on 2L [place]. No urine output this shift, remains on CRRT. Dobhoff in place infusing tube feeds, only nutrition this shift was from tube feeds, patient refused anything by mouth. Patient became hypothermic, Bair hugger placed back on patient, removed overnight. Husband remains at bedside and has been updated as necessary.

## 2017-04-20 NOTE — Progress Notes (Signed)
Pharmacy CRRT Dose Adjustment  Marcianne Ozbun is a 66 y.o. female admitted on 04/08/2017 with progressive edema with bullae and cellulitis and anasarca. Patient is currently requiring CRRT. Antibiotics were stopped on 7/10; continue to monitor for signs and symptoms of infection.   Plan:  No medications require adjustment for CRRT at present. Will continue to monitor.     Height: 5\' 5"  (165.1 cm) Weight: 193 lb 5.5 oz (87.7 kg) IBW/kg (Calculated) : 57  Temp (24hrs), Avg:98.3 F (36.8 C), Min:96.8 F (36 C), Max:99.5 F (37.5 C)   Recent Labs Lab 04/15/17 1352  04/16/17 0550  04/16/17 1545  04/17/17 0452  04/18/17 0505 04/18/17 1427 04/18/17 2150 04/19/17 0544 04/19/17 0545 04/19/17 1412 04/19/17 2124  WBC 27.6*  --  33.4*  --   --   --  30.8*  --  27.9*  --   --   --  18.8*  --   --   CREATININE  --   < >  --   < >  --   < > 0.47  < > 0.53 0.55 0.61 0.57  --  0.52 0.56  VANCORANDOM  --   --   --   --  12  --   --   --   --   --   --   --   --   --   --   < > = values in this interval not displayed.  Estimated Creatinine Clearance: 76.7 mL/min (by C-G formula based on SCr of 0.56 mg/dL).    No Known Allergies  Antimicrobials this admission: Vancomycin 7/1 >> 7/2,  7/6 >>7/10 Zosyn 7/1 >> 7/2, 7/3 >> 7/10  Dose adjustments this admission:  Microbiology results: Wound Cx: NGTD UCx: NG BCx: NGTD GI PCR: negative C diff: negative MRSA PCR: negative  Thank you for allowing pharmacy to be a part of this patient's care.  Angela Vazguez L 04/20/2017 2:02 PM

## 2017-04-20 NOTE — Evaluation (Signed)
Clinical/Bedside Swallow Evaluation Patient Details  Name: [name] MRN: [id number] Date of Birth: 03-19-1951  Today's Date: 04/20/2017 Time: SLP Start Time (ACUTE ONLY): 0950 SLP Stop Time (ACUTE ONLY): 1050 SLP Time Calculation (min) (ACUTE ONLY): 60 min  Past Medical History:  Past Medical History:  Diagnosis Date  . A-fib ([place])    on pradaxa  . Hypertension   . Thyroid disease    Past Surgical History:  Past Surgical History:  Procedure Laterality Date  . WRIST ARTHROSCOPY     HPI:  Pt 66 y.o. F with PMH of CAF, hypothyroidism admitted via ED to ICU with many days of progressive LE edema, blistering lesions on BLE, progressive weakness. Adm diagnosis of septic shock, cellulitis, AKI.  Pt PEA arrested 07/4 ACLS protocol initiated ROSC within 2 minutes following interventions intubated during cardiac arrest and extubated 07/9. Pt is more alert though confused/delirious at times. Pt does not desire any oral intake per report. Pt's husband has been getting her to take bites/sips, however, a dobhoff was placed yesterday for nutritional support (a regular diet order is still in place). No reports of s/s of aspiration have been noted by Husband and [name] staff.    Assessment / Plan / Recommendation Clinical Impression  Pt appears at increased risk for aspiration secondary to overall oropharyngeal phase dysphagia c/b reduced lingual-labial oral movements during bolus management and swallowing. Pt also has a dobhoff in place and has declined Cognitive status. These issues can increase risk for aspiration and impact oropharyngeal phases of swallowing. Pt requires full feeding support and verbal cues for follow through w/ task of swallowing. Pt appeared able to adequately tolerate trials of milkshake and magic cup w/ dobhoff in place as well as thin liquids but often required pinched straw delivery. Due to presence of dobhoff and Cognitive status, recommend more of a clear liquid diet w/ the magic  cup and milkshakes/Ensure supplements w/ strict aspiration precautions and feeding support; strict aspiration precautions.  SLP Visit Diagnosis: Dysphagia, oropharyngeal phase (R13.12)    Aspiration Risk  Mild aspiration risk;Moderate aspiration risk    Diet Recommendation  Liquid diet including clears in addition to milkshakes, magic cup, ensure - thin liquids(while dobhoff is in place); aspiration precautions; feeding support  Medication Administration: Crushed with puree (dobhoff)    Other  Recommendations Recommended Consults:  (Dietician f/u) Oral Care Recommendations: Oral care QID;Staff/trained caregiver to provide oral care   Follow up Recommendations  (TBD)      Frequency and Duration min 2x/week  2 weeks       Prognosis Prognosis for Safe Diet Advancement: Guarded Barriers to Reach Goals: Cognitive deficits;Severity of deficits;Behavior      Swallow Study   General Date of Onset: 04/08/17 HPI: Pt 66 y.o. F with PMH of CAF, hypothyroidism admitted via ED to ICU with many days of progressive LE edema, blistering lesions on BLE, progressive weakness. Adm diagnosis of septic shock, cellulitis, AKI.  Pt PEA arrested 07/4 ACLS protocol initiated ROSC within 2 minutes following interventions intubated during cardiac arrest and extubated 07/9. Pt is more alert though confused/delirious at times. Pt does not desire any oral intake per report. Pt's husband has been getting her to take bites/sips, however, a dobhoff was placed yesterday for nutritional support (a regular diet order is still in place). No reports of s/s of aspiration have been noted by Husband and [name] staff.  Type of Study: Bedside Swallow Evaluation Previous Swallow Assessment: none Diet Prior to this Study: Regular;Thin liquids  Temperature Spikes Noted: No (wbc 18.8) Respiratory Status: Nasal cannula (2 liters) History of Recent Intubation: Yes Length of Intubations (days): 5 days Date extubated:  04/16/17 Behavior/Cognition: Confused;Requires cueing (awake, min verbal; blunt affect) Oral Cavity Assessment: Dry (appears reddened) Oral Care Completed by SLP: Recent completion by staff Oral Cavity - Dentition: Adequate natural dentition Vision:  (n/a) Self-Feeding Abilities: Total assist Patient Positioning: Upright in bed Baseline Vocal Quality: Low vocal intensity (thick but c/o dry throat) Volitional Cough: Cognitively unable to elicit Volitional Swallow: Unable to elicit    Oral/Motor/Sensory Function Overall Oral Motor/Sensory Function:  (reduced movements overall - moderate)   Ice Chips Ice chips: Not tested   Thin Liquid Thin Liquid: Impaired Presentation: Straw;Cup (pinched straw trials - 8) Oral Phase Impairments: Reduced labial seal;Reduced lingual movement/coordination;Poor awareness of bolus (min+) Oral Phase Functional Implications: Prolonged oral transit Pharyngeal  Phase Impairments: Suspected delayed Swallow (min) Other Comments: no overt s/s of aspiration noted    Nectar Thick Nectar Thick Liquid: Not tested   Honey Thick Honey Thick Liquid: Not tested   Puree Puree: Impaired Presentation: Spoon (fed; 6 trials total) Oral Phase Impairments: Reduced labial seal;Reduced lingual movement/coordination;Poor awareness of bolus Oral Phase Functional Implications: Prolonged oral transit Pharyngeal Phase Impairments: Suspected delayed Swallow (min) Other Comments: no overt s/s of aspiration noted w/ trials   Solid   GO   Solid: Not tested Other Comments: dobhoff tube in place         Orinda Kenner, MS, CCC-SLP Watson,Katherine 04/20/2017,3:52 PM

## 2017-04-21 ENCOUNTER — Inpatient Hospital Stay: Payer: Medicare Other

## 2017-04-21 DIAGNOSIS — E43 Unspecified severe protein-calorie malnutrition: Secondary | ICD-10-CM

## 2017-04-21 LAB — RENAL FUNCTION PANEL
ALBUMIN: 3.5 g/dL (ref 3.5–5.0)
ANION GAP: 6 (ref 5–15)
ANION GAP: 6 (ref 5–15)
ANION GAP: 9 (ref 5–15)
Albumin: 3.9 g/dL (ref 3.5–5.0)
Albumin: 3.4 g/dL — ABNORMAL LOW (ref 3.5–5.0)
Albumin: 3.5 g/dL (ref 3.5–5.0)
Anion gap: 5 (ref 5–15)
BUN: 19 mg/dL (ref 6–20)
BUN: 19 mg/dL (ref 6–20)
BUN: 23 mg/dL — AB (ref 6–20)
BUN: 23 mg/dL — ABNORMAL HIGH (ref 6–20)
CALCIUM: 8.6 mg/dL — AB (ref 8.9–10.3)
CALCIUM: 8.8 mg/dL — AB (ref 8.9–10.3)
CHLORIDE: 103 mmol/L (ref 101–111)
CHLORIDE: 102 mmol/L (ref 101–111)
CO2: 26 mmol/L (ref 22–32)
CO2: 27 mmol/L (ref 22–32)
CO2: 28 mmol/L (ref 22–32)
CO2: 28 mmol/L (ref 22–32)
Calcium: 9.2 mg/dL (ref 8.9–10.3)
Calcium: 8.6 mg/dL — ABNORMAL LOW (ref 8.9–10.3)
Chloride: 103 mmol/L (ref 101–111)
Chloride: 104 mmol/L (ref 101–111)
Creatinine, Ser: 0.48 mg/dL (ref 0.44–1.00)
Creatinine, Ser: 0.54 mg/dL (ref 0.44–1.00)
Creatinine, Ser: 0.56 mg/dL (ref 0.44–1.00)
Creatinine, Ser: 0.56 mg/dL (ref 0.44–1.00)
GFR calc Af Amer: >60 mL/min (ref >60)
GFR calc Af Amer: >60 mL/min (ref >60)
GFR calc non Af Amer: >60 mL/min (ref >60)
GFR calc non Af Amer: >60 mL/min (ref >60)
Glucose, Bld: 105 mg/dL — ABNORMAL HIGH (ref 65–99)
Glucose, Bld: 112 mg/dL — ABNORMAL HIGH (ref 65–99)
Glucose, Bld: 117 mg/dL — ABNORMAL HIGH (ref 65–99)
Glucose, Bld: 127 mg/dL — ABNORMAL HIGH (ref 65–99)
PHOSPHORUS: 1.6 mg/dL — AB (ref 2.5–4.6)
POTASSIUM: 4.2 mmol/L (ref 3.5–5.1)
POTASSIUM: 4.5 mmol/L (ref 3.5–5.1)
Phosphorus: 2.6 mg/dL (ref 2.5–4.6)
Phosphorus: 1.2 mg/dL — ABNORMAL LOW (ref 2.5–4.6)
Potassium: 4.3 mmol/L (ref 3.5–5.1)
Potassium: 4.4 mmol/L (ref 3.5–5.1)
SODIUM: 136 mmol/L (ref 135–145)
Sodium: 135 mmol/L (ref 135–145)
Sodium: 137 mmol/L (ref 135–145)
Sodium: 139 mmol/L (ref 135–145)

## 2017-04-21 LAB — CULTURE, BLOOD (ROUTINE X 2)
CULTURE: NO GROWTH
CULTURE: NO GROWTH
Special Requests: ADEQUATE

## 2017-04-21 LAB — CBC
HEMATOCRIT: 23.1 % — AB (ref 35.0–47.0)
HEMOGLOBIN: 7.5 g/dL — AB (ref 12.0–16.0)
MCH: 31.5 pg (ref 26.0–34.0)
MCHC: 32.3 g/dL (ref 32.0–36.0)
MCV: 97.4 fL (ref 80.0–100.0)
Platelets: 58 10*3/uL — ABNORMAL LOW (ref 150–440)
RBC: 2.38 MIL/uL — AB (ref 3.80–5.20)
RDW: 18.3 % — ABNORMAL HIGH (ref 11.5–14.5)
WBC: 13 10*3/uL — AB (ref 3.6–11.0)

## 2017-04-21 LAB — COMPREHENSIVE METABOLIC PANEL
ALK PHOS: 462 U/L — AB (ref 38–126)
ALT: 190 U/L — AB (ref 14–54)
AST: 67 U/L — AB (ref 15–41)
Albumin: 3.5 g/dL (ref 3.5–5.0)
Anion gap: 8 (ref 5–15)
BILIRUBIN TOTAL: 4 mg/dL — AB (ref 0.3–1.2)
BUN: 20 mg/dL (ref 6–20)
CHLORIDE: 102 mmol/L (ref 101–111)
CO2: 27 mmol/L (ref 22–32)
CREATININE: 0.65 mg/dL (ref 0.44–1.00)
Calcium: 8.9 mg/dL (ref 8.9–10.3)
GFR calc Af Amer: >60 mL/min (ref >60)
Glucose, Bld: 112 mg/dL — ABNORMAL HIGH (ref 65–99)
Potassium: 4.5 mmol/L (ref 3.5–5.1)
Sodium: 137 mmol/L (ref 135–145)
TOTAL PROTEIN: 6.2 g/dL — AB (ref 6.5–8.1)

## 2017-04-21 LAB — MAGNESIUM
MAGNESIUM: 1.8 mg/dL (ref 1.7–2.4)
Magnesium: 1.6 mg/dL — ABNORMAL LOW (ref 1.7–2.4)
Magnesium: 1.8 mg/dL (ref 1.7–2.4)
Magnesium: 1.8 mg/dL (ref 1.7–2.4)

## 2017-04-21 LAB — GLUCOSE, CAPILLARY
Glucose-Capillary: 103 mg/dL — ABNORMAL HIGH (ref 65–99)
Glucose-Capillary: 112 mg/dL — ABNORMAL HIGH (ref 65–99)
Glucose-Capillary: 114 mg/dL — ABNORMAL HIGH (ref 65–99)
Glucose-Capillary: 122 mg/dL — ABNORMAL HIGH (ref 65–99)
Glucose-Capillary: 136 mg/dL — ABNORMAL HIGH (ref 65–99)

## 2017-04-21 LAB — APTT: aPTT: 36 seconds (ref 24–36)

## 2017-04-21 MED ORDER — POTASSIUM & SODIUM PHOSPHATES 280-160-250 MG PO PACK
2.0000 | PACK | ORAL | Status: AC
  Administered 2017-04-21 (×2): 2 via ORAL
  Filled 2017-04-21 (×2): qty 2

## 2017-04-21 MED ORDER — K PHOS MONO-SOD PHOS DI & MONO 155-852-130 MG PO TABS
500.0000 mg | ORAL_TABLET | ORAL | Status: DC
  Filled 2017-04-21 (×2): qty 2

## 2017-04-21 MED ORDER — MAGNESIUM SULFATE 2 GM/50ML IV SOLN
2.0000 g | Freq: Once | INTRAVENOUS | Status: AC
  Administered 2017-04-21: 2 g via INTRAVENOUS
  Filled 2017-04-21: qty 50

## 2017-04-21 MED ORDER — LEVOTHYROXINE SODIUM 50 MCG PO TABS
75.0000 ug | ORAL_TABLET | Freq: Every day | ORAL | Status: DC
  Administered 2017-04-22 – 2017-04-25 (×4): 75 ug
  Filled 2017-04-21 (×4): qty 2

## 2017-04-21 MED ORDER — POTASSIUM PHOSPHATES 15 MMOLE/5ML IV SOLN
20.0000 mmol | Freq: Once | INTRAVENOUS | Status: AC
  Administered 2017-04-21: 20 mmol via INTRAVENOUS
  Filled 2017-04-21: qty 6.67

## 2017-04-21 MED ORDER — AMIODARONE HCL 200 MG PO TABS
400.0000 mg | ORAL_TABLET | Freq: Two times a day (BID) | ORAL | Status: DC
  Administered 2017-04-21 – 2017-04-25 (×9): 400 mg
  Filled 2017-04-21 (×9): qty 2

## 2017-04-21 MED ORDER — ALBUMIN HUMAN 25 % IV SOLN
12.5000 g | Freq: Three times a day (TID) | INTRAVENOUS | Status: DC
  Administered 2017-04-21 – 2017-04-22 (×3): 12.5 g via INTRAVENOUS
  Filled 2017-04-21 (×5): qty 50

## 2017-04-21 MED ORDER — TRAZODONE HCL 50 MG PO TABS
50.0000 mg | ORAL_TABLET | Freq: Every day | ORAL | Status: DC
  Administered 2017-04-21: 50 mg
  Filled 2017-04-21: qty 1

## 2017-04-21 MED ORDER — NOREPINEPHRINE BITARTRATE 1 MG/ML IV SOLN
0.0000 ug/min | INTRAVENOUS | Status: DC
  Filled 2017-04-21: qty 16

## 2017-04-21 NOTE — Progress Notes (Signed)
Patient ID: [name], female   DOB: 12-Jul-1951, 66 y.o.   MRN: [id number]     [facility] PROGRESS NOTE  Laruen Risser IOM:[id number] DOB: 1951-03-23 DOA: 04/08/2017 PCP: Tracie Harrier, MD  HPI/Subjective: Patient did not sleep much last night at all. Answers no to most questions. Still with some periods of confusion.  Objective: Vitals:   04/21/17 1325 04/21/17 1400  BP: 103/69 91/63  Pulse: (!) 110   Resp: 17 11  Temp: 97.9 F (36.6 C) 97.7 F (36.5 C)    Filed Weights   04/19/17 0430 04/20/17 0413 04/21/17 0500  Weight: 89 kg (196 lb 3.4 oz) 87.7 kg (193 lb 5.5 oz) 85.1 kg (187 lb 9.8 oz)    ROS: Review of Systems  Unable to perform ROS: Mental acuity  Respiratory: Negative for cough and shortness of breath.   Cardiovascular: Negative for chest pain.  Gastrointestinal: Negative for abdominal pain and diarrhea.  Musculoskeletal: Negative for joint pain.    Exam: Physical Exam  HENT:  Head: Atraumatic.  Nose: No mucosal edema.  Mouth/Throat: No oropharyngeal exudate or posterior oropharyngeal edema.  Eyes: Pupils are equal, round, and reactive to light. Lids are normal.  Icteric sclera  Neck: Carotid bruit is not present. No thyromegaly present.  Cardiovascular: Regular rhythm, S1 normal, S2 normal and normal heart sounds.   Respiratory: She has decreased breath sounds in the right lower field and the left lower field. She has no wheezes. She has rhonchi in the left lower field. She has no rales.  GI: Soft. Bowel sounds are normal. There is no tenderness.  Musculoskeletal:       Right ankle: She exhibits swelling.       Left ankle: She exhibits swelling.  Lymphadenopathy:    She has no cervical adenopathy.  Neurological: She is alert.  Able to wiggle toes bilateral feet. Able to lift arms up off the bed.  Skin: Skin is warm. No cyanosis.  Left lower extremity covered today Right foot with some blisters that are healing Bruising right neck and left  thigh  Psychiatric: Her affect is blunt.      Data Reviewed: Basic Metabolic Panel:  Recent Labs Lab 04/19/17 2124 04/20/17 0553 04/20/17 1401 04/21/17 0025 04/21/17 0500 04/21/17 1314  NA 137 139 139 137 137 135  K 4.6 4.4 4.3 4.2 4.5 4.5  CL 103 102 103 103 102 102  CO2 24 27 27 28 27 28   GLUCOSE 111* 144* 127* 105* 112* 117*  BUN 19 18 19 19 20  23*  CREATININE 0.56 0.52 0.56 0.48 0.65 0.54  CALCIUM 9.2 9.1 9.2 8.8* 8.9 8.6*  MG 2.1 2.0 2.0 1.8 1.8 1.8  PHOS 2.0* 2.8 2.6 1.6*  --  1.2*   Liver Function Tests: CBC:  Recent Labs Lab 04/15/17 1352 04/16/17 0550 04/17/17 0452 04/18/17 0505 04/19/17 0545 04/21/17 0500  WBC 27.6* 33.4* 30.8* 27.9* 18.8* 13.0*  NEUTROABS 24.8*  --  28.0* 25.7* 15.0*  --   HGB 7.4* 7.8* 7.6* 7.4* 7.5* 7.5*  HCT 22.4* 23.2* 23.1* 22.2* 23.1* 23.1*  MCV 90.7 92.2 92.8 94.3 96.4 97.4  PLT 42* 56* 42* 47* 53* 58*   BNP (last 3 results)  Recent Labs  04/08/17 0139  BNP 2,012.0*     CBG:  Recent Labs Lab 04/20/17 1143 04/20/17 1640 04/21/17 0011 04/21/17 0740 04/21/17 1220  GLUCAP 122* 140* 103* 122* 114*    Recent Results (from the past 240 hour(s))  Culture, respiratory (NON-Expectorated)  Status: None   Collection Time: 04/12/17 12:00 PM  Result Value Ref Range Status   Specimen Description TRACHEAL ASPIRATE  Final   Special Requests NONE  Final   Gram Stain   Final    MODERATE WBC PRESENT,BOTH PMN AND MONONUCLEAR MODERATE YEAST Performed at [facility], [place] [address], [place], [place] [address]    Culture MODERATE CANDIDA TROPICALIS  Final   Report Status 04/14/2017 FINAL  Final  Culture, respiratory (NON-Expectorated)     Status: None   Collection Time: 04/15/17  7:48 AM  Result Value Ref Range Status   Specimen Description TRACHEAL ASPIRATE  Final   Special Requests Normal  Final   Gram Stain   Final    RARE WBC PRESENT, PREDOMINANTLY PMN FEW BUDDING YEAST SEEN Performed at [facility], [place] [address] [place],  [place] [address]    Culture ABUNDANT CANDIDA TROPICALIS  Final   Report Status 04/18/2017 FINAL  Final  CULTURE, BLOOD (ROUTINE X 2) w Reflex to ID Panel     Status: None   Collection Time: 04/15/17 10:46 AM  Result Value Ref Range Status   Specimen Description BLOOD LEFT SUBCLAVIAN TRIPLE LUMEN  Final   Special Requests   Final    BOTTLES DRAWN AEROBIC AND ANAEROBIC Blood Culture results may not be optimal due to an excessive volume of blood received in culture bottles   Culture NO GROWTH 5 DAYS  Final   Report Status 04/20/2017 FINAL  Final  CULTURE, BLOOD (ROUTINE X 2) w Reflex to ID Panel     Status: None   Collection Time: 04/16/17  1:21 PM  Result Value Ref Range Status   Specimen Description BLOOD BLOOD LEFT HAND  Final   Special Requests   Final    BOTTLES DRAWN AEROBIC AND ANAEROBIC Blood Culture results may not be optimal due to an inadequate volume of blood received in culture bottles   Culture NO GROWTH 5 DAYS  Final   Report Status 04/21/2017 FINAL  Final  CULTURE, BLOOD (ROUTINE X 2) w Reflex to ID Panel     Status: None   Collection Time: 04/16/17  1:27 PM  Result Value Ref Range Status   Specimen Description BLOOD A-LINE DRAW  Final   Special Requests   Final    BOTTLES DRAWN AEROBIC AND ANAEROBIC Blood Culture adequate volume   Culture NO GROWTH 5 DAYS  Final   Report Status 04/21/2017 FINAL  Final  C difficile quick scan w PCR reflex     Status: None   Collection Time: 04/16/17 10:15 PM  Result Value Ref Range Status   C Diff antigen NEGATIVE NEGATIVE Final   C Diff toxin NEGATIVE NEGATIVE Final   C Diff interpretation No C. difficile detected.  Final    Comment: VALID     Studies: Dg Chest Port 1 View  Result Date: 04/21/2017 CLINICAL DATA:  Respiratory failure.  Followup exam. EXAM: PORTABLE CHEST 1 VIEW COMPARISON:  04/19/2017 FINDINGS: Has been mild improvement in lung aeration since the previous exam with improved lung  opacity most evident in the mid lungs. Persistent lower lung zone opacity obscures hemidiaphragms, greater on the right, likely combination of pleural effusions with atelectasis, pneumonia or a combination. There is no evidence of pulmonary edema. No pneumothorax. New enteric feeding tube passes below the diaphragm and below the included field of view. Bilateral internal jugular central venous lines are stable and well positioned. IMPRESSION: 1. Mild improvement in lung  aeration since the previous exam. 2. New enteric feeding tube passes below the diaphragm, not completely visualized on this exam. Electronically Signed   By: Lajean Manes M.D.   On: 04/21/2017 08:05    Scheduled Meds: . amiodarone  400 mg Per Tube BID  . feeding supplement (ENSURE ENLIVE)  237 mL Oral TID BM  . [START ON 04/22/2017] levothyroxine  75 mcg Per Tube QAC breakfast  . mouth rinse  15 mL Mouth Rinse BID  . silver sulfADIAZINE   Topical BID  . traZODone  50 mg Per Tube QHS   Continuous Infusions: . feeding supplement (VITAL AF 1.2 CAL) 1,000 mL (04/21/17 0800)  . norepinephrine (LEVOPHED) Adult infusion Stopped (04/21/17 0045)  . pureflow 3 each (04/21/17 0745)    Assessment/Plan:  1. Septic shock with multiorgan failure. Overall prognosis is poor. Patient currently a DO NOT RESUSCITATE 2. PEA Cardiopulmonary arrest.  3. Shock liver from cardiopulmonary arrest. Liver function trending better. Total bilirubin the slowest to respond still up at 4.0. 4. 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 Patient Not making any urine. 5. Acute encephalopathy. Patient now with delirium. Trazodone started for sleep at night 6. Acute respiratory failure status post extubation and on nasal cannula 7. Hypomagnesemia, hypophosphatemia and hypokalemia. Phosphorus level low today. On electrolyte protocol 8. Atrial fibrillation on amiodarone switched to tube instead of IV. Pradaxa on hold.  9. Coagulopathy. Could be  secondary to shock liver. 10. Diarrhea. resolved  11. Cardiomyopathy on echocardiogram. 12. Anemia and thrombocytopenia. Continue to monitor closely  13. Hypothyroidism unspecified on levothyroxine 14. Depression on Zoloft 15. Nutritional status. Patient with very poor oral intake and unable to keep up with her nutritional needs. Continue Dobbhoff tube feeding.  Code Status:     Code Status Orders        Start     Ordered   04/08/17 0651  Full code  Continuous     04/08/17 0650    Code Status History    Date Active Date Inactive Code Status Order ID Comments User Context   This patient has a current code status but no historical code status.     Family Communication: Spoke with Husband at bedside Disposition Plan: To be determined based on clinical course  Consultants:  Critical care specialist  Infectious disease  Nephrology  Palliative care  Cardiology  Antibiotics:  Stopped  Time spent: 23 minutes.   Loletha Grayer  [employer]

## 2017-04-21 NOTE — Progress Notes (Signed)
[facility]  ROUNDING NOTE   Subjective:   Patient remains critically ill.  Continued on CRRT UF at currently 200 cc an hour; net uf 4600 cc last 24 hrs Breathing is little better   Objective:  Vital signs in last 24 hours:  Temp:  [97 F (36.1 C)-98.4 F (36.9 C)] 97.9 F (36.6 C) (07/14 1305) Pulse Rate:  [100-120] 114 (07/14 1305) Resp:  [9-26] 14 (07/14 1305) BP: (74-107)/(33-83) 103/72 (07/14 1305) SpO2:  [94 %-100 %] 100 % (07/14 1305) Weight:  [85.1 kg (187 lb 9.8 oz)] 85.1 kg (187 lb 9.8 oz) (07/14 0500)  Weight change: -2.6 kg (-5 lb 11.7 oz) Filed Weights   04/19/17 0430 04/20/17 0413 04/21/17 0500  Weight: 89 kg (196 lb 3.4 oz) 87.7 kg (193 lb 5.5 oz) 85.1 kg (187 lb 9.8 oz)    Intake/Output: I/O last 3 completed shifts: In: 2968.4 [I.V.:866.8; NG/GT:1841.6; IV Piggyback:260] Out: 7564 [PPIRJ:1884]   Intake/Output this shift:  Total I/O In: 576.8 [P.O.:120; I.V.:66.8; NG/GT:390] Out: 620 [Other:620]  Physical Exam: General: Critically ill  Head: Normocephalic, atraumatic  Eyes: + jaundice  Neck: No masses   Lungs:  Mild crackles at bases  Heart: Irregular, tachycardia  Abdomen:  Soft, nontender, obese  Extremities: Anasarca with massive lower extremity edema.    Neurologic: Alert, able to follow commands  Skin: Bullous lesions with serous anginous drainage  Access: Right IJ temp cath 7/12 [facility])    Basic Metabolic Panel:  Recent Labs Lab 04/19/17 1412 04/19/17 2124 04/20/17 0553 04/20/17 1401 04/21/17 0025 04/21/17 0500  NA 138 137 139 139 137 137  K 4.6 4.6 4.4 4.3 4.2 4.5  CL 106 103 102 103 103 102  CO2 21* 24 27 27 28 27   GLUCOSE 84 111* 144* 127* 105* 112*  BUN 19 19 18 19 19 20   CREATININE 0.52 0.56 0.52 0.56 0.48 0.65  CALCIUM 8.7* 9.2 9.1 9.2 8.8* 8.9  MG 2.3 2.1 2.0 2.0 1.8 1.8  PHOS 2.3* 2.0* 2.8 2.6 1.6*  --     Liver Function Tests:  Recent Labs Lab 04/15/17 0500  04/16/17 0550   04/17/17 0452  04/18/17 0505  04/19/17 0544  04/19/17 2124 04/20/17 0553 04/20/17 1401 04/21/17 0025 04/21/17 0500  AST 469*  --  219*  --  133*  --  95*  --  83*  --   --   --   --   --  67*  ALT 1,172*  --  752*  --  567*  --  402*  --  298*  --   --   --   --   --  190*  ALKPHOS 606*  --  608*  --  598*  --  546*  --   --   --   --   --   --   --  462*  BILITOT 5.4*  --  4.3*  --  4.7*  --  5.9*  --   --   --   --   --   --   --  4.0*  PROT 4.9*  --  4.7*  --  5.5*  --  6.2*  --   --   --   --   --   --   --  6.2*  ALBUMIN 2.8*  < > 2.5*  < > 3.1*  < > 3.7  < > 4.3  < > 4.0 3.6 3.9 3.5 3.5  < > =  values in this interval not displayed. No results for input(s): LIPASE, AMYLASE in the last 168 hours. No results for input(s): AMMONIA in the last 168 hours.  CBC:  Recent Labs Lab 04/15/17 1352 04/16/17 0550 04/17/17 0452 04/18/17 0505 04/19/17 0545 04/21/17 0500  WBC 27.6* 33.4* 30.8* 27.9* 18.8* 13.0*  NEUTROABS 24.8*  --  28.0* 25.7* 15.0*  --   HGB 7.4* 7.8* 7.6* 7.4* 7.5* 7.5*  HCT 22.4* 23.2* 23.1* 22.2* 23.1* 23.1*  MCV 90.7 92.2 92.8 94.3 96.4 97.4  PLT 42* 56* 42* 47* 53* 58*    Cardiac Enzymes: No results for input(s): CKTOTAL, CKMB, CKMBINDEX, TROPONINI in the last 168 hours.  BNP: Invalid input(s): POCBNP  CBG:  Recent Labs Lab 04/20/17 1143 04/20/17 1640 04/21/17 0011 04/21/17 0740 04/21/17 1220  GLUCAP 122* 140* 103* 122* 114*    Microbiology: Results for orders placed or performed during the hospital encounter of 04/08/17  Culture, blood (routine x 2)     Status: None   Collection Time: 04/08/17  2:04 AM  Result Value Ref Range Status   Specimen Description BLOOD RIGHT ANTECUBITAL  Final   Special Requests   Final    BOTTLES DRAWN AEROBIC AND ANAEROBIC Blood Culture adequate volume   Culture NO GROWTH 5 DAYS  Final   Report Status 04/13/2017 FINAL  Final  Culture, blood (routine x 2)     Status: None   Collection Time: 04/08/17  2:06 AM   Result Value Ref Range Status   Specimen Description BLOOD LEFT ANTECUBITAL  Final   Special Requests   Final    BOTTLES DRAWN AEROBIC AND ANAEROBIC Blood Culture adequate volume   Culture NO GROWTH 5 DAYS  Final   Report Status 04/13/2017 FINAL  Final  C difficile quick scan w PCR reflex     Status: None   Collection Time: 04/08/17  5:09 AM  Result Value Ref Range Status   C Diff antigen NEGATIVE NEGATIVE Final   C Diff toxin NEGATIVE NEGATIVE Final   C Diff interpretation No C. difficile detected.  Final  MRSA PCR Screening     Status: None   Collection Time: 04/08/17  6:50 AM  Result Value Ref Range Status   MRSA by PCR NEGATIVE NEGATIVE Final    Comment:        The GeneXpert MRSA Assay (FDA approved for NASAL specimens only), is one component of a comprehensive MRSA colonization surveillance program. It is not intended to diagnose MRSA infection nor to guide or monitor treatment for MRSA infections.   Urine culture     Status: None   Collection Time: 04/08/17 12:16 PM  Result Value Ref Range Status   Specimen Description URINE, RANDOM  Final   Special Requests NONE  Final   Culture   Final    NO GROWTH Performed at [facility], [address] [place], [place] [address]    Report Status 04/10/2017 FINAL  Final  Aerobic Culture (superficial specimen)     Status: None   Collection Time: 04/09/17 11:32 AM  Result Value Ref Range Status   Specimen Description SKIN  Final   Special Requests NONE  Final   Gram Stain   Final    RARE WBC PRESENT, PREDOMINANTLY PMN NO SQUAMOUS EPITHELIAL CELLS SEEN NO ORGANISMS SEEN    Culture   Final    NO GROWTH 2 DAYS Performed at [facility], [place] [address] [place], [place] [address]    Report  Status 04/11/2017 FINAL  Final  Gastrointestinal Panel by PCR , Stool     Status: None   Collection Time: 04/09/17  5:10 PM  Result Value Ref Range Status   Campylobacter species NOT DETECTED NOT DETECTED Final    Plesimonas shigelloides NOT DETECTED NOT DETECTED Final   Salmonella species NOT DETECTED NOT DETECTED Final   Yersinia enterocolitica NOT DETECTED NOT DETECTED Final   Vibrio species NOT DETECTED NOT DETECTED Final   Vibrio cholerae NOT DETECTED NOT DETECTED Final   Enteroaggregative E coli (EAEC) NOT DETECTED NOT DETECTED Final   Enteropathogenic E coli (EPEC) NOT DETECTED NOT DETECTED Final   Enterotoxigenic E coli (ETEC) NOT DETECTED NOT DETECTED Final   Shiga like toxin producing E coli (STEC) NOT DETECTED NOT DETECTED Final   Shigella/Enteroinvasive E coli (EIEC) NOT DETECTED NOT DETECTED Final   Cryptosporidium NOT DETECTED NOT DETECTED Final   Cyclospora cayetanensis NOT DETECTED NOT DETECTED Final   Entamoeba histolytica NOT DETECTED NOT DETECTED Final   Giardia lamblia NOT DETECTED NOT DETECTED Final   Adenovirus F40/41 NOT DETECTED NOT DETECTED Final   Astrovirus NOT DETECTED NOT DETECTED Final   Norovirus GI/GII NOT DETECTED NOT DETECTED Final   Rotavirus A NOT DETECTED NOT DETECTED Final   Sapovirus (I, II, IV, and V) NOT DETECTED NOT DETECTED Final  Culture, respiratory (NON-Expectorated)     Status: None   Collection Time: 04/12/17 12:00 PM  Result Value Ref Range Status   Specimen Description TRACHEAL ASPIRATE  Final   Special Requests NONE  Final   Gram Stain   Final    MODERATE WBC PRESENT,BOTH PMN AND MONONUCLEAR MODERATE YEAST Performed at [facility], [address] [place], [place] [address]    Culture MODERATE CANDIDA TROPICALIS  Final   Report Status 04/14/2017 FINAL  Final  Culture, respiratory (NON-Expectorated)     Status: None   Collection Time: 04/15/17  7:48 AM  Result Value Ref Range Status   Specimen Description TRACHEAL ASPIRATE  Final   Special Requests Normal  Final   Gram Stain   Final    RARE WBC PRESENT, PREDOMINANTLY PMN FEW BUDDING YEAST SEEN Performed at [facility], [place] [address], [place], [place] [address]    Culture  ABUNDANT CANDIDA TROPICALIS  Final   Report Status 04/18/2017 FINAL  Final  CULTURE, BLOOD (ROUTINE X 2) w Reflex to ID Panel     Status: None   Collection Time: 04/15/17 10:46 AM  Result Value Ref Range Status   Specimen Description BLOOD LEFT SUBCLAVIAN TRIPLE LUMEN  Final   Special Requests   Final    BOTTLES DRAWN AEROBIC AND ANAEROBIC Blood Culture results may not be optimal due to an excessive volume of blood received in culture bottles   Culture NO GROWTH 5 DAYS  Final   Report Status 04/20/2017 FINAL  Final  CULTURE, BLOOD (ROUTINE X 2) w Reflex to ID Panel     Status: None   Collection Time: 04/16/17  1:21 PM  Result Value Ref Range Status   Specimen Description BLOOD BLOOD LEFT HAND  Final   Special Requests   Final    BOTTLES DRAWN AEROBIC AND ANAEROBIC Blood Culture results may not be optimal due to an inadequate volume of blood received in culture bottles   Culture NO GROWTH 5 DAYS  Final   Report Status 04/21/2017 FINAL  Final  CULTURE, BLOOD (ROUTINE X 2) w Reflex to ID Panel  Status: None   Collection Time: 04/16/17  1:27 PM  Result Value Ref Range Status   Specimen Description BLOOD A-LINE DRAW  Final   Special Requests   Final    BOTTLES DRAWN AEROBIC AND ANAEROBIC Blood Culture adequate volume   Culture NO GROWTH 5 DAYS  Final   Report Status 04/21/2017 FINAL  Final  C difficile quick scan w PCR reflex     Status: None   Collection Time: 04/16/17 10:15 PM  Result Value Ref Range Status   C Diff antigen NEGATIVE NEGATIVE Final   C Diff toxin NEGATIVE NEGATIVE Final   C Diff interpretation No C. difficile detected.  Final    Comment: VALID    Coagulation Studies: No results for input(s): LABPROT, INR in the last 72 hours.  Urinalysis: No results for input(s): COLORURINE, LABSPEC, PHURINE, GLUCOSEU, HGBUR, BILIRUBINUR, KETONESUR, PROTEINUR, UROBILINOGEN, NITRITE, LEUKOCYTESUR in the last 72 hours.  Invalid input(s): APPERANCEUR    Imaging: Dg Chest Port  1 View  Result Date: 04/21/2017 CLINICAL DATA:  Respiratory failure.  Followup exam. EXAM: PORTABLE CHEST 1 VIEW COMPARISON:  04/19/2017 FINDINGS: Has been mild improvement in lung aeration since the previous exam with improved lung opacity most evident in the mid lungs. Persistent lower lung zone opacity obscures hemidiaphragms, greater on the right, likely combination of pleural effusions with atelectasis, pneumonia or a combination. There is no evidence of pulmonary edema. No pneumothorax. New enteric feeding tube passes below the diaphragm and below the included field of view. Bilateral internal jugular central venous lines are stable and well positioned. IMPRESSION: 1. Mild improvement in lung aeration since the previous exam. 2. New enteric feeding tube passes below the diaphragm, not completely visualized on this exam. Electronically Signed   By: Lajean Manes M.D.   On: 04/21/2017 08:05   Dg Abd Portable 1v  Result Date: 04/19/2017 CLINICAL DATA:  Feeding tube placement EXAM: PORTABLE ABDOMEN - 1 VIEW COMPARISON:  04/11/2017 FINDINGS: Feeding tube is in place with the tip in the mid stomach. Right groin dialysis catheter in place with the tip overlying the L3 vertebral body. Nonobstructive bowel gas pattern. IMPRESSION: Feeding tube tip in the mid stomach Electronically Signed   By: Rolm Baptise M.D.   On: 04/19/2017 16:02     Medications:   . feeding supplement (VITAL AF 1.2 CAL) 1,000 mL (04/21/17 0800)  . norepinephrine (LEVOPHED) Adult infusion Stopped (04/21/17 0045)  . pureflow 3 each (04/21/17 0745)   . amiodarone  400 mg Per Tube BID  . feeding supplement (ENSURE ENLIVE)  237 mL Oral TID BM  . [START ON 04/22/2017] levothyroxine  75 mcg Per Tube QAC breakfast  . mouth rinse  15 mL Mouth Rinse BID  . silver sulfADIAZINE   Topical BID  . traZODone  50 mg Per Tube QHS   artificial tears, heparin, ipratropium-albuterol, LORazepam, metoprolol tartrate, morphine injection,  [DISCONTINUED] ondansetron **OR** ondansetron (ZOFRAN) IV, phenol, sodium chloride flush, zinc oxide  Assessment/ Plan:  Ms. Hania Cerone is a 66 y.o. white female  with hypertension, hypothyroidism, atrial fibrillation, who was admitted to [facility] on 04/08/2017 for evaluation of increasing lower extremity edema.  Hospital course: Admitted on 04/08/2017 with significant peripheral edema and cellulitis. Started on CRRT for hypotension and anuric renal failure on 7/2. Patient weaned off CRRT on 7/4. However later that day, patient had cardiac arrest and coded. Intubated, sedated and restarted on three vasopressors. Restart on CRRT on 7/4.  2-D echo which shows EF 25-30%  1. Acute renal failure with proteinuria and hematuria: baseline creatinine of 0.62 09/2014.  Patient with massive peripheral edema, anasarca and hypoalbuminemia.  Recent IV contrast exposure on 04/04/17.  - Serologic work up: compliments normal, negative SPEP/UPEP, ANA negative, ANCA negative, GBM negative, negative hepatitis - Continue CVVHD. Continue UF at 200 cc / hr - neg 4600 cc last 24 hrs - Hold further iv Albumin - Breathing appears to have improved  2. 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 Echocardiogram with EF of 25-30% - fluid removal with CRRT as tolerated   3. Acute respiratory failure Extubated 7/9 Acute pulm edema - increase UF as above  4. 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 Hemoglobin A1c 6.2% - continue glucose control.    LOS: 13 Haeleigh Streiff 7/14/20181:15 PM

## 2017-04-21 NOTE — Progress Notes (Signed)
PULMONARY / CRITICAL CARE MEDICINE   Name: [name] MRN: [id number] DOB: 1950-11-29    ADMISSION DATE:  04/08/2017  PT PROFILE:   66 y.o. F with PMH of CAF, hypothyroidism admitted via ED to ICU with many days of progressive LE edema, blistering lesions on BLE, progressive weakness. Adm diagnosis of septic shock, cellulitis, AKI.  Pt PEA arrested 07/4 ACLS protocol initiated ROSC within 2 minutes following interventions intubated during cardiac arrest and extubated 07/9.   MAJOR EVENTS/TEST RESULTS: 07/01 LE venous US: no DVT 07/01 Echocardiogram:  LVEF 25-30% 07/01 Nephrology consultation 07/02 ID consultation 07/02 CRRT initiated 07/03 Off vasopressors. Worsening tachycardia - amiodarone initiated 07/04 Pt PEA arrest ACLS protocol initiated ROSC 2 minutes now back on vasopressors  07/05 Intubated, shock req high dose vasopressors, on CRRT, elevated LFTs c/w congestion/shock liver, pulmonary edema vs ARDS on CXR 07/09 Pt extubated  07/11 remains on CRRT and vasopressors, resp status stable 07/12 remains on CRRT, on amiodarone. Dr Mortimer Fries addressed goals of care with pt's husband. Made DNR/DNI, continue current management without significant escalation. NGT placed for nutrition 07/13 off vasopressors, cont on CRRT with increased UF to 200/hr.  07/14 PCCM/Nephrology agree to continue CRRT through [facility].  INDWELLING DEVICES:: R fem HD cath 07/02 >> out ETT 07/04 >>07/09 Left IJ 07/04 >> out R IJ HD cath 07/12 >>   MICRO DATA: MRSA PCR 07/01 >> NEG Urine 07/01 >> NEG Blood 07/01 >> NEG C diff 07/01 >> NEG GI panel 07/02 >> NEG Resp 07/05 >> moderate candida tropicalis  Blood 07/08 >> NEG Blood 07/09 >> NEG Resp 07/08 >> abundant candida tropicalis Cdiff 07/09 >> NEG   ANTIMICROBIALS:  Vanc 07/01 >> 07/02 Pip-tazo 07/01 >> 7/10 Vancomycin 07/6 >>7/10   SUBJECTIVE:  No distress. Remains mildly confused. No new complaints  VITAL SIGNS: BP 93/68   Pulse (!) 103   Temp 98.1  F (36.7 C)   Resp 17   Ht 5\' 5"  (1.651 m)   Wt 187 lb 9.8 oz (85.1 kg)   SpO2 95%   BMI 31.22 kg/m   VENTILATOR SETTINGS:    INTAKE / OUTPUT: I/O last 3 completed shifts: In: 2968.4 [I.V.:866.8; NG/GT:1841.6; IV Piggyback:260] Out: 1638 [GTXMI:6803]  PHYSICAL EXAMINATION: General: NAD Neuro: RASS 0, no focal deficits HEENT: NCAT, sclericterus Cardiovascular: IRIR, mildly tachycardic, no murmurs noted Lungs: Clear anteriorly  Abdomen: obese, soft, ND Ext: BUE edema improved, symmetric LE edema  LABS:  BMET  Recent Labs Lab 04/20/17 1401 04/21/17 0025 04/21/17 0500  NA 139 137 137  K 4.3 4.2 4.5  CL 103 103 102  CO2 27 28 27   BUN 19 19 20   CREATININE 0.56 0.48 0.65  GLUCOSE 127* 105* 112*    Electrolytes  Recent Labs Lab 04/20/17 0553 04/20/17 1401 04/21/17 0025 04/21/17 0500  CALCIUM 9.1 9.2 8.8* 8.9  MG 2.0 2.0 1.8 1.8  PHOS 2.8 2.6 1.6*  --     CBC  Recent Labs Lab 04/18/17 0505 04/19/17 0545 04/21/17 0500  WBC 27.9* 18.8* 13.0*  HGB 7.4* 7.5* 7.5*  HCT 22.2* 23.1* 23.1*  PLT 47* 53* 58*    Coag's  Recent Labs Lab 04/15/17 1352  04/19/17 0544 04/20/17 0553 04/21/17 0500  APTT  --   < > 33 35 36  INR 1.49  --   --   --   --   < > = values in this interval not displayed.  Sepsis Markers  Recent Labs Lab 04/16/17 1146 04/17/17  0452 04/18/17 0505  PROCALCITON 19.26 25.00 19.49    ABG  Recent Labs Lab 04/16/17 1100 04/19/17 1031  PHART 7.43 7.52*  PCO2ART 42 26*  PO2ART 106 65*    Liver Enzymes  Recent Labs Lab 04/17/17 0452  04/18/17 0505  04/19/17 0544  04/20/17 1401 04/21/17 0025 04/21/17 0500  AST 133*  --  95*  --  83*  --   --   --  67*  ALT 567*  --  402*  --  298*  --   --   --  190*  ALKPHOS 598*  --  546*  --   --   --   --   --  462*  BILITOT 4.7*  --  5.9*  --   --   --   --   --  4.0*  ALBUMIN 3.1*  < > 3.7  < > 4.3  < > 3.9 3.5 3.5  < > = values in this interval not displayed.  Cardiac  Enzymes No results for input(s): TROPONINI, PROBNP in the last 168 hours.  Glucose  Recent Labs Lab 04/20/17 0336 04/20/17 0709 04/20/17 1143 04/20/17 1640 04/21/17 0011 04/21/17 0740  GLUCAP 146* 141* 122* 140* 103* 122*    CXR: Improved edema pattern, right pleural effusion  ASSESSMENT / PLAN: Acute hypoxic respiratory failure Pulmonary edema Severe dilated cardiomyopathy Chronic AF Status post PEA arrest 07/04  Cardiogenic shock, resolved Anuric AKI - likely cardiorenal plus ATN Anasarca  Protein-calorie malnutrition Elevated LFTs due to shock liver, hepatic congestion - improving Diarrhea, resolved Anemia without evidence of acute blood loss Thrombocytopenia Elevated prothrombin time, resolved LE cellulitis - fully treated Severe deconditioning   Continue supplemental oxygen Continue to monitor hemodynamics Change amiodarone to enteral Continue CRRT through weekend. Discussed with Dr. Candiss Norse Monitor BMET intermittently Monitor I/Os Correct electrolytes as indicated DVT px: SCDs Monitor CBC intermittently Transfuse per usual guidelines Continue TF protocol Physical therapy eval requested DNR/DNI Will remain in ICU/SDU as long as on CRRT  Merton Border, MD PCCM service Mobile [phone] Pager [phone] 04/21/2017 12:33 PM

## 2017-04-21 NOTE — Progress Notes (Addendum)
PHARMACY CONSULT NOTE - INITIAL   Pharmacy Consult for Electrolyte Replacement  Indication: CRRT   No Known Allergies  Patient Measurements: Height: 5\' 5"  (165.1 cm) Weight: 187 lb 9.8 oz (85.1 kg) IBW/kg (Calculated) : 57 Vital Signs: Temp: 97.3 F (36.3 C) (07/14 1830) Temp Source: Core (Comment) (07/14 1915) BP: 96/55 (07/14 1830) Pulse Rate: 114 (07/14 1830) Intake/Output from previous day: 07/13 0701 - 07/14 0700 In: 1853.8 [I.V.:467.2; NG/GT:1386.6] Out: 4634  Intake/Output from this shift: No intake/output data recorded.  Labs:  Recent Labs  04/19/17 0544 04/19/17 0545  04/20/17 0553 04/20/17 1401 04/21/17 0025 04/21/17 0500 04/21/17 1314  WBC  --  18.8*  --   --   --   --  13.0*  --   HGB  --  7.5*  --   --   --   --  7.5*  --   HCT  --  23.1*  --   --   --   --  23.1*  --   PLT  --  53*  --   --   --   --  58*  --   APTT 33  --   --  35  --   --  36  --   CREATININE 0.57  --   < > 0.52 0.56 0.48 0.65 0.54  MG 1.9  --   < > 2.0 2.0 1.8 1.8 1.8  PHOS 2.1*  --   < > 2.8 2.6 1.6*  --  1.2*  ALBUMIN 4.3  --   < > 3.6 3.9 3.5 3.5 3.4*  PROT  --   --   --   --   --   --  6.2*  --   AST 83*  --   --   --   --   --  67*  --   ALT 298*  --   --   --   --   --  190*  --   ALKPHOS  --   --   --   --   --   --  462*  --   BILITOT  --   --   --   --   --   --  4.0*  --   < > = values in this interval not displayed.  Lab Results  Component Value Date   K 4.5 04/21/2017    Estimated Creatinine Clearance: 75.5 mL/min (by C-G formula based on SCr of 0.54 mg/dL).   Assessment: 66 yo female receiving CRRT. Pharmacy consulted for electrolyte monitoring.   Goal of Therapy:  Mg: 1.8-2.4mg /dL K: 3.5-5.1 mmol/L Phos: 2.5-4.6mg /dL  Plan:  7/14  K+: 4.4  Phos: <1  Mg: 1.6. Potassium Phos 1mmol already ordered. Patient is able to take some PO medsm will order PHOS- NAK 280mg  x 2 packets x 2 doses.  Add magnesium 2g IV x 1 dose.  No additional replacement is needed  at this time.  Will continue to monitor electrolyte and replace as needed.   Pernell Dupre, PharmD, BCPS Clinical Pharmacist 04/21/2017 8:11 PM    7/15 AM PO4 2.0. 15 mmol sodium phosphate ordered. Recheck PO4 with tomorrow AM labs.  Sim Boast, PharmD, BCPS  04/22/17 5:48 AM

## 2017-04-21 NOTE — Plan of Care (Signed)
Problem: SLP Dysphagia Goals Goal: Misc Dysphagia Goal Pt will safely tolerate po diet of least restrictive consistency w/ no overt s/s of aspiration noted by Staff/pt/family x3 sessions.    

## 2017-04-21 NOTE — Progress Notes (Signed)
PT Cancellation Note  Patient Details Name: [name] MRN: [id number] DOB: 1951/05/30   Cancelled Treatment:    Reason Eval/Treat Not Completed:  (Consult received and chart reviewed.  Per notes, patient remains on CRRT (with plans to discontinue after weekend).  Will hold PT evaluation until CRRT complete and initiate evaluation as medically appropriate.)   Torey Reinard H. Owens Shark, PT, DPT, NCS 04/21/17, 2:01 PM [phone]

## 2017-04-22 DIAGNOSIS — R7989 Other specified abnormal findings of blood chemistry: Secondary | ICD-10-CM

## 2017-04-22 LAB — CBC
HCT: 23 % — ABNORMAL LOW (ref 35.0–47.0)
Hemoglobin: 7.6 g/dL — ABNORMAL LOW (ref 12.0–16.0)
MCH: 32.2 pg (ref 26.0–34.0)
MCHC: 32.9 g/dL (ref 32.0–36.0)
MCV: 97.9 fL (ref 80.0–100.0)
PLATELETS: 59 10*3/uL — AB (ref 150–440)
RBC: 2.35 MIL/uL — ABNORMAL LOW (ref 3.80–5.20)
RDW: 19 % — AB (ref 11.5–14.5)
WBC: 10.8 10*3/uL (ref 3.6–11.0)

## 2017-04-22 LAB — RENAL FUNCTION PANEL
ALBUMIN: 3.9 g/dL (ref 3.5–5.0)
ALBUMIN: 3.7 g/dL (ref 3.5–5.0)
ALBUMIN: 3.8 g/dL (ref 3.5–5.0)
ANION GAP: 8 (ref 5–15)
ANION GAP: 10 (ref 5–15)
Anion gap: 7 (ref 5–15)
BUN: 29 mg/dL — AB (ref 6–20)
BUN: 36 mg/dL — ABNORMAL HIGH (ref 6–20)
BUN: 39 mg/dL — ABNORMAL HIGH (ref 6–20)
CALCIUM: 9 mg/dL (ref 8.9–10.3)
CHLORIDE: 102 mmol/L (ref 101–111)
CO2: 27 mmol/L (ref 22–32)
CO2: 26 mmol/L (ref 22–32)
CO2: 28 mmol/L (ref 22–32)
CREATININE: 0.64 mg/dL (ref 0.44–1.00)
CREATININE: 0.79 mg/dL (ref 0.44–1.00)
Calcium: 8.7 mg/dL — ABNORMAL LOW (ref 8.9–10.3)
Calcium: 8.5 mg/dL — ABNORMAL LOW (ref 8.9–10.3)
Chloride: 103 mmol/L (ref 101–111)
Chloride: 99 mmol/L — ABNORMAL LOW (ref 101–111)
Creatinine, Ser: 0.85 mg/dL (ref 0.44–1.00)
GFR calc Af Amer: >60 mL/min (ref >60)
GFR calc non Af Amer: >60 mL/min (ref >60)
GFR calc non Af Amer: >60 mL/min (ref >60)
GLUCOSE: 110 mg/dL — AB (ref 65–99)
GLUCOSE: 116 mg/dL — AB (ref 65–99)
Glucose, Bld: 132 mg/dL — ABNORMAL HIGH (ref 65–99)
PHOSPHORUS: 2.8 mg/dL (ref 2.5–4.6)
PHOSPHORUS: 2.6 mg/dL (ref 2.5–4.6)
POTASSIUM: 4.9 mmol/L (ref 3.5–5.1)
Phosphorus: 2 mg/dL — ABNORMAL LOW (ref 2.5–4.6)
Potassium: 5.3 mmol/L — ABNORMAL HIGH (ref 3.5–5.1)
Potassium: 4.8 mmol/L (ref 3.5–5.1)
SODIUM: 138 mmol/L (ref 135–145)
Sodium: 135 mmol/L (ref 135–145)
Sodium: 137 mmol/L (ref 135–145)

## 2017-04-22 LAB — GLUCOSE, CAPILLARY
Glucose-Capillary: 121 mg/dL — ABNORMAL HIGH (ref 65–99)
Glucose-Capillary: 124 mg/dL — ABNORMAL HIGH (ref 65–99)
Glucose-Capillary: 86 mg/dL (ref 65–99)

## 2017-04-22 LAB — MAGNESIUM
MAGNESIUM: 1.9 mg/dL (ref 1.7–2.4)
Magnesium: 2.2 mg/dL (ref 1.7–2.4)
Magnesium: 1.8 mg/dL (ref 1.7–2.4)

## 2017-04-22 LAB — APTT: APTT: 32 s (ref 24–36)

## 2017-04-22 LAB — TSH: TSH: 9.44 u[IU]/mL — ABNORMAL HIGH (ref 0.350–4.500)

## 2017-04-22 MED ORDER — NOREPINEPHRINE BITARTRATE 1 MG/ML IV SOLN
0.0000 ug/min | INTRAVENOUS | Status: DC
  Administered 2017-04-23: 9 ug/min via INTRAVENOUS
  Administered 2017-04-24: 6 ug/min via INTRAVENOUS
  Administered 2017-04-26: 9 ug/min via INTRAVENOUS
  Administered 2017-04-28: 4 ug/min via INTRAVENOUS
  Filled 2017-04-22 (×5): qty 16

## 2017-04-22 MED ORDER — NOREPINEPHRINE BITARTRATE 1 MG/ML IV SOLN
0.0000 ug/min | INTRAVENOUS | Status: DC
  Administered 2017-04-22: 4 ug/min via INTRAVENOUS
  Filled 2017-04-22: qty 4

## 2017-04-22 MED ORDER — QUETIAPINE FUMARATE 25 MG PO TABS
50.0000 mg | ORAL_TABLET | Freq: Every day | ORAL | Status: DC
  Filled 2017-04-22: qty 2

## 2017-04-22 MED ORDER — PUREFLOW DIALYSIS SOLUTION
INTRAVENOUS | Status: DC
  Administered 2017-04-22 – 2017-04-23 (×3): 3 via INTRAVENOUS_CENTRAL
  Administered 2017-04-23 – 2017-04-24 (×3): via INTRAVENOUS_CENTRAL

## 2017-04-22 MED ORDER — SODIUM PHOSPHATES 45 MMOLE/15ML IV SOLN
15.0000 mmol | Freq: Once | INTRAVENOUS | Status: AC
  Administered 2017-04-22: 15 mmol via INTRAVENOUS
  Filled 2017-04-22: qty 5

## 2017-04-22 MED ORDER — ALBUMIN HUMAN 25 % IV SOLN
12.5000 g | Freq: Three times a day (TID) | INTRAVENOUS | Status: DC
  Administered 2017-04-22 – 2017-04-25 (×10): 12.5 g via INTRAVENOUS
  Filled 2017-04-22 (×15): qty 50

## 2017-04-22 MED ORDER — QUETIAPINE FUMARATE 25 MG PO TABS
25.0000 mg | ORAL_TABLET | Freq: Every morning | ORAL | Status: DC
  Administered 2017-04-22: 25 mg
  Filled 2017-04-22: qty 1

## 2017-04-22 MED ORDER — QUETIAPINE FUMARATE 25 MG PO TABS
25.0000 mg | ORAL_TABLET | Freq: Every day | ORAL | Status: DC
  Administered 2017-04-22 – 2017-04-24 (×4): 25 mg via ORAL
  Filled 2017-04-22 (×3): qty 1

## 2017-04-22 NOTE — Progress Notes (Signed)
PULMONARY / CRITICAL CARE MEDICINE   Name: [name] MRN: [id number] DOB: May 15, 1951    ADMISSION DATE:  04/08/2017  PT PROFILE:   66 y.o. F with PMH of CAF, hypothyroidism admitted via ED to ICU with many days of progressive LE edema, blistering lesions on BLE, progressive weakness. Adm diagnosis of septic shock, cellulitis, AKI.  Pt PEA arrested 07/4 ACLS protocol initiated ROSC within 2 minutes following interventions intubated during cardiac arrest and extubated 07/9.   MAJOR EVENTS/TEST RESULTS: 07/01 LE venous US: no DVT 07/01 Echocardiogram:  LVEF 25-30% 07/01 Nephrology consultation 07/02 ID consultation 07/02 CRRT initiated 07/03 Off vasopressors. Worsening tachycardia - amiodarone initiated 07/04 Pt PEA arrest ACLS protocol initiated ROSC 2 minutes now back on vasopressors  07/05 Intubated, shock req high dose vasopressors, on CRRT, elevated LFTs c/w congestion/shock liver, pulmonary edema vs ARDS on CXR 07/09 Pt extubated  07/11 remains on CRRT and vasopressors, resp status stable 07/12 remains on CRRT, on amiodarone. Dr Mortimer Fries addressed goals of care with pt's husband. Made DNR/DNI, continue current management without significant escalation. NGT placed for nutrition 07/13 off vasopressors, cont on CRRT with increased UF to 200/hr.  07/14 PCCM/Nephrology agree to continue CRRT through [facility]. 07/15 Increased confusion and intermittent agitation. Quetiapine initiated  INDWELLING DEVICES:: R fem HD cath 07/02 >> out ETT 07/04 >>07/09 Left IJ 07/04 >> out R IJ HD cath 07/12 >>   MICRO DATA: MRSA PCR 07/01 >> NEG Urine 07/01 >> NEG Blood 07/01 >> NEG C diff 07/01 >> NEG GI panel 07/02 >> NEG Resp 07/05 >> moderate candida tropicalis  Blood 07/08 >> NEG Blood 07/09 >> NEG Resp 07/08 >> abundant candida tropicalis Cdiff 07/09 >> NEG   ANTIMICROBIALS:  Vanc 07/01 >> 07/02 Pip-tazo 07/01 >> 7/10 Vancomycin 07/6 >>7/10   SUBJECTIVE:  No distress. Remains confused and  poorly oriented. No new complaints  VITAL SIGNS: BP (!) 65/55   Pulse (!) 110   Temp (!) 96.8 F (36 C)   Resp 14   Ht 5\' 5"  (1.651 m)   Wt 178 lb 12.7 oz (81.1 kg)   SpO2 98%   BMI 29.75 kg/m   VENTILATOR SETTINGS:    INTAKE / OUTPUT: I/O last 3 completed shifts: In: 3777.2 [P.O.:120; I.V.:267.2; NG/GT:2340; IV Piggyback:1050] Out: 6890 [Other:6890]  PHYSICAL EXAMINATION: General: NAD Neuro: RASS 0, no focal deficits HEENT: NCAT, sclericterus Cardiovascular: IRIR, no murmurs noted Lungs: Clear anteriorly  Abdomen: obese, soft, ND Ext: improved BUE and BLE edema  LABS:  BMET  Recent Labs Lab 04/21/17 1314 04/21/17 1938 04/22/17 0430  NA 135 136 138  K 4.5 4.4 5.3*  CL 102 104 103  CO2 28 26 27   BUN 23* 23* 29*  CREATININE 0.54 0.56 0.64  GLUCOSE 117* 112* 110*    Electrolytes  Recent Labs Lab 04/21/17 1314 04/21/17 1938 04/22/17 0429 04/22/17 0430  CALCIUM 8.6* 8.6*  --  9.0  MG 1.8 1.6* 2.2  --   PHOS 1.2* <1.0*  --  2.0*    CBC  Recent Labs Lab 04/19/17 0545 04/21/17 0500 04/22/17 0429  WBC 18.8* 13.0* 10.8  HGB 7.5* 7.5* 7.6*  HCT 23.1* 23.1* 23.0*  PLT 53* 58* 59*    Coag's  Recent Labs Lab 04/15/17 1352  04/20/17 0553 04/21/17 0500 04/22/17 0429  APTT  --   < > 35 36 32  INR 1.49  --   --   --   --   < > = values in  this interval not displayed.  Sepsis Markers  Recent Labs Lab 04/16/17 1146 04/17/17 0452 04/18/17 0505  PROCALCITON 19.26 25.00 19.49    ABG  Recent Labs Lab 04/16/17 1100 04/19/17 1031  PHART 7.43 7.52*  PCO2ART 42 26*  PO2ART 106 65*    Liver Enzymes  Recent Labs Lab 04/17/17 0452  04/18/17 0505  04/19/17 0544  04/21/17 0500 04/21/17 1314 04/21/17 1938 04/22/17 0430  AST 133*  --  95*  --  83*  --  67*  --   --   --   ALT 567*  --  402*  --  298*  --  190*  --   --   --   ALKPHOS 598*  --  546*  --   --   --  462*  --   --   --   BILITOT 4.7*  --  5.9*  --   --   --  4.0*  --    --   --   ALBUMIN 3.1*  < > 3.7  < > 4.3  < > 3.5 3.4* 3.5 3.9  < > = values in this interval not displayed.  Cardiac Enzymes No results for input(s): TROPONINI, PROBNP in the last 168 hours.  Glucose  Recent Labs Lab 04/21/17 0011 04/21/17 0740 04/21/17 1220 04/21/17 1604 04/21/17 2352 04/22/17 1148  GLUCAP 103* 122* 114* 112* 136* 124*    CXR: NNF  ASSESSMENT / PLAN: Acute hypoxic respiratory failure Pulmonary edema Severe dilated cardiomyopathy Chronic AF Status post PEA arrest 07/04  Cardiogenic shock, resolved Anuric AKI - likely cardiorenal plus ATN Anasarca, improving Protein-calorie malnutrition Elevated LFTs due to shock liver, hepatic congestion - improving Diarrhea, resolved Anemia without evidence of acute blood loss Thrombocytopenia Elevated prothrombin time, resolved LE cellulitis - fully treated Severe deconditioning   Continue supplemental oxygen Continue amiodarone - changed to to enteral 07/14 Continue CRRT through weekend Monitor BMET intermittently Monitor I/Os Correct electrolytes as indicated DVT px: SCDs Monitor CBC intermittently Transfuse per usual guidelines Continue TF protocol Physical therapy eval requested 07/14 DNR/DNI  Will remain in ICU/SDU as long as on CRRT Husband updated @ bedside  Merton Border, MD PCCM service Mobile [phone] Pager [phone] 04/22/2017 12:58 PM

## 2017-04-22 NOTE — Progress Notes (Signed)
Additional albumin in. Still low bp.  Discussed with Dr Candiss Norse. [facility] Dr Tamala Julian called for vasopressor. Levophed gtt odered and atarted.  One SBP greater than 100, Dr Candiss Norse would like CRRT restarted.

## 2017-04-22 NOTE — Progress Notes (Signed)
Patient ID: [name], female   DOB: 1951/04/05, 66 y.o.   MRN: [id number]     [facility] PROGRESS NOTE  [name] TTS:[id number] DOB: 1950/12/26 DOA: 04/08/2017 PCP: Tracie Harrier, MD  HPI/Subjective: Patient given a dose of Seroquel secondary to not sleeping. CRRT needed to be stopped secondary to hypotension. Patient lethargic. Answered very few yes or no questions. Was able to follow a few simple commands.  Objective: Vitals:   04/22/17 1227 04/22/17 1230  BP: (!) 62/41 (!) 65/55  Pulse: (!) 104 (!) 110  Resp: 13 14  Temp: (!) 96.8 F (36 C) (!) 96.8 F (36 C)    Filed Weights   04/20/17 0413 04/21/17 0500 04/22/17 0400  Weight: 87.7 kg (193 lb 5.5 oz) 85.1 kg (187 lb 9.8 oz) 81.1 kg (178 lb 12.7 oz)    ROS: Review of Systems  Unable to perform ROS: Mental acuity  Respiratory: Negative for shortness of breath.   Cardiovascular: Negative for chest pain.  Gastrointestinal: Negative for abdominal pain.    Exam: Physical Exam  Constitutional: She appears lethargic.  HENT:  Head: Atraumatic.  Nose: No mucosal edema.  Mouth/Throat: No oropharyngeal exudate or posterior oropharyngeal edema.  Eyes: Pupils are equal, round, and reactive to light. Lids are normal.  Icteric sclera  Neck: Carotid bruit is not present. No thyromegaly present.  Cardiovascular: Regular rhythm, S1 normal, S2 normal and normal heart sounds.   Respiratory: She has decreased breath sounds in the right lower field and the left lower field. She has no wheezes. She has rhonchi in the left lower field. She has no rales.  GI: Soft. Bowel sounds are normal. There is no tenderness.  Musculoskeletal:       Right ankle: She exhibits swelling.       Left ankle: She exhibits swelling.  Lymphadenopathy:    She has no cervical adenopathy.  Neurological: She appears lethargic.  Able to wiggle toes bilateral feet. Able to squeeze hands.  Skin: Skin is warm. No cyanosis.  Left lower extremity  covered today Right foot with some blisters that are healing Bruising right neck and left thigh  Psychiatric: Her affect is blunt.      Data Reviewed: Basic Metabolic Panel:  Recent Labs Lab 04/20/17 1401 04/21/17 0025 04/21/17 0500 04/21/17 1314 04/21/17 1938 04/22/17 0429 04/22/17 0430  NA 139 137 137 135 136  --  138  K 4.3 4.2 4.5 4.5 4.4  --  5.3*  CL 103 103 102 102 104  --  103  CO2 27 28 27 28 26   --  27  GLUCOSE 127* 105* 112* 117* 112*  --  110*  BUN 19 19 20  23* 23*  --  29*  CREATININE 0.56 0.48 0.65 0.54 0.56  --  0.64  CALCIUM 9.2 8.8* 8.9 8.6* 8.6*  --  9.0  MG 2.0 1.8 1.8 1.8 1.6* 2.2  --   PHOS 2.6 1.6*  --  1.2* <1.0*  --  2.0*   Liver Function Tests: CBC:  Recent Labs Lab 04/15/17 1352  04/17/17 0452 04/18/17 0505 04/19/17 0545 04/21/17 0500 04/22/17 0429  WBC 27.6*  < > 30.8* 27.9* 18.8* 13.0* 10.8  NEUTROABS 24.8*  --  28.0* 25.7* 15.0*  --   --   HGB 7.4*  < > 7.6* 7.4* 7.5* 7.5* 7.6*  HCT 22.4*  < > 23.1* 22.2* 23.1* 23.1* 23.0*  MCV 90.7  < > 92.8 94.3 96.4 97.4 97.9  PLT 42*  < >  42* 47* 53* 58* 59*  < > = values in this interval not displayed. BNP (last 3 results)  Recent Labs  04/08/17 0139  BNP 2,012.0*     CBG:  Recent Labs Lab 04/21/17 0740 04/21/17 1220 04/21/17 1604 04/21/17 2352 04/22/17 1148  GLUCAP 122* 114* 112* 136* 124*    Recent Results (from the past 240 hour(s))  Culture, respiratory (NON-Expectorated)     Status: None   Collection Time: 04/15/17  7:48 AM  Result Value Ref Range Status   Specimen Description TRACHEAL ASPIRATE  Final   Special Requests Normal  Final   Gram Stain   Final    RARE WBC PRESENT, PREDOMINANTLY PMN FEW BUDDING YEAST SEEN Performed at [facility], [place] [address], [place], [place] [address]    Culture ABUNDANT CANDIDA TROPICALIS  Final   Report Status 04/18/2017 FINAL  Final  CULTURE, BLOOD (ROUTINE X 2) w Reflex to ID Panel     Status: None   Collection Time:  04/15/17 10:46 AM  Result Value Ref Range Status   Specimen Description BLOOD LEFT SUBCLAVIAN TRIPLE LUMEN  Final   Special Requests   Final    BOTTLES DRAWN AEROBIC AND ANAEROBIC Blood Culture results may not be optimal due to an excessive volume of blood received in culture bottles   Culture NO GROWTH 5 DAYS  Final   Report Status 04/20/2017 FINAL  Final  CULTURE, BLOOD (ROUTINE X 2) w Reflex to ID Panel     Status: None   Collection Time: 04/16/17  1:21 PM  Result Value Ref Range Status   Specimen Description BLOOD BLOOD LEFT HAND  Final   Special Requests   Final    BOTTLES DRAWN AEROBIC AND ANAEROBIC Blood Culture results may not be optimal due to an inadequate volume of blood received in culture bottles   Culture NO GROWTH 5 DAYS  Final   Report Status 04/21/2017 FINAL  Final  CULTURE, BLOOD (ROUTINE X 2) w Reflex to ID Panel     Status: None   Collection Time: 04/16/17  1:27 PM  Result Value Ref Range Status   Specimen Description BLOOD A-LINE DRAW  Final   Special Requests   Final    BOTTLES DRAWN AEROBIC AND ANAEROBIC Blood Culture adequate volume   Culture NO GROWTH 5 DAYS  Final   Report Status 04/21/2017 FINAL  Final  C difficile quick scan w PCR reflex     Status: None   Collection Time: 04/16/17 10:15 PM  Result Value Ref Range Status   C Diff antigen NEGATIVE NEGATIVE Final   C Diff toxin NEGATIVE NEGATIVE Final   C Diff interpretation No C. difficile detected.  Final    Comment: VALID     Studies: Dg Chest Port 1 View  Result Date: 04/21/2017 CLINICAL DATA:  Respiratory failure.  Followup exam. EXAM: PORTABLE CHEST 1 VIEW COMPARISON:  04/19/2017 FINDINGS: Has been mild improvement in lung aeration since the previous exam with improved lung opacity most evident in the mid lungs. Persistent lower lung zone opacity obscures hemidiaphragms, greater on the right, likely combination of pleural effusions with atelectasis, pneumonia or a combination. There is no evidence of  pulmonary edema. No pneumothorax. New enteric feeding tube passes below the diaphragm and below the included field of view. Bilateral internal jugular central venous lines are stable and well positioned. IMPRESSION: 1. Mild improvement in lung aeration since the previous exam. 2. New enteric feeding tube passes below the diaphragm,  not completely visualized on this exam. Electronically Signed   By: Lajean Manes M.D.   On: 04/21/2017 08:05    Scheduled Meds: . amiodarone  400 mg Per Tube BID  . feeding supplement (ENSURE ENLIVE)  237 mL Oral TID BM  . levothyroxine  75 mcg Per Tube QAC breakfast  . mouth rinse  15 mL Mouth Rinse BID  . QUEtiapine  25 mg Per Tube q morning - 10a  . QUEtiapine  50 mg Oral QHS  . silver sulfADIAZINE   Topical BID   Continuous Infusions: . albumin human    . feeding supplement (VITAL AF 1.2 CAL) 1,000 mL (04/22/17 0000)  . pureflow 3 each (04/22/17 0545)    Assessment/Plan:  1. Acute delirium. Has not slept in few nights. Was given a dose of Seroquel this morning and again will be given tonight. I told the patient to close her eyes and go to sleep.  2. Septic shock with multiorgan failure. Overall prognosis is poor. Patient currently a DO NOT RESUSCITATE. 3. PEA Cardiopulmonary arrest.  4. Shock liver from cardiopulmonary arrest. Liver function trending better. Total bilirubin the slowest to respond still up at 4.0. 5. Acute kidney injury. Continue his dialysis needed to be held secondary to hypotension. Still not making urine. 6. Acute respiratory failure status post extubation and on nasal cannula. Patient slowed her breathing pattern and was taking deep breaths. 7. Hypomagnesemia, hypophosphatemia and hypokalemia. Phosphorus level low today. On electrolyte protocol 8. Atrial fibrillation on amiodarone switched to. Pradaxa on hold.  9. Coagulopathy. Could be secondary to shock liver. 10. Diarrhea. resolved  11. Cardiomyopathy on  echocardiogram. 12. Anemia and thrombocytopenia. Continue to monitor closely  13. Hypothyroidism unspecified on levothyroxine 14. Depression on Zoloft 15. Nutritional status. Patient with very poor oral intake and unable to keep up with her nutritional needs. Continue Dobbhoff tube feeding.  Code Status:     Code Status Orders        Start     Ordered   04/08/17 0651  Full code  Continuous     04/08/17 0650    Code Status History    Date Active Date Inactive Code Status Order ID Comments User Context   This patient has a current code status but no historical code status.     Family Communication: Spoke with Husband at bedside Disposition Plan: To be determined based on clinical course  Consultants:  Critical care specialist  Infectious disease  Nephrology  Palliative care  Cardiology  Time spent: 22 minutes.   Loletha Grayer  [employer]

## 2017-04-22 NOTE — Progress Notes (Signed)
Despite attempts to decrease UF rate to 50 and keep pt aroused her bp remains low.  Dr Candiss Norse called. Will Rinse back CRRT and disconnect until SBP maintain MAP 70

## 2017-04-22 NOTE — Progress Notes (Signed)
[facility]  ROUNDING NOTE   Subjective:   Patient remains critically ill.  Continued on CRRT UF at currently 200 cc an hour; net uf 4380 cc last 24 hrs Breathing is little better Legs appear to we less swollen; skin is wrinkling  Objective:  Vital signs in last 24 hours:  Temp:  [96.8 F (36 C)-98.4 F (36.9 C)] 96.8 F (36 C) (07/15 1205) Pulse Rate:  [79-123] 99 (07/15 1205) Resp:  [11-24] 14 (07/15 1205) BP: (65-106)/(33-83) 70/47 (07/15 1205) SpO2:  [92 %-100 %] 98 % (07/15 1205) Weight:  [81.1 kg (178 lb 12.7 oz)] 81.1 kg (178 lb 12.7 oz) (07/15 0400)  Weight change: -4 kg (-8 lb 13.1 oz) Filed Weights   04/20/17 0413 04/21/17 0500 04/22/17 0400  Weight: 87.7 kg (193 lb 5.5 oz) 85.1 kg (187 lb 9.8 oz) 81.1 kg (178 lb 12.7 oz)    Intake/Output: I/O last 3 completed shifts: In: 3777.2 [P.O.:120; I.V.:267.2; NG/GT:2340; IV Piggyback:1050] Out: 6890 [Other:6890]   Intake/Output this shift:  Total I/O In: 260 [NG/GT:260] Out: 962 [Other:962]  Physical Exam: General: Critically ill  Head: Normocephalic, atraumatic  Eyes: + jaundice  Neck: No masses   Lungs:  Mild crackles at bases  Heart: Irregular, tachycardia  Abdomen:  Soft, nontender, obese  Extremities: Anasarca with massive lower extremity edema.    Neurologic: Alert, able to follow commands  Skin: Bullous lesions with serous sanginous drainage  Access: Right IJ temp cath 7/[facility])    Basic Metabolic Panel:  Recent Labs Lab 04/20/17 1401 04/21/17 0025 04/21/17 0500 04/21/17 1314 04/21/17 1938 04/22/17 0429 04/22/17 0430  NA 139 137 137 135 136  --  138  K 4.3 4.2 4.5 4.5 4.4  --  5.3*  CL 103 103 102 102 104  --  103  CO2 27 28 27 28 26   --  27  GLUCOSE 127* 105* 112* 117* 112*  --  110*  BUN 19 19 20  23* 23*  --  29*  CREATININE 0.56 0.48 0.65 0.54 0.56  --  0.64  CALCIUM 9.2 8.8* 8.9 8.6* 8.6*  --  9.0  MG 2.0 1.8 1.8 1.8 1.6* 2.2  --   PHOS 2.6 1.6*  --  1.2*  <1.0*  --  2.0*    Liver Function Tests:  Recent Labs Lab 04/16/17 0550  04/17/17 0452  04/18/17 0505  04/19/17 0544  04/21/17 0025 04/21/17 0500 04/21/17 1314 04/21/17 1938 04/22/17 0430  AST 219*  --  133*  --  95*  --  83*  --   --  67*  --   --   --   ALT 752*  --  567*  --  402*  --  298*  --   --  190*  --   --   --   ALKPHOS 608*  --  598*  --  546*  --   --   --   --  462*  --   --   --   BILITOT 4.3*  --  4.7*  --  5.9*  --   --   --   --  4.0*  --   --   --   PROT 4.7*  --  5.5*  --  6.2*  --   --   --   --  6.2*  --   --   --   ALBUMIN 2.5*  < > 3.1*  < > 3.7  < >  4.3  < > 3.5 3.5 3.4* 3.5 3.9  < > = values in this interval not displayed. No results for input(s): LIPASE, AMYLASE in the last 168 hours. No results for input(s): AMMONIA in the last 168 hours.  CBC:  Recent Labs Lab 04/15/17 1352  04/17/17 0452 04/18/17 0505 04/19/17 0545 04/21/17 0500 04/22/17 0429  WBC 27.6*  < > 30.8* 27.9* 18.8* 13.0* 10.8  NEUTROABS 24.8*  --  28.0* 25.7* 15.0*  --   --   HGB 7.4*  < > 7.6* 7.4* 7.5* 7.5* 7.6*  HCT 22.4*  < > 23.1* 22.2* 23.1* 23.1* 23.0*  MCV 90.7  < > 92.8 94.3 96.4 97.4 97.9  PLT 42*  < > 42* 47* 53* 58* 59*  < > = values in this interval not displayed.  Cardiac Enzymes: No results for input(s): CKTOTAL, CKMB, CKMBINDEX, TROPONINI in the last 168 hours.  BNP: Invalid input(s): POCBNP  CBG:  Recent Labs Lab 04/21/17 0740 04/21/17 1220 04/21/17 1604 04/21/17 2352 04/22/17 1148  GLUCAP 122* 114* 112* 136* 124*    Microbiology: Results for orders placed or performed during the hospital encounter of 04/08/17  Culture, blood (routine x 2)     Status: None   Collection Time: 04/08/17  2:04 AM  Result Value Ref Range Status   Specimen Description BLOOD RIGHT ANTECUBITAL  Final   Special Requests   Final    BOTTLES DRAWN AEROBIC AND ANAEROBIC Blood Culture adequate volume   Culture NO GROWTH 5 DAYS  Final   Report Status 04/13/2017 FINAL   Final  Culture, blood (routine x 2)     Status: None   Collection Time: 04/08/17  2:06 AM  Result Value Ref Range Status   Specimen Description BLOOD LEFT ANTECUBITAL  Final   Special Requests   Final    BOTTLES DRAWN AEROBIC AND ANAEROBIC Blood Culture adequate volume   Culture NO GROWTH 5 DAYS  Final   Report Status 04/13/2017 FINAL  Final  C difficile quick scan w PCR reflex     Status: None   Collection Time: 04/08/17  5:09 AM  Result Value Ref Range Status   C Diff antigen NEGATIVE NEGATIVE Final   C Diff toxin NEGATIVE NEGATIVE Final   C Diff interpretation No C. difficile detected.  Final  MRSA PCR Screening     Status: None   Collection Time: 04/08/17  6:50 AM  Result Value Ref Range Status   MRSA by PCR NEGATIVE NEGATIVE Final    Comment:        The GeneXpert MRSA Assay (FDA approved for NASAL specimens only), is one component of a comprehensive MRSA colonization surveillance program. It is not intended to diagnose MRSA infection nor to guide or monitor treatment for MRSA infections.   Urine culture     Status: None   Collection Time: 04/08/17 12:16 PM  Result Value Ref Range Status   Specimen Description URINE, RANDOM  Final   Special Requests NONE  Final   Culture   Final    NO GROWTH Performed at [facility], [address] [place], [place] [address]    Report Status 04/10/2017 FINAL  Final  Aerobic Culture (superficial specimen)     Status: None   Collection Time: 04/09/17 11:32 AM  Result Value Ref Range Status   Specimen Description SKIN  Final   Special Requests NONE  Final   Gram Stain   Final    RARE WBC PRESENT, PREDOMINANTLY  PMN NO SQUAMOUS EPITHELIAL CELLS SEEN NO ORGANISMS SEEN    Culture   Final    NO GROWTH 2 DAYS Performed at [facility], [place] [address] [place], [place] [address]    Report Status 04/11/2017 FINAL  Final  Gastrointestinal Panel by PCR , Stool     Status: None   Collection Time: 04/09/17  5:10 PM  Result  Value Ref Range Status   Campylobacter species NOT DETECTED NOT DETECTED Final   Plesimonas shigelloides NOT DETECTED NOT DETECTED Final   Salmonella species NOT DETECTED NOT DETECTED Final   Yersinia enterocolitica NOT DETECTED NOT DETECTED Final   Vibrio species NOT DETECTED NOT DETECTED Final   Vibrio cholerae NOT DETECTED NOT DETECTED Final   Enteroaggregative E coli (EAEC) NOT DETECTED NOT DETECTED Final   Enteropathogenic E coli (EPEC) NOT DETECTED NOT DETECTED Final   Enterotoxigenic E coli (ETEC) NOT DETECTED NOT DETECTED Final   Shiga like toxin producing E coli (STEC) NOT DETECTED NOT DETECTED Final   Shigella/Enteroinvasive E coli (EIEC) NOT DETECTED NOT DETECTED Final   Cryptosporidium NOT DETECTED NOT DETECTED Final   Cyclospora cayetanensis NOT DETECTED NOT DETECTED Final   Entamoeba histolytica NOT DETECTED NOT DETECTED Final   Giardia lamblia NOT DETECTED NOT DETECTED Final   Adenovirus F40/41 NOT DETECTED NOT DETECTED Final   Astrovirus NOT DETECTED NOT DETECTED Final   Norovirus GI/GII NOT DETECTED NOT DETECTED Final   Rotavirus A NOT DETECTED NOT DETECTED Final   Sapovirus (I, II, IV, and V) NOT DETECTED NOT DETECTED Final  Culture, respiratory (NON-Expectorated)     Status: None   Collection Time: 04/12/17 12:00 PM  Result Value Ref Range Status   Specimen Description TRACHEAL ASPIRATE  Final   Special Requests NONE  Final   Gram Stain   Final    MODERATE WBC PRESENT,BOTH PMN AND MONONUCLEAR MODERATE YEAST Performed at [facility], [address], [place], [place] [address]    Culture MODERATE CANDIDA TROPICALIS  Final   Report Status 04/14/2017 FINAL  Final  Culture, respiratory (NON-Expectorated)     Status: None   Collection Time: 04/15/17  7:48 AM  Result Value Ref Range Status   Specimen Description TRACHEAL ASPIRATE  Final   Special Requests Normal  Final   Gram Stain   Final    RARE WBC PRESENT, PREDOMINANTLY PMN FEW BUDDING YEAST  SEEN Performed at [facility], [place] [address] [place], [place] [address]    Culture ABUNDANT CANDIDA TROPICALIS  Final   Report Status 04/18/2017 FINAL  Final  CULTURE, BLOOD (ROUTINE X 2) w Reflex to ID Panel     Status: None   Collection Time: 04/15/17 10:46 AM  Result Value Ref Range Status   Specimen Description BLOOD LEFT SUBCLAVIAN TRIPLE LUMEN  Final   Special Requests   Final    BOTTLES DRAWN AEROBIC AND ANAEROBIC Blood Culture results may not be optimal due to an excessive volume of blood received in culture bottles   Culture NO GROWTH 5 DAYS  Final   Report Status 04/20/2017 FINAL  Final  CULTURE, BLOOD (ROUTINE X 2) w Reflex to ID Panel     Status: None   Collection Time: 04/16/17  1:21 PM  Result Value Ref Range Status   Specimen Description BLOOD BLOOD LEFT HAND  Final   Special Requests   Final    BOTTLES DRAWN AEROBIC AND ANAEROBIC Blood Culture results may not be optimal due to an  inadequate volume of blood received in culture bottles   Culture NO GROWTH 5 DAYS  Final   Report Status 04/21/2017 FINAL  Final  CULTURE, BLOOD (ROUTINE X 2) w Reflex to ID Panel     Status: None   Collection Time: 04/16/17  1:27 PM  Result Value Ref Range Status   Specimen Description BLOOD A-LINE DRAW  Final   Special Requests   Final    BOTTLES DRAWN AEROBIC AND ANAEROBIC Blood Culture adequate volume   Culture NO GROWTH 5 DAYS  Final   Report Status 04/21/2017 FINAL  Final  C difficile quick scan w PCR reflex     Status: None   Collection Time: 04/16/17 10:15 PM  Result Value Ref Range Status   C Diff antigen NEGATIVE NEGATIVE Final   C Diff toxin NEGATIVE NEGATIVE Final   C Diff interpretation No C. difficile detected.  Final    Comment: VALID    Coagulation Studies: No results for input(s): LABPROT, INR in the last 72 hours.  Urinalysis: No results for input(s): COLORURINE, LABSPEC, PHURINE, GLUCOSEU, HGBUR, BILIRUBINUR, KETONESUR, PROTEINUR, UROBILINOGEN, NITRITE,  LEUKOCYTESUR in the last 72 hours.  Invalid input(s): APPERANCEUR    Imaging: Dg Chest Port 1 View  Result Date: 04/21/2017 CLINICAL DATA:  Respiratory failure.  Followup exam. EXAM: PORTABLE CHEST 1 VIEW COMPARISON:  04/19/2017 FINDINGS: Has been mild improvement in lung aeration since the previous exam with improved lung opacity most evident in the mid lungs. Persistent lower lung zone opacity obscures hemidiaphragms, greater on the right, likely combination of pleural effusions with atelectasis, pneumonia or a combination. There is no evidence of pulmonary edema. No pneumothorax. New enteric feeding tube passes below the diaphragm and below the included field of view. Bilateral internal jugular central venous lines are stable and well positioned. IMPRESSION: 1. Mild improvement in lung aeration since the previous exam. 2. New enteric feeding tube passes below the diaphragm, not completely visualized on this exam. Electronically Signed   By: Lajean Manes M.D.   On: 04/21/2017 08:05     Medications:   . albumin human    . feeding supplement (VITAL AF 1.2 CAL) 1,000 mL (04/22/17 0000)  . pureflow 3 each (04/22/17 0545)   . amiodarone  400 mg Per Tube BID  . feeding supplement (ENSURE ENLIVE)  237 mL Oral TID BM  . levothyroxine  75 mcg Per Tube QAC breakfast  . mouth rinse  15 mL Mouth Rinse BID  . QUEtiapine  25 mg Per Tube q morning - 10a  . QUEtiapine  50 mg Oral QHS  . silver sulfADIAZINE   Topical BID   artificial tears, heparin, ipratropium-albuterol, LORazepam, metoprolol tartrate, morphine injection, [DISCONTINUED] ondansetron **OR** ondansetron (ZOFRAN) IV, phenol, sodium chloride flush, zinc oxide  Assessment/ Plan:  [name] is a 66 y.o. white female  with hypertension, hypothyroidism, atrial fibrillation, who was admitted to [facility] on 04/08/2017 for evaluation of increasing lower extremity edema.  Hospital course: Admitted on 04/08/2017 with significant peripheral edema  and cellulitis. Started on CRRT for hypotension and anuric renal failure on 7/2. Patient weaned off CRRT on 7/4. However later that day, patient had cardiac arrest and coded. Intubated, sedated and restarted on three vasopressors. Restart on CRRT on 7/4.  2-D echo which shows EF 25-30%  1. Acute renal failure with proteinuria and hematuria: baseline creatinine of 0.62 09/2014.  Patient with massive peripheral edema, anasarca and hypoalbuminemia.  Recent IV contrast exposure on 04/04/17.  - Serologic  work up: compliments normal, negative SPEP/UPEP, ANA negative, ANCA negative, GBM negative, negative hepatitis - Continue CVVHD. Continue UF at 200 cc / hr; Therapy fluid adjusted to 1.3 L/hr as phos was low  - neg 4400 cc last 24 hrs - Albumin had to be restarted yesterday evening as blood pressure was starting to stay low   2. 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 Echocardiogram with EF of 25-30% - fluid removal with CRRT as tolerated   3. Acute respiratory failure Extubated 7/9 Acute pulm edema - increase UF as above  4. 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 Hemoglobin A1c 6.2% - continue glucose control.    LOS: 51 Norleen Xie 7/15/201812:25 PM  PS: Notified by nurse that patient received Seroquel. She fell asleep but since then her systolic blood pressure has been low in the 60s. Initially, UF was adjusted down to 50 cc per hour and patient given an extra IV albumin but that did not improve her blood pressure. Therefore, we will hold CRRT for now. We plan to restart CRRT once her systolic blood pressure is greater than 80-90. She probably has another 8-10 L of fluid. Plan was to continue CRRT for another 1-2 days to optimize volume status. After that, if blood pressure allows, we will consider intermittent hemodialysis.
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
## 2017-04-22 NOTE — Progress Notes (Signed)
PT Cancellation Note  Patient Details Name: [name] MRN: [id number] DOB: 03-31-1951   Cancelled Treatment:    Reason Eval/Treat Not Completed: Medical issues which prohibited therapy (Per chart review, patient with marked hypotension, increasing confusion/agitation/paranoia.  Not appropriate for PT intervention at this time.  Will continue efforts next date as medically appropriate.)   Bladyn Tipps H. Owens Shark, PT, DPT, NCS 04/22/17, 1:06 PM [phone]

## 2017-04-22 NOTE — Progress Notes (Signed)
[facility] Progress Note Patient Name: [name] DOB: 11-18-1950 MRN: [id number]   Date of Service  04/22/2017  HPI/Events of Note  Hypotension on CRRT.  Did not respond to albumin.  eICU Interventions  Start levophed to help maintain MAP 65     Intervention Category Major Interventions: Hypotension - evaluation and management  Mauri Brooklyn, P 04/22/2017, 4:00 PM

## 2017-04-23 LAB — MAGNESIUM
MAGNESIUM: 1.8 mg/dL (ref 1.7–2.4)
MAGNESIUM: 1.8 mg/dL (ref 1.7–2.4)
Magnesium: 2.3 mg/dL (ref 1.7–2.4)

## 2017-04-23 LAB — CBC
HCT: 22.6 % — ABNORMAL LOW (ref 35.0–47.0)
Hemoglobin: 7.4 g/dL — ABNORMAL LOW (ref 12.0–16.0)
MCH: 31.7 pg (ref 26.0–34.0)
MCHC: 32.6 g/dL (ref 32.0–36.0)
MCV: 97.4 fL (ref 80.0–100.0)
PLATELETS: 73 10*3/uL — AB (ref 150–440)
RBC: 2.32 MIL/uL — ABNORMAL LOW (ref 3.80–5.20)
RDW: 22 % — ABNORMAL HIGH (ref 11.5–14.5)
WBC: 9.9 10*3/uL (ref 3.6–11.0)

## 2017-04-23 LAB — HEPATIC FUNCTION PANEL
ALK PHOS: 421 U/L — AB (ref 38–126)
ALT: 139 U/L — ABNORMAL HIGH (ref 14–54)
AST: 83 U/L — ABNORMAL HIGH (ref 15–41)
Albumin: 4 g/dL (ref 3.5–5.0)
BILIRUBIN DIRECT: 1.5 mg/dL — AB (ref 0.1–0.5)
BILIRUBIN INDIRECT: 1.7 mg/dL — AB (ref 0.3–0.9)
TOTAL PROTEIN: 6.5 g/dL (ref 6.5–8.1)
Total Bilirubin: 3.2 mg/dL — ABNORMAL HIGH (ref 0.3–1.2)

## 2017-04-23 LAB — RENAL FUNCTION PANEL
ALBUMIN: 4.1 g/dL (ref 3.5–5.0)
Albumin: 4 g/dL (ref 3.5–5.0)
Anion gap: 10 (ref 5–15)
Anion gap: 8 (ref 5–15)
BUN: 39 mg/dL — ABNORMAL HIGH (ref 6–20)
BUN: 41 mg/dL — AB (ref 6–20)
CALCIUM: 8.7 mg/dL — AB (ref 8.9–10.3)
CHLORIDE: 100 mmol/L — AB (ref 101–111)
CO2: 27 mmol/L (ref 22–32)
CO2: 27 mmol/L (ref 22–32)
CREATININE: 0.77 mg/dL (ref 0.44–1.00)
Calcium: 8.8 mg/dL — ABNORMAL LOW (ref 8.9–10.3)
Chloride: 100 mmol/L — ABNORMAL LOW (ref 101–111)
Creatinine, Ser: 0.77 mg/dL (ref 0.44–1.00)
GFR calc Af Amer: >60 mL/min (ref >60)
GFR calc Af Amer: >60 mL/min (ref >60)
GFR calc non Af Amer: >60 mL/min (ref >60)
GFR calc non Af Amer: >60 mL/min (ref >60)
GLUCOSE: 125 mg/dL — AB (ref 65–99)
GLUCOSE: 138 mg/dL — AB (ref 65–99)
PHOSPHORUS: 1.9 mg/dL — AB (ref 2.5–4.6)
POTASSIUM: 4.5 mmol/L (ref 3.5–5.1)
Phosphorus: 2.3 mg/dL — ABNORMAL LOW (ref 2.5–4.6)
Potassium: 4.8 mmol/L (ref 3.5–5.1)
SODIUM: 137 mmol/L (ref 135–145)
Sodium: 135 mmol/L (ref 135–145)

## 2017-04-23 LAB — GLUCOSE, CAPILLARY
Glucose-Capillary: 114 mg/dL — ABNORMAL HIGH (ref 65–99)
Glucose-Capillary: 117 mg/dL — ABNORMAL HIGH (ref 65–99)

## 2017-04-23 LAB — APTT: APTT: 34 s (ref 24–36)

## 2017-04-23 MED ORDER — POTASSIUM & SODIUM PHOSPHATES 280-160-250 MG PO PACK
1.0000 | PACK | Freq: Three times a day (TID) | ORAL | Status: DC
  Administered 2017-04-23 (×2): 1 via ORAL
  Filled 2017-04-23 (×3): qty 1

## 2017-04-23 MED ORDER — MAGNESIUM SULFATE 2 GM/50ML IV SOLN
2.0000 g | Freq: Once | INTRAVENOUS | Status: AC
  Administered 2017-04-23: 2 g via INTRAVENOUS
  Filled 2017-04-23: qty 50

## 2017-04-23 MED ORDER — QUETIAPINE FUMARATE 25 MG PO TABS
25.0000 mg | ORAL_TABLET | Freq: Once | ORAL | Status: AC
  Administered 2017-04-24: 25 mg via ORAL
  Filled 2017-04-23: qty 1

## 2017-04-23 MED ORDER — VITAL AF 1.2 CAL PO LIQD
1000.0000 mL | ORAL | Status: DC
  Administered 2017-04-23 – 2017-04-26 (×4): 1000 mL

## 2017-04-23 MED ORDER — FAMOTIDINE 20 MG PO TABS
20.0000 mg | ORAL_TABLET | Freq: Every day | ORAL | Status: DC
  Administered 2017-04-23 – 2017-04-29 (×7): 20 mg
  Filled 2017-04-23 (×7): qty 1

## 2017-04-23 NOTE — Progress Notes (Signed)
Patient ID: [name], female   DOB: 09/09/1951, 66 y.o.   MRN: [id number]     [facility] PROGRESS NOTE  Nancey Kreitz OTL:[id number] DOB: 1951-02-06 DOA: 04/08/2017 PCP: Tracie Harrier, MD  HPI/Subjective: Patient is lethargic, still on Levophed drip. On O2 [place] 2L. Objective: Vitals:   04/23/17 1215 04/23/17 1300  BP: 95/66 94/71  Pulse: (!) 114 100  Resp: 13 11  Temp: (!) 97.5 F (36.4 C)     Filed Weights   04/21/17 0500 04/22/17 0400 04/23/17 0402  Weight: 187 lb 9.8 oz (85.1 kg) 178 lb 12.7 oz (81.1 kg) 179 lb 3.7 oz (81.3 kg)    ROS: Review of Systems  Unable to perform ROS: Mental acuity  Constitutional: Positive for malaise/fatigue.  Respiratory: Negative for shortness of breath.   Cardiovascular: Positive for leg swelling. Negative for chest pain.  Gastrointestinal: Negative for abdominal pain.  Neurological: Positive for weakness.    Exam: Physical Exam  Constitutional: She appears lethargic.  HENT:  Head: Atraumatic.  Nose: No mucosal edema.  Mouth/Throat: No oropharyngeal exudate or posterior oropharyngeal edema.  Eyes: Pupils are equal, round, and reactive to light. Lids are normal.  Icteric sclera  Neck: Carotid bruit is not present. No thyromegaly present.  Cardiovascular: Regular rhythm, S1 normal, S2 normal and normal heart sounds.   Respiratory: She has decreased breath sounds in the right lower field and the left lower field. She has no wheezes. She has rhonchi in the left lower field. She has no rales.  GI: Soft. Bowel sounds are normal. There is no tenderness.  Musculoskeletal:       Right ankle: She exhibits swelling.       Left ankle: She exhibits swelling.  Lymphadenopathy:    She has no cervical adenopathy.  Neurological: She appears lethargic.  Able to wiggle toes bilateral feet. Able to squeeze hands.  Skin: Skin is warm. No cyanosis.  Left lower extremity covered today Right foot with some blisters that are healing Bruising  right neck and left thigh  Psychiatric: Her affect is blunt.      Data Reviewed: Basic Metabolic Panel:  Recent Labs Lab 04/21/17 1938 04/22/17 0429 04/22/17 0430 04/22/17 1335 04/22/17 2147 04/23/17 0437  NA 136  --  138 135 137 137  K 4.4  --  5.3* 4.9 4.8 4.8  CL 104  --  103 99* 102 100*  CO2 26  --  27 26 28 27   GLUCOSE 112*  --  110* 116* 132* 125*  BUN 23*  --  29* 36* 39* 39*  CREATININE 0.56  --  0.64 0.85 0.79 0.77  CALCIUM 8.6*  --  9.0 8.7* 8.5* 8.7*  MG 1.6* 2.2  --  1.9 1.8 1.8  PHOS <1.0*  --  2.0* 2.8 2.6 2.3*   Liver Function Tests: CBC:  Recent Labs Lab 04/17/17 0452 04/18/17 0505 04/19/17 0545 04/21/17 0500 04/22/17 0429 04/23/17 0437  WBC 30.8* 27.9* 18.8* 13.0* 10.8 9.9  NEUTROABS 28.0* 25.7* 15.0*  --   --   --   HGB 7.6* 7.4* 7.5* 7.5* 7.6* 7.4*  HCT 23.1* 22.2* 23.1* 23.1* 23.0* 22.6*  MCV 92.8 94.3 96.4 97.4 97.9 97.4  PLT 42* 47* 53* 58* 59* 73*   BNP (last 3 results)  Recent Labs  04/08/17 0139  BNP 2,012.0*     CBG:  Recent Labs Lab 04/21/17 2352 04/22/17 1148 04/22/17 1604 04/22/17 2347 04/23/17 0843  GLUCAP 136* 124* 86 121* 114*  Recent Results (from the past 240 hour(s))  Culture, respiratory (NON-Expectorated)     Status: None   Collection Time: 04/15/17  7:48 AM  Result Value Ref Range Status   Specimen Description TRACHEAL ASPIRATE  Final   Special Requests Normal  Final   Gram Stain   Final    RARE WBC PRESENT, PREDOMINANTLY PMN FEW BUDDING YEAST SEEN Performed at [facility], [address], [place], [place] [address]    Culture ABUNDANT CANDIDA TROPICALIS  Final   Report Status 04/18/2017 FINAL  Final  CULTURE, BLOOD (ROUTINE X 2) w Reflex to ID Panel     Status: None   Collection Time: 04/15/17 10:46 AM  Result Value Ref Range Status   Specimen Description BLOOD LEFT SUBCLAVIAN TRIPLE LUMEN  Final   Special Requests   Final    BOTTLES DRAWN AEROBIC AND ANAEROBIC Blood Culture results  may not be optimal due to an excessive volume of blood received in culture bottles   Culture NO GROWTH 5 DAYS  Final   Report Status 04/20/2017 FINAL  Final  CULTURE, BLOOD (ROUTINE X 2) w Reflex to ID Panel     Status: None   Collection Time: 04/16/17  1:21 PM  Result Value Ref Range Status   Specimen Description BLOOD BLOOD LEFT HAND  Final   Special Requests   Final    BOTTLES DRAWN AEROBIC AND ANAEROBIC Blood Culture results may not be optimal due to an inadequate volume of blood received in culture bottles   Culture NO GROWTH 5 DAYS  Final   Report Status 04/21/2017 FINAL  Final  CULTURE, BLOOD (ROUTINE X 2) w Reflex to ID Panel     Status: None   Collection Time: 04/16/17  1:27 PM  Result Value Ref Range Status   Specimen Description BLOOD A-LINE DRAW  Final   Special Requests   Final    BOTTLES DRAWN AEROBIC AND ANAEROBIC Blood Culture adequate volume   Culture NO GROWTH 5 DAYS  Final   Report Status 04/21/2017 FINAL  Final  C difficile quick scan w PCR reflex     Status: None   Collection Time: 04/16/17 10:15 PM  Result Value Ref Range Status   C Diff antigen NEGATIVE NEGATIVE Final   C Diff toxin NEGATIVE NEGATIVE Final   C Diff interpretation No C. difficile detected.  Final    Comment: VALID     Studies: No results found.  Scheduled Meds: . amiodarone  400 mg Per Tube BID  . feeding supplement (ENSURE ENLIVE)  237 mL Oral TID BM  . levothyroxine  75 mcg Per Tube QAC breakfast  . mouth rinse  15 mL Mouth Rinse BID  . QUEtiapine  25 mg Oral QHS  . silver sulfADIAZINE   Topical BID   Continuous Infusions: . albumin human    . feeding supplement (VITAL AF 1.2 CAL) 1,000 mL (04/23/17 1006)  . norepinephrine (LEVOPHED) Adult infusion 12 mcg/min (04/23/17 1200)  . pureflow 3 each (04/23/17 1037)    Assessment/Plan:  1. Acute delirium.  She did not slept in few nights. Was given a dose of Seroquel. Better. 2. Septic shock with multiorgan failure. Overall prognosis  is poor. Patient currently a DO NOT RESUSCITATE. On Levophed drip. 3. PEA Cardiopulmonary arrest.  4. Shock liver from cardiopulmonary arrest. Liver function trending better. Total bilirubin decreased to 3.2. 5. Acute kidney injury. Continue CRRT per Dr. Holley Raring. 6. Acute respiratory failure status post extubation and on nasal  cannula. continue O2 [place], NEB. 7. Hypomagnesemia, hypophosphatemia and hypokalemia. On electrolyte protocol 8. Atrial fibrillation with RVR,  continue amiodarone and Lopressor IV when necessary. Pradaxa on hold.  9. Coagulopathy. Could be secondary to shock liver. 10. Diarrhea. resolved  11. Cardiomyopathy on echocardiogram. 12. Anemia and thrombocytopenia. Continue to monitor closely  13. Hypothyroidism unspecified on levothyroxine 14. Depression on Zoloft 15. Nutritional status. Patient with very poor oral intake and unable to keep up with her nutritional needs. Continue Dobbhoff tube feeding.  I discussed with Dr. Mortimer Fries and Dr. Holley Raring. Code Status:     Code Status Orders        Start     Ordered   04/08/17 0651  Full code  Continuous     04/08/17 0650    Code Status History    Date Active Date Inactive Code Status Order ID Comments User Context   This patient has a current code status but no historical code status.     Family Communication: Spoke with Husband at bedside Disposition Plan: To be determined based on clinical course  Consultants:  Critical care specialist  Infectious disease  Nephrology  Palliative care  Cardiology  Time spent: 37 minutes.   Demetrios Loll  [employer]

## 2017-04-23 NOTE — Consult Note (Signed)
[facility] Nurse wound follow up Reason for Consult: Bilateral feet and LLE (anterior aspect) with increased edema, blistering and ecchymosis. Reconsulted.  No change.  Will renew orders.  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 Left foot: 2nd digit with intact blister (serum filled) measuring 2cm x 1.5cm.  Anterior foot with 3cm x 5cm purple discoloration.  There aer several small intact serum filled blisters on the lateral and medial foot, the largest of which measures 0.4cm round x 0.1cm. Left LE (anterior aspect):  9cm x 5cm area of purple discoloration with loose hanging serum filled blister at distal and lateral edge of wound measuring 4cm x 2cm. Wound bed:As described above Drainage (amount, consistency, odor) Serous Periwound: intact.  Bilateral LE edema is resolving with elevation, but is still present. Dressing procedure/placement/frequency: Will continue  twice daily wound care using silver sulfadiazine cream (Silvadene) topped with moist dressings and covered with dry padded dressings.  This will be secured with toe-to-knee Kerlix roll gauze topped with ACE wrap applied in the same fashion for gentle compression.  Elevation will be enhanced and feet protected by Prevalon pressure redistribution heel boots.  Will not follow at this time.  Please re-consult if needed.  Domenic Moras RN BSN [place] Pager [phone]

## 2017-04-23 NOTE — Progress Notes (Signed)
CH made follow up visit with pt. met with pt and her husband who was at the bedside. Pt appeared to be doing much better today than she did last weekend. Pt's husband states that he was pleased by the steady progress pt was making today and was hopeful she will regain her health at some point. Husband requested [facility] for prayers to help him cope, which [facility] [place] provided with a ministry of presence. [place] to make a follow up with this pt as needed.      04/23/17 1500  Clinical Encounter Type  Visited With Patient and family together  Visit Type Follow-up;Spiritual support  Referral From Chaplain  Consult/Referral To Chaplain  Spiritual Encounters  Spiritual Needs Prayer;Emotional

## 2017-04-23 NOTE — Progress Notes (Signed)
Patient continues on CRRT.  She remains on Levophed at 7 mcgs with MAP >65.  Patient slept throughout shift. Evening dose of seroquel was decreased from 50mg  to 25 mg due to patient being very drowsy and lethargic. Oncoming nurse to discuss am Seroquel dose with provider. No urine output this shift. Small bowel movement.

## 2017-04-23 NOTE — Plan of Care (Signed)
Problem: Tissue Perfusion: Goal: Risk factors for ineffective tissue perfusion will decrease Outcome: Not Progressing Patient remains on Levophed drip, gtt has required titration up this shift in the event of hypotension. MAP goal of 65. Will continue to titrate as ordered.

## 2017-04-23 NOTE — Progress Notes (Signed)
PULMONARY / CRITICAL CARE MEDICINE   Name: Schylar Allard MRN: [id number] DOB: November 03, 1950    ADMISSION DATE:  04/08/2017  PT PROFILE:   66 y.o. F with PMH of CAF, hypothyroidism admitted via ED to ICU with many days of progressive LE edema, blistering lesions on BLE, progressive weakness. Adm diagnosis of septic shock, cellulitis, AKI.  Pt PEA arrested 07/4 ACLS protocol initiated ROSC within 2 minutes following interventions intubated during cardiac arrest and extubated 07/9.   MAJOR EVENTS/TEST RESULTS: 07/01 LE venous US: no DVT 07/01 Echocardiogram:  LVEF 25-30% 07/01 Nephrology consultation 07/02 ID consultation 07/02 CRRT initiated 07/03 Off vasopressors. Worsening tachycardia - amiodarone initiated 07/04 Pt PEA arrest ACLS protocol initiated ROSC 2 minutes now back on vasopressors  07/05 Intubated, shock req high dose vasopressors, on CRRT, elevated LFTs c/w congestion/shock liver, pulmonary edema vs ARDS on CXR 07/09 Pt extubated  07/11 remains on CRRT and vasopressors, resp status stable 07/12 remains on CRRT, on amiodarone. Dr Mortimer Fries addressed goals of care with pt's husband. Made DNR/DNI, continue current management without significant escalation. NGT placed for nutrition 07/13 off vasopressors, cont on CRRT with increased UF to 200/hr.  07/14 PCCM/Nephrology agree to continue CRRT through [facility]. 07/15 Increased confusion and intermittent agitation. Quetiapine initiated 7/16 remains on CRRT  INDWELLING DEVICES:: R fem HD cath 07/02 >> out ETT 07/04 >>07/09 Left IJ 07/04 >> out R IJ HD cath 07/12 >>   MICRO DATA: MRSA PCR 07/01 >> NEG Urine 07/01 >> NEG Blood 07/01 >> NEG C diff 07/01 >> NEG GI panel 07/02 >> NEG Resp 07/05 >> moderate candida tropicalis  Blood 07/08 >> NEG Blood 07/09 >> NEG Resp 07/08 >> abundant candida tropicalis Cdiff 07/09 >> NEG   ANTIMICROBIALS:  Vanc 07/01 >> 07/02 Pip-tazo 07/01 >> 7/10 Vancomycin 07/6 >>7/10   SUBJECTIVE:  No distress.  Remains confused and poorly oriented. No new complaints On CRRT  VITAL SIGNS: BP (!) 80/67   Pulse (!) 102   Temp 98.5 F (36.9 C) (Axillary)   Resp 14   Ht 5\' 5"  (1.651 m)   Wt 179 lb 3.7 oz (81.3 kg)   SpO2 99%   BMI 29.83 kg/m   VENTILATOR SETTINGS:    INTAKE / OUTPUT: I/O last 3 completed shifts: In: 4016.7 [P.O.:60; I.V.:326.7; NG/GT:2275; IV Piggyback:1355] Out: 1497 [Other:4848]  PHYSICAL EXAMINATION: General: NAD Neuro: RASS 0, no focal deficits HEENT: NCAT, sclericterus Cardiovascular: IRIR, no murmurs noted Lungs: Clear anteriorly  Abdomen: obese, soft, ND Ext: improved BUE and BLE edema  LABS:  BMET  Recent Labs Lab 04/22/17 1335 04/22/17 2147 04/23/17 0437  NA 135 137 137  K 4.9 4.8 4.8  CL 99* 102 100*  CO2 26 28 27   BUN 36* 39* 39*  CREATININE 0.85 0.79 0.77  GLUCOSE 116* 132* 125*    Electrolytes  Recent Labs Lab 04/22/17 1335 04/22/17 2147 04/23/17 0437  CALCIUM 8.7* 8.5* 8.7*  MG 1.9 1.8 1.8  PHOS 2.8 2.6 2.3*    CBC  Recent Labs Lab 04/21/17 0500 04/22/17 0429 04/23/17 0437  WBC 13.0* 10.8 9.9  HGB 7.5* 7.6* 7.4*  HCT 23.1* 23.0* 22.6*  PLT 58* 59* 73*    Coag's  Recent Labs Lab 04/21/17 0500 04/22/17 0429 04/23/17 0437  APTT 36 32 34    Sepsis Markers  Recent Labs Lab 04/16/17 1146 04/17/17 0452 04/18/17 0505  PROCALCITON 19.26 25.00 19.49    ABG  Recent Labs Lab 04/16/17 1100 04/19/17 1031  PHART 7.43  7.52*  PCO2ART 42 26*  PO2ART 106 65*    Liver Enzymes  Recent Labs Lab 04/18/17 0505  04/19/17 0544  04/21/17 0500  04/22/17 1335 04/22/17 2147 04/23/17 0437  AST 95*  --  83*  --  67*  --   --   --  83*  ALT 402*  --  298*  --  190*  --   --   --  139*  ALKPHOS 546*  --   --   --  462*  --   --   --  421*  BILITOT 5.9*  --   --   --  4.0*  --   --   --  3.2*  ALBUMIN 3.7  < > 4.3  < > 3.5  < > 3.7 3.8 4.0  4.0  < > = values in this interval not displayed.  Cardiac Enzymes No  results for input(s): TROPONINI, PROBNP in the last 168 hours.  Glucose  Recent Labs Lab 04/21/17 1220 04/21/17 1604 04/21/17 2352 04/22/17 1148 04/22/17 1604 04/22/17 2347  GLUCAP 114* 112* 136* 124* 75 121*    CXR: NNF  ASSESSMENT / PLAN: Acute hypoxic respiratory failure-slowly resolving Pulmonary edema Severe dilated cardiomyopathy Chronic AF Status post PEA arrest 07/04  Cardiogenic shock, resolved Anuric AKI - likely cardiorenal plus ATN Anasarca, improving Protein-calorie malnutrition Elevated LFTs due to shock liver, hepatic congestion - improving Diarrhea, resolved Anemia without evidence of acute blood loss Thrombocytopenia Elevated prothrombin time, resolved LE cellulitis - fully treated Severe deconditioning  Plan:  Continue supplemental oxygen Continue amiodarone - changed to to enteral 07/14 Continue CRRT through weekend Monitor BMET intermittently Monitor I/Os Correct electrolytes as indicated DVT px: SCDs Monitor CBC intermittently Transfuse per usual guidelines Continue TF protocol Physical therapy eval requested 07/14 DNR/DNI  Will remain in ICU/SDU as long as on CRRT      Natasha Burda [name], M.D.  [facility]  Medical Director [place] Director [facility] Cardio-Pulmonary Department

## 2017-04-23 NOTE — Progress Notes (Signed)
[place] for Electrolyte Replacement  Indication: CRRT   66 yo female receiving CRRT. Pharmacy consulted for electrolyte monitoring.    Plan:  Will order potassium phosphate packet TID x 3 doses. Will order magnesium 2g IV x 1 to replace to goal magnesium > 2.  Will recheck electrolytes with am labs.   No Known Allergies  Patient Measurements: Height: 5\' 5"  (165.1 cm) Weight: 179 lb 3.7 oz (81.3 kg) IBW/kg (Calculated) : 57 Vital Signs: Temp: 97.5 F (36.4 C) (07/16 1215) Temp Source: Axillary (07/16 1215) BP: 95/65 (07/16 1500) Pulse Rate: 118 (07/16 1500) Intake/Output from previous day: 07/15 0701 - 07/16 0700 In: 2236.7 [P.O.:60; I.V.:326.7; CB/UL:8453; IV Piggyback:355] Out: 2712  Intake/Output from this shift: Total I/O In: 700.6 [I.V.:69.9; NG/GT:580.7; IV Piggyback:50] Out: 1368 [Other:1368]  Labs:  Recent Labs  04/21/17 0500  04/22/17 0429  04/22/17 2147 04/23/17 0437 04/23/17 1428  WBC 13.0*  --  10.8  --   --  9.9  --   HGB 7.5*  --  7.6*  --   --  7.4*  --   HCT 23.1*  --  23.0*  --   --  22.6*  --   PLT 58*  --  59*  --   --  73*  --   APTT 36  --  32  --   --  34  --   CREATININE 0.65  < >  --   < > 0.79 0.77 0.77  MG 1.8  < > 2.2  < > 1.8 1.8 1.8  PHOS  --   < >  --   < > 2.6 2.3* 1.9*  ALBUMIN 3.5  < >  --   < > 3.8 4.0  4.0 4.1  PROT 6.2*  --   --   --   --  6.5  --   AST 67*  --   --   --   --  83*  --   ALT 190*  --   --   --   --  139*  --   ALKPHOS 462*  --   --   --   --  421*  --   BILITOT 4.0*  --   --   --   --  3.2*  --   BILIDIR  --   --   --   --   --  1.5*  --   IBILI  --   --   --   --   --  1.7*  --   < > = values in this interval not displayed.  Lab Results  Component Value Date   K 4.5 04/23/2017    Estimated Creatinine Clearance: 73.8 mL/min (by C-G formula based on SCr of 0.77 mg/dL).   Pharmacy will continue to monitor and adjust per consult.   MLS 04/23/17 4:50  PM

## 2017-04-23 NOTE — Progress Notes (Signed)
[facility]  ROUNDING NOTE   Subjective:  Patient remains on CRRT at this time. Still has some lower extremity edema. Remains relatively hypotensive.  Objective:  Vital signs in last 24 hours:  Temp:  [96.8 F (36 C)-98.5 F (36.9 C)] 97.5 F (36.4 C) (07/16 0837) Pulse Rate:  [91-121] 100 (07/16 0837) Resp:  [10-23] 14 (07/16 0837) BP: (62-127)/(41-87) 93/60 (07/16 0800) SpO2:  [95 %-100 %] 100 % (07/16 0837) Weight:  [81.3 kg (179 lb 3.7 oz)] 81.3 kg (179 lb 3.7 oz) (07/16 0402)  Weight change: 0.2 kg (7.1 oz) Filed Weights   04/21/17 0500 04/22/17 0400 04/23/17 0402  Weight: 85.1 kg (187 lb 9.8 oz) 81.1 kg (178 lb 12.7 oz) 81.3 kg (179 lb 3.7 oz)    Intake/Output: I/O last 3 completed shifts: In: 4016.7 [P.O.:60; I.V.:326.7; NG/GT:2275; IV Piggyback:1355] Out: 2423 [Other:5018]   Intake/Output this shift:  Total I/O In: -  Out: 171 [Other:171]  Physical Exam: General: Critically ill appearing  Head: Normocephalic, atraumatic  Eyes: + jaundice  Neck: supple  Lungs:  Mild crackles at bases, normal effort  Heart: Irregular, tachycardic  Abdomen:  Soft, nontender, obese  Extremities: Anasarca with massive lower extremity edema.    Neurologic: Alert, able to follow commands  Skin: Bullous lesions with serous sanginous drainage  Access: Right IJ temp cath 7/12 [facility])    Basic Metabolic Panel:  Recent Labs Lab 04/21/17 1938 04/22/17 0429 04/22/17 0430 04/22/17 1335 04/22/17 2147 04/23/17 0437  NA 136  --  138 135 137 137  K 4.4  --  5.3* 4.9 4.8 4.8  CL 104  --  103 99* 102 100*  CO2 26  --  27 26 28 27   GLUCOSE 112*  --  110* 116* 132* 125*  BUN 23*  --  29* 36* 39* 39*  CREATININE 0.56  --  0.64 0.85 0.79 0.77  CALCIUM 8.6*  --  9.0 8.7* 8.5* 8.7*  MG 1.6* 2.2  --  1.9 1.8 1.8  PHOS <1.0*  --  2.0* 2.8 2.6 2.3*    Liver Function Tests:  Recent Labs Lab 04/17/17 0452  04/18/17 0505  04/19/17 0544  04/21/17 0500   04/21/17 1938 04/22/17 0430 04/22/17 1335 04/22/17 2147 04/23/17 0437  AST 133*  --  95*  --  83*  --  67*  --   --   --   --   --  83*  ALT 567*  --  402*  --  298*  --  190*  --   --   --   --   --  139*  ALKPHOS 598*  --  546*  --   --   --  462*  --   --   --   --   --  421*  BILITOT 4.7*  --  5.9*  --   --   --  4.0*  --   --   --   --   --  3.2*  PROT 5.5*  --  6.2*  --   --   --  6.2*  --   --   --   --   --  6.5  ALBUMIN 3.1*  < > 3.7  < > 4.3  < > 3.5  < > 3.5 3.9 3.7 3.8 4.0  4.0  < > = values in this interval not displayed. No results for input(s): LIPASE, AMYLASE in the last 168 hours. No results for  input(s): AMMONIA in the last 168 hours.  CBC:  Recent Labs Lab 04/17/17 0452 04/18/17 0505 04/19/17 0545 04/21/17 0500 04/22/17 0429 04/23/17 0437  WBC 30.8* 27.9* 18.8* 13.0* 10.8 9.9  NEUTROABS 28.0* 25.7* 15.0*  --   --   --   HGB 7.6* 7.4* 7.5* 7.5* 7.6* 7.4*  HCT 23.1* 22.2* 23.1* 23.1* 23.0* 22.6*  MCV 92.8 94.3 96.4 97.4 97.9 97.4  PLT 42* 47* 53* 58* 59* 73*    Cardiac Enzymes: No results for input(s): CKTOTAL, CKMB, CKMBINDEX, TROPONINI in the last 168 hours.  BNP: Invalid input(s): POCBNP  CBG:  Recent Labs Lab 04/21/17 2352 04/22/17 1148 04/22/17 1604 04/22/17 2347 04/23/17 0843  GLUCAP 136* 124* 86 121* 114*    Microbiology: Results for orders placed or performed during the hospital encounter of 04/08/17  Culture, blood (routine x 2)     Status: None   Collection Time: 04/08/17  2:04 AM  Result Value Ref Range Status   Specimen Description BLOOD RIGHT ANTECUBITAL  Final   Special Requests   Final    BOTTLES DRAWN AEROBIC AND ANAEROBIC Blood Culture adequate volume   Culture NO GROWTH 5 DAYS  Final   Report Status 04/13/2017 FINAL  Final  Culture, blood (routine x 2)     Status: None   Collection Time: 04/08/17  2:06 AM  Result Value Ref Range Status   Specimen Description BLOOD LEFT ANTECUBITAL  Final   Special Requests   Final     BOTTLES DRAWN AEROBIC AND ANAEROBIC Blood Culture adequate volume   Culture NO GROWTH 5 DAYS  Final   Report Status 04/13/2017 FINAL  Final  C difficile quick scan w PCR reflex     Status: None   Collection Time: 04/08/17  5:09 AM  Result Value Ref Range Status   C Diff antigen NEGATIVE NEGATIVE Final   C Diff toxin NEGATIVE NEGATIVE Final   C Diff interpretation No C. difficile detected.  Final  MRSA PCR Screening     Status: None   Collection Time: 04/08/17  6:50 AM  Result Value Ref Range Status   MRSA by PCR NEGATIVE NEGATIVE Final    Comment:        The GeneXpert MRSA Assay (FDA approved for NASAL specimens only), is one component of a comprehensive MRSA colonization surveillance program. It is not intended to diagnose MRSA infection nor to guide or monitor treatment for MRSA infections.   Urine culture     Status: None   Collection Time: 04/08/17 12:16 PM  Result Value Ref Range Status   Specimen Description URINE, RANDOM  Final   Special Requests NONE  Final   Culture   Final    NO GROWTH Performed at [facility], [address] [place], [place] [address]    Report Status 04/10/2017 FINAL  Final  Aerobic Culture (superficial specimen)     Status: None   Collection Time: 04/09/17 11:32 AM  Result Value Ref Range Status   Specimen Description SKIN  Final   Special Requests NONE  Final   Gram Stain   Final    RARE WBC PRESENT, PREDOMINANTLY PMN NO SQUAMOUS EPITHELIAL CELLS SEEN NO ORGANISMS SEEN    Culture   Final    NO GROWTH 2 DAYS Performed at [facility], [place] [address] [place], [place] [address]    Report Status 04/11/2017 FINAL  Final  Gastrointestinal Panel by PCR , Stool     Status: None  Collection Time: 04/09/17  5:10 PM  Result Value Ref Range Status   Campylobacter species NOT DETECTED NOT DETECTED Final   Plesimonas shigelloides NOT DETECTED NOT DETECTED Final   Salmonella species NOT DETECTED NOT DETECTED Final   Yersinia  enterocolitica NOT DETECTED NOT DETECTED Final   Vibrio species NOT DETECTED NOT DETECTED Final   Vibrio cholerae NOT DETECTED NOT DETECTED Final   Enteroaggregative E coli (EAEC) NOT DETECTED NOT DETECTED Final   Enteropathogenic E coli (EPEC) NOT DETECTED NOT DETECTED Final   Enterotoxigenic E coli (ETEC) NOT DETECTED NOT DETECTED Final   Shiga like toxin producing E coli (STEC) NOT DETECTED NOT DETECTED Final   Shigella/Enteroinvasive E coli (EIEC) NOT DETECTED NOT DETECTED Final   Cryptosporidium NOT DETECTED NOT DETECTED Final   Cyclospora cayetanensis NOT DETECTED NOT DETECTED Final   Entamoeba histolytica NOT DETECTED NOT DETECTED Final   Giardia lamblia NOT DETECTED NOT DETECTED Final   Adenovirus F40/41 NOT DETECTED NOT DETECTED Final   Astrovirus NOT DETECTED NOT DETECTED Final   Norovirus GI/GII NOT DETECTED NOT DETECTED Final   Rotavirus A NOT DETECTED NOT DETECTED Final   Sapovirus (I, II, IV, and V) NOT DETECTED NOT DETECTED Final  Culture, respiratory (NON-Expectorated)     Status: None   Collection Time: 04/12/17 12:00 PM  Result Value Ref Range Status   Specimen Description TRACHEAL ASPIRATE  Final   Special Requests NONE  Final   Gram Stain   Final    MODERATE WBC PRESENT,BOTH PMN AND MONONUCLEAR MODERATE YEAST Performed at [facility], [address], [place], [place] [address]    Culture MODERATE CANDIDA TROPICALIS  Final   Report Status 04/14/2017 FINAL  Final  Culture, respiratory (NON-Expectorated)     Status: None   Collection Time: 04/15/17  7:48 AM  Result Value Ref Range Status   Specimen Description TRACHEAL ASPIRATE  Final   Special Requests Normal  Final   Gram Stain   Final    RARE WBC PRESENT, PREDOMINANTLY PMN FEW BUDDING YEAST SEEN Performed at [facility], [place] [address] [place], [place] [address]    Culture ABUNDANT CANDIDA TROPICALIS  Final   Report Status 04/18/2017 FINAL  Final  CULTURE, BLOOD (ROUTINE X 2) w Reflex to ID  Panel     Status: None   Collection Time: 04/15/17 10:46 AM  Result Value Ref Range Status   Specimen Description BLOOD LEFT SUBCLAVIAN TRIPLE LUMEN  Final   Special Requests   Final    BOTTLES DRAWN AEROBIC AND ANAEROBIC Blood Culture results may not be optimal due to an excessive volume of blood received in culture bottles   Culture NO GROWTH 5 DAYS  Final   Report Status 04/20/2017 FINAL  Final  CULTURE, BLOOD (ROUTINE X 2) w Reflex to ID Panel     Status: None   Collection Time: 04/16/17  1:21 PM  Result Value Ref Range Status   Specimen Description BLOOD BLOOD LEFT HAND  Final   Special Requests   Final    BOTTLES DRAWN AEROBIC AND ANAEROBIC Blood Culture results may not be optimal due to an inadequate volume of blood received in culture bottles   Culture NO GROWTH 5 DAYS  Final   Report Status 04/21/2017 FINAL  Final  CULTURE, BLOOD (ROUTINE X 2) w Reflex to ID Panel     Status: None   Collection Time: 04/16/17  1:27 PM  Result Value Ref Range Status   Specimen Description  BLOOD A-LINE DRAW  Final   Special Requests   Final    BOTTLES DRAWN AEROBIC AND ANAEROBIC Blood Culture adequate volume   Culture NO GROWTH 5 DAYS  Final   Report Status 04/21/2017 FINAL  Final  C difficile quick scan w PCR reflex     Status: None   Collection Time: 04/16/17 10:15 PM  Result Value Ref Range Status   C Diff antigen NEGATIVE NEGATIVE Final   C Diff toxin NEGATIVE NEGATIVE Final   C Diff interpretation No C. difficile detected.  Final    Comment: VALID    Coagulation Studies: No results for input(s): LABPROT, INR in the last 72 hours.  Urinalysis: No results for input(s): COLORURINE, LABSPEC, PHURINE, GLUCOSEU, HGBUR, BILIRUBINUR, KETONESUR, PROTEINUR, UROBILINOGEN, NITRITE, LEUKOCYTESUR in the last 72 hours.  Invalid input(s): APPERANCEUR    Imaging: No results found.   Medications:   . albumin human    . feeding supplement (VITAL AF 1.2 CAL) 1,000 mL (04/23/17 1006)  .  norepinephrine (LEVOPHED) Adult infusion 5 mcg/min (04/23/17 0844)  . pureflow 3 each (04/23/17 1037)   . amiodarone  400 mg Per Tube BID  . feeding supplement (ENSURE ENLIVE)  237 mL Oral TID BM  . levothyroxine  75 mcg Per Tube QAC breakfast  . mouth rinse  15 mL Mouth Rinse BID  . QUEtiapine  25 mg Per Tube q morning - 10a  . QUEtiapine  25 mg Oral QHS  . silver sulfADIAZINE   Topical BID   artificial tears, heparin, ipratropium-albuterol, LORazepam, metoprolol tartrate, morphine injection, [DISCONTINUED] ondansetron **OR** ondansetron (ZOFRAN) IV, phenol, sodium chloride flush, zinc oxide  Assessment/ Plan:  Ms. Neeta Storey is a 67 y.o. white female  with hypertension, hypothyroidism, atrial fibrillation, who was admitted to [facility] on 04/08/2017 for evaluation of increasing lower extremity edema.  Hospital course: Admitted on 04/08/2017 with significant peripheral edema and cellulitis. Started on CRRT for hypotension and anuric renal failure on 7/2. Patient weaned off CRRT on 7/4. However later that day, patient had cardiac arrest and coded. Intubated, sedated and restarted on three vasopressors. Restart on CRRT on 7/4.  2-D echo which shows EF 25-30%  1. Acute renal failure with proteinuria and hematuria: baseline creatinine of 0.62 09/2014.  Patient with massive peripheral edema, anasarca and hypoalbuminemia.  Recent IV contrast exposure on 04/04/17.  - Serologic work up: compliments normal, negative SPEP/UPEP, ANA negative, ANCA negative, GBM negative, negative hepatitis -  We will plan to continue CRRT at this time, continue ultrafiltration target of 180 cc per hour. If blood pressure stabilizes we could consider intermittent dialysis.   2. 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 Echocardiogram with EF of 25-30% - Patient was net negative only 434 cc yesterday as there was  significant volume input.  3. Acute respiratory failure Extubated 7/9 - Remains off the ventilator at this point in time.  4. 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 - Management of blood sugars as per hospitalist and pulmonary/critical care   LOS: 15 Leo Weyandt 7/16/201810:48 AM

## 2017-04-23 NOTE — Progress Notes (Signed)
PT Cancellation Note  Patient Details Name: [name] MRN: [id number] DOB: 06-19-1951   Cancelled Treatment:    Reason Eval/Treat Not Completed: Medical issues which prohibited therapy (Patient remains on CRRT with persistent hypotension (requiring re-initiation of pressors).  PT remains contraindicated at this time.  Will attempt one additional date; if remains medically inappropriate, will discontinue order.)   Geneve Kimpel H. Owens Shark, PT, DPT, NCS 04/23/17, 1:03 PM [phone]

## 2017-04-23 NOTE — Progress Notes (Signed)
ID sign off note WBC down, no fevers. Off abx for 7 days. Will sign off - please call if questions.

## 2017-04-23 NOTE — Plan of Care (Signed)
Problem: Fluid Volume: Goal: Ability to maintain a balanced intake and output will improve Outcome: Not Progressing Patient remains on CRRT, due to inability to make urine. Patient continues to refuse to take much by mouth, remains on tube feeds to meet nutritional needs.

## 2017-04-24 DIAGNOSIS — I482 Chronic atrial fibrillation: Secondary | ICD-10-CM

## 2017-04-24 LAB — GLUCOSE, CAPILLARY
Glucose-Capillary: 115 mg/dL — ABNORMAL HIGH (ref 65–99)
Glucose-Capillary: 116 mg/dL — ABNORMAL HIGH (ref 65–99)
Glucose-Capillary: 120 mg/dL — ABNORMAL HIGH (ref 65–99)
Glucose-Capillary: 120 mg/dL — ABNORMAL HIGH (ref 65–99)

## 2017-04-24 LAB — RENAL FUNCTION PANEL
ANION GAP: 7 (ref 5–15)
ANION GAP: 10 (ref 5–15)
Albumin: 4.1 g/dL (ref 3.5–5.0)
Albumin: 4 g/dL (ref 3.5–5.0)
Albumin: 3.8 g/dL (ref 3.5–5.0)
Albumin: 3.8 g/dL (ref 3.5–5.0)
Anion gap: 8 (ref 5–15)
Anion gap: 8 (ref 5–15)
BUN: 42 mg/dL — ABNORMAL HIGH (ref 6–20)
BUN: 46 mg/dL — ABNORMAL HIGH (ref 6–20)
BUN: 56 mg/dL — AB (ref 6–20)
BUN: 51 mg/dL — AB (ref 6–20)
CALCIUM: 8.6 mg/dL — AB (ref 8.9–10.3)
CALCIUM: 8.9 mg/dL (ref 8.9–10.3)
CALCIUM: 8.4 mg/dL — AB (ref 8.9–10.3)
CHLORIDE: 100 mmol/L — AB (ref 101–111)
CO2: 27 mmol/L (ref 22–32)
CO2: 27 mmol/L (ref 22–32)
CO2: 26 mmol/L (ref 22–32)
CO2: 27 mmol/L (ref 22–32)
CREATININE: 0.86 mg/dL (ref 0.44–1.00)
CREATININE: 1.02 mg/dL — AB (ref 0.44–1.00)
Calcium: 8.4 mg/dL — ABNORMAL LOW (ref 8.9–10.3)
Chloride: 101 mmol/L (ref 101–111)
Chloride: 101 mmol/L (ref 101–111)
Chloride: 100 mmol/L — ABNORMAL LOW (ref 101–111)
Creatinine, Ser: 0.98 mg/dL (ref 0.44–1.00)
Creatinine, Ser: 0.96 mg/dL (ref 0.44–1.00)
GFR calc Af Amer: >60 mL/min (ref >60)
GFR calc non Af Amer: 59 mL/min — ABNORMAL LOW (ref >60)
GFR calc non Af Amer: 56 mL/min — ABNORMAL LOW (ref >60)
GFR calc non Af Amer: >60 mL/min (ref >60)
GLUCOSE: 108 mg/dL — AB (ref 65–99)
GLUCOSE: 112 mg/dL — AB (ref 65–99)
Glucose, Bld: 103 mg/dL — ABNORMAL HIGH (ref 65–99)
Glucose, Bld: 121 mg/dL — ABNORMAL HIGH (ref 65–99)
PHOSPHORUS: 1.8 mg/dL — AB (ref 2.5–4.6)
POTASSIUM: 4.2 mmol/L (ref 3.5–5.1)
Phosphorus: 1.9 mg/dL — ABNORMAL LOW (ref 2.5–4.6)
Phosphorus: 2.4 mg/dL — ABNORMAL LOW (ref 2.5–4.6)
Phosphorus: 2.7 mg/dL (ref 2.5–4.6)
Potassium: 4.6 mmol/L (ref 3.5–5.1)
Potassium: 4.9 mmol/L (ref 3.5–5.1)
Potassium: 4.7 mmol/L (ref 3.5–5.1)
SODIUM: 135 mmol/L (ref 135–145)
SODIUM: 138 mmol/L (ref 135–145)
SODIUM: 134 mmol/L — AB (ref 135–145)
Sodium: 135 mmol/L (ref 135–145)

## 2017-04-24 LAB — MAGNESIUM
MAGNESIUM: 10 mg/dL — AB (ref 1.7–2.4)
MAGNESIUM: 3 mg/dL — AB (ref 1.7–2.4)
MAGNESIUM: 2 mg/dL (ref 1.7–2.4)
Magnesium: 2.3 mg/dL (ref 1.7–2.4)

## 2017-04-24 LAB — APTT: aPTT: 36 seconds (ref 24–36)

## 2017-04-24 MED ORDER — RENA-VITE PO TABS
1.0000 | ORAL_TABLET | Freq: Every day | ORAL | Status: DC
  Administered 2017-04-24 – 2017-05-06 (×13): 1 via ORAL
  Filled 2017-04-24 (×13): qty 1

## 2017-04-24 MED ORDER — STERILE WATER FOR INJECTION IJ SOLN
INTRAMUSCULAR | Status: AC
  Administered 2017-04-24: 07:00:00
  Filled 2017-04-24: qty 10

## 2017-04-24 MED ORDER — ALTEPLASE 2 MG IJ SOLR
2.0000 mg | Freq: Once | INTRAMUSCULAR | Status: AC
  Administered 2017-04-24: 2 mg
  Filled 2017-04-24: qty 2

## 2017-04-24 MED ORDER — CARVEDILOL 3.125 MG PO TABS
3.1250 mg | ORAL_TABLET | Freq: Two times a day (BID) | ORAL | Status: DC
  Administered 2017-04-24 – 2017-04-30 (×11): 3.125 mg
  Filled 2017-04-24 (×13): qty 1

## 2017-04-24 MED ORDER — POTASSIUM & SODIUM PHOSPHATES 280-160-250 MG PO PACK
2.0000 | PACK | Freq: Three times a day (TID) | ORAL | Status: DC
  Administered 2017-04-24 – 2017-04-27 (×9): 2 via ORAL
  Filled 2017-04-24 (×12): qty 2

## 2017-04-24 NOTE — Care Management (Signed)
RNCM met with patient's husband in ICU. I presented both LTAC facilities to him. He is not certain and wishes to speak with MD regarding this information. RNCM will continue to follow.

## 2017-04-24 NOTE — Plan of Care (Signed)
Problem: Education: Goal: Knowledge of [facility] Education information/materials will improve Outcome: Completed/Met Date Met: 04/24/17 Patient's husband educated

## 2017-04-24 NOTE — Progress Notes (Signed)
[place] for Electrolyte Replacement  Indication: CRRT   66 yo female receiving CRRT. Pharmacy consulted for electrolyte monitoring.   Plan:  Will f/u AM labs.   No Known Allergies  Patient Measurements: Height: 5\' 5"  (165.1 cm) Weight: 177 lb 7.5 oz (80.5 kg) IBW/kg (Calculated) : 57 Vital Signs: Temp: 97.5 F (36.4 C) (07/17 1600) Temp Source: Axillary (07/17 1600) BP: 87/57 (07/17 1600) Pulse Rate: 102 (07/17 1600) Intake/Output from previous day: 07/16 0701 - 07/17 0700 In: 1064.4 [I.V.:161.5; NG/GT:752.9; IV Piggyback:150] Out: 3950  Intake/Output from this shift: Total I/O In: 1827.1 [I.V.:75; NG/GT:1702.1; IV Piggyback:50] Out: 590 [Other:590]  Labs:  Recent Labs  04/22/17 0429  04/23/17 0437  04/23/17 2100 04/24/17 0456 04/24/17 1241 04/24/17 1452  WBC 10.8  --  9.9  --   --   --   --   --   HGB 7.6*  --  7.4*  --   --   --   --   --   HCT 23.0*  --  22.6*  --   --   --   --   --   PLT 59*  --  73*  --   --   --   --   --   APTT 32  --  34  --   --  36  --   --   CREATININE  --   < > 0.77  < > 0.86 0.98 1.02*  --   MG 2.2  < > 1.8  < > 2.3 2.3 10.0* 3.0*  PHOS  --   < > 2.3*  < > 1.8* 1.9* 2.4*  --   ALBUMIN  --   < > 4.0  4.0  < > 4.1 4.0 3.8  --   PROT  --   --  6.5  --   --   --   --   --   AST  --   --  83*  --   --   --   --   --   ALT  --   --  139*  --   --   --   --   --   ALKPHOS  --   --  421*  --   --   --   --   --   BILITOT  --   --  3.2*  --   --   --   --   --   BILIDIR  --   --  1.5*  --   --   --   --   --   IBILI  --   --  1.7*  --   --   --   --   --   < > = values in this interval not displayed.  Lab Results  Component Value Date   K 4.7 04/24/2017    Estimated Creatinine Clearance: 57.6 mL/min (A) (by C-G formula based on SCr of 1.02 mg/dL (H)).   Potassium (mmol/L)  Date Value  04/24/2017 4.7  09/19/2014 4.1   Calcium (mg/dL)  Date Value  04/24/2017 8.4 (L)   Calcium, Total (mg/dL)   Date Value  09/19/2014 8.3 (L)   Sodium (mmol/L)  Date Value  04/24/2017 134 (L)  09/19/2014 140    Pharmacy will continue to monitor and adjust per consult.   Ulice Dash, PharmD Clinical Pharmacist  04/24/17 4:26 PM

## 2017-04-24 NOTE — Progress Notes (Addendum)
Reduce UF rate to 100 ml/hr per Dr Holley Raring.  We will attempt intermittent HD tomorrow, and take CRRT down at that time.  Pt may need a different access

## 2017-04-24 NOTE — Progress Notes (Signed)
[facility]  ROUNDING NOTE   Subjective:  Patient had good ultrafiltration of 4.1 L yesterday. Blood pressure overall remains a bit low. We have decided to decrease ultrafiltration target to 2.4 kg over the next 24 hours. Thereafter we will likely consider transitioning the patient to intermittent hemodialysis.  Objective:  Vital signs in last 24 hours:  Temp:  [97.5 F (36.4 C)-100.1 F (37.8 C)] 100.1 F (37.8 C) (07/17 0800) Pulse Rate:  [86-129] 92 (07/17 0900) Resp:  [10-28] 28 (07/17 0900) BP: (75-113)/(58-76) 94/66 (07/17 0900) SpO2:  [89 %-100 %] 90 % (07/17 0900) Weight:  [80.5 kg (177 lb 7.5 oz)] 80.5 kg (177 lb 7.5 oz) (07/17 0500)  Weight change: -0.8 kg (-1 lb 12.2 oz) Filed Weights   04/22/17 0400 04/23/17 0402 04/24/17 0500  Weight: 81.1 kg (178 lb 12.7 oz) 81.3 kg (179 lb 3.7 oz) 80.5 kg (177 lb 7.5 oz)    Intake/Output: I/O last 3 completed shifts: In: 2206.4 [P.O.:60; I.V.:413.4; NG/GT:1532.9; IV Piggyback:200] Out: 5623 [GXQJJ:9417]   Intake/Output this shift:  Total I/O In: 952.1 [I.V.:15; NG/GT:937.1] Out: 0   Physical Exam: General: No acute distress   Head: Normocephalic, atraumatic  Eyes: + jaundice  Neck: supple  Lungs:  Mild crackles at bases, normal effort  Heart: Irregular  Abdomen:  Soft, nontender, obese  Extremities: 2+ bilateral lower extremity edema   Neurologic: Alert, able to follow commands  Skin: Bullous lesions with serous sanginous drainage  Access: Right IJ temp cath 7/12 [name])    Basic Metabolic Panel:  Recent Labs Lab 04/22/17 2147 04/23/17 0437 04/23/17 1428 04/23/17 2100 04/24/17 0456  NA 137 137 135 135 138  K 4.8 4.8 4.5 4.6 4.9  CL 102 100* 100* 101 101  CO2 28 27 27 27 27   GLUCOSE 132* 125* 138* 103* 121*  BUN 39* 39* 41* 42* 46*  CREATININE 0.79 0.77 0.77 0.86 0.98  CALCIUM 8.5* 8.7* 8.8* 8.6* 8.9  MG 1.8 1.8 1.8 2.3 2.3  PHOS 2.6 2.3* 1.9* 1.8* 1.9*    Liver Function  Tests:  Recent Labs Lab 04/18/17 0505  04/19/17 0544  04/21/17 0500  04/22/17 2147 04/23/17 0437 04/23/17 1428 04/23/17 2100 04/24/17 0456  AST 95*  --  83*  --  67*  --   --  83*  --   --   --   ALT 402*  --  298*  --  190*  --   --  139*  --   --   --   ALKPHOS 546*  --   --   --  462*  --   --  421*  --   --   --   BILITOT 5.9*  --   --   --  4.0*  --   --  3.2*  --   --   --   PROT 6.2*  --   --   --  6.2*  --   --  6.5  --   --   --   ALBUMIN 3.7  < > 4.3  < > 3.5  < > 3.8 4.0  4.0 4.1 4.1 4.0  < > = values in this interval not displayed. No results for input(s): LIPASE, AMYLASE in the last 168 hours. No results for input(s): AMMONIA in the last 168 hours.  CBC:  Recent Labs Lab 04/18/17 0505 04/19/17 0545 04/21/17 0500 04/22/17 0429 04/23/17 0437  WBC 27.9* 18.8* 13.0* 10.8 9.9  NEUTROABS 25.7*  15.0*  --   --   --   HGB 7.4* 7.5* 7.5* 7.6* 7.4*  HCT 22.2* 23.1* 23.1* 23.0* 22.6*  MCV 94.3 96.4 97.4 97.9 97.4  PLT 47* 53* 58* 59* 73*    Cardiac Enzymes: No results for input(s): CKTOTAL, CKMB, CKMBINDEX, TROPONINI in the last 168 hours.  BNP: Invalid input(s): POCBNP  CBG:  Recent Labs Lab 04/22/17 2347 04/23/17 0843 04/23/17 1641 04/24/17 0012 04/24/17 0728  GLUCAP 121* 114* 117* 120* 120*    Microbiology: Results for orders placed or performed during the hospital encounter of 04/08/17  Culture, blood (routine x 2)     Status: None   Collection Time: 04/08/17  2:04 AM  Result Value Ref Range Status   Specimen Description BLOOD RIGHT ANTECUBITAL  Final   Special Requests   Final    BOTTLES DRAWN AEROBIC AND ANAEROBIC Blood Culture adequate volume   Culture NO GROWTH 5 DAYS  Final   Report Status 04/13/2017 FINAL  Final  Culture, blood (routine x 2)     Status: None   Collection Time: 04/08/17  2:06 AM  Result Value Ref Range Status   Specimen Description BLOOD LEFT ANTECUBITAL  Final   Special Requests   Final    BOTTLES DRAWN AEROBIC AND  ANAEROBIC Blood Culture adequate volume   Culture NO GROWTH 5 DAYS  Final   Report Status 04/13/2017 FINAL  Final  C difficile quick scan w PCR reflex     Status: None   Collection Time: 04/08/17  5:09 AM  Result Value Ref Range Status   C Diff antigen NEGATIVE NEGATIVE Final   C Diff toxin NEGATIVE NEGATIVE Final   C Diff interpretation No C. difficile detected.  Final  MRSA PCR Screening     Status: None   Collection Time: 04/08/17  6:50 AM  Result Value Ref Range Status   MRSA by PCR NEGATIVE NEGATIVE Final    Comment:        The GeneXpert MRSA Assay (FDA approved for NASAL specimens only), is one component of a comprehensive MRSA colonization surveillance program. It is not intended to diagnose MRSA infection nor to guide or monitor treatment for MRSA infections.   Urine culture     Status: None   Collection Time: 04/08/17 12:16 PM  Result Value Ref Range Status   Specimen Description URINE, RANDOM  Final   Special Requests NONE  Final   Culture   Final    NO GROWTH Performed at [facility], [address], [place], [place] [address]    Report Status 04/10/2017 FINAL  Final  Aerobic Culture (superficial specimen)     Status: None   Collection Time: 04/09/17 11:32 AM  Result Value Ref Range Status   Specimen Description SKIN  Final   Special Requests NONE  Final   Gram Stain   Final    RARE WBC PRESENT, PREDOMINANTLY PMN NO SQUAMOUS EPITHELIAL CELLS SEEN NO ORGANISMS SEEN    Culture   Final    NO GROWTH 2 DAYS Performed at [facility], [place] [address] [place], [place] [address]    Report Status 04/11/2017 FINAL  Final  Gastrointestinal Panel by PCR , Stool     Status: None   Collection Time: 04/09/17  5:10 PM  Result Value Ref Range Status   Campylobacter species NOT DETECTED NOT DETECTED Final   Plesimonas shigelloides NOT DETECTED NOT DETECTED Final   Salmonella species NOT DETECTED NOT DETECTED Final  Yersinia enterocolitica NOT DETECTED  NOT DETECTED Final   Vibrio species NOT DETECTED NOT DETECTED Final   Vibrio cholerae NOT DETECTED NOT DETECTED Final   Enteroaggregative E coli (EAEC) NOT DETECTED NOT DETECTED Final   Enteropathogenic E coli (EPEC) NOT DETECTED NOT DETECTED Final   Enterotoxigenic E coli (ETEC) NOT DETECTED NOT DETECTED Final   Shiga like toxin producing E coli (STEC) NOT DETECTED NOT DETECTED Final   Shigella/Enteroinvasive E coli (EIEC) NOT DETECTED NOT DETECTED Final   Cryptosporidium NOT DETECTED NOT DETECTED Final   Cyclospora cayetanensis NOT DETECTED NOT DETECTED Final   Entamoeba histolytica NOT DETECTED NOT DETECTED Final   Giardia lamblia NOT DETECTED NOT DETECTED Final   Adenovirus F40/41 NOT DETECTED NOT DETECTED Final   Astrovirus NOT DETECTED NOT DETECTED Final   Norovirus GI/GII NOT DETECTED NOT DETECTED Final   Rotavirus A NOT DETECTED NOT DETECTED Final   Sapovirus (I, II, IV, and V) NOT DETECTED NOT DETECTED Final  Culture, respiratory (NON-Expectorated)     Status: None   Collection Time: 04/12/17 12:00 PM  Result Value Ref Range Status   Specimen Description TRACHEAL ASPIRATE  Final   Special Requests NONE  Final   Gram Stain   Final    MODERATE WBC PRESENT,BOTH PMN AND MONONUCLEAR MODERATE YEAST Performed at [facility], [address], [place], [place] [address]    Culture MODERATE CANDIDA TROPICALIS  Final   Report Status 04/14/2017 FINAL  Final  Culture, respiratory (NON-Expectorated)     Status: None   Collection Time: 04/15/17  7:48 AM  Result Value Ref Range Status   Specimen Description TRACHEAL ASPIRATE  Final   Special Requests Normal  Final   Gram Stain   Final    RARE WBC PRESENT, PREDOMINANTLY PMN FEW BUDDING YEAST SEEN Performed at [facility], [place] [address], [place], [place] [address]    Culture ABUNDANT CANDIDA TROPICALIS  Final   Report Status 04/18/2017 FINAL  Final  CULTURE, BLOOD (ROUTINE X 2) w Reflex to ID Panel     Status: None    Collection Time: 04/15/17 10:46 AM  Result Value Ref Range Status   Specimen Description BLOOD LEFT SUBCLAVIAN TRIPLE LUMEN  Final   Special Requests   Final    BOTTLES DRAWN AEROBIC AND ANAEROBIC Blood Culture results may not be optimal due to an excessive volume of blood received in culture bottles   Culture NO GROWTH 5 DAYS  Final   Report Status 04/20/2017 FINAL  Final  CULTURE, BLOOD (ROUTINE X 2) w Reflex to ID Panel     Status: None   Collection Time: 04/16/17  1:21 PM  Result Value Ref Range Status   Specimen Description BLOOD BLOOD LEFT HAND  Final   Special Requests   Final    BOTTLES DRAWN AEROBIC AND ANAEROBIC Blood Culture results may not be optimal due to an inadequate volume of blood received in culture bottles   Culture NO GROWTH 5 DAYS  Final   Report Status 04/21/2017 FINAL  Final  CULTURE, BLOOD (ROUTINE X 2) w Reflex to ID Panel     Status: None   Collection Time: 04/16/17  1:27 PM  Result Value Ref Range Status   Specimen Description BLOOD A-LINE DRAW  Final   Special Requests   Final    BOTTLES DRAWN AEROBIC AND ANAEROBIC Blood Culture adequate volume   Culture NO GROWTH 5 DAYS  Final   Report Status 04/21/2017 FINAL  Final  C difficile quick scan w PCR reflex     Status: None   Collection Time: 04/16/17 10:15 PM  Result Value Ref Range Status   C Diff antigen NEGATIVE NEGATIVE Final   C Diff toxin NEGATIVE NEGATIVE Final   C Diff interpretation No C. difficile detected.  Final    Comment: VALID    Coagulation Studies: No results for input(s): LABPROT, INR in the last 72 hours.  Urinalysis: No results for input(s): COLORURINE, LABSPEC, PHURINE, GLUCOSEU, HGBUR, BILIRUBINUR, KETONESUR, PROTEINUR, UROBILINOGEN, NITRITE, LEUKOCYTESUR in the last 72 hours.  Invalid input(s): APPERANCEUR    Imaging: No results found.   Medications:   . albumin human    . feeding supplement (VITAL AF 1.2 CAL) 1,000 mL (04/24/17 0800)  . norepinephrine (LEVOPHED) Adult  infusion 8 mcg/min (04/24/17 0800)  . pureflow 2,000 mL/hr at 04/23/17 2149   . amiodarone  400 mg Per Tube BID  . famotidine  20 mg Per Tube QHS  . feeding supplement (ENSURE ENLIVE)  237 mL Oral TID BM  . levothyroxine  75 mcg Per Tube QAC breakfast  . mouth rinse  15 mL Mouth Rinse BID  . potassium & sodium phosphates  2 packet Oral TID  . QUEtiapine  25 mg Oral QHS  . QUEtiapine  25 mg Oral Once  . silver sulfADIAZINE   Topical BID   artificial tears, heparin, ipratropium-albuterol, LORazepam, metoprolol tartrate, morphine injection, [DISCONTINUED] ondansetron **OR** ondansetron (ZOFRAN) IV, phenol, sodium chloride flush, zinc oxide  Assessment/ Plan:  [name] is a 66 y.o. white female  with hypertension, hypothyroidism, atrial fibrillation, who was admitted to [facility] on 04/08/2017 for evaluation of increasing lower extremity edema.  Hospital course: Admitted on 04/08/2017 with significant peripheral edema and cellulitis. Started on CRRT for hypotension and anuric renal failure on 7/2. Patient weaned off CRRT on 7/4. However later that day, patient had cardiac arrest and coded. Intubated, sedated and restarted on three vasopressors. Restart on CRRT on 7/4.  2-D echo which shows EF 25-30%  1. Acute renal failure with proteinuria and hematuria: baseline creatinine of 0.62 09/2014.  Patient with massive peripheral edema, anasarca and hypoalbuminemia.  Recent IV contrast exposure on 04/04/17.  - Serologic work up: compliments normal, negative SPEP/UPEP, ANA negative, ANCA negative, GBM negative, negative hepatitis -  We will continue CRRT for 1 additional day. In addition we will request vascular surgery to place PermCath tomorrow. We will likely transition the patient intermittent hemodialysis starting tomorrow or Thursday.   2. 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  Echocardiogram with EF of 25-30% - Continue low-dose pressors to maintain a map of 65 or greater.  3. Acute respiratory failure Extubated 7/9 - Patient continues to do well off of the ventilator.  4. 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 - Management of blood sugars as per hospitalist and pulmonary/critical care  5. Generalized edema. Overall significantly improved with ultrafiltration. We will reduce ultrafiltration target is 2.4 kg as above. Continue to monitor closely.   LOS: Hewitt, [place]on Dunwoody 7/17/201810:14 AM

## 2017-04-24 NOTE — Progress Notes (Signed)
PULMONARY / CRITICAL CARE MEDICINE   Name: [name] MRN: [id number] DOB: 10/06/1951    ADMISSION DATE:  04/08/2017  PT PROFILE:   66 y.o. F with PMH of CAF, hypothyroidism admitted via ED to ICU with many days of progressive LE edema, blistering lesions on BLE, progressive weakness. Adm diagnosis of septic shock, cellulitis, AKI.  Pt PEA arrested 07/4 ACLS protocol initiated ROSC within 2 minutes following interventions intubated during cardiac arrest and extubated 07/9.   MAJOR EVENTS/TEST RESULTS: 07/01 LE venous US: no DVT 07/01 Echocardiogram:  LVEF 25-30% 07/01 Nephrology consultation 07/02 ID consultation 07/02 CRRT initiated 07/03 Off vasopressors. Worsening tachycardia - amiodarone initiated 07/04 Pt PEA arrest ACLS protocol initiated ROSC 2 minutes now back on vasopressors  07/05 Intubated, shock req high dose vasopressors, on CRRT, elevated LFTs c/w congestion/shock liver, pulmonary edema vs ARDS on CXR 07/09 Pt extubated  07/11 remains on CRRT and vasopressors, resp status stable 07/12 remains on CRRT, on amiodarone. Dr Mortimer Fries addressed goals of care with pt's husband. Made DNR/DNI, continue current management without significant escalation. NGT placed for nutrition 07/13 off vasopressors, cont on CRRT with increased UF to 200/hr.  07/14 PCCM/Nephrology agree to continue CRRT through [facility]. 07/15 Increased confusion and intermittent agitation. Quetiapine initiated 07/16 Remains on CRRT, ID signed off   INDWELLING DEVICES:: R fem HD cath 07/02 >>out  ETT 07/04 >>07/09 Left IJ 07/04 >>  R IJ HD cath 07/12 >>   MICRO DATA: MRSA PCR 07/01 >> NEG Urine 07/01 >> NEG Blood 07/01 >> NEG C diff 07/01 >> NEG GI panel 07/02 >> NEG Resp 07/05 >> moderate candida tropicalis  Blood 07/08 >> NEG Blood 07/09 >> NEG Resp 07/08 >> abundant candida tropicalis Cdiff 07/09 >> NEG   ANTIMICROBIALS:  Vanc 07/01 >> 07/02 Pip-tazo 07/01 >> 7/10 Vancomycin 07/6 >>7/10  SUBJECTIVE:  No  acute issues overnight   VITAL SIGNS: BP 107/71   Pulse (!) 129   Temp 98.2 F (36.8 C) (Oral)   Resp (!) 22   Ht 5\' 5"  (1.651 m)   Wt 81.3 kg (179 lb 3.7 oz)   SpO2 93%   BMI 29.83 kg/m   VENTILATOR SETTINGS:    INTAKE / OUTPUT: I/O last 3 completed shifts: In: 3234.4 [P.O.:60; I.V.:421.5; NG/GT:2247.9; IV [id number]:458] Out: 4769 [Other:4769]  PHYSICAL EXAMINATION: General: NAD Neuro: RASS 0, no focal deficits HEENT: NCAT, sclericterus Cardiovascular: IRIR, no murmurs noted Lungs: Clear anteriorly, even, non labored; no wheezes, rales, rhonchi, or crackles  Abdomen: obese, soft, ND, NT  Ext: improved BUE and BLE edema  LABS:  BMET  Recent Labs Lab 04/22/17 2147 04/23/17 0437 04/23/17 1428  NA 137 137 135  K 4.8 4.8 4.5  CL 102 100* 100*  CO2 28 27 27   BUN 39* 39* 41*  CREATININE 0.79 0.77 0.77  GLUCOSE 132* 125* 138*    Electrolytes  Recent Labs Lab 04/22/17 2147 04/23/17 0437 04/23/17 1428 04/23/17 2100  CALCIUM 8.5* 8.7* 8.8*  --   MG 1.8 1.8 1.8 2.3  PHOS 2.6 2.3* 1.9*  --     CBC  Recent Labs Lab 04/21/17 0500 04/22/17 0429 04/23/17 0437  WBC 13.0* 10.8 9.9  HGB 7.5* 7.6* 7.4*  HCT 23.1* 23.0* 22.6*  PLT 58* 59* 73*    Coag's  Recent Labs Lab 04/21/17 0500 04/22/17 0429 04/23/17 0437  APTT 36 32 34    Sepsis Markers  Recent Labs Lab 04/18/17 0505  PROCALCITON 19.49    ABG  Recent Labs Lab 04/19/17 1031  PHART 7.52*  PCO2ART 26*  PO2ART 65*    Liver Enzymes  Recent Labs Lab 04/18/17 0505  04/19/17 0544  04/21/17 0500  04/22/17 2147 04/23/17 0437 04/23/17 1428  AST 95*  --  83*  --  67*  --   --  83*  --   ALT 402*  --  298*  --  190*  --   --  139*  --   ALKPHOS 546*  --   --   --  462*  --   --  421*  --   BILITOT 5.9*  --   --   --  4.0*  --   --  3.2*  --   ALBUMIN 3.7  < > 4.3  < > 3.5  < > 3.8 4.0  4.0 4.1  < > = values in this interval not displayed.  Cardiac Enzymes No results for  input(s): TROPONINI, PROBNP in the last 168 hours.  Glucose  Recent Labs Lab 04/22/17 1148 04/22/17 1604 04/22/17 2347 04/23/17 0843 04/23/17 1641 04/24/17 0012  GLUCAP 124* 86 121* 114* 117* 120*    CXR: NNF  ASSESSMENT / PLAN: Acute hypoxic respiratory failure-slowly resolving Pulmonary edema Severe dilated cardiomyopathy Chronic AF Status post PEA arrest 07/04  Cardiogenic shock, resolved Anuric AKI - likely cardiorenal plus ATN Anasarca, improving Protein-calorie malnutrition Elevated LFTs due to shock liver, hepatic congestion - improving Diarrhea, resolved Anemia without evidence of acute blood loss Thrombocytopenia-improving  Elevated prothrombin time, resolved LE cellulitis - fully treated Severe deconditioning  Plan: Continue supplemental oxygen to maintain O2 sats >92% Continue amiodarone - changed to to enteral 07/14 Continue CRRT per Nephrology once bp stabilizes will consider intermittent dialysis  Monitor BMET intermittently Monitor I/Os Correct electrolytes as indicated DVT px: SCDs Monitor CBC intermittently Transfuse per usual guidelines Continue TF protocol Continue physical therapy  DNR/DNI  Will remain in ICU/SDU as long as on CRRT  [name], [place] Pager [phone] (please enter 7 digits) PCCM Consult Pager [phone] (please enter 7 digits)  PCCM ATTENDING ATTESTATION: I have evaluated patient with the APP [place], reviewed database in its entirety and discussed care plan in detail. In addition, this patient was discussed on multidisciplinary rounds. I agree with the above findings, assessment and plan   Merton Border, MD PCCM service Mobile [phone] Pager [phone] 04/24/2017 4:11 PM

## 2017-04-24 NOTE — Progress Notes (Signed)
Chart reviewed. Visited with patient and husband at bedside. Patient awake and alert. Denies pain or discomfort. Per nephrology, possible transition to intermittent hemodialysis soon. Husband shares stories. Reassured continued support from palliative throughout hospitalization.   NO CHARGE  Ihor Dow, FNP-C Palliative Medicine Team  Phone: [phone] Fax: [phone]

## 2017-04-24 NOTE — Progress Notes (Signed)
Venous catheter clotted. Instilled cath flow .

## 2017-04-24 NOTE — Care Management (Signed)
Plan intermittent HD tomorrow per MD note. I have asked Doroteo Bradford and [place] with both [facility] to review case for LTAC benefits. This is only a screen to see if patient has coverage- no referral has been made. RNCM will manage at direction of family/patient/MD request.

## 2017-04-24 NOTE — Progress Notes (Signed)
Writer spoke with husband re pt has no DVT prophylaxis, is positive for LUE DVT at this time, however, platelets are very low (73 yesterday AM).  At this time we will not anticoagulate

## 2017-04-24 NOTE — Progress Notes (Signed)
Patient ID: [name], female   DOB: 1951-06-09, 66 y.o.   MRN: [id number]     [facility] PROGRESS NOTE  [name] OIN:[id number] DOB: April 17, 1951 DOA: 04/08/2017 PCP: Shannon Harrier, MD  HPI/Subjective: Patient is lethargic, still on Levophed drip. On O2 [place] 2 L. Objective: Vitals:   04/24/17 1400 04/24/17 1500  BP: (!) 84/53 (!) 89/63  Pulse: (!) 104 90  Resp: 12 17  Temp:      Filed Weights   04/22/17 0400 04/23/17 0402 04/24/17 0500  Weight: 178 lb 12.7 oz (81.1 kg) 179 lb 3.7 oz (81.3 kg) 177 lb 7.5 oz (80.5 kg)    ROS: Review of Systems  Unable to perform ROS: Mental acuity  Constitutional: Positive for malaise/fatigue.  Respiratory: Negative for shortness of breath.   Cardiovascular: Positive for leg swelling. Negative for chest pain.  Gastrointestinal: Negative for abdominal pain.  Neurological: Positive for weakness.    Exam: Physical Exam  Constitutional: She appears lethargic.  HENT:  Head: Atraumatic.  Nose: No mucosal edema.  Mouth/Throat: No oropharyngeal exudate or posterior oropharyngeal edema.  Eyes: Pupils are equal, round, and reactive to light. Lids are normal.  Icteric sclera  Neck: Carotid bruit is not present. No thyromegaly present.  Cardiovascular: Regular rhythm, S1 normal, S2 normal and normal heart sounds.   Respiratory: She has decreased breath sounds in the right lower field and the left lower field. She has no wheezes. She has rhonchi in the left lower field. She has no rales.  GI: Soft. Bowel sounds are normal. There is no tenderness.  Musculoskeletal:       Right ankle: She exhibits swelling.       Left ankle: She exhibits swelling.  Lymphadenopathy:    She has no cervical adenopathy.  Neurological: She appears lethargic.  Able to wiggle toes bilateral feet. Able to squeeze hands.  Skin: Skin is warm. No cyanosis.  Left lower extremity covered today Right foot with some blisters that are healing Bruising right neck and  left thigh  Psychiatric: Her affect is blunt.      Data Reviewed: Basic Metabolic Panel:  Recent Labs Lab 04/23/17 0437 04/23/17 1428 04/23/17 2100 04/24/17 0456 04/24/17 1241 04/24/17 1452  NA 137 135 135 138 134*  --   K 4.8 4.5 4.6 4.9 4.7  --   CL 100* 100* 101 101 100*  --   CO2 27 27 27 27 26   --   GLUCOSE 125* 138* 103* 121* 108*  --   BUN 39* 41* 42* 46* 56*  --   CREATININE 0.77 0.77 0.86 0.98 1.02*  --   CALCIUM 8.7* 8.8* 8.6* 8.9 8.4*  --   MG 1.8 1.8 2.3 2.3 10.0* 3.0*  PHOS 2.3* 1.9* 1.8* 1.9* 2.4*  --    Liver Function Tests: CBC:  Recent Labs Lab 04/18/17 0505 04/19/17 0545 04/21/17 0500 04/22/17 0429 04/23/17 0437  WBC 27.9* 18.8* 13.0* 10.8 9.9  NEUTROABS 25.7* 15.0*  --   --   --   HGB 7.4* 7.5* 7.5* 7.6* 7.4*  HCT 22.2* 23.1* 23.1* 23.0* 22.6*  MCV 94.3 96.4 97.4 97.9 97.4  PLT 47* 53* 58* 59* 73*   BNP (last 3 results)  Recent Labs  04/08/17 0139  BNP 2,012.0*     CBG:  Recent Labs Lab 04/22/17 2347 04/23/17 0843 04/23/17 1641 04/24/17 0012 04/24/17 0728  GLUCAP 121* 114* 117* 120* 120*    Recent Results (from the past 240 hour(s))  Culture, respiratory (  NON-Expectorated)     Status: None   Collection Time: 04/15/17  7:48 AM  Result Value Ref Range Status   Specimen Description TRACHEAL ASPIRATE  Final   Special Requests Normal  Final   Gram Stain   Final    RARE WBC PRESENT, PREDOMINANTLY PMN FEW BUDDING YEAST SEEN Performed at [facility], [place] [address], [place], [place] [address]    Culture ABUNDANT CANDIDA TROPICALIS  Final   Report Status 04/18/2017 FINAL  Final  CULTURE, BLOOD (ROUTINE X 2) w Reflex to ID Panel     Status: None   Collection Time: 04/15/17 10:46 AM  Result Value Ref Range Status   Specimen Description BLOOD LEFT SUBCLAVIAN TRIPLE LUMEN  Final   Special Requests   Final    BOTTLES DRAWN AEROBIC AND ANAEROBIC Blood Culture results may not be optimal due to an excessive volume of blood  received in culture bottles   Culture NO GROWTH 5 DAYS  Final   Report Status 04/20/2017 FINAL  Final  CULTURE, BLOOD (ROUTINE X 2) w Reflex to ID Panel     Status: None   Collection Time: 04/16/17  1:21 PM  Result Value Ref Range Status   Specimen Description BLOOD BLOOD LEFT HAND  Final   Special Requests   Final    BOTTLES DRAWN AEROBIC AND ANAEROBIC Blood Culture results may not be optimal due to an inadequate volume of blood received in culture bottles   Culture NO GROWTH 5 DAYS  Final   Report Status 04/21/2017 FINAL  Final  CULTURE, BLOOD (ROUTINE X 2) w Reflex to ID Panel     Status: None   Collection Time: 04/16/17  1:27 PM  Result Value Ref Range Status   Specimen Description BLOOD A-LINE DRAW  Final   Special Requests   Final    BOTTLES DRAWN AEROBIC AND ANAEROBIC Blood Culture adequate volume   Culture NO GROWTH 5 DAYS  Final   Report Status 04/21/2017 FINAL  Final  C difficile quick scan w PCR reflex     Status: None   Collection Time: 04/16/17 10:15 PM  Result Value Ref Range Status   C Diff antigen NEGATIVE NEGATIVE Final   C Diff toxin NEGATIVE NEGATIVE Final   C Diff interpretation No C. difficile detected.  Final    Comment: VALID     Studies: No results found.  Scheduled Meds: . amiodarone  400 mg Per Tube BID  . carvedilol  3.125 mg Per Tube BID WC  . famotidine  20 mg Per Tube QHS  . feeding supplement (ENSURE ENLIVE)  237 mL Oral TID BM  . levothyroxine  75 mcg Per Tube QAC breakfast  . mouth rinse  15 mL Mouth Rinse BID  . multivitamin  1 tablet Oral QHS  . potassium & sodium phosphates  2 packet Oral TID  . QUEtiapine  25 mg Oral QHS  . silver sulfADIAZINE   Topical BID   Continuous Infusions: . albumin human    . feeding supplement (VITAL AF 1.2 CAL) 1,000 mL (04/24/17 1500)  . norepinephrine (LEVOPHED) Adult infusion 8 mcg/min (04/24/17 1500)  . pureflow 2,000 mL/hr at 04/24/17 1144    Assessment/Plan:  1. Acute delirium.  She did not  slept in few nights. Was given a dose of Seroquel. Better. 2. Septic shock with multiorgan failure. Overall prognosis is poor. Patient currently a DO NOT RESUSCITATE. Try to taper off Levophed drip. 3. PEA Cardiopulmonary arrest.  4. Shock  liver from cardiopulmonary arrest. Liver function trending better. Total bilirubin decreased to 3.2. 5. Acute kidney injury. Continue CRRT per Dr. Holley Raring. Oliguria. 6. Acute respiratory failure status post extubation and on nasal cannula. continue O2 [place], NEB. 7. Hypomagnesemia, hypophosphatemia and hypokalemia. On electrolyte protocol 8. Atrial fibrillation with RVR,  continue amiodarone and Lopressor IV when necessary. Pradaxa on hold.  9. Coagulopathy. Could be secondary to shock liver. 10. Diarrhea. resolved  11. Cardiomyopathy on echocardiogram. 12. Anemia and thrombocytopenia. Continue to monitor closely  13. Hypothyroidism unspecified on levothyroxine 14. Depression on Zoloft 15. Nutritional status. Patient with very poor oral intake and unable to keep up with her nutritional needs. Continue [place] tube feeding. Code Status:     Code Status Orders        Start     Ordered   04/08/17 0651  Full code  Continuous     04/08/17 0650    Code Status History    Date Active Date Inactive Code Status Order ID Comments User Context   This patient has a current code status but no historical code status.     Family Communication: Spoke with Husband at bedside Disposition Plan: To be determined based on clinical course  Consultants:  Critical care specialist  Infectious disease  Nephrology  Palliative care  Cardiology  Time spent: 25 minutes.   Demetrios Loll  [employer]

## 2017-04-24 NOTE — Progress Notes (Signed)
PT Cancellation Note  Patient Details Name: [name] MRN: [id number] DOB: 04-13-1951   Cancelled Treatment:    Reason Eval/Treat Not Completed: Medical issues which prohibited therapy (Pt remains on CRRT at this time).  Per chart review, likely transitioning to intermittent dialysis tomorrow 7/18.  Will continue to follow acutely and will attempt to see again tomorrow, if medically appropriate.   Collie Siad PT, DPT 04/24/2017, 12:34 PM

## 2017-04-24 NOTE — Progress Notes (Signed)
CRRT restarted at [place]

## 2017-04-24 NOTE — Progress Notes (Signed)
Magnesium redrawn d/t posted result of 10?  New level is 3.  ELink notified, RN will alert [place]

## 2017-04-24 NOTE — Progress Notes (Signed)
Nutrition Follow-up  DOCUMENTATION CODES:   Obesity unspecified  INTERVENTION:  Continue Vital AF 1.2 at 65 ml/hr via NGT. Goal regimen provides 1872 kcal, 117 grams of protein, 1265 ml H2O daily.  Continue Ensure Enlive po TID between meals, each supplement provides 350 kcal and 20 grams of protein.   Continue Magic cup TID with meals, each supplement provides 290 kcal and 9 grams of protein.   Recommend renal multivitamin with minerals (Rena-vite) QHS to replenish losses from CRRT. Would recommend continuing even with patient is able to transition to intermittent HD.  NUTRITION DIAGNOSIS:   Inadequate oral intake related to poor appetite, other (see comment) (diarrhea) as evidenced by per patient/family report, meal completion < 25%.  Ongoing.  GOAL:   Patient will meet greater than or equal to 90% of their needs  Met with tube feeds.  MONITOR:   PO intake, Supplement acceptance, Labs, Weight trends, Other (Comment) (TF initiation )  REASON FOR ASSESSMENT:   Ventilator, Consult Enteral/tube feeding initiation and management  ASSESSMENT:   66 y.o. white female  with hypertension, hypothyroidism, atrial fibrillation, who was admitted to [facility] on 04/08/2017 for evaluation of increasing lower extremity edema. Admitted on 04/08/2017 with significant peripheral edema and cellulitis. Started on CRRT for hypotension and anuric renal failure on 7/2. Patient weaned off CRRT on 7/4. However later that day, patient had cardiac arrest and coded. Intubated, sedated and restarted on three vasopressors. Restart on CRRT on 7/4.   -Rectal tube was removed 7/12. -Patient remains on CRRT. Per chart plan is to transition to intermittent HD tomorrow. -PMT following patient. Per last charge note on 7/9, family would like to continue full code and full scope of treatment.  Spoke with patient and husband at bedside. Patient is awake, oriented, and able to answer questions appropriately. Noted in  chart review that over the weekend she had been more lethargic. Patient reports she is still not taking much by mouth. Last night she had 1/2 Ensure, one [place] ice, and one bite of mashed potatoes. Today she has only had a sip of Ensure so far. She denies any N/V, abdominal pain, difficulty chewing/swallowing. She reports the reason her PO intake remains poor is that she is afraid she will have worsening diarrhea. Noted patient's bowel movements have been mainly type 6 and type 7 recently. Encouraged PO intake of meals and oral nutrition supplements. Patient would like a baked potato. Discussed that when she is able to take enough PO to stop TF and remove NGT, it will be safe for her to eat more solid foods such as baked potato.  Access: NGT placed 04/19/2017; tip verified to be in mid stomach on same day; marking at nare is 55 cm  TF: pt tolerating goal TF regimen of Vital AF 1.2 at 65 ml/hr via NGT. Provides 1872 kcal, 117 grams of protein, 1265 ml H2O daily.  PO Intake: in the past 24 hrs patient has only had 270 kcal (16% minimum estimated kcal needs), and 10 grams of protein (9% minimum estimated protein needs) from PO intake  Medications reviewed and include: amiodarone, carvedilol, famotidine, levothyroxine, potassium and sodium phosphate 2 packets TID, human albumin 12.5 grams TID IV, norepinephrine gtt.  Labs reviewed: CBG 120, BUN 46, Phosphorus 1.9.   I/O: 3.95 fluid removed past 24 hrs from CRRT, 0 ml UOP, 6 occurrences of stool output  Weight trend: wt trending back down with UF; 80.5 kg 7/17 (-8.5 kg from last nutrition assessment on 7/12); close  to admission weight of 79.5 kg on 7/1  Discussed with RN.  Diet Order:  Diet clear liquid Room service appropriate? Yes with Assist; Fluid consistency: Thin  Skin:  Wound (see comment) (Blisters to BLE)  Last BM:  04/24/2017 - large type 7 per rectum  Height:   Ht Readings from Last 1 Encounters:  04/09/17 '5\' 5"'  (1.651 m)     Weight:   Wt Readings from Last 1 Encounters:  04/24/17 177 lb 7.5 oz (80.5 kg)    Ideal Body Weight:  56.81 kg  BMI:  Body mass index is 29.53 kg/m.  Estimated Nutritional Needs:   Kcal:  1700-2000kcal/day   Protein:  114-125g/day   Fluid:  >1.7L/day or per MD  EDUCATION NEEDS:   Education needs no appropriate at this time  Willey Blade, MS, RD, LDN Pager: [phone] After Hours Pager: [phone]

## 2017-04-25 DIAGNOSIS — D6489 Other specified anemias: Secondary | ICD-10-CM

## 2017-04-25 DIAGNOSIS — I959 Hypotension, unspecified: Secondary | ICD-10-CM

## 2017-04-25 LAB — RENAL FUNCTION PANEL
ALBUMIN: 4.1 g/dL (ref 3.5–5.0)
ALBUMIN: 4.3 g/dL (ref 3.5–5.0)
ANION GAP: 11 (ref 5–15)
Anion gap: 11 (ref 5–15)
BUN: 48 mg/dL — ABNORMAL HIGH (ref 6–20)
BUN: 24 mg/dL — AB (ref 6–20)
CO2: 26 mmol/L (ref 22–32)
CO2: 29 mmol/L (ref 22–32)
Calcium: 8.7 mg/dL — ABNORMAL LOW (ref 8.9–10.3)
Calcium: 8.6 mg/dL — ABNORMAL LOW (ref 8.9–10.3)
Chloride: 101 mmol/L (ref 101–111)
Chloride: 100 mmol/L — ABNORMAL LOW (ref 101–111)
Creatinine, Ser: 1.03 mg/dL — ABNORMAL HIGH (ref 0.44–1.00)
Creatinine, Ser: 0.55 mg/dL (ref 0.44–1.00)
GFR calc Af Amer: >60 mL/min (ref >60)
GFR calc non Af Amer: >60 mL/min (ref >60)
GFR, EST NON AFRICAN AMERICAN: 56 mL/min — AB (ref >60)
GLUCOSE: 98 mg/dL (ref 65–99)
GLUCOSE: 110 mg/dL — AB (ref 65–99)
PHOSPHORUS: 3.3 mg/dL (ref 2.5–4.6)
POTASSIUM: 4.4 mmol/L (ref 3.5–5.1)
POTASSIUM: 4 mmol/L (ref 3.5–5.1)
Phosphorus: 2.8 mg/dL (ref 2.5–4.6)
Sodium: 138 mmol/L (ref 135–145)
Sodium: 140 mmol/L (ref 135–145)

## 2017-04-25 LAB — CBC
HEMATOCRIT: 20.6 % — AB (ref 35.0–47.0)
HEMOGLOBIN: 6.8 g/dL — AB (ref 12.0–16.0)
MCH: 32.5 pg (ref 26.0–34.0)
MCHC: 33 g/dL (ref 32.0–36.0)
MCV: 98.4 fL (ref 80.0–100.0)
Platelets: 66 10*3/uL — ABNORMAL LOW (ref 150–440)
RBC: 2.09 MIL/uL — AB (ref 3.80–5.20)
RDW: 24.6 % — ABNORMAL HIGH (ref 11.5–14.5)
WBC: 8.6 10*3/uL (ref 3.6–11.0)

## 2017-04-25 LAB — HEPATIC FUNCTION PANEL
ALBUMIN: 4.1 g/dL (ref 3.5–5.0)
ALK PHOS: 322 U/L — AB (ref 38–126)
ALT: 93 U/L — AB (ref 14–54)
AST: 66 U/L — AB (ref 15–41)
BILIRUBIN TOTAL: 2.9 mg/dL — AB (ref 0.3–1.2)
Bilirubin, Direct: 1.3 mg/dL — ABNORMAL HIGH (ref 0.1–0.5)
Indirect Bilirubin: 1.6 mg/dL — ABNORMAL HIGH (ref 0.3–0.9)
Total Protein: 6.3 g/dL — ABNORMAL LOW (ref 6.5–8.1)

## 2017-04-25 LAB — GLUCOSE, CAPILLARY
Glucose-Capillary: 99 mg/dL (ref 65–99)
Glucose-Capillary: 106 mg/dL — ABNORMAL HIGH (ref 65–99)

## 2017-04-25 LAB — APTT: aPTT: 26 seconds (ref 24–36)

## 2017-04-25 LAB — MAGNESIUM
Magnesium: 1.9 mg/dL (ref 1.7–2.4)
Magnesium: 1.8 mg/dL (ref 1.7–2.4)

## 2017-04-25 LAB — PREPARE RBC (CROSSMATCH)

## 2017-04-25 MED ORDER — AMIODARONE HCL 200 MG PO TABS
200.0000 mg | ORAL_TABLET | Freq: Two times a day (BID) | ORAL | Status: DC
  Administered 2017-04-25 – 2017-04-30 (×10): 200 mg
  Filled 2017-04-25 (×10): qty 1

## 2017-04-25 MED ORDER — QUETIAPINE FUMARATE 25 MG PO TABS
25.0000 mg | ORAL_TABLET | Freq: Every day | ORAL | Status: DC
  Administered 2017-04-25 – 2017-04-29 (×5): 25 mg
  Filled 2017-04-25 (×5): qty 1

## 2017-04-25 MED ORDER — LEVOTHYROXINE SODIUM 100 MCG PO TABS
100.0000 ug | ORAL_TABLET | Freq: Every day | ORAL | Status: DC
  Administered 2017-04-26 – 2017-04-30 (×5): 100 ug
  Filled 2017-04-25 (×5): qty 1

## 2017-04-25 MED ORDER — MIDODRINE HCL 2.5 MG PO TABS
5.0000 mg | ORAL_TABLET | Freq: Two times a day (BID) | ORAL | Status: DC
  Administered 2017-04-25 – 2017-04-26 (×2): 5 mg via ORAL
  Filled 2017-04-25: qty 2
  Filled 2017-04-25: qty 1

## 2017-04-25 MED ORDER — SODIUM CHLORIDE 0.9 % IV SOLN
Freq: Once | INTRAVENOUS | Status: AC
  Administered 2017-04-25: 16:00:00 via INTRAVENOUS

## 2017-04-25 NOTE — Progress Notes (Signed)
Hd start 

## 2017-04-25 NOTE — Progress Notes (Signed)
Post hd vitals 

## 2017-04-25 NOTE — Progress Notes (Signed)
We have been asked to place a permacath for the patient's dialysis access.  Today her hemoglobin is down and now is 6.8 her platelet count is down at 66. At this time we will put a hold on the permacath placement until her platelet count is greater than 70 and her hemoglobin is approximately 8 or better

## 2017-04-25 NOTE — Progress Notes (Signed)
   Pre hd info, bp too low to start hd, primary rn aware

## 2017-04-25 NOTE — Progress Notes (Signed)
Post hd assessment 

## 2017-04-25 NOTE — Progress Notes (Signed)
[place] for Electrolyte Replacement  Indication: CRRT   66 yo female receiving CRRT. Pharmacy consulted for electrolyte monitoring.   Plan:  Will f/u AM labs.   No Known Allergies  Patient Measurements: Height: 5\' 5"  (165.1 cm) Weight: 177 lb 0.5 oz (80.3 kg) IBW/kg (Calculated) : 57 Vital Signs: Temp: 92.8 F (33.8 C) (07/18 1600) Temp Source: Rectal (07/18 1545) BP: 95/67 (07/18 1600) Pulse Rate: 91 (07/18 1600) Intake/Output from previous day: 07/17 0701 - 07/18 0700 In: 2006 [I.V.:188.9; NG/GT:1767.1; IV Piggyback:50] Out: 2029  Intake/Output from this shift: Total I/O In: 1096.5 [I.V.:78.6; NG/GT:967.9; IV Piggyback:50] Out: 994 [Other:994]  Labs:  Recent Labs  04/23/17 0437  04/24/17 0456 04/24/17 1241 04/24/17 1452 04/24/17 2215 04/25/17 0441  WBC 9.9  --   --   --   --   --  8.6  HGB 7.4*  --   --   --   --   --  6.8*  HCT 22.6*  --   --   --   --   --  20.6*  PLT 73*  --   --   --   --   --  66*  APTT 34  --  36  --   --   --  26  CREATININE 0.77  < > 0.98 1.02*  --  0.96 1.03*  MG 1.8  < > 2.3 10.0* 3.0* 2.0 1.9  PHOS 2.3*  < > 1.9* 2.4*  --  2.7 3.3  ALBUMIN 4.0  4.0  < > 4.0 3.8  --  3.8 4.1  4.1  PROT 6.5  --   --   --   --   --  6.3*  AST 83*  --   --   --   --   --  66*  ALT 139*  --   --   --   --   --  93*  ALKPHOS 421*  --   --   --   --   --  322*  BILITOT 3.2*  --   --   --   --   --  2.9*  BILIDIR 1.5*  --   --   --   --   --  1.3*  IBILI 1.7*  --   --   --   --   --  1.6*  < > = values in this interval not displayed.  Lab Results  Component Value Date   K 4.4 04/25/2017    Estimated Creatinine Clearance: 57 mL/min (A) (by C-G formula based on SCr of 1.03 mg/dL (H)).   Potassium (mmol/L)  Date Value  04/25/2017 4.4  09/19/2014 4.1   Calcium (mg/dL)  Date Value  04/25/2017 8.7 (L)   Calcium, Total (mg/dL)  Date Value  09/19/2014 8.3 (L)   Sodium (mmol/L)  Date Value  04/25/2017 138   09/19/2014 140    Pharmacy will continue to monitor and adjust per consult.   MLS 04/25/17 4:54 PM

## 2017-04-25 NOTE — Progress Notes (Signed)
PULMONARY / CRITICAL CARE MEDICINE   Name: [name] MRN: [id number] DOB: 1951-02-22    ADMISSION DATE:  04/08/2017  PT PROFILE:   66 y.o. F with PMH of CAF, hypothyroidism admitted via ED to ICU with many days of progressive LE edema, blistering lesions on BLE, progressive weakness. Adm diagnosis of septic shock, cellulitis, AKI.  Pt PEA arrested 07/4 ACLS protocol initiated ROSC within 2 minutes following interventions intubated during cardiac arrest and extubated 07/9.   MAJOR EVENTS/TEST RESULTS: 07/01 LE venous US: no DVT 07/01 Echocardiogram:  LVEF 25-30% 07/01 Nephrology consultation 07/02 ID consultation 07/02 CRRT initiated 07/03 Off vasopressors. Worsening tachycardia - amiodarone initiated 07/04 Pt PEA arrest ACLS protocol initiated ROSC 2 minutes now back on vasopressors  07/05 Intubated, shock req high dose vasopressors, on CRRT, elevated LFTs c/w congestion/shock liver, pulmonary edema vs ARDS on CXR 07/09 Pt extubated  07/11 remains on CRRT and vasopressors, resp status stable 07/12 remains on CRRT, on amiodarone. Dr Mortimer Fries addressed goals of care with pt's husband. Made DNR/DNI, continue current management without significant escalation. NGT placed for nutrition 07/13 off vasopressors, cont on CRRT with increased UF to 200/hr.  07/14 PCCM/Nephrology agree to continue CRRT through [facility]. 07/15 Increased confusion and intermittent agitation. Quetiapine initiated 07/16 Remains on CRRT, ID signed off  07/18 CRRT stopped pt transitioned to HD   INDWELLING DEVICES:: R fem HD cath 07/02 >>out  ETT 07/04 >>07/09 Left IJ 07/04 >>  R IJ HD cath 07/12 >>   MICRO DATA: MRSA PCR 07/01 >> NEG Urine 07/01 >> NEG Blood 07/01 >> NEG C diff 07/01 >> NEG GI panel 07/02 >> NEG Resp 07/05 >> moderate candida tropicalis  Blood 07/08 >> NEG Blood 07/09 >> NEG Resp 07/08 >> abundant candida tropicalis Cdiff 07/09 >> NEG   ANTIMICROBIALS:  Vanc 07/01 >> 07/02 Pip-tazo 07/01 >>  7/10 Vancomycin 07/6 >>7/10  SUBJECTIVE:  No acute issues overnight   VITAL SIGNS: BP 91/61   Pulse 87   Temp 98.6 F (37 C)   Resp 18   Ht 5\' 5"  (1.651 m)   Wt 80.3 kg (177 lb 0.5 oz)   SpO2 92%   BMI 29.46 kg/m   VENTILATOR SETTINGS:    INTAKE / OUTPUT: I/O last 3 completed shifts: In: 2072.7 [I.V.:255.6; NG/GT:1767.1; IV Piggyback:50] Out: 3922 [Other:3922]  PHYSICAL EXAMINATION: General: NAD Neuro: RASS 0, no focal deficits HEENT: NCAT, sclericterus Cardiovascular: IRIR, no murmurs noted Lungs: Clear anteriorly, even, non labored; no wheezes, rales, rhonchi, or crackles  Abdomen: obese, soft, ND, NT  Ext: improved BUE and BLE edema  LABS:  BMET  Recent Labs Lab 04/24/17 1241 04/24/17 2215 04/25/17 0441  NA 134* 135 138  K 4.7 4.2 4.4  CL 100* 100* 101  CO2 26 27 26   BUN 56* 51* 48*  CREATININE 1.02* 0.96 1.03*  GLUCOSE 108* 112* 98    Electrolytes  Recent Labs Lab 04/24/17 1241 04/24/17 1452 04/24/17 2215 04/25/17 0441  CALCIUM 8.4*  --  8.4* 8.7*  MG 10.0* 3.0* 2.0 1.9  PHOS 2.4*  --  2.7 3.3    CBC  Recent Labs Lab 04/22/17 0429 04/23/17 0437 04/25/17 0441  WBC 10.8 9.9 8.6  HGB 7.6* 7.4* 6.8*  HCT 23.0* 22.6* 20.6*  PLT 59* 73* 66*    Coag's  Recent Labs Lab 04/23/17 0437 04/24/17 0456 04/25/17 0441  APTT 34 36 26    Sepsis Markers No results for input(s): LATICACIDVEN, PROCALCITON, O2SATVEN in the last  168 hours.  ABG  Recent Labs Lab 04/19/17 1031  PHART 7.52*  PCO2ART 26*  PO2ART 65*    Liver Enzymes  Recent Labs Lab 04/21/17 0500  04/23/17 0437  04/24/17 1241 04/24/17 2215 04/25/17 0441  AST 67*  --  83*  --   --   --  66*  ALT 190*  --  139*  --   --   --  93*  ALKPHOS 462*  --  421*  --   --   --  322*  BILITOT 4.0*  --  3.2*  --   --   --  2.9*  ALBUMIN 3.5  < > 4.0  4.0  < > 3.8 3.8 4.1  4.1  < > = values in this interval not displayed.  Cardiac Enzymes No results for input(s):  TROPONINI, PROBNP in the last 168 hours.  Glucose  Recent Labs Lab 04/23/17 1641 04/24/17 0012 04/24/17 0728 04/24/17 1600 04/24/17 2338 04/25/17 0746  GLUCAP 117* 120* 120* 116* 115* 99    CXR: NNF  ASSESSMENT / PLAN: Acute hypoxic respiratory failure-slowly resolving Pulmonary edema Severe dilated cardiomyopathy Chronic AF Status post PEA arrest 07/04  Cardiogenic shock, resolved Anuric AKI - likely cardiorenal plus ATN Anasarca, improving Protein-calorie malnutrition Elevated LFTs due to shock liver, hepatic congestion - improving Diarrhea, resolved Anemia without evidence of acute blood loss Thrombocytopenia-improving  Elevated prothrombin time, resolved LE cellulitis - fully treated Severe deconditioning  Plan: Continue supplemental oxygen to maintain O2 sats >92% Per Nephrology recommendations CRRT stopped 07/18 plans for HD today  Synthroid increased to 100 mcg daily-07/18 Added midodrine and carvedilol-07/18 Amiodarone dose decreased 200 mg daily-07/18 Monitor BMET intermittently Monitor I/Os Correct electrolytes as indicated DVT px: SCDs Transfuse for 1 unit pRBC's  Monitor CBC intermittently Transfuse per usual guidelines Continue TF protocol Continue physical therapy  DNR/DNI  Per Vascular Surgery platelet count must be >70,000 prior to permcath placement    [name], [place] Pager [phone] (please enter 7 digits) PCCM Consult Pager [phone] (please enter 7 digits)

## 2017-04-25 NOTE — Progress Notes (Signed)
Patient ID: Jannifer Fischler, female   DOB: 01-09-1951, 66 y.o.   MRN: [id number]     [facility] PROGRESS NOTE  Shunda Rabadi [id number] DOB: 1951/05/02 DOA: 04/08/2017 PCP: Tracie Harrier, MD  HPI/Subjective: Patient is alert,awake,oriented. still on Levophed drip.started on midodrine. O2 [place] 2 L. Objective: Vitals:   04/25/17 1315 04/25/17 1330  BP: (!) 76/55 (!) 84/61  Pulse: (!) 102 100  Resp: 15 (!) 21  Temp: 98.4 F (36.9 C) 98.2 F (36.8 C)    Filed Weights   04/24/17 0500 04/25/17 0400 04/25/17 1000  Weight: 80.5 kg (177 lb 7.5 oz) 80.3 kg (177 lb 0.5 oz) 80.3 kg (177 lb 0.5 oz)    ROS: Review of Systems  Constitutional: Positive for malaise/fatigue. Negative for chills and fever.  HENT: Negative for hearing loss.   Eyes: Negative for blurred vision, double vision and photophobia.  Respiratory: Negative for cough, hemoptysis and shortness of breath.   Cardiovascular: Positive for leg swelling. Negative for chest pain, palpitations and orthopnea.  Gastrointestinal: Negative for abdominal pain, diarrhea and vomiting.  Genitourinary: Negative for dysuria and urgency.  Musculoskeletal: Negative for myalgias and neck pain.  Skin: Negative for rash.  Neurological: Positive for weakness. Negative for dizziness, focal weakness, seizures and headaches.  Psychiatric/Behavioral: Negative for memory loss. The patient does not have insomnia.     Exam: Physical Exam  HENT:  Head: Atraumatic.  Nose: No mucosal edema.  Mouth/Throat: No oropharyngeal exudate or posterior oropharyngeal edema.  Eyes: Pupils are equal, round, and reactive to light. Lids are normal.  Icteric sclera  Neck: Carotid bruit is not present. No thyromegaly present.  Cardiovascular: Regular rhythm, S1 normal, S2 normal and normal heart sounds.   Respiratory: She has decreased breath sounds in the right lower field and the left lower field. She has no wheezes. She has rhonchi in the left lower field.  She has no rales.  GI: Soft. Bowel sounds are normal. There is no tenderness.  Musculoskeletal:       Right ankle: She exhibits swelling.       Left ankle: She exhibits swelling.  Lymphadenopathy:    She has no cervical adenopathy.  Neurological:  Able to wiggle toes bilateral feet. Able to squeeze hands.  Skin: Skin is warm. No cyanosis.  Left lower extremity covered today Right foot with some blisters that are healing Bruising right neck and left thigh  Psychiatric: Her affect is blunt.      Data Reviewed: Basic Metabolic Panel:  Recent Labs Lab 04/23/17 2100 04/24/17 0456 04/24/17 1241 04/24/17 1452 04/24/17 2215 04/25/17 0441  NA 135 138 134*  --  135 138  K 4.6 4.9 4.7  --  4.2 4.4  CL 101 101 100*  --  100* 101  CO2 27 27 26   --  27 26  GLUCOSE 103* 121* 108*  --  112* 98  BUN 42* 46* 56*  --  51* 48*  CREATININE 0.86 0.98 1.02*  --  0.96 1.03*  CALCIUM 8.6* 8.9 8.4*  --  8.4* 8.7*  MG 2.3 2.3 10.0* 3.0* 2.0 1.9  PHOS 1.8* 1.9* 2.4*  --  2.7 3.3   Liver Function Tests: CBC:  Recent Labs Lab 04/19/17 0545 04/21/17 0500 04/22/17 0429 04/23/17 0437 04/25/17 0441  WBC 18.8* 13.0* 10.8 9.9 8.6  NEUTROABS 15.0*  --   --   --   --   HGB 7.5* 7.5* 7.6* 7.4* 6.8*  HCT 23.1* 23.1* 23.0* 22.6* 20.6*  MCV 96.4 97.4 97.9 97.4 98.4  PLT 53* 58* 59* 73* 66*   BNP (last 3 results)  Recent Labs  04/08/17 0139  BNP 2,012.0*     CBG:  Recent Labs Lab 04/24/17 0012 04/24/17 0728 04/24/17 1600 04/24/17 2338 04/25/17 0746  GLUCAP 120* 120* 116* 115* 99    Recent Results (from the past 240 hour(s))  CULTURE, BLOOD (ROUTINE X 2) w Reflex to ID Panel     Status: None   Collection Time: 04/16/17  1:21 PM  Result Value Ref Range Status   Specimen Description BLOOD BLOOD LEFT HAND  Final   Special Requests   Final    BOTTLES DRAWN AEROBIC AND ANAEROBIC Blood Culture results may not be optimal due to an inadequate volume of blood received in culture  bottles   Culture NO GROWTH 5 DAYS  Final   Report Status 04/21/2017 FINAL  Final  CULTURE, BLOOD (ROUTINE X 2) w Reflex to ID Panel     Status: None   Collection Time: 04/16/17  1:27 PM  Result Value Ref Range Status   Specimen Description BLOOD A-LINE DRAW  Final   Special Requests   Final    BOTTLES DRAWN AEROBIC AND ANAEROBIC Blood Culture adequate volume   Culture NO GROWTH 5 DAYS  Final   Report Status 04/21/2017 FINAL  Final  C difficile quick scan w PCR reflex     Status: None   Collection Time: 04/16/17 10:15 PM  Result Value Ref Range Status   C Diff antigen NEGATIVE NEGATIVE Final   C Diff toxin NEGATIVE NEGATIVE Final   C Diff interpretation No C. difficile detected.  Final    Comment: VALID     Studies: No results found.  Scheduled Meds: . amiodarone  200 mg Per Tube BID  . carvedilol  3.125 mg Per Tube BID WC  . famotidine  20 mg Per Tube QHS  . feeding supplement (ENSURE ENLIVE)  237 mL Oral TID BM  . [START ON 04/26/2017] levothyroxine  100 mcg Per Tube QAC breakfast  . mouth rinse  15 mL Mouth Rinse BID  . midodrine  5 mg Oral BID WC  . multivitamin  1 tablet Oral QHS  . potassium & sodium phosphates  2 packet Oral TID  . QUEtiapine  25 mg Per Tube QHS  . silver sulfADIAZINE   Topical BID   Continuous Infusions: . sodium chloride    . albumin human    . feeding supplement (VITAL AF 1.2 CAL) 1,000 mL (04/25/17 1300)  . norepinephrine (LEVOPHED) Adult infusion 5.013 mcg/min (04/25/17 1300)  . pureflow 2,000 mL/hr at 04/24/17 2300    Assessment/Plan:  1. Acute delirium.  Improved 2.  3. Septic shock with multiorgan failure. Overall prognosis is poor. Patient currently a DO NOT RESUSCITATE. Try to taper off Levophed drip. Started on midodrine. 4. PEA Cardiopulmonary arrest.  5. Shock liver from cardiopulmonary arrest. Liver function trending better. Total bilirubin decreased to 3.2. Has anemia, thrombocytopenia, platelet transfusions.today 6.  7. Acute  . Renal failure with proteinuria, hematuria: Baseline creatinine 0.62. Received CRRT ,Started on hemodialysis today. Unable to get  permacath due to  thrombocytopenia.Per Vascular Surgery platelet count must be >70,000 prior to permcath placement  8.  9. Acute respiratory failure status post extubation and on nasal cannula. continue O2 [place], NEB. 10. Hypomagnesemia, hypophosphatemia and hypokalemia. On electrolyte protocol 11. Atrial fibrillation with RVR,  continue amiodarone and Lopressor IV when necessary. Pradaxa on hold.  12.  13. Coagulopathy. Could be secondary to shock liver. 14. Diarrhea. resolved  15. Cardiomyopathy on echocardiogram. 16. Anemia and thrombocytopenia. Continue to monitor closely  17. Hypothyroidism unspecified on levothyroxine 18. Depression on Zoloft 19. Nutritional status. Patient with very poor oral intake and unable to keep up with her nutritional needs. Continue Dobbhoff tube feeding. Code Status:     Code Status Orders        Start     Ordered   04/08/17 0651  Full code  Continuous     04/08/17 0650    Code Status History    Date Active Date Inactive Code Status Order ID Comments User Context   This patient has a current code status but no historical code status.     Family Communication: Spoke with Husband at bedside Disposition Plan: To be determined based on clinical course  Consultants:  Critical care specialist  Infectious disease  Nephrology  Palliative care  Cardiology  Time spent: 25 minutes.   [employer]

## 2017-04-25 NOTE — Progress Notes (Signed)
Speech Therapy Note: reviewed chart notes; consulted NP re: pt's current status. Pt does appear to be getting min stronger per report, however, continues to have reduced desire for oral intake but does take sips of liquids(no overt s/s of aspiration noted by NSG). Pt continues w/ the Dobhoff for nutritional support.  NP stated Dobhoff would remain at this time but when appropriate, there would be a trial removal to assess if pt would be able to adequately meet her nutritional needs orally. ST services can be available at that time if needed.  Of note, the reason for initial ST eval was d/t decreased desire for eating/drinking; "not motivated".  Recommend continue thin liquids w/ general aspiration precautions; oral care. NP agreed.     Orinda Kenner, [place], CCC-SLP

## 2017-04-25 NOTE — Progress Notes (Signed)
Pre hd assessment  

## 2017-04-25 NOTE — Progress Notes (Signed)
  End of hd 

## 2017-04-25 NOTE — Progress Notes (Signed)
PT Cancellation Note  Patient Details Name: [name] MRN: [id number] DOB: [birth year]   Cancelled Treatment:    Reason Eval/Treat Not Completed: Medical issues which prohibited therapy (Pt's Hgb down to 6.8.  Pt remains on Levophed.)  Most recent BP 83/56.  Will complete PT order as pt remains medically inappropriate for exertional activity at this time.  Please place new PT order once pt medically appropriate.   Collie Siad PT, DPT 04/25/2017, 12:21 PM

## 2017-04-25 NOTE — Progress Notes (Signed)
[facility]  ROUNDING NOTE   Subjective:  Unclear how much urine output she is having. BUN currently 48 with a creatinine of 1.03. Patient was actually slightly net positive yesterday.  Objective:  Vital signs in last 24 hours:  Temp:  [94.7 F (34.8 C)-99.7 F (37.6 C)] 94.7 F (34.8 C) (07/18 0800) Pulse Rate:  [78-113] 88 (07/18 0900) Resp:  [9-31] 10 (07/18 0900) BP: (74-117)/(49-82) 86/65 (07/18 0900) SpO2:  [91 %-98 %] 97 % (07/18 0900) Weight:  [80.3 kg (177 lb 0.5 oz)] 80.3 kg (177 lb 0.5 oz) (07/18 0400)  Weight change: -0.2 kg (-7.1 oz) Filed Weights   04/23/17 0402 04/24/17 0500 04/25/17 0400  Weight: 81.3 kg (179 lb 3.7 oz) 80.5 kg (177 lb 7.5 oz) 80.3 kg (177 lb 0.5 oz)    Intake/Output: I/O last 3 completed shifts: In: 2072.7 [I.V.:255.6; NG/GT:1767.1; IV Piggyback:50] Out: 3922 [Other:3922]   Intake/Output this shift:  Total I/O In: 32.1 [I.V.:32.1] Out: 194 [Other:194]  Physical Exam: General: No acute distress   Head: Normocephalic, atraumatic  Eyes: + jaundice  Neck: supple  Lungs:  Mild crackles at bases, normal effort  Heart: Irregular  Abdomen:  Soft, nontender, obese  Extremities: 1+ bilateral lower extremity edema   Neurologic: Awake, alert, following commands   Skin: Warm, dry  Access: Right IJ temp cath 7/12 [name])    Basic Metabolic Panel:  Recent Labs Lab 04/23/17 2100 04/24/17 0456 04/24/17 1241 04/24/17 1452 04/24/17 2215 04/25/17 0441  NA 135 138 134*  --  135 138  K 4.6 4.9 4.7  --  4.2 4.4  CL 101 101 100*  --  100* 101  CO2 27 27 26   --  27 26  GLUCOSE 103* 121* 108*  --  112* 98  BUN 42* 46* 56*  --  51* 48*  CREATININE 0.86 0.98 1.02*  --  0.96 1.03*  CALCIUM 8.6* 8.9 8.4*  --  8.4* 8.7*  MG 2.3 2.3 10.0* 3.0* 2.0 1.9  PHOS 1.8* 1.9* 2.4*  --  2.7 3.3    Liver Function Tests:  Recent Labs Lab 04/19/17 0544  04/21/17 0500  04/23/17 0437  04/23/17 2100 04/24/17 0456 04/24/17 1241  04/24/17 2215 04/25/17 0441  AST 83*  --  67*  --  83*  --   --   --   --   --  66*  ALT 298*  --  190*  --  139*  --   --   --   --   --  93*  ALKPHOS  --   --  462*  --  421*  --   --   --   --   --  322*  BILITOT  --   --  4.0*  --  3.2*  --   --   --   --   --  2.9*  PROT  --   --  6.2*  --  6.5  --   --   --   --   --  6.3*  ALBUMIN 4.3  < > 3.5  < > 4.0  4.0  < > 4.1 4.0 3.8 3.8 4.1  4.1  < > = values in this interval not displayed. No results for input(s): LIPASE, AMYLASE in the last 168 hours. No results for input(s): AMMONIA in the last 168 hours.  CBC:  Recent Labs Lab 04/19/17 0545 04/21/17 0500 04/22/17 0429 04/23/17 0437 04/25/17 0441  WBC 18.8* 13.0*  10.8 9.9 8.6  NEUTROABS 15.0*  --   --   --   --   HGB 7.5* 7.5* 7.6* 7.4* 6.8*  HCT 23.1* 23.1* 23.0* 22.6* 20.6*  MCV 96.4 97.4 97.9 97.4 98.4  PLT 53* 58* 59* 73* 66*    Cardiac Enzymes: No results for input(s): CKTOTAL, CKMB, CKMBINDEX, TROPONINI in the last 168 hours.  BNP: Invalid input(s): POCBNP  CBG:  Recent Labs Lab 04/24/17 0012 04/24/17 0728 04/24/17 1600 04/24/17 2338 04/25/17 0746  GLUCAP 120* 120* 116* 115* 99    Microbiology: Results for orders placed or performed during the hospital encounter of 04/08/17  Culture, blood (routine x 2)     Status: None   Collection Time: 04/08/17  2:04 AM  Result Value Ref Range Status   Specimen Description BLOOD RIGHT ANTECUBITAL  Final   Special Requests   Final    BOTTLES DRAWN AEROBIC AND ANAEROBIC Blood Culture adequate volume   Culture NO GROWTH 5 DAYS  Final   Report Status 04/13/2017 FINAL  Final  Culture, blood (routine x 2)     Status: None   Collection Time: 04/08/17  2:06 AM  Result Value Ref Range Status   Specimen Description BLOOD LEFT ANTECUBITAL  Final   Special Requests   Final    BOTTLES DRAWN AEROBIC AND ANAEROBIC Blood Culture adequate volume   Culture NO GROWTH 5 DAYS  Final   Report Status 04/13/2017 FINAL  Final  C  difficile quick scan w PCR reflex     Status: None   Collection Time: 04/08/17  5:09 AM  Result Value Ref Range Status   C Diff antigen NEGATIVE NEGATIVE Final   C Diff toxin NEGATIVE NEGATIVE Final   C Diff interpretation No C. difficile detected.  Final  MRSA PCR Screening     Status: None   Collection Time: 04/08/17  6:50 AM  Result Value Ref Range Status   MRSA by PCR NEGATIVE NEGATIVE Final    Comment:        The GeneXpert MRSA Assay (FDA approved for NASAL specimens only), is one component of a comprehensive MRSA colonization surveillance program. It is not intended to diagnose MRSA infection nor to guide or monitor treatment for MRSA infections.   Urine culture     Status: None   Collection Time: 04/08/17 12:16 PM  Result Value Ref Range Status   Specimen Description URINE, RANDOM  Final   Special Requests NONE  Final   Culture   Final    NO GROWTH Performed at [facility], [address], [place], [place] [address]    Report Status 04/10/2017 FINAL  Final  Aerobic Culture (superficial specimen)     Status: None   Collection Time: 04/09/17 11:32 AM  Result Value Ref Range Status   Specimen Description SKIN  Final   Special Requests NONE  Final   Gram Stain   Final    RARE WBC PRESENT, PREDOMINANTLY PMN NO SQUAMOUS EPITHELIAL CELLS SEEN NO ORGANISMS SEEN    Culture   Final    NO GROWTH 2 DAYS Performed at [facility], [place] [address], [place],  [address]    Report Status 04/11/2017 FINAL  Final  Gastrointestinal Panel by PCR , Stool     Status: None   Collection Time: 04/09/17  5:10 PM  Result Value Ref Range Status   Campylobacter species NOT DETECTED NOT DETECTED Final   Plesimonas shigelloides NOT DETECTED NOT DETECTED Final  Salmonella species NOT DETECTED NOT DETECTED Final   Yersinia enterocolitica NOT DETECTED NOT DETECTED Final   Vibrio species NOT DETECTED NOT DETECTED Final   Vibrio cholerae NOT DETECTED NOT DETECTED Final    Enteroaggregative E coli (EAEC) NOT DETECTED NOT DETECTED Final   Enteropathogenic E coli (EPEC) NOT DETECTED NOT DETECTED Final   Enterotoxigenic E coli (ETEC) NOT DETECTED NOT DETECTED Final   Shiga like toxin producing E coli (STEC) NOT DETECTED NOT DETECTED Final   Shigella/Enteroinvasive E coli (EIEC) NOT DETECTED NOT DETECTED Final   Cryptosporidium NOT DETECTED NOT DETECTED Final   Cyclospora cayetanensis NOT DETECTED NOT DETECTED Final   Entamoeba histolytica NOT DETECTED NOT DETECTED Final   Giardia lamblia NOT DETECTED NOT DETECTED Final   Adenovirus F40/41 NOT DETECTED NOT DETECTED Final   Astrovirus NOT DETECTED NOT DETECTED Final   Norovirus GI/GII NOT DETECTED NOT DETECTED Final   Rotavirus A NOT DETECTED NOT DETECTED Final   Sapovirus (I, II, IV, and V) NOT DETECTED NOT DETECTED Final  Culture, respiratory (NON-Expectorated)     Status: None   Collection Time: 04/12/17 12:00 PM  Result Value Ref Range Status   Specimen Description TRACHEAL ASPIRATE  Final   Special Requests NONE  Final   Gram Stain   Final    MODERATE WBC PRESENT,BOTH PMN AND MONONUCLEAR MODERATE YEAST Performed at [facility], [address] [place], [place] [address]    Culture MODERATE CANDIDA TROPICALIS  Final   Report Status 04/14/2017 FINAL  Final  Culture, respiratory (NON-Expectorated)     Status: None   Collection Time: 04/15/17  7:48 AM  Result Value Ref Range Status   Specimen Description TRACHEAL ASPIRATE  Final   Special Requests Normal  Final   Gram Stain   Final    RARE WBC PRESENT, PREDOMINANTLY PMN FEW BUDDING YEAST SEEN Performed at [facility], [place] [address] [place],  [address]    Culture ABUNDANT CANDIDA TROPICALIS  Final   Report Status 04/18/2017 FINAL  Final  CULTURE, BLOOD (ROUTINE X 2) w Reflex to ID Panel     Status: None   Collection Time: 04/15/17 10:46 AM  Result Value Ref Range Status   Specimen Description BLOOD LEFT SUBCLAVIAN TRIPLE LUMEN   Final   Special Requests   Final    BOTTLES DRAWN AEROBIC AND ANAEROBIC Blood Culture results may not be optimal due to an excessive volume of blood received in culture bottles   Culture NO GROWTH 5 DAYS  Final   Report Status 04/20/2017 FINAL  Final  CULTURE, BLOOD (ROUTINE X 2) w Reflex to ID Panel     Status: None   Collection Time: 04/16/17  1:21 PM  Result Value Ref Range Status   Specimen Description BLOOD BLOOD LEFT HAND  Final   Special Requests   Final    BOTTLES DRAWN AEROBIC AND ANAEROBIC Blood Culture results may not be optimal due to an inadequate volume of blood received in culture bottles   Culture NO GROWTH 5 DAYS  Final   Report Status 04/21/2017 FINAL  Final  CULTURE, BLOOD (ROUTINE X 2) w Reflex to ID Panel     Status: None   Collection Time: 04/16/17  1:27 PM  Result Value Ref Range Status   Specimen Description BLOOD A-LINE DRAW  Final   Special Requests   Final    BOTTLES DRAWN AEROBIC AND ANAEROBIC Blood Culture adequate volume   Culture NO GROWTH 5 DAYS  Final   Report Status 04/21/2017 FINAL  Final  C difficile quick scan w PCR reflex     Status: None   Collection Time: 04/16/17 10:15 PM  Result Value Ref Range Status   C Diff antigen NEGATIVE NEGATIVE Final   C Diff toxin NEGATIVE NEGATIVE Final   C Diff interpretation No C. difficile detected.  Final    Comment: VALID    Coagulation Studies: No results for input(s): LABPROT, INR in the last 72 hours.  Urinalysis: No results for input(s): COLORURINE, LABSPEC, PHURINE, GLUCOSEU, HGBUR, BILIRUBINUR, KETONESUR, PROTEINUR, UROBILINOGEN, NITRITE, LEUKOCYTESUR in the last 72 hours.  Invalid input(s): APPERANCEUR    Imaging: No results found.   Medications:   . albumin human    . feeding supplement (VITAL AF 1.2 CAL) Stopped (04/25/17 0000)  . norepinephrine (LEVOPHED) Adult infusion 8 mcg/min (04/25/17 0900)  . pureflow 2,000 mL/hr at 04/24/17 2300   . amiodarone  400 mg Per Tube BID  .  carvedilol  3.125 mg Per Tube BID WC  . famotidine  20 mg Per Tube QHS  . feeding supplement (ENSURE ENLIVE)  237 mL Oral TID BM  . levothyroxine  75 mcg Per Tube QAC breakfast  . mouth rinse  15 mL Mouth Rinse BID  . multivitamin  1 tablet Oral QHS  . potassium & sodium phosphates  2 packet Oral TID  . QUEtiapine  25 mg Oral QHS  . silver sulfADIAZINE   Topical BID   artificial tears, heparin, ipratropium-albuterol, LORazepam, metoprolol tartrate, morphine injection, [DISCONTINUED] ondansetron **OR** ondansetron (ZOFRAN) IV, phenol, sodium chloride flush, zinc oxide  Assessment/ Plan:  [name] is a 66 y.o. white female  with hypertension, hypothyroidism, atrial fibrillation, who was admitted to [facility] on 04/08/2017 for evaluation of increasing lower extremity edema.  Hospital course: Admitted on 04/08/2017 with significant peripheral edema and cellulitis. Started on CRRT for hypotension and anuric renal failure on 7/2. Patient weaned off CRRT on 7/4. However later that day, patient had cardiac arrest and coded. Intubated, sedated and restarted on three vasopressors. Restart on CRRT on 7/4.  2-D echo which shows EF 25-30%  1. Acute renal failure with proteinuria and hematuria: baseline creatinine of 0.62 09/2014.  Patient with massive peripheral edema, anasarca and hypoalbuminemia.  Recent IV contrast exposure on 04/04/17.  - Serologic work up: compliments normal, negative SPEP/UPEP, ANA negative, ANCA negative, GBM negative, negative hepatitis -  Unclear how much urine output she is actually having at the moment. BUN currently 48 with a creatinine of 1.0 however this is been under the influence of CRRT. We will transition the patient to hemodialysis today. We have requested that vascular surgery place a PermCath.  2. 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 Echocardiogram with EF  of 25-30% - Hopefully patient can be weaned off of norepinephrine today.  3. Acute respiratory failure Extubated 7/9 - Patient breathing comfortably at the moment. Remains on nasal cannula.  4. 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 - Management of blood sugars as per hospitalist and pulmonary/critical care  5. Generalized edema. Her edema has improved with continuous renal placement therapy. We will plan for dialysis today with target ultrafiltration of 1.5 kg.   LOS: [place] 7/18/20189:58 AM

## 2017-04-25 NOTE — Progress Notes (Addendum)
CRRT discontinued at 0930, rinseback completed, pt is currently receiving HD in room, PermCath on hold until platelets are >70 per Dr Delana Meyer. Tube feeds and clear liquids resumed.  Levophed titrated up and down this morning with elevations and decreases in MAP, esp during HD, now seems relatively stable with levophed at 5 mcgs.  Pt seems unable to maintain a relatively constant body temp.  Temp would not register this AM orally or axillary.  Bair hugger applied and rectal probe inserted.  Temp approx 35.8.  Increased rapidly to normal with [facility].  Bair hugger reduced to "Ambient" at this time

## 2017-04-26 ENCOUNTER — Encounter
Admission: EM | Disposition: A | Discharge status: Rehabilitation Facility | Payer: Self-pay | Source: Home / Self Care | Attending: Internal Medicine

## 2017-04-26 DIAGNOSIS — R601 Generalized edema: Secondary | ICD-10-CM

## 2017-04-26 DIAGNOSIS — N186 End stage renal disease: Secondary | ICD-10-CM

## 2017-04-26 DIAGNOSIS — R509 Fever, unspecified: Secondary | ICD-10-CM

## 2017-04-26 HISTORY — PX: DIALYSIS/PERMA CATHETER INSERTION: CATH118288

## 2017-04-26 LAB — BASIC METABOLIC PANEL
Anion gap: 11 (ref 5–15)
BUN: 50 mg/dL — AB (ref 6–20)
CALCIUM: 8.2 mg/dL — AB (ref 8.9–10.3)
CO2: 27 mmol/L (ref 22–32)
CREATININE: 1.05 mg/dL — AB (ref 0.44–1.00)
Chloride: 100 mmol/L — ABNORMAL LOW (ref 101–111)
GFR calc Af Amer: >60 mL/min (ref >60)
GFR, EST NON AFRICAN AMERICAN: 55 mL/min — AB (ref >60)
GLUCOSE: 129 mg/dL — AB (ref 65–99)
Potassium: 4.3 mmol/L (ref 3.5–5.1)
Sodium: 138 mmol/L (ref 135–145)

## 2017-04-26 LAB — CBC WITH DIFFERENTIAL/PLATELET
BAND NEUTROPHILS: 2 %
BASOS PCT: 0 %
Basophils Absolute: 0 10*3/uL (ref 0–0.1)
Blasts: 0 %
EOS ABS: 0 10*3/uL (ref 0–0.7)
EOS PCT: 0 %
HCT: 25.6 % — ABNORMAL LOW (ref 35.0–47.0)
Hemoglobin: 8.6 g/dL — ABNORMAL LOW (ref 12.0–16.0)
LYMPHS ABS: 1.5 10*3/uL (ref 1.0–3.6)
Lymphocytes Relative: 15 %
MCH: 32.2 pg (ref 26.0–34.0)
MCHC: 33.6 g/dL (ref 32.0–36.0)
MCV: 95.9 fL (ref 80.0–100.0)
METAMYELOCYTES PCT: 1 %
MONO ABS: 0.7 10*3/uL (ref 0.2–0.9)
MYELOCYTES: 0 %
Monocytes Relative: 7 %
NEUTROS PCT: 75 %
NRBC: 1 /100{WBCs} — AB
Neutro Abs: 7.8 10*3/uL — ABNORMAL HIGH (ref 1.4–6.5)
Other: 0 %
PLATELETS: 103 10*3/uL — AB (ref 150–440)
PROMYELOCYTES ABS: 0 %
RBC: 2.67 MIL/uL — ABNORMAL LOW (ref 3.80–5.20)
RDW: 20.5 % — ABNORMAL HIGH (ref 11.5–14.5)
WBC: 10 10*3/uL (ref 3.6–11.0)

## 2017-04-26 LAB — BPAM RBC
Blood Product Expiration Date: 201808082359
ISSUE DATE / TIME: 201807181541
Unit Type and Rh: 5100

## 2017-04-26 LAB — TYPE AND SCREEN
ABO/RH(D): O POS
Antibody Screen: NEGATIVE
Unit division: 0

## 2017-04-26 LAB — PHOSPHORUS: Phosphorus: 3.8 mg/dL (ref 2.5–4.6)

## 2017-04-26 LAB — GLUCOSE, CAPILLARY
Glucose-Capillary: 100 mg/dL — ABNORMAL HIGH (ref 65–99)
Glucose-Capillary: 131 mg/dL — ABNORMAL HIGH (ref 65–99)
Glucose-Capillary: 137 mg/dL — ABNORMAL HIGH (ref 65–99)

## 2017-04-26 LAB — APTT: aPTT: 33 seconds (ref 24–36)

## 2017-04-26 SURGERY — DIALYSIS/PERMA CATHETER INSERTION
Anesthesia: Moderate Sedation

## 2017-04-26 MED ORDER — CEFAZOLIN SODIUM-DEXTROSE 1-4 GM/50ML-% IV SOLN
1.0000 g | Freq: Once | INTRAVENOUS | Status: AC
  Administered 2017-04-26: 1 g via INTRAVENOUS
  Filled 2017-04-26: qty 50

## 2017-04-26 MED ORDER — BOOST / RESOURCE BREEZE PO LIQD
1.0000 | Freq: Three times a day (TID) | ORAL | Status: DC
Start: 2017-04-26 — End: 2017-04-30
  Administered 2017-04-26 – 2017-04-30 (×8): 1 via ORAL

## 2017-04-26 MED ORDER — MIDODRINE HCL 5 MG PO TABS
10.0000 mg | ORAL_TABLET | Freq: Three times a day (TID) | ORAL | Status: DC
  Administered 2017-04-26 – 2017-05-04 (×24): 10 mg via ORAL
  Filled 2017-04-26: qty 4
  Filled 2017-04-26: qty 2
  Filled 2017-04-26: qty 4
  Filled 2017-04-26: qty 2
  Filled 2017-04-26: qty 4
  Filled 2017-04-26: qty 2
  Filled 2017-04-26: qty 4
  Filled 2017-04-26 (×5): qty 2
  Filled 2017-04-26: qty 4
  Filled 2017-04-26 (×4): qty 2
  Filled 2017-04-26: qty 4
  Filled 2017-04-26: qty 2
  Filled 2017-04-26 (×6): qty 4
  Filled 2017-04-26 (×4): qty 2

## 2017-04-26 MED ORDER — CEFUROXIME SODIUM 1.5 G IV SOLR: 1.5000 g | INTRAVENOUS | Status: DC

## 2017-04-26 MED ORDER — COLLAGENASE 250 UNIT/GM EX OINT
TOPICAL_OINTMENT | Freq: Every day | CUTANEOUS | Status: DC
  Administered 2017-04-26 – 2017-05-06 (×11): via TOPICAL
  Filled 2017-04-26 (×2): qty 30

## 2017-04-26 MED ORDER — SODIUM CHLORIDE 0.9 % IV SOLN: INTRAVENOUS | Status: DC

## 2017-04-26 MED ORDER — ACETAMINOPHEN 325 MG PO TABS
650.0000 mg | ORAL_TABLET | ORAL | Status: DC | PRN
  Administered 2017-04-26: 650 mg via ORAL
  Filled 2017-04-26: qty 2

## 2017-04-26 MED ORDER — SODIUM CHLORIDE 0.9 % IV SOLN
INTRAVENOUS | Status: DC
  Administered 2017-04-26: 16:00:00 via INTRAVENOUS

## 2017-04-26 MED ORDER — MIDAZOLAM HCL 2 MG/2ML IJ SOLN
INTRAMUSCULAR | Status: DC | PRN
  Administered 2017-04-26: 1 mg via INTRAVENOUS

## 2017-04-26 MED ORDER — HEPARIN SODIUM (PORCINE) 10000 UNIT/ML IJ SOLN
INTRAMUSCULAR | Status: AC
  Filled 2017-04-26: qty 1

## 2017-04-26 MED ORDER — FENTANYL CITRATE (PF) 100 MCG/2ML IJ SOLN
INTRAMUSCULAR | Status: AC
  Filled 2017-04-26: qty 2

## 2017-04-26 MED ORDER — FENTANYL CITRATE (PF) 100 MCG/2ML IJ SOLN
INTRAMUSCULAR | Status: DC | PRN
  Administered 2017-04-26: 25 ug via INTRAVENOUS

## 2017-04-26 MED ORDER — MIDAZOLAM HCL 5 MG/5ML IJ SOLN
INTRAMUSCULAR | Status: AC
  Filled 2017-04-26: qty 5

## 2017-04-26 MED ORDER — LIDOCAINE-EPINEPHRINE (PF) 2 %-1:200000 IJ SOLN
INTRAMUSCULAR | Status: AC
  Filled 2017-04-26: qty 20

## 2017-04-26 SURGICAL SUPPLY — 3 items
CANNULA 5F STIFF (CANNULA) ×3 IMPLANT
CATH PALINDROME RT-P 15FX19CM (CATHETERS) ×3 IMPLANT
PACK ANGIOGRAPHY (CUSTOM PROCEDURE TRAY) ×3 IMPLANT

## 2017-04-26 NOTE — Consult Note (Signed)
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 Left foot: 2nd digit with intact blister (serum filled) measuring 2cm x 1.5cm. Anterior foot with 6 cm x 3 cm 100% scabbed thin wound bed. There are several small intact serum filled blisters on the lateral and medial foot, the largest of which measures 0.4cm round x 0.1cm. Left LE (anterior aspect): 9cm x 5cm area of purple discoloration with loose hanging serum filled blister at distal and lateral edge of wound measuring 4cm x 2cm.  This area has debrided nicely with Silvadene.  WIll continue that to this wound and begin enzymatic debridement to left dorsal foot.  Wound bed:As described above Drainage (amount, consistency, odor) Serous Periwound: intact. Bilateral LE edema is resolving with elevation, but is still present. Dressing procedure/placement/frequency: Will continue  twice daily wound care using silver sulfadiazine cream (Silvadene) topped with moist dressings and covered with dry padded dressings to left lower leg.  Cleanse left dorsal foot with NS and pat dry.  Apply Santyl ointment to scabbed wound bed.  Cover with NS moist gauze.  Secure with ABD pad and kerlix and tape.  Change daily.  Prevalon pressure redistribution heel boots.  Will not follow at this time.  Please re-consult if needed.  Domenic Moras RN BSN [employer] Pager [phone]

## 2017-04-26 NOTE — Progress Notes (Signed)
Wound care completed to [facility] as ordered.

## 2017-04-26 NOTE — Op Note (Signed)
OPERATIVE NOTE    PRE-OPERATIVE DIAGNOSIS: 1. ESRD   POST-OPERATIVE DIAGNOSIS: same as above  PROCEDURE: 1. Ultrasound guidance for vascular access to the right internal jugular vein 2. Fluoroscopic guidance for placement of catheter 3. Placement of a 19 cm tip to cuff tunneled hemodialysis catheter via the right internal jugular vein  SURGEON: Leotis Pain, MD  ANESTHESIA:  Local with Moderate conscious sedation for approximately 15 minutes using 1 mg of Versed and 25 mcg of Fentanyl  ESTIMATED BLOOD LOSS: 15 cc  FLUORO TIME: less than one minute  CONTRAST: none  FINDING(S): 1.  Patent right internal jugular vein  SPECIMEN(S):  None  INDICATIONS:   [name] is a 66 y.o.female who presents with renal failure and multiple other issues.  The patient needs long term dialysis access for their ESRD, and a Permcath is necessary.  Risks and benefits are discussed and informed consent is obtained.    DESCRIPTION: After obtaining full informed written consent, the patient was brought back to the vascular suited. The patient's right neck and chest were sterilely prepped and draped in a sterile surgical field was created. Moderate conscious sedation was administered during a face to face encounter with the patient throughout the procedure with my supervision of the RN administering medicines and monitoring the patient's vital signs, pulse oximetry, telemetry and mental status throughout from the start of the procedure until the patient was taken to the recovery room.  The right internal jugular vein was visualized with ultrasound and found to be patent. It was then accessed under direct ultrasound guidance and a permanent image was recorded. A wire was placed. After skin nick and dilatation, the peel-away sheath was placed over the wire. I then turned my attention to an area under the clavicle. Approximately 1-2 fingerbreadths below the clavicle a small counterincision was created and  tunneled from the subclavicular incision to the access site. Using fluoroscopic guidance, a 19 centimeter tip to cuff tunneled hemodialysis catheter was selected, and tunneled from the subclavicular incision to the access site. It was then placed through the peel-away sheath and the peel-away sheath was removed. Using fluoroscopic guidance the catheter tips were parked in the right atrium. The appropriate distal connectors were placed. It withdrew blood well and flushed easily with heparinized saline and a concentrated heparin solution was then placed. It was secured to the chest wall with 2 Prolene sutures. The access incision was closed single 4-0 Monocryl. A 4-0 Monocryl pursestring suture was placed around the exit site. Sterile dressings were placed. The patient tolerated the procedure well and was taken to the recovery room in stable condition.  COMPLICATIONS: None  CONDITION: Stable  Leotis Pain, MD 04/26/2017 4:29 PM   This note was created with Dragon Medical transcription system. Any errors in dictation are purely unintentional.

## 2017-04-26 NOTE — Care Management (Signed)
Met with patient and husband regarding LTAC selection. They picked [facility]. Pre-cert started.

## 2017-04-26 NOTE — Progress Notes (Signed)
dobhoff removed from rt nare per NP Hinton Dyer, will strongly encourage oral intake, pt's husband also strongly encouraging her to eat.  Also order physical therapy

## 2017-04-26 NOTE — Progress Notes (Signed)
PT Cancellation Note  Patient Details Name: Juliana Boling MRN: [id number] DOB: 07-Jul-1951   Cancelled Treatment:    Reason Eval/Treat Not Completed: Patient at procedure or test/unavailable (Consult received and chart reviewed.  Patient currently off unit for perm-cath placement.  Will re-attempt at later time/date as patient medically appropriate and available.)   Nelsie Domino H. Owens Shark, PT, DPT, NCS 04/26/17, 3:28 PM [phone]

## 2017-04-26 NOTE — Progress Notes (Signed)
[place] for Electrolyte Replacement    Pharmacy consulted for electrolyte management for 66 yo female previously requiring CRRT. Patient off CRRT and being converted to intermittent hemodialysis. Dialysis is scheduled for 7/20.   Plan:  No replacement warranted. Will recheck electrolytes with am labs.   No Known Allergies  Patient Measurements: Height: 5\' 5"  (165.1 cm) Weight: 174 lb (78.9 kg) IBW/kg (Calculated) : 57 Vital Signs: Temp: 99 F (37.2 C) (07/19 1800) Temp Source: Rectal (07/19 1800) BP: 101/75 (07/19 1823) Pulse Rate: 108 (07/19 1823) Intake/Output from previous day: 07/18 0701 - 07/19 0700 In: 2038.4 [I.V.:125.5; Blood:350; NG/GT:1162.9; IV Piggyback:50] Out: 994  Intake/Output from this shift: No intake/output data recorded.  Labs:  Recent Labs  04/24/17 0456  04/24/17 2215 04/25/17 0441 04/25/17 1730 04/25/17 2335 04/26/17 0522 04/26/17 0742  WBC  --   --   --  8.6  --   --   --  10.0  HGB  --   --   --  6.8*  --   --   --  8.6*  HCT  --   --   --  20.6*  --   --   --  25.6*  PLT  --   --   --  66*  --   --   --  103*  APTT 36  --   --  26  --   --  33  --   CREATININE 0.98  < > 0.96 1.03* 0.55  --  1.05*  --   MG 2.3  < > 2.0 1.9 1.8  --   --   --   PHOS 1.9*  < > 2.7 3.3 2.8 3.8  --   --   ALBUMIN 4.0  < > 3.8 4.1  4.1 4.3  --   --   --   PROT  --   --   --  6.3*  --   --   --   --   AST  --   --   --  66*  --   --   --   --   ALT  --   --   --  93*  --   --   --   --   ALKPHOS  --   --   --  322*  --   --   --   --   BILITOT  --   --   --  2.9*  --   --   --   --   BILIDIR  --   --   --  1.3*  --   --   --   --   IBILI  --   --   --  1.6*  --   --   --   --   < > = values in this interval not displayed.  Lab Results  Component Value Date   K 4.3 04/26/2017    Estimated Creatinine Clearance: 55.5 mL/min (A) (by C-G formula based on SCr of 1.05 mg/dL (H)).   Potassium (mmol/L)  Date Value  04/26/2017  4.3  09/19/2014 4.1   Calcium (mg/dL)  Date Value  04/26/2017 8.2 (L)   Calcium, Total (mg/dL)  Date Value  09/19/2014 8.3 (L)   Sodium (mmol/L)  Date Value  04/26/2017 138  09/19/2014 140    Pharmacy will continue to monitor and adjust per consult.   MLS 04/26/17 7:22 PM

## 2017-04-26 NOTE — Progress Notes (Signed)
Patient ID: [name], female   DOB: [birth year], 66 y.o.   MRN: [id number]     [facility] PROGRESS NOTE  Rodney Wigger YPP:[id number] DOB: 1951/02/22 DOA: 04/08/2017 PCP: Tracie Harrier, MD  HPI/Subjective: Patient is alert,awake, low-grade temperature today. BP low.. On Levophed also. Objective: Vitals:   04/26/17 1100 04/26/17 1200  BP: 108/65 93/68  Pulse: (!) 104 (!) 104  Resp: 19 (!) 22  Temp: 100.2 F (37.9 C) 100.2 F (37.9 C)    Filed Weights   04/25/17 0400 04/25/17 1000 04/26/17 0900  Weight: 80.3 kg (177 lb 0.5 oz) 80.3 kg (177 lb 0.5 oz) 79.3 kg (174 lb 13.2 oz)    ROS: Review of Systems  Constitutional: Positive for malaise/fatigue. Negative for chills and fever.  HENT: Negative for hearing loss.   Eyes: Negative for blurred vision, double vision and photophobia.  Respiratory: Negative for cough, hemoptysis and shortness of breath.   Cardiovascular: Positive for leg swelling. Negative for chest pain, palpitations and orthopnea.  Gastrointestinal: Negative for abdominal pain, diarrhea and vomiting.  Genitourinary: Negative for dysuria and urgency.  Musculoskeletal: Negative for myalgias and neck pain.  Skin: Negative for rash.  Neurological: Positive for weakness. Negative for dizziness, focal weakness, seizures and headaches.  Psychiatric/Behavioral: Negative for memory loss. The patient does not have insomnia.     Exam: Physical Exam  HENT:  Head: Atraumatic.  Nose: No mucosal edema.  Mouth/Throat: No oropharyngeal exudate or posterior oropharyngeal edema.  Eyes: Pupils are equal, round, and reactive to light. Lids are normal.  Icteric sclera  Neck: Carotid bruit is not present. No thyromegaly present.  Cardiovascular: Regular rhythm, S1 normal, S2 normal and normal heart sounds.   Respiratory: She has no decreased breath sounds. She has no wheezes. She has no rales.  GI: Soft. Bowel sounds are normal. There is no tenderness.  Musculoskeletal:        Right ankle: She exhibits swelling.       Left ankle: She exhibits swelling.  Lymphadenopathy:    She has no cervical adenopathy.  Neurological:  Able to wiggle toes bilateral feet. Able to squeeze hands.  Skin: Skin is warm. No cyanosis.  Left lower extremity covered today Right foot with some blisters that are healing Bruising right neck and left thigh  Psychiatric: Her affect is blunt.  Skin infection with cellulitis of left ankle.    Data Reviewed: Basic Metabolic Panel:  Recent Labs Lab 04/24/17 1241 04/24/17 1452 04/24/17 2215 04/25/17 0441 04/25/17 1730 04/25/17 2335 04/26/17 0522  NA 134*  --  135 138 140  --  138  K 4.7  --  4.2 4.4 4.0  --  4.3  CL 100*  --  100* 101 100*  --  100*  CO2 26  --  27 26 29   --  27  GLUCOSE 108*  --  112* 98 110*  --  129*  BUN 56*  --  51* 48* 24*  --  50*  CREATININE 1.02*  --  0.96 1.03* 0.55  --  1.05*  CALCIUM 8.4*  --  8.4* 8.7* 8.6*  --  8.2*  MG 10.0* 3.0* 2.0 1.9 1.8  --   --   PHOS 2.4*  --  2.7 3.3 2.8 3.8  --    Liver Function Tests: CBC:  Recent Labs Lab 04/21/17 0500 04/22/17 0429 04/23/17 0437 04/25/17 0441 04/26/17 0742  WBC 13.0* 10.8 9.9 8.6 10.0  NEUTROABS  --   --   --   --  7.8*  HGB 7.5* 7.6* 7.4* 6.8* 8.6*  HCT 23.1* 23.0* 22.6* 20.6* 25.6*  MCV 97.4 97.9 97.4 98.4 95.9  PLT 58* 59* 73* 66* 103*   BNP (last 3 results)  Recent Labs  04/08/17 0139  BNP 2,012.0*     CBG:  Recent Labs Lab 04/24/17 2338 04/25/17 0746 04/25/17 1612 04/25/17 2356 04/26/17 0755  GLUCAP 115* 99 106* 137* 131*    Recent Results (from the past 240 hour(s))  C difficile quick scan w PCR reflex     Status: None   Collection Time: 04/16/17 10:15 PM  Result Value Ref Range Status   C Diff antigen NEGATIVE NEGATIVE Final   C Diff toxin NEGATIVE NEGATIVE Final   C Diff interpretation No C. difficile detected.  Final    Comment: VALID     Studies: No results found.  Scheduled Meds: .  amiodarone  200 mg Per Tube BID  . carvedilol  3.125 mg Per Tube BID WC  . collagenase   Topical Daily  . famotidine  20 mg Per Tube QHS  . feeding supplement  1 Container Oral TID BM  . levothyroxine  100 mcg Per Tube QAC breakfast  . mouth rinse  15 mL Mouth Rinse BID  . midodrine  10 mg Oral TID WC  . multivitamin  1 tablet Oral QHS  . potassium & sodium phosphates  2 packet Oral TID  . QUEtiapine  25 mg Per Tube QHS  . silver sulfADIAZINE   Topical BID   Continuous Infusions: . norepinephrine (LEVOPHED) Adult infusion 8.96 mcg/min (04/26/17 1200)  . pureflow 2,000 mL/hr at 04/24/17 2300    Assessment/Plan:  1. Acute delirium.  Improved 2.  3. Septic shock with multiorgan failure. Overall prognosis is poor. Patient currently a DO NOT RESUSCITATE. Try to taper off Levophed drip. Started on midodrine.Low-grade temperature last night,remove temporarily dialysis catheter, get permacath. Today.   4. PEA Cardiopulmonary arrest.  5.  Shock liver from cardiopulmonary arrest. Liver function trending better. T 6. Acute . Renal failure with proteinuria, hematuria: Baseline creatinine 0.62. Received CRRT ,Started on hemodialysis today.  7.  8. Acute respiratory failure status post extubation and on nasal cannula. continue O2 [place], NEB. 9.  10. Hypomagnesemia, hypophosphatemia and hypokalemia. On electrolyte protocol 11. Atrial fibrillation with RVR,  continue amiodarone and Lopressor IV when necessary. Pradaxa on hold.  12.  13. Coagulopathy. Could be secondary to shock liver. 14. Diarrhea. resolved   15. Cardiomyopathy on echocardiogram. 16. Anemia and thrombocytopenia. Continue to monitor closely  17. Hypothyroidism unspecified on levothyroxine 18. Depression on Zoloft 19. Nutritional status. Removed.ng tube., started feeding, 20. LATCH referal in progress. Code Status:     Code Status Orders        Start     Ordered   04/08/17 0651  Full code  Continuous     04/08/17 0650     Code Status History    Date Active Date Inactive Code Status Order ID Comments User Context   This patient has a current code status but no historical code status.     Family Communication: Spoke with Husband at bedside Disposition Plan: To be determined based on clinical course  Consultants:  Critical care specialist  Infectious disease  Nephrology  Palliative care  Cardiology  Time spent: 25 minutes.   [facility]

## 2017-04-26 NOTE — Progress Notes (Signed)
[facility]  ROUNDING NOTE   Subjective:  Patient seen at bedside. Appears to be in good spirits today. Ultrafiltration achieved yesterday was 1 kg. PermCath was not placed secondary to thrombocytopenia. However platelets are up to 103,000.  Objective:  Vital signs in last 24 hours:  Temp:  [92.8 F (33.8 C)-101.5 F (38.6 C)] 100.2 F (37.9 C) (07/19 0900) Pulse Rate:  [87-118] 91 (07/19 0900) Resp:  [13-36] 21 (07/19 0900) BP: (67-135)/(47-111) 94/72 (07/19 0900) SpO2:  [89 %-95 %] 90 % (07/19 0900) Weight:  [79.3 kg (174 lb 13.2 oz)] 79.3 kg (174 lb 13.2 oz) (07/19 0900)  Weight change: 0 kg (0 lb) Filed Weights   04/25/17 0400 04/25/17 1000 04/26/17 0900  Weight: 80.3 kg (177 lb 0.5 oz) 80.3 kg (177 lb 0.5 oz) 79.3 kg (174 lb 13.2 oz)    Intake/Output: I/O last 3 completed shifts: In: 2138.1 [I.V.:225.2; Blood:350; Other:350; NG/GT:1162.9; IV Piggyback:50] Out: 2148 [Other:2148]   Intake/Output this shift:  Total I/O In: 945.7 [I.V.:100.7; NG/GT:845] Out: 0   Physical Exam: General: No acute distress   Head: Normocephalic, atraumatic  Eyes: + jaundice  Neck: supple  Lungs:  Mild crackles at bases, normal effort  Heart: Irregular  Abdomen:  Soft, nontender, obese  Extremities: 1+ bilateral lower extremity edema   Neurologic: Awake, alert, following commands   Skin: Warm, dry  Access: Right IJ temp cath 7/12 H[facility] Dyer Blakeney)    Basic Metabolic Panel:  Recent Labs Lab 04/24/17 1241 04/24/17 1452 04/24/17 2215 04/25/17 0441 04/25/17 1730 04/25/17 2335 04/26/17 0522  NA 134*  --  135 138 140  --  138  K 4.7  --  4.2 4.4 4.0  --  4.3  CL 100*  --  100* 101 100*  --  100*  CO2 26  --  27 26 29   --  27  GLUCOSE 108*  --  112* 98 110*  --  129*  BUN 56*  --  51* 48* 24*  --  50*  CREATININE 1.02*  --  0.96 1.03* 0.55  --  1.05*  CALCIUM 8.4*  --  8.4* 8.7* 8.6*  --  8.2*  MG 10.0* 3.0* 2.0 1.9 1.8  --   --   PHOS 2.4*  --  2.7 3.3 2.8  3.8  --     Liver Function Tests:  Recent Labs Lab 04/21/17 0500  04/23/17 0437  04/24/17 0456 04/24/17 1241 04/24/17 2215 04/25/17 0441 04/25/17 1730  AST 67*  --  83*  --   --   --   --  66*  --   ALT 190*  --  139*  --   --   --   --  93*  --   ALKPHOS 462*  --  421*  --   --   --   --  322*  --   BILITOT 4.0*  --  3.2*  --   --   --   --  2.9*  --   PROT 6.2*  --  6.5  --   --   --   --  6.3*  --   ALBUMIN 3.5  < > 4.0  4.0  < > 4.0 3.8 3.8 4.1  4.1 4.3  < > = values in this interval not displayed. No results for input(s): LIPASE, AMYLASE in the last 168 hours. No results for input(s): AMMONIA in the last 168 hours.  CBC:  Recent Labs Lab 04/21/17 0500 04/22/17  8295 04/23/17 0437 04/25/17 0441 04/26/17 0742  WBC 13.0* 10.8 9.9 8.6 10.0  NEUTROABS  --   --   --   --  7.8*  HGB 7.5* 7.6* 7.4* 6.8* 8.6*  HCT 23.1* 23.0* 22.6* 20.6* 25.6*  MCV 97.4 97.9 97.4 98.4 95.9  PLT 58* 59* 73* 66* 103*    Cardiac Enzymes: No results for input(s): CKTOTAL, CKMB, CKMBINDEX, TROPONINI in the last 168 hours.  BNP: Invalid input(s): POCBNP  CBG:  Recent Labs Lab 04/24/17 2338 04/25/17 0746 04/25/17 1612 04/25/17 2356 04/26/17 0755  GLUCAP 115* 99 106* 137* 131*    Microbiology: Results for orders placed or performed during the hospital encounter of 04/08/17  Culture, blood (routine x 2)     Status: None   Collection Time: 04/08/17  2:04 AM  Result Value Ref Range Status   Specimen Description BLOOD RIGHT ANTECUBITAL  Final   Special Requests   Final    BOTTLES DRAWN AEROBIC AND ANAEROBIC Blood Culture adequate volume   Culture NO GROWTH 5 DAYS  Final   Report Status 04/13/2017 FINAL  Final  Culture, blood (routine x 2)     Status: None   Collection Time: 04/08/17  2:06 AM  Result Value Ref Range Status   Specimen Description BLOOD LEFT ANTECUBITAL  Final   Special Requests   Final    BOTTLES DRAWN AEROBIC AND ANAEROBIC Blood Culture adequate volume    Culture NO GROWTH 5 DAYS  Final   Report Status 04/13/2017 FINAL  Final  C difficile quick scan w PCR reflex     Status: None   Collection Time: 04/08/17  5:09 AM  Result Value Ref Range Status   C Diff antigen NEGATIVE NEGATIVE Final   C Diff toxin NEGATIVE NEGATIVE Final   C Diff interpretation No C. difficile detected.  Final  MRSA PCR Screening     Status: None   Collection Time: 04/08/17  6:50 AM  Result Value Ref Range Status   MRSA by PCR NEGATIVE NEGATIVE Final    Comment:        The GeneXpert MRSA Assay (FDA approved for NASAL specimens only), is one component of a comprehensive MRSA colonization surveillance program. It is not intended to diagnose MRSA infection nor to guide or monitor treatment for MRSA infections.   Urine culture     Status: None   Collection Time: 04/08/17 12:16 PM  Result Value Ref Range Status   Specimen Description URINE, RANDOM  Final   Special Requests NONE  Final   Culture   Final    NO GROWTH Performed at [facility], [address], [place], [place] [address]    Report Status 04/10/2017 FINAL  Final  Aerobic Culture (superficial specimen)     Status: None   Collection Time: 04/09/17 11:32 AM  Result Value Ref Range Status   Specimen Description SKIN  Final   Special Requests NONE  Final   Gram Stain   Final    RARE WBC PRESENT, PREDOMINANTLY PMN NO SQUAMOUS EPITHELIAL CELLS SEEN NO ORGANISMS SEEN    Culture   Final    NO GROWTH 2 DAYS Performed at [facility], [place] [address] [place], [place] [address]    Report Status 04/11/2017 FINAL  Final  Gastrointestinal Panel by PCR , Stool     Status: None   Collection Time: 04/09/17  5:10 PM  Result Value Ref Range Status   Campylobacter species NOT DETECTED NOT DETECTED  Final   Plesimonas shigelloides NOT DETECTED NOT DETECTED Final   Salmonella species NOT DETECTED NOT DETECTED Final   Yersinia enterocolitica NOT DETECTED NOT DETECTED Final   Vibrio species NOT  DETECTED NOT DETECTED Final   Vibrio cholerae NOT DETECTED NOT DETECTED Final   Enteroaggregative E coli (EAEC) NOT DETECTED NOT DETECTED Final   Enteropathogenic E coli (EPEC) NOT DETECTED NOT DETECTED Final   Enterotoxigenic E coli (ETEC) NOT DETECTED NOT DETECTED Final   Shiga like toxin producing E coli (STEC) NOT DETECTED NOT DETECTED Final   Shigella/Enteroinvasive E coli (EIEC) NOT DETECTED NOT DETECTED Final   Cryptosporidium NOT DETECTED NOT DETECTED Final   Cyclospora cayetanensis NOT DETECTED NOT DETECTED Final   Entamoeba histolytica NOT DETECTED NOT DETECTED Final   Giardia lamblia NOT DETECTED NOT DETECTED Final   Adenovirus F40/41 NOT DETECTED NOT DETECTED Final   Astrovirus NOT DETECTED NOT DETECTED Final   Norovirus GI/GII NOT DETECTED NOT DETECTED Final   Rotavirus A NOT DETECTED NOT DETECTED Final   Sapovirus (I, II, IV, and V) NOT DETECTED NOT DETECTED Final  Culture, respiratory (NON-Expectorated)     Status: None   Collection Time: 04/12/17 12:00 PM  Result Value Ref Range Status   Specimen Description TRACHEAL ASPIRATE  Final   Special Requests NONE  Final   Gram Stain   Final    MODERATE WBC PRESENT,BOTH PMN AND MONONUCLEAR MODERATE YEAST Performed at [facility], [address], [place], [place] [address]    Culture MODERATE CANDIDA TROPICALIS  Final   Report Status 04/14/2017 FINAL  Final  Culture, respiratory (NON-Expectorated)     Status: None   Collection Time: 04/15/17  7:48 AM  Result Value Ref Range Status   Specimen Description TRACHEAL ASPIRATE  Final   Special Requests Normal  Final   Gram Stain   Final    RARE WBC PRESENT, PREDOMINANTLY PMN FEW BUDDING YEAST SEEN Performed at [facility], [place] [address], [place], [place] [address]    Culture ABUNDANT CANDIDA TROPICALIS  Final   Report Status 04/18/2017 FINAL  Final  CULTURE, BLOOD (ROUTINE X 2) w Reflex to ID Panel     Status: None   Collection Time: 04/15/17 10:46 AM  Result  Value Ref Range Status   Specimen Description BLOOD LEFT SUBCLAVIAN TRIPLE LUMEN  Final   Special Requests   Final    BOTTLES DRAWN AEROBIC AND ANAEROBIC Blood Culture results may not be optimal due to an excessive volume of blood received in culture bottles   Culture NO GROWTH 5 DAYS  Final   Report Status 04/20/2017 FINAL  Final  CULTURE, BLOOD (ROUTINE X 2) w Reflex to ID Panel     Status: None   Collection Time: 04/16/17  1:21 PM  Result Value Ref Range Status   Specimen Description BLOOD BLOOD LEFT HAND  Final   Special Requests   Final    BOTTLES DRAWN AEROBIC AND ANAEROBIC Blood Culture results may not be optimal due to an inadequate volume of blood received in culture bottles   Culture NO GROWTH 5 DAYS  Final   Report Status 04/21/2017 FINAL  Final  CULTURE, BLOOD (ROUTINE X 2) w Reflex to ID Panel     Status: None   Collection Time: 04/16/17  1:27 PM  Result Value Ref Range Status   Specimen Description BLOOD A-LINE DRAW  Final   Special Requests   Final    BOTTLES DRAWN AEROBIC AND ANAEROBIC  Blood Culture adequate volume   Culture NO GROWTH 5 DAYS  Final   Report Status 04/21/2017 FINAL  Final  C difficile quick scan w PCR reflex     Status: None   Collection Time: 04/16/17 10:15 PM  Result Value Ref Range Status   C Diff antigen NEGATIVE NEGATIVE Final   C Diff toxin NEGATIVE NEGATIVE Final   C Diff interpretation No C. difficile detected.  Final    Comment: VALID    Coagulation Studies: No results for input(s): LABPROT, INR in the last 72 hours.  Urinalysis: No results for input(s): COLORURINE, LABSPEC, PHURINE, GLUCOSEU, HGBUR, BILIRUBINUR, KETONESUR, PROTEINUR, UROBILINOGEN, NITRITE, LEUKOCYTESUR in the last 72 hours.  Invalid input(s): APPERANCEUR    Imaging: No results found.   Medications:   . norepinephrine (LEVOPHED) Adult infusion 8.96 mcg/min (04/26/17 0800)  . pureflow 2,000 mL/hr at 04/24/17 2300   . amiodarone  200 mg Per Tube BID  .  carvedilol  3.125 mg Per Tube BID WC  . famotidine  20 mg Per Tube QHS  . levothyroxine  100 mcg Per Tube QAC breakfast  . mouth rinse  15 mL Mouth Rinse BID  . midodrine  10 mg Oral TID WC  . multivitamin  1 tablet Oral QHS  . potassium & sodium phosphates  2 packet Oral TID  . QUEtiapine  25 mg Per Tube QHS  . silver sulfADIAZINE   Topical BID   acetaminophen, artificial tears, heparin, ipratropium-albuterol, LORazepam, metoprolol tartrate, morphine injection, [DISCONTINUED] ondansetron **OR** ondansetron (ZOFRAN) IV, phenol, sodium chloride flush, zinc oxide  Assessment/ Plan:  [name] is a 66 y.o. white female  with hypertension, hypothyroidism, atrial fibrillation, who was admitted to [facility] on 04/08/2017 for evaluation of increasing lower extremity edema.  Hospital course: Admitted on 04/08/2017 with significant peripheral edema and cellulitis. Started on CRRT for hypotension and anuric renal failure on 7/2. Patient weaned off CRRT on 7/4. However later that day, patient had cardiac arrest and coded. Intubated, sedated and restarted on three vasopressors. Restart on CRRT on 7/4.  2-D echo which shows EF 25-30%  1. Acute renal failure with proteinuria and hematuria: baseline creatinine of 0.62 09/2014.  Patient with massive peripheral edema, anasarca and hypoalbuminemia.  Recent IV contrast exposure on 04/04/17.  - Serologic work up: compliments normal, negative SPEP/UPEP, ANA negative, ANCA negative, GBM negative, negative hepatitis -  Nursing reports that the patient does not have any urine output. Therefore she will need continued dialysis. We have transitioned her to intermittent hemodialysis. We will plan for dialysis again tomorrow.  2. 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 Echocardiogram with EF of 25-30% - Midodrine added to her medication regimen. Continue to  monitor pressure.  3. Acute respiratory failure Extubated 7/9 - Patient continues to do well off the ventilator. Continue to monitor respiratory status and continue to perform ultrafiltration to aid in lung function.  4. 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 - Management of blood sugars as per hospitalist and pulmonary/critical care  5. Generalized edema.  Significantly improved since admission. We plan for continued ultrafiltration with intermittent hemodialysis.   LOS: 18 Donielle Kaigler 7/19/201810:18 AM

## 2017-04-26 NOTE — Progress Notes (Signed)
PULMONARY / CRITICAL CARE MEDICINE   Name: [name] MRN: [id number] DOB: 05-10-1951    ADMISSION DATE:  04/08/2017  PT PROFILE:   66 y.o. F with PMH of CAF, hypothyroidism admitted via ED to ICU with many days of progressive LE edema, blistering lesions on BLE, progressive weakness. Adm diagnosis of septic shock, cellulitis, AKI.  Pt PEA arrested 07/4 ACLS protocol initiated ROSC within 2 minutes following interventions intubated during cardiac arrest and extubated 07/9.   MAJOR EVENTS/TEST RESULTS: 07/01 LE venous US: no DVT 07/01 Echocardiogram:  LVEF 25-30% 07/01 Nephrology consultation 07/02 ID consultation 07/02 CRRT initiated 07/03 Off vasopressors. Worsening tachycardia - amiodarone initiated 07/04 Pt PEA arrest ACLS protocol initiated ROSC 2 minutes now back on vasopressors  07/05 Intubated, shock req high dose vasopressors, on CRRT, elevated LFTs c/w congestion/shock liver, pulmonary edema vs ARDS on CXR 07/09 Pt extubated  07/11 remains on CRRT and vasopressors, resp status stable 07/12 remains on CRRT, on amiodarone. Dr Mortimer Fries addressed goals of care with pt's husband. Made DNR/DNI, continue current management without significant escalation. NGT placed for nutrition 07/13 off vasopressors, cont on CRRT with increased UF to 200/hr.  07/14 PCCM/Nephrology agree to continue CRRT through [facility]. 07/15 Increased confusion and intermittent agitation. Quetiapine initiated 07/16 Remains on CRRT, ID signed off  07/18 CRRT stopped pt transitioned to HD  07/19 Dobhoff discontinued and diet advanced   INDWELLING DEVICES:: R fem HD cath 07/02 >>out  ETT 07/04 >>07/09 Left IJ 07/04 >>  R IJ HD cath 07/12 >>   MICRO DATA: MRSA PCR 07/01 >> NEG Urine 07/01 >> NEG Blood 07/01 >> NEG C diff 07/01 >> NEG GI panel 07/02 >> NEG Resp 07/05 >> moderate candida tropicalis  Blood 07/08 >> NEG Blood 07/09 >> NEG Resp 07/08 >> abundant candida tropicalis Cdiff 07/09 >> NEG   ANTIMICROBIALS:   Vanc 07/01 >> 07/02 Pip-tazo 07/01 >> 7/10 Vancomycin 07/6 >>7/10  SUBJECTIVE:  No acute issues overnight   VITAL SIGNS: BP 94/72   Pulse 91   Temp 100.2 F (37.9 C)   Resp (!) 21   Ht 5\' 5"  (1.651 m)   Wt 79.3 kg (174 lb 13.2 oz)   SpO2 90%   BMI 29.09 kg/m   VENTILATOR SETTINGS:    INTAKE / OUTPUT: I/O last 3 completed shifts: In: 2138.1 [I.V.:225.2; Blood:350; Other:350; NG/GT:1162.9; IV Piggyback:50] Out: 2148 [Other:2148]  PHYSICAL EXAMINATION: General: NAD Neuro: RASS 0, no focal deficits HEENT: NCAT, sclericterus Cardiovascular: IRIR, no murmurs noted Lungs: Clear anteriorly, even, non labored; no wheezes, rales, rhonchi, or crackles  Abdomen: obese, soft, ND, NT  Ext: improved BUE and BLE edema  LABS:  BMET  Recent Labs Lab 04/25/17 0441 04/25/17 1730 04/26/17 0522  NA 138 140 138  K 4.4 4.0 4.3  CL 101 100* 100*  CO2 26 29 27   BUN 48* 24* 50*  CREATININE 1.03* 0.55 1.05*  GLUCOSE 98 110* 129*    Electrolytes  Recent Labs Lab 04/24/17 2215 04/25/17 0441 04/25/17 1730 04/25/17 2335 04/26/17 0522  CALCIUM 8.4* 8.7* 8.6*  --  8.2*  MG 2.0 1.9 1.8  --   --   PHOS 2.7 3.3 2.8 3.8  --     CBC  Recent Labs Lab 04/23/17 0437 04/25/17 0441 04/26/17 0742  WBC 9.9 8.6 10.0  HGB 7.4* 6.8* 8.6*  HCT 22.6* 20.6* 25.6*  PLT 73* 66* 103*    Coag's  Recent Labs Lab 04/24/17 0456 04/25/17 0441 04/26/17 0522  APTT 36 26 33    Sepsis Markers No results for input(s): LATICACIDVEN, PROCALCITON, O2SATVEN in the last 168 hours.  ABG  Recent Labs Lab 04/19/17 1031  PHART 7.52*  PCO2ART 26*  PO2ART 65*    Liver Enzymes  Recent Labs Lab 04/21/17 0500  04/23/17 0437  04/24/17 2215 04/25/17 0441 04/25/17 1730  AST 67*  --  83*  --   --  66*  --   ALT 190*  --  139*  --   --  93*  --   ALKPHOS 462*  --  421*  --   --  322*  --   BILITOT 4.0*  --  3.2*  --   --  2.9*  --   ALBUMIN 3.5  < > 4.0  4.0  < > 3.8 4.1  4.1 4.3   < > = values in this interval not displayed.  Cardiac Enzymes No results for input(s): TROPONINI, PROBNP in the last 168 hours.  Glucose  Recent Labs Lab 04/24/17 1600 04/24/17 2338 04/25/17 0746 04/25/17 1612 04/25/17 2356 04/26/17 0755  GLUCAP 116* 115* 99 106* 137* 131*    CXR: NNF  ASSESSMENT / PLAN: Acute hypoxic respiratory failure-slowly resolving Pulmonary edema Severe dilated cardiomyopathy Chronic AF Status post PEA arrest 07/04  Cardiogenic shock, resolved Anuric AKI - likely cardiorenal plus ATN Anasarca, improving Protein-calorie malnutrition Elevated LFTs due to shock liver, hepatic congestion - improving Diarrhea, resolved Anemia without evidence of acute blood loss Thrombocytopenia-improving  Elevated prothrombin time, resolved LE cellulitis - fully treated Severe deconditioning  Plan: Continue supplemental oxygen to maintain O2 sats >92% HD per Nephrology recommendations  Prn levophed to maintain map goal >60 Increased midodrine dose and frequency-07/19 Continue amiodarone, synthroid, and carvedilol  Monitor BMET intermittently Monitor I/Os Correct electrolytes as indicated DVT px: SCDs Monitor CBC intermittently Transfuse per usual guidelines Discontinue tube feeds advance diet  Continue physical therapy-goal OOB to Chair  DNR/DNI  Vascular surgery consulted for permcath placement    [name], [place] Pager [phone] (please enter 7 digits) PCCM Consult Pager [phone] (please enter 7 digits)   PCCM ATTENDING ATTESTATION:  I have evaluated patient with the APP [facility]akeney, reviewed database in its entirety and discussed care plan in detail. In addition, this patient was discussed on multidisciplinary rounds.   Overall, she is making gradual improvement. However, she remains on low-dose vasopressors. The biggest concern today is a new fever. Most likely source of this would be line infection. Hopefully  she can get her PermCath placed today in the HD cath can be removed. Hopefully, a peripheral IV can be placed and the triple-lumen catheter can also be removed. She will need LTAC and we will begin pursuing this.  Merton Border, MD PCCM service Mobile [phone] Pager [phone] 04/26/2017 12:15 PM

## 2017-04-26 NOTE — Progress Notes (Signed)
Wound RN Santiago Glad paged d/t eschar appearance of left foot, possible need for [facility]

## 2017-04-26 NOTE — Progress Notes (Signed)
Daily Progress Note   Patient Name: [name]       Date: 04/26/2017 DOB: [birth year]  Age: 66 y.o. MRN#: [id number] Attending Physician: Epifanio Lesches, MD Primary Care Physician: Tracie Harrier, MD Admit Date: 04/08/2017  Reason for Consultation/Follow-up: Establishing goals of care  Subjective/GOC: Patient awake, alert, and oriented. Appears in good spirits and smiling this afternoon. Husband and Mother-in-law at bedside.  Asked her thoughts on long-term dialysis. "It will be an adjustment." She is motivated to eat and get out of bed soon. Tells me she will eat more with the tube out. Speaks highly of her family and church community who have continued to pray for her recovery. "It will be a long road."   Length of Stay: 18  Current Medications: Scheduled Meds:  . amiodarone  200 mg Per Tube BID  . carvedilol  3.125 mg Per Tube BID WC  . collagenase   Topical Daily  . famotidine  20 mg Per Tube QHS  . feeding supplement  1 Container Oral TID BM  . levothyroxine  100 mcg Per Tube QAC breakfast  . mouth rinse  15 mL Mouth Rinse BID  . midodrine  10 mg Oral TID WC  . multivitamin  1 tablet Oral QHS  . potassium & sodium phosphates  2 packet Oral TID  . QUEtiapine  25 mg Per Tube QHS  . silver sulfADIAZINE   Topical BID    Continuous Infusions: . sodium chloride    . sodium chloride    .  ceFAZolin (ANCEF) IV    . norepinephrine (LEVOPHED) Adult infusion 8.96 mcg/min (04/26/17 1200)  . pureflow 2,000 mL/hr at 04/24/17 2300    PRN Meds: acetaminophen, artificial tears, heparin, ipratropium-albuterol, LORazepam, metoprolol tartrate, morphine injection, [DISCONTINUED] ondansetron **OR** ondansetron (ZOFRAN) IV, phenol, sodium chloride flush, zinc oxide  Physical Exam    Constitutional: She is oriented to person, place, and time. She is cooperative.  HENT:  Head: Normocephalic and atraumatic.  Cardiovascular: Regular rhythm.   Pulmonary/Chest: Effort normal and breath sounds normal.  Abdominal: Normal appearance.  Neurological: She is alert and oriented to person, place, and time.  Skin: Skin is warm and dry.  Psychiatric: She has a normal mood and affect. Her speech is normal and behavior is normal. Cognition and memory are normal.  Nursing note and  vitals reviewed.          Vital Signs: BP 93/68 (BP Location: Right Arm)   Pulse (!) 104   Temp 100.2 F (37.9 C)   Resp (!) 22   Ht 5\' 5"  (1.651 m)   Wt 79.3 kg (174 lb 13.2 oz)   SpO2 94%   BMI 29.09 kg/m  SpO2: SpO2: 94 % O2 Device: O2 Device: Not Delivered O2 Flow Rate: O2 Flow Rate (L/min): 94 L/min  Intake/output summary:   Intake/Output Summary (Last 24 hours) at 04/26/17 1521 Last data filed at 04/26/17 1200  Gross per 24 hour  Intake          2450.85 ml  Output                0 ml  Net          2450.85 ml   LBM: Last BM Date: 04/25/17 Baseline Weight: Weight: 86.2 kg (190 lb) Most recent weight: Weight: 79.3 kg (174 lb 13.2 oz)  Palliative Assessment/Data: PPS 30%   Flowsheet Rows     Most Recent Value  Intake Tab  Referral Department  Critical care  Unit at Time of Referral  ICU  Palliative Care Primary Diagnosis  Other (Comment) [multiorgan failure]  Date Notified  04/13/17  Palliative Care Type  New Palliative care  Reason for referral  Clarify Goals of Care, End of Life Care Assistance  Date of Admission  04/08/17  Date first seen by Palliative Care  04/13/17  # of days Palliative referral response time  0 Day(s)  # of days IP prior to Palliative referral  5  Clinical Assessment  Palliative Performance Scale Score  30%  Psychosocial & Spiritual Assessment  Palliative Care Outcomes  Patient/Family meeting held?  Yes  Who was at the meeting?  husband dtr   Palliative Care Outcomes  Clarified goals of care      Patient Active Problem List   Diagnosis Date Noted  . Pressure injury of skin 04/19/2017  . Acute renal failure (ARF) ([place])   . Cellulitis of lower extremity   . Edema extremities   . Acute respiratory failure ([place])   . Acute renal failure ([place])   . Multi-organ failure with heart failure ([place])   . Palliative care by specialist   . Goals of care, counseling/discussion   . Septic shock ([place]) 04/08/2017    Palliative Care Assessment & Plan   Patient Profile: 66 y.o. female  with past medical history of hypertension, hypothyroidism, atrial fibrillation, and anxiety admitted on 04/08/2017 with lower extremity edema and progressive weakness. Admitting diagnoses of septic shock, cellulitis, and acute kidney injury. Negative for DVT's. Echo revealed EF 25-30%. Nephrology and ID consultations. CRRT initiated on 04/09/17. On 04/11/17, patient with PEA arrest. ROSC in 2 minutes, intubated, and patient restarted on pressors. Now with cardiogenic shock, AKI remains on CRRT, elevated LFT's secondary to shock and hepatic congestion, thrombocytopenia, and worsening PCT likely due to LE cellulitis and superimposed pneumonia. Palliative medicine consult for goals of care. Patient off CRRT and now intermittent HD.   Assessment: Acute hypoxic respiratory failure-improving Pulmonary edema Severe dilated cardiomyopathy Anuric AKI (cardiorenal/ATN) Chronic atrial fibrillation Cardiogenic shock resolved Protein-calorie malnutrition Elevated LFT's due to liver shock improving Anemia Thrombocytopenia LE cellulitis  Recommendations/Plan:  Continue full scope treatment.   Likely LTACH placement.  May benefit from palliative to continue to follow on discharge.  PMT will continue to support patient/family through hospitalization.   Code Status: DNR  Code Status Orders        Start     Ordered   04/19/17 1050  Do not attempt resuscitation  (DNR)  Continuous    Question Answer Comment  In the event of cardiac or respiratory ARREST Do not call a "code blue"   In the event of cardiac or respiratory ARREST Do not perform Intubation, CPR, defibrillation or ACLS   In the event of cardiac or respiratory ARREST Use medication by any route, position, wound care, and other measures to relive pain and suffering. May use oxygen, suction and manual treatment of airway obstruction as needed for comfort.      04/19/17 1050    Code Status History    Date Active Date Inactive Code Status Order ID Comments User Context   04/08/2017  6:50 AM 04/19/2017 10:50 AM Full Code [id number]  Harrie Foreman, MD Inpatient       Prognosis:   Unable to determine  Discharge Planning:  To Be Determined [facility]  Care plan was discussed with patient, husband, and Hinton Dyer, NP  Thank you for allowing the Palliative Medicine Team to assist in the care of this patient.   Time In: 1325 Time Out: 1350 Total Time 52min Prolonged Time Billed no       Greater than 50%  of this time was spent counseling and coordinating care related to the above assessment and plan.  Ihor Dow, FNP-C Palliative Medicine Team  Phone: [phone] Fax: [phone]  Please contact Palliative Medicine Team phone at [phone] for questions and concerns.

## 2017-04-26 NOTE — Progress Notes (Signed)
Pt will get perm cath today per Dr Alva Garnet, take lunch tray and hold NPO, afterward pt can have Boost instead of Ensure

## 2017-04-26 NOTE — Progress Notes (Signed)
Per Dr simonds page vascular to insert perm cath, we may need to pull CL and Vas Cath d/t febrile, insert PIV, consult IV Team if needed

## 2017-04-26 NOTE — Progress Notes (Signed)
Nutrition Follow-up  DOCUMENTATION CODES:   Obesity unspecified  INTERVENTION:   Recommend PEG placement if pt unable to meet estimated needs via oral intake.   Continue Ensure Enlive po TID between meals, each supplement provides 350 kcal and 20 grams of protein.   Continue Magic cup TID with meals, each supplement provides 290 kcal and 9 grams of protein.   Recommend renal multivitamin with minerals (Rena-vite)   NUTRITION DIAGNOSIS:   Inadequate oral intake related to poor appetite, other (see comment) (diarrhea) as evidenced by per patient/family report, meal completion < 25%.  Ongoing.  GOAL:   Patient will meet greater than or equal to 90% of their needs  -Not meeting  MONITOR:   PO intake, Supplement acceptance, Labs, Weight trends, I & O's  ASSESSMENT:   66 y.o. white female  with hypertension, hypothyroidism, atrial fibrillation, who was admitted to [facility] on 04/08/2017 for evaluation of increasing lower extremity edema. Admitted on 04/08/2017 with significant peripheral edema and cellulitis. Started on CRRT for hypotension and anuric renal failure on 7/2. Patient weaned off CRRT on 7/4. However later that day, patient had cardiac arrest and coded. Intubated, sedated and restarted on three vasopressors. Restart on CRRT on 7/4.    Pt discontinued from CRRT; plans for perm cath placement and to initiate HD today. NGT discontinued. Pt not meeting estimated needs via oral intake. Pt ate a [employer] for breakfast today. Pt may need long term PEG placement to meet estimated needs pending goals of care. Plan is for pt to discharge to [place]. Pt's weight appears stable since admit; unsure if any true weight loss as pt with anasarca.   Medications reviewed and include: pepcid, synthroid, MVI, KPhos, levophed  Labs reviewed: Cl 100(L), BUN 50(H), creat 1.05(H), Ca 8.2(L) P 3.8 wnl-7/18, Mg 1.8 wnl- 7/18 Hgb 8.6(L), Hct 25.6(L)  Diet Order:  Diet NPO time specified  Skin:  Wound  (see comment) (Blisters to BLE)  Last BM:  7/18  Height:   Ht Readings from Last 1 Encounters:  04/26/17 5\' 5"  (1.651 m)    Weight:   Wt Readings from Last 1 Encounters:  04/26/17 174 lb 13.2 oz (79.3 kg)    Ideal Body Weight:  56.81 kg  BMI:  Body mass index is 29.09 kg/m.  Estimated Nutritional Needs:   Kcal:  1700-2000kcal/day   Protein:  114-125g/day   Fluid:  >1.7L/day or per MD  EDUCATION NEEDS:   Education needs no appropriate at this time  Koleen Distance MS, RD, [place] Pager #[phone] After Hours Pager: [phone]

## 2017-04-26 NOTE — H&P (Signed)
[facility] History & Physical Update  The patient was interviewed and re-examined.  The patient's previous History and Physical has been reviewed and is unchanged. We have been asked to place a permcath by Nephrology and critical care services. There is no change in the plan of care. We plan to proceed with the scheduled procedure.  Leotis Pain, MD  04/26/2017, 3:42 PM

## 2017-04-27 ENCOUNTER — Encounter: Payer: Self-pay | Admitting: Vascular Surgery

## 2017-04-27 DIAGNOSIS — N170 Acute kidney failure with tubular necrosis: Secondary | ICD-10-CM

## 2017-04-27 LAB — CBC WITH DIFFERENTIAL/PLATELET
Basophils Absolute: 0.1 10*3/uL (ref 0–0.1)
Basophils Relative: 1 %
EOS PCT: 1 %
Eosinophils Absolute: 0.1 10*3/uL (ref 0–0.7)
HCT: 24.6 % — ABNORMAL LOW (ref 35.0–47.0)
Hemoglobin: 8.3 g/dL — ABNORMAL LOW (ref 12.0–16.0)
LYMPHS ABS: 1.3 10*3/uL (ref 1.0–3.6)
LYMPHS PCT: 16 %
MCH: 32.4 pg (ref 26.0–34.0)
MCHC: 33.6 g/dL (ref 32.0–36.0)
MCV: 96.4 fL (ref 80.0–100.0)
MONO ABS: 1.1 10*3/uL — AB (ref 0.2–0.9)
Monocytes Relative: 13 %
Neutro Abs: 5.9 10*3/uL (ref 1.4–6.5)
Neutrophils Relative %: 69 %
PLATELETS: 104 10*3/uL — AB (ref 150–440)
RBC: 2.55 MIL/uL — ABNORMAL LOW (ref 3.80–5.20)
RDW: 21.4 % — AB (ref 11.5–14.5)
WBC: 8.4 10*3/uL (ref 3.6–11.0)

## 2017-04-27 LAB — BASIC METABOLIC PANEL
Anion gap: 13 (ref 5–15)
BUN: 76 mg/dL — AB (ref 6–20)
CALCIUM: 7.7 mg/dL — AB (ref 8.9–10.3)
CO2: 25 mmol/L (ref 22–32)
Chloride: 100 mmol/L — ABNORMAL LOW (ref 101–111)
Creatinine, Ser: 1.96 mg/dL — ABNORMAL HIGH (ref 0.44–1.00)
GFR calc Af Amer: 30 mL/min — ABNORMAL LOW (ref >60)
GFR, EST NON AFRICAN AMERICAN: 26 mL/min — AB (ref >60)
GLUCOSE: 77 mg/dL (ref 65–99)
POTASSIUM: 4.7 mmol/L (ref 3.5–5.1)
Sodium: 138 mmol/L (ref 135–145)

## 2017-04-27 LAB — GLUCOSE, CAPILLARY
Glucose-Capillary: 121 mg/dL — ABNORMAL HIGH (ref 65–99)
Glucose-Capillary: 67 mg/dL (ref 65–99)
Glucose-Capillary: 75 mg/dL (ref 65–99)
Glucose-Capillary: 77 mg/dL (ref 65–99)
Glucose-Capillary: 84 mg/dL (ref 65–99)
Glucose-Capillary: 90 mg/dL (ref 65–99)

## 2017-04-27 LAB — PHOSPHORUS
PHOSPHORUS: 7.4 mg/dL — AB (ref 2.5–4.6)
Phosphorus: 4.5 mg/dL (ref 2.5–4.6)

## 2017-04-27 LAB — APTT: aPTT: 33 seconds (ref 24–36)

## 2017-04-27 MED ORDER — ALBUMIN HUMAN 25 % IV SOLN
25.0000 g | INTRAVENOUS | Status: DC
  Administered 2017-04-30 – 2017-05-02 (×2): 25 g via INTRAVENOUS
  Filled 2017-04-27 (×6): qty 100

## 2017-04-27 NOTE — Progress Notes (Signed)
Pre hd assessment  

## 2017-04-27 NOTE — Progress Notes (Signed)
[place] for Electrolyte Replacement    Pharmacy consulted for electrolyte management for 66 yo female previously requiring CRRT. Patient off CRRT and being converted to intermittent hemodialysis.   Plan:  No replacement warranted. Will recheck electrolytes with am labs.   No Known Allergies  Patient Measurements: Height: 5\' 5"  (165.1 cm) Weight: 174 lb (78.9 kg) IBW/kg (Calculated) : 57 Vital Signs: Temp: 97.7 F (36.5 C) (07/20 1100) Temp Source: Rectal (07/20 0400) BP: 85/57 (07/20 1200) Pulse Rate: 97 (07/20 1200) Intake/Output from previous day: 07/19 0701 - 07/20 0700 In: 1537.7 [P.O.:508; I.V.:184.7; NG/GT:845] Out: 0  Intake/Output from this shift: No intake/output data recorded.  Labs:  Recent Labs  04/24/17 2215 04/25/17 0441 04/25/17 1730 04/25/17 2335 04/26/17 0522 04/26/17 0742 04/27/17 0415  WBC  --  8.6  --   --   --  10.0 8.4  HGB  --  6.8*  --   --   --  8.6* 8.3*  HCT  --  20.6*  --   --   --  25.6* 24.6*  PLT  --  66*  --   --   --  103* 104*  APTT  --  26  --   --  33  --  33  CREATININE 0.96 1.03* 0.55  --  1.05*  --  1.96*  MG 2.0 1.9 1.8  --   --   --   --   PHOS 2.7 3.3 2.8 3.8  --   --   --   ALBUMIN 3.8 4.1  4.1 4.3  --   --   --   --   PROT  --  6.3*  --   --   --   --   --   AST  --  66*  --   --   --   --   --   ALT  --  93*  --   --   --   --   --   ALKPHOS  --  322*  --   --   --   --   --   BILITOT  --  2.9*  --   --   --   --   --   BILIDIR  --  1.3*  --   --   --   --   --   IBILI  --  1.6*  --   --   --   --   --     Lab Results  Component Value Date   K 4.7 04/27/2017    Estimated Creatinine Clearance: 29.7 mL/min (A) (by C-G formula based on SCr of 1.96 mg/dL (H)).   Potassium (mmol/L)  Date Value  04/27/2017 4.7  09/19/2014 4.1   Calcium (mg/dL)  Date Value  04/27/2017 7.7 (L)   Calcium, Total (mg/dL)  Date Value  09/19/2014 8.3 (L)   Sodium (mmol/L)  Date Value   04/27/2017 138  09/19/2014 140    Pharmacy will continue to monitor and adjust per consult.   Ulice Dash, PharmD Clinical Pharmacist  04/27/17 1:32 PM

## 2017-04-27 NOTE — Progress Notes (Signed)
[facility]  ROUNDING NOTE   Subjective:  Patient seen at bedside. BUN and creatinine are higher especially off of dialysis. Still no urine output. We are planning for hemodialysis today. Attempts are being made to wean pressors.  Objective:  Vital signs in last 24 hours:  Temp:  [97.3 F (36.3 C)-100.2 F (37.9 C)] 97.3 F (36.3 C) (07/20 0600) Pulse Rate:  [86-112] 93 (07/20 0600) Resp:  [8-24] 9 (07/20 0600) BP: (72-108)/(57-75) 83/71 (07/20 0600) SpO2:  [91 %-99 %] 96 % (07/20 0600) Weight:  [78.9 kg (174 lb)] 78.9 kg (174 lb) (07/19 1530)  Weight change: -1 kg (-2 lb 3.3 oz) Filed Weights   04/25/17 1000 04/26/17 0900 04/26/17 1530  Weight: 80.3 kg (177 lb 0.5 oz) 79.3 kg (174 lb 13.2 oz) 78.9 kg (174 lb)    Intake/Output: I/O last 3 completed shifts: In: 1537.7 [P.O.:508; I.V.:184.7; NG/GT:845] Out: 0    Intake/Output this shift:  No intake/output data recorded.  Physical Exam: General: No acute distress   Head: Normocephalic, atraumatic  Eyes: EOM intact  Neck: supple  Lungs:  Basilar rales, normal effort  Heart: Irregular  Abdomen:  Soft, nontender, obese  Extremities: 1+ bilateral lower extremity edema   Neurologic: Awake, alert, following commands   Skin: Warm, dry  Access: Right IJ temp cath 7/12 [place])    Basic Metabolic Panel:  Recent Labs Lab 04/24/17 1241 04/24/17 1452 04/24/17 2215 04/25/17 0441 04/25/17 1730 04/25/17 2335 04/26/17 0522 04/27/17 0415  NA 134*  --  135 138 140  --  138 138  K 4.7  --  4.2 4.4 4.0  --  4.3 4.7  CL 100*  --  100* 101 100*  --  100* 100*  CO2 26  --  27 26 29   --  27 25  GLUCOSE 108*  --  112* 98 110*  --  129* 77  BUN 56*  --  51* 48* 24*  --  50* 76*  CREATININE 1.02*  --  0.96 1.03* 0.55  --  1.05* 1.96*  CALCIUM 8.4*  --  8.4* 8.7* 8.6*  --  8.2* 7.7*  MG 10.0* 3.0* 2.0 1.9 1.8  --   --   --   PHOS 2.4*  --  2.7 3.3 2.8 3.8  --   --     Liver Function Tests:  Recent  Labs Lab 04/21/17 0500  04/23/17 0437  04/24/17 0456 04/24/17 1241 04/24/17 2215 04/25/17 0441 04/25/17 1730  AST 67*  --  83*  --   --   --   --  66*  --   ALT 190*  --  139*  --   --   --   --  93*  --   ALKPHOS 462*  --  421*  --   --   --   --  322*  --   BILITOT 4.0*  --  3.2*  --   --   --   --  2.9*  --   PROT 6.2*  --  6.5  --   --   --   --  6.3*  --   ALBUMIN 3.5  < > 4.0  4.0  < > 4.0 3.8 3.8 4.1  4.1 4.3  < > = values in this interval not displayed. No results for input(s): LIPASE, AMYLASE in the last 168 hours. No results for input(s): AMMONIA in the last 168 hours.  CBC:  Recent Labs Lab 04/22/17  2706 04/23/17 0437 04/25/17 0441 04/26/17 0742 04/27/17 0415  WBC 10.8 9.9 8.6 10.0 8.4  NEUTROABS  --   --   --  7.8* 5.9  HGB 7.6* 7.4* 6.8* 8.6* 8.3*  HCT 23.0* 22.6* 20.6* 25.6* 24.6*  MCV 97.9 97.4 98.4 95.9 96.4  PLT 59* 73* 66* 103* 104*    Cardiac Enzymes: No results for input(s): CKTOTAL, CKMB, CKMBINDEX, TROPONINI in the last 168 hours.  BNP: Invalid input(s): POCBNP  CBG:  Recent Labs Lab 04/25/17 2356 04/26/17 0755 04/26/17 1650 04/27/17 0005 04/27/17 0814  GLUCAP 137* 131* 100* 121* 75    Microbiology: Results for orders placed or performed during the hospital encounter of 04/08/17  Culture, blood (routine x 2)     Status: None   Collection Time: 04/08/17  2:04 AM  Result Value Ref Range Status   Specimen Description BLOOD RIGHT ANTECUBITAL  Final   Special Requests   Final    BOTTLES DRAWN AEROBIC AND ANAEROBIC Blood Culture adequate volume   Culture NO GROWTH 5 DAYS  Final   Report Status 04/13/2017 FINAL  Final  Culture, blood (routine x 2)     Status: None   Collection Time: 04/08/17  2:06 AM  Result Value Ref Range Status   Specimen Description BLOOD LEFT ANTECUBITAL  Final   Special Requests   Final    BOTTLES DRAWN AEROBIC AND ANAEROBIC Blood Culture adequate volume   Culture NO GROWTH 5 DAYS  Final   Report Status  04/13/2017 FINAL  Final  C difficile quick scan w PCR reflex     Status: None   Collection Time: 04/08/17  5:09 AM  Result Value Ref Range Status   C Diff antigen NEGATIVE NEGATIVE Final   C Diff toxin NEGATIVE NEGATIVE Final   C Diff interpretation No C. difficile detected.  Final  MRSA PCR Screening     Status: None   Collection Time: 04/08/17  6:50 AM  Result Value Ref Range Status   MRSA by PCR NEGATIVE NEGATIVE Final    Comment:        The GeneXpert MRSA Assay (FDA approved for NASAL specimens only), is one component of a comprehensive MRSA colonization surveillance program. It is not intended to diagnose MRSA infection nor to guide or monitor treatment for MRSA infections.   Urine culture     Status: None   Collection Time: 04/08/17 12:16 PM  Result Value Ref Range Status   Specimen Description URINE, RANDOM  Final   Special Requests NONE  Final   Culture   Final    NO GROWTH Performed at [facility], [address], [place], [place] [address]    Report Status 04/10/2017 FINAL  Final  Aerobic Culture (superficial specimen)     Status: None   Collection Time: 04/09/17 11:32 AM  Result Value Ref Range Status   Specimen Description SKIN  Final   Special Requests NONE  Final   Gram Stain   Final    RARE WBC PRESENT, PREDOMINANTLY PMN NO SQUAMOUS EPITHELIAL CELLS SEEN NO ORGANISMS SEEN    Culture   Final    NO GROWTH 2 DAYS Performed at [facility], [place] [address], [place], [place] [address]    Report Status 04/11/2017 FINAL  Final  Gastrointestinal Panel by PCR , Stool     Status: None   Collection Time: 04/09/17  5:10 PM  Result Value Ref Range Status   Campylobacter species NOT DETECTED NOT DETECTED  Final   Plesimonas shigelloides NOT DETECTED NOT DETECTED Final   Salmonella species NOT DETECTED NOT DETECTED Final   Yersinia enterocolitica NOT DETECTED NOT DETECTED Final   Vibrio species NOT DETECTED NOT DETECTED Final   Vibrio cholerae NOT  DETECTED NOT DETECTED Final   Enteroaggregative E coli (EAEC) NOT DETECTED NOT DETECTED Final   Enteropathogenic E coli (EPEC) NOT DETECTED NOT DETECTED Final   Enterotoxigenic E coli (ETEC) NOT DETECTED NOT DETECTED Final   Shiga like toxin producing E coli (STEC) NOT DETECTED NOT DETECTED Final   Shigella/Enteroinvasive E coli (EIEC) NOT DETECTED NOT DETECTED Final   Cryptosporidium NOT DETECTED NOT DETECTED Final   Cyclospora cayetanensis NOT DETECTED NOT DETECTED Final   Entamoeba histolytica NOT DETECTED NOT DETECTED Final   Giardia lamblia NOT DETECTED NOT DETECTED Final   Adenovirus F40/41 NOT DETECTED NOT DETECTED Final   Astrovirus NOT DETECTED NOT DETECTED Final   Norovirus GI/GII NOT DETECTED NOT DETECTED Final   Rotavirus A NOT DETECTED NOT DETECTED Final   Sapovirus (I, II, IV, and V) NOT DETECTED NOT DETECTED Final  Culture, respiratory (NON-Expectorated)     Status: None   Collection Time: 04/12/17 12:00 PM  Result Value Ref Range Status   Specimen Description TRACHEAL ASPIRATE  Final   Special Requests NONE  Final   Gram Stain   Final    MODERATE WBC PRESENT,BOTH PMN AND MONONUCLEAR MODERATE YEAST Performed at [facility], [address] [place], [place] [address]    Culture MODERATE CANDIDA TROPICALIS  Final   Report Status 04/14/2017 FINAL  Final  Culture, respiratory (NON-Expectorated)     Status: None   Collection Time: 04/15/17  7:48 AM  Result Value Ref Range Status   Specimen Description TRACHEAL ASPIRATE  Final   Special Requests Normal  Final   Gram Stain   Final    RARE WBC PRESENT, PREDOMINANTLY PMN FEW BUDDING YEAST SEEN Performed at [facility], [place] [address] [place], [place] [address]    Culture ABUNDANT CANDIDA TROPICALIS  Final   Report Status 04/18/2017 FINAL  Final  CULTURE, BLOOD (ROUTINE X 2) w Reflex to ID Panel     Status: None   Collection Time: 04/15/17 10:46 AM  Result Value Ref Range Status   Specimen Description  BLOOD LEFT SUBCLAVIAN TRIPLE LUMEN  Final   Special Requests   Final    BOTTLES DRAWN AEROBIC AND ANAEROBIC Blood Culture results may not be optimal due to an excessive volume of blood received in culture bottles   Culture NO GROWTH 5 DAYS  Final   Report Status 04/20/2017 FINAL  Final  CULTURE, BLOOD (ROUTINE X 2) w Reflex to ID Panel     Status: None   Collection Time: 04/16/17  1:21 PM  Result Value Ref Range Status   Specimen Description BLOOD BLOOD LEFT HAND  Final   Special Requests   Final    BOTTLES DRAWN AEROBIC AND ANAEROBIC Blood Culture results may not be optimal due to an inadequate volume of blood received in culture bottles   Culture NO GROWTH 5 DAYS  Final   Report Status 04/21/2017 FINAL  Final  CULTURE, BLOOD (ROUTINE X 2) w Reflex to ID Panel     Status: None   Collection Time: 04/16/17  1:27 PM  Result Value Ref Range Status   Specimen Description BLOOD A-LINE DRAW  Final   Special Requests   Final    BOTTLES DRAWN AEROBIC AND ANAEROBIC  Blood Culture adequate volume   Culture NO GROWTH 5 DAYS  Final   Report Status 04/21/2017 FINAL  Final  C difficile quick scan w PCR reflex     Status: None   Collection Time: 04/16/17 10:15 PM  Result Value Ref Range Status   C Diff antigen NEGATIVE NEGATIVE Final   C Diff toxin NEGATIVE NEGATIVE Final   C Diff interpretation No C. difficile detected.  Final    Comment: VALID  Culture, blood (Routine X 2) w Reflex to ID Panel     Status: None (Preliminary result)   Collection Time: 04/26/17  7:30 AM  Result Value Ref Range Status   Specimen Description BLOOD PICC LINE  Final   Special Requests   Final    BOTTLES DRAWN AEROBIC AND ANAEROBIC Blood Culture adequate volume   Culture NO GROWTH 1 DAY  Final   Report Status PENDING  Incomplete  Culture, blood (Routine X 2) w Reflex to ID Panel     Status: None (Preliminary result)   Collection Time: 04/26/17  7:30 AM  Result Value Ref Range Status   Specimen Description BLOOD  RIGHT HAND  Final   Special Requests   Final    BOTTLES DRAWN AEROBIC AND ANAEROBIC Blood Culture adequate volume   Culture NO GROWTH 1 DAY  Final   Report Status PENDING  Incomplete    Coagulation Studies: No results for input(s): LABPROT, INR in the last 72 hours.  Urinalysis: No results for input(s): COLORURINE, LABSPEC, PHURINE, GLUCOSEU, HGBUR, BILIRUBINUR, KETONESUR, PROTEINUR, UROBILINOGEN, NITRITE, LEUKOCYTESUR in the last 72 hours.  Invalid input(s): APPERANCEUR    Imaging: No results found.   Medications:   . albumin human    . norepinephrine (LEVOPHED) Adult infusion Stopped (04/27/17 0735)   . amiodarone  200 mg Per Tube BID  . carvedilol  3.125 mg Per Tube BID WC  . collagenase   Topical Daily  . famotidine  20 mg Per Tube QHS  . feeding supplement  1 Container Oral TID BM  . levothyroxine  100 mcg Per Tube QAC breakfast  . mouth rinse  15 mL Mouth Rinse BID  . midodrine  10 mg Oral TID WC  . multivitamin  1 tablet Oral QHS  . potassium & sodium phosphates  2 packet Oral TID  . QUEtiapine  25 mg Per Tube QHS  . silver sulfADIAZINE   Topical BID   acetaminophen, artificial tears, heparin, ipratropium-albuterol, LORazepam, metoprolol tartrate, morphine injection, [DISCONTINUED] ondansetron **OR** ondansetron (ZOFRAN) IV, phenol, sodium chloride flush, zinc oxide  Assessment/ Plan:  [name] is a 66 y.o. white female  with hypertension, hypothyroidism, atrial fibrillation, who was admitted to [facility] on 04/08/2017 for evaluation of increasing lower extremity edema.  Hospital course: Admitted on 04/08/2017 with significant peripheral edema and cellulitis. Started on CRRT for hypotension and anuric renal failure on 7/2. Patient weaned off CRRT on 7/4. However later that day, patient had cardiac arrest and coded. Intubated, sedated and restarted on three vasopressors. Restart on CRRT on 7/4.  2-D echo which shows EF 25-30%  1. Acute renal failure with proteinuria  and hematuria: baseline creatinine of 0.62 09/2014.  Patient with massive peripheral edema, anasarca and hypoalbuminemia.  Recent IV contrast exposure on 04/04/17.  - Serologic work up: compliments normal, negative SPEP/UPEP, ANA negative, ANCA negative, GBM negative, negative hepatitis -  We will plan for hemodialysis today. Albumin support will be given with dialysis to help support blood pressure. Thereafter Dialysis will  be on Monday.  2. 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 Echocardiogram with EF of 25-30% - Pressors have been weaned off for the moment. We may need to resume them during dialysis. In addition albumin has been prescribed for use during dialysis.  3. Acute respiratory failure Extubated 7/9 - Patient breathing comfortably. Continue nasal cannula oxygen.  4. 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 - Management of blood sugars as per hospitalist and pulmonary/critical care  5. Generalized edema.  Ultrafiltration target 1.5-2 kg today as tolerated.   LOS: [place] 7/20/20189:44 AM

## 2017-04-27 NOTE — Care Management (Signed)
Per Doroteo Bradford with [facility] there have not no new updates for LTAC precertification.

## 2017-04-27 NOTE — Progress Notes (Signed)
Attempted to wean off levophed.  Able to maintain MAP within ordered parameters, but SBP<90, so unable to maintain wean.  Pt remains at 40mcg/min.

## 2017-04-27 NOTE — Progress Notes (Signed)
  End of hd 

## 2017-04-27 NOTE — Progress Notes (Addendum)
Patient ID: [name], female   DOB: 06-Mar-1951, 66 y.o.   MRN: [id number]     [employer] PROGRESS NOTE  [name] QPR:[id number] DOB: 04/05/1951 DOA: 04/08/2017 PCP: Tracie Harrier, MD  HPI/Subjective: alert ,awake, oriented, off levaphed drip,eatign little better.  Objective: Vitals:   04/27/17 1100 04/27/17 1200  BP: (!) 84/56 (!) 85/57  Pulse: 85 97  Resp: (!) 25 17  Temp: 97.7 F (36.5 C)     Filed Weights   04/25/17 1000 04/26/17 0900 04/26/17 1530  Weight: 80.3 kg (177 lb 0.5 oz) 79.3 kg (174 lb 13.2 oz) 78.9 kg (174 lb)    ROS: Review of Systems  Constitutional: Positive for malaise/fatigue. Negative for chills and fever.  HENT: Negative for hearing loss.   Eyes: Negative for blurred vision, double vision and photophobia.  Respiratory: Negative for cough, hemoptysis and shortness of breath.   Cardiovascular: Positive for leg swelling. Negative for chest pain, palpitations and orthopnea.  Gastrointestinal: Negative for abdominal pain, diarrhea and vomiting.  Genitourinary: Negative for dysuria and urgency.  Musculoskeletal: Negative for myalgias and neck pain.  Skin: Negative for rash.  Neurological: Positive for weakness. Negative for dizziness, focal weakness, seizures and headaches.  Psychiatric/Behavioral: Negative for memory loss. The patient does not have insomnia.     Exam: Physical Exam  HENT:  Head: Atraumatic.  Nose: No mucosal edema.  Mouth/Throat: No oropharyngeal exudate or posterior oropharyngeal edema.  Eyes: Pupils are equal, round, and reactive to light. Lids are normal.  Icteric sclera  Neck: Carotid bruit is not present. No thyromegaly present.  Cardiovascular: Regular rhythm, S1 normal, S2 normal and normal heart sounds.   Respiratory: She has no decreased breath sounds. She has no wheezes. She has no rales.  GI: Soft. Bowel sounds are normal. There is no tenderness.  Musculoskeletal:       Right ankle: She exhibits swelling.        Left ankle: She exhibits swelling.  Lymphadenopathy:    She has no cervical adenopathy.  Neurological:  Able to wiggle toes bilateral feet. Able to squeeze hands.  Skin: Skin is warm. No cyanosis.  Left lower extremity covered today Right foot with some blisters that are healing Bruising right neck and left thigh  Psychiatric: Her affect is blunt.  Skin infection with cellulitis of left ankle.    Data Reviewed: Basic Metabolic Panel:  Recent Labs Lab 04/24/17 1241 04/24/17 1452 04/24/17 2215 04/25/17 0441 04/25/17 1730 04/25/17 2335 04/26/17 0522 04/27/17 0415  NA 134*  --  135 138 140  --  138 138  K 4.7  --  4.2 4.4 4.0  --  4.3 4.7  CL 100*  --  100* 101 100*  --  100* 100*  CO2 26  --  27 26 29   --  27 25  GLUCOSE 108*  --  112* 98 110*  --  129* 77  BUN 56*  --  51* 48* 24*  --  50* 76*  CREATININE 1.02*  --  0.96 1.03* 0.55  --  1.05* 1.96*  CALCIUM 8.4*  --  8.4* 8.7* 8.6*  --  8.2* 7.7*  MG 10.0* 3.0* 2.0 1.9 1.8  --   --   --   PHOS 2.4*  --  2.7 3.3 2.8 3.8  --   --    Liver Function Tests: CBC:  Recent Labs Lab 04/22/17 0429 04/23/17 0437 04/25/17 0441 04/26/17 0742 04/27/17 0415  WBC 10.8 9.9 8.6 10.0 8.4  NEUTROABS  --   --   --  7.8* 5.9  HGB 7.6* 7.4* 6.8* 8.6* 8.3*  HCT 23.0* 22.6* 20.6* 25.6* 24.6*  MCV 97.9 97.4 98.4 95.9 96.4  PLT 59* 73* 66* 103* 104*   BNP (last 3 results)  Recent Labs  04/08/17 0139  BNP 2,012.0*     CBG:  Recent Labs Lab 04/26/17 0755 04/26/17 1650 04/27/17 0005 04/27/17 0814 04/27/17 1258  GLUCAP 131* 100* 121* 75 77    Recent Results (from the past 240 hour(s))  Culture, blood (Routine X 2) w Reflex to ID Panel     Status: None (Preliminary result)   Collection Time: 04/26/17  7:30 AM  Result Value Ref Range Status   Specimen Description BLOOD PICC LINE  Final   Special Requests   Final    BOTTLES DRAWN AEROBIC AND ANAEROBIC Blood Culture adequate volume   Culture NO GROWTH 1 DAY  Final    Report Status PENDING  Incomplete  Culture, blood (Routine X 2) w Reflex to ID Panel     Status: None (Preliminary result)   Collection Time: 04/26/17  7:30 AM  Result Value Ref Range Status   Specimen Description BLOOD RIGHT HAND  Final   Special Requests   Final    BOTTLES DRAWN AEROBIC AND ANAEROBIC Blood Culture adequate volume   Culture NO GROWTH 1 DAY  Final   Report Status PENDING  Incomplete     Studies: No results found.  Scheduled Meds: . amiodarone  200 mg Per Tube BID  . carvedilol  3.125 mg Per Tube BID WC  . collagenase   Topical Daily  . famotidine  20 mg Per Tube QHS  . feeding supplement  1 Container Oral TID BM  . levothyroxine  100 mcg Per Tube QAC breakfast  . mouth rinse  15 mL Mouth Rinse BID  . midodrine  10 mg Oral TID WC  . multivitamin  1 tablet Oral QHS  . potassium & sodium phosphates  2 packet Oral TID  . QUEtiapine  25 mg Per Tube QHS  . silver sulfADIAZINE   Topical BID   Continuous Infusions: . albumin human    . norepinephrine (LEVOPHED) Adult infusion Stopped (04/27/17 0735)    Assessment/Plan:  1. Acute delirium.  Improved 2. Septic shock with multiorgan failure. Overall prognosis is poor. Patient currently a DO NOT RESUSCITATE. clinically better.LFT better.stopped levaphed   3. PEA Cardiopulmonary arrest. For 2 min s/p intubation and extubation.  Shock liver from cardiopulmonary arrest. Liver function trending better.  4. Acute . Renal failure with proteinuria, hematuria: Baseline creatinine 0.62. Received CRRT ,Started on hemodialysis.received permacath yesterday  5. Acute respiratory failure status post extubation and on nasal cannula. continue O2 [place], NEB. 6. Hypomagnesemia, hypophosphatemia and hypokalemia. On electrolyte protocol 7. Atrial fibrillation with RVR,  continue amiodarone and Lopressor IV when necessary. Pradaxa on hold.  8.  9. Coagulopathy. Could be secondary to shock liver.better 10. Diarrhea. resolved    11. Cardiomyopathy on echocardiogram. 12. Anemia and thrombocytopenia. Continue to monitor closely  13. Hypothyroidism unspecified on levothyroxine 14. Depression on Zoloft 15. Nutritional status. Removed.ng tube., started feeding, 16. LATCH referal in progress. Code Status:     Code Status Orders        Start     Ordered   04/08/17 0651  Full code  Continuous     04/08/17 0650    Code Status History    Date Active Date Inactive Code Status Order  ID Comments User Context   This patient has a current code status but no historical code status.     Family Communication: Spoke with Husband at bedside Disposition Plan: To be determined based on clinical course  Consultants:  Critical care specialist  Infectious disease  Nephrology  Palliative care  Cardiology  Time spent: 25 minutes.   [employer]

## 2017-04-27 NOTE — Progress Notes (Signed)
   Hd start, pt alert, no c/o, stable 

## 2017-04-27 NOTE — Progress Notes (Signed)
PULMONARY / CRITICAL CARE MEDICINE   Name: [name] MRN: [id number] DOB: April 26, 1951    ADMISSION DATE:  04/08/2017  PT PROFILE:   66 y.o. F with PMH of CAF, hypothyroidism admitted via ED to ICU with many days of progressive LE edema, blistering lesions on BLE, progressive weakness. Adm diagnosis of septic shock, cellulitis, AKI.  Pt PEA arrested 07/4 ACLS protocol initiated ROSC within 2 minutes following interventions intubated during cardiac arrest and extubated 07/9.   MAJOR EVENTS/TEST RESULTS: 07/01 LE venous US: no DVT 07/01 Echocardiogram:  LVEF 25-30% 07/01 Nephrology consultation 07/02 ID consultation 07/02 CRRT initiated 07/03 Off vasopressors. Worsening tachycardia - amiodarone initiated 07/04 Pt PEA arrest ACLS protocol initiated ROSC 2 minutes now back on vasopressors  07/05 Intubated, shock req high dose vasopressors, on CRRT, elevated LFTs c/w congestion/shock liver, pulmonary edema vs ARDS on CXR 07/09 Pt extubated  07/11 remains on CRRT and vasopressors, resp status stable 07/12 remains on CRRT, on amiodarone. Dr Mortimer Fries addressed goals of care with pt's husband. Made DNR/DNI, continue current management without significant escalation. NGT placed for nutrition 07/13 off vasopressors, cont on CRRT with increased UF to 200/hr.  07/14 PCCM/Nephrology agree to continue CRRT through [facility]. 07/15 Increased confusion and intermittent agitation. Quetiapine initiated 07/16 Remains on CRRT, ID signed off  07/18 CRRT stopped pt transitioned to HD  07/19 Dobhoff discontinued and diet advanced  07/19 PermCath placement by vascular surgery  INDWELLING DEVICES:: R fem HD cath 07/02 >>out  ETT 07/04 >>07/09 Left IJ 07/04 >>  R IJ HD cath 07/12 >> removed PermCath 7/19  MICRO DATA: MRSA PCR 07/01 >> NEG Urine 07/01 >> NEG Blood 07/01 >> NEG C diff 07/01 >> NEG GI panel 07/02 >> NEG Resp 07/05 >> moderate candida tropicalis  Blood 07/08 >> NEG Blood 07/09 >> NEG Resp 07/08 >>  abundant candida tropicalis Cdiff 07/09 >> NEG   ANTIMICROBIALS:  Vanc 07/01 >> 07/02 Pip-tazo 07/01 >> 7/10 Vancomycin 07/6 >>7/10  SUBJECTIVE:  No acute issues overnight  Remains lethargic but arousable Patient on low dose levophed   VITAL SIGNS: BP (!) 83/71   Pulse 93   Temp (!) 97.3 F (36.3 C)   Resp (!) 9   Ht 5\' 5"  (1.651 m)   Wt 174 lb (78.9 kg)   SpO2 96%   BMI 28.96 kg/m   VENTILATOR SETTINGS:    INTAKE / OUTPUT: I/O last 3 completed shifts: In: 1537.7 [P.O.:508; I.V.:184.7; NG/GT:845] Out: 0  PHYSICAL EXAMINATION: General: NAD Neuro: RASS 0, no focal deficits HEENT: NCAT, sclericterus Cardiovascular: IRIR, no murmurs noted Lungs: Clear anteriorly, even, non labored; no wheezes, rales, rhonchi, or crackles  Abdomen: obese, soft, ND, NT  Ext: improved BUE and BLE edema   LABS:  BMET  Recent Labs Lab 04/25/17 1730 04/26/17 0522 04/27/17 0415  NA 140 138 138  K 4.0 4.3 4.7  CL 100* 100* 100*  CO2 29 27 25   BUN 24* 50* 76*  CREATININE 0.55 1.05* 1.96*  GLUCOSE 110* 129* 77    Electrolytes  Recent Labs Lab 04/24/17 2215 04/25/17 0441 04/25/17 1730 04/25/17 2335 04/26/17 0522 04/27/17 0415  CALCIUM 8.4* 8.7* 8.6*  --  8.2* 7.7*  MG 2.0 1.9 1.8  --   --   --   PHOS 2.7 3.3 2.8 3.8  --   --     CBC  Recent Labs Lab 04/25/17 0441 04/26/17 0742 04/27/17 0415  WBC 8.6 10.0 8.4  HGB 6.8* 8.6* 8.3*  HCT 20.6* 25.6* 24.6*  PLT 66* 103* 104*    Coag's  Recent Labs Lab 04/25/17 0441 04/26/17 0522 04/27/17 0415  APTT 26 33 33    Sepsis Markers No results for input(s): LATICACIDVEN, PROCALCITON, O2SATVEN in the last 168 hours.  ABG No results for input(s): PHART, PCO2ART, PO2ART in the last 168 hours.  Liver Enzymes  Recent Labs Lab 04/21/17 0500  04/23/17 0437  04/24/17 2215 04/25/17 0441 04/25/17 1730  AST 67*  --  83*  --   --  66*  --   ALT 190*  --  139*  --   --  93*  --   ALKPHOS 462*  --  421*  --    --  322*  --   BILITOT 4.0*  --  3.2*  --   --  2.9*  --   ALBUMIN 3.5  < > 4.0  4.0  < > 3.8 4.1  4.1 4.3  < > = values in this interval not displayed.  Cardiac Enzymes No results for input(s): TROPONINI, PROBNP in the last 168 hours.  Glucose  Recent Labs Lab 04/25/17 1612 04/25/17 2356 04/26/17 0755 04/26/17 1650 04/27/17 0005 04/27/17 0814  GLUCAP 106* 137* 131* 100* 121* 75    CXR: NNF  ASSESSMENT / PLAN: Acute hypoxic respiratory failure-slowly resolving Pulmonary edema Severe dilated cardiomyopathy Chronic AF Status post PEA arrest 07/04  Cardiogenic shock, resolved Anuric AKI - likely cardiorenal plus ATN Anasarca, improving Protein-calorie malnutrition Elevated LFTs due to shock liver, hepatic congestion - improving Diarrhea, resolved Anemia without evidence of acute blood loss Thrombocytopenia-improving  Elevated prothrombin time, resolved LE cellulitis - fully treated Severe deconditioning  Plan: Continue supplemental oxygen to maintain O2 sats >92% HD per Nephrology recommendations  Prn levophed to maintain map goal >60 Increased midodrine dose and frequency-07/19 Continue amiodarone, synthroid, and carvedilol  Monitor BMET intermittently Monitor I/Os Correct electrolytes as indicated DVT px: SCDs Monitor CBC intermittently Transfuse per usual guidelines Discontinue tube feeds advance diet  Continue physical therapy-goal OOB to Chair  DNR/DNI  Vascular surgery  permcath placement     IOEVOJ [name], M.D.  [facility]  Medical Director [place] Director [place] Department

## 2017-04-27 NOTE — Progress Notes (Signed)
Post hd vitals 

## 2017-04-27 NOTE — Evaluation (Signed)
Physical Therapy Evaluation Patient Details Name: [name] MRN: [id number] DOB: Sep 05, 1951 Today's Date: 04/27/2017   History of Present Illness  presented to ER secondary to LE swelling, difficulty walking; admitted with sepsis due to LE cellulitis.  Patient hospital course complicated by PEA arrest requriing ACLS with subsequent intubation (7/4-7/9), CRRT 7/2-7/18 with transition to HD (R IJ perm cath).  Clinical Impression  Upon evaluation, patient alert and oriented to basic information; mild difficulty with higher-level cognitive tasks.  Bilat UE/LE profoundly weak due to acute illness; requiring extensive assist of 1-2 people for all functional activities at this time.  Does demonstrate ability to maintain static sitting balance with close sup, but maintains forward flexed/sacral sitting posture with limited balance reactions evident.  Appropriate chronotropic response to exercise and transition to upright; no reports of dizziness, chest pain or orthostasis.  Will plan to attempt OOB to chair (anticipate need for scoot/squat pivot) next session as medically appropriate (limited this date by bowel incontinence). Would benefit from skilled PT to address above deficits and promote optimal return to PLOF; recommend transition to [facility] upon discharge from acute hospitalization.     Follow Up Recommendations LTACH (pursuing LTAC per care team)    Equipment Recommendations       Recommendations for Other Services       Precautions / Restrictions Precautions Precautions: Fall Precaution Comments: R IJ perm cath, L IJ central line Restrictions Weight Bearing Restrictions: No      Mobility  Bed Mobility Overal bed mobility: Needs Assistance Bed Mobility: Supine to Sit;Sit to Supine     Supine to sit: Mod assist;Max assist Sit to supine: Max assist;Total assist   General bed mobility comments: extensive assist for LE management, truncal elevation; very limited ability to actively  assist with bilat UEs due to profound weaknes  Transfers                 General transfer comment: unable to attempt due to inconinent bowel episode with transition to upright  Ambulation/Gait             General Gait Details: unsafe/unable  Stairs            Wheelchair Mobility    Modified Rankin (Stroke Patients Only)       Balance Overall balance assessment: Needs assistance Sitting-balance support: No upper extremity supported;Feet supported Sitting balance-Leahy Scale: Fair Sitting balance - Comments: forward flexed, sacral sitting position; delayed balance reactions                                     Pertinent Vitals/Pain Pain Assessment: No/denies pain    Home Living Family/patient expects to be discharged to:: Private residence Living Arrangements: Spouse/significant other Available Help at Discharge: Family Type of Home: House Home Access: Stairs to enter Entrance Stairs-Rails: Right Entrance Stairs-Number of Steps: 4 Home Layout: One level Home Equipment: None      Prior Function Level of Independence: Independent         Comments: Indep with ADLs, household and community mobility; + driving; denies fall history.     Hand Dominance        Extremity/Trunk Assessment   Upper Extremity Assessment Upper Extremity Assessment: Generalized weakness (bilat UEs grossly 3-/5, weaker proximally > distally)    Lower Extremity Assessment Lower Extremity Assessment: Generalized weakness (grossly 3-/5 throughout, bilat ankles to neutral; denies sensory deficit)  Communication   Communication: No difficulties  Cognition Arousal/Alertness: Awake/alert Behavior During Therapy: WFL for tasks assessed/performed Overall Cognitive Status: Within Functional Limits for tasks assessed                                 General Comments: mild difficulty with higher-level cognitive tasks, recall of new  information and processing of complex information      General Comments      Exercises Other Exercises Other Exercises: Supine LE therex, 1x10, AROM: ankle pumps, hip abduct/adduct, heel slides with resisted extension Other Exercises: Unsupported sitting, bilat UEs propped on bedside table anterior to patient, worked on cervical, thoracic and lumbar extension.  Limited ROM evident.   Assessment/Plan    PT Assessment Patient needs continued PT services  PT Problem List Decreased strength;Decreased range of motion;Decreased activity tolerance;Decreased balance;Decreased mobility;Decreased coordination;Decreased knowledge of use of DME;Decreased safety awareness;Decreased knowledge of precautions;Cardiopulmonary status limiting activity;Decreased skin integrity       PT Treatment Interventions DME instruction;Gait training;Stair training;Functional mobility training;Therapeutic activities;Therapeutic exercise;Balance training;Neuromuscular re-education;Cognitive remediation;Patient/family education    PT Goals (Current goals can be found in the Care Plan section)  Acute Rehab PT Goals Patient Stated Goal: to get stronger PT Goal Formulation: With patient Time For Goal Achievement: 05/11/17 Potential to Achieve Goals: Good Additional Goals Additional Goal #1: Assess and establish goals for OOB activities as medically appropriate.    Frequency Min 2X/week   Barriers to discharge        Co-evaluation               AM-PAC PT "6 Clicks" Daily Activity  Outcome Measure Difficulty turning over in bed (including adjusting bedclothes, sheets and blankets)?: Total Difficulty moving from lying on back to sitting on the side of the bed? : Total Difficulty sitting down on and standing up from a chair with arms (e.g., wheelchair, bedside commode, etc,.)?: Total Help needed moving to and from a bed to chair (including a wheelchair)?: Total Help needed walking in hospital room?:  Total Help needed climbing 3-5 steps with a railing? : Total 6 Click Score: 6    End of Session   Activity Tolerance: Patient tolerated treatment well Patient left: in bed;with call bell/phone within reach Nurse Communication: Mobility status PT Visit Diagnosis: Muscle weakness (generalized) (M62.81);Difficulty in walking, not elsewhere classified (R26.2)    Time: 1657-9038 PT Time Calculation (min) (ACUTE ONLY): 28 min   Charges:   PT Evaluation $PT Eval Moderate Complexity: 1 Procedure PT Treatments $Therapeutic Exercise: 8-22 mins $Therapeutic Activity: 8-22 mins   PT G Codes:   PT G-Codes **NOT FOR INPATIENT CLASS** Functional Assessment Tool Used: AM-PAC 6 Clicks Basic Mobility Functional Limitation: Mobility: Walking and moving around Mobility: Walking and Moving Around Current Status (B3383): 100 percent impaired, limited or restricted Mobility: Walking and Moving Around Goal Status (A9191): At least 1 percent but less than 20 percent impaired, limited or restricted    Jaeshaun Riva H. Owens Shark, PT, DPT, NCS 04/27/17, 9:12 PM [phone]

## 2017-04-27 NOTE — Progress Notes (Signed)
Pre hd info 

## 2017-04-27 NOTE — Progress Notes (Signed)
Post hd assessment 

## 2017-04-28 LAB — GLUCOSE, CAPILLARY
Glucose-Capillary: 64 mg/dL — ABNORMAL LOW (ref 65–99)
Glucose-Capillary: 92 mg/dL (ref 65–99)
Glucose-Capillary: 82 mg/dL (ref 65–99)
Glucose-Capillary: 75 mg/dL (ref 65–99)
Glucose-Capillary: 91 mg/dL (ref 65–99)
Glucose-Capillary: 123 mg/dL — ABNORMAL HIGH (ref 65–99)

## 2017-04-28 LAB — APTT: aPTT: 35 seconds (ref 24–36)

## 2017-04-28 LAB — CBC
HCT: 24.8 % — ABNORMAL LOW (ref 35.0–47.0)
HEMOGLOBIN: 8.4 g/dL — AB (ref 12.0–16.0)
MCH: 33.3 pg (ref 26.0–34.0)
MCHC: 34.1 g/dL (ref 32.0–36.0)
MCV: 97.8 fL (ref 80.0–100.0)
PLATELETS: 120 10*3/uL — AB (ref 150–440)
RBC: 2.53 MIL/uL — ABNORMAL LOW (ref 3.80–5.20)
RDW: 21.6 % — ABNORMAL HIGH (ref 11.5–14.5)
WBC: 8.4 10*3/uL (ref 3.6–11.0)

## 2017-04-28 LAB — COMPREHENSIVE METABOLIC PANEL
ALK PHOS: 235 U/L — AB (ref 38–126)
ALT: 59 U/L — ABNORMAL HIGH (ref 14–54)
ANION GAP: 12 (ref 5–15)
AST: 51 U/L — ABNORMAL HIGH (ref 15–41)
Albumin: 3.4 g/dL — ABNORMAL LOW (ref 3.5–5.0)
BUN: 38 mg/dL — ABNORMAL HIGH (ref 6–20)
CALCIUM: 7.8 mg/dL — AB (ref 8.9–10.3)
CO2: 30 mmol/L (ref 22–32)
CREATININE: 1.41 mg/dL — AB (ref 0.44–1.00)
Chloride: 96 mmol/L — ABNORMAL LOW (ref 101–111)
GFR, EST AFRICAN AMERICAN: 44 mL/min — AB (ref >60)
GFR, EST NON AFRICAN AMERICAN: 38 mL/min — AB (ref >60)
Glucose, Bld: 79 mg/dL (ref 65–99)
Potassium: 3.5 mmol/L (ref 3.5–5.1)
SODIUM: 138 mmol/L (ref 135–145)
Total Bilirubin: 2.4 mg/dL — ABNORMAL HIGH (ref 0.3–1.2)
Total Protein: 5.8 g/dL — ABNORMAL LOW (ref 6.5–8.1)

## 2017-04-28 MED ORDER — DEXTROSE 10 % IV SOLN
INTRAVENOUS | Status: DC
  Administered 2017-04-29: 1000 mL via INTRAVENOUS

## 2017-04-28 NOTE — Progress Notes (Signed)
  Speech Language Pathology Treatment: Dysphagia  Patient Details Name: [name] MRN: [id number] DOB: 1951/07/14 Today's Date: 04/28/2017 Time: 5790-3833 SLP Time Calculation (min) (ACUTE ONLY): 20 min  Assessment / Plan / Recommendation Clinical Impression  Pt seen today to assess diet toleration after her Dobhoff was removed on 7/19. Pt, family, and nsg report pt has been tolerating a regular diet well, but continues to have a limited appetite. Pt demonstrated no overt s/s of aspiration w/thin and solid consistencies. Vocal quality remained clear and O2 and RR remained stable. No oral phase deficits were observed. Pt able to masticate and clear solid well. No pocketing or holding was observed. Recommend continue w/a regular diet w/thin liquids. Continue to encourage PO intake. Will f/u in 1-2 days.   HPI HPI: Pt 66 y.o. F with PMH of CAF, hypothyroidism admitted via ED to ICU with many days of progressive LE edema, blistering lesions on BLE, progressive weakness. Adm diagnosis of septic shock, cellulitis, AKI.  Pt PEA arrested 07/4 ACLS protocol initiated ROSC within 2 minutes following interventions intubated during cardiac arrest and extubated 07/9. Pt is much more alert and talkative. Pt's Dobhoff was removed on 7/19 and since then has been eating a regular diet, but continues with decreased appetite.  No reports of s/s of aspiration have been noted by Husband and [facility] staff.       SLP Plan  Continue with current plan of care       Recommendations  Diet recommendations: Regular;Thin liquid Liquids provided via: Cup;Straw Medication Administration: Whole meds with puree Supervision: Patient able to self feed Compensations: Minimize environmental distractions;Slow rate;Small sips/bites Postural Changes and/or Swallow Maneuvers: Seated upright 90 degrees;Upright 30-60 min after meal                Oral Care Recommendations: Oral care BID;Staff/trained caregiver to provide oral  care Follow up Recommendations: LTACH SLP Visit Diagnosis: Dysphagia, unspecified (R13.10) Plan: Continue with current plan of care       GO                [place] 04/28/2017, 2:15 PM

## 2017-04-28 NOTE — Progress Notes (Signed)
Per Dr Mortimer Fries continue to attempt to wean levophed as long as patient in asymptomatic.  SBP in 70s at this time. MAP 67.

## 2017-04-28 NOTE — Progress Notes (Signed)
OT Cancellation Note  Patient Details Name: [name] MRN: [id number] DOB: 01/30/1951   Cancelled Treatment:    Reason Eval/Treat Not Completed: Medical issues which prohibited therapy. Order received, chart reviewed. Pt with BP 87/59 this morning. Will hold OT evaluation for now and re-attempt later this date pending appropriateness for OT evaluation.  Jeni Salles, MPH, MS, OTR/L ascom [phone] 04/28/17, 8:28 AM

## 2017-04-28 NOTE — Progress Notes (Signed)
[facility]  ROUNDING NOTE   Subjective:  Patient had hemodialysis yesterday. She tolerated this quite well. Creatinine down to 1.4. Pressors are also being weaned at the moment.  Objective:  Vital signs in last 24 hours:  Temp:  [97.5 F (36.4 C)-98.8 F (37.1 C)] 97.6 F (36.4 C) (07/21 0800) Pulse Rate:  [84-108] 99 (07/21 0800) Resp:  [9-26] 14 (07/21 0800) BP: (62-100)/(31-80) 77/52 (07/21 0800) SpO2:  [91 %-100 %] 91 % (07/21 0600) Weight:  [78.9 kg (173 lb 15.1 oz)] 78.9 kg (173 lb 15.1 oz) (07/21 0412)  Weight change: -0.4 kg (-14.1 oz) Filed Weights   04/26/17 0900 04/26/17 1530 04/28/17 0412  Weight: 79.3 kg (174 lb 13.2 oz) 78.9 kg (174 lb) 78.9 kg (173 lb 15.1 oz)    Intake/Output: I/O last 3 completed shifts: In: 247.1 [P.O.:150; I.V.:97.1] Out: 1500 [Other:1500]   Intake/Output this shift:  Total I/O In: 3.8 [I.V.:3.8] Out: 0   Physical Exam: General: No acute distress   Head: Normocephalic, atraumatic  Eyes: EOM intact  Neck: supple  Lungs:  Basilar rales, normal effort  Heart: Irregular  Abdomen:  Soft, nontender, obese  Extremities: trace bilateral lower extremity edema   Neurologic: Awake, alert, following commands   Skin: Warm, dry  Access: Right IJ temp cath 7/12 [facility])    Basic Metabolic Panel:  Recent Labs Lab 04/24/17 1241 04/24/17 1452 04/24/17 2215 04/25/17 0441 04/25/17 1730 04/25/17 2335 04/26/17 0522 04/27/17 0415 04/27/17 1730 04/28/17 0507  NA 134*  --  135 138 140  --  138 138  --  138  K 4.7  --  4.2 4.4 4.0  --  4.3 4.7  --  3.5  CL 100*  --  100* 101 100*  --  100* 100*  --  96*  CO2 26  --  27 26 29   --  27 25  --  30  GLUCOSE 108*  --  112* 98 110*  --  129* 77  --  79  BUN 56*  --  51* 48* 24*  --  50* 76*  --  38*  CREATININE 1.02*  --  0.96 1.03* 0.55  --  1.05* 1.96*  --  1.41*  CALCIUM 8.4*  --  8.4* 8.7* 8.6*  --  8.2* 7.7*  --  7.8*  MG 10.0* 3.0* 2.0 1.9 1.8  --   --   --   --    --   PHOS 2.4*  --  2.7 3.3 2.8 3.8  --  7.4* 4.5  --     Liver Function Tests:  Recent Labs Lab 04/23/17 0437  04/24/17 1241 04/24/17 2215 04/25/17 0441 04/25/17 1730 04/28/17 0507  AST 83*  --   --   --  66*  --  51*  ALT 139*  --   --   --  93*  --  59*  ALKPHOS 421*  --   --   --  322*  --  235*  BILITOT 3.2*  --   --   --  2.9*  --  2.4*  PROT 6.5  --   --   --  6.3*  --  5.8*  ALBUMIN 4.0  4.0  < > 3.8 3.8 4.1  4.1 4.3 3.4*  < > = values in this interval not displayed. No results for input(s): LIPASE, AMYLASE in the last 168 hours. No results for input(s): AMMONIA in the last 168 hours.  CBC:  Recent  Labs Lab 04/23/17 0437 04/25/17 0441 04/26/17 0742 04/27/17 0415 04/28/17 0507  WBC 9.9 8.6 10.0 8.4 8.4  NEUTROABS  --   --  7.8* 5.9  --   HGB 7.4* 6.8* 8.6* 8.3* 8.4*  HCT 22.6* 20.6* 25.6* 24.6* 24.8*  MCV 97.4 98.4 95.9 96.4 97.8  PLT 73* 66* 103* 104* 120*    Cardiac Enzymes: No results for input(s): CKTOTAL, CKMB, CKMBINDEX, TROPONINI in the last 168 hours.  BNP: Invalid input(s): POCBNP  CBG:  Recent Labs Lab 04/28/17 0211 04/28/17 0237 04/28/17 0352 04/28/17 0527 04/28/17 0736  GLUCAP 64* 92 82 75 91    Microbiology: Results for orders placed or performed during the hospital encounter of 04/08/17  Culture, blood (routine x 2)     Status: None   Collection Time: 04/08/17  2:04 AM  Result Value Ref Range Status   Specimen Description BLOOD RIGHT ANTECUBITAL  Final   Special Requests   Final    BOTTLES DRAWN AEROBIC AND ANAEROBIC Blood Culture adequate volume   Culture NO GROWTH 5 DAYS  Final   Report Status 04/13/2017 FINAL  Final  Culture, blood (routine x 2)     Status: None   Collection Time: 04/08/17  2:06 AM  Result Value Ref Range Status   Specimen Description BLOOD LEFT ANTECUBITAL  Final   Special Requests   Final    BOTTLES DRAWN AEROBIC AND ANAEROBIC Blood Culture adequate volume   Culture NO GROWTH 5 DAYS  Final    Report Status 04/13/2017 FINAL  Final  C difficile quick scan w PCR reflex     Status: None   Collection Time: 04/08/17  5:09 AM  Result Value Ref Range Status   C Diff antigen NEGATIVE NEGATIVE Final   C Diff toxin NEGATIVE NEGATIVE Final   C Diff interpretation No C. difficile detected.  Final  MRSA PCR Screening     Status: None   Collection Time: 04/08/17  6:50 AM  Result Value Ref Range Status   MRSA by PCR NEGATIVE NEGATIVE Final    Comment:        The GeneXpert MRSA Assay (FDA approved for NASAL specimens only), is one component of a comprehensive MRSA colonization surveillance program. It is not intended to diagnose MRSA infection nor to guide or monitor treatment for MRSA infections.   Urine culture     Status: None   Collection Time: 04/08/17 12:16 PM  Result Value Ref Range Status   Specimen Description URINE, RANDOM  Final   Special Requests NONE  Final   Culture   Final    NO GROWTH Performed at [facility], [address], [place], [place] [address]    Report Status 04/10/2017 FINAL  Final  Aerobic Culture (superficial specimen)     Status: None   Collection Time: 04/09/17 11:32 AM  Result Value Ref Range Status   Specimen Description SKIN  Final   Special Requests NONE  Final   Gram Stain   Final    RARE WBC PRESENT, PREDOMINANTLY PMN NO SQUAMOUS EPITHELIAL CELLS SEEN NO ORGANISMS SEEN    Culture   Final    NO GROWTH 2 DAYS Performed at [facility], [place] [address] [place], [place] [address]    Report Status 04/11/2017 FINAL  Final  Gastrointestinal Panel by PCR , Stool     Status: None   Collection Time: 04/09/17  5:10 PM  Result Value Ref Range Status   Campylobacter species NOT  DETECTED NOT DETECTED Final   Plesimonas shigelloides NOT DETECTED NOT DETECTED Final   Salmonella species NOT DETECTED NOT DETECTED Final   Yersinia enterocolitica NOT DETECTED NOT DETECTED Final   Vibrio species NOT DETECTED NOT DETECTED Final   Vibrio  cholerae NOT DETECTED NOT DETECTED Final   Enteroaggregative E coli (EAEC) NOT DETECTED NOT DETECTED Final   Enteropathogenic E coli (EPEC) NOT DETECTED NOT DETECTED Final   Enterotoxigenic E coli (ETEC) NOT DETECTED NOT DETECTED Final   Shiga like toxin producing E coli (STEC) NOT DETECTED NOT DETECTED Final   Shigella/Enteroinvasive E coli (EIEC) NOT DETECTED NOT DETECTED Final   Cryptosporidium NOT DETECTED NOT DETECTED Final   Cyclospora cayetanensis NOT DETECTED NOT DETECTED Final   Entamoeba histolytica NOT DETECTED NOT DETECTED Final   Giardia lamblia NOT DETECTED NOT DETECTED Final   Adenovirus F40/41 NOT DETECTED NOT DETECTED Final   Astrovirus NOT DETECTED NOT DETECTED Final   Norovirus GI/GII NOT DETECTED NOT DETECTED Final   Rotavirus A NOT DETECTED NOT DETECTED Final   Sapovirus (I, II, IV, and V) NOT DETECTED NOT DETECTED Final  Culture, respiratory (NON-Expectorated)     Status: None   Collection Time: 04/12/17 12:00 PM  Result Value Ref Range Status   Specimen Description TRACHEAL ASPIRATE  Final   Special Requests NONE  Final   Gram Stain   Final    MODERATE WBC PRESENT,BOTH PMN AND MONONUCLEAR MODERATE YEAST Performed at [facility], [address] [place], [place] [address]    Culture MODERATE CANDIDA TROPICALIS  Final   Report Status 04/14/2017 FINAL  Final  Culture, respiratory (NON-Expectorated)     Status: None   Collection Time: 04/15/17  7:48 AM  Result Value Ref Range Status   Specimen Description TRACHEAL ASPIRATE  Final   Special Requests Normal  Final   Gram Stain   Final    RARE WBC PRESENT, PREDOMINANTLY PMN FEW BUDDING YEAST SEEN Performed at [facility], [place] [address], [place] [address], [place] [address]    Culture ABUNDANT CANDIDA TROPICALIS  Final   Report Status 04/18/2017 FINAL  Final  CULTURE, BLOOD (ROUTINE X 2) w Reflex to ID Panel     Status: None   Collection Time: 04/15/17 10:46 AM  Result Value Ref Range Status   Specimen  Description BLOOD LEFT SUBCLAVIAN TRIPLE LUMEN  Final   Special Requests   Final    BOTTLES DRAWN AEROBIC AND ANAEROBIC Blood Culture results may not be optimal due to an excessive volume of blood received in culture bottles   Culture NO GROWTH 5 DAYS  Final   Report Status 04/20/2017 FINAL  Final  CULTURE, BLOOD (ROUTINE X 2) w Reflex to ID Panel     Status: None   Collection Time: 04/16/17  1:21 PM  Result Value Ref Range Status   Specimen Description BLOOD BLOOD LEFT HAND  Final   Special Requests   Final    BOTTLES DRAWN AEROBIC AND ANAEROBIC Blood Culture results may not be optimal due to an inadequate volume of blood received in culture bottles   Culture NO GROWTH 5 DAYS  Final   Report Status 04/21/2017 FINAL  Final  CULTURE, BLOOD (ROUTINE X 2) w Reflex to ID Panel     Status: None   Collection Time: 04/16/17  1:27 PM  Result Value Ref Range Status   Specimen Description BLOOD A-LINE DRAW  Final   Special Requests   Final    BOTTLES DRAWN  AEROBIC AND ANAEROBIC Blood Culture adequate volume   Culture NO GROWTH 5 DAYS  Final   Report Status 04/21/2017 FINAL  Final  C difficile quick scan w PCR reflex     Status: None   Collection Time: 04/16/17 10:15 PM  Result Value Ref Range Status   C Diff antigen NEGATIVE NEGATIVE Final   C Diff toxin NEGATIVE NEGATIVE Final   C Diff interpretation No C. difficile detected.  Final    Comment: VALID  Culture, blood (Routine X 2) w Reflex to ID Panel     Status: None (Preliminary result)   Collection Time: 04/26/17  7:30 AM  Result Value Ref Range Status   Specimen Description BLOOD PICC LINE  Final   Special Requests   Final    BOTTLES DRAWN AEROBIC AND ANAEROBIC Blood Culture adequate volume   Culture NO GROWTH 2 DAYS  Final   Report Status PENDING  Incomplete  Culture, blood (Routine X 2) w Reflex to ID Panel     Status: None (Preliminary result)   Collection Time: 04/26/17  7:30 AM  Result Value Ref Range Status   Specimen  Description BLOOD RIGHT HAND  Final   Special Requests   Final    BOTTLES DRAWN AEROBIC AND ANAEROBIC Blood Culture adequate volume   Culture NO GROWTH 2 DAYS  Final   Report Status PENDING  Incomplete    Coagulation Studies: No results for input(s): LABPROT, INR in the last 72 hours.  Urinalysis: No results for input(s): COLORURINE, LABSPEC, PHURINE, GLUCOSEU, HGBUR, BILIRUBINUR, KETONESUR, PROTEINUR, UROBILINOGEN, NITRITE, LEUKOCYTESUR in the last 72 hours.  Invalid input(s): APPERANCEUR    Imaging: No results found.   Medications:   . albumin human    . dextrose    . norepinephrine (LEVOPHED) Adult infusion 6 mcg/min (04/28/17 0820)   . amiodarone  200 mg Per Tube BID  . carvedilol  3.125 mg Per Tube BID WC  . collagenase   Topical Daily  . famotidine  20 mg Per Tube QHS  . feeding supplement  1 Container Oral TID BM  . levothyroxine  100 mcg Per Tube QAC breakfast  . midodrine  10 mg Oral TID WC  . multivitamin  1 tablet Oral QHS  . QUEtiapine  25 mg Per Tube QHS  . silver sulfADIAZINE   Topical BID   acetaminophen, artificial tears, heparin, ipratropium-albuterol, LORazepam, metoprolol tartrate, morphine injection, [DISCONTINUED] ondansetron **OR** ondansetron (ZOFRAN) IV, phenol, sodium chloride flush, zinc oxide  Assessment/ Plan:  [name] is a 66 y.o. white female  with hypertension, hypothyroidism, atrial fibrillation, who was admitted to [facility] on 04/08/2017 for evaluation of increasing lower extremity edema.  Hospital course: Admitted on 04/08/2017 with significant peripheral edema and cellulitis. Started on CRRT for hypotension and anuric renal failure on 7/2. Patient weaned off CRRT on 7/4. However later that day, patient had cardiac arrest and coded. Intubated, sedated and restarted on three vasopressors. Restart on CRRT on 7/4.  2-D echo which shows EF 25-30%  1. Acute renal failure with proteinuria and hematuria: baseline creatinine of 0.62 09/2014.   Patient with massive peripheral edema, anasarca and hypoalbuminemia.  Recent IV contrast exposure on 04/04/17.  - Serologic work up: compliments normal, negative SPEP/UPEP, ANA negative, ANCA negative, GBM negative, negative hepatitis - Patient completed hemodialysis yesterday. No acute indication for dialysis today. We will plan for next dialysis treatment on Monday.  2. 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 Echocardiogram with EF of 25-30% - Remains on low-dose pressors however hopefully these can be weaned off today.  3. Acute respiratory failure Extubated 7/9 - Ultrafiltration has assisted in keeping the patient off the ventilator. Continue to monitor respiratory status.  4. 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 - Management of blood sugars as per hospitalist and pulmonary/critical care  5. Generalized edema.  Patient tolerated dialysis as well as ultrafiltration well yesterday. Ultrafiltration was 1.5 kg. Next dialysis on Monday. Continue to use albumin for blood pressure support.   LOS: 20 [name] 7/21/20189:22 AM

## 2017-04-28 NOTE — Progress Notes (Signed)
[place] for Electrolyte Replacement    Pharmacy consulted for electrolyte management for 66 yo female previously requiring CRRT. Patient off CRRT and converted to intermittent hemodialysis.   Plan:  Electrolytes WNL and stable. No replacement needed. Will recheck with AM labs on Monday.  No Known Allergies  Patient Measurements: Height: 5\' 5"  (165.1 cm) Weight: 173 lb 15.1 oz (78.9 kg) IBW/kg (Calculated) : 57 Vital Signs: Temp: 97.6 F (36.4 C) (07/21 0800) Temp Source: Oral (07/21 0800) BP: 101/76 (07/21 0900) Pulse Rate: 98 (07/21 1000) Intake/Output from previous day: 07/20 0701 - 07/21 0700 In: 97.1 [I.V.:97.1] Out: 1500  Intake/Output from this shift: Total I/O In: 131.8 [P.O.:118; I.V.:13.8] Out: 0   Labs:  Recent Labs  04/25/17 1730 04/25/17 2335 04/26/17 0522 04/26/17 0742 04/27/17 0415 04/27/17 1730 04/28/17 0507  WBC  --   --   --  10.0 8.4  --  8.4  HGB  --   --   --  8.6* 8.3*  --  8.4*  HCT  --   --   --  25.6* 24.6*  --  24.8*  PLT  --   --   --  103* 104*  --  120*  APTT  --   --  33  --  33  --  35  CREATININE 0.55  --  1.05*  --  1.96*  --  1.41*  MG 1.8  --   --   --   --   --   --   PHOS 2.8 3.8  --   --  7.4* 4.5  --   ALBUMIN 4.3  --   --   --   --   --  3.4*  PROT  --   --   --   --   --   --  5.8*  AST  --   --   --   --   --   --  51*  ALT  --   --   --   --   --   --  59*  ALKPHOS  --   --   --   --   --   --  235*  BILITOT  --   --   --   --   --   --  2.4*    Lab Results  Component Value Date   K 3.5 04/28/2017    Estimated Creatinine Clearance: 41.3 mL/min (A) (by C-G formula based on SCr of 1.41 mg/dL (H)).   Potassium (mmol/L)  Date Value  04/28/2017 3.5  09/19/2014 4.1   Calcium (mg/dL)  Date Value  04/28/2017 7.8 (L)   Calcium, Total (mg/dL)  Date Value  09/19/2014 8.3 (L)   Sodium (mmol/L)  Date Value  04/28/2017 138  09/19/2014 140    Pharmacy will continue to monitor  and adjust per consult.   Rexene Edison, PharmD, BCPS Clinical Pharmacist   04/28/17 11:31 AM

## 2017-04-28 NOTE — Progress Notes (Signed)
Per Dr Mortimer Fries patient will be transferred to floor care, but will remain in ICU at this time.  Levophed titrated to 6 mcg/min to maintain SBP > 90

## 2017-04-28 NOTE — Progress Notes (Signed)
Patient ID: [name], female   DOB: [birth year], 66 y.o.   MRN: [id number]     [employer] PROGRESS NOTE  [name] DQQ:[id number] DOB: 11-25-1950 DOA: 04/08/2017 PCP: Tracie Harrier, MD  HPI/Subjective: she is awake, alert, oriented. Started back on Levophed drip. She says that whatever she eats comes out. No abdominal pain.  Objective: Vitals:   04/28/17 0900 04/28/17 1000  BP: 101/76   Pulse: (!) 112 98  Resp: 20 (!) 25  Temp:      Filed Weights   04/26/17 0900 04/26/17 1530 04/28/17 0412  Weight: 79.3 kg (174 lb 13.2 oz) 78.9 kg (174 lb) 78.9 kg (173 lb 15.1 oz)    ROS: Review of Systems  Constitutional: Negative for chills, fever and malaise/fatigue.  HENT: Negative for hearing loss.   Eyes: Negative for blurred vision, double vision and photophobia.  Respiratory: Negative for cough, hemoptysis and shortness of breath.   Cardiovascular: Negative for chest pain, palpitations, orthopnea and leg swelling.  Gastrointestinal: Negative for abdominal pain, diarrhea and vomiting.  Genitourinary: Negative for dysuria and urgency.  Musculoskeletal: Negative for myalgias and neck pain.  Skin: Negative for rash.  Neurological: Positive for weakness. Negative for dizziness, focal weakness, seizures and headaches.  Psychiatric/Behavioral: Negative for memory loss. The patient does not have insomnia.     Exam: Physical Exam  HENT:  Head: Atraumatic.  Nose: No mucosal edema.  Mouth/Throat: No oropharyngeal exudate or posterior oropharyngeal edema.  Eyes: Pupils are equal, round, and reactive to light. Lids are normal.  Icteric sclera  Neck: Carotid bruit is not present. No thyromegaly present.  Cardiovascular: Regular rhythm, S1 normal, S2 normal and normal heart sounds.   Respiratory: She has no decreased breath sounds. She has no wheezes. She has no rales.  GI: Soft. Bowel sounds are normal. There is no tenderness.  Musculoskeletal:       Right ankle: She exhibits  swelling.       Left ankle: She exhibits swelling.  Lymphadenopathy:    She has no cervical adenopathy.  Neurological:  Able to wiggle toes bilateral feet. Able to squeeze hands.  Skin: Skin is warm. No cyanosis.  Left lower extremity covered today Right foot with some blisters that are healing Bruising right neck and left thigh  Psychiatric: Her affect is blunt.  Skin infection with cellulitis of left ankle.    Data Reviewed: Basic Metabolic Panel:  Recent Labs Lab 04/24/17 1241 04/24/17 1452 04/24/17 2215 04/25/17 0441 04/25/17 1730 04/25/17 2335 04/26/17 0522 04/27/17 0415 04/27/17 1730 04/28/17 0507  NA 134*  --  135 138 140  --  138 138  --  138  K 4.7  --  4.2 4.4 4.0  --  4.3 4.7  --  3.5  CL 100*  --  100* 101 100*  --  100* 100*  --  96*  CO2 26  --  27 26 29   --  27 25  --  30  GLUCOSE 108*  --  112* 98 110*  --  129* 77  --  79  BUN 56*  --  51* 48* 24*  --  50* 76*  --  38*  CREATININE 1.02*  --  0.96 1.03* 0.55  --  1.05* 1.96*  --  1.41*  CALCIUM 8.4*  --  8.4* 8.7* 8.6*  --  8.2* 7.7*  --  7.8*  MG 10.0* 3.0* 2.0 1.9 1.8  --   --   --   --   --  PHOS 2.4*  --  2.7 3.3 2.8 3.8  --  7.4* 4.5  --    Liver Function Tests: CBC:  Recent Labs Lab 04/23/17 0437 04/25/17 0441 04/26/17 0742 04/27/17 0415 04/28/17 0507  WBC 9.9 8.6 10.0 8.4 8.4  NEUTROABS  --   --  7.8* 5.9  --   HGB 7.4* 6.8* 8.6* 8.3* 8.4*  HCT 22.6* 20.6* 25.6* 24.6* 24.8*  MCV 97.4 98.4 95.9 96.4 97.8  PLT 73* 66* 103* 104* 120*   BNP (last 3 results)  Recent Labs  04/08/17 0139  BNP 2,012.0*     CBG:  Recent Labs Lab 04/28/17 0211 04/28/17 0237 04/28/17 0352 04/28/17 0527 04/28/17 0736  GLUCAP 64* 92 82 75 91    Recent Results (from the past 240 hour(s))  Culture, blood (Routine X 2) w Reflex to ID Panel     Status: None (Preliminary result)   Collection Time: 04/26/17  7:30 AM  Result Value Ref Range Status   Specimen Description BLOOD PICC LINE  Final    Special Requests   Final    BOTTLES DRAWN AEROBIC AND ANAEROBIC Blood Culture adequate volume   Culture NO GROWTH 2 DAYS  Final   Report Status PENDING  Incomplete  Culture, blood (Routine X 2) w Reflex to ID Panel     Status: None (Preliminary result)   Collection Time: 04/26/17  7:30 AM  Result Value Ref Range Status   Specimen Description BLOOD RIGHT HAND  Final   Special Requests   Final    BOTTLES DRAWN AEROBIC AND ANAEROBIC Blood Culture adequate volume   Culture NO GROWTH 2 DAYS  Final   Report Status PENDING  Incomplete     Studies: No results found.  Scheduled Meds: . amiodarone  200 mg Per Tube BID  . carvedilol  3.125 mg Per Tube BID WC  . collagenase   Topical Daily  . famotidine  20 mg Per Tube QHS  . feeding supplement  1 Container Oral TID BM  . levothyroxine  100 mcg Per Tube QAC breakfast  . midodrine  10 mg Oral TID WC  . multivitamin  1 tablet Oral QHS  . QUEtiapine  25 mg Per Tube QHS  . silver sulfADIAZINE   Topical BID   Continuous Infusions: . albumin human    . dextrose    . norepinephrine (LEVOPHED) Adult infusion 5.973 mcg/min (04/28/17 1000)    Assessment/Plan:  1. Acute delirium.  Improved 2. Septic shock with multiorgan failure. Overall prognosis is poor. Patient currently a DO NOT RESUSCITATE. clinically better.LFT   Patient is clinically improved. Hypertension is the biggest problem needing Levophed. Continue midodrine, wean off Levophed as tolerated to keep map 60.   3. PEA Cardiopulmonary arrest. For 2 min s/p intubation and extubation.  Shock liver from cardiopulmonary arrest. Liver function trending better.  4. Acute . Renal failure with proteinuria, hematuria: Baseline creatinine 0.62. Received CRRT ,Started on hemodialysis.received permacath , now on hemodialysis. 5. Acute respiratory failure status post extubation and on nasal cannula. continue O2 Bar[place], NEB. 6. Hypomagnesemia, hypophosphatemia and hypokalemia. On electrolyte  protocol 7.  8. Atrial fibrillation with RVR,  continue amiodarone and Lopressor IV when necessary. Pradaxa on hold.  9.  10. Coagulopathy. Could be secondary to shock liver.better 11. Diarrhea. resolved   12. Cardiomyopathy on echocardiogram. 13. Anemia and thrombocytopenia. Continue to monitor closely  14. Hypothyroidism unspecified on levothyroxine 15. Depression on Zoloft 16. Nutritional status. Removed.ng tube., started feeding, liberalize her  diet because she will not eat what we offer liberalize the diet for some time to increase food intake 17. LATCH referal in progress. Code Status:     Code Status Orders        Start     Ordered   04/08/17 0651  Full code  Continuous     04/08/17 0650    Code Status History    Date Active Date Inactive Code Status Order ID Comments User Context   This patient has a current code status but no historical code status.     Family Communication: Spoke with Husband at bedside Disposition Plan: To be determined based on clinical course  Consultants:  Critical care specialist  Infectious disease  Nephrology  Palliative care  Cardiology  Time spent: 25 minutes.   [employer]

## 2017-04-28 NOTE — Evaluation (Signed)
Occupational Therapy Evaluation Patient Details Name: [name] MRN: [id number] DOB: 1951-08-18 Today's Date: 04/28/2017    History of Present Illness 66yo female pt presented to ER on 7/1 secondary to LE swelling, difficulty walking; admitted with sepsis due to LE cellulitis.  Patient hospital course complicated by PEA arrest requiring ACLS with subsequent intubation (7/4-7/9), CRRT 7/2-7/18 with transition to HD (R IJ perm cath).   Clinical Impression   Pt seen for OT evaluation this date. Pt was independent with all ADL, IADL including driving at baseline and is eager to return to PLOF. Pt alert and oriented, in good spirits, agreeable to work with OT. Pt presents with profound weakness resulting in significant functional deficits in all aspects of self care. Pt educated in positioning to support self feeding and required pillow support under BUE to improve passive shoulder flexion in order to bring a spoon to her mouth. No spilling noted, but pt required additional effort/time to hold small container of grits, scoop, and bring spoon to mouth. Pt will benefit from skilled OT services to address noted impairments below and functional deficits in bathing, dressing, and toileting in order to maximize return to PLOF. Recommend LTACH placement following hospitalization.    Follow Up Recommendations  LTACH    Equipment Recommendations  3 in 1 bedside commode    Recommendations for Other Services       Precautions / Restrictions Precautions Precautions: Fall Precaution Comments: R IJ perm cath, L IJ central line Restrictions Weight Bearing Restrictions: No      Mobility Bed Mobility Overal bed mobility: Needs Assistance Bed Mobility: Supine to Sit;Sit to Supine     Supine to sit: Mod assist;Max assist Sit to supine: Max assist;Total assist   General bed mobility comments: requires extensive assist for LE management, truncal elevation; very limited ability to actively assist with  bilat UEs due to profound weakness  Transfers                 General transfer comment: unsafe to attempt this date    Balance Overall balance assessment: Needs assistance Sitting-balance support: No upper extremity supported;Feet supported Sitting balance-Leahy Scale: Fair Sitting balance - Comments: forward flexed, sacral sitting position; delayed balance reactions, VC to improve posture                                   ADL either performed or assessed with clinical judgement   ADL Overall ADL's : Needs assistance/impaired Eating/Feeding: Bed level;Set up Eating/Feeding Details (indicate cue type and reason): pt unable to open some containers (OJ), increased effort/time to open small butter container and grits lid, required set up of tray to optimize access with BUE supported by pillows to improve shoulder flexion in order for pt to bring food to mouth, gross grasp used for scooping and bringing spoon to mouth but no spillage Grooming: Wash/dry face;Set up;Bed level   Upper Body Bathing: Maximal assistance;Bed level   Lower Body Bathing: Maximal assistance;Bed level   Upper Body Dressing : Maximal assistance;Bed level   Lower Body Dressing: Maximal assistance;Bed level     Toilet Transfer Details (indicate cue type and reason): unable at this time         Functional mobility during ADLs:  (unsafe to ambulate at this time) General ADL Comments: pt generally requires max assist for all bathing, dressing, and toileting tasks due to profound weakness; educated pt/spouse in BUE self care  tasks and ROM/strengthening exercises pt can perform in bed throughout the day to support strengthening     Vision Baseline Vision/History: Wears glasses Wears Glasses: At all times Patient Visual Report: No change from baseline Vision Assessment?: No apparent visual deficits     Perception     Praxis      Pertinent Vitals/Pain Pain Assessment: No/denies pain      Hand Dominance Right   Extremity/Trunk Assessment Upper Extremity Assessment Upper Extremity Assessment: Generalized weakness (grossly 2+/5 - 3-/5 bilaterally, fair grip strength, weaker proximally>distally)   Lower Extremity Assessment Lower Extremity Assessment: Generalized weakness (grossly 3-/5 bilaterally, BLE edema, appears to be improving per MD report)   Cervical / Trunk Assessment Cervical / Trunk Assessment: Normal   Communication Communication Communication: No difficulties   Cognition Arousal/Alertness: Awake/alert Behavior During Therapy: WFL for tasks assessed/performed Overall Cognitive Status: Within Functional Limits for tasks assessed                                     General Comments  mild swelling in BLE, improving per MD report during session    Exercises     Shoulder Instructions      Home Living Family/patient expects to be discharged to:: Other (Comment) (LTACH) Living Arrangements: Spouse/significant other Available Help at Discharge: Family Type of Home: House Home Access: Stairs to enter Technical brewer of Steps: 4 Entrance Stairs-Rails: Right Home Layout: One level     Bathroom Shower/Tub: Occupational psychologist: Standard Bathroom Accessibility: Yes How Accessible: Accessible via walker Home Equipment: None          Prior Functioning/Environment Level of Independence: Independent        Comments: Indep with ADLs, household and community mobility; + driving; denies fall history.        OT Problem List: Decreased strength;Decreased range of motion;Increased edema;Decreased activity tolerance;Decreased knowledge of use of DME or AE;Impaired balance (sitting and/or standing);Impaired UE functional use      OT Treatment/Interventions: Self-care/ADL training;Therapeutic exercise;Therapeutic activities;DME and/or AE instruction;Energy conservation;Patient/family education    OT Goals(Current  goals can be found in the care plan section) Acute Rehab OT Goals Patient Stated Goal: to get stronger OT Goal Formulation: With patient/family Time For Goal Achievement: 05/12/17 Potential to Achieve Goals: Good  OT Frequency: Min 2X/week   Barriers to D/C: Inaccessible home environment          Co-evaluation              AM-PAC PT "6 Clicks" Daily Activity     Outcome Measure Help from another person eating meals?: A Little Help from another person taking care of personal grooming?: A Little Help from another person toileting, which includes using toliet, bedpan, or urinal?: A Lot Help from another person bathing (including washing, rinsing, drying)?: A Lot Help from another person to put on and taking off regular upper body clothing?: A Lot Help from another person to put on and taking off regular lower body clothing?: A Lot 6 Click Score: 14   End of Session    Activity Tolerance: Patient tolerated treatment well Patient left: in bed;with call bell/phone within reach;with bed alarm set;with family/visitor present  OT Visit Diagnosis: Other abnormalities of gait and mobility (R26.89);Muscle weakness (generalized) (M62.81)                Time: 6384-5364 OT Time Calculation (min): 35 min Charges:  OT General Charges $OT Visit: 1 Procedure OT Evaluation $OT Eval Moderate Complexity: 1 Procedure OT Treatments $Self Care/Home Management : 8-22 mins G-Codes:     Jeni Salles, MPH, MS, OTR/L ascom [phone] 04/28/17, 10:05 AM

## 2017-04-28 NOTE — Progress Notes (Signed)
PULMONARY / CRITICAL CARE MEDICINE   Name: Pennie Vanblarcom MRN: [id number] DOB: Jan 30, 1951    ADMISSION DATE:  04/08/2017  PT PROFILE:   66 y.o. F with PMH of CAF, hypothyroidism admitted via ED to ICU with many days of progressive LE edema, blistering lesions on BLE, progressive weakness. Adm diagnosis of septic shock, cellulitis, AKI.  Pt PEA arrested 07/4 ACLS protocol initiated ROSC within 2 minutes following interventions intubated during cardiac arrest and extubated 07/9.   MAJOR EVENTS/TEST RESULTS: 07/01 LE venous US: no DVT 07/01 Echocardiogram:  LVEF 25-30% 07/01 Nephrology consultation 07/02 ID consultation 07/02 CRRT initiated 07/03 Off vasopressors. Worsening tachycardia - amiodarone initiated 07/04 Pt PEA arrest ACLS protocol initiated ROSC 2 minutes now back on vasopressors  07/05 Intubated, shock req high dose vasopressors, on CRRT, elevated LFTs c/w congestion/shock liver, pulmonary edema vs ARDS on CXR 07/09 Pt extubated  07/11 remains on CRRT and vasopressors, resp status stable 07/12 remains on CRRT, on amiodarone. Dr Mortimer Fries addressed goals of care with pt's husband. Made DNR/DNI, continue current management without significant escalation. NGT placed for nutrition 07/13 off vasopressors, cont on CRRT with increased UF to 200/hr.  07/14 PCCM/Nephrology agree to continue CRRT through WE. 07/15 Increased confusion and intermittent agitation. Quetiapine initiated 07/16 Remains on CRRT, ID signed off  07/18 CRRT stopped pt transitioned to HD  07/19 Dobhoff discontinued and diet advanced  07/19 PermCath placement by vascular surgery  INDWELLING DEVICES:: R fem HD cath 07/02 >>out  ETT 07/04 >>07/09 Left IJ 07/04 >>  R IJ HD cath 07/12 >> removed PermCath 7/19  MICRO DATA: MRSA PCR 07/01 >> NEG Urine 07/01 >> NEG Blood 07/01 >> NEG C diff 07/01 >> NEG GI panel 07/02 >> NEG Resp 07/05 >> moderate candida tropicalis  Blood 07/08 >> NEG Blood 07/09 >> NEG Resp 07/08 >>  abundant candida tropicalis Cdiff 07/09 >> NEG   ANTIMICROBIALS:  Vanc 07/01 >> 07/02 Pip-tazo 07/01 >> 7/10 Vancomycin 07/6 >>7/10  SUBJECTIVE:  No acute issues overnight  On low dose norepinephrine Alert and awake   VITAL SIGNS: BP (!) 77/52 (BP Location: Right Arm)   Pulse 99   Temp 97.6 F (36.4 C) (Oral)   Resp 14   Ht 5\' 5"  (1.651 m)   Wt 173 lb 15.1 oz (78.9 kg)   SpO2 91%   BMI 28.95 kg/m   VENTILATOR SETTINGS:    INTAKE / OUTPUT: I/O last 3 completed shifts: In: 247.1 [P.O.:150; I.V.:97.1] Out: 1500 [Other:1500]  PHYSICAL EXAMINATION: General: NAD Neuro: RASS 0, no focal deficits HEENT: NCAT, sclericterus Cardiovascular: IRIR, no murmurs noted Lungs: Clear anteriorly, even, non labored; no wheezes, rales, rhonchi, or crackles  Abdomen: obese, soft, ND, NT  Ext: improved BUE and BLE edema  LABS:  BMET  Recent Labs Lab 04/26/17 0522 04/27/17 0415 04/28/17 0507  NA 138 138 138  K 4.3 4.7 3.5  CL 100* 100* 96*  CO2 27 25 30   BUN 50* 76* 38*  CREATININE 1.05* 1.96* 1.41*  GLUCOSE 129* 77 79    Electrolytes  Recent Labs Lab 04/24/17 2215 04/25/17 0441 04/25/17 1730 04/25/17 2335 04/26/17 0522 04/27/17 0415 04/27/17 1730 04/28/17 0507  CALCIUM 8.4* 8.7* 8.6*  --  8.2* 7.7*  --  7.8*  MG 2.0 1.9 1.8  --   --   --   --   --   PHOS 2.7 3.3 2.8 3.8  --  7.4* 4.5  --     CBC  Recent Labs  Lab 04/26/17 0742 04/27/17 0415 04/28/17 0507  WBC 10.0 8.4 8.4  HGB 8.6* 8.3* 8.4*  HCT 25.6* 24.6* 24.8*  PLT 103* 104* 120*    Coag's  Recent Labs Lab 04/26/17 0522 04/27/17 0415 04/28/17 0507  APTT 33 33 35    Sepsis Markers No results for input(s): LATICACIDVEN, PROCALCITON, O2SATVEN in the last 168 hours.  ABG No results for input(s): PHART, PCO2ART, PO2ART in the last 168 hours.  Liver Enzymes  Recent Labs Lab 04/23/17 0437  04/25/17 0441 04/25/17 1730 04/28/17 0507  AST 83*  --  66*  --  51*  ALT 139*  --  93*  --   59*  ALKPHOS 421*  --  322*  --  235*  BILITOT 3.2*  --  2.9*  --  2.4*  ALBUMIN 4.0  4.0  < > 4.1  4.1 4.3 3.4*  < > = values in this interval not displayed.  Cardiac Enzymes No results for input(s): TROPONINI, PROBNP in the last 168 hours.  Glucose  Recent Labs Lab 04/27/17 2346 04/28/17 0211 04/28/17 0237 04/28/17 0352 04/28/17 0527 04/28/17 0736  GLUCAP 84 64* 92 82 75 91    CXR: NNF  ASSESSMENT / PLAN: Acute hypoxic respiratory failure-slowly resolving Pulmonary edema Severe dilated cardiomyopathy Chronic AF Status post PEA arrest 07/04  Cardiogenic shock, resolved Anuric AKI - likely cardiorenal plus ATN Anasarca, improving Protein-calorie malnutrition Elevated LFTs due to shock liver, hepatic congestion - improving Diarrhea, resolved Anemia without evidence of acute blood loss Thrombocytopenia-improving  Elevated prothrombin time, resolved LE cellulitis - fully treated Severe deconditioning  Plan: Continue supplemental oxygen to maintain O2 sats >92% HD per Nephrology recommendations  Prn levophed to maintain map goal >60 Increased midodrine dose and frequency-07/19 Continue amiodarone, synthroid, and carvedilol  Monitor BMET intermittently Monitor I/Os Correct electrolytes as indicated DVT px: SCDs Monitor CBC intermittently Transfuse per usual guidelines Discontinue tube feeds advance diet  Continue physical therapy-goal OOB to Chair  DNR/DNI  Vascular surgery  permcath placement     DHRCBU [name], M.D.  [facility]  Medical Director [place] Director [place] Department

## 2017-04-28 NOTE — Progress Notes (Signed)
Per Dr Vianne Bulls ok to change diet to regular to encourage PO intake.

## 2017-04-29 LAB — GLUCOSE, CAPILLARY
Glucose-Capillary: 107 mg/dL — ABNORMAL HIGH (ref 65–99)
Glucose-Capillary: 108 mg/dL — ABNORMAL HIGH (ref 65–99)
Glucose-Capillary: 112 mg/dL — ABNORMAL HIGH (ref 65–99)
Glucose-Capillary: 117 mg/dL — ABNORMAL HIGH (ref 65–99)
Glucose-Capillary: 92 mg/dL (ref 65–99)

## 2017-04-29 NOTE — Progress Notes (Signed)
PULMONARY / CRITICAL CARE MEDICINE   Name: [name] MRN: [id number] DOB: 19-Feb-1951    ADMISSION DATE:  04/08/2017  PT PROFILE:   66 y.o. F with PMH of CAF, hypothyroidism admitted via ED to ICU with many days of progressive LE edema, blistering lesions on BLE, progressive weakness. Adm diagnosis of septic shock, cellulitis, AKI.  Pt PEA arrested 07/4 ACLS protocol initiated ROSC within 2 minutes following interventions intubated during cardiac arrest and extubated 07/9.   MAJOR EVENTS/TEST RESULTS: 07/01 LE venous US: no DVT 07/01 Echocardiogram:  LVEF 25-30% 07/01 Nephrology consultation 07/02 ID consultation 07/02 CRRT initiated 07/03 Off vasopressors. Worsening tachycardia - amiodarone initiated 07/04 Pt PEA arrest ACLS protocol initiated ROSC 2 minutes now back on vasopressors  07/05 Intubated, shock req high dose vasopressors, on CRRT, elevated LFTs c/w congestion/shock liver, pulmonary edema vs ARDS on CXR 07/09 Pt extubated  07/11 remains on CRRT and vasopressors, resp status stable 07/12 remains on CRRT, on amiodarone. Dr Mortimer Fries addressed goals of care with pt's husband. Made DNR/DNI, continue current management without significant escalation. NGT placed for nutrition 07/13 off vasopressors, cont on CRRT with increased UF to 200/hr.  07/14 PCCM/Nephrology agree to continue CRRT through WE. 07/15 Increased confusion and intermittent agitation. Quetiapine initiated 07/16 Remains on CRRT, ID signed off  07/18 CRRT stopped pt transitioned to HD  07/19 Dobhoff discontinued and diet advanced  07/19 PermCath placement by vascular surgery  INDWELLING DEVICES:: R fem HD cath 07/02 >>out  ETT 07/04 >>07/09 Left IJ 07/04 >>  R IJ HD cath 07/12 >> removed PermCath 7/19  MICRO DATA: MRSA PCR 07/01 >> NEG Urine 07/01 >> NEG Blood 07/01 >> NEG C diff 07/01 >> NEG GI panel 07/02 >> NEG Resp 07/05 >> moderate candida tropicalis  Blood 07/08 >> NEG Blood 07/09 >> NEG Resp 07/08 >>  abundant candida tropicalis Cdiff 07/09 >> NEG   ANTIMICROBIALS:  Vanc 07/01 >> 07/02 Pip-tazo 07/01 >> 7/10 Vancomycin 07/6 >>7/10  SUBJECTIVE:  No acute issues overnight  On low dose norepinephrine Alert and awake   VITAL SIGNS: BP 109/76 (BP Location: Right Arm)   Pulse (!) 101   Temp 98.2 F (36.8 C) (Axillary)   Resp 10   Ht 5\' 5"  (1.651 m)   Wt 173 lb 15.1 oz (78.9 kg)   SpO2 94%   BMI 28.95 kg/m   VENTILATOR SETTINGS:    INTAKE / OUTPUT: I/O last 3 completed shifts: In: 193.1 [P.O.:118; I.V.:75.1] Out: 1500 [Other:1500]  PHYSICAL EXAMINATION: General: NAD Neuro: RASS 0, no focal deficits HEENT: NCAT, sclericterus Cardiovascular: IRIR, no murmurs noted Lungs: Clear anteriorly, even, non labored; no wheezes, rales, rhonchi, or crackles  Abdomen: obese, soft, ND, NT  Ext: improved BUE and BLE edema  LABS:  BMET  Recent Labs Lab 04/26/17 0522 04/27/17 0415 04/28/17 0507  NA 138 138 138  K 4.3 4.7 3.5  CL 100* 100* 96*  CO2 27 25 30   BUN 50* 76* 38*  CREATININE 1.05* 1.96* 1.41*  GLUCOSE 129* 77 79    Electrolytes  Recent Labs Lab 04/24/17 2215 04/25/17 0441 04/25/17 1730 04/25/17 2335 04/26/17 0522 04/27/17 0415 04/27/17 1730 04/28/17 0507  CALCIUM 8.4* 8.7* 8.6*  --  8.2* 7.7*  --  7.8*  MG 2.0 1.9 1.8  --   --   --   --   --   PHOS 2.7 3.3 2.8 3.8  --  7.4* 4.5  --     CBC  Recent Labs  Lab 04/26/17 0742 04/27/17 0415 04/28/17 0507  WBC 10.0 8.4 8.4  HGB 8.6* 8.3* 8.4*  HCT 25.6* 24.6* 24.8*  PLT 103* 104* 120*    Coag's  Recent Labs Lab 04/26/17 0522 04/27/17 0415 04/28/17 0507  APTT 33 33 35    Sepsis Markers No results for input(s): LATICACIDVEN, PROCALCITON, O2SATVEN in the last 168 hours.  ABG No results for input(s): PHART, PCO2ART, PO2ART in the last 168 hours.   CXR: NNF  ASSESSMENT / PLAN: Acute hypoxic respiratory failure- resolving Pulmonary edema Severe dilated cardiomyopathy Chronic  AF Status post PEA arrest 07/04  Cardiogenic shock, resolved Anuric AKI - likely cardiorenal plus ATN Anasarca, slowly improving Protein-calorie malnutrition Elevated LFTs due to shock liver, hepatic congestion - improving Diarrhea, resolved Anemia without evidence of acute blood loss Thrombocytopenia-improving  Elevated prothrombin time, resolved LE cellulitis - fully treated Severe deconditioning  Plan: Continue supplemental oxygen to maintain O2 sats >92% HD per Nephrology recommendations  Prn levophed to maintain map goal >60 Increased midodrine dose and frequency-07/19 Continue amiodarone, synthroid, and carvedilol  Monitor BMET intermittently Monitor I/Os Correct electrolytes as indicated DVT px: SCDs Monitor CBC intermittently Transfuse per usual guidelines Discontinue tube feeds advance diet  Continue physical therapy-goal OOB to Chair  DNR/DNI  Vascular surgery  permcath placed    FPOIPP [name], M.D.  [facility]  Medical Director [place] Director [place] Department

## 2017-04-29 NOTE — Progress Notes (Signed)
[facility]  ROUNDING NOTE   Subjective:  Patient still anuric. Remains on a bit of norepinephrine. Patient due for dialysis again tomorrow.   Objective:  Vital signs in last 24 hours:  Temp:  [97.7 F (36.5 C)-98.5 F (36.9 C)] 97.7 F (36.5 C) (07/22 0800) Pulse Rate:  [88-126] 102 (07/22 1000) Resp:  [8-20] 13 (07/22 1000) BP: (73-114)/(48-89) 108/89 (07/22 1000) SpO2:  [92 %-95 %] 94 % (07/22 0400)  Weight change:  Filed Weights   04/26/17 0900 04/26/17 1530 04/28/17 0412  Weight: 79.3 kg (174 lb 13.2 oz) 78.9 kg (174 lb) 78.9 kg (173 lb 15.1 oz)    Intake/Output: I/O last 3 completed shifts: In: 193.1 [P.O.:118; I.V.:75.1] Out: 1500 [Other:1500]   Intake/Output this shift:  Total I/O In: 210.3 [I.V.:210.3] Out: 0   Physical Exam: General: No acute distress   Head: Normocephalic, atraumatic  Eyes: EOM intact  Neck: supple  Lungs:  Basilar rales, normal effort  Heart: Irregular  Abdomen:  Soft, nontender, obese  Extremities: trace bilateral lower extremity edema   Neurologic: Awake, alert, following commands   Skin: Warm, dry  Access: Right IJ temp cath 7/12 [facility])    Basic Metabolic Panel:  Recent Labs Lab 04/24/17 1241 04/24/17 1452 04/24/17 2215 04/25/17 0441 04/25/17 1730 04/25/17 2335 04/26/17 0522 04/27/17 0415 04/27/17 1730 04/28/17 0507  NA 134*  --  135 138 140  --  138 138  --  138  K 4.7  --  4.2 4.4 4.0  --  4.3 4.7  --  3.5  CL 100*  --  100* 101 100*  --  100* 100*  --  96*  CO2 26  --  27 26 29   --  27 25  --  30  GLUCOSE 108*  --  112* 98 110*  --  129* 77  --  79  BUN 56*  --  51* 48* 24*  --  50* 76*  --  38*  CREATININE 1.02*  --  0.96 1.03* 0.55  --  1.05* 1.96*  --  1.41*  CALCIUM 8.4*  --  8.4* 8.7* 8.6*  --  8.2* 7.7*  --  7.8*  MG 10.0* 3.0* 2.0 1.9 1.8  --   --   --   --   --   PHOS 2.4*  --  2.7 3.3 2.8 3.8  --  7.4* 4.5  --     Liver Function Tests:  Recent Labs Lab 04/23/17 0437   04/24/17 1241 04/24/17 2215 04/25/17 0441 04/25/17 1730 04/28/17 0507  AST 83*  --   --   --  66*  --  51*  ALT 139*  --   --   --  93*  --  59*  ALKPHOS 421*  --   --   --  322*  --  235*  BILITOT 3.2*  --   --   --  2.9*  --  2.4*  PROT 6.5  --   --   --  6.3*  --  5.8*  ALBUMIN 4.0  4.0  < > 3.8 3.8 4.1  4.1 4.3 3.4*  < > = values in this interval not displayed. No results for input(s): LIPASE, AMYLASE in the last 168 hours. No results for input(s): AMMONIA in the last 168 hours.  CBC:  Recent Labs Lab 04/23/17 0437 04/25/17 0441 04/26/17 0742 04/27/17 0415 04/28/17 0507  WBC 9.9 8.6 10.0 8.4 8.4  NEUTROABS  --   --  7.8* 5.9  --   HGB 7.4* 6.8* 8.6* 8.3* 8.4*  HCT 22.6* 20.6* 25.6* 24.6* 24.8*  MCV 97.4 98.4 95.9 96.4 97.8  PLT 73* 66* 103* 104* 120*    Cardiac Enzymes: No results for input(s): CKTOTAL, CKMB, CKMBINDEX, TROPONINI in the last 168 hours.  BNP: Invalid input(s): POCBNP  CBG:  Recent Labs Lab 04/28/17 0527 04/28/17 0736 04/28/17 1558 04/29/17 0009 04/29/17 0739  GLUCAP 75 91 123* 107* 60    Microbiology: Results for orders placed or performed during the hospital encounter of 04/08/17  Culture, blood (routine x 2)     Status: None   Collection Time: 04/08/17  2:04 AM  Result Value Ref Range Status   Specimen Description BLOOD RIGHT ANTECUBITAL  Final   Special Requests   Final    BOTTLES DRAWN AEROBIC AND ANAEROBIC Blood Culture adequate volume   Culture NO GROWTH 5 DAYS  Final   Report Status 04/13/2017 FINAL  Final  Culture, blood (routine x 2)     Status: None   Collection Time: 04/08/17  2:06 AM  Result Value Ref Range Status   Specimen Description BLOOD LEFT ANTECUBITAL  Final   Special Requests   Final    BOTTLES DRAWN AEROBIC AND ANAEROBIC Blood Culture adequate volume   Culture NO GROWTH 5 DAYS  Final   Report Status 04/13/2017 FINAL  Final  C difficile quick scan w PCR reflex     Status: None   Collection Time: 04/08/17   5:09 AM  Result Value Ref Range Status   C Diff antigen NEGATIVE NEGATIVE Final   C Diff toxin NEGATIVE NEGATIVE Final   C Diff interpretation No C. difficile detected.  Final  MRSA PCR Screening     Status: None   Collection Time: 04/08/17  6:50 AM  Result Value Ref Range Status   MRSA by PCR NEGATIVE NEGATIVE Final    Comment:        The GeneXpert MRSA Assay (FDA approved for NASAL specimens only), is one component of a comprehensive MRSA colonization surveillance program. It is not intended to diagnose MRSA infection nor to guide or monitor treatment for MRSA infections.   Urine culture     Status: None   Collection Time: 04/08/17 12:16 PM  Result Value Ref Range Status   Specimen Description URINE, RANDOM  Final   Special Requests NONE  Final   Culture   Final    NO GROWTH Performed at [facility], [address], [place], [place] [address]    Report Status 04/10/2017 FINAL  Final  Aerobic Culture (superficial specimen)     Status: None   Collection Time: 04/09/17 11:32 AM  Result Value Ref Range Status   Specimen Description SKIN  Final   Special Requests NONE  Final   Gram Stain   Final    RARE WBC PRESENT, PREDOMINANTLY PMN NO SQUAMOUS EPITHELIAL CELLS SEEN NO ORGANISMS SEEN    Culture   Final    NO GROWTH 2 DAYS Performed at [facility], [place] [address], [place], [place] [address]    Report Status 04/11/2017 FINAL  Final  Gastrointestinal Panel by PCR , Stool     Status: None   Collection Time: 04/09/17  5:10 PM  Result Value Ref Range Status   Campylobacter species NOT DETECTED NOT DETECTED Final   Plesimonas shigelloides NOT DETECTED NOT DETECTED Final   Salmonella species NOT DETECTED NOT DETECTED Final   Yersinia enterocolitica NOT  DETECTED NOT DETECTED Final   Vibrio species NOT DETECTED NOT DETECTED Final   Vibrio cholerae NOT DETECTED NOT DETECTED Final   Enteroaggregative E coli (EAEC) NOT DETECTED NOT DETECTED Final    Enteropathogenic E coli (EPEC) NOT DETECTED NOT DETECTED Final   Enterotoxigenic E coli (ETEC) NOT DETECTED NOT DETECTED Final   Shiga like toxin producing E coli (STEC) NOT DETECTED NOT DETECTED Final   Shigella/Enteroinvasive E coli (EIEC) NOT DETECTED NOT DETECTED Final   Cryptosporidium NOT DETECTED NOT DETECTED Final   Cyclospora cayetanensis NOT DETECTED NOT DETECTED Final   Entamoeba histolytica NOT DETECTED NOT DETECTED Final   Giardia lamblia NOT DETECTED NOT DETECTED Final   Adenovirus F40/41 NOT DETECTED NOT DETECTED Final   Astrovirus NOT DETECTED NOT DETECTED Final   Norovirus GI/GII NOT DETECTED NOT DETECTED Final   Rotavirus A NOT DETECTED NOT DETECTED Final   Sapovirus (I, II, IV, and V) NOT DETECTED NOT DETECTED Final  Culture, respiratory (NON-Expectorated)     Status: None   Collection Time: 04/12/17 12:00 PM  Result Value Ref Range Status   Specimen Description TRACHEAL ASPIRATE  Final   Special Requests NONE  Final   Gram Stain   Final    MODERATE WBC PRESENT,BOTH PMN AND MONONUCLEAR MODERATE YEAST Performed at [facility], [address] [place], [place] [address]    Culture MODERATE CANDIDA TROPICALIS  Final   Report Status 04/14/2017 FINAL  Final  Culture, respiratory (NON-Expectorated)     Status: None   Collection Time: 04/15/17  7:48 AM  Result Value Ref Range Status   Specimen Description TRACHEAL ASPIRATE  Final   Special Requests Normal  Final   Gram Stain   Final    RARE WBC PRESENT, PREDOMINANTLY PMN FEW BUDDING YEAST SEEN Performed at [facility], [place] [address] [place], [place] [address]    Culture ABUNDANT CANDIDA TROPICALIS  Final   Report Status 04/18/2017 FINAL  Final  CULTURE, BLOOD (ROUTINE X 2) w Reflex to ID Panel     Status: None   Collection Time: 04/15/17 10:46 AM  Result Value Ref Range Status   Specimen Description BLOOD LEFT SUBCLAVIAN TRIPLE LUMEN  Final   Special Requests   Final    BOTTLES DRAWN AEROBIC AND  ANAEROBIC Blood Culture results may not be optimal due to an excessive volume of blood received in culture bottles   Culture NO GROWTH 5 DAYS  Final   Report Status 04/20/2017 FINAL  Final  CULTURE, BLOOD (ROUTINE X 2) w Reflex to ID Panel     Status: None   Collection Time: 04/16/17  1:21 PM  Result Value Ref Range Status   Specimen Description BLOOD BLOOD LEFT HAND  Final   Special Requests   Final    BOTTLES DRAWN AEROBIC AND ANAEROBIC Blood Culture results may not be optimal due to an inadequate volume of blood received in culture bottles   Culture NO GROWTH 5 DAYS  Final   Report Status 04/21/2017 FINAL  Final  CULTURE, BLOOD (ROUTINE X 2) w Reflex to ID Panel     Status: None   Collection Time: 04/16/17  1:27 PM  Result Value Ref Range Status   Specimen Description BLOOD A-LINE DRAW  Final   Special Requests   Final    BOTTLES DRAWN AEROBIC AND ANAEROBIC Blood Culture adequate volume   Culture NO GROWTH 5 DAYS  Final   Report Status 04/21/2017 FINAL  Final  C difficile  quick scan w PCR reflex     Status: None   Collection Time: 04/16/17 10:15 PM  Result Value Ref Range Status   C Diff antigen NEGATIVE NEGATIVE Final   C Diff toxin NEGATIVE NEGATIVE Final   C Diff interpretation No C. difficile detected.  Final    Comment: VALID  Culture, blood (Routine X 2) w Reflex to ID Panel     Status: None (Preliminary result)   Collection Time: 04/26/17  7:30 AM  Result Value Ref Range Status   Specimen Description BLOOD PICC LINE  Final   Special Requests   Final    BOTTLES DRAWN AEROBIC AND ANAEROBIC Blood Culture adequate volume   Culture NO GROWTH 3 DAYS  Final   Report Status PENDING  Incomplete  Culture, blood (Routine X 2) w Reflex to ID Panel     Status: None (Preliminary result)   Collection Time: 04/26/17  7:30 AM  Result Value Ref Range Status   Specimen Description BLOOD RIGHT HAND  Final   Special Requests   Final    BOTTLES DRAWN AEROBIC AND ANAEROBIC Blood Culture  adequate volume   Culture NO GROWTH 3 DAYS  Final   Report Status PENDING  Incomplete    Coagulation Studies: No results for input(s): LABPROT, INR in the last 72 hours.  Urinalysis: No results for input(s): COLORURINE, LABSPEC, PHURINE, GLUCOSEU, HGBUR, BILIRUBINUR, KETONESUR, PROTEINUR, UROBILINOGEN, NITRITE, LEUKOCYTESUR in the last 72 hours.  Invalid input(s): APPERANCEUR    Imaging: No results found.   Medications:   . albumin human    . dextrose 35 mL/hr at 04/29/17 1000  . norepinephrine (LEVOPHED) Adult infusion 2.987 mcg/min (04/29/17 1000)   . amiodarone  200 mg Per Tube BID  . carvedilol  3.125 mg Per Tube BID WC  . collagenase   Topical Daily  . famotidine  20 mg Per Tube QHS  . feeding supplement  1 Container Oral TID BM  . levothyroxine  100 mcg Per Tube QAC breakfast  . midodrine  10 mg Oral TID WC  . multivitamin  1 tablet Oral QHS  . QUEtiapine  25 mg Per Tube QHS  . silver sulfADIAZINE   Topical BID   acetaminophen, artificial tears, heparin, ipratropium-albuterol, LORazepam, metoprolol tartrate, morphine injection, [DISCONTINUED] ondansetron **OR** ondansetron (ZOFRAN) IV, phenol, sodium chloride flush, zinc oxide  Assessment/ Plan:  [name] is a 66 y.o. white female  with hypertension, hypothyroidism, atrial fibrillation, who was admitted to [facility] on 04/08/2017 for evaluation of increasing lower extremity edema.  Hospital course: Admitted on 04/08/2017 with significant peripheral edema and cellulitis. Started on CRRT for hypotension and anuric renal failure on 7/2. Patient weaned off CRRT on 7/4. However later that day, patient had cardiac arrest and coded. Intubated, sedated and restarted on three vasopressors. Restart on CRRT on 7/4.  2-D echo which shows EF 25-30%  1. Acute renal failure with proteinuria and hematuria: baseline creatinine of 0.62 09/2014.  Patient with massive peripheral edema, anasarca and hypoalbuminemia.  Recent IV contrast  exposure on 04/04/17.  - Serologic work up: compliments normal, negative SPEP/UPEP, ANA negative, ANCA negative, GBM negative, negative hepatitis - Patient remains anuric. We will proceed with another dialysis session tomorrow. At this time she remains on low-dose norepinephrine.  2. 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 Echocardiogram with EF of 25-30% - Continue midodrine to help sustain blood pressure.  Continue norepinephrine.   3.  Acute respiratory failure Extubated 7/9 - Continues to do well off ventilator.    4. 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  - management as per hospitalist.   5. Generalized edema. Will plan for HD tomorrow with UF target of 1.5kg.     LOS: [place], Veto [place] 7/22/201811:18 AM

## 2017-04-29 NOTE — Progress Notes (Signed)
Patient ID: [name], female   DOB: Mar 12, 1951, 66 y.o.   MRN: [id number]     [employer] PROGRESS NOTE  Lariza Cothron JXB:[id number] DOB: 1951/08/16 DOA: 04/08/2017 PCP: Tracie Harrier, MD  HPI/Subjective: she is awake, alert, oriented. Started back on Levophed drip. No complaints Objective: Vitals:   04/29/17 0912 04/29/17 1000  BP: 108/60 108/89  Pulse: 94 (!) 102  Resp:  13  Temp:      Filed Weights   04/26/17 0900 04/26/17 1530 04/28/17 0412  Weight: 79.3 kg (174 lb 13.2 oz) 78.9 kg (174 lb) 78.9 kg (173 lb 15.1 oz)    ROS: Review of Systems  Constitutional: Negative for chills, fever and malaise/fatigue.  HENT: Negative for hearing loss.   Eyes: Negative for blurred vision, double vision and photophobia.  Respiratory: Negative for cough, hemoptysis and shortness of breath.   Cardiovascular: Negative for chest pain, palpitations, orthopnea and leg swelling.  Gastrointestinal: Negative for abdominal pain, diarrhea and vomiting.  Genitourinary: Negative for dysuria and urgency.  Musculoskeletal: Negative for myalgias and neck pain.  Skin: Negative for rash.  Neurological: Positive for weakness. Negative for dizziness, focal weakness, seizures and headaches.  Psychiatric/Behavioral: Negative for memory loss. The patient does not have insomnia.     Exam: Physical Exam  HENT:  Head: Atraumatic.  Nose: No mucosal edema.  Mouth/Throat: No oropharyngeal exudate or posterior oropharyngeal edema.  Eyes: Pupils are equal, round, and reactive to light. Lids are normal.  Icteric sclera  Neck: Carotid bruit is not present. No thyromegaly present.  Cardiovascular: Regular rhythm, S1 normal, S2 normal and normal heart sounds.   Respiratory: She has no decreased breath sounds. She has no wheezes. She has no rales.  GI: Soft. Bowel sounds are normal. There is no tenderness.  Musculoskeletal:       Right ankle: She exhibits swelling.       Left ankle: She exhibits  swelling.  Lymphadenopathy:    She has no cervical adenopathy.  Neurological:  Able to wiggle toes bilateral feet. Able to squeeze hands.  Skin: Skin is warm. No cyanosis.  Left lower extremity covered today Right foot with some blisters that are healing Bruising right neck and left thigh  Psychiatric: Her affect is blunt.  Skin infection with cellulitis of left ankle.    Data Reviewed: Basic Metabolic Panel:  Recent Labs Lab 04/24/17 1241 04/24/17 1452 04/24/17 2215 04/25/17 0441 04/25/17 1730 04/25/17 2335 04/26/17 0522 04/27/17 0415 04/27/17 1730 04/28/17 0507  NA 134*  --  135 138 140  --  138 138  --  138  K 4.7  --  4.2 4.4 4.0  --  4.3 4.7  --  3.5  CL 100*  --  100* 101 100*  --  100* 100*  --  96*  CO2 26  --  27 26 29   --  27 25  --  30  GLUCOSE 108*  --  112* 98 110*  --  129* 77  --  79  BUN 56*  --  51* 48* 24*  --  50* 76*  --  38*  CREATININE 1.02*  --  0.96 1.03* 0.55  --  1.05* 1.96*  --  1.41*  CALCIUM 8.4*  --  8.4* 8.7* 8.6*  --  8.2* 7.7*  --  7.8*  MG 10.0* 3.0* 2.0 1.9 1.8  --   --   --   --   --   PHOS 2.4*  --  2.7 3.3  2.8 3.8  --  7.4* 4.5  --    Liver Function Tests: CBC:  Recent Labs Lab 04/23/17 0437 04/25/17 0441 04/26/17 0742 04/27/17 0415 04/28/17 0507  WBC 9.9 8.6 10.0 8.4 8.4  NEUTROABS  --   --  7.8* 5.9  --   HGB 7.4* 6.8* 8.6* 8.3* 8.4*  HCT 22.6* 20.6* 25.6* 24.6* 24.8*  MCV 97.4 98.4 95.9 96.4 97.8  PLT 73* 66* 103* 104* 120*   BNP (last 3 results)  Recent Labs  04/08/17 0139  BNP 2,012.0*     CBG:  Recent Labs Lab 04/28/17 0736 04/28/17 1558 04/29/17 0009 04/29/17 0739 04/29/17 1205  GLUCAP 91 123* 107* 92 112*    Recent Results (from the past 240 hour(s))  Culture, blood (Routine X 2) w Reflex to ID Panel     Status: None (Preliminary result)   Collection Time: 04/26/17  7:30 AM  Result Value Ref Range Status   Specimen Description BLOOD PICC LINE  Final   Special Requests   Final    BOTTLES  DRAWN AEROBIC AND ANAEROBIC Blood Culture adequate volume   Culture NO GROWTH 3 DAYS  Final   Report Status PENDING  Incomplete  Culture, blood (Routine X 2) w Reflex to ID Panel     Status: None (Preliminary result)   Collection Time: 04/26/17  7:30 AM  Result Value Ref Range Status   Specimen Description BLOOD RIGHT HAND  Final   Special Requests   Final    BOTTLES DRAWN AEROBIC AND ANAEROBIC Blood Culture adequate volume   Culture NO GROWTH 3 DAYS  Final   Report Status PENDING  Incomplete     Studies: No results found.  Scheduled Meds: . amiodarone  200 mg Per Tube BID  . carvedilol  3.125 mg Per Tube BID WC  . collagenase   Topical Daily  . famotidine  20 mg Per Tube QHS  . feeding supplement  1 Container Oral TID BM  . levothyroxine  100 mcg Per Tube QAC breakfast  . midodrine  10 mg Oral TID WC  . multivitamin  1 tablet Oral QHS  . QUEtiapine  25 mg Per Tube QHS  . silver sulfADIAZINE   Topical BID   Continuous Infusions: . albumin human    . dextrose 35 mL/hr at 04/29/17 1000  . norepinephrine (LEVOPHED) Adult infusion 2.987 mcg/min (04/29/17 1000)    Assessment/Plan:  1. Acute respiratory failure, cardiac arrest briefly, status post intubation, extubation, extubated on July 9. Breathing comfortably on room air. Patient had a PE and cardiac arrest, ACLS protocol followed that time . 2.  septic shock, cellulitis of the legs: Treated, still needing low-dose Levophed. Patient has no lower septic shock. However still has hypotension needing low-dose Levophed. Recommend ultrasound is negative for DVT. Seen by ID.  3. Shock liver secondary to septic shock, hypotension: Improving. Patient is tolerating diet. Discontinue NG tube. 4. Acute renal failure with proteinuria, hematuria: Baseline creatinine 0.62. Patient has massive peripheral edema, anasarca, hypoalbuminemia. Seen by nephrology, r. She was on CRRT before no one from hemodialysis sessions. Patient has a Engineer, manufacturing.  She gets albumin with hemodialysis 5. .Hypotension with atrial fibrillation: Patient still on Levophed, respiratory drink, EF 25-30% had cardiogenic shock. The patient is on Coreg 3.125 mg twice a day, amiodarone 200 mg by mouth twice a day #6 hypothyroidism: Continue Synthroid.  #Depression: Continue Seroquel.  # diabetes mellitus type 2: Continue monitoring closely, patient started on D10 because of hypoglycemia but  she is not had any hypoglycemia, she is doing better.   Code Status:     Code Status Orders        Start     Ordered   04/08/17 0651  Full code  Continuous     04/08/17 0650    Code Status History    Date Active Date Inactive Code Status Order ID Comments User Context   This patient has a current code status but no historical code status.     Family Communication: Spoke with Husband at bedside Disposition Plan: To be determined based on clinical course  Consultants:  Critical care specialist  Infectious disease  Nephrology  Palliative care  Cardiology  Time spent: 25 minutes.   [employer]

## 2017-04-30 LAB — BASIC METABOLIC PANEL
ANION GAP: 13 (ref 5–15)
BUN: 74 mg/dL — ABNORMAL HIGH (ref 6–20)
CALCIUM: 7.9 mg/dL — AB (ref 8.9–10.3)
CO2: 26 mmol/L (ref 22–32)
CREATININE: 3.01 mg/dL — AB (ref 0.44–1.00)
Chloride: 98 mmol/L — ABNORMAL LOW (ref 101–111)
GFR, EST AFRICAN AMERICAN: 18 mL/min — AB (ref >60)
GFR, EST NON AFRICAN AMERICAN: 15 mL/min — AB (ref >60)
GLUCOSE: 66 mg/dL (ref 65–99)
Potassium: 4.6 mmol/L (ref 3.5–5.1)
Sodium: 137 mmol/L (ref 135–145)

## 2017-04-30 LAB — GLUCOSE, CAPILLARY
Glucose-Capillary: 65 mg/dL (ref 65–99)
Glucose-Capillary: 173 mg/dL — ABNORMAL HIGH (ref 65–99)

## 2017-04-30 LAB — PHOSPHORUS: Phosphorus: 7.8 mg/dL — ABNORMAL HIGH (ref 2.5–4.6)

## 2017-04-30 MED ORDER — BOOST / RESOURCE BREEZE PO LIQD
1.0000 | Freq: Every day | ORAL | Status: DC
  Administered 2017-04-30 – 2017-05-06 (×21): 1 via ORAL

## 2017-04-30 MED ORDER — LEVOTHYROXINE SODIUM 100 MCG PO TABS
100.0000 ug | ORAL_TABLET | Freq: Every day | ORAL | Status: DC
  Administered 2017-05-01 – 2017-05-07 (×7): 100 ug via ORAL
  Filled 2017-04-30 (×7): qty 1

## 2017-04-30 MED ORDER — QUETIAPINE FUMARATE 25 MG PO TABS
25.0000 mg | ORAL_TABLET | Freq: Every day | ORAL | Status: DC
  Administered 2017-04-30 – 2017-05-06 (×7): 25 mg via ORAL
  Filled 2017-04-30 (×7): qty 1

## 2017-04-30 MED ORDER — MIDODRINE HCL 5 MG PO TABS
10.0000 mg | ORAL_TABLET | Freq: Once | ORAL | Status: DC
  Filled 2017-04-30: qty 2

## 2017-04-30 MED ORDER — FAMOTIDINE 20 MG PO TABS
20.0000 mg | ORAL_TABLET | Freq: Every day | ORAL | Status: DC
  Administered 2017-04-30 – 2017-05-06 (×7): 20 mg via ORAL
  Filled 2017-04-30 (×7): qty 1

## 2017-04-30 MED ORDER — AMIODARONE HCL 200 MG PO TABS
200.0000 mg | ORAL_TABLET | Freq: Two times a day (BID) | ORAL | Status: DC
  Administered 2017-04-30 – 2017-05-07 (×14): 200 mg via ORAL
  Filled 2017-04-30 (×14): qty 1

## 2017-04-30 MED ORDER — CARVEDILOL 6.25 MG PO TABS
3.1250 mg | ORAL_TABLET | Freq: Two times a day (BID) | ORAL | Status: DC
  Administered 2017-04-30 – 2017-05-02 (×5): 3.125 mg via ORAL
  Filled 2017-04-30 (×4): qty 1

## 2017-04-30 MED ORDER — SODIUM CHLORIDE 0.45 % IV SOLN
INTRAVENOUS | Status: DC
  Administered 2017-04-30: 11:00:00 via INTRAVENOUS

## 2017-04-30 NOTE — Plan of Care (Signed)
Problem: Safety: Goal: Ability to remain free from injury will improve Outcome: Progressing Remains safe without falls this shift.  Problem: Health Behavior/Discharge Planning: Goal: Ability to manage health-related needs will improve Outcome: Progressing Encouraged and assisted with providing own po nutrition.  Holding cup on own and putting to mouth to drink.  Successful and stable with activity.  No s/sx aspiration.

## 2017-04-30 NOTE — Progress Notes (Signed)
OT Cancellation Note  Patient Details Name: [name] MRN: [id number] DOB: 04/03/1951   Cancelled Treatment:    Reason Eval/Treat Not Completed: Patient at procedure or test/ unavailable. Patient currently off unit for dialysis; unavailable for participation with OT treatment session.  Will re-attempt at later time/date as patient medically appropriate and available.  Jeni Salles, MPH, MS, OTR/L ascom [phone] 04/30/17, 12:57 PM

## 2017-04-30 NOTE — Plan of Care (Signed)
Problem: Fluid Volume: Goal: Ability to maintain a balanced intake and output will improve Outcome: Completed/Met Date Met: 04/30/17 Fluid balance maintained as pt is on HD MWF.  Fluid intake moderate.  No s/sx fluid overload.

## 2017-04-30 NOTE — Progress Notes (Signed)
Post hd vitals 

## 2017-04-30 NOTE — Plan of Care (Signed)
Problem: Skin Integrity: Goal: Risk for impaired skin integrity will decrease Outcome: Progressing BLE wounds and gluteal wound healing well.

## 2017-04-30 NOTE — Progress Notes (Signed)
Pre hd info 

## 2017-04-30 NOTE — Progress Notes (Signed)
PULMONARY / CRITICAL CARE MEDICINE   Name: [name] MRN: [id number] DOB: 1951/05/26    ADMISSION DATE:  04/08/2017  PT PROFILE:   66 y.o. F with PMH of CAF, hypothyroidism admitted via ED to ICU with many days of progressive LE edema, blistering lesions on BLE, progressive weakness. Adm diagnosis of septic shock, cellulitis, AKI.  Pt PEA arrested 07/4 ACLS protocol initiated ROSC within 2 minutes following interventions intubated during cardiac arrest and extubated 07/9.   MAJOR EVENTS/TEST RESULTS: 07/01 LE venous US: no DVT 07/01 Echocardiogram:  LVEF 25-30% 07/01 Nephrology consultation 07/02 ID consultation 07/02 CRRT initiated 07/03 Off vasopressors. Worsening tachycardia - amiodarone initiated 07/04 Pt PEA arrest ACLS protocol initiated ROSC 2 minutes now back on vasopressors  07/05 Intubated, shock req high dose vasopressors, on CRRT, elevated LFTs c/w congestion/shock liver, pulmonary edema vs ARDS on CXR 07/09 Pt extubated  07/11 remains on CRRT and vasopressors, resp status stable 07/12 remains on CRRT, on amiodarone. Dr Mortimer Fries addressed goals of care with pt's husband. Made DNR/DNI, continue current management without significant escalation. NGT placed for nutrition 07/13 off vasopressors, cont on CRRT with increased UF to 200/hr.  07/14 PCCM/Nephrology agree to continue CRRT through [facility]. 07/15 Increased confusion and intermittent agitation. Quetiapine initiated 07/16 Remains on CRRT, ID signed off  07/18 CRRT stopped pt transitioned to HD  07/19 Dobhoff discontinued and diet advanced  07/19 PermCath placement by vascular surgery  INDWELLING DEVICES:: R fem HD cath 07/02 >>out  ETT 07/04 >>07/09 Left IJ 07/04 >>  R IJ HD cath 07/12 >> removed PermCath 7/19  MICRO DATA: MRSA PCR 07/01 >> NEG Urine 07/01 >> NEG Blood 07/01 >> NEG C diff 07/01 >> NEG GI panel 07/02 >> NEG Resp 07/05 >> moderate candida tropicalis  Blood 07/08 >> NEG Blood 07/09 >> NEG Resp 07/08 >>  abundant candida tropicalis Cdiff 07/09 >> NEG   ANTIMICROBIALS:  Vanc 07/01 >> 07/02 Pip-tazo 07/01 >> 7/10 Vancomycin 07/6 >>7/10  SUBJECTIVE:  No acute issues overnight  Weaned off norepinephrine Patient remains Alert and awake   VITAL SIGNS: BP 106/80   Pulse (!) 118   Temp 98.8 F (37.1 C) (Oral)   Resp 14   Ht 5\' 5"  (1.651 m)   Wt 170 lb 3.1 oz (77.2 kg)   SpO2 90%   BMI 28.32 kg/m   VENTILATOR SETTINGS:    INTAKE / OUTPUT: I/O last 3 completed shifts: In: 799.8 [P.O.:494; I.V.:305.8] Out: 0   PHYSICAL EXAMINATION: General: NAD Neuro: RASS 0, no focal deficits HEENT: NCAT, sclericterus Cardiovascular: IRIR, no murmurs noted Lungs: Clear anteriorly, even, non labored; no wheezes, rales, rhonchi, or crackles  Abdomen: obese, soft, ND, NT  Ext: improved BUE and BLE edema  LABS:  BMET  Recent Labs Lab 04/27/17 0415 04/28/17 0507 04/30/17 0501  NA 138 138 137  K 4.7 3.5 4.6  CL 100* 96* 98*  CO2 25 30 26   BUN 76* 38* 74*  CREATININE 1.96* 1.41* 3.01*  GLUCOSE 77 79 66    Electrolytes  Recent Labs Lab 04/24/17 2215 04/25/17 0441 04/25/17 1730  04/27/17 0415 04/27/17 1730 04/28/17 0507 04/30/17 0501  CALCIUM 8.4* 8.7* 8.6*  < > 7.7*  --  7.8* 7.9*  MG 2.0 1.9 1.8  --   --   --   --   --   PHOS 2.7 3.3 2.8  < > 7.4* 4.5  --  7.8*  < > = values in this interval not displayed.  CBC  Recent Labs Lab 04/26/17 0742 04/27/17 0415 04/28/17 0507  WBC 10.0 8.4 8.4  HGB 8.6* 8.3* 8.4*  HCT 25.6* 24.6* 24.8*  PLT 103* 104* 120*    Coag's  Recent Labs Lab 04/26/17 0522 04/27/17 0415 04/28/17 0507  APTT 33 33 35    Sepsis Markers No results for input(s): LATICACIDVEN, PROCALCITON, O2SATVEN in the last 168 hours.  ABG No results for input(s): PHART, PCO2ART, PO2ART in the last 168 hours.   CXR: NNF  ASSESSMENT / PLAN: Acute hypoxic respiratory failure- resolving Pulmonary edema Severe dilated cardiomyopathy Chronic  AF Status post PEA arrest 07/04  Cardiogenic shock, resolved Anuric AKI - likely cardiorenal plus ATN Anasarca, slowly improving Protein-calorie malnutrition Elevated LFTs due to shock liver, hepatic congestion - improving Diarrhea, resolved Anemia without evidence of acute blood loss Thrombocytopenia-improving  Elevated prothrombin time, resolved LE cellulitis - fully treated Severe deconditioning  Plan: Continue supplemental oxygen to maintain O2 sats >92% HD per Nephrology recommendations  Prn levophed to maintain map goal >60 Continue midodrine dose and frequency-07/19 Continue amiodarone, synthroid, and carvedilol  Monitor BMET intermittently Monitor I/Os Correct electrolytes as indicated DVT px: SCDs Monitor CBC intermittently Transfuse per usual guidelines Continue physical therapy-goal OOB to Chair  DNR/DNI  Vascular surgery  permcath placed   Deep Ashby Dawes, MD.   Board Certified in Internal Medicine, Pulmonary Medicine, [place], and Sleep Medicine.  [facility] Office Number: [phone] Pager: [phone]  [name], M.D.  Merton Border, M.D

## 2017-04-30 NOTE — Progress Notes (Signed)
SLP Cancellation Note  Patient Details Name: [name] MRN: [id number] DOB: 1951/04/28   Cancelled treatment:       Reason Eval/Treat Not Completed: Patient at procedure or test/unavailable. Attempted ST treatment. Pt is currently out of room for a test. Will re-attempt at later time/date as pt is available.    [place],[name] 04/30/2017, 2:02 PM

## 2017-04-30 NOTE — Progress Notes (Signed)
PT Cancellation Note  Patient Details Name: [name] MRN: [id number] DOB: [birth year]   Cancelled Treatment:    Reason Eval/Treat Not Completed: Patient at procedure or test/unavailable (Patient currently off unit for dialysis; unavailable for participation with treatment session.  Will re-attempt at later time/date as patient medically appropriate and available.)   Laroy Mustard H. Owens Shark, PT, DPT, NCS 04/30/17, 12:38 PM [phone]

## 2017-04-30 NOTE — Plan of Care (Signed)
Problem: Activity: Goal: Risk for activity intolerance will decrease Outcome: Progressing Pt participating in active ROM exercises w/BUE.  Strength slowly improving.  Pt understands need to perform exercises independently in bed, but still requires prompts and encouragement.  Problem: Nutrition: Goal: Adequate nutrition will be maintained Outcome: Progressing Appetite improving.  Was able to drink 2 full Boosts today.  Twice as much as in the past couple days.  Problem: Physical Regulation: Goal: Complications related to the disease process or treatment will be avoided or minimized Outcome: Adequate for Discharge Pt remains on HD, as a result of her renal failure from sepsis.  Will be on dialysis for life, but has been evaluated by nephrology to be a good candidate for peritoneal dialysis in the future.

## 2017-04-30 NOTE — Progress Notes (Signed)
Nutrition Follow-up  DOCUMENTATION CODES:   Obesity unspecified  INTERVENTION:  Encouraged adequate intake of calories and protein at meals. Discussed sources of protein patient enjoys and encouraged her to order one at each meal.  Provide Boost Breeze po five times daily, each supplement provides 250 kcal and 9 grams of protein.   Provide snacks TID between meals. Will update snack orders in [employer].  NUTRITION DIAGNOSIS:   Inadequate oral intake related to poor appetite, other (see comment) (diarrhea) as evidenced by per patient/family report, meal completion < 25%.  Ongoing inadequate intake - addressing with liberalized diet, oral nutrition supplements, and snacks.  GOAL:   Patient will meet greater than or equal to 90% of their needs  Progressing.  MONITOR:   PO intake, Supplement acceptance, Labs, Weight trends, I & O's  REASON FOR ASSESSMENT:   Ventilator, Consult Enteral/tube feeding initiation and management  ASSESSMENT:   66 y.o. white female  with hypertension, hypothyroidism, atrial fibrillation, who was admitted to [facility] on 04/08/2017 for evaluation of increasing lower extremity edema. Admitted on 04/08/2017 with significant peripheral edema and cellulitis. Started on CRRT for hypotension and anuric renal failure on 7/2. Patient weaned off CRRT on 7/4. However later that day, patient had cardiac arrest and coded. Intubated, sedated and restarted on three vasopressors. Restart on CRRT on 7/4.   -Tunneled HD catheter was placed in right internal jugular vein on 7/19. -Pt has now transitioned to intermittent HD. Her dialysis schedule is M/W/F, so she will receive another HD treatment today. -NGT was removed on 7/19. She was advanced to soft diet morning of 7/19. That was then changed to 2 gram sodium diet that evening. On 7/21 diet was liberalized to regular.  Spoke with patient and her husband at bedside. She reports her appetite is improving along with her intake  of food. She reports eating 90% of her breakfast this morning. She ate approximately 75% of her dinner last night. She did not eat the soup that was delivered yesterday for lunch, but instead had 1/2 tomato sandwich (bread, tomato, mayonnaise) and 1/3 of a chocolate [employer] milkshake. She received 4 bottles of Boost Breeze yesterday and drank approximately 50% of each, so reports she had equivalent of 2 bottles of Boost Breeze. Patient was tired of the Ensure Enlive. Since she is only drinking approximately 50% of each bottle, she is amenable to increasing amount of time per day it is provided. Patient reports she is receiving Magic Cup between meals as snacks from kitchen. She is tired of these because they are too sweet. She would like to switch to other snacks. Her husband has also brought in cookies patient enjoys and peanut butter to eat them with.  Meal Completion: 50-90% In the past 24 hrs patient has had approximately 1489 kcal (87% minimum estimated kcal needs) and 64 grams of protein (69% minimum estimated protein needs).  Medications reviewed and include: amiodarone, carvedilol, famotidine, levothyroxine, Rena-vit QHS, 1/2NS @ 50 ml/hr, human albumin 25 grams every M/W/F during HD.  Labs reviewed: CBG 108-117 past 24 hrs, Chloride 98, BUN 74, Creatinine 3.01, Phosphorus 7.8.   I/O: 0 ml UOP, 2 occurrences unmeasured stool output past 24 hrs. UF 800 ml during HD on 7/18, UF 1500 ml HD on 7/20  Weight trend: 77.2 kg 7/23; wt continues to trend down with UF; -2.1 kg from last assessment on 7/19  Discussed with RN.  Diet Order:  Diet regular Room service appropriate? Yes; Fluid consistency: Thin  Skin:  Wound (see comment) (Blisters to BLE)  Last BM:  7/18  Height:   Ht Readings from Last 1 Encounters:  04/26/17 5\' 5"  (1.651 m)    Weight:   Wt Readings from Last 1 Encounters:  04/30/17 170 lb 3.1 oz (77.2 kg)    Ideal Body Weight:  56.81 kg  BMI:  Body mass index is 28.32  kg/m.  Estimated Nutritional Needs:   Kcal:  1610-9604 (MSJ x 1.3-1.5)  Protein:  93-116 grams (1.2-1.5 grams/kg)  Fluid:  UOP + 1 L  EDUCATION NEEDS:   Education needs no appropriate at this time  Willey Blade, MS, RD, LDN Pager: [phone] After Hours Pager: [phone]

## 2017-04-30 NOTE — Progress Notes (Signed)
[facility]  ROUNDING NOTE   Subjective:   Husband at bedside.  Off norepinephrine. Anuric.   Midodrine and IV albumin with HD treatment   Objective:  Vital signs in last 24 hours:  Temp:  [97.8 F (36.6 C)-98.9 F (37.2 C)] 98.8 F (37.1 C) (07/23 0400) Pulse Rate:  [85-119] 118 (07/23 0600) Resp:  [8-17] 14 (07/23 0600) BP: (73-106)/(55-82) 106/80 (07/23 0600) SpO2:  [89 %-99 %] 90 % (07/23 0600) Weight:  [77.2 kg (170 lb 3.1 oz)] 77.2 kg (170 lb 3.1 oz) (07/23 0600)  Weight change:  Filed Weights   04/26/17 1530 04/28/17 0412 04/30/17 0600  Weight: 78.9 kg (174 lb) 78.9 kg (173 lb 15.1 oz) 77.2 kg (170 lb 3.1 oz)    Intake/Output: I/O last 3 completed shifts: In: 799.8 [P.O.:494; I.V.:305.8] Out: 0    Intake/Output this shift:  No intake/output data recorded.  Physical Exam: General: Sitting in bed  Head: Normocephalic, atraumatic  Eyes: PERRL  Neck: supple  Lungs:  Right sided crackles  Heart: Irregular  Abdomen:  Soft, nontender, obese  Extremities: trace bilateral lower extremity edema   Neurologic: Awake, alert, following commands   Skin: Warm, dry  Access: Right IJ permcath 7/19 Dr. Lucky Cowboy    Basic Metabolic Panel:  Recent Labs Lab 04/24/17 1241 04/24/17 1452 04/24/17 2215 04/25/17 0441 04/25/17 1730 04/25/17 2335 04/26/17 0522 04/27/17 0415 04/27/17 1730 04/28/17 0507 04/30/17 0501  NA 134*  --  135 138 140  --  138 138  --  138 137  K 4.7  --  4.2 4.4 4.0  --  4.3 4.7  --  3.5 4.6  CL 100*  --  100* 101 100*  --  100* 100*  --  96* 98*  CO2 26  --  27 26 29   --  27 25  --  30 26  GLUCOSE 108*  --  112* 98 110*  --  129* 77  --  79 66  BUN 56*  --  51* 48* 24*  --  50* 76*  --  38* 74*  CREATININE 1.02*  --  0.96 1.03* 0.55  --  1.05* 1.96*  --  1.41* 3.01*  CALCIUM 8.4*  --  8.4* 8.7* 8.6*  --  8.2* 7.7*  --  7.8* 7.9*  MG 10.0* 3.0* 2.0 1.9 1.8  --   --   --   --   --   --   PHOS 2.4*  --  2.7 3.3 2.8 3.8  --  7.4* 4.5   --  7.8*    Liver Function Tests:  Recent Labs Lab 04/24/17 1241 04/24/17 2215 04/25/17 0441 04/25/17 1730 04/28/17 0507  AST  --   --  66*  --  51*  ALT  --   --  93*  --  59*  ALKPHOS  --   --  322*  --  235*  BILITOT  --   --  2.9*  --  2.4*  PROT  --   --  6.3*  --  5.8*  ALBUMIN 3.8 3.8 4.1  4.1 4.3 3.4*   No results for input(s): LIPASE, AMYLASE in the last 168 hours. No results for input(s): AMMONIA in the last 168 hours.  CBC:  Recent Labs Lab 04/25/17 0441 04/26/17 0742 04/27/17 0415 04/28/17 0507  WBC 8.6 10.0 8.4 8.4  NEUTROABS  --  7.8* 5.9  --   HGB 6.8* 8.6* 8.3* 8.4*  HCT 20.6* 25.6* 24.6*  24.8*  MCV 98.4 95.9 96.4 97.8  PLT 66* 103* 104* 120*    Cardiac Enzymes: No results for input(s): CKTOTAL, CKMB, CKMBINDEX, TROPONINI in the last 168 hours.  BNP: Invalid input(s): POCBNP  CBG:  Recent Labs Lab 04/29/17 0009 04/29/17 0739 04/29/17 1205 04/29/17 1612 04/29/17 2134  GLUCAP 107* 92 112* 117* 108*    Microbiology: Results for orders placed or performed during the hospital encounter of 04/08/17  Culture, blood (routine x 2)     Status: None   Collection Time: 04/08/17  2:04 AM  Result Value Ref Range Status   Specimen Description BLOOD RIGHT ANTECUBITAL  Final   Special Requests   Final    BOTTLES DRAWN AEROBIC AND ANAEROBIC Blood Culture adequate volume   Culture NO GROWTH 5 DAYS  Final   Report Status 04/13/2017 FINAL  Final  Culture, blood (routine x 2)     Status: None   Collection Time: 04/08/17  2:06 AM  Result Value Ref Range Status   Specimen Description BLOOD LEFT ANTECUBITAL  Final   Special Requests   Final    BOTTLES DRAWN AEROBIC AND ANAEROBIC Blood Culture adequate volume   Culture NO GROWTH 5 DAYS  Final   Report Status 04/13/2017 FINAL  Final  C difficile quick scan w PCR reflex     Status: None   Collection Time: 04/08/17  5:09 AM  Result Value Ref Range Status   C Diff antigen NEGATIVE NEGATIVE Final   C  Diff toxin NEGATIVE NEGATIVE Final   C Diff interpretation No C. difficile detected.  Final  MRSA PCR Screening     Status: None   Collection Time: 04/08/17  6:50 AM  Result Value Ref Range Status   MRSA by PCR NEGATIVE NEGATIVE Final    Comment:        The GeneXpert MRSA Assay (FDA approved for NASAL specimens only), is one component of a comprehensive MRSA colonization surveillance program. It is not intended to diagnose MRSA infection nor to guide or monitor treatment for MRSA infections.   Urine culture     Status: None   Collection Time: 04/08/17 12:16 PM  Result Value Ref Range Status   Specimen Description URINE, RANDOM  Final   Special Requests NONE  Final   Culture   Final    NO GROWTH Performed at [facility], [address] [place], [place] [address]    Report Status 04/10/2017 FINAL  Final  Aerobic Culture (superficial specimen)     Status: None   Collection Time: 04/09/17 11:32 AM  Result Value Ref Range Status   Specimen Description SKIN  Final   Special Requests NONE  Final   Gram Stain   Final    RARE WBC PRESENT, PREDOMINANTLY PMN NO SQUAMOUS EPITHELIAL CELLS SEEN NO ORGANISMS SEEN    Culture   Final    NO GROWTH 2 DAYS Performed at [facility], [place] [address] [place], [place] [address]    Report Status 04/11/2017 FINAL  Final  Gastrointestinal Panel by PCR , Stool     Status: None   Collection Time: 04/09/17  5:10 PM  Result Value Ref Range Status   Campylobacter species NOT DETECTED NOT DETECTED Final   Plesimonas shigelloides NOT DETECTED NOT DETECTED Final   Salmonella species NOT DETECTED NOT DETECTED Final   Yersinia enterocolitica NOT DETECTED NOT DETECTED Final   Vibrio species NOT DETECTED NOT DETECTED Final   Vibrio cholerae NOT DETECTED NOT  DETECTED Final   Enteroaggregative E coli (EAEC) NOT DETECTED NOT DETECTED Final   Enteropathogenic E coli (EPEC) NOT DETECTED NOT DETECTED Final   Enterotoxigenic E coli (ETEC) NOT  DETECTED NOT DETECTED Final   Shiga like toxin producing E coli (STEC) NOT DETECTED NOT DETECTED Final   Shigella/Enteroinvasive E coli (EIEC) NOT DETECTED NOT DETECTED Final   Cryptosporidium NOT DETECTED NOT DETECTED Final   Cyclospora cayetanensis NOT DETECTED NOT DETECTED Final   Entamoeba histolytica NOT DETECTED NOT DETECTED Final   Giardia lamblia NOT DETECTED NOT DETECTED Final   Adenovirus F40/41 NOT DETECTED NOT DETECTED Final   Astrovirus NOT DETECTED NOT DETECTED Final   Norovirus GI/GII NOT DETECTED NOT DETECTED Final   Rotavirus A NOT DETECTED NOT DETECTED Final   Sapovirus (I, II, IV, and V) NOT DETECTED NOT DETECTED Final  Culture, respiratory (NON-Expectorated)     Status: None   Collection Time: 04/12/17 12:00 PM  Result Value Ref Range Status   Specimen Description TRACHEAL ASPIRATE  Final   Special Requests NONE  Final   Gram Stain   Final    MODERATE WBC PRESENT,BOTH PMN AND MONONUCLEAR MODERATE YEAST Performed at [facility], [address], [place], [place] [address]    Culture MODERATE CANDIDA TROPICALIS  Final   Report Status 04/14/2017 FINAL  Final  Culture, respiratory (NON-Expectorated)     Status: None   Collection Time: 04/15/17  7:48 AM  Result Value Ref Range Status   Specimen Description TRACHEAL ASPIRATE  Final   Special Requests Normal  Final   Gram Stain   Final    RARE WBC PRESENT, PREDOMINANTLY PMN FEW BUDDING YEAST SEEN Performed at [facility], [place] [address] [place], [place] [address]    Culture ABUNDANT CANDIDA TROPICALIS  Final   Report Status 04/18/2017 FINAL  Final  CULTURE, BLOOD (ROUTINE X 2) w Reflex to ID Panel     Status: None   Collection Time: 04/15/17 10:46 AM  Result Value Ref Range Status   Specimen Description BLOOD LEFT SUBCLAVIAN TRIPLE LUMEN  Final   Special Requests   Final    BOTTLES DRAWN AEROBIC AND ANAEROBIC Blood Culture results may not be optimal due to an excessive volume of blood received in  culture bottles   Culture NO GROWTH 5 DAYS  Final   Report Status 04/20/2017 FINAL  Final  CULTURE, BLOOD (ROUTINE X 2) w Reflex to ID Panel     Status: None   Collection Time: 04/16/17  1:21 PM  Result Value Ref Range Status   Specimen Description BLOOD BLOOD LEFT HAND  Final   Special Requests   Final    BOTTLES DRAWN AEROBIC AND ANAEROBIC Blood Culture results may not be optimal due to an inadequate volume of blood received in culture bottles   Culture NO GROWTH 5 DAYS  Final   Report Status 04/21/2017 FINAL  Final  CULTURE, BLOOD (ROUTINE X 2) w Reflex to ID Panel     Status: None   Collection Time: 04/16/17  1:27 PM  Result Value Ref Range Status   Specimen Description BLOOD A-LINE DRAW  Final   Special Requests   Final    BOTTLES DRAWN AEROBIC AND ANAEROBIC Blood Culture adequate volume   Culture NO GROWTH 5 DAYS  Final   Report Status 04/21/2017 FINAL  Final  C difficile quick scan w PCR reflex     Status: None   Collection Time: 04/16/17 10:15 PM  Result  Value Ref Range Status   C Diff antigen NEGATIVE NEGATIVE Final   C Diff toxin NEGATIVE NEGATIVE Final   C Diff interpretation No C. difficile detected.  Final    Comment: VALID  Culture, blood (Routine X 2) w Reflex to ID Panel     Status: None (Preliminary result)   Collection Time: 04/26/17  7:30 AM  Result Value Ref Range Status   Specimen Description BLOOD PICC LINE  Final   Special Requests   Final    BOTTLES DRAWN AEROBIC AND ANAEROBIC Blood Culture adequate volume   Culture NO GROWTH 4 DAYS  Final   Report Status PENDING  Incomplete  Culture, blood (Routine X 2) w Reflex to ID Panel     Status: None (Preliminary result)   Collection Time: 04/26/17  7:30 AM  Result Value Ref Range Status   Specimen Description BLOOD RIGHT HAND  Final   Special Requests   Final    BOTTLES DRAWN AEROBIC AND ANAEROBIC Blood Culture adequate volume   Culture NO GROWTH 4 DAYS  Final   Report Status PENDING  Incomplete     Coagulation Studies: No results for input(s): LABPROT, INR in the last 72 hours.  Urinalysis: No results for input(s): COLORURINE, LABSPEC, PHURINE, GLUCOSEU, HGBUR, BILIRUBINUR, KETONESUR, PROTEINUR, UROBILINOGEN, NITRITE, LEUKOCYTESUR in the last 72 hours.  Invalid input(s): APPERANCEUR    Imaging: No results found.   Medications:   . sodium chloride    . albumin human    . norepinephrine (LEVOPHED) Adult infusion Stopped (04/29/17 1804)   . amiodarone  200 mg Per Tube BID  . carvedilol  3.125 mg Per Tube BID WC  . collagenase   Topical Daily  . famotidine  20 mg Per Tube QHS  . feeding supplement  1 Container Oral TID BM  . levothyroxine  100 mcg Per Tube QAC breakfast  . midodrine  10 mg Oral TID WC  . multivitamin  1 tablet Oral QHS  . QUEtiapine  25 mg Per Tube QHS  . silver sulfADIAZINE   Topical BID   acetaminophen, artificial tears, heparin, ipratropium-albuterol, LORazepam, metoprolol tartrate, morphine injection, [DISCONTINUED] ondansetron **OR** ondansetron (ZOFRAN) IV, phenol, sodium chloride flush, zinc oxide  Assessment/ Plan:  [name] is a 66 y.o. white female  with hypertension, hypothyroidism, atrial fibrillation, who was admitted to [facility] on 04/08/2017 for evaluation of increasing lower extremity edema.  Hospital course: Admitted on 04/08/2017 with significant peripheral edema and cellulitis. Started on CRRT for hypotension and anuric renal failure on 7/2. Patient weaned off CRRT on 7/4. However later that day, patient had cardiac arrest and coded. Intubated, sedated and restarted on three vasopressors. Restart on CRRT on 7/4.  2-D echo which shows EF 25-30% Now transitioned to intermittent hemodialysis  1. Acute renal failure with proteinuria and hematuria: baseline creatinine of 0.62 09/2014.  Patient with massive peripheral edema, anasarca and hypoalbuminemia.  Recent IV contrast exposure on 04/04/17.  - Serologic work up: compliments normal,  negative SPEP/UPEP, ANA negative, ANCA negative, GBM negative, negative hepatitis - Patient remains anuric. Hemodialysis for later today.   2. 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 Echocardiogram with EF of 25-30% - Continue midodrine to help sustain blood pressure.     3. Acute respiratory failure Extubated 7/9   4. 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  - Continue glucose control   LOS: 22 [name] 7/23/201810:09 AM

## 2017-04-30 NOTE — Progress Notes (Signed)
Pre hd assessment  

## 2017-04-30 NOTE — Plan of Care (Signed)
Problem: Physical Regulation: Goal: Ability to maintain clinical measurements within normal limits will improve Outcome: Progressing Patient remains alert and oriented. BP stable, A. Fib on the monitor rate controlled, oxygen saturation stable on RA. Patient was able to tolerate HD off the unit, awaiting floor bed to become available for transfer out of ICU.  Husband remains at the bedside and has been updated as appropriate.

## 2017-04-30 NOTE — Progress Notes (Signed)
Patient ID: [name], female   DOB: [birth year], 66 y.o.   MRN: [id number]     [facility] PROGRESS NOTE  Kailene Steinhart OVF:[id number] DOB: 1951-02-21 DOA: 04/08/2017 PCP: Tracie Harrier, MD  HPI/Subjective: Doing better, off the Levophed drip. Transferred out of ICU. Objective: Vitals:   04/30/17 1400 04/30/17 1430  BP: 90/67 120/67  Pulse: (!) 101 94  Resp: 11 16  Temp:      Filed Weights   04/28/17 0412 04/30/17 0600 04/30/17 1155  Weight: 78.9 kg (173 lb 15.1 oz) 77.2 kg (170 lb 3.1 oz) 77.2 kg (170 lb 3.1 oz)    ROS: Review of Systems  Constitutional: Negative for chills, fever and malaise/fatigue.  HENT: Negative for hearing loss.   Eyes: Negative for blurred vision, double vision and photophobia.  Respiratory: Negative for cough, hemoptysis and shortness of breath.   Cardiovascular: Negative for chest pain, palpitations, orthopnea and leg swelling.  Gastrointestinal: Negative for abdominal pain, diarrhea and vomiting.  Genitourinary: Negative for dysuria and urgency.  Musculoskeletal: Negative for myalgias and neck pain.  Skin: Negative for rash.  Neurological: Positive for weakness. Negative for dizziness, focal weakness, seizures and headaches.  Psychiatric/Behavioral: Negative for memory loss. The patient does not have insomnia.     Exam: Physical Exam  HENT:  Head: Atraumatic.  Nose: No mucosal edema.  Mouth/Throat: No oropharyngeal exudate or posterior oropharyngeal edema.  Eyes: Pupils are equal, round, and reactive to light. Lids are normal.  Icteric sclera  Neck: Carotid bruit is not present. No thyromegaly present.  Cardiovascular: Regular rhythm, S1 normal, S2 normal and normal heart sounds.   Respiratory: She has no decreased breath sounds. She has no wheezes. She has no rales.  GI: Soft. Bowel sounds are normal. There is no tenderness.  Musculoskeletal:       Right ankle: She exhibits swelling.       Left ankle: She exhibits swelling.   Lymphadenopathy:    She has no cervical adenopathy.  Neurological:  Able to wiggle toes bilateral feet. Able to squeeze hands.  Skin: Skin is warm. No cyanosis.  Left lower extremity covered today Right foot with some blisters that are healing Bruising right neck and left thigh  Psychiatric: Her affect is blunt.  Skin infection with cellulitis of left ankle.    Data Reviewed: Basic Metabolic Panel:  Recent Labs Lab 04/24/17 1241 04/24/17 1452 04/24/17 2215 04/25/17 0441 04/25/17 1730 04/25/17 2335 04/26/17 0522 04/27/17 0415 04/27/17 1730 04/28/17 0507 04/30/17 0501  NA 134*  --  135 138 140  --  138 138  --  138 137  K 4.7  --  4.2 4.4 4.0  --  4.3 4.7  --  3.5 4.6  CL 100*  --  100* 101 100*  --  100* 100*  --  96* 98*  CO2 26  --  27 26 29   --  27 25  --  30 26  GLUCOSE 108*  --  112* 98 110*  --  129* 77  --  79 66  BUN 56*  --  51* 48* 24*  --  50* 76*  --  38* 74*  CREATININE 1.02*  --  0.96 1.03* 0.55  --  1.05* 1.96*  --  1.41* 3.01*  CALCIUM 8.4*  --  8.4* 8.7* 8.6*  --  8.2* 7.7*  --  7.8* 7.9*  MG 10.0* 3.0* 2.0 1.9 1.8  --   --   --   --   --   --  PHOS 2.4*  --  2.7 3.3 2.8 3.8  --  7.4* 4.5  --  7.8*   Liver Function Tests: CBC:  Recent Labs Lab 04/25/17 0441 04/26/17 0742 04/27/17 0415 04/28/17 0507  WBC 8.6 10.0 8.4 8.4  NEUTROABS  --  7.8* 5.9  --   HGB 6.8* 8.6* 8.3* 8.4*  HCT 20.6* 25.6* 24.6* 24.8*  MCV 98.4 95.9 96.4 97.8  PLT 66* 103* 104* 120*   BNP (last 3 results)  Recent Labs  04/08/17 0139  BNP 2,012.0*     CBG:  Recent Labs Lab 04/29/17 0009 04/29/17 0739 04/29/17 1205 04/29/17 1612 04/29/17 2134  GLUCAP 107* 92 112* 117* 108*    Recent Results (from the past 240 hour(s))  Culture, blood (Routine X 2) w Reflex to ID Panel     Status: None (Preliminary result)   Collection Time: 04/26/17  7:30 AM  Result Value Ref Range Status   Specimen Description BLOOD PICC LINE  Final   Special Requests   Final     BOTTLES DRAWN AEROBIC AND ANAEROBIC Blood Culture adequate volume   Culture NO GROWTH 4 DAYS  Final   Report Status PENDING  Incomplete  Culture, blood (Routine X 2) w Reflex to ID Panel     Status: None (Preliminary result)   Collection Time: 04/26/17  7:30 AM  Result Value Ref Range Status   Specimen Description BLOOD RIGHT HAND  Final   Special Requests   Final    BOTTLES DRAWN AEROBIC AND ANAEROBIC Blood Culture adequate volume   Culture NO GROWTH 4 DAYS  Final   Report Status PENDING  Incomplete     Studies: No results found.  Scheduled Meds: . amiodarone  200 mg Per Tube BID  . carvedilol  3.125 mg Per Tube BID WC  . collagenase   Topical Daily  . famotidine  20 mg Per Tube QHS  . feeding supplement  1 Container Oral 5 X Daily  . levothyroxine  100 mcg Per Tube QAC breakfast  . midodrine  10 mg Oral TID WC  . midodrine  10 mg Oral Once  . multivitamin  1 tablet Oral QHS  . QUEtiapine  25 mg Per Tube QHS  . silver sulfADIAZINE   Topical BID   Continuous Infusions: . sodium chloride 50 mL/hr at 04/30/17 1100  . albumin human      Assessment/Plan:  1. Acute respiratory failure, cardiac arrest briefly, status post intubation, extubation, extubated on July 9. Breathing comfortably on room air. Patient had a PE and cardiac arrest, ACLS protocol followed that time . 2.  septic shock, cellulitis of the legs: Treated, still needing low-dose Levophed. Patient has no lower septic shock.  Recommend ultrasound is negative for DVT. Seen by ID.Patient is off the Levophed, blood pressure MAP around 60. Blood pressure 80/60/90/60 is normal for patient.  3. Shock liver secondary to septic shock, hypotension: Improving. Patient is tolerating diet. Discontinue NG tube. 4. Acute renal failure with proteinuria, hematuria: Baseline creatinine 0.62. Patient has massive peripheral edema, anasarca, hypoalbuminemia. Seen by nephrology, r. She was on CRRT before no one from hemodialysis sessions.  Patient has a Engineer, manufacturing. She gets albumin with hemodialysis 5. .Hypotension with atrial fibrillation: Patient still on Levophed, respiratory drink, EF 25-30% had cardiogenic shock. The patient is on Coreg 3.125 mg twice a day, amiodarone 200 mg by mouth twice a day #6 hypothyroidism: Continue Synthroid.  #Depression: Continue Seroquel.  # diabetes mellitus type 2: Continue monitoring closely,  patient started on D10 because of hypoglycemia but she is not had any hypoglycemia, she is doing better.  Dispo;LTACH when arrangements are made.  Code Status:     Code Status Orders        Start     Ordered   04/08/17 0651  Full code  Continuous     04/08/17 0650    Code Status History    Date Active Date Inactive Code Status Order ID Comments User Context   This patient has a current code status but no historical code status.     Family Communication: Spoke with Husband at bedside Disposition Plan: To be determined based on clinical course  Consultants:  Critical care specialist  Infectious disease  Nephrology  Palliative care  Cardiology  Time spent: 25 minutes.   [employer]

## 2017-04-30 NOTE — Progress Notes (Signed)
Hd start 

## 2017-04-30 NOTE — Progress Notes (Signed)
L CVC no longer indicated.  Pt laid flat and line removed and manual pressure held for 5 minutes by this RN.  No bleeding or hematoma at site.  Pt tolerated well.  R AC SL patent and available for IV medications.

## 2017-04-30 NOTE — Progress Notes (Signed)
  End of hd 

## 2017-04-30 NOTE — Care Management (Signed)
RNCM still pending results from patient's health insurance related to pre-cert on LTAC at [facility].

## 2017-05-01 LAB — CULTURE, BLOOD (ROUTINE X 2)
CULTURE: NO GROWTH
Culture: NO GROWTH
SPECIAL REQUESTS: ADEQUATE
SPECIAL REQUESTS: ADEQUATE

## 2017-05-01 LAB — CBC
HEMATOCRIT: 26.4 % — AB (ref 35.0–47.0)
Hemoglobin: 8.9 g/dL — ABNORMAL LOW (ref 12.0–16.0)
MCH: 32.8 pg (ref 26.0–34.0)
MCHC: 33.6 g/dL (ref 32.0–36.0)
MCV: 97.6 fL (ref 80.0–100.0)
PLATELETS: 127 10*3/uL — AB (ref 150–440)
RBC: 2.71 MIL/uL — ABNORMAL LOW (ref 3.80–5.20)
RDW: 24.2 % — AB (ref 11.5–14.5)
WBC: 8.8 10*3/uL (ref 3.6–11.0)

## 2017-05-01 LAB — BASIC METABOLIC PANEL
ANION GAP: 14 (ref 5–15)
BUN: 47 mg/dL — AB (ref 6–20)
CALCIUM: 8.4 mg/dL — AB (ref 8.9–10.3)
CO2: 27 mmol/L (ref 22–32)
Chloride: 96 mmol/L — ABNORMAL LOW (ref 101–111)
Creatinine, Ser: 2.09 mg/dL — ABNORMAL HIGH (ref 0.44–1.00)
GFR calc Af Amer: 27 mL/min — ABNORMAL LOW (ref >60)
GFR, EST NON AFRICAN AMERICAN: 24 mL/min — AB (ref >60)
GLUCOSE: 95 mg/dL (ref 65–99)
Potassium: 3.8 mmol/L (ref 3.5–5.1)
Sodium: 137 mmol/L (ref 135–145)

## 2017-05-01 LAB — GLUCOSE, CAPILLARY
Glucose-Capillary: 71 mg/dL (ref 65–99)
Glucose-Capillary: 116 mg/dL — ABNORMAL HIGH (ref 65–99)
Glucose-Capillary: 86 mg/dL (ref 65–99)

## 2017-05-01 MED ORDER — TUBERCULIN PPD 5 UNIT/0.1ML ID SOLN
5.0000 [IU] | Freq: Once | INTRADERMAL | Status: AC
  Administered 2017-05-01: 5 [IU] via INTRADERMAL
  Filled 2017-05-01: qty 0.1

## 2017-05-01 NOTE — Progress Notes (Signed)
SLP Note  Patient Details Name: Thomasene Dubow MRN: [id number] DOB: Sep 21, 1951   SLP Note:       Reason Eval/Treat Not Completed: Other (comment) (F/u re: toleration of diet. ) Reviewed chart and spoke w/nsg and pt. No reports of difficulty with regular diet w/thin liquids and pt's appetite continues to improve. Per reports pt has been tolerating regular diet well since starting on 7/19. No further ST indicated at this time. Will sign-off at this point, please re-consult if further concerns arise. Nsg and pt in agreement.    Madelia,Maaran 05/01/2017, 2:10 PM

## 2017-05-01 NOTE — Progress Notes (Signed)
Physical Therapy Treatment Patient Details Name: [name] MRN: [id number] DOB: [birth year] Today's Date: 05/01/2017    History of Present Illness 66yo female pt presented to ER on 7/1 secondary to LE swelling, difficulty walking; admitted with sepsis due to LE cellulitis.  Patient hospital course complicated by PEA arrest requiring ACLS with subsequent intubation (7/4-7/9), CRRT 7/2-7/18 with transition to HD (R IJ perm cath).    PT Comments    Pt eager to achieve mobility goals with support of PT during today's session. Pt able to perform sit to stand x3 with +2 max assist during this session; verbal and tactile cueing required for proper B UE and LE placement; HR elevated into 140 bpm (max spike to 153 bpm) but returned to baseline with sitting rest break; baseline at end of session. Pt requires +1 mod assist for bed mobility. Able to transfer to bedside chair with +2 scoot transfer. Pt denies pain beginning and end of session. Pt will continue to benefit from skilled PT to address strength, balance, and functional mobility deficits. Next session focus on continuing to progress sitting posture, OOB and functional mobility.   Follow Up Recommendations  CIR     Equipment Recommendations  Rolling walker with 5" wheels    Recommendations for Other Services       Precautions / Restrictions Precautions Precautions: Fall Precaution Comments: R IJ perm cath Restrictions Weight Bearing Restrictions: No    Mobility  Bed Mobility Overal bed mobility: Needs Assistance Bed Mobility: Supine to Sit     Supine to sit: Mod assist     General bed mobility comments: mod assist required for B LE and trunk management during supine to sit due to continued weakness noted  Transfers Overall transfer level: Needs assistance Equipment used: Rolling walker (2 wheeled) Transfers: Sit to/from Stand;Lateral/Scoot Transfers Sit to Stand: +2 physical assistance;From elevated surface;Max assist         Lateral/Scoot Transfers: Max assist;From elevated surface;+2 physical assistance General transfer comment: pt able to perform 3 sit to stands with +2 max assist and RW from an elevated surface due to significant weakness noted; vc's and tactile cues for proper B LE and B UE placement during sit to stands; scoot transfer performed with +2 max assist to bed side chair with verbal and tactile cues for proper B LE and UE placement   Ambulation/Gait             General Gait Details: unsafe/unable   Stairs            Wheelchair Mobility    Modified Rankin (Stroke Patients Only)       Balance Overall balance assessment: Needs assistance Sitting-balance support: No upper extremity supported;Feet supported Sitting balance-Leahy Scale: Fair Sitting balance - Comments: slightly forward flexed, sacral sitting; vc's for proper posture; no overt loss of balance noted   Standing balance support: Bilateral upper extremity supported Standing balance-Leahy Scale: Zero Standing balance comment: pt required +2 assit to maintain standing balance due to deconditioning/significant weakness                            Cognition Arousal/Alertness: Awake/alert Behavior During Therapy: WFL for tasks assessed/performed Overall Cognitive Status: Within Functional Limits for tasks assessed                                 General Comments: cognitive status improved since last  session, but continues to have mild difficulty with higher-level cognitive tasks      Exercises Other Exercises Other Exercises: unsupported sitting and tactile cueing for proper sitting posture to achieve active lumbar, thoracic, and cervical extension x10 Other Exercises: B UE propped on bedside table anterior to pt working on cervical, thoracic, and lumbar extension; pt also cued for quad activation in attempt to lift B hips off bed x10    General Comments General comments (skin integrity,  edema, etc.): mild swelling B LE, swelling R UE      Pertinent Vitals/Pain Pain Assessment: No/denies pain  HR - 108 bpm to 140 bpm (spiking at 153 bpm during activity; returned to baseline with sitting rest break; baseline end of session)    Home Living                      Prior Function            PT Goals (current goals can now be found in the care plan section) Acute Rehab PT Goals Patient Stated Goal: to get stronger PT Goal Formulation: With patient Time For Goal Achievement: 05/11/17 Potential to Achieve Goals: Good Progress towards PT goals: Progressing toward goals    Frequency    Min 2X/week      PT Plan Current plan remains appropriate    Co-evaluation              AM-PAC PT "6 Clicks" Daily Activity  Outcome Measure  Difficulty turning over in bed (including adjusting bedclothes, sheets and blankets)?: Total Difficulty moving from lying on back to sitting on the side of the bed? : Total Difficulty sitting down on and standing up from a chair with arms (e.g., wheelchair, bedside commode, etc,.)?: Total Help needed moving to and from a bed to chair (including a wheelchair)?: Total Help needed walking in hospital room?: Total Help needed climbing 3-5 steps with a railing? : Total 6 Click Score: 6    End of Session Equipment Utilized During Treatment: Gait belt Activity Tolerance: Patient limited by fatigue Patient left: in chair;with call bell/phone within reach;with nursing/sitter in room (nursing in room to transfer pt off floor in chair) Nurse Communication: Mobility status PT Visit Diagnosis: Muscle weakness (generalized) (M62.81);Difficulty in walking, not elsewhere classified (R26.2)     Time: 6283-1517 PT Time Calculation (min) (ACUTE ONLY): 41 min  Charges:                       G CodesLaqueta Carina, SPT 2017-05-31,3:13 PM [phone]

## 2017-05-01 NOTE — Progress Notes (Signed)
*     ASSESSMENT / PLAN: Acute hypoxic respiratory failure- resolving Pulmonary edema Severe dilated cardiomyopathy Chronic AF Status post PEA arrest 07/04  Cardiogenic shock, resolved Anuric AKI - likely cardiorenal plus ATN Anasarca, slowly improving Protein-calorie malnutrition Elevated LFTs due to shock liver, hepatic congestion - improving Diarrhea, resolved Anemia without evidence of acute blood loss Thrombocytopenia-improving  Elevated prothrombin time, resolved LE cellulitis - fully treated Severe deconditioning  Plan: Continue supplemental oxygen to maintain O2 sats >92% HD per Nephrology recommendations  Continue midodrine dose and frequency-07/19 Continue amiodarone, synthroid, and carvedilol  Continue physical therapy-goal OOB to Chair  DNR/DNI  Vascular surgery  permcath placed     Date: 05/01/2017  MRN# [id number] Alsha Meland [birth year]   Adrionna Delcid is a 66 y.o. old female seen in follow up for chief complaint of  Chief Complaint  Patient presents with  . Leg Swelling     HPI:   No new complaints today, feels well.   Medication:    Current Facility-Administered Medications:  .  acetaminophen (TYLENOL) tablet 650 mg, 650 mg, Oral, Q4H PRN, Varughese, Bincy S, NP, 650 mg at 04/26/17 0523 .  albumin human 25 % solution 25 g, 25 g, Intravenous, Q M,W,F-HD, Lateef, Munsoor, MD, 25 g at 04/30/17 1649 .  amiodarone (PACERONE) tablet 200 mg, 200 mg, Oral, BID, Epifanio Lesches, MD, 200 mg at 04/30/17 2127 .  artificial tears (LACRILUBE) ophthalmic ointment, , Both Eyes, Q4H PRN, Awilda Bill, NP .  carvedilol (COREG) tablet 3.125 mg, 3.125 mg, Oral, BID WC, Epifanio Lesches, MD, 3.125 mg at 04/30/17 1648 .  collagenase (SANTYL) ointment, , Topical, Daily, Epifanio Lesches, MD .  famotidine (PEPCID) tablet 20 mg, 20 mg, Oral, QHS, Epifanio Lesches, MD, 20 mg at 04/30/17 2126 .  feeding supplement (BOOST / RESOURCE BREEZE) liquid 1  Container, 1 Container, Oral, 5 X Daily, Epifanio Lesches, MD, 1 Container at 05/01/17 0602 .  heparin injection 1,000-6,000 Units, 1,000-6,000 Units, Intracatheter, PRN, Baxter Hire, MD, 1,000 Units at 04/22/17 1239 .  ipratropium-albuterol (DUONEB) 0.5-2.5 (3) MG/3ML nebulizer solution 3 mL, 3 mL, Nebulization, Q4H PRN, Wilhelmina Mcardle, MD .  levothyroxine (SYNTHROID, LEVOTHROID) tablet 100 mcg, 100 mcg, Oral, QAC breakfast, Epifanio Lesches, MD, 100 mcg at 05/01/17 0602 .  LORazepam (ATIVAN) injection 1 mg, 1 mg, Intravenous, Q4H PRN, Conforti, John, DO, 1 mg at 04/15/17 0742 .  metoprolol tartrate (LOPRESSOR) injection 2.5-5 mg, 2.5-5 mg, Intravenous, Q3H PRN, Wilhelmina Mcardle, MD, 5 mg at 04/24/17 0802 .  midodrine (PROAMATINE) tablet 10 mg, 10 mg, Oral, TID WC, Awilda Bill, NP, 10 mg at 04/30/17 1656 .  morphine 2 MG/ML injection 1-2 mg, 1-2 mg, Intravenous, Q4H PRN, Awilda Bill, NP, 1 mg at 04/22/17 2139 .  multivitamin (RENA-VIT) tablet 1 tablet, 1 tablet, Oral, QHS, Demetrios Loll, MD, 1 tablet at 04/30/17 2127 .  [DISCONTINUED] ondansetron (ZOFRAN) tablet 4 mg, 4 mg, Oral, Q6H PRN **OR** ondansetron (ZOFRAN) injection 4 mg, 4 mg, Intravenous, Q6H PRN, Harrie Foreman, MD .  phenol (CHLORASEPTIC) mouth spray 2 spray, 2 spray, Mouth/Throat, PRN, Awilda Bill, NP .  QUEtiapine (SEROQUEL) tablet 25 mg, 25 mg, Oral, QHS, Epifanio Lesches, MD, 25 mg at 04/30/17 2126 .  sodium chloride flush (NS) 0.9 % injection 10-40 mL, 10-40 mL, Intracatheter, PRN, Baxter Hire, MD, 10 mL at 04/30/17 2127 .  zinc oxide (BALMEX) 11.3 % cream, , Topical, QID PRN, Lenis Noon, [facility]  Allergies:  Patient has no known allergies.  Review of Systems: Gen:  Denies  fever, sweats. HEENT: Denies blurred vision. Cvc:  No dizziness, chest pain or heaviness Resp:   Denies cough or sputum porduction. Gi: Denies swallowing difficulty, stomach pain. constipation, bowel  incontinence Gu:  Denies bladder incontinence, burning urine Ext:   No Joint pain, stiffness. Skin: No skin rash, easy bruising. Endoc:  No polyuria, polydipsia. Psych: No depression, insomnia. Other:  All other systems were reviewed and found to be negative other than what is mentioned in the HPI.   Physical Examination:   VS: BP 104/72   Pulse 63   Temp 98.7 F (37.1 C) (Oral)   Resp 14   Ht 5\' 5"  (1.651 m)   Wt 170 lb 3.1 oz (77.2 kg)   SpO2 97%   BMI 28.32 kg/m    General Appearance: No distress  Neuro:without focal findings,  speech normal,  HEENT: PERRLA, EOM intact. Pulmonary: normal breath sounds, No wheezing.   CardiovascularNormal S1,S2.  No m/r/g.   Abdomen: Benign, Soft, non-tender. Renal:  No costovertebral tenderness  GU:  Not performed at this time. Endoc: No evident thyromegaly, no signs of acromegaly. Skin:   warm, no rash. Extremities: normal, no cyanosis, clubbing.   LABORATORY PANEL:   CBC  Recent Labs Lab 05/01/17 0613  WBC 8.8  HGB 8.9*  HCT 26.4*  PLT 127*   ------------------------------------------------------------------------------------------------------------------  Chemistries   Recent Labs Lab 04/25/17 1730  04/28/17 0507  05/01/17 0613  NA 140  < > 138  < > 137  K 4.0  < > 3.5  < > 3.8  CL 100*  < > 96*  < > 96*  CO2 29  < > 30  < > 27  GLUCOSE 110*  < > 79  < > 95  BUN 24*  < > 38*  < > 47*  CREATININE 0.55  < > 1.41*  < > 2.09*  CALCIUM 8.6*  < > 7.8*  < > 8.4*  MG 1.8  --   --   --   --   AST  --   --  51*  --   --   ALT  --   --  59*  --   --   ALKPHOS  --   --  235*  --   --   BILITOT  --   --  2.4*  --   --   < > = values in this interval not displayed. ------------------------------------------------------------------------------------------------------------------  Cardiac Enzymes No results for input(s): TROPONINI in the last 168  hours. ------------------------------------------------------------  RADIOLOGY:   No results found for this or any previous visit. No results found for this or any previous visit. ------------------------------------------------------------------------------------------------------------------  Thank  you for allowing [facility] to assist in the care of your patient. Our recommendations are noted above.  Please contact us if we can be of further service.   [name], MD.   Pulmonary and Critical Care Office Number: [phone]  [name], M.D.  Merton Border, M.D  05/01/2017

## 2017-05-01 NOTE — Progress Notes (Signed)
[facility]  ROUNDING NOTE   Subjective:   Husband at bedside.  Hemodialysis yesterday. Tolerated treatment well. UF of 2 litres.   Midodrine and IV albumin with HD treatment   Objective:  Vital signs in last 24 hours:  Temp:  [97.6 F (36.4 C)-98.9 F (37.2 C)] 98.9 F (37.2 C) (07/24 0800) Pulse Rate:  [40-125] 94 (07/24 0800) Resp:  [9-19] 13 (07/24 0800) BP: (71-120)/(53-83) 97/77 (07/24 0800) SpO2:  [95 %-100 %] 98 % (07/24 0800) Weight:  [77.2 kg (170 lb 3.1 oz)] 77.2 kg (170 lb 3.1 oz) (07/23 1155)  Weight change: 0 kg (0 lb) Filed Weights   04/28/17 0412 04/30/17 0600 04/30/17 1155  Weight: 78.9 kg (173 lb 15.1 oz) 77.2 kg (170 lb 3.1 oz) 77.2 kg (170 lb 3.1 oz)    Intake/Output: I/O last 3 completed shifts: In: 775.8 [P.O.:630; I.V.:45.8; IV Piggyback:100] Out: 2000 [Other:2000]   Intake/Output this shift:  No intake/output data recorded.  Physical Exam: General: Sitting in bed  Head: Normocephalic, atraumatic  Eyes: PERRL  Neck: supple  Lungs:  Right sided crackles  Heart: Irregular  Abdomen:  Soft, nontender, obese  Extremities: trace bilateral lower extremity edema   Neurologic: Awake, alert, following commands   Skin: Warm, dry  Access: Right IJ permcath 7/19 Dr. Lucky Cowboy    Basic Metabolic Panel:  Recent Labs Lab 04/24/17 1241 04/24/17 1452 04/24/17 2215 04/25/17 0441 04/25/17 1730 04/25/17 2335 04/26/17 0522 04/27/17 3662 04/27/17 1730 04/28/17 0507 04/30/17 0501 05/01/17 0613  NA 134*  --  135 138 140  --  138 138  --  138 137 137  K 4.7  --  4.2 4.4 4.0  --  4.3 4.7  --  3.5 4.6 3.8  CL 100*  --  100* 101 100*  --  100* 100*  --  96* 98* 96*  CO2 26  --  27 26 29   --  27 25  --  30 26 27   GLUCOSE 108*  --  112* 98 110*  --  129* 77  --  79 66 95  BUN 56*  --  51* 48* 24*  --  50* 76*  --  38* 74* 47*  CREATININE 1.02*  --  0.96 1.03* 0.55  --  1.05* 1.96*  --  1.41* 3.01* 2.09*  CALCIUM 8.4*  --  8.4* 8.7* 8.6*  --   8.2* 7.7*  --  7.8* 7.9* 8.4*  MG 10.0* 3.0* 2.0 1.9 1.8  --   --   --   --   --   --   --   PHOS 2.4*  --  2.7 3.3 2.8 3.8  --  7.4* 4.5  --  7.8*  --     Liver Function Tests:  Recent Labs Lab 04/24/17 1241 04/24/17 2215 04/25/17 0441 04/25/17 1730 04/28/17 0507  AST  --   --  66*  --  51*  ALT  --   --  93*  --  59*  ALKPHOS  --   --  322*  --  235*  BILITOT  --   --  2.9*  --  2.4*  PROT  --   --  6.3*  --  5.8*  ALBUMIN 3.8 3.8 4.1  4.1 4.3 3.4*   No results for input(s): LIPASE, AMYLASE in the last 168 hours. No results for input(s): AMMONIA in the last 168 hours.  CBC:  Recent Labs Lab 04/25/17 0441 04/26/17 9476 04/27/17 0415  04/28/17 0507 05/01/17 0613  WBC 8.6 10.0 8.4 8.4 8.8  NEUTROABS  --  7.8* 5.9  --   --   HGB 6.8* 8.6* 8.3* 8.4* 8.9*  HCT 20.6* 25.6* 24.6* 24.8* 26.4*  MCV 98.4 95.9 96.4 97.8 97.6  PLT 66* 103* 104* 120* 127*    Cardiac Enzymes: No results for input(s): CKTOTAL, CKMB, CKMBINDEX, TROPONINI in the last 168 hours.  BNP: Invalid input(s): POCBNP  CBG:  Recent Labs Lab 04/29/17 1612 04/29/17 2134 04/30/17 1648 04/30/17 1755 05/01/17 0834  GLUCAP 117* 108* 65 173* 71    Microbiology: Results for orders placed or performed during the hospital encounter of 04/08/17  Culture, blood (routine x 2)     Status: None   Collection Time: 04/08/17  2:04 AM  Result Value Ref Range Status   Specimen Description BLOOD RIGHT ANTECUBITAL  Final   Special Requests   Final    BOTTLES DRAWN AEROBIC AND ANAEROBIC Blood Culture adequate volume   Culture NO GROWTH 5 DAYS  Final   Report Status 04/13/2017 FINAL  Final  Culture, blood (routine x 2)     Status: None   Collection Time: 04/08/17  2:06 AM  Result Value Ref Range Status   Specimen Description BLOOD LEFT ANTECUBITAL  Final   Special Requests   Final    BOTTLES DRAWN AEROBIC AND ANAEROBIC Blood Culture adequate volume   Culture NO GROWTH 5 DAYS  Final   Report Status  04/13/2017 FINAL  Final  C difficile quick scan w PCR reflex     Status: None   Collection Time: 04/08/17  5:09 AM  Result Value Ref Range Status   C Diff antigen NEGATIVE NEGATIVE Final   C Diff toxin NEGATIVE NEGATIVE Final   C Diff interpretation No C. difficile detected.  Final  MRSA PCR Screening     Status: None   Collection Time: 04/08/17  6:50 AM  Result Value Ref Range Status   MRSA by PCR NEGATIVE NEGATIVE Final    Comment:        The GeneXpert MRSA Assay (FDA approved for NASAL specimens only), is one component of a comprehensive MRSA colonization surveillance program. It is not intended to diagnose MRSA infection nor to guide or monitor treatment for MRSA infections.   Urine culture     Status: None   Collection Time: 04/08/17 12:16 PM  Result Value Ref Range Status   Specimen Description URINE, RANDOM  Final   Special Requests NONE  Final   Culture   Final    NO GROWTH Performed at [facility], [address], [place], [place] [address]    Report Status 04/10/2017 FINAL  Final  Aerobic Culture (superficial specimen)     Status: None   Collection Time: 04/09/17 11:32 AM  Result Value Ref Range Status   Specimen Description SKIN  Final   Special Requests NONE  Final   Gram Stain   Final    RARE WBC PRESENT, PREDOMINANTLY PMN NO SQUAMOUS EPITHELIAL CELLS SEEN NO ORGANISMS SEEN    Culture   Final    NO GROWTH 2 DAYS Performed at [facility], [place] [address] [place], [place] [address]    Report Status 04/11/2017 FINAL  Final  Gastrointestinal Panel by PCR , Stool     Status: None   Collection Time: 04/09/17  5:10 PM  Result Value Ref Range Status   Campylobacter species NOT DETECTED NOT DETECTED Final   Plesimonas shigelloides  NOT DETECTED NOT DETECTED Final   Salmonella species NOT DETECTED NOT DETECTED Final   Yersinia enterocolitica NOT DETECTED NOT DETECTED Final   Vibrio species NOT DETECTED NOT DETECTED Final   Vibrio cholerae NOT  DETECTED NOT DETECTED Final   Enteroaggregative E coli (EAEC) NOT DETECTED NOT DETECTED Final   Enteropathogenic E coli (EPEC) NOT DETECTED NOT DETECTED Final   Enterotoxigenic E coli (ETEC) NOT DETECTED NOT DETECTED Final   Shiga like toxin producing E coli (STEC) NOT DETECTED NOT DETECTED Final   Shigella/Enteroinvasive E coli (EIEC) NOT DETECTED NOT DETECTED Final   Cryptosporidium NOT DETECTED NOT DETECTED Final   Cyclospora cayetanensis NOT DETECTED NOT DETECTED Final   Entamoeba histolytica NOT DETECTED NOT DETECTED Final   Giardia lamblia NOT DETECTED NOT DETECTED Final   Adenovirus F40/41 NOT DETECTED NOT DETECTED Final   Astrovirus NOT DETECTED NOT DETECTED Final   Norovirus GI/GII NOT DETECTED NOT DETECTED Final   Rotavirus A NOT DETECTED NOT DETECTED Final   Sapovirus (I, II, IV, and V) NOT DETECTED NOT DETECTED Final  Culture, respiratory (NON-Expectorated)     Status: None   Collection Time: 04/12/17 12:00 PM  Result Value Ref Range Status   Specimen Description TRACHEAL ASPIRATE  Final   Special Requests NONE  Final   Gram Stain   Final    MODERATE WBC PRESENT,BOTH PMN AND MONONUCLEAR MODERATE YEAST Performed at [facility], [address] [place], [place] [address]    Culture MODERATE CANDIDA TROPICALIS  Final   Report Status 04/14/2017 FINAL  Final  Culture, respiratory (NON-Expectorated)     Status: None   Collection Time: 04/15/17  7:48 AM  Result Value Ref Range Status   Specimen Description TRACHEAL ASPIRATE  Final   Special Requests Normal  Final   Gram Stain   Final    RARE WBC PRESENT, PREDOMINANTLY PMN FEW BUDDING YEAST SEEN Performed at [facility], [place] [address] [place], [place] [address]    Culture ABUNDANT CANDIDA TROPICALIS  Final   Report Status 04/18/2017 FINAL  Final  CULTURE, BLOOD (ROUTINE X 2) w Reflex to ID Panel     Status: None   Collection Time: 04/15/17 10:46 AM  Result Value Ref Range Status   Specimen Description  BLOOD LEFT SUBCLAVIAN TRIPLE LUMEN  Final   Special Requests   Final    BOTTLES DRAWN AEROBIC AND ANAEROBIC Blood Culture results may not be optimal due to an excessive volume of blood received in culture bottles   Culture NO GROWTH 5 DAYS  Final   Report Status 04/20/2017 FINAL  Final  CULTURE, BLOOD (ROUTINE X 2) w Reflex to ID Panel     Status: None   Collection Time: 04/16/17  1:21 PM  Result Value Ref Range Status   Specimen Description BLOOD BLOOD LEFT HAND  Final   Special Requests   Final    BOTTLES DRAWN AEROBIC AND ANAEROBIC Blood Culture results may not be optimal due to an inadequate volume of blood received in culture bottles   Culture NO GROWTH 5 DAYS  Final   Report Status 04/21/2017 FINAL  Final  CULTURE, BLOOD (ROUTINE X 2) w Reflex to ID Panel     Status: None   Collection Time: 04/16/17  1:27 PM  Result Value Ref Range Status   Specimen Description BLOOD A-LINE DRAW  Final   Special Requests   Final    BOTTLES DRAWN AEROBIC AND ANAEROBIC Blood Culture adequate volume  Culture NO GROWTH 5 DAYS  Final   Report Status 04/21/2017 FINAL  Final  C difficile quick scan w PCR reflex     Status: None   Collection Time: 04/16/17 10:15 PM  Result Value Ref Range Status   C Diff antigen NEGATIVE NEGATIVE Final   C Diff toxin NEGATIVE NEGATIVE Final   C Diff interpretation No C. difficile detected.  Final    Comment: VALID  Culture, blood (Routine X 2) w Reflex to ID Panel     Status: None   Collection Time: 04/26/17  7:30 AM  Result Value Ref Range Status   Specimen Description BLOOD PICC LINE  Final   Special Requests   Final    BOTTLES DRAWN AEROBIC AND ANAEROBIC Blood Culture adequate volume   Culture NO GROWTH 5 DAYS  Final   Report Status 05/01/2017 FINAL  Final  Culture, blood (Routine X 2) w Reflex to ID Panel     Status: None   Collection Time: 04/26/17  7:30 AM  Result Value Ref Range Status   Specimen Description BLOOD RIGHT HAND  Final   Special Requests    Final    BOTTLES DRAWN AEROBIC AND ANAEROBIC Blood Culture adequate volume   Culture NO GROWTH 5 DAYS  Final   Report Status 05/01/2017 FINAL  Final    Coagulation Studies: No results for input(s): LABPROT, INR in the last 72 hours.  Urinalysis: No results for input(s): COLORURINE, LABSPEC, PHURINE, GLUCOSEU, HGBUR, BILIRUBINUR, KETONESUR, PROTEINUR, UROBILINOGEN, NITRITE, LEUKOCYTESUR in the last 72 hours.  Invalid input(s): APPERANCEUR    Imaging: No results found.   Medications:   . albumin human     . amiodarone  200 mg Oral BID  . carvedilol  3.125 mg Oral BID WC  . collagenase   Topical Daily  . famotidine  20 mg Oral QHS  . feeding supplement  1 Container Oral 5 X Daily  . levothyroxine  100 mcg Oral QAC breakfast  . midodrine  10 mg Oral TID WC  . multivitamin  1 tablet Oral QHS  . QUEtiapine  25 mg Oral QHS   acetaminophen, artificial tears, heparin, ipratropium-albuterol, LORazepam, metoprolol tartrate, morphine injection, [DISCONTINUED] ondansetron **OR** ondansetron (ZOFRAN) IV, phenol, sodium chloride flush, zinc oxide  Assessment/ Plan:  [name] is a 66 y.o. white female  with hypertension, hypothyroidism, atrial fibrillation, who was admitted to [facility] on 04/08/2017 for evaluation of increasing lower extremity edema.  Hospital course: Admitted on 04/08/2017 with significant peripheral edema and cellulitis. Started on CRRT for hypotension and anuric renal failure on 7/2. Patient weaned off CRRT on 7/4. However later that day, patient had cardiac arrest and coded. Intubated, sedated and restarted on three vasopressors. Restart on CRRT on 7/4.  2-D echo which shows EF 25-30% Now transitioned to intermittent hemodialysis MWF  1. Acute renal failure with proteinuria and hematuria: baseline creatinine of 0.62 09/2014.  Patient with massive peripheral edema, anasarca and hypoalbuminemia.  Recent IV contrast exposure on 04/04/17.  - Serologic work up: compliments  normal, negative SPEP/UPEP, ANA negative, ANCA negative, GBM negative, negative hepatitis - Patient remains anuric. Hemodialysis MWF schedule. RIJ permcath.   2. 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 Echocardiogram with EF of 25-30% - Continue midodrine to help sustain blood pressure.  - IV albumin with HD treatment    3. Acute respiratory failure Extubated 7/9  Currently breathing room air  4. 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  - Continue glucose control   LOS: [place], [place] 7/24/20189:29 AM

## 2017-05-01 NOTE — Care Management (Addendum)
Patient health insurance has denied LTAC authorization. Per ICU provider, at this time, patient appears to be more appropriate for SNF or inpatient rehab. OT/PT evaluation pending. RNCM will continue to follow. Husband updated.

## 2017-05-01 NOTE — Progress Notes (Signed)
To whom it mat concern,  Mrs. [name] is unable to fulfill her [name] duty obligations due to the fact that she has been hospitalized for acute medical conditions. She is currently in the ICU.    Corrin Parker, M.D.  [facility]  Medical Director [place] Director [facility] Cardio-Pulmonary Department

## 2017-05-01 NOTE — Progress Notes (Signed)
Patient ID: [name], female   DOB: [birth year], 66 y.o.   MRN: [id number]     [employer] PROGRESS NOTE  [name] QVZ:[id number] DOB: 06-Sep-1951 DOA: 04/08/2017 PCP: Tracie Harrier, MD  HPI/Subjective: Patient has some cough but no shortness of breath.  Objective: Vitals:   05/01/17 1000 05/01/17 1439  BP: 118/77 (!) 92/54  Pulse: (!) 105 (!) 115  Resp: 13 (!) 22  Temp:  98.6 F (37 C)    Filed Weights   04/28/17 0412 04/30/17 0600 04/30/17 1155  Weight: 78.9 kg (173 lb 15.1 oz) 77.2 kg (170 lb 3.1 oz) 77.2 kg (170 lb 3.1 oz)    ROS: Review of Systems  Constitutional: Negative for chills, fever and malaise/fatigue.  HENT: Negative for hearing loss.   Eyes: Negative for blurred vision, double vision and photophobia.  Respiratory: Negative for cough, hemoptysis and shortness of breath.   Cardiovascular: Negative for chest pain, palpitations, orthopnea and leg swelling.  Gastrointestinal: Negative for abdominal pain, diarrhea and vomiting.  Genitourinary: Negative for dysuria and urgency.  Musculoskeletal: Negative for myalgias and neck pain.  Skin: Negative for rash.  Neurological: Positive for weakness. Negative for dizziness, focal weakness, seizures and headaches.  Psychiatric/Behavioral: Negative for memory loss. The patient does not have insomnia.     Exam: Physical Exam  HENT:  Head: Atraumatic.  Nose: No mucosal edema.  Mouth/Throat: No oropharyngeal exudate or posterior oropharyngeal edema.  Eyes: Pupils are equal, round, and reactive to light. Lids are normal.  Icteric sclera  Neck: Carotid bruit is not present. No thyromegaly present.  Cardiovascular: Regular rhythm, S1 normal, S2 normal and normal heart sounds.   Respiratory: She has no decreased breath sounds. She has no wheezes. She has no rales.  GI: Soft. Bowel sounds are normal. There is no tenderness.  Musculoskeletal:       Right ankle: She exhibits swelling.       Left ankle: She  exhibits swelling.  Lymphadenopathy:    She has no cervical adenopathy.  Neurological:  Able to wiggle toes bilateral feet. Able to squeeze hands.  Skin: Skin is warm. No cyanosis.  Left lower extremity covered today Right foot with some blisters that are healing Bruising right neck and left thigh  Psychiatric: Her affect is blunt.  Skin infection with cellulitis of left ankle.    Data Reviewed: Basic Metabolic Panel:  Recent Labs Lab 04/24/17 2215 04/25/17 0441 04/25/17 1730 04/25/17 2335 04/26/17 0522 04/27/17 0415 04/27/17 1730 04/28/17 0507 04/30/17 0501 05/01/17 0613  NA 135 138 140  --  138 138  --  138 137 137  K 4.2 4.4 4.0  --  4.3 4.7  --  3.5 4.6 3.8  CL 100* 101 100*  --  100* 100*  --  96* 98* 96*  CO2 27 26 29   --  27 25  --  30 26 27   GLUCOSE 112* 98 110*  --  129* 77  --  79 66 95  BUN 51* 48* 24*  --  50* 76*  --  38* 74* 47*  CREATININE 0.96 1.03* 0.55  --  1.05* 1.96*  --  1.41* 3.01* 2.09*  CALCIUM 8.4* 8.7* 8.6*  --  8.2* 7.7*  --  7.8* 7.9* 8.4*  MG 2.0 1.9 1.8  --   --   --   --   --   --   --   PHOS 2.7 3.3 2.8 3.8  --  7.4* 4.5  --  7.8*  --    Liver Function Tests: CBC:  Recent Labs Lab 04/25/17 0441 04/26/17 0742 04/27/17 0415 04/28/17 0507 05/01/17 0613  WBC 8.6 10.0 8.4 8.4 8.8  NEUTROABS  --  7.8* 5.9  --   --   HGB 6.8* 8.6* 8.3* 8.4* 8.9*  HCT 20.6* 25.6* 24.6* 24.8* 26.4*  MCV 98.4 95.9 96.4 97.8 97.6  PLT 66* 103* 104* 120* 127*   BNP (last 3 results)  Recent Labs  04/08/17 0139  BNP 2,012.0*     CBG:  Recent Labs Lab 04/29/17 2134 04/30/17 1648 04/30/17 1755 05/01/17 0834 05/01/17 1142  GLUCAP 108* 65 173* 71 116*    Recent Results (from the past 240 hour(s))  Culture, blood (Routine X 2) w Reflex to ID Panel     Status: None   Collection Time: 04/26/17  7:30 AM  Result Value Ref Range Status   Specimen Description BLOOD PICC LINE  Final   Special Requests   Final    BOTTLES DRAWN AEROBIC AND  ANAEROBIC Blood Culture adequate volume   Culture NO GROWTH 5 DAYS  Final   Report Status 05/01/2017 FINAL  Final  Culture, blood (Routine X 2) w Reflex to ID Panel     Status: None   Collection Time: 04/26/17  7:30 AM  Result Value Ref Range Status   Specimen Description BLOOD RIGHT HAND  Final   Special Requests   Final    BOTTLES DRAWN AEROBIC AND ANAEROBIC Blood Culture adequate volume   Culture NO GROWTH 5 DAYS  Final   Report Status 05/01/2017 FINAL  Final     Studies: No results found.  Scheduled Meds: . amiodarone  200 mg Oral BID  . carvedilol  3.125 mg Oral BID WC  . collagenase   Topical Daily  . famotidine  20 mg Oral QHS  . feeding supplement  1 Container Oral 5 X Daily  . levothyroxine  100 mcg Oral QAC breakfast  . midodrine  10 mg Oral TID WC  . multivitamin  1 tablet Oral QHS  . QUEtiapine  25 mg Oral QHS   Continuous Infusions: . albumin human      Assessment/Plan:  1. Acute respiratory failure, cardiac arrest briefly, status post intubation, extubation, extubated on July 9. Breathing comfortably on room air. Patient had a PE and cardiac arrest, ACLS protocol followed that time .Patient resting comfortably now, on room air.   septic shock, cellulitis of the legs: Treated, off the Levophed, transfer to floor Levophed.  .  Recommend ultrasound is negative for DVT. Seen by ID.Patient is off the Levophed, blood pressure MAP around 60.   2. Shock liver secondary to septic shock, hypotension: Improving. Patient is tolerating diet. Discontinue NG tube. 3. Acute renal failure with proteinuria, hematuria: Baseline creatinine 0.62. Patient has massive peripheral edema, anasarca, hypoalbuminemia. Seen by nephrology, r. She was on CRRT before no one from hemodialysis sessions. Patient has a Engineer, manufacturing. She gets albumin with hemodialysis 4.  5. .Hypotension with atrial fibrillation: Patient still on Levophed, respiratory drink, EF 25-30% had cardiogenic shock. The patient  is on Coreg 3.125 mg twice a day, amiodarone 200 mg by mouth twice a day #6 hypothyroidism: Continue Synthroid.  #Depression: Continue Seroquel.  # diabetes mellitus type 2: Continue monitoring closely, patient started on D10 because of hypoglycemia but she is not had any hypoglycemia, she is doing better.  Dispo;LTACH when arrangements are made.  Code Status:     Code Status Orders  Start     Ordered   04/08/17 0651  Full code  Continuous     04/08/17 0650    Code Status History    Date Active Date Inactive Code Status Order ID Comments User Context   This patient has a current code status but no historical code status.     Family Communication: Spoke with Husband at bedside Disposition Plan: To be determined based on clinical course  Consultants:  Critical care specialist  Infectious disease  Nephrology  Palliative care  Cardiology  Time spent: 25 minutes.   [employer]

## 2017-05-01 NOTE — Progress Notes (Addendum)
Inpatient Rehabilitation  Per PT request, patient was screened by Gunnar Fusi for appropriateness for an Inpatient Acute Rehab consult.  At this time I await full note with details as well as updated OT note and recommendations.  Plan to follow up tomorrow.  Please call with questions.   Carmelia Roller., CCC/SLP Admission Coordinator  [place]  Cell [phone]

## 2017-05-01 NOTE — Progress Notes (Signed)
Chaplain made a follow up visit with pt. The therapist were in the room working with pt, but no family member was available at the time. [place] could not complete a visit with pt at the time but have plans a follow up with pt as needed.   05/01/17 1400  Clinical Encounter Type  Visited With Patient;Health care provider  Visit Type Follow-up;Spiritual support  Referral From [place];Other (Comment)

## 2017-05-01 NOTE — Progress Notes (Signed)
[facility] for Electrolyte Replacement    Pharmacy consulted for electrolyte management for 66 yo female previously requiring CRRT. Patient off CRRT and converted to intermittent hemodialysis.   Plan:  Electrolytes WNL and stable. No replacement needed. Will plan to recheck with AM labs on 7/26.  No Known Allergies  Patient Measurements: Height: 5\' 5"  (165.1 cm) Weight: 170 lb 3.1 oz (77.2 kg) IBW/kg (Calculated) : 57 Vital Signs: Temp: 98.9 F (37.2 C) (07/24 0800) Temp Source: Oral (07/24 0800) BP: 97/77 (07/24 0800) Pulse Rate: 94 (07/24 0800) Intake/Output from previous day: 07/23 0701 - 07/24 0700 In: 625.8 [P.O.:480; I.V.:45.8; IV Piggyback:100] Out: 2000  Intake/Output from this shift: No intake/output data recorded.  Labs:  Recent Labs  04/30/17 0501 05/01/17 0613  WBC  --  8.8  HGB  --  8.9*  HCT  --  26.4*  PLT  --  127*  CREATININE 3.01* 2.09*  PHOS 7.8*  --     Lab Results  Component Value Date   K 3.8 05/01/2017    Estimated Creatinine Clearance: 27.6 mL/min (A) (by C-G formula based on SCr of 2.09 mg/dL (H)).   Potassium (mmol/L)  Date Value  05/01/2017 3.8  09/19/2014 4.1   Calcium (mg/dL)  Date Value  05/01/2017 8.4 (L)   Calcium, Total (mg/dL)  Date Value  09/19/2014 8.3 (L)   Sodium (mmol/L)  Date Value  05/01/2017 137  09/19/2014 140    Pharmacy will continue to monitor and adjust per consult.   Pernell Dupre, PharmD, BCPS Clinical Pharmacist 05/01/2017 11:08 AM

## 2017-05-02 ENCOUNTER — Encounter
Admission: EM | Disposition: A | Discharge status: Rehabilitation Facility | Payer: Self-pay | Source: Home / Self Care | Attending: Internal Medicine

## 2017-05-02 LAB — RENAL FUNCTION PANEL
ALBUMIN: 3.6 g/dL (ref 3.5–5.0)
Anion gap: 14 (ref 5–15)
BUN: 72 mg/dL — AB (ref 6–20)
CALCIUM: 8.7 mg/dL — AB (ref 8.9–10.3)
CO2: 28 mmol/L (ref 22–32)
CREATININE: 3.08 mg/dL — AB (ref 0.44–1.00)
Chloride: 98 mmol/L — ABNORMAL LOW (ref 101–111)
GFR calc Af Amer: 17 mL/min — ABNORMAL LOW (ref >60)
GFR calc non Af Amer: 15 mL/min — ABNORMAL LOW (ref >60)
GLUCOSE: 74 mg/dL (ref 65–99)
PHOSPHORUS: 6.9 mg/dL — AB (ref 2.5–4.6)
Potassium: 4.6 mmol/L (ref 3.5–5.1)
SODIUM: 140 mmol/L (ref 135–145)

## 2017-05-02 LAB — GLUCOSE, CAPILLARY
Glucose-Capillary: 68 mg/dL (ref 65–99)
Glucose-Capillary: 82 mg/dL (ref 65–99)
Glucose-Capillary: 92 mg/dL (ref 65–99)

## 2017-05-02 LAB — CBC
HCT: 26.8 % — ABNORMAL LOW (ref 35.0–47.0)
Hemoglobin: 8.9 g/dL — ABNORMAL LOW (ref 12.0–16.0)
MCH: 32.4 pg (ref 26.0–34.0)
MCHC: 33.1 g/dL (ref 32.0–36.0)
MCV: 97.8 fL (ref 80.0–100.0)
PLATELETS: 140 10*3/uL — AB (ref 150–440)
RBC: 2.74 MIL/uL — ABNORMAL LOW (ref 3.80–5.20)
RDW: 23.7 % — AB (ref 11.5–14.5)
WBC: 10.7 10*3/uL (ref 3.6–11.0)

## 2017-05-02 SURGERY — DIALYSIS/PERMA CATHETER INSERTION
Anesthesia: Moderate Sedation

## 2017-05-02 MED ORDER — SERTRALINE HCL 50 MG PO TABS
25.0000 mg | ORAL_TABLET | Freq: Every day | ORAL | Status: DC
  Administered 2017-05-02 – 2017-05-06 (×5): 25 mg via ORAL
  Filled 2017-05-02 (×6): qty 1

## 2017-05-02 NOTE — Progress Notes (Signed)
Pre-Dialysis assessment. 

## 2017-05-02 NOTE — Progress Notes (Signed)
Hypoglycemic Event  CBG: 68 at 0738 05/02/2017  Treatment: 1/2 cup of sprite given at 0745  Follow-up CBG: Time: 0803 05/02/2017 CBG Result: 82 at 05/02/2017  No further treatment needed.    [name] [employer]

## 2017-05-02 NOTE — Progress Notes (Signed)
Post hd vitals 

## 2017-05-02 NOTE — Care Management (Signed)
Late note entry 05/01/17- Note/excuse from Dr. Mortimer Fries to excuse from jury duty delivered to patient's husband; copy placed in patient's chart.

## 2017-05-02 NOTE — Progress Notes (Signed)
Occupational Therapy Treatment Patient Details Name: [name] MRN: [id number] DOB: Dec 05, 1950 Today's Date: 05/02/2017    History of present illness 66yo female pt presented to ER on 7/1 secondary to LE swelling, difficulty walking; admitted with sepsis due to LE cellulitis.  Patient hospital course complicated by PEA arrest requiring ACLS with subsequent intubation (7/4-7/9), CRRT 7/2-7/18 with transition to HD (R IJ perm cath).   OT comments  Pt seen for OT treatment this date. Pt continues to be profoundly weak, although strength improving, particularly in BUE. Pt required mod-max assist to perform supine to sit EOB for grooming tasks. Pt tolerated sitting EOB for approx 12 minutes to wash her face, brush teeth, and apply moisturizer to her lips. Set up required, with pt demonstrating improvement in quality of movement bringing her hand to her face for grooming tasks. HR up to 127 with bed mobility, improving with verbal cues for pursed lip breathing. Pt endorses feeling anxious and worrying about current situation. Pt encouraged however with progress thus far. Verbal/tactile/visual cues required to support proper posture, with pt unable to maintain, reverting to forward flexed posture with sacral sitting. Pt quite fatigued, requiring max assist to total assist for sit>supine. Pt able to assist very minimally with BUE pulling on bed rails to scoot up in bed. Based on pt's PLOF and current functional status, changing recommendation to CIR, as pt would benefit greatly from in-patient rehabilitation in order to maximize return to PLOF. Increased frequency. Will continue to progress pt towards goals.     Follow Up Recommendations  CIR    Equipment Recommendations  3 in 1 bedside commode    Recommendations for Other Services Rehab consult    Precautions / Restrictions Precautions Precautions: Fall Precaution Comments: R IJ perm cath Restrictions Weight Bearing Restrictions: No        Mobility Bed Mobility Overal bed mobility: Needs Assistance Bed Mobility: Supine to Sit;Sit to Supine     Supine to sit: Mod assist;Max assist Sit to supine: Max assist;Total assist   General bed mobility comments: mod-max assist for BLE and trunk mgt for sup>sit EOB, max assist to total assist x1 required for sit>sup due to fatigue and weakness. Pt had hemodialysis earlier this date as well.   Transfers                 General transfer comment: deferred due to fatigue    Balance Overall balance assessment: Needs assistance Sitting-balance support: Feet supported;Single extremity supported Sitting balance-Leahy Scale: Fair Sitting balance - Comments: verbal/tactile and visual cues for proper posture, no LOB noted, but pt required single UE support at times.                                   ADL either performed or assessed with clinical judgement   ADL Overall ADL's : Needs assistance/impaired Eating/Feeding: Bed level;Set up   Grooming: Wash/dry hands;Wash/dry face;Oral care;Set up;Supervision/safety;Sitting Grooming Details (indicate cue type and reason): Pt tolerated sitting EOB for approx 12 minutes to wash her face, brush teeth, and apply moisturizer to her lips. Set up required, with pt demonstrating improvement in quality of movement bringing her hand to her face.                                      Vision Baseline Vision/History: Wears glasses Wears  Glasses: At all times Patient Visual Report: No change from baseline     Perception     Praxis      Cognition Arousal/Alertness: Awake/alert Behavior During Therapy: WFL for tasks assessed/performed Overall Cognitive Status: Within Functional Limits for tasks assessed                                          Exercises Other Exercises Other Exercises: pt educated in BUE ROM/strengthening exercises to perform at bed level in between therapy sessions to  support increased strength, pt verbalized plan to perform   Shoulder Instructions       General Comments mild BLE swelling persists    Pertinent Vitals/ Pain       Pain Assessment: No/denies pain  Home Living                                          Prior Functioning/Environment              Frequency  Min 3X/week        Progress Toward Goals  OT Goals(current goals can now be found in the care plan section)  Progress towards OT goals: Progressing toward goals  Acute Rehab OT Goals Patient Stated Goal: to get stronger OT Goal Formulation: With patient/family Time For Goal Achievement: 05/12/17 Potential to Achieve Goals: Good  Plan Frequency needs to be updated;Discharge plan needs to be updated    Co-evaluation                 AM-PAC PT "6 Clicks" Daily Activity     Outcome Measure   Help from another person eating meals?: A Little Help from another person taking care of personal grooming?: A Little Help from another person toileting, which includes using toliet, bedpan, or urinal?: A Lot Help from another person bathing (including washing, rinsing, drying)?: A Lot Help from another person to put on and taking off regular upper body clothing?: A Lot Help from another person to put on and taking off regular lower body clothing?: A Lot 6 Click Score: 14    End of Session    OT Visit Diagnosis: Other abnormalities of gait and mobility (R26.89);Muscle weakness (generalized) (M62.81)   Activity Tolerance Patient tolerated treatment well   Patient Left in bed;with call bell/phone within reach;with bed alarm set   Nurse Communication          Time: 6720-9470 OT Time Calculation (min): 34 min  Charges: OT General Charges $OT Visit: 1 Procedure OT Treatments $Self Care/Home Management : 23-37 mins  Jeni Salles, MPH, MS, OTR/L ascom [phone] 05/02/17, 3:16 PM

## 2017-05-02 NOTE — Progress Notes (Signed)
Chaplain visited this pt in ICU, pt had been moved to the floor [id number]. CH made a follow up with pt, pt was sitting on the bed at the time of this visit. Pt states she was grateful to the medical team that has helped her during her medical troubles. Pt appeared relaxed and reflective as she talked about 2 things that her husband has learned from her ordeal; husband has learned to pray, and he has learned to love her more, she said. Pt requested for prayers, which Ch provided with a ministry of presence.    05/02/17 1300  Clinical Encounter Type  Visited With Patient  Visit Type Follow-up;Spiritual support  Referral From Chaplain  Spiritual Encounters  Spiritual Needs Prayer;Emotional;Other (Comment)

## 2017-05-02 NOTE — Progress Notes (Signed)
  End of hd 

## 2017-05-02 NOTE — Progress Notes (Signed)
Pre hd assessment. Patient in no distress  

## 2017-05-02 NOTE — Progress Notes (Signed)
OT Cancellation Note  Patient Details Name: Mitsue Peery MRN: [id number] DOB: 1951/08/21   Cancelled Treatment:    Reason Eval/Treat Not Completed: Patient at procedure or test/ unavailable. Pt unavailable upon initial attempt to treat today. (hemodialysis). Will re-attempt OT treatment later this date.   Jeni Salles, MPH, MS, OTR/L ascom [phone] 05/02/17, 12:56 PM

## 2017-05-02 NOTE — NC FL2 (Signed)
[place] LEVEL OF CARE SCREENING TOOL     IDENTIFICATION  Patient Name: [name] Birthdate: [birth year] Sex: female Admission Date (Current Location): 04/08/2017  [facility] and [place] Number:  Engineering geologist and Address:  [facility], [address] [place], [place] [address]      Provider Number: [id number]  Attending Physician Name and Address:  Hillary Bow, MD  Relative Name and Phone Number:       Current Level of Care: Hospital Recommended Level of Care: [place] Prior Approval Number:    Date Approved/Denied:   PASRR Number:    Discharge Plan: SNF    Current Diagnoses: Patient Active Problem List   Diagnosis Date Noted  . Pressure injury of skin 04/19/2017  . Acute renal failure (ARF) ([place])   . Cellulitis of lower extremity   . Edema extremities   . Acute respiratory failure ([place])   . Acute renal failure ([place])   . Multi-organ failure with heart failure ([place])   . Palliative care by specialist   . Goals of care, counseling/discussion   . Septic shock ([place]) 04/08/2017    Orientation RESPIRATION BLADDER Height & Weight      (no issues)  Normal Continent Weight: 166 lb 7.2 oz (75.5 kg) Height:  5\' 5"  (165.1 cm)  BEHAVIORAL SYMPTOMS/MOOD NEUROLOGICAL BOWEL NUTRITION STATUS   (none)  (none) Incontinent Diet  AMBULATORY STATUS COMMUNICATION OF NEEDS Skin   Extensive Assist Verbally PU Stage and Appropriate Care                       Personal Care Assistance Level of Assistance  Bathing, Dressing Bathing Assistance: Maximum assistance   Dressing Assistance: Maximum assistance     Functional Limitations Info   (no issues)          SPECIAL CARE FACTORS FREQUENCY  PT (By licensed PT)                    Contractures Contractures Info: Not present    Additional Factors Info  Code Status, Allergies Code Status Info: full Allergies Info: nka           Current  Medications (05/02/2017):  This is the current hospital active medication list Current Facility-Administered Medications  Medication Dose Route Frequency Provider Last Rate Last Dose  . acetaminophen (TYLENOL) tablet 650 mg  650 mg Oral Q4H PRN Varughese, Bincy S, NP   650 mg at 04/26/17 0523  . albumin human 25 % solution 25 g  25 g Intravenous Q M,W,F-HD Holley Raring, [place], MD   25 g at 05/02/17 0953  . amiodarone (PACERONE) tablet 200 mg  200 mg Oral BID Epifanio Lesches, MD   200 mg at 05/02/17 0931  . artificial tears (LACRILUBE) ophthalmic ointment   Both Eyes Q4H PRN Awilda Bill, NP      . carvedilol (COREG) tablet 3.125 mg  3.125 mg Oral BID WC Epifanio Lesches, MD   3.125 mg at 05/02/17 0847  . collagenase (SANTYL) ointment   Topical Daily Epifanio Lesches, MD      . famotidine (PEPCID) tablet 20 mg  20 mg Oral QHS Epifanio Lesches, MD   20 mg at 05/01/17 2036  . feeding supplement (BOOST / RESOURCE BREEZE) liquid 1 Container  1 Container Oral 5 X Daily Epifanio Lesches, MD   1 Container at 05/02/17 1311  . heparin injection 1,000-6,000 Units  1,000-6,000 Units Intracatheter PRN Harrel Lemon  D, MD   1,000 Units at 04/22/17 1239  . ipratropium-albuterol (DUONEB) 0.5-2.5 (3) MG/3ML nebulizer solution 3 mL  3 mL Nebulization Q4H PRN Wilhelmina Mcardle, MD      . levothyroxine (SYNTHROID, LEVOTHROID) tablet 100 mcg  100 mcg Oral QAC breakfast Epifanio Lesches, MD   100 mcg at 05/02/17 0444  . LORazepam (ATIVAN) injection 1 mg  1 mg Intravenous Q4H PRN Conforti, John, DO   1 mg at 04/15/17 0742  . metoprolol tartrate (LOPRESSOR) injection 2.5-5 mg  2.5-5 mg Intravenous Q3H PRN Wilhelmina Mcardle, MD   5 mg at 04/24/17 0802  . midodrine (PROAMATINE) tablet 10 mg  10 mg Oral TID WC Awilda Bill, NP   10 mg at 05/02/17 1309  . morphine 2 MG/ML injection 1-2 mg  1-2 mg Intravenous Q4H PRN Awilda Bill, NP   1 mg at 04/22/17 2139  . multivitamin (RENA-VIT) tablet 1  tablet  1 tablet Oral QHS Demetrios Loll, MD   1 tablet at 05/01/17 2036  . ondansetron (ZOFRAN) injection 4 mg  4 mg Intravenous Q6H PRN Harrie Foreman, MD      . phenol (CHLORASEPTIC) mouth spray 2 spray  2 spray Mouth/Throat PRN Awilda Bill, NP      . QUEtiapine (SEROQUEL) tablet 25 mg  25 mg Oral QHS Epifanio Lesches, MD   25 mg at 05/01/17 2036  . sertraline (ZOLOFT) tablet 25 mg  25 mg Oral QHS Sudini, Srikar, MD      . sodium chloride flush (NS) 0.9 % injection 10-40 mL  10-40 mL Intracatheter PRN Baxter Hire, MD   10 mL at 04/30/17 2127  . tuberculin injection 5 Units  5 Units Intradermal Once Epifanio Lesches, MD   5 Units at 05/01/17 1722  . zinc oxide (BALMEX) 11.3 % cream   Topical QID PRN Lenis Noon, [facility]         Discharge Medications: Please see discharge summary for a list of discharge medications.  Relevant Imaging Results:  Relevant Lab Results:   Additional Information ss: [id number]  Shela Leff, LCSW

## 2017-05-02 NOTE — Care Management (Signed)
OT consult pending .  Melissa from inpatient rehab to meet with patient today

## 2017-05-02 NOTE — Progress Notes (Signed)
HD initiated via R Chest HD catheter. Will give albumin with treatment per Dr. Juleen China at bedside in HD unit. Currently bp stable. Patient has no complaints. Continue to monitor.

## 2017-05-02 NOTE — Progress Notes (Signed)
Post hd assessment 

## 2017-05-02 NOTE — Progress Notes (Signed)
[facility]  ROUNDING NOTE   Subjective:   Seen and examined on hemodialysis. Tolerating treatment well. UF goal of 2 litres.    Objective:  Vital signs in last 24 hours:  Temp:  [97.8 F (36.6 C)-98.6 F (37 C)] 98.1 F (36.7 C) (07/25 0919) Pulse Rate:  [101-115] 106 (07/25 0945) Resp:  [13-22] 16 (07/25 0945) BP: (91-118)/(54-81) 104/79 (07/25 0945) SpO2:  [93 %-99 %] 98 % (07/25 0945) Weight:  [75.5 kg (166 lb 7.2 oz)] 75.5 kg (166 lb 7.2 oz) (07/25 0919)  Weight change:  Filed Weights   04/30/17 0600 04/30/17 1155 05/02/17 0919  Weight: 77.2 kg (170 lb 3.1 oz) 77.2 kg (170 lb 3.1 oz) 75.5 kg (166 lb 7.2 oz)    Intake/Output: I/O last 3 completed shifts: In: 960 [P.O.:960] Out: 0    Intake/Output this shift:  No intake/output data recorded.  Physical Exam: General: Sitting in bed  Head: Normocephalic, atraumatic  Eyes: PERRL  Neck: supple  Lungs:  clear  Heart: Irregular  Abdomen:  Soft, nontender, obese  Extremities: 1+ bilateral lower extremity edema   Neurologic: Awake, alert, following commands   Skin: Warm, dry  Access: Right IJ permcath 7/19 Dr. Lucky Cowboy    Basic Metabolic Panel:  Recent Labs Lab 04/25/17 1730 04/25/17 2335 04/26/17 0522 04/27/17 4163 04/27/17 1730 04/28/17 0507 04/30/17 0501 05/01/17 0613  NA 140  --  138 138  --  138 137 137  K 4.0  --  4.3 4.7  --  3.5 4.6 3.8  CL 100*  --  100* 100*  --  96* 98* 96*  CO2 29  --  27 25  --  30 26 27   GLUCOSE 110*  --  129* 77  --  79 66 95  BUN 24*  --  50* 76*  --  38* 74* 47*  CREATININE 0.55  --  1.05* 1.96*  --  1.41* 3.01* 2.09*  CALCIUM 8.6*  --  8.2* 7.7*  --  7.8* 7.9* 8.4*  MG 1.8  --   --   --   --   --   --   --   PHOS 2.8 3.8  --  7.4* 4.5  --  7.8*  --     Liver Function Tests:  Recent Labs Lab 04/25/17 1730 04/28/17 0507  AST  --  51*  ALT  --  59*  ALKPHOS  --  235*  BILITOT  --  2.4*  PROT  --  5.8*  ALBUMIN 4.3 3.4*   No results for input(s):  LIPASE, AMYLASE in the last 168 hours. No results for input(s): AMMONIA in the last 168 hours.  CBC:  Recent Labs Lab 04/26/17 0742 04/27/17 0415 04/28/17 0507 05/01/17 0613  WBC 10.0 8.4 8.4 8.8  NEUTROABS 7.8* 5.9  --   --   HGB 8.6* 8.3* 8.4* 8.9*  HCT 25.6* 24.6* 24.8* 26.4*  MCV 95.9 96.4 97.8 97.6  PLT 103* 104* 120* 127*    Cardiac Enzymes: No results for input(s): CKTOTAL, CKMB, CKMBINDEX, TROPONINI in the last 168 hours.  BNP: Invalid input(s): POCBNP  CBG:  Recent Labs Lab 05/01/17 0834 05/01/17 1142 05/01/17 1648 05/02/17 0738 05/02/17 0800  GLUCAP 71 116* 86 68 82    Microbiology: Results for orders placed or performed during the hospital encounter of 04/08/17  Culture, blood (routine x 2)     Status: None   Collection Time: 04/08/17  2:04 AM  Result Value Ref  Range Status   Specimen Description BLOOD RIGHT ANTECUBITAL  Final   Special Requests   Final    BOTTLES DRAWN AEROBIC AND ANAEROBIC Blood Culture adequate volume   Culture NO GROWTH 5 DAYS  Final   Report Status 04/13/2017 FINAL  Final  Culture, blood (routine x 2)     Status: None   Collection Time: 04/08/17  2:06 AM  Result Value Ref Range Status   Specimen Description BLOOD LEFT ANTECUBITAL  Final   Special Requests   Final    BOTTLES DRAWN AEROBIC AND ANAEROBIC Blood Culture adequate volume   Culture NO GROWTH 5 DAYS  Final   Report Status 04/13/2017 FINAL  Final  C difficile quick scan w PCR reflex     Status: None   Collection Time: 04/08/17  5:09 AM  Result Value Ref Range Status   C Diff antigen NEGATIVE NEGATIVE Final   C Diff toxin NEGATIVE NEGATIVE Final   C Diff interpretation No C. difficile detected.  Final  MRSA PCR Screening     Status: None   Collection Time: 04/08/17  6:50 AM  Result Value Ref Range Status   MRSA by PCR NEGATIVE NEGATIVE Final    Comment:        The GeneXpert MRSA Assay (FDA approved for NASAL specimens only), is one component of a comprehensive  MRSA colonization surveillance program. It is not intended to diagnose MRSA infection nor to guide or monitor treatment for MRSA infections.   Urine culture     Status: None   Collection Time: 04/08/17 12:16 PM  Result Value Ref Range Status   Specimen Description URINE, RANDOM  Final   Special Requests NONE  Final   Culture   Final    NO GROWTH Performed at [facility], [address] [place], [place] [address]    Report Status 04/10/2017 FINAL  Final  Aerobic Culture (superficial specimen)     Status: None   Collection Time: 04/09/17 11:32 AM  Result Value Ref Range Status   Specimen Description SKIN  Final   Special Requests NONE  Final   Gram Stain   Final    RARE WBC PRESENT, PREDOMINANTLY PMN NO SQUAMOUS EPITHELIAL CELLS SEEN NO ORGANISMS SEEN    Culture   Final    NO GROWTH 2 DAYS Performed at [facility], [place] [address], [place], [place] [address]    Report Status 04/11/2017 FINAL  Final  Gastrointestinal Panel by PCR , Stool     Status: None   Collection Time: 04/09/17  5:10 PM  Result Value Ref Range Status   Campylobacter species NOT DETECTED NOT DETECTED Final   Plesimonas shigelloides NOT DETECTED NOT DETECTED Final   Salmonella species NOT DETECTED NOT DETECTED Final   Yersinia enterocolitica NOT DETECTED NOT DETECTED Final   Vibrio species NOT DETECTED NOT DETECTED Final   Vibrio cholerae NOT DETECTED NOT DETECTED Final   Enteroaggregative E coli (EAEC) NOT DETECTED NOT DETECTED Final   Enteropathogenic E coli (EPEC) NOT DETECTED NOT DETECTED Final   Enterotoxigenic E coli (ETEC) NOT DETECTED NOT DETECTED Final   Shiga like toxin producing E coli (STEC) NOT DETECTED NOT DETECTED Final   Shigella/Enteroinvasive E coli (EIEC) NOT DETECTED NOT DETECTED Final   Cryptosporidium NOT DETECTED NOT DETECTED Final   Cyclospora cayetanensis NOT DETECTED NOT DETECTED Final   Entamoeba histolytica NOT DETECTED NOT DETECTED Final   Giardia lamblia NOT  DETECTED NOT DETECTED Final   Adenovirus  F40/41 NOT DETECTED NOT DETECTED Final   Astrovirus NOT DETECTED NOT DETECTED Final   Norovirus GI/GII NOT DETECTED NOT DETECTED Final   Rotavirus A NOT DETECTED NOT DETECTED Final   Sapovirus (I, II, IV, and V) NOT DETECTED NOT DETECTED Final  Culture, respiratory (NON-Expectorated)     Status: None   Collection Time: 04/12/17 12:00 PM  Result Value Ref Range Status   Specimen Description TRACHEAL ASPIRATE  Final   Special Requests NONE  Final   Gram Stain   Final    MODERATE WBC PRESENT,BOTH PMN AND MONONUCLEAR MODERATE YEAST Performed at [facility], [place] [address], [place], [place] [address]    Culture MODERATE CANDIDA TROPICALIS  Final   Report Status 04/14/2017 FINAL  Final  Culture, respiratory (NON-Expectorated)     Status: None   Collection Time: 04/15/17  7:48 AM  Result Value Ref Range Status   Specimen Description TRACHEAL ASPIRATE  Final   Special Requests Normal  Final   Gram Stain   Final    RARE WBC PRESENT, PREDOMINANTLY PMN FEW BUDDING YEAST SEEN Performed at [facility], [place] [address] [place], [place] [address]    Culture ABUNDANT CANDIDA TROPICALIS  Final   Report Status 04/18/2017 FINAL  Final  CULTURE, BLOOD (ROUTINE X 2) w Reflex to ID Panel     Status: None   Collection Time: 04/15/17 10:46 AM  Result Value Ref Range Status   Specimen Description BLOOD LEFT SUBCLAVIAN TRIPLE LUMEN  Final   Special Requests   Final    BOTTLES DRAWN AEROBIC AND ANAEROBIC Blood Culture results may not be optimal due to an excessive volume of blood received in culture bottles   Culture NO GROWTH 5 DAYS  Final   Report Status 04/20/2017 FINAL  Final  CULTURE, BLOOD (ROUTINE X 2) w Reflex to ID Panel     Status: None   Collection Time: 04/16/17  1:21 PM  Result Value Ref Range Status   Specimen Description BLOOD BLOOD LEFT HAND  Final   Special Requests   Final    BOTTLES DRAWN AEROBIC AND ANAEROBIC Blood Culture  results may not be optimal due to an inadequate volume of blood received in culture bottles   Culture NO GROWTH 5 DAYS  Final   Report Status 04/21/2017 FINAL  Final  CULTURE, BLOOD (ROUTINE X 2) w Reflex to ID Panel     Status: None   Collection Time: 04/16/17  1:27 PM  Result Value Ref Range Status   Specimen Description BLOOD A-LINE DRAW  Final   Special Requests   Final    BOTTLES DRAWN AEROBIC AND ANAEROBIC Blood Culture adequate volume   Culture NO GROWTH 5 DAYS  Final   Report Status 04/21/2017 FINAL  Final  C difficile quick scan w PCR reflex     Status: None   Collection Time: 04/16/17 10:15 PM  Result Value Ref Range Status   C Diff antigen NEGATIVE NEGATIVE Final   C Diff toxin NEGATIVE NEGATIVE Final   C Diff interpretation No C. difficile detected.  Final    Comment: VALID  Culture, blood (Routine X 2) w Reflex to ID Panel     Status: None   Collection Time: 04/26/17  7:30 AM  Result Value Ref Range Status   Specimen Description BLOOD PICC LINE  Final   Special Requests   Final    BOTTLES DRAWN AEROBIC AND ANAEROBIC Blood Culture adequate volume   Culture NO  GROWTH 5 DAYS  Final   Report Status 05/01/2017 FINAL  Final  Culture, blood (Routine X 2) w Reflex to ID Panel     Status: None   Collection Time: 04/26/17  7:30 AM  Result Value Ref Range Status   Specimen Description BLOOD RIGHT HAND  Final   Special Requests   Final    BOTTLES DRAWN AEROBIC AND ANAEROBIC Blood Culture adequate volume   Culture NO GROWTH 5 DAYS  Final   Report Status 05/01/2017 FINAL  Final    Coagulation Studies: No results for input(s): LABPROT, INR in the last 72 hours.  Urinalysis: No results for input(s): COLORURINE, LABSPEC, PHURINE, GLUCOSEU, HGBUR, BILIRUBINUR, KETONESUR, PROTEINUR, UROBILINOGEN, NITRITE, LEUKOCYTESUR in the last 72 hours.  Invalid input(s): APPERANCEUR    Imaging: No results found.   Medications:   . albumin human     . amiodarone  200 mg Oral BID  .  carvedilol  3.125 mg Oral BID WC  . collagenase   Topical Daily  . famotidine  20 mg Oral QHS  . feeding supplement  1 Container Oral 5 X Daily  . levothyroxine  100 mcg Oral QAC breakfast  . midodrine  10 mg Oral TID WC  . multivitamin  1 tablet Oral QHS  . QUEtiapine  25 mg Oral QHS  . tuberculin  5 Units Intradermal Once   acetaminophen, artificial tears, heparin, ipratropium-albuterol, LORazepam, metoprolol tartrate, morphine injection, [DISCONTINUED] ondansetron **OR** ondansetron (ZOFRAN) IV, phenol, sodium chloride flush, zinc oxide  Assessment/ Plan:  [name] is a 66 y.o. white female  with hypertension, hypothyroidism, atrial fibrillation, who was admitted to [facility] on 04/08/2017 for evaluation of increasing lower extremity edema.  Hospital course: Admitted on 04/08/2017 with significant peripheral edema and cellulitis. Started on CRRT for hypotension and anuric renal failure on 7/2. Patient weaned off CRRT on 7/4. However later that day, patient had cardiac arrest and coded. Intubated, sedated and restarted on three vasopressors. Restart on CRRT on 7/4.  2-D echo which shows EF 25-30% Now transitioned to intermittent hemodialysis MWF  1. Acute renal failure with proteinuria and hematuria: baseline creatinine of 0.62 09/2014.  Patient with massive peripheral edema, anasarca and hypoalbuminemia.  Recent IV contrast exposure on 04/04/17.  - Serologic work up: compliments normal, negative SPEP/UPEP, ANA negative, ANCA negative, GBM negative, negative hepatitis - Patient remains anuric. Hemodialysis MWF schedule. RIJ permcath.  Outpatient planning for [facility]  2. 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 Echocardiogram with EF of 25-30% - Continue midodrine to help sustain blood pressure.  - IV albumin with HD treatment    3. 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  - Continue glucose control   LOS: [address] 7/25/20189:46 AM

## 2017-05-02 NOTE — Progress Notes (Signed)
Patient ID: [name], female   DOB: 08-09-1951, 66 y.o.   MRN: [id number]     [facility] PROGRESS NOTE  [name] MVE:[id number] DOB: 1951/04/09 DOA: 04/08/2017 PCP: Tracie Harrier, MD  HPI/Subjective:  Feels weak. No shortness of breath or abdominal pain. Seen during hemodialysis.  Objective: Vitals:   05/02/17 1130 05/02/17 1156  BP: (!) 80/54 (!) 85/65  Pulse: (!) 102 96  Resp: 16 16  Temp:      Filed Weights   04/30/17 0600 04/30/17 1155 05/02/17 0919  Weight: 77.2 kg (170 lb 3.1 oz) 77.2 kg (170 lb 3.1 oz) 75.5 kg (166 lb 7.2 oz)    ROS: Review of Systems  Constitutional: Negative for chills, fever and malaise/fatigue.  HENT: Negative for hearing loss.   Eyes: Negative for blurred vision, double vision and photophobia.  Respiratory: Negative for cough, hemoptysis and shortness of breath.   Cardiovascular: Negative for chest pain, palpitations, orthopnea and leg swelling.  Gastrointestinal: Negative for abdominal pain, diarrhea and vomiting.  Genitourinary: Negative for dysuria and urgency.  Musculoskeletal: Negative for myalgias and neck pain.  Skin: Negative for rash.  Neurological: Positive for weakness. Negative for dizziness, focal weakness, seizures and headaches.  Psychiatric/Behavioral: Negative for memory loss. The patient does not have insomnia.     Exam: Physical Exam  HENT:  Head: Atraumatic.  Nose: No mucosal edema.  Mouth/Throat: No oropharyngeal exudate or posterior oropharyngeal edema.  Eyes: Pupils are equal, round, and reactive to light. Lids are normal.  Neck: Carotid bruit is not present. No thyromegaly present.  Cardiovascular: Regular rhythm, S1 normal, S2 normal and normal heart sounds.   Respiratory: She has no decreased breath sounds. She has no wheezes. She has no rales.  GI: Soft. Bowel sounds are normal. There is no tenderness.  Musculoskeletal:       Right ankle: She exhibits swelling.       Left ankle: She exhibits  swelling.  Lymphadenopathy:    She has no cervical adenopathy.  Skin: Skin is warm. No cyanosis.  Psychiatric: Her affect is blunt.  Skin infection with cellulitis of left ankle.    Data Reviewed: Basic Metabolic Panel:  Recent Labs Lab 04/25/17 1730 04/25/17 2335  04/27/17 0415 04/27/17 1730 04/28/17 0507 04/30/17 0501 05/01/17 0613 05/02/17 0900  NA 140  --   < > 138  --  138 137 137 140  K 4.0  --   < > 4.7  --  3.5 4.6 3.8 4.6  CL 100*  --   < > 100*  --  96* 98* 96* 98*  CO2 29  --   < > 25  --  30 26 27 28   GLUCOSE 110*  --   < > 77  --  79 66 95 74  BUN 24*  --   < > 76*  --  38* 74* 47* 72*  CREATININE 0.55  --   < > 1.96*  --  1.41* 3.01* 2.09* 3.08*  CALCIUM 8.6*  --   < > 7.7*  --  7.8* 7.9* 8.4* 8.7*  MG 1.8  --   --   --   --   --   --   --   --   PHOS 2.8 3.8  --  7.4* 4.5  --  7.8*  --  6.9*  < > = values in this interval not displayed. Liver Function Tests: CBC:  Recent Labs Lab 04/26/17 0742 04/27/17 0415 04/28/17 0507 05/01/17 2836 05/02/17  0900  WBC 10.0 8.4 8.4 8.8 10.7  NEUTROABS 7.8* 5.9  --   --   --   HGB 8.6* 8.3* 8.4* 8.9* 8.9*  HCT 25.6* 24.6* 24.8* 26.4* 26.8*  MCV 95.9 96.4 97.8 97.6 97.8  PLT 103* 104* 120* 127* 140*   BNP (last 3 results)  Recent Labs  04/08/17 0139  BNP 2,012.0*     CBG:  Recent Labs Lab 05/01/17 0834 05/01/17 1142 05/01/17 1648 05/02/17 0738 05/02/17 0800  GLUCAP 71 116* 86 68 82    Recent Results (from the past 240 hour(s))  Culture, blood (Routine X 2) w Reflex to ID Panel     Status: None   Collection Time: 04/26/17  7:30 AM  Result Value Ref Range Status   Specimen Description BLOOD PICC LINE  Final   Special Requests   Final    BOTTLES DRAWN AEROBIC AND ANAEROBIC Blood Culture adequate volume   Culture NO GROWTH 5 DAYS  Final   Report Status 05/01/2017 FINAL  Final  Culture, blood (Routine X 2) w Reflex to ID Panel     Status: None   Collection Time: 04/26/17  7:30 AM  Result Value  Ref Range Status   Specimen Description BLOOD RIGHT HAND  Final   Special Requests   Final    BOTTLES DRAWN AEROBIC AND ANAEROBIC Blood Culture adequate volume   Culture NO GROWTH 5 DAYS  Final   Report Status 05/01/2017 FINAL  Final     Studies: No results found.  Scheduled Meds: . amiodarone  200 mg Oral BID  . carvedilol  3.125 mg Oral BID WC  . collagenase   Topical Daily  . famotidine  20 mg Oral QHS  . feeding supplement  1 Container Oral 5 X Daily  . levothyroxine  100 mcg Oral QAC breakfast  . midodrine  10 mg Oral TID WC  . multivitamin  1 tablet Oral QHS  . QUEtiapine  25 mg Oral QHS  . tuberculin  5 Units Intradermal Once   Continuous Infusions: . albumin human      Assessment/Plan:  # Acute hypoxic respiratory failure on full ventilatory support. Now extubated. On room air.   # PEA arrest secondary to septic shock. Recovered well after resuscitation through ACLS protocol.  # Septic shock secondary to lower extremity cellulitis. Off pressors. Cellulitis resolved. Antibiotics stopped.  # Shock liver secondary to septic shock, hypotension: Improving. Patient is tolerating diet.   #Acute renal failure with proteinuria, hematuria: Baseline creatinine 0.62. Patient anuric. Continue hemodialysis. Tunneled catheter in place.  # Hypertension. Started on Midodrin in the ICU. Continue.  # hypothyroidism: Continue Synthroid.  #Depression: Continue Seroquel.  # Diabetes mellitus type 2, Uncontrolled with hypoglycemia Was on D10. Now stopped. Continue Accu-Cheks.  CIR vs SNF at discharge  Code Status:     Code Status Orders        Start     Ordered   04/08/17 0651  Full code  Continuous     04/08/17 0650    Code Status History    Date Active Date Inactive Code Status Order ID Comments User Context   This patient has a current code status but no historical code status.      Consultants:  Critical care specialist  Infectious  disease  Nephrology  Palliative care  Cardiology  Time spent: 25 minutes.   Itha Kroeker, [employer]

## 2017-05-03 LAB — GLUCOSE, CAPILLARY
Glucose-Capillary: 72 mg/dL (ref 65–99)
Glucose-Capillary: 74 mg/dL (ref 65–99)
Glucose-Capillary: 103 mg/dL — ABNORMAL HIGH (ref 65–99)
Glucose-Capillary: 104 mg/dL — ABNORMAL HIGH (ref 65–99)
Glucose-Capillary: 101 mg/dL — ABNORMAL HIGH (ref 65–99)

## 2017-05-03 MED ORDER — DILTIAZEM HCL ER COATED BEADS 120 MG PO CP24
120.0000 mg | ORAL_CAPSULE | Freq: Every day | ORAL | Status: DC
  Administered 2017-05-03 – 2017-05-07 (×5): 120 mg via ORAL
  Filled 2017-05-03 (×5): qty 1

## 2017-05-03 NOTE — Progress Notes (Signed)
[facility]  ROUNDING NOTE   Subjective:   Hemodialysis treatment yesterday. Tolerated treatment well. UF of 1.8 liters  Objective:  Vital signs in last 24 hours:  Temp:  [97.6 F (36.4 C)-98.7 F (37.1 C)] 97.9 F (36.6 C) (07/26 0502) Pulse Rate:  [96-109] 104 (07/26 0849) Resp:  [14-20] 17 (07/26 0502) BP: (94-119)/(61-83) 112/83 (07/26 1106) SpO2:  [98 %] 98 % (07/26 0502) Weight:  [75.8 kg (167 lb)] 75.8 kg (167 lb) (07/26 0500)  Weight change:  Filed Weights   04/30/17 1155 05/02/17 0919 05/03/17 0500  Weight: 77.2 kg (170 lb 3.1 oz) 75.5 kg (166 lb 7.2 oz) 75.8 kg (167 lb)    Intake/Output: I/O last 3 completed shifts: In: 240 [P.O.:240] Out: 1800 [Other:1800]   Intake/Output this shift:  Total I/O In: 240 [P.O.:240] Out: 0   Physical Exam: General: Sitting in bed  Head: Normocephalic, atraumatic  Eyes: PERRL  Neck: supple  Lungs:  clear  Heart: Irregular  Abdomen:  Soft, nontender, obese  Extremities: trace bilateral lower extremity edema   Neurologic: Awake, alert, following commands   Skin: Warm, dry  Access: Right IJ permcath 7/19 Dr. Lucky Cowboy    Basic Metabolic Panel:  Recent Labs Lab 04/27/17 0415 04/27/17 1730 04/28/17 0507 04/30/17 0501 05/01/17 0613 05/02/17 0900  NA 138  --  138 137 137 140  K 4.7  --  3.5 4.6 3.8 4.6  CL 100*  --  96* 98* 96* 98*  CO2 25  --  30 26 27 28   GLUCOSE 77  --  79 66 95 74  BUN 76*  --  38* 74* 47* 72*  CREATININE 1.96*  --  1.41* 3.01* 2.09* 3.08*  CALCIUM 7.7*  --  7.8* 7.9* 8.4* 8.7*  PHOS 7.4* 4.5  --  7.8*  --  6.9*    Liver Function Tests:  Recent Labs Lab 04/28/17 0507 05/02/17 0900  AST 51*  --   ALT 59*  --   ALKPHOS 235*  --   BILITOT 2.4*  --   PROT 5.8*  --   ALBUMIN 3.4* 3.6   No results for input(s): LIPASE, AMYLASE in the last 168 hours. No results for input(s): AMMONIA in the last 168 hours.  CBC:  Recent Labs Lab 04/27/17 0415 04/28/17 0507 05/01/17 0613  05/02/17 0900  WBC 8.4 8.4 8.8 10.7  NEUTROABS 5.9  --   --   --   HGB 8.3* 8.4* 8.9* 8.9*  HCT 24.6* 24.8* 26.4* 26.8*  MCV 96.4 97.8 97.6 97.8  PLT 104* 120* 127* 140*    Cardiac Enzymes: No results for input(s): CKTOTAL, CKMB, CKMBINDEX, TROPONINI in the last 168 hours.  BNP: Invalid input(s): POCBNP  CBG:  Recent Labs Lab 05/02/17 0800 05/02/17 1652 05/03/17 0308 05/03/17 0756 05/03/17 1137  GLUCAP 82 92 72 103 103*    Microbiology: Results for orders placed or performed during the hospital encounter of 04/08/17  Culture, blood (routine x 2)     Status: None   Collection Time: 04/08/17  2:04 AM  Result Value Ref Range Status   Specimen Description BLOOD RIGHT ANTECUBITAL  Final   Special Requests   Final    BOTTLES DRAWN AEROBIC AND ANAEROBIC Blood Culture adequate volume   Culture NO GROWTH 5 DAYS  Final   Report Status 04/13/2017 FINAL  Final  Culture, blood (routine x 2)     Status: None   Collection Time: 04/08/17  2:06 AM  Result Value  Ref Range Status   Specimen Description BLOOD LEFT ANTECUBITAL  Final   Special Requests   Final    BOTTLES DRAWN AEROBIC AND ANAEROBIC Blood Culture adequate volume   Culture NO GROWTH 5 DAYS  Final   Report Status 04/13/2017 FINAL  Final  C difficile quick scan w PCR reflex     Status: None   Collection Time: 04/08/17  5:09 AM  Result Value Ref Range Status   C Diff antigen NEGATIVE NEGATIVE Final   C Diff toxin NEGATIVE NEGATIVE Final   C Diff interpretation No C. difficile detected.  Final  MRSA PCR Screening     Status: None   Collection Time: 04/08/17  6:50 AM  Result Value Ref Range Status   MRSA by PCR NEGATIVE NEGATIVE Final    Comment:        The GeneXpert MRSA Assay (FDA approved for NASAL specimens only), is one component of a comprehensive MRSA colonization surveillance program. It is not intended to diagnose MRSA infection nor to guide or monitor treatment for MRSA infections.   Urine culture      Status: None   Collection Time: 04/08/17 12:16 PM  Result Value Ref Range Status   Specimen Description URINE, RANDOM  Final   Special Requests NONE  Final   Culture   Final    NO GROWTH Performed at [facility], [address], [place], [place] [address]    Report Status 04/10/2017 FINAL  Final  Aerobic Culture (superficial specimen)     Status: None   Collection Time: 04/09/17 11:32 AM  Result Value Ref Range Status   Specimen Description SKIN  Final   Special Requests NONE  Final   Gram Stain   Final    RARE WBC PRESENT, PREDOMINANTLY PMN NO SQUAMOUS EPITHELIAL CELLS SEEN NO ORGANISMS SEEN    Culture   Final    NO GROWTH 2 DAYS Performed at [facility], [place] [address] [place], [place] [address]    Report Status 04/11/2017 FINAL  Final  Gastrointestinal Panel by PCR , Stool     Status: None   Collection Time: 04/09/17  5:10 PM  Result Value Ref Range Status   Campylobacter species NOT DETECTED NOT DETECTED Final   Plesimonas shigelloides NOT DETECTED NOT DETECTED Final   Salmonella species NOT DETECTED NOT DETECTED Final   Yersinia enterocolitica NOT DETECTED NOT DETECTED Final   Vibrio species NOT DETECTED NOT DETECTED Final   Vibrio cholerae NOT DETECTED NOT DETECTED Final   Enteroaggregative E coli (EAEC) NOT DETECTED NOT DETECTED Final   Enteropathogenic E coli (EPEC) NOT DETECTED NOT DETECTED Final   Enterotoxigenic E coli (ETEC) NOT DETECTED NOT DETECTED Final   Shiga like toxin producing E coli (STEC) NOT DETECTED NOT DETECTED Final   Shigella/Enteroinvasive E coli (EIEC) NOT DETECTED NOT DETECTED Final   Cryptosporidium NOT DETECTED NOT DETECTED Final   Cyclospora cayetanensis NOT DETECTED NOT DETECTED Final   Entamoeba histolytica NOT DETECTED NOT DETECTED Final   Giardia lamblia NOT DETECTED NOT DETECTED Final   Adenovirus F40/41 NOT DETECTED NOT DETECTED Final   Astrovirus NOT DETECTED NOT DETECTED Final   Norovirus GI/GII NOT DETECTED NOT  DETECTED Final   Rotavirus A NOT DETECTED NOT DETECTED Final   Sapovirus (I, II, IV, and V) NOT DETECTED NOT DETECTED Final  Culture, respiratory (NON-Expectorated)     Status: None   Collection Time: 04/12/17 12:00 PM  Result Value Ref Range Status  Specimen Description TRACHEAL ASPIRATE  Final   Special Requests NONE  Final   Gram Stain   Final    MODERATE WBC PRESENT,BOTH PMN AND MONONUCLEAR MODERATE YEAST Performed at [facility], [address], [place], [place] [address]    Culture MODERATE CANDIDA TROPICALIS  Final   Report Status 04/14/2017 FINAL  Final  Culture, respiratory (NON-Expectorated)     Status: None   Collection Time: 04/15/17  7:48 AM  Result Value Ref Range Status   Specimen Description TRACHEAL ASPIRATE  Final   Special Requests Normal  Final   Gram Stain   Final    RARE WBC PRESENT, PREDOMINANTLY PMN FEW BUDDING YEAST SEEN Performed at [facility], [place] [address] [place], [place] [address]    Culture ABUNDANT CANDIDA TROPICALIS  Final   Report Status 04/18/2017 FINAL  Final  CULTURE, BLOOD (ROUTINE X 2) w Reflex to ID Panel     Status: None   Collection Time: 04/15/17 10:46 AM  Result Value Ref Range Status   Specimen Description BLOOD LEFT SUBCLAVIAN TRIPLE LUMEN  Final   Special Requests   Final    BOTTLES DRAWN AEROBIC AND ANAEROBIC Blood Culture results may not be optimal due to an excessive volume of blood received in culture bottles   Culture NO GROWTH 5 DAYS  Final   Report Status 04/20/2017 FINAL  Final  CULTURE, BLOOD (ROUTINE X 2) w Reflex to ID Panel     Status: None   Collection Time: 04/16/17  1:21 PM  Result Value Ref Range Status   Specimen Description BLOOD BLOOD LEFT HAND  Final   Special Requests   Final    BOTTLES DRAWN AEROBIC AND ANAEROBIC Blood Culture results may not be optimal due to an inadequate volume of blood received in culture bottles   Culture NO GROWTH 5 DAYS  Final   Report Status 04/21/2017 FINAL  Final   CULTURE, BLOOD (ROUTINE X 2) w Reflex to ID Panel     Status: None   Collection Time: 04/16/17  1:27 PM  Result Value Ref Range Status   Specimen Description BLOOD A-LINE DRAW  Final   Special Requests   Final    BOTTLES DRAWN AEROBIC AND ANAEROBIC Blood Culture adequate volume   Culture NO GROWTH 5 DAYS  Final   Report Status 04/21/2017 FINAL  Final  C difficile quick scan w PCR reflex     Status: None   Collection Time: 04/16/17 10:15 PM  Result Value Ref Range Status   C Diff antigen NEGATIVE NEGATIVE Final   C Diff toxin NEGATIVE NEGATIVE Final   C Diff interpretation No C. difficile detected.  Final    Comment: VALID  Culture, blood (Routine X 2) w Reflex to ID Panel     Status: None   Collection Time: 04/26/17  7:30 AM  Result Value Ref Range Status   Specimen Description BLOOD PICC LINE  Final   Special Requests   Final    BOTTLES DRAWN AEROBIC AND ANAEROBIC Blood Culture adequate volume   Culture NO GROWTH 5 DAYS  Final   Report Status 05/01/2017 FINAL  Final  Culture, blood (Routine X 2) w Reflex to ID Panel     Status: None   Collection Time: 04/26/17  7:30 AM  Result Value Ref Range Status   Specimen Description BLOOD RIGHT HAND  Final   Special Requests   Final    BOTTLES DRAWN AEROBIC AND ANAEROBIC Blood Culture  adequate volume   Culture NO GROWTH 5 DAYS  Final   Report Status 05/01/2017 FINAL  Final    Coagulation Studies: No results for input(s): LABPROT, INR in the last 72 hours.  Urinalysis: No results for input(s): COLORURINE, LABSPEC, PHURINE, GLUCOSEU, HGBUR, BILIRUBINUR, KETONESUR, PROTEINUR, UROBILINOGEN, NITRITE, LEUKOCYTESUR in the last 72 hours.  Invalid input(s): APPERANCEUR    Imaging: No results found.   Medications:   . albumin human     . amiodarone  200 mg Oral BID  . collagenase   Topical Daily  . diltiazem  120 mg Oral Daily  . famotidine  20 mg Oral QHS  . feeding supplement  1 Container Oral 5 X Daily  . levothyroxine  100  mcg Oral QAC breakfast  . midodrine  10 mg Oral TID WC  . multivitamin  1 tablet Oral QHS  . QUEtiapine  25 mg Oral QHS  . sertraline  25 mg Oral QHS  . tuberculin  5 Units Intradermal Once   acetaminophen, artificial tears, heparin, ipratropium-albuterol, LORazepam, metoprolol tartrate, morphine injection, [DISCONTINUED] ondansetron **OR** ondansetron (ZOFRAN) IV, phenol, sodium chloride flush, zinc oxide  Assessment/ Plan:  Ms. Karlin Binion is a 66 y.o. white female  with hypertension, hypothyroidism, atrial fibrillation, who was admitted to [facility] on 04/08/2017 for evaluation of increasing lower extremity edema.  Hospital course: Admitted on 04/08/2017 with significant peripheral edema and cellulitis. Started on CRRT for hypotension and anuric renal failure on 7/2. Patient weaned off CRRT on 7/4. However later that day, patient had cardiac arrest and coded. Intubated, sedated and restarted on three vasopressors. Restart on CRRT on 7/4.  2-D echo which shows EF 25-30% Now transitioned to intermittent hemodialysis MWF  1. Acute renal failure with proteinuria and hematuria: most likely transitioned to End Stage Renal Disease baseline creatinine of 0.62 09/2014.  Patient with massive peripheral edema, anasarca and hypoalbuminemia on admission Recent IV contrast exposure on 04/04/17.  - Serologic work up: compliments normal, negative SPEP/UPEP, ANA negative, ANCA negative, GBM negative, negative hepatitis - Patient remains anuric. Hemodialysis MWF schedule. RIJ permcath.  Outpatient planning for [place]  2. 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 Echocardiogram with EF of 25-30% - Continue midodrine to help sustain blood pressure.  - IV albumin with HD treatment yesterday  3. 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  - Continue glucose control    LOS: [facility], [place] 7/26/201812:26 PM

## 2017-05-03 NOTE — Progress Notes (Signed)
Patient ID: [name], female   DOB: 1951-01-05, 66 y.o.   MRN: [id number]  [facility] PROGRESS NOTE  [name] YPP:[id number] DOB: [birth year] DOA: 04/08/2017 PCP: Tracie Harrier, MD  HPI/Subjective:  Feels weak. No shortness of breath or abdominal pain.  Objective: Vitals:   05/03/17 1106 05/03/17 1232  BP: 112/83 105/71  Pulse:  (!) 116  Resp:    Temp:  98.2 F (36.8 C)    Filed Weights   04/30/17 1155 05/02/17 0919 05/03/17 0500  Weight: 77.2 kg (170 lb 3.1 oz) 75.5 kg (166 lb 7.2 oz) 75.8 kg (167 lb)    ROS: Review of Systems  Constitutional: Negative for chills, fever and malaise/fatigue.  HENT: Negative for hearing loss.   Eyes: Negative for blurred vision, double vision and photophobia.  Respiratory: Negative for cough, hemoptysis and shortness of breath.   Cardiovascular: Negative for chest pain, palpitations, orthopnea and leg swelling.  Gastrointestinal: Negative for abdominal pain, diarrhea and vomiting.  Genitourinary: Negative for dysuria and urgency.  Musculoskeletal: Negative for myalgias and neck pain.  Skin: Negative for rash.  Neurological: Positive for weakness. Negative for dizziness, focal weakness, seizures and headaches.  Psychiatric/Behavioral: Negative for memory loss. The patient does not have insomnia.     Exam: Physical Exam  HENT:  Head: Atraumatic.  Nose: No mucosal edema.  Mouth/Throat: No oropharyngeal exudate or posterior oropharyngeal edema.  Eyes: Pupils are equal, round, and reactive to light. Lids are normal.  Neck: Carotid bruit is not present. No thyromegaly present.  Cardiovascular: Regular rhythm, S1 normal, S2 normal and normal heart sounds.   Respiratory: She has no decreased breath sounds. She has no wheezes. She has no rales.  GI: Soft. Bowel sounds are normal. There is no tenderness.  Musculoskeletal:       Right ankle: She exhibits swelling.       Left ankle: She exhibits swelling.  Lymphadenopathy:    She  has no cervical adenopathy.  Skin: Skin is warm. No cyanosis.  Psychiatric: Her behavior is normal.      Data Reviewed: Basic Metabolic Panel:  Recent Labs Lab 04/27/17 0415 04/27/17 1730 04/28/17 0507 04/30/17 0501 05/01/17 0613 05/02/17 0900  NA 138  --  138 137 137 140  K 4.7  --  3.5 4.6 3.8 4.6  CL 100*  --  96* 98* 96* 98*  CO2 25  --  30 26 27 28   GLUCOSE 77  --  79 66 95 74  BUN 76*  --  38* 74* 47* 72*  CREATININE 1.96*  --  1.41* 3.01* 2.09* 3.08*  CALCIUM 7.7*  --  7.8* 7.9* 8.4* 8.7*  PHOS 7.4* 4.5  --  7.8*  --  6.9*   Liver Function Tests: CBC:  Recent Labs Lab 04/27/17 0415 04/28/17 0507 05/01/17 0613 05/02/17 0900  WBC 8.4 8.4 8.8 10.7  NEUTROABS 5.9  --   --   --   HGB 8.3* 8.4* 8.9* 8.9*  HCT 24.6* 24.8* 26.4* 26.8*  MCV 96.4 97.8 97.6 97.8  PLT 104* 120* 127* 140*   BNP (last 3 results)  Recent Labs  04/08/17 0139  BNP 2,012.0*     CBG:  Recent Labs Lab 05/02/17 0800 05/02/17 1652 05/03/17 0308 05/03/17 0756 05/03/17 1137  GLUCAP 82 92 72 74 103*    Recent Results (from the past 240 hour(s))  Culture, blood (Routine X 2) w Reflex to ID Panel     Status: None   Collection Time: 04/26/17  7:30 AM  Result Value Ref Range Status   Specimen Description BLOOD PICC LINE  Final   Special Requests   Final    BOTTLES DRAWN AEROBIC AND ANAEROBIC Blood Culture adequate volume   Culture NO GROWTH 5 DAYS  Final   Report Status 05/01/2017 FINAL  Final  Culture, blood (Routine X 2) w Reflex to ID Panel     Status: None   Collection Time: 04/26/17  7:30 AM  Result Value Ref Range Status   Specimen Description BLOOD RIGHT HAND  Final   Special Requests   Final    BOTTLES DRAWN AEROBIC AND ANAEROBIC Blood Culture adequate volume   Culture NO GROWTH 5 DAYS  Final   Report Status 05/01/2017 FINAL  Final     Studies: No results found.  Scheduled Meds: . amiodarone  200 mg Oral BID  . collagenase   Topical Daily  . diltiazem  120  mg Oral Daily  . famotidine  20 mg Oral QHS  . feeding supplement  1 Container Oral 5 X Daily  . levothyroxine  100 mcg Oral QAC breakfast  . midodrine  10 mg Oral TID WC  . multivitamin  1 tablet Oral QHS  . QUEtiapine  25 mg Oral QHS  . sertraline  25 mg Oral QHS  . tuberculin  5 Units Intradermal Once   Continuous Infusions: . albumin human      Assessment/Plan:  # Atrial fibrillation with rapid ventricular rate Amiodarone started in ICU. Will restart Cardizem from home. Hold metoprolol due to patient's hypotension and being on midodrine. She was also on Pradaxa at home but now on hemodialysis. Discussed with Dr. Nehemiah Massed of cardiology. Monitor heart rate on telemetry.  # Acute hypoxic respiratory failure on full ventilatory support. Now extubated. On room air.   # PEA arrest secondary to septic shock. Recovered well after resuscitation through ACLS protocol.  # Septic shock secondary to lower extremity cellulitis. Off pressors. Cellulitis resolved. Antibiotics stopped.  # Shock liver secondary to septic shock, hypotension: Improving. Patient is tolerating diet.   #Acute renal failure with proteinuria, hematuria: Baseline creatinine 0.62. Patient anuric. Continue hemodialysis. Tunneled catheter in place.  # Hypertension. Started on Midodrin in the ICU. Continue.  # hypothyroidism: Continue Synthroid.  #Depression: Continue Seroquel.  # Diabetes mellitus type 2, Uncontrolled with hypoglycemia Was on D10. Now stopped. Continue Accu-Cheks.  CIR likely tomorrow  Code Status:     Code Status Orders        Start     Ordered   04/08/17 0651  Full code  Continuous     04/08/17 0650    Code Status History    Date Active Date Inactive Code Status Order ID Comments User Context   This patient has a current code status but no historical code status.      Consultants:  Critical care specialist  Infectious disease  Nephrology  Palliative  care  Cardiology  Time spent: 30 minutes.   Ronin Crager, [employer]

## 2017-05-03 NOTE — Progress Notes (Signed)
Visited with patient. In good spirits today. Tolerating dialysis. Motivated to work with PT/OT with goal of returning home after rehab. Therapeutic listening. Emotional/spiritual support provided.   NO CHARGE  Ihor Dow, FNP-C Palliative Medicine Team  Phone: [phone] Fax: [phone]

## 2017-05-03 NOTE — Progress Notes (Signed)
FSBS 72. Not flowing over from Glucometer. Pt given one apple juice. Primary nurse to continue to monitor.

## 2017-05-03 NOTE — Consult Note (Signed)
[facility] Cardiology Consultation Note  Patient ID: [name], MRN: [id number], DOB/AGE: [birth year] 66 y.o. Admit date: 04/08/2017   Date of Consult: 05/03/2017 Primary Physician: Tracie Harrier, MD Primary Cardiologist: Nehemiah Massed  Chief Complaint:  Chief Complaint  Patient presents with  . Leg Swelling   Reason for Consult: atrial fibrillation  HPI: 66 y.o. female with diabetes and paroxysmal nonvalvular atrial fibrillation for which the patient had a septic shock event and was in the intensive care unit with hypotension and and organ failure. She did recover from this slowly and has now been able to sit up. She does still continue to have hypotension for which she is on Midrin to treat this and she is relatively controlled. Her paroxysmal nonvalvular atrial fibrillation is now persistent with heart rate control with amiodarone and diltiazem. Heart rate by telemetry shows 78 bpm. The patient otherwise will need significant amount of recovery from her long period of lack of ambulation. We also have discussed the concerns of anticoagulation to reduce risk of stroke with atrial fibrillation. Currently warfarin is the primary treatment although there is a study that shows that elequis may be safe to use in patients who have chronic renal failure  Past Medical History:  Diagnosis Date  . A-fib ([place])    on pradaxa  . Hypertension   . Thyroid disease       Surgical History:  Past Surgical History:  Procedure Laterality Date  . DIALYSIS/PERMA CATHETER INSERTION N/A 04/26/2017   Procedure: Dialysis/Perma Catheter Insertion;  Surgeon: Algernon Huxley, MD;  Location: [facility];  Service: Cardiovascular;  Laterality: N/A;  . WRIST ARTHROSCOPY       Home Meds: Prior to Admission medications   Medication Sig Start Date End Date Taking? Authorizing Provider  ALPRAZolam Duanne Moron) 0.25 MG tablet Take 0.25 mg by mouth at bedtime as needed for anxiety.   Yes [provider]   dabigatran (PRADAXA) 150 MG CAPS capsule Take 150 mg by mouth 2 (two) times daily.   Yes [provider]  diltiazem (TIAZAC) 180 MG 24 hr capsule Take 180 mg by mouth 2 (two) times daily.   Yes [provider]  furosemide (LASIX) 20 MG tablet Take 20 mg by mouth.   Yes [provider]  levothyroxine (SYNTHROID, LEVOTHROID) 50 MCG tablet Take 50 mcg by mouth daily before breakfast.   Yes [provider]  metoprolol tartrate (LOPRESSOR) 50 MG tablet Take 50 mg by mouth 2 (two) times daily.   Yes [provider]  sertraline (ZOLOFT) 25 MG tablet Take 25 mg by mouth daily.   Yes [provider]    Inpatient Medications:  . amiodarone  200 mg Oral BID  . collagenase   Topical Daily  . diltiazem  120 mg Oral Daily  . famotidine  20 mg Oral QHS  . feeding supplement  1 Container Oral 5 X Daily  . levothyroxine  100 mcg Oral QAC breakfast  . midodrine  10 mg Oral TID WC  . multivitamin  1 tablet Oral QHS  . QUEtiapine  25 mg Oral QHS  . sertraline  25 mg Oral QHS  . tuberculin  5 Units Intradermal Once   . albumin human      Allergies: No Known Allergies  Social History   Social History  . Marital status: Married    Spouse name: N/A  . Number of children: N/A  . Years of education: N/A   Occupational History  . Not  on file.   Social History Main Topics  . Smoking status: Never Smoker  . Smokeless tobacco: Never Used  . Alcohol use No  . Drug use: Unknown  . Sexual activity: Not on file   Other Topics Concern  . Not on file   Social History Narrative  . No narrative on file     Family History  Problem Relation Age of Onset  . AAA (abdominal aortic aneurysm) Mother      Review of Systems Positive for Shortness of breath weakness Negative for: General:  chills, fever, night sweats or weight changes.  Cardiovascular: PND orthopnea syncope dizziness  Dermatological skin lesions rashes Respiratory: Cough  congestion Urologic: Frequent urination urination at night and hematuria Abdominal: negative for nausea, vomiting, diarrhea, bright red blood per rectum, melena, or hematemesis Neurologic: negative for visual changes, and/or hearing changes  All other systems reviewed and are otherwise negative except as noted above.  Labs: No results for input(s): CKTOTAL, CKMB, TROPONINI in the last 72 hours. Lab Results  Component Value Date   WBC 10.7 05/02/2017   HGB 8.9 (L) 05/02/2017   HCT 26.8 (L) 05/02/2017   MCV 97.8 05/02/2017   PLT 140 (L) 05/02/2017    Recent Labs Lab 04/28/17 0507  05/02/17 0900  NA 138  < > 140  K 3.5  < > 4.6  CL 96*  < > 98*  CO2 30  < > 28  BUN 38*  < > 72*  CREATININE 1.41*  < > 3.08*  CALCIUM 7.8*  < > 8.7*  PROT 5.8*  --   --   BILITOT 2.4*  --   --   ALKPHOS 235*  --   --   ALT 59*  --   --   AST 51*  --   --   GLUCOSE 79  < > 74  < > = values in this interval not displayed. No results found for: CHOL, HDL, LDLCALC, TRIG No results found for: DDIMER  Radiology/Studies:  [place] Chest  Result Date: May 02, 2017 CLINICAL DATA:  RIGHT pleural effusion EXAM: CHEST ULTRASOUND COMPARISON:  Chest radiograph 05/02/2017 FINDINGS: Small to moderate RIGHT pleural effusion identified. Patient imaged in LEFT lateral decubitus position, unable to sit. No loculations or septations identified. IMPRESSION: Small to moderate RIGHT pleural effusion. Electronically Signed   By: Lavonia Dana M.D.   On: 05-02-2017 11:20   Ct Abdomen Pelvis W Contrast  Result Date: 04/04/2017 CLINICAL DATA:  Abdominal soft tissue swelling with additional BILATERAL leg and feet swelling, blistering, history hypertension, abnormal LFTs, no longer on thyroid medication EXAM: CT ABDOMEN AND PELVIS WITH CONTRAST TECHNIQUE: Multidetector CT imaging of the abdomen and pelvis was performed using the standard protocol following bolus administration of intravenous contrast. Sagittal and coronal MPR images  reconstructed from axial data set. CONTRAST:  163mL ISOVUE-300 IOPAMIDOL (ISOVUE-300) INJECTION 61% IV. Dilute oral contrast. COMPARISON:  None FINDINGS: Lower chest: Small moderate RIGHT pleural effusion with some atelectasis and question infiltrate in RIGHT lower lobe. Compressive atelectasis at posterior RIGHT middle lobe. Mild subsegmental atelectasis LEFT lung base. Hepatobiliary: Calcified gallstone in gallbladder 8 mm diameter. No gross gallbladder wall thickening or biliary dilatation. Mild focal fatty infiltration of liver adjacent to falciform fissure. Liver otherwise unremarkable. Pancreas: Normal appearance Spleen: Normal appearance Adrenals/Urinary Tract: Adrenal glands, kidneys, ureters, and bladder normal appearance. Stomach/Bowel: Appendix not identified. No pericecal inflammatory process. Stomach and bowel loops normal appearance. Vascular/Lymphatic: Atherosclerotic calcification aorta without aneurysm. No adenopathy. Reproductive: Uterus and  adnexa unremarkable Other: No free air or free fluid. Scattered subcutaneous edema at the anterior abdominal wall of the mid to lower abdomen extending into the upper thighs greater on RIGHT. No hernia. Musculoskeletal: Mild osseous demineralization. Degenerative disc disease changes lumbar spine greatest at L3-L4. IMPRESSION: Moderate RIGHT pleural effusion with atelectasis of RIGHT lung base, cannot exclude consolidation in RIGHT lower lobe. Cholelithiasis without definite evidence of cholecystitis though if this is a clinical concern, consider ultrasound. Scattered subcutaneous edema at the anterior abdominal wall extending into the upper thighs greater on RIGHT, nonspecific. Aortic Atherosclerosis (ICD10-I70.0). Electronically Signed   By: Lavonia Dana M.D.   On: 04/04/2017 11:34   US Renal  Result Date: 04/08/2017 CLINICAL DATA:  Acute renal failure EXAM: RENAL / URINARY TRACT ULTRASOUND COMPLETE COMPARISON:  CT 04/04/2017 FINDINGS: Right Kidney: Length:  11.1 cm. Echogenicity within normal limits. No mass or hydronephrosis visualized. Left Kidney: Length: 10.9 cm. 1.9 cm cyst off the lower pole. Echogenicity within normal limits. No mass or hydronephrosis visualized. Bladder: Foley catheter in place, decompressed IMPRESSION: No acute findings. Electronically Signed   By: Rolm Baptise M.D.   On: 04/08/2017 16:18   US Venous Img Lower Bilateral  Result Date: 04/08/2017 CLINICAL DATA:  Bilateral lower extremity swelling for 2 weeks EXAM: BILATERAL LOWER EXTREMITY VENOUS DOPPLER ULTRASOUND TECHNIQUE: Gray-scale sonography with graded compression, as well as color Doppler and duplex ultrasound were performed to evaluate the lower extremity deep venous systems from the level of the common femoral vein and including the common femoral, femoral, profunda femoral, popliteal and calf veins including the posterior tibial, peroneal and gastrocnemius veins when visible. The superficial great saphenous vein was also interrogated. Spectral Doppler was utilized to evaluate flow at rest and with distal augmentation maneuvers in the common femoral, femoral and popliteal veins. COMPARISON:  None. FINDINGS: RIGHT LOWER EXTREMITY Common Femoral Vein: No evidence of thrombus. Normal compressibility, respiratory phasicity and response to augmentation. Saphenofemoral Junction: No evidence of thrombus. Normal compressibility and flow on color Doppler imaging. Profunda Femoral Vein: No evidence of thrombus. Normal compressibility and flow on color Doppler imaging. Femoral Vein: No evidence of thrombus. Normal compressibility, respiratory phasicity and response to augmentation. Popliteal Vein: No evidence of thrombus. Normal compressibility, respiratory phasicity and response to augmentation. Calf Veins: Limited evaluation due to edema. Superficial Great Saphenous Vein: No evidence of thrombus. Normal compressibility and flow on color Doppler imaging. Venous Reflux:  None. Other Findings:   None. LEFT LOWER EXTREMITY Common Femoral Vein: No evidence of thrombus. Normal compressibility, respiratory phasicity and response to augmentation. Saphenofemoral Junction: No evidence of thrombus. Normal compressibility and flow on color Doppler imaging. Profunda Femoral Vein: No evidence of thrombus. Normal compressibility and flow on color Doppler imaging. Femoral Vein: No evidence of thrombus. Normal compressibility, respiratory phasicity and response to augmentation. Popliteal Vein: No evidence of thrombus. Normal compressibility, respiratory phasicity and response to augmentation. Calf Veins: Limited evaluation due to edema. Superficial Great Saphenous Vein: No evidence of thrombus. Normal compressibility and flow on color Doppler imaging. Venous Reflux:  None. Other Findings:  None. IMPRESSION: No evidence of DVT within either lower extremity. Mild study limitations in the calf bilaterally due to edema. Electronically Signed   By: Andreas Newport M.D.   On: 04/08/2017 06:22   US Venous Img Upper Bilat  Addendum Date: 04/15/2017   ADDENDUM REPORT: 04/15/2017 13:59 ADDENDUM: The original report was by Dr. Van Clines. The following addendum is by Dr. Van Clines: Critical Value/emergent results were called  by telephone at the time of interpretation on 04/15/2017 at 1:45 pm to Dr. Jefferson Fuel , who verbally acknowledged these results. Electronically Signed   By: Van Clines M.D.   On: 04/15/2017 13:59   Result Date: 04/15/2017 CLINICAL DATA:  Bilateral upper extremity edema, right greater than left. EXAM: BILATERAL UPPER EXTREMITY VENOUS DOPPLER ULTRASOUND TECHNIQUE: Gray-scale sonography with graded compression, as well as color Doppler and duplex ultrasound were performed to evaluate the bilateral upper extremity deep venous systems from the level of the subclavian vein and including the jugular, axillary, basilic, radial, ulnar and upper cephalic vein. Spectral Doppler was utilized to  evaluate flow at rest and with distal augmentation maneuvers. COMPARISON:  None. FINDINGS: RIGHT UPPER EXTREMITY Internal Jugular Vein: No evidence of thrombus. Normal compressibility, respiratory phasicity and response to augmentation. Subclavian Vein: No evidence of thrombus. Normal compressibility, respiratory phasicity and response to augmentation. Axillary Vein: No evidence of thrombus. Normal compressibility, respiratory phasicity and response to augmentation. Cephalic Vein: No evidence of thrombus. Normal compressibility, respiratory phasicity and response to augmentation. Basilic Vein: No evidence of thrombus. Normal compressibility, respiratory phasicity and response to augmentation. Brachial Veins: No evidence of thrombus. Normal compressibility, respiratory phasicity and response to augmentation. Radial Veins: No evidence of thrombus. Normal compressibility, respiratory phasicity and response to augmentation. Ulnar Veins: No evidence of thrombus. Normal compressibility, respiratory phasicity and response to augmentation. Venous Reflux:  None. Other Findings:  None. LEFT UPPER EXTREMITY Internal Jugular Vein: No evidence of thrombus. Normal compressibility, respiratory phasicity and response to augmentation. Subclavian Vein: No evidence of thrombus. Normal compressibility, respiratory phasicity and response to augmentation. Axillary Vein: No evidence of thrombus. Normal compressibility, respiratory phasicity and response to augmentation. Cephalic Vein: Occlusive thrombus in the left cephalic vein extending from the elbow region/distal upper arm to the mid humeral region. Basilic Vein: No evidence of thrombus. Normal compressibility, respiratory phasicity and response to augmentation. Brachial Veins: No evidence of thrombus. Normal compressibility, respiratory phasicity and response to augmentation. Radial Veins: No evidence of thrombus. Normal compressibility, respiratory phasicity and response to  augmentation. Ulnar Veins: No evidence of thrombus. Normal compressibility, respiratory phasicity and response to augmentation. Venous Reflux:  None. Other Findings:  None. IMPRESSION: 1. Occlusive deep vein thrombosis in the left cephalic vein extending from the mid humeral region down towards the elbow. No DVT in the right upper extremity. Radiology assistant personnel have been notified to put me in telephone contact with the referring physician or the referring physician's clinical representative in order to discuss these findings. Once this communication is established I will issue an addendum to this report for documentation purposes. Electronically Signed: By: Van Clines M.D. On: 04/15/2017 13:37   Dg Chest Port 1 View  Result Date: 04/21/2017 CLINICAL DATA:  Respiratory failure.  Followup exam. EXAM: PORTABLE CHEST 1 VIEW COMPARISON:  04/19/2017 FINDINGS: Has been mild improvement in lung aeration since the previous exam with improved lung opacity most evident in the mid lungs. Persistent lower lung zone opacity obscures hemidiaphragms, greater on the right, likely combination of pleural effusions with atelectasis, pneumonia or a combination. There is no evidence of pulmonary edema. No pneumothorax. New enteric feeding tube passes below the diaphragm and below the included field of view. Bilateral internal jugular central venous lines are stable and well positioned. IMPRESSION: 1. Mild improvement in lung aeration since the previous exam. 2. New enteric feeding tube passes below the diaphragm, not completely visualized on this exam. Electronically Signed   By: Dedra Skeens.D.  On: 04/21/2017 08:05   Dg Chest Port 1 View  Result Date: 04/19/2017 CLINICAL DATA:  Catheter placement EXAM: PORTABLE CHEST 1 VIEW COMPARISON:  04/17/2017 FINDINGS: 1236 hours. New right IJ central line is identified with tip positioned over the distal SVC near the RA junction. No evidence for right pneumothorax.  Left IJ central line tip also projects distal SVC level. The cardio pericardial silhouette is enlarged. Bibasilar collapse/consolidation similar to prior with vascular congestion and features suggesting pulmonary edema. IMPRESSION: 1. New right IJ central line tip overlies the distal SVC level near the junction with the RA. No evidence for pneumothorax. 2. Cardiomegaly with vascular congestion and pulmonary edema. 3. Bibasilar collapse/consolidation with probable small bilateral pleural effusions. Electronically Signed   By: Misty Stanley M.D.   On: 04/19/2017 13:04   Dg Chest Port 1 View  Result Date: 04/17/2017 CLINICAL DATA:  Respiratory failure EXAM: PORTABLE CHEST 1 VIEW COMPARISON:  April 14, 2017 FINDINGS: Central catheter tip is in the superior vena cava. Endotracheal tube and nasogastric tube have been removed. No pneumothorax. There is interstitial pulmonary edema with pleural effusions bilaterally. There is patchy airspace consolidation in the left lower lobe. Heart is enlarged with mild pulmonary venous hypertension. No appreciable adenopathy. No bone lesions. IMPRESSION: No pneumothorax. Central catheter position unchanged. Evidence of underlying congestive heart failure. Superimposed pneumonia in the left lower lobe cannot be excluded radiographically. There appears to be slight increase in opacity in the left base compared to recent study. Electronically Signed   By: Lowella Grip III M.D.   On: 04/17/2017 07:07   Dg Chest Port 1 View  Result Date: 04/14/2017 CLINICAL DATA:  Initial evaluation for acute respiratory failure. EXAM: PORTABLE CHEST 1 VIEW COMPARISON:  Prior radiograph from 04/13/2017. FINDINGS: Endotracheal tube in place with tip positioned 2.6 cm above the carina. Enteric tube courses in the the abdomen. Left IJ approach central venous catheter in place with tip overlying the distal SVC. Stable cardiomegaly. Mediastinal silhouette normal. Lungs hypoinflated. Persistent perihilar  vascular congestion with interstitial prominence, suggesting underlying pulmonary edema. Superimposed bibasilar opacities, greatest within the mid and lower right lung, similar to previous. Persistent right pleural effusion. No pneumothorax. No acute osseous abnormality. IMPRESSION: 1. Support apparatus in satisfactory position as above. 2. Stable cardiomegaly with similar diffuse bilateral pulmonary infiltrates and/or edema and small right pleural effusion. No significant interval change. Electronically Signed   By: Jeannine Boga M.D.   On: 04/14/2017 06:28   Dg Chest Port 1 View  Result Date: 04/13/2017 CLINICAL DATA:  Acute respiratory failure. EXAM: PORTABLE CHEST 1 VIEW COMPARISON:  04/12/2017 FINDINGS: Support apparatus is stable. The cardiac silhouette is enlarged. Mediastinal contours appear intact. There is no evidence of pneumothorax. Mixed pattern pulmonary edema versus pulmonary infiltrates have slightly improved. There is a stable right pleural effusion. Possible small left pleural effusion. Osseous structures are without acute abnormality. Soft tissues are grossly normal. IMPRESSION: Stable support apparatus. Slight improvement in mixed pattern pulmonary edema versus infiltrates in bilateral lungs. Probable bilateral pleural effusions. Electronically Signed   By: Fidela Salisbury M.D.   On: 04/13/2017 10:08   Dg Chest Port 1 View  Result Date: 04/12/2017 CLINICAL DATA:  Respiratory failure. EXAM: PORTABLE CHEST 1 VIEW COMPARISON:  04/11/2017. FINDINGS: Endotracheal tube, left IJ line, NG tube in stable position. Cardiomegaly with prominent diffuse bilateral pulmonary infiltrates/ edema again noted without interim change. Small right pleural effusion again noted. No pneumothorax. IMPRESSION: 1.  Lines and tubes stable position. 2. Cardiomegaly  with prominent diffuse bilateral pulmonary infiltrates/ edema and small right pleural effusion. No significant interim change. Electronically  Signed   By: Marcello Moores  Register   On: 04/12/2017 06:30   Dg Chest Port 1 View  Result Date: 04/11/2017 CLINICAL DATA:  Post intubation and NG tube placement. EXAM: PORTABLE CHEST 1 VIEW COMPARISON:  04/10/2017 FINDINGS: 1733 hours. Endotracheal tube tip is 3.2 cm above the base of the carina. Left IJ central line tip overlies the distal SVC region. The NG tube passes into the stomach although the distal tip position is not included on the film. The cardio pericardial silhouette is enlarged. Interval progression of diffuse interstitial and bilateral airspace disease. Persistent small right pleural effusion. Defibrillator pads overlie the chest. IMPRESSION: 1. Support apparatus appears appropriately positioned. 2. Bilateral interstitial and airspace disease compatible with diffuse pneumonia or asymmetric pulmonary edema. 3. Small right pleural effusion. Electronically Signed   By: Misty Stanley M.D.   On: 04/11/2017 17:44   Dg Chest Port 1 View  Result Date: 04/10/2017 CLINICAL DATA:  Respiratory failure. EXAM: PORTABLE CHEST 1 VIEW COMPARISON:  04/09/2017 . FINDINGS: Cardiomegaly with pulmonary vascular congestion and mild interstitial edema again noted. Bibasilar atelectasis. Bibasilar pneumonia cannot be excluded. Small bilateral pleural effusions again noted. No pneumothorax . IMPRESSION: 1. Cardiomegaly with mild pulmonary venous congestion interstitial edema. Small bilateral pleural effusions. No change. 2. Low lung volumes with bibasilar atelectasis. Bibasilar pneumonia cannot be excluded. Chest is unchanged from prior exam. Electronically Signed   By: Marcello Moores  Register   On: 04/10/2017 06:58   Dg Chest Port 1 View  Result Date: 04/09/2017 CLINICAL DATA:  Sepsis. EXAM: PORTABLE CHEST 1 VIEW COMPARISON:  04/08/2017. FINDINGS: Cardiomegaly with persistent pulmonary venous congestion with mild basilar interstitial edema and small bilateral pleural effusions. Basilar atelectasis. IMPRESSION: 1. Persistent  cardiomegaly with mild pulmonary venous congestion and basilar interstitial edema. Small bilateral pleural effusions. 2. Persistent bibasilar atelectasis. Bibasilar pneumonia cannot be excluded . Electronically Signed   By: Marcello Moores  Register   On: 04/09/2017 06:42   Dg Chest Port 1 View  Result Date: 04/08/2017 CLINICAL DATA:  66 y/o  F; bilateral lower extremity edema. EXAM: PORTABLE CHEST 1 VIEW COMPARISON:  09/18/2014 chest radiograph FINDINGS: Moderate cardiomegaly. Pulmonary venous hypertension. Moderate right pleural effusion and right basilar opacity. No acute osseous abnormality is evident. IMPRESSION: Cardiomegaly. Pulmonary venous hypertension. Moderate right pleural effusion. Right basilar opacity may represent atelectasis or pneumonia. Electronically Signed   By: Kristine Garbe M.D.   On: 04/08/2017 02:20   Dg Abd Portable 1v  Result Date: 04/19/2017 CLINICAL DATA:  Feeding tube placement EXAM: PORTABLE ABDOMEN - 1 VIEW COMPARISON:  04/11/2017 FINDINGS: Feeding tube is in place with the tip in the mid stomach. Right groin dialysis catheter in place with the tip overlying the L3 vertebral body. Nonobstructive bowel gas pattern. IMPRESSION: Feeding tube tip in the mid stomach Electronically Signed   By: Rolm Baptise M.D.   On: 04/19/2017 16:02   Dg Abd Portable 1v  Result Date: 04/11/2017 CLINICAL DATA:  Intubation. EXAM: PORTABLE ABDOMEN - 1 VIEW COMPARISON:  CT from 04/04/2017 FINDINGS: Nasogastric tube with tip in the low stomach. The stomach is moderately gas distended. Rectal tube is also present. Right femoral catheter with tip at the L2-3 disc level. Amorphous density over the right iliac fossa is likely enteric. No calcifications seen in this area on recent CT. Questionable fold thickening of pelvic bowel loops. Bowel gas pattern is no overall nonobstructive. IMPRESSION: 1.  Nasogastric tube tip is at the distal stomach. 2. Moderate gaseous distention of the stomach. 3. Right  femoral venous catheter with tip at the L2-3 disc level. Electronically Signed   By: Monte Fantasia M.D.   On: 04/11/2017 18:07    EKG: Atrial fibrillation with controlled ventricular rate  Weights: Filed Weights   04/30/17 1155 05/02/17 0919 05/03/17 0500  Weight: 77.2 kg (170 lb 3.1 oz) 75.5 kg (166 lb 7.2 oz) 75.8 kg (167 lb)     Physical Exam: Blood pressure 105/71, pulse (!) 116, temperature 98.2 F (36.8 C), temperature source Oral, resp. rate 17, height 5\' 5"  (1.651 m), weight 75.8 kg (167 lb), SpO2 96 %. Body mass index is 27.79 kg/m. General: Well developed, well nourished, in no acute distress. Head eyes ears nose throat: Normocephalic, atraumatic, sclera non-icteric, no xanthomas, nares are without discharge. No apparent thyromegaly and/or mass  Lungs: Normal respiratory effort.  no wheezes, no rales, no rhonchi.  Heart: Irregular with normal S1 S2. no murmur gallop, no rub, PMI is normal size and placement, carotid upstroke normal without bruit, jugular venous pressure is normal Abdomen: Soft, non-tender, non-distended with normoactive bowel sounds. No hepatomegaly. No rebound/guarding. No obvious abdominal masses. Abdominal aorta is normal size without bruit Extremities: No edema. no cyanosis, no clubbing, no ulcers  Peripheral : 2+ bilateral upper extremity pulses, 2+ bilateral femoral pulses, 2+ bilateral dorsal pedal pulse Neuro: Alert and oriented. No facial asymmetry. No focal deficit. Moves all extremities spontaneously. Musculoskeletal: Normal muscle tone without kyphosis Psych:  Responds to questions appropriately with a normal affect.    Assessment: 66 year old female with paroxysmal nonvalvular atrial fibrillation now persistent with good heart rate control on current medical regimen 80 further treatment  Plan: 1. Continue amiodarone and diltiazem for heart rate control of atrial fibrillation 2. Further consideration of either warfarin with a goal INR between  2 and 3 and/or elequis low-dose 2.5 mg twice per day for risk reduction in stroke with atrial fibrillation 3. No further cardiac diagnostics necessary at this time 4. Begin ambulation and no restrictions to physical rehabilitation  Signed, Corey Skains M.D. [facility] 05/03/2017, 5:03 PM

## 2017-05-03 NOTE — Progress Notes (Addendum)
Inpatient Rehabilitation  Late entry  Met with patient and spouse at bedside yesterday to discuss team's recommendation for IP Rehab.  Shared booklets and answered questions.  They are eager for her to return home as independent as possible.  Plan to clarify progress regarding set-up of outpatient HD, and when to initiate insurance authorization.  Will follow for timing of medical readiness, insurance authorization, and bed availability.  Please call with questions.   Carmelia Roller., CCC/SLP Admission Coordinator  [facility]  Cell [phone]

## 2017-05-03 NOTE — PMR Pre-admission (Signed)
[facility] PMR Admission Coordinator Pre-Admission Assessment  Patient: [name] is an 66 y.o., female MRN: [id number] DOB: 08-16-1951 Height: '5\' 5"'  (165.1 cm) Weight: 75.3 kg (166 lb)  Insurance Information HMO:     PPO:      PCP:      IPA:      80/20:      OTHER: Georgina Pillion PRIMARY: UHC Medicare       Policy#: [id number]      Subscriber: Self CM Name: Vevelyn Royals      Phone#: [phone]     Fax#: [phone] Pre-Cert#: [id number]      Employer: Retired  Benefits:  Phone #: verified online      Name:  Irene Shipper. Date: 02/06/17     Deduct: $0      Out of Pocket Max: $4,000      Life Max: N/A CIR: $160 a day, days 1-10; $0 a day, days 11+      SNF: $0 a day, days 1-20; $50 a day days 21-100 Outpatient: PT/OT/SLP     Co-Pay: $20 per visit  Home Health: PT/OT/SLP 100%      Co-Pay: None DME: 80%      Co-Pay: 20% Providers: In-network   Medicaid Application Date:       Case Manager:  Disability Application Date:       Case Worker:   Emergency Facilities manager Information    Name Relation Home Work Mobile   Hayden,[name] Spouse [phone]        Current Medical History  Patient Admitting Diagnosis: Critical illness myopathy   History of Present Illness: [name] is a 66 y.o. white female with hypertension, hypothyroidism, atrial fibrillation, who was admitted to [facility] on 7/1/2018for evaluation of increasing lower extremity edema. She was found to have cellulitis upon admission. Started on CRRT for hypotension and anuric renal failure on 7/2. Patient weaned off CRRT on 7/4. However later that day, patient had cardiac arrest and coded. Intubated, sedated and restarted on three vasopressors. Restart on CRRT on 7/4. Now transitioned to intermittent hemodialysis M,W,F, with outpatient set-up with [facility] in [place] for T,Th,Sat. Hypotension with atrial fibrillation with rapid ventricular response and anasarca. 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 Echocardiogram  with EF of 25-30%.  Midodrine to help sustain blood pressure and IV albumin with HD treatment as needed. 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  Therapies initiated with recommendation for follow-up CIR level of therapies.  Patient with significant debility impacting her ability to complete basic self-care tasks following her multiple medical issues.  Patient admitted to [facility] IP Rehab 05/07/17.   Patient's medical record from [facility] has been reviewed by the rehabilitation admission coordinator and physician.   Past Medical History  Past Medical History:  Diagnosis Date  . A-fib ([place])    on pradaxa  . Hypertension   . Thyroid disease     Family History   family history includes AAA (abdominal aortic aneurysm) in her mother.  Prior Rehab/Hospitalizations Has the patient had major surgery during 100 days prior to admission? No   Current Medications Refer to [facility]  Patients Current Diet:  Renal diet restrictions   Precautions / Restrictions Precautions Precautions: Fall Precaution Comments: R IJ perm cath Restrictions Weight Bearing Restrictions: No   Has the patient had 2 or more falls or a fall with injury in the past year?No  Prior Activity Level  Community (5-7x/wk): Prior to admission patient was fully independent and active. She enjoyed shopping, the beach, and visiting with friends and family.   Prior Functional Level Self Care: Did the patient need help bathing, dressing, using the toilet or eating? Independent  Indoor Mobility: Did the patient need assistance with walking from room to room (with or without device)? Independent  Stairs: Did the patient need assistance with internal or external stairs (with or without device)? Independent  Functional Cognition: Did the patient need help planning regular tasks such as shopping or remembering to take  medications? Independent  Home Assistive Devices / Equipment Home Assistive Devices/Equipment: None Home Equipment: None  Prior Device Use: Indicate devices/aids used by the patient prior to current illness, exacerbation or injury? None of the above   Prior Functional Level Current Functional Level  Bed Mobility  Independent   Supine to sit Mod-Max A; Sit to supine Max-Total A   Transfers  Independent   Sit to stand Max A +2   Mobility - Walk/Wheelchair  Independent   Not yet assessed    Upper Body Dressing  Independent   Max A   Lower Body Dressing  Independent   MaxTR A   Grooming  Independent   Supervision bed level    Eating/Drinking  Independent   Supervision bed level    Toilet Transfer  Independent   Max A +2   Bladder Continence   Yes   Continent    Bowel Management  Continent    Incontinent    Stair Climbing  Independent    Not yet assessed    Communication  Independent    Mod I    Memory  Independent    Mod I    Cooking/Meal Prep  Independent       Housework  Independent     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    Driving  Yes, Independent       Special needs/care consideration BiPAP/CPAP: No CPM: No Continuous Drip IV: No Dialysis: Yes, new to HD        Days: M,W,F (currently)    Outpatient: HD outpatient set-up with [facility] in [place] T,Th,Sat 6:40 am contact is Elvera Bicker [phone]  Life Vest:No Oxygen: None Special Bed: No Trach Size: No Wound Vac (area): No       Skin: Bruising right neck, abdomen; Cellulitis and blisters bilateral lower legs and feet; Moisture associated skin damage, stage II bilateral buttocks                              Bowel mgmt: Incontinent, last BM 05/06/17  Bladder mgmt: Continent  Diabetic mgmt: No, monitoring for hypoglycemia   Previous Home Environment Living Arrangements: Spouse/significant other  Lives With: Spouse Available Help at  Discharge: Family, Available 24 hours/day Type of Home: House Home Layout: One level Home Access: Stairs to enter Entrance Stairs-Rails: Right Entrance Stairs-Number of Steps: 4 Bathroom Shower/Tub: Multimedia programmer: Standard Bathroom Accessibility: Yes How Accessible: Accessible via walker [place]: No Additional Comments: N/A  Discharge Living Setting Plans for Discharge Living Setting: Patient's home, Lives with (comment) Raechel Chute, Guiselle Mian [phone]) Type of Home at Discharge: House Discharge Home Layout: One level Discharge Home Access: Stairs to enter Entrance Stairs-Rails: Right, Left, Can reach both Entrance Stairs-Number of Steps: 3-4 Discharge Bathroom Shower/Tub: Tub/shower unit, Walk-in shower (both are available ) Discharge Bathroom Toilet: Standard Discharge Bathroom  Accessibility: Yes How Accessible: Accessible via walker Does the patient have any problems obtaining your medications?: No  Social/Family/Support Systems Patient Roles: Spouse, Parent, Other (Comment) (friend and family member ) Contact Information: Spouse Lynnsey Barbara [phone] Anticipated Caregiver: Spouse  Anticipated Caregiver's Contact Information: see above  Ability/Limitations of Caregiver: he works part time but will arrange care/provide 24/7 Caregiver Availability: 24/7 Discharge Plan Discussed with Primary Caregiver: Yes Is Caregiver In Agreement with Plan?: Yes Does Caregiver/Family have Issues with Lodging/Transportation while Pt is in Rehab?: No  Goals/Additional Needs Patient/Family Goal for Rehab: PT/OT Min A Expected length of stay: 19-21 days  Cultural Considerations: Christian  Dietary Needs: Renal diet restrictions  Equipment Needs: TBD Special Service Needs: New HD patient and will need renal diet education with patient and spouse  Additional Information: Patient and spouse are from [facility] and [facility] Pt/Family Agrees to Admission and  willing to participate: Yes Program Orientation Provided & Reviewed with Pt/Caregiver Including Roles  & Responsibilities: Yes Additional Information Needs: Dietician consult  Information Needs to be Provided By: Medical team   Patient Condition: I have reviewed patient's medical record and met with the patient and her spouse at the bedside. Patient is eager to get to IP Rehab to facilitate her safe transition home. Given that this patient was fully independent and active prior to admission.  Currently she is requiring Mod-Total A for basic self-care task.  As a result, she makes an excellent candidate for an IP Rehab admission.  Additionally, she has skin break down and is requiring HD as a result for her critical illness and she would greatly benefit from the close medical management.  The coordinated care of the IP Rehab team will provide her with 3 hours of therapy 5/7 days of the week, as well as 24/7 skilled nursing care and daily doctors visits for management of her new medical conditions.  Plan to proceed with admission today, 05/07/17.  Preadmission Screen Completed By:  Gunnar Fusi, 05/07/2017 10:21 AM ______________________________________________________________________   Discussed status with Dr. Posey Pronto on 05/07/17 at 62 and received telephone approval for admission today.  Admission Coordinator:  Gunnar Fusi, time 1150/Date 05/07/17   Assessment/Plan: Diagnosis: Critical illness myopathy  1. Does the need for close, 24 hr/day  Medical supervision in concert with the patient's rehab needs make it unreasonable for this patient to be served in a less intensive setting? Yes  2. Co-Morbidities requiring supervision/potential complications: hypertension, hypothyroidism, atrial fibrillation, 3. Due to safety, disease management and patient education, does the patient require 24 hr/day rehab nursing? Yes 4. Does the patient require coordinated care of a physician, rehab nurse, PT (1-2  hrs/day, 5 days/week) and OT (1-2 hrs/day, 5 days/week) to address physical and functional deficits in the context of the above medical diagnosis(es)? Yes Addressing deficits in the following areas: balance, endurance, locomotion, strength, transferring, bathing, dressing, toileting and psychosocial support 5. Can the patient actively participate in an intensive therapy program of at least 3 hrs of therapy 5 days a week? Yes 6. The potential for patient to make measurable gains while on inpatient rehab is excellent 7. Anticipated functional outcomes upon discharge from inpatients are: min assist PT, min assist and mod assist OT, n/a SLP 8. Estimated rehab length of stay to reach the above functional goals is: 18-22 days. 9. Does the patient have adequate social supports to accommodate these discharge functional goals? Potentially 10. Anticipated D/C setting: Other 11. Anticipated post D/C treatments: HH therapy and Home excercise program 12.  Overall Rehab/Functional Prognosis: good    RECOMMENDATIONS: This patient's condition is appropriate for continued rehabilitative care in the following setting: CIR Patient has agreed to participate in recommended program. Yes Note that insurance prior authorization may be required for reimbursement for recommended care.  Comment:Rehab Admissions Coordinator to follow up.  Delice Lesch, MD, Mellody Drown Gunnar Fusi 05/07/2017

## 2017-05-04 LAB — GLUCOSE, CAPILLARY
Glucose-Capillary: 73 mg/dL (ref 65–99)
Glucose-Capillary: 101 mg/dL — ABNORMAL HIGH (ref 65–99)
Glucose-Capillary: 104 mg/dL — ABNORMAL HIGH (ref 65–99)

## 2017-05-04 LAB — RENAL FUNCTION PANEL
Albumin: 3.4 g/dL — ABNORMAL LOW (ref 3.5–5.0)
Anion gap: 13 (ref 5–15)
BUN: 55 mg/dL — AB (ref 6–20)
CHLORIDE: 99 mmol/L — AB (ref 101–111)
CO2: 28 mmol/L (ref 22–32)
Calcium: 8.6 mg/dL — ABNORMAL LOW (ref 8.9–10.3)
Creatinine, Ser: 2.86 mg/dL — ABNORMAL HIGH (ref 0.44–1.00)
GFR calc Af Amer: 19 mL/min — ABNORMAL LOW (ref >60)
GFR, EST NON AFRICAN AMERICAN: 16 mL/min — AB (ref >60)
GLUCOSE: 154 mg/dL — AB (ref 65–99)
Phosphorus: 4.6 mg/dL (ref 2.5–4.6)
Potassium: 3.8 mmol/L (ref 3.5–5.1)
Sodium: 140 mmol/L (ref 135–145)

## 2017-05-04 LAB — CBC
HCT: 26 % — ABNORMAL LOW (ref 35.0–47.0)
Hemoglobin: 8.6 g/dL — ABNORMAL LOW (ref 12.0–16.0)
MCH: 32.4 pg (ref 26.0–34.0)
MCHC: 33 g/dL (ref 32.0–36.0)
MCV: 98.2 fL (ref 80.0–100.0)
PLATELETS: 184 10*3/uL (ref 150–440)
RBC: 2.65 MIL/uL — ABNORMAL LOW (ref 3.80–5.20)
RDW: 22.6 % — AB (ref 11.5–14.5)
WBC: 7.8 10*3/uL (ref 3.6–11.0)

## 2017-05-04 MED ORDER — MIDODRINE HCL 5 MG PO TABS
5.0000 mg | ORAL_TABLET | Freq: Three times a day (TID) | ORAL | Status: DC
  Administered 2017-05-04 – 2017-05-07 (×10): 5 mg via ORAL
  Filled 2017-05-04 (×11): qty 1

## 2017-05-04 MED ORDER — EPOETIN ALFA 10000 UNIT/ML IJ SOLN
10000.0000 [IU] | INTRAMUSCULAR | Status: DC
  Administered 2017-05-04 – 2017-05-07 (×2): 10000 [IU] via INTRAVENOUS
  Filled 2017-05-04: qty 1

## 2017-05-04 MED ORDER — APIXABAN 2.5 MG PO TABS
2.5000 mg | ORAL_TABLET | Freq: Two times a day (BID) | ORAL | Status: DC
  Administered 2017-05-04 – 2017-05-07 (×7): 2.5 mg via ORAL
  Filled 2017-05-04 (×7): qty 1

## 2017-05-04 NOTE — Progress Notes (Signed)
PRE DIALYSIS ASSESSEMNT  

## 2017-05-04 NOTE — Progress Notes (Signed)
Inpatient Rehabilitation  Discussed set-up of HD with Elvera Bicker, who confirmed that [facility] in [place] has accepted patient.  I am continuing to follow for improved rate control and insurance authorization.  Working toward and hopeful for admit Monday.  Please call with questions.   Carmelia Roller., CCC/SLP Admission Coordinator  [facility]  Cell [phone]

## 2017-05-04 NOTE — Care Management Important Message (Signed)
Important Message  Patient Details  Name: [name] MRN: [id number] Date of Birth: May 01, 1951   Medicare Important Message Given:  Yes    Beverly Sessions, RN 05/04/2017, 11:29 AM

## 2017-05-04 NOTE — Progress Notes (Signed)
   05/04/17 1400  PPD Results  Does patient have an induration at the injection site? No

## 2017-05-04 NOTE — Progress Notes (Signed)
OT Cancellation Note  Patient Details Name: [name] MRN: [id number] DOB: 1950-11-16   Cancelled Treatment:    Reason Eval/Treat Not Completed: Patient at procedure or test/ unavailable (Pt. at Dialysis. )  Harrel Carina, MS, OTR/L 05/04/2017, 12:34 PM

## 2017-05-04 NOTE — Progress Notes (Signed)
POST DIALYSIS ASSESSMENT 

## 2017-05-04 NOTE — Progress Notes (Signed)
HD STARTED  

## 2017-05-04 NOTE — Progress Notes (Signed)
CH made a follow up visit with pt. Pt was not in the Rm at the time of this visit. RN states pt is in dialysis. CH to follow up pt as needed this afternoon.     05/04/17 1300  Clinical Encounter Type  Visited With Patient  Visit Type Follow-up;Spiritual support  Referral From Chaplain  Consult/Referral To Chaplain  Spiritual Encounters  Spiritual Needs Prayer;Other (Comment)

## 2017-05-04 NOTE — Progress Notes (Signed)
Physical Therapy Treatment Patient Details Name: [name] MRN: [id number] DOB: 10/21/1950 Today's Date: 05/04/2017    History of Present Illness 66yo female pt presented to ER on 7/1 secondary to LE swelling, difficulty walking; admitted with sepsis due to LE cellulitis.  Patient hospital course complicated by PEA arrest requiring ACLS with subsequent intubation (7/4-7/9), CRRT 7/2-7/18 with transition to HD (R IJ perm cath).    PT Comments    Able to achieve full, neutral stance (max assist +2 from maximally elevated bed surface) and maintain 45-60 seconds per trial, requiring seated rest break after each trial.  Optimal performance with bilat UEs draped around therapist shoulders, as patient lacks UE strength to generate extension to support trunk. Patient very eager for upcoming transfer to CIF; pleased with progress towards goals (tearful at times during session due to happiness over performance).    Follow Up Recommendations  CIR     Equipment Recommendations  Rolling walker with 5" wheels    Recommendations for Other Services       Precautions / Restrictions Precautions Precautions: Fall Precaution Comments: R IJ perm cath Restrictions Weight Bearing Restrictions: No    Mobility  Bed Mobility Overal bed mobility: Needs Assistance Bed Mobility: Supine to Sit;Sit to Supine     Supine to sit: Mod assist;Max assist Sit to supine: Max assist;Total assist      Transfers Overall transfer level: Needs assistance Equipment used: Rolling walker (2 wheeled) Transfers: Sit to/from Stand Sit to Stand: Max assist;+2 physical assistance            Ambulation/Gait             General Gait Details: unsafe/unable   Stairs            Wheelchair Mobility    Modified Rankin (Stroke Patients Only)       Balance Overall balance assessment: Needs assistance Sitting-balance support: No upper extremity supported;Feet supported Sitting balance-Leahy  Scale: Good Sitting balance - Comments: maintains neutral sitting posture, improving ability to shift outside immediate BOS without LOB or assist required for return to upright   Standing balance support: Bilateral upper extremity supported Standing balance-Leahy Scale: Zero                              Cognition Arousal/Alertness: Awake/alert Behavior During Therapy: WFL for tasks assessed/performed Overall Cognitive Status: Within Functional Limits for tasks assessed                                        Exercises Other Exercises Other Exercises: Sit/stand x3 additional trials from maximally elevated bed surface, max assist + for lift off, stabiliation of bilat hip/knee extension, post pelvic tilt and overall postural extension. Optimal performance with patient draping UEs around therapist shoulders (lacks UE strength to generate extension needed to assist trunk).  Able to achieve full, neutral stance and maintain 45-60 seconds per trial, requiring seated rest break after each trial.    General Comments        Pertinent Vitals/Pain Pain Assessment: No/denies pain    Home Living                      Prior Function            PT Goals (current goals can now be found in the care plan section)  Acute Rehab PT Goals Patient Stated Goal: to get stronger PT Goal Formulation: With patient Time For Goal Achievement: 05/11/17 Potential to Achieve Goals: Good Progress towards PT goals: Progressing toward goals (goals updated to reflect mobility progression)    Frequency    Min 2X/week      PT Plan Current plan remains appropriate    Co-evaluation              AM-PAC PT "6 Clicks" Daily Activity  Outcome Measure  Difficulty turning over in bed (including adjusting bedclothes, sheets and blankets)?: Total Difficulty moving from lying on back to sitting on the side of the bed? : Total Difficulty sitting down on and standing up  from a chair with arms (e.g., wheelchair, bedside commode, etc,.)?: Total Help needed moving to and from a bed to chair (including a wheelchair)?: Total Help needed walking in hospital room?: Total Help needed climbing 3-5 steps with a railing? : Total 6 Click Score: 6    End of Session Equipment Utilized During Treatment: Gait belt Activity Tolerance: Patient tolerated treatment well Patient left: in bed;with bed alarm set;with call bell/phone within reach   PT Visit Diagnosis: Muscle weakness (generalized) (M62.81);Difficulty in walking, not elsewhere classified (R26.2)     Time: 4503-8882 PT Time Calculation (min) (ACUTE ONLY): 30 min  Charges:  $Therapeutic Activity: 23-37 mins                    G Codes:       Naviah Belfield H. Owens Shark, PT, DPT, NCS 05/04/17, 9:41 PM [phone]

## 2017-05-04 NOTE — Progress Notes (Signed)
This note also relates to the following rows which could not be included: Pulse Rate - Cannot attach notes to unvalidated device data Resp - Cannot attach notes to unvalidated device data BP - Cannot attach notes to unvalidated device data  HD COMPLETED  

## 2017-05-04 NOTE — Progress Notes (Signed)
[facility] Encounter Note  Patient: [name] / Admit Date: 04/26/2017 / Date of Encounter: 05/04/2017, 7:51 AM   Subjective: Patient overall feeling better today. No evidence of significant rapid heart rate. Patient not ambulating well due to long hospital stay. No evidence of chest discomfort or worsening hypotension  Review of Systems: Positive for: Weakness and fatigue Negative for: Vision change, hearing change, syncope, dizziness, nausea, vomiting,diarrhea, bloody stool, stomach pain, cough, congestion, diaphoresis, urinary frequency, urinary pain,skin lesions, skin rashes Others previously listed  Objective: Telemetry: Atrial fibrillation with controlled ventricular rate Physical Exam: Blood pressure 104/65, pulse (!) 111, temperature 98.4 F (36.9 C), temperature source Oral, resp. rate 20, height 5\' 5"  (1.651 m), weight 75.8 kg (167 lb), SpO2 97 %. Body mass index is 27.79 kg/m. General: Well developed, well nourished, in no acute distress. Head: Normocephalic, atraumatic, sclera non-icteric, no xanthomas, nares are without discharge. Neck: No apparent masses Lungs: Normal respirations with no wheezes, no rhonchi, no rales , no crackles   Heart: Irregular rate and rhythm, normal S1 S2, no murmur, no rub, no gallop, PMI is normal size and placement, carotid upstroke normal without bruit, jugular venous pressure normal Abdomen: Soft, non-tender, non-distended with normoactive bowel sounds. No hepatosplenomegaly. Abdominal aorta is normal size without bruit Extremities: Trace edema, no clubbing, no cyanosis, no ulcers,  Peripheral: 2+ radial, 2+ femoral, 2+ dorsal pedal pulses Neuro: Alert and oriented. Moves all extremities spontaneously. Psych:  Responds to questions appropriately with a normal affect.   Intake/Output Summary (Last 24 hours) at 05/04/17 0751 Last data filed at 05/03/17 1747  Gross per 24 hour  Intake              240 ml  Output                 0 ml  Net              240 ml    Inpatient Medications:  . amiodarone  200 mg Oral BID  . collagenase   Topical Daily  . diltiazem  120 mg Oral Daily  . famotidine  20 mg Oral QHS  . feeding supplement  1 Container Oral 5 X Daily  . levothyroxine  100 mcg Oral QAC breakfast  . midodrine  10 mg Oral TID WC  . multivitamin  1 tablet Oral QHS  . QUEtiapine  25 mg Oral QHS  . sertraline  25 mg Oral QHS   Infusions:  . albumin human      Labs:  Recent Labs  05/02/17 0900  NA 140  K 4.6  CL 98*  CO2 28  GLUCOSE 74  BUN 72*  CREATININE 3.08*  CALCIUM 8.7*  PHOS 6.9*    Recent Labs  05/02/17 0900  ALBUMIN 3.6    Recent Labs  05/02/17 0900  WBC 10.7  HGB 8.9*  HCT 26.8*  MCV 97.8  PLT 140*   No results for input(s): CKTOTAL, CKMB, TROPONINI in the last 72 hours. Invalid input(s): POCBNP No results for input(s): HGBA1C in the last 72 hours.   Weights: Filed Weights   04/30/17 1155 05/02/17 0919 05/03/17 0500  Weight: 77.2 kg (170 lb 3.1 oz) 75.5 kg (166 lb 7.2 oz) 75.8 kg (167 lb)     Radiology/Studies:  [place] Chest  Result Date: 04-26-17 CLINICAL DATA:  RIGHT pleural effusion EXAM: CHEST ULTRASOUND COMPARISON:  Chest radiograph 04-26-2017 FINDINGS: Small to moderate RIGHT pleural effusion identified. Patient imaged in LEFT lateral decubitus  position, unable to sit. No loculations or septations identified. IMPRESSION: Small to moderate RIGHT pleural effusion. Electronically Signed   By: Lavonia Dana M.D.   On: 04/08/2017 11:20   Ct Abdomen Pelvis W Contrast  Result Date: 04/04/2017 CLINICAL DATA:  Abdominal soft tissue swelling with additional BILATERAL leg and feet swelling, blistering, history hypertension, abnormal LFTs, no longer on thyroid medication EXAM: CT ABDOMEN AND PELVIS WITH CONTRAST TECHNIQUE: Multidetector CT imaging of the abdomen and pelvis was performed using the standard protocol following bolus administration of intravenous  contrast. Sagittal and coronal MPR images reconstructed from axial data set. CONTRAST:  161mL ISOVUE-300 IOPAMIDOL (ISOVUE-300) INJECTION 61% IV. Dilute oral contrast. COMPARISON:  None FINDINGS: Lower chest: Small moderate RIGHT pleural effusion with some atelectasis and question infiltrate in RIGHT lower lobe. Compressive atelectasis at posterior RIGHT middle lobe. Mild subsegmental atelectasis LEFT lung base. Hepatobiliary: Calcified gallstone in gallbladder 8 mm diameter. No gross gallbladder wall thickening or biliary dilatation. Mild focal fatty infiltration of liver adjacent to falciform fissure. Liver otherwise unremarkable. Pancreas: Normal appearance Spleen: Normal appearance Adrenals/Urinary Tract: Adrenal glands, kidneys, ureters, and bladder normal appearance. Stomach/Bowel: Appendix not identified. No pericecal inflammatory process. Stomach and bowel loops normal appearance. Vascular/Lymphatic: Atherosclerotic calcification aorta without aneurysm. No adenopathy. Reproductive: Uterus and adnexa unremarkable Other: No free air or free fluid. Scattered subcutaneous edema at the anterior abdominal wall of the mid to lower abdomen extending into the upper thighs greater on RIGHT. No hernia. Musculoskeletal: Mild osseous demineralization. Degenerative disc disease changes lumbar spine greatest at L3-L4. IMPRESSION: Moderate RIGHT pleural effusion with atelectasis of RIGHT lung base, cannot exclude consolidation in RIGHT lower lobe. Cholelithiasis without definite evidence of cholecystitis though if this is a clinical concern, consider ultrasound. Scattered subcutaneous edema at the anterior abdominal wall extending into the upper thighs greater on RIGHT, nonspecific. Aortic Atherosclerosis (ICD10-I70.0). Electronically Signed   By: Lavonia Dana M.D.   On: 04/04/2017 11:34   US Renal  Result Date: 04/08/2017 CLINICAL DATA:  Acute renal failure EXAM: RENAL / URINARY TRACT ULTRASOUND COMPLETE COMPARISON:  CT  04/04/2017 FINDINGS: Right Kidney: Length: 11.1 cm. Echogenicity within normal limits. No mass or hydronephrosis visualized. Left Kidney: Length: 10.9 cm. 1.9 cm cyst off the lower pole. Echogenicity within normal limits. No mass or hydronephrosis visualized. Bladder: Foley catheter in place, decompressed IMPRESSION: No acute findings. Electronically Signed   By: Rolm Baptise M.D.   On: 04/08/2017 16:18   US Venous Img Lower Bilateral  Result Date: 04/08/2017 CLINICAL DATA:  Bilateral lower extremity swelling for 2 weeks EXAM: BILATERAL LOWER EXTREMITY VENOUS DOPPLER ULTRASOUND TECHNIQUE: Gray-scale sonography with graded compression, as well as color Doppler and duplex ultrasound were performed to evaluate the lower extremity deep venous systems from the level of the common femoral vein and including the common femoral, femoral, profunda femoral, popliteal and calf veins including the posterior tibial, peroneal and gastrocnemius veins when visible. The superficial great saphenous vein was also interrogated. Spectral Doppler was utilized to evaluate flow at rest and with distal augmentation maneuvers in the common femoral, femoral and popliteal veins. COMPARISON:  None. FINDINGS: RIGHT LOWER EXTREMITY Common Femoral Vein: No evidence of thrombus. Normal compressibility, respiratory phasicity and response to augmentation. Saphenofemoral Junction: No evidence of thrombus. Normal compressibility and flow on color Doppler imaging. Profunda Femoral Vein: No evidence of thrombus. Normal compressibility and flow on color Doppler imaging. Femoral Vein: No evidence of thrombus. Normal compressibility, respiratory phasicity and response to augmentation. Popliteal Vein: No evidence  of thrombus. Normal compressibility, respiratory phasicity and response to augmentation. Calf Veins: Limited evaluation due to edema. Superficial Great Saphenous Vein: No evidence of thrombus. Normal compressibility and flow on color Doppler  imaging. Venous Reflux:  None. Other Findings:  None. LEFT LOWER EXTREMITY Common Femoral Vein: No evidence of thrombus. Normal compressibility, respiratory phasicity and response to augmentation. Saphenofemoral Junction: No evidence of thrombus. Normal compressibility and flow on color Doppler imaging. Profunda Femoral Vein: No evidence of thrombus. Normal compressibility and flow on color Doppler imaging. Femoral Vein: No evidence of thrombus. Normal compressibility, respiratory phasicity and response to augmentation. Popliteal Vein: No evidence of thrombus. Normal compressibility, respiratory phasicity and response to augmentation. Calf Veins: Limited evaluation due to edema. Superficial Great Saphenous Vein: No evidence of thrombus. Normal compressibility and flow on color Doppler imaging. Venous Reflux:  None. Other Findings:  None. IMPRESSION: No evidence of DVT within either lower extremity. Mild study limitations in the calf bilaterally due to edema. Electronically Signed   By: Andreas Newport M.D.   On: 04/08/2017 06:22   US Venous Img Upper Bilat  Addendum Date: 04/15/2017   ADDENDUM REPORT: 04/15/2017 13:59 ADDENDUM: The original report was by Dr. Van Clines. The following addendum is by Dr. Van Clines: Critical Value/emergent results were called by telephone at the time of interpretation on 04/15/2017 at 1:45 pm to Dr. Jefferson Fuel , who verbally acknowledged these results. Electronically Signed   By: Van Clines M.D.   On: 04/15/2017 13:59   Result Date: 04/15/2017 CLINICAL DATA:  Bilateral upper extremity edema, right greater than left. EXAM: BILATERAL UPPER EXTREMITY VENOUS DOPPLER ULTRASOUND TECHNIQUE: Gray-scale sonography with graded compression, as well as color Doppler and duplex ultrasound were performed to evaluate the bilateral upper extremity deep venous systems from the level of the subclavian vein and including the jugular, axillary, basilic, radial, ulnar and upper  cephalic vein. Spectral Doppler was utilized to evaluate flow at rest and with distal augmentation maneuvers. COMPARISON:  None. FINDINGS: RIGHT UPPER EXTREMITY Internal Jugular Vein: No evidence of thrombus. Normal compressibility, respiratory phasicity and response to augmentation. Subclavian Vein: No evidence of thrombus. Normal compressibility, respiratory phasicity and response to augmentation. Axillary Vein: No evidence of thrombus. Normal compressibility, respiratory phasicity and response to augmentation. Cephalic Vein: No evidence of thrombus. Normal compressibility, respiratory phasicity and response to augmentation. Basilic Vein: No evidence of thrombus. Normal compressibility, respiratory phasicity and response to augmentation. Brachial Veins: No evidence of thrombus. Normal compressibility, respiratory phasicity and response to augmentation. Radial Veins: No evidence of thrombus. Normal compressibility, respiratory phasicity and response to augmentation. Ulnar Veins: No evidence of thrombus. Normal compressibility, respiratory phasicity and response to augmentation. Venous Reflux:  None. Other Findings:  None. LEFT UPPER EXTREMITY Internal Jugular Vein: No evidence of thrombus. Normal compressibility, respiratory phasicity and response to augmentation. Subclavian Vein: No evidence of thrombus. Normal compressibility, respiratory phasicity and response to augmentation. Axillary Vein: No evidence of thrombus. Normal compressibility, respiratory phasicity and response to augmentation. Cephalic Vein: Occlusive thrombus in the left cephalic vein extending from the elbow region/distal upper arm to the mid humeral region. Basilic Vein: No evidence of thrombus. Normal compressibility, respiratory phasicity and response to augmentation. Brachial Veins: No evidence of thrombus. Normal compressibility, respiratory phasicity and response to augmentation. Radial Veins: No evidence of thrombus. Normal  compressibility, respiratory phasicity and response to augmentation. Ulnar Veins: No evidence of thrombus. Normal compressibility, respiratory phasicity and response to augmentation. Venous Reflux:  None. Other Findings:  None. IMPRESSION: 1. Occlusive  deep vein thrombosis in the left cephalic vein extending from the mid humeral region down towards the elbow. No DVT in the right upper extremity. Radiology assistant personnel have been notified to put me in telephone contact with the referring physician or the referring physician's clinical representative in order to discuss these findings. Once this communication is established I will issue an addendum to this report for documentation purposes. Electronically Signed: By: Van Clines M.D. On: 04/15/2017 13:37   Dg Chest Port 1 View  Result Date: 04/21/2017 CLINICAL DATA:  Respiratory failure.  Followup exam. EXAM: PORTABLE CHEST 1 VIEW COMPARISON:  04/19/2017 FINDINGS: Has been mild improvement in lung aeration since the previous exam with improved lung opacity most evident in the mid lungs. Persistent lower lung zone opacity obscures hemidiaphragms, greater on the right, likely combination of pleural effusions with atelectasis, pneumonia or a combination. There is no evidence of pulmonary edema. No pneumothorax. New enteric feeding tube passes below the diaphragm and below the included field of view. Bilateral internal jugular central venous lines are stable and well positioned. IMPRESSION: 1. Mild improvement in lung aeration since the previous exam. 2. New enteric feeding tube passes below the diaphragm, not completely visualized on this exam. Electronically Signed   By: Lajean Manes M.D.   On: 04/21/2017 08:05   Dg Chest Port 1 View  Result Date: 04/19/2017 CLINICAL DATA:  Catheter placement EXAM: PORTABLE CHEST 1 VIEW COMPARISON:  04/17/2017 FINDINGS: 1236 hours. New right IJ central line is identified with tip positioned over the distal SVC near  the RA junction. No evidence for right pneumothorax. Left IJ central line tip also projects distal SVC level. The cardio pericardial silhouette is enlarged. Bibasilar collapse/consolidation similar to prior with vascular congestion and features suggesting pulmonary edema. IMPRESSION: 1. New right IJ central line tip overlies the distal SVC level near the junction with the RA. No evidence for pneumothorax. 2. Cardiomegaly with vascular congestion and pulmonary edema. 3. Bibasilar collapse/consolidation with probable small bilateral pleural effusions. Electronically Signed   By: Misty Stanley M.D.   On: 04/19/2017 13:04   Dg Chest Port 1 View  Result Date: 04/17/2017 CLINICAL DATA:  Respiratory failure EXAM: PORTABLE CHEST 1 VIEW COMPARISON:  April 14, 2017 FINDINGS: Central catheter tip is in the superior vena cava. Endotracheal tube and nasogastric tube have been removed. No pneumothorax. There is interstitial pulmonary edema with pleural effusions bilaterally. There is patchy airspace consolidation in the left lower lobe. Heart is enlarged with mild pulmonary venous hypertension. No appreciable adenopathy. No bone lesions. IMPRESSION: No pneumothorax. Central catheter position unchanged. Evidence of underlying congestive heart failure. Superimposed pneumonia in the left lower lobe cannot be excluded radiographically. There appears to be slight increase in opacity in the left base compared to recent study. Electronically Signed   By: Lowella Grip III M.D.   On: 04/17/2017 07:07   Dg Chest Port 1 View  Result Date: 04/14/2017 CLINICAL DATA:  Initial evaluation for acute respiratory failure. EXAM: PORTABLE CHEST 1 VIEW COMPARISON:  Prior radiograph from 04/13/2017. FINDINGS: Endotracheal tube in place with tip positioned 2.6 cm above the carina. Enteric tube courses in the the abdomen. Left IJ approach central venous catheter in place with tip overlying the distal SVC. Stable cardiomegaly. Mediastinal  silhouette normal. Lungs hypoinflated. Persistent perihilar vascular congestion with interstitial prominence, suggesting underlying pulmonary edema. Superimposed bibasilar opacities, greatest within the mid and lower right lung, similar to previous. Persistent right pleural effusion. No pneumothorax. No acute osseous  abnormality. IMPRESSION: 1. Support apparatus in satisfactory position as above. 2. Stable cardiomegaly with similar diffuse bilateral pulmonary infiltrates and/or edema and small right pleural effusion. No significant interval change. Electronically Signed   By: Jeannine Boga M.D.   On: 04/14/2017 06:28   Dg Chest Port 1 View  Result Date: 04/13/2017 CLINICAL DATA:  Acute respiratory failure. EXAM: PORTABLE CHEST 1 VIEW COMPARISON:  04/12/2017 FINDINGS: Support apparatus is stable. The cardiac silhouette is enlarged. Mediastinal contours appear intact. There is no evidence of pneumothorax. Mixed pattern pulmonary edema versus pulmonary infiltrates have slightly improved. There is a stable right pleural effusion. Possible small left pleural effusion. Osseous structures are without acute abnormality. Soft tissues are grossly normal. IMPRESSION: Stable support apparatus. Slight improvement in mixed pattern pulmonary edema versus infiltrates in bilateral lungs. Probable bilateral pleural effusions. Electronically Signed   By: Fidela Salisbury M.D.   On: 04/13/2017 10:08   Dg Chest Port 1 View  Result Date: 04/12/2017 CLINICAL DATA:  Respiratory failure. EXAM: PORTABLE CHEST 1 VIEW COMPARISON:  04/11/2017. FINDINGS: Endotracheal tube, left IJ line, NG tube in stable position. Cardiomegaly with prominent diffuse bilateral pulmonary infiltrates/ edema again noted without interim change. Small right pleural effusion again noted. No pneumothorax. IMPRESSION: 1.  Lines and tubes stable position. 2. Cardiomegaly with prominent diffuse bilateral pulmonary infiltrates/ edema and small right pleural  effusion. No significant interim change. Electronically Signed   By: Marcello Moores  Register   On: 04/12/2017 06:30   Dg Chest Port 1 View  Result Date: 04/11/2017 CLINICAL DATA:  Post intubation and NG tube placement. EXAM: PORTABLE CHEST 1 VIEW COMPARISON:  04/10/2017 FINDINGS: 1733 hours. Endotracheal tube tip is 3.2 cm above the base of the carina. Left IJ central line tip overlies the distal SVC region. The NG tube passes into the stomach although the distal tip position is not included on the film. The cardio pericardial silhouette is enlarged. Interval progression of diffuse interstitial and bilateral airspace disease. Persistent small right pleural effusion. Defibrillator pads overlie the chest. IMPRESSION: 1. Support apparatus appears appropriately positioned. 2. Bilateral interstitial and airspace disease compatible with diffuse pneumonia or asymmetric pulmonary edema. 3. Small right pleural effusion. Electronically Signed   By: Misty Stanley M.D.   On: 04/11/2017 17:44   Dg Chest Port 1 View  Result Date: 04/10/2017 CLINICAL DATA:  Respiratory failure. EXAM: PORTABLE CHEST 1 VIEW COMPARISON:  04/09/2017 . FINDINGS: Cardiomegaly with pulmonary vascular congestion and mild interstitial edema again noted. Bibasilar atelectasis. Bibasilar pneumonia cannot be excluded. Small bilateral pleural effusions again noted. No pneumothorax . IMPRESSION: 1. Cardiomegaly with mild pulmonary venous congestion interstitial edema. Small bilateral pleural effusions. No change. 2. Low lung volumes with bibasilar atelectasis. Bibasilar pneumonia cannot be excluded. Chest is unchanged from prior exam. Electronically Signed   By: Marcello Moores  Register   On: 04/10/2017 06:58   Dg Chest Port 1 View  Result Date: 04/09/2017 CLINICAL DATA:  Sepsis. EXAM: PORTABLE CHEST 1 VIEW COMPARISON:  04/08/2017. FINDINGS: Cardiomegaly with persistent pulmonary venous congestion with mild basilar interstitial edema and small bilateral pleural  effusions. Basilar atelectasis. IMPRESSION: 1. Persistent cardiomegaly with mild pulmonary venous congestion and basilar interstitial edema. Small bilateral pleural effusions. 2. Persistent bibasilar atelectasis. Bibasilar pneumonia cannot be excluded . Electronically Signed   By: Marcello Moores  Register   On: 04/09/2017 06:42   Dg Chest Port 1 View  Result Date: 04/08/2017 CLINICAL DATA:  66 y/o  F; bilateral lower extremity edema. EXAM: PORTABLE CHEST 1 VIEW COMPARISON:  09/18/2014 chest radiograph FINDINGS: Moderate cardiomegaly. Pulmonary venous hypertension. Moderate right pleural effusion and right basilar opacity. No acute osseous abnormality is evident. IMPRESSION: Cardiomegaly. Pulmonary venous hypertension. Moderate right pleural effusion. Right basilar opacity may represent atelectasis or pneumonia. Electronically Signed   By: Kristine Garbe M.D.   On: 04/08/2017 02:20   Dg Abd Portable 1v  Result Date: 04/19/2017 CLINICAL DATA:  Feeding tube placement EXAM: PORTABLE ABDOMEN - 1 VIEW COMPARISON:  04/11/2017 FINDINGS: Feeding tube is in place with the tip in the mid stomach. Right groin dialysis catheter in place with the tip overlying the L3 vertebral body. Nonobstructive bowel gas pattern. IMPRESSION: Feeding tube tip in the mid stomach Electronically Signed   By: Rolm Baptise M.D.   On: 04/19/2017 16:02   Dg Abd Portable 1v  Result Date: 04/11/2017 CLINICAL DATA:  Intubation. EXAM: PORTABLE ABDOMEN - 1 VIEW COMPARISON:  CT from 04/04/2017 FINDINGS: Nasogastric tube with tip in the low stomach. The stomach is moderately gas distended. Rectal tube is also present. Right femoral catheter with tip at the L2-3 disc level. Amorphous density over the right iliac fossa is likely enteric. No calcifications seen in this area on recent CT. Questionable fold thickening of pelvic bowel loops. Bowel gas pattern is no overall nonobstructive. IMPRESSION: 1. Nasogastric tube tip is at the distal stomach. 2.  Moderate gaseous distention of the stomach. 3. Right femoral venous catheter with tip at the L2-3 disc level. Electronically Signed   By: Monte Fantasia M.D.   On: 04/11/2017 18:07     Assessment and Recommendation  66 y.o. female with the paroxysmal nonvalvular atrial fibrillation admitted for respiratory failure and septic shock now having slow recovery after multiorgan failure and need for dialysis with acute renal failure slowly improving with physical activity and reasonable heart rate control of atrial fibrillation 1. Continue diltiazem and amiodarone for heart rate control of atrial fibrillation currently stable 2. Proceed to rehabilitation without restriction 3. Midrin for hypotension and concerns of symptoms during dialysis 4. Consideration of anticoagulation with warfarin with a goal INR between 2-3 or have discussed the possibility of use of low-dose elequis in the setting of renal failure which has been shown to be relatively safe in dialysis patients. Patient understands all risks and benefits of anticoagulation and the significant need for risk reduction with the more persistent atrial fibrillation at this time 5. Okay for discharge to rehabilitation with follow-up in 2 weeks for further adjustments of medication  Signed, Serafina Royals M.D. FACC

## 2017-05-04 NOTE — Care Management (Signed)
Patient pending insurance auth for CIR

## 2017-05-04 NOTE — Progress Notes (Signed)
Patient ID: [name], female   DOB: Jul 12, 1951, 66 y.o.   MRN: [id number]  [facility] PROGRESS NOTE  Paislie Tessler DXA:[id number] DOB: 27-Jul-1951 DOA: 04/08/2017 PCP: Tracie Harrier, MD  HPI/Subjective:   Eating better. No SOB Anuric Waiting for Inpatient rehab bed  Objective: Vitals:   05/04/17 1330 05/04/17 1354  BP: 108/81 102/78  Pulse: (!) 107 96  Resp: 20   Temp: 97.7 F (36.5 C) 97.7 F (36.5 C)    Filed Weights   05/02/17 0919 05/03/17 0500 05/04/17 0900  Weight: 75.5 kg (166 lb 7.2 oz) 75.8 kg (167 lb) 74.7 kg (164 lb 9.6 oz)    ROS: Review of Systems  Constitutional: Negative for chills, fever and malaise/fatigue.  HENT: Negative for hearing loss.   Eyes: Negative for blurred vision, double vision and photophobia.  Respiratory: Negative for cough, hemoptysis and shortness of breath.   Cardiovascular: Negative for chest pain, palpitations, orthopnea and leg swelling.  Gastrointestinal: Negative for abdominal pain, diarrhea and vomiting.  Genitourinary: Negative for dysuria and urgency.  Musculoskeletal: Negative for myalgias and neck pain.  Skin: Negative for rash.  Neurological: Positive for weakness. Negative for dizziness, focal weakness, seizures and headaches.  Psychiatric/Behavioral: Negative for memory loss. The patient does not have insomnia.     Exam: Physical Exam  HENT:  Head: Atraumatic.  Nose: No mucosal edema.  Mouth/Throat: No oropharyngeal exudate or posterior oropharyngeal edema.  Eyes: Pupils are equal, round, and reactive to light. Lids are normal.  Neck: Carotid bruit is not present. No thyromegaly present.  Cardiovascular: Regular rhythm, S1 normal, S2 normal and normal heart sounds.   Respiratory: She has no decreased breath sounds. She has no wheezes. She has no rales.  GI: Soft. Bowel sounds are normal. There is no tenderness.  Musculoskeletal:       Right ankle: She exhibits swelling.       Left ankle: She exhibits  swelling.  Lymphadenopathy:    She has no cervical adenopathy.  Skin: Skin is warm. No cyanosis.  Psychiatric: Her behavior is normal.      Data Reviewed: Basic Metabolic Panel:  Recent Labs Lab 04/27/17 1730 04/28/17 0507 04/30/17 0501 05/01/17 0613 05/02/17 0900 05/04/17 1029  NA  --  138 137 137 140 140  K  --  3.5 4.6 3.8 4.6 3.8  CL  --  96* 98* 96* 98* 99*  CO2  --  30 26 27 28 28   GLUCOSE  --  79 66 95 74 154*  BUN  --  38* 74* 47* 72* 55*  CREATININE  --  1.41* 3.01* 2.09* 3.08* 2.86*  CALCIUM  --  7.8* 7.9* 8.4* 8.7* 8.6*  PHOS 4.5  --  7.8*  --  6.9* 4.6   Liver Function Tests: CBC:  Recent Labs Lab 04/28/17 0507 05/01/17 0613 05/02/17 0900 05/04/17 1029  WBC 8.4 8.8 10.7 7.8  HGB 8.4* 8.9* 8.9* 8.6*  HCT 24.8* 26.4* 26.8* 26.0*  MCV 97.8 97.6 97.8 98.2  PLT 120* 127* 140* 184   BNP (last 3 results)  Recent Labs  04/08/17 0139  BNP 2,012.0*     CBG:  Recent Labs Lab 05/03/17 0756 05/03/17 1137 05/03/17 1634 05/03/17 2143 05/04/17 0725  GLUCAP 74 103* 104* 101* 73    Recent Results (from the past 240 hour(s))  Culture, blood (Routine X 2) w Reflex to ID Panel     Status: None   Collection Time: 04/26/17  7:30 AM  Result Value Ref  Range Status   Specimen Description BLOOD PICC LINE  Final   Special Requests   Final    BOTTLES DRAWN AEROBIC AND ANAEROBIC Blood Culture adequate volume   Culture NO GROWTH 5 DAYS  Final   Report Status 05/01/2017 FINAL  Final  Culture, blood (Routine X 2) w Reflex to ID Panel     Status: None   Collection Time: 04/26/17  7:30 AM  Result Value Ref Range Status   Specimen Description BLOOD RIGHT HAND  Final   Special Requests   Final    BOTTLES DRAWN AEROBIC AND ANAEROBIC Blood Culture adequate volume   Culture NO GROWTH 5 DAYS  Final   Report Status 05/01/2017 FINAL  Final     Studies: No results found.  Scheduled Meds: . amiodarone  200 mg Oral BID  . apixaban  2.5 mg Oral BID  .  collagenase   Topical Daily  . diltiazem  120 mg Oral Daily  . [START ON 05/07/2017] epoetin (EPOGEN/PROCRIT) injection  10,000 Units Intravenous Q M,W,F-HD  . famotidine  20 mg Oral QHS  . feeding supplement  1 Container Oral 5 X Daily  . levothyroxine  100 mcg Oral QAC breakfast  . midodrine  5 mg Oral TID WC  . multivitamin  1 tablet Oral QHS  . QUEtiapine  25 mg Oral QHS  . sertraline  25 mg Oral QHS   Continuous Infusions: . albumin human      Assessment/Plan:  # Atrial fibrillation with rapid ventricular rate Amiodarone . Started cardizem. Add eliquis per cardiology  # Acute hypoxic respiratory failure on full ventilatory support. Now extubated. On room air.   # PEA arrest secondary to septic shock. Recovered well after resuscitation through ACLS protocol.  # Septic shock secondary to lower extremity cellulitis. Off pressors. Cellulitis resolved. Antibiotics stopped.  # Shock liver secondary to septic shock, hypotension: Improving. Patient is tolerating diet.   #Acute renal failure with proteinuria, hematuria: Baseline creatinine 0.62. Patient anuric. Continue hemodialysis. Tunneled catheter in place.  # Hypertension. Started on Midodrin in the ICU. Continue.  # hypothyroidism: Continue Synthroid.  #Depression: Continue Seroquel.  # Diabetes mellitus type 2, Uncontrolled with hypoglycemia Was on D10. Now stopped. Continue Accu-Cheks.  CIR when insurance approval available  Code Status:     Code Status Orders        Start     Ordered   04/08/17 0651  Full code  Continuous     04/08/17 0650    Code Status History    Date Active Date Inactive Code Status Order ID Comments User Context   This patient has a current code status but no historical code status.      Consultants:  Critical care specialist  Infectious disease  Nephrology  Palliative care  Cardiology  Time spent: 30 minutes.   Antwyne Pingree, [employer]

## 2017-05-04 NOTE — Care Management (Signed)
Patient's outpatient HD schedule at [place] 6:40

## 2017-05-04 NOTE — Progress Notes (Signed)
[facility]  ROUNDING NOTE   Subjective:   Seen and examined on hemodialysis. Tolerating treatment well.     HEMODIALYSIS FLOWSHEET:  Blood Flow Rate (mL/min): 350 mL/min Arterial Pressure (mmHg): -90 mmHg Venous Pressure (mmHg): 130 mmHg Transmembrane Pressure (mmHg): 70 mmHg Ultrafiltration Rate (mL/min): 830 mL/min Dialysate Flow Rate (mL/min): 600 ml/min Conductivity: Machine : 14 Conductivity: Machine : 14 Dialysis Fluid Bolus: Normal Saline Bolus Amount (mL): 250 mL Dialysate Change:  (3k)    Objective:  Vital signs in last 24 hours:  Temp:  [97.7 F (36.5 C)-98.4 F (36.9 C)] 97.7 F (36.5 C) (07/27 1025) Pulse Rate:  [52-118] 86 (07/27 1230) Resp:  [13-20] 15 (07/27 1230) BP: (92-116)/(65-81) 94/74 (07/27 1230) SpO2:  [97 %-98 %] 98 % (07/27 1025) Weight:  [74.7 kg (164 lb 9.6 oz)] 74.7 kg (164 lb 9.6 oz) (07/27 0900)  Weight change:  Filed Weights   05/02/17 0919 05/03/17 0500 05/04/17 0900  Weight: 75.5 kg (166 lb 7.2 oz) 75.8 kg (167 lb) 74.7 kg (164 lb 9.6 oz)    Intake/Output: I/O last 3 completed shifts: In: 240 [P.O.:240] Out: 0    Intake/Output this shift:  No intake/output data recorded.  Physical Exam: General: Sitting in bed  Head: Normocephalic, atraumatic  Eyes: PERRL  Neck: supple  Lungs:  clear  Heart: Irregular  Abdomen:  Soft, nontender, obese  Extremities: trace bilateral lower extremity edema   Neurologic: Awake, alert, following commands   Skin: Warm, dry  Access: Right IJ permcath 7/19 Dr. Lucky Cowboy    Basic Metabolic Panel:  Recent Labs Lab 04/27/17 1730  04/28/17 0507 04/30/17 0501 05/01/17 0613 05/02/17 0900 05/04/17 1029  NA  --   --  138 137 137 140 140  K  --   --  3.5 4.6 3.8 4.6 3.8  CL  --   --  96* 98* 96* 98* 99*  CO2  --   --  30 26 27 28 28   GLUCOSE  --   --  79 66 95 74 154*  BUN  --   --  38* 74* 47* 72* 55*  CREATININE  --   --  1.41* 3.01* 2.09* 3.08* 2.86*  CALCIUM  --   < > 7.8*  7.9* 8.4* 8.7* 8.6*  PHOS 4.5  --   --  7.8*  --  6.9* 4.6  < > = values in this interval not displayed.  Liver Function Tests:  Recent Labs Lab 04/28/17 0507 05/02/17 0900 05/04/17 1029  AST 51*  --   --   ALT 59*  --   --   ALKPHOS 235*  --   --   BILITOT 2.4*  --   --   PROT 5.8*  --   --   ALBUMIN 3.4* 3.6 3.4*   No results for input(s): LIPASE, AMYLASE in the last 168 hours. No results for input(s): AMMONIA in the last 168 hours.  CBC:  Recent Labs Lab 04/28/17 0507 05/01/17 0613 05/02/17 0900 05/04/17 1029  WBC 8.4 8.8 10.7 7.8  HGB 8.4* 8.9* 8.9* 8.6*  HCT 24.8* 26.4* 26.8* 26.0*  MCV 97.8 97.6 97.8 98.2  PLT 120* 127* 140* 184    Cardiac Enzymes: No results for input(s): CKTOTAL, CKMB, CKMBINDEX, TROPONINI in the last 168 hours.  BNP: Invalid input(s): POCBNP  CBG:  Recent Labs Lab 05/03/17 0756 05/03/17 1137 05/03/17 1634 05/03/17 2143 05/04/17 0725  GLUCAP 74 103* 104* 101* 73    Microbiology: Results for  orders placed or performed during the hospital encounter of 04/08/17  Culture, blood (routine x 2)     Status: None   Collection Time: 04/08/17  2:04 AM  Result Value Ref Range Status   Specimen Description BLOOD RIGHT ANTECUBITAL  Final   Special Requests   Final    BOTTLES DRAWN AEROBIC AND ANAEROBIC Blood Culture adequate volume   Culture NO GROWTH 5 DAYS  Final   Report Status 04/13/2017 FINAL  Final  Culture, blood (routine x 2)     Status: None   Collection Time: 04/08/17  2:06 AM  Result Value Ref Range Status   Specimen Description BLOOD LEFT ANTECUBITAL  Final   Special Requests   Final    BOTTLES DRAWN AEROBIC AND ANAEROBIC Blood Culture adequate volume   Culture NO GROWTH 5 DAYS  Final   Report Status 04/13/2017 FINAL  Final  C difficile quick scan w PCR reflex     Status: None   Collection Time: 04/08/17  5:09 AM  Result Value Ref Range Status   C Diff antigen NEGATIVE NEGATIVE Final   C Diff toxin NEGATIVE NEGATIVE  Final   C Diff interpretation No C. difficile detected.  Final  MRSA PCR Screening     Status: None   Collection Time: 04/08/17  6:50 AM  Result Value Ref Range Status   MRSA by PCR NEGATIVE NEGATIVE Final    Comment:        The GeneXpert MRSA Assay (FDA approved for NASAL specimens only), is one component of a comprehensive MRSA colonization surveillance program. It is not intended to diagnose MRSA infection nor to guide or monitor treatment for MRSA infections.   Urine culture     Status: None   Collection Time: 04/08/17 12:16 PM  Result Value Ref Range Status   Specimen Description URINE, RANDOM  Final   Special Requests NONE  Final   Culture   Final    NO GROWTH Performed at [facility], [address] [place], [place] [address]    Report Status 04/10/2017 FINAL  Final  Aerobic Culture (superficial specimen)     Status: None   Collection Time: 04/09/17 11:32 AM  Result Value Ref Range Status   Specimen Description SKIN  Final   Special Requests NONE  Final   Gram Stain   Final    RARE WBC PRESENT, PREDOMINANTLY PMN NO SQUAMOUS EPITHELIAL CELLS SEEN NO ORGANISMS SEEN    Culture   Final    NO GROWTH 2 DAYS Performed at [facility], [place] [address], [place], [place] [address]    Report Status 04/11/2017 FINAL  Final  Gastrointestinal Panel by PCR , Stool     Status: None   Collection Time: 04/09/17  5:10 PM  Result Value Ref Range Status   Campylobacter species NOT DETECTED NOT DETECTED Final   Plesimonas shigelloides NOT DETECTED NOT DETECTED Final   Salmonella species NOT DETECTED NOT DETECTED Final   Yersinia enterocolitica NOT DETECTED NOT DETECTED Final   Vibrio species NOT DETECTED NOT DETECTED Final   Vibrio cholerae NOT DETECTED NOT DETECTED Final   Enteroaggregative E coli (EAEC) NOT DETECTED NOT DETECTED Final   Enteropathogenic E coli (EPEC) NOT DETECTED NOT DETECTED Final   Enterotoxigenic E coli (ETEC) NOT DETECTED NOT DETECTED Final    Shiga like toxin producing E coli (STEC) NOT DETECTED NOT DETECTED Final   Shigella/Enteroinvasive E coli (EIEC) NOT DETECTED NOT DETECTED Final   Cryptosporidium NOT  DETECTED NOT DETECTED Final   Cyclospora cayetanensis NOT DETECTED NOT DETECTED Final   Entamoeba histolytica NOT DETECTED NOT DETECTED Final   Giardia lamblia NOT DETECTED NOT DETECTED Final   Adenovirus F40/41 NOT DETECTED NOT DETECTED Final   Astrovirus NOT DETECTED NOT DETECTED Final   Norovirus GI/GII NOT DETECTED NOT DETECTED Final   Rotavirus A NOT DETECTED NOT DETECTED Final   Sapovirus (I, II, IV, and V) NOT DETECTED NOT DETECTED Final  Culture, respiratory (NON-Expectorated)     Status: None   Collection Time: 04/12/17 12:00 PM  Result Value Ref Range Status   Specimen Description TRACHEAL ASPIRATE  Final   Special Requests NONE  Final   Gram Stain   Final    MODERATE WBC PRESENT,BOTH PMN AND MONONUCLEAR MODERATE YEAST Performed at [facility], [address] [place], [place] [address]    Culture MODERATE CANDIDA TROPICALIS  Final   Report Status 04/14/2017 FINAL  Final  Culture, respiratory (NON-Expectorated)     Status: None   Collection Time: 04/15/17  7:48 AM  Result Value Ref Range Status   Specimen Description TRACHEAL ASPIRATE  Final   Special Requests Normal  Final   Gram Stain   Final    RARE WBC PRESENT, PREDOMINANTLY PMN FEW BUDDING YEAST SEEN Performed at [facility], [place] [address] [place], [place] [address]    Culture ABUNDANT CANDIDA TROPICALIS  Final   Report Status 04/18/2017 FINAL  Final  CULTURE, BLOOD (ROUTINE X 2) w Reflex to ID Panel     Status: None   Collection Time: 04/15/17 10:46 AM  Result Value Ref Range Status   Specimen Description BLOOD LEFT SUBCLAVIAN TRIPLE LUMEN  Final   Special Requests   Final    BOTTLES DRAWN AEROBIC AND ANAEROBIC Blood Culture results may not be optimal due to an excessive volume of blood received in culture bottles   Culture NO  GROWTH 5 DAYS  Final   Report Status 04/20/2017 FINAL  Final  CULTURE, BLOOD (ROUTINE X 2) w Reflex to ID Panel     Status: None   Collection Time: 04/16/17  1:21 PM  Result Value Ref Range Status   Specimen Description BLOOD BLOOD LEFT HAND  Final   Special Requests   Final    BOTTLES DRAWN AEROBIC AND ANAEROBIC Blood Culture results may not be optimal due to an inadequate volume of blood received in culture bottles   Culture NO GROWTH 5 DAYS  Final   Report Status 04/21/2017 FINAL  Final  CULTURE, BLOOD (ROUTINE X 2) w Reflex to ID Panel     Status: None   Collection Time: 04/16/17  1:27 PM  Result Value Ref Range Status   Specimen Description BLOOD A-LINE DRAW  Final   Special Requests   Final    BOTTLES DRAWN AEROBIC AND ANAEROBIC Blood Culture adequate volume   Culture NO GROWTH 5 DAYS  Final   Report Status 04/21/2017 FINAL  Final  C difficile quick scan w PCR reflex     Status: None   Collection Time: 04/16/17 10:15 PM  Result Value Ref Range Status   C Diff antigen NEGATIVE NEGATIVE Final   C Diff toxin NEGATIVE NEGATIVE Final   C Diff interpretation No C. difficile detected.  Final    Comment: VALID  Culture, blood (Routine X 2) w Reflex to ID Panel     Status: None   Collection Time: 04/26/17  7:30 AM  Result Value Ref  Range Status   Specimen Description BLOOD PICC LINE  Final   Special Requests   Final    BOTTLES DRAWN AEROBIC AND ANAEROBIC Blood Culture adequate volume   Culture NO GROWTH 5 DAYS  Final   Report Status 05/01/2017 FINAL  Final  Culture, blood (Routine X 2) w Reflex to ID Panel     Status: None   Collection Time: 04/26/17  7:30 AM  Result Value Ref Range Status   Specimen Description BLOOD RIGHT HAND  Final   Special Requests   Final    BOTTLES DRAWN AEROBIC AND ANAEROBIC Blood Culture adequate volume   Culture NO GROWTH 5 DAYS  Final   Report Status 05/01/2017 FINAL  Final    Coagulation Studies: No results for input(s): LABPROT, INR in the last  72 hours.  Urinalysis: No results for input(s): COLORURINE, LABSPEC, PHURINE, GLUCOSEU, HGBUR, BILIRUBINUR, KETONESUR, PROTEINUR, UROBILINOGEN, NITRITE, LEUKOCYTESUR in the last 72 hours.  Invalid input(s): APPERANCEUR    Imaging: No results found.   Medications:   . albumin human     . amiodarone  200 mg Oral BID  . apixaban  2.5 mg Oral BID  . collagenase   Topical Daily  . diltiazem  120 mg Oral Daily  . famotidine  20 mg Oral QHS  . feeding supplement  1 Container Oral 5 X Daily  . levothyroxine  100 mcg Oral QAC breakfast  . midodrine  5 mg Oral TID WC  . multivitamin  1 tablet Oral QHS  . QUEtiapine  25 mg Oral QHS  . sertraline  25 mg Oral QHS   acetaminophen, artificial tears, heparin, ipratropium-albuterol, [DISCONTINUED] ondansetron **OR** ondansetron (ZOFRAN) IV, phenol, sodium chloride flush, zinc oxide  Assessment/ Plan:  [name] is a 66 y.o. white female  with hypertension, hypothyroidism, atrial fibrillation, who was admitted to [facility] on 04/08/2017 for evaluation of increasing lower extremity edema.  Hospital course: Admitted on 04/08/2017 with significant peripheral edema and cellulitis. Started on CRRT for hypotension and anuric renal failure on 7/2. Patient weaned off CRRT on 7/4. However later that day, patient had cardiac arrest and coded. Intubated, sedated and restarted on three vasopressors. Restart on CRRT on 7/4.  2-D echo which shows EF 25-30% Now transitioned to intermittent hemodialysis MWF  1. End Stage Renal Disease: limited hope for renal recovery.  - Serologic work up: compliments normal, negative SPEP/UPEP, ANA negative, ANCA negative, GBM negative, negative hepatitis - Patient remains anuric. Hemodialysis MWF schedule. RIJ permcath.  Outpatient planning for [facility]  2. 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 Echocardiogram  with EF of 25-30% - Continue midodrine to help sustain blood pressure.  - IV albumin with HD treatment as needed  3. 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  - Continue glucose control  4. Anemia of chronic kidney disease: hemoglobin 8.6 - epo on hemodialysis treatment.    LOS: [place] 7/27/201812:39 PM

## 2017-05-05 LAB — GLUCOSE, CAPILLARY
Glucose-Capillary: 67 mg/dL (ref 65–99)
Glucose-Capillary: 86 mg/dL (ref 65–99)
Glucose-Capillary: 83 mg/dL (ref 65–99)
Glucose-Capillary: 84 mg/dL (ref 65–99)

## 2017-05-05 NOTE — Progress Notes (Signed)
Patient ID: [name], female   DOB: 10/20/1950, 66 y.o.   MRN: [id number]  [facility] PROGRESS NOTE  [name] JME:[id number] DOB: [birth year] DOA: 04/08/2017 PCP: Tracie Harrier, MD  HPI/Subjective:   Slowly improving Laying in bed. Afebrile No pain  Objective: Vitals:   05/04/17 2025 05/05/17 0444  BP: 96/60 116/83  Pulse: 94 (!) 115  Resp: 20 20  Temp: (!) 97.5 F (36.4 C) 98.8 F (37.1 C)    Filed Weights   05/03/17 0500 05/04/17 0900 05/05/17 0444  Weight: 75.8 kg (167 lb) 74.7 kg (164 lb 9.6 oz) 73.2 kg (161 lb 4.8 oz)    ROS: Review of Systems  Constitutional: Negative for chills, fever and malaise/fatigue.  HENT: Negative for hearing loss.   Eyes: Negative for blurred vision, double vision and photophobia.  Respiratory: Negative for cough, hemoptysis and shortness of breath.   Cardiovascular: Negative for chest pain, palpitations, orthopnea and leg swelling.  Gastrointestinal: Negative for abdominal pain, diarrhea and vomiting.  Genitourinary: Negative for dysuria and urgency.  Musculoskeletal: Negative for myalgias and neck pain.  Skin: Negative for rash.  Neurological: Positive for weakness. Negative for dizziness, focal weakness, seizures and headaches.  Psychiatric/Behavioral: Negative for memory loss. The patient does not have insomnia.     Exam: Physical Exam  HENT:  Head: Atraumatic.  Nose: No mucosal edema.  Mouth/Throat: No oropharyngeal exudate or posterior oropharyngeal edema.  Eyes: Pupils are equal, round, and reactive to light. Lids are normal.  Neck: Carotid bruit is not present. No thyromegaly present.  Cardiovascular: Regular rhythm, S1 normal, S2 normal and normal heart sounds.   Respiratory: She has no decreased breath sounds. She has no wheezes. She has no rales.  GI: Soft. Bowel sounds are normal. There is no tenderness.  Musculoskeletal:       Right ankle: She exhibits swelling.       Left ankle: She exhibits swelling.   Lymphadenopathy:    She has no cervical adenopathy.  Skin: Skin is warm. No cyanosis.  Psychiatric: Her behavior is normal.      Data Reviewed: Basic Metabolic Panel:  Recent Labs Lab 04/30/17 0501 05/01/17 0613 05/02/17 0900 05/04/17 1029  NA 137 137 140 140  K 4.6 3.8 4.6 3.8  CL 98* 96* 98* 99*  CO2 26 27 28 28   GLUCOSE 66 95 74 154*  BUN 74* 47* 72* 55*  CREATININE 3.01* 2.09* 3.08* 2.86*  CALCIUM 7.9* 8.4* 8.7* 8.6*  PHOS 7.8*  --  6.9* 4.6   Liver Function Tests: CBC:  Recent Labs Lab 05/01/17 0613 05/02/17 0900 05/04/17 1029  WBC 8.8 10.7 7.8  HGB 8.9* 8.9* 8.6*  HCT 26.4* 26.8* 26.0*  MCV 97.6 97.8 98.2  PLT 127* 140* 184   BNP (last 3 results)  Recent Labs  04/08/17 0139  BNP 2,012.0*     CBG:  Recent Labs Lab 05/04/17 0725 05/04/17 1646 05/04/17 2148 05/05/17 0734 05/05/17 1203  GLUCAP 73 101* 104* 67 86    Recent Results (from the past 240 hour(s))  Culture, blood (Routine X 2) w Reflex to ID Panel     Status: None   Collection Time: 04/26/17  7:30 AM  Result Value Ref Range Status   Specimen Description BLOOD PICC LINE  Final   Special Requests   Final    BOTTLES DRAWN AEROBIC AND ANAEROBIC Blood Culture adequate volume   Culture NO GROWTH 5 DAYS  Final   Report Status 05/01/2017 FINAL  Final  Culture, blood (Routine X 2) w Reflex to ID Panel     Status: None   Collection Time: 04/26/17  7:30 AM  Result Value Ref Range Status   Specimen Description BLOOD RIGHT HAND  Final   Special Requests   Final    BOTTLES DRAWN AEROBIC AND ANAEROBIC Blood Culture adequate volume   Culture NO GROWTH 5 DAYS  Final   Report Status 05/01/2017 FINAL  Final     Studies: No results found.  Scheduled Meds: . amiodarone  200 mg Oral BID  . apixaban  2.5 mg Oral BID  . collagenase   Topical Daily  . diltiazem  120 mg Oral Daily  . [START ON 05/07/2017] epoetin (EPOGEN/PROCRIT) injection  10,000 Units Intravenous Q M,W,F-HD  . famotidine   20 mg Oral QHS  . feeding supplement  1 Container Oral 5 X Daily  . levothyroxine  100 mcg Oral QAC breakfast  . midodrine  5 mg Oral TID WC  . multivitamin  1 tablet Oral QHS  . QUEtiapine  25 mg Oral QHS  . sertraline  25 mg Oral QHS   Continuous Infusions: . albumin human      Assessment/Plan:  # Atrial fibrillation with rapid ventricular rate - resolved Amiodarone . On cardizem. Added eliquis per cardiology  # Acute hypoxic respiratory failure on full ventilatory support. Now extubated. On room air.   # PEA arrest secondary to septic shock. Recovered well after resuscitation through ACLS protocol.  # Septic shock secondary to lower extremity cellulitis. Off pressors. Cellulitis resolved. Antibiotics stopped.  # Shock liver secondary to septic shock, hypotension: Improving. Patient is tolerating diet.   #Acute renal failure with proteinuria, hematuria: Baseline creatinine 0.62. Patient anuric. Continue hemodialysis. Tunneled catheter in place.  # Hyperoension. on Midodrin  # hypothyroidism: Continue Synthroid.  #Depression: Continue Seroquel.  # Diabetes mellitus type 2, Uncontrolled with hypoglycemia Was on D10. Now stopped. Continue Accu-Cheks.  CIR when insurance approval available. Medically stable  Code Status:     Code Status Orders        Start     Ordered   04/08/17 0651  Full code  Continuous     04/08/17 0650    Code Status History    Date Active Date Inactive Code Status Order ID Comments User Context   This patient has a current code status but no historical code status.      Consultants:  Critical care specialist  Infectious disease  Nephrology  Palliative care  Cardiology  Time spent: 30 minutes.   Sherlyne Crownover, [employer]

## 2017-05-05 NOTE — Progress Notes (Signed)
Occupational Therapy Treatment Patient Details Name: [name] MRN: [id number] DOB: 04/07/1951 Today's Date: 05/05/2017    History of present illness 66yo female pt presented to ER on 7/1 secondary to LE swelling, difficulty walking; admitted with sepsis due to LE cellulitis.  Patient hospital course complicated by PEA arrest requiring ACLS with subsequent intubation (7/4-7/9), CRRT 7/2-7/18 with transition to HD (R IJ perm cath).   OT comments  Pt seen for OT treatment this date. Pt in good spirits, happy to see OT and agreeable to OT session. Pt expressed satisfaction with her progress thus far and looking forward to continuing to progress at CIR, with a plan to transfer on Monday. Pt demonstrated slight improvement with ability to assist with bed mobility, still requiring moderate to max assist supine> sit EOB and max to total assist for sitting EOB > supine. Pt demonstrated improved activity tolerance and better upright posture while seated EOB for 25 minutes for grooming tasks requiring supervision after set up. Reviewed ROM/strengthening exercises pt can do in bed throughout the day to improve strength and activity tolerance and pt verbalized understanding. Strength improving slightly (R shoulder flexion 2+/5, L shoulder flexion 3-/5, bilateral elbow flexion 3+/5, bilat elbow extension 3/5, bilat grip strength fair +). Pt happy to report that she can now tolerate holding and texting on her phone which she was unable to do a couple days ago due to weakness and poor activity tolerance. Also notes she has been able to eat a little more and attempting to increased her protein intake to support her recovery/strength. Recommendations remain appropriate. Will continue to progress pt in OT goals.    Follow Up Recommendations  [facility]    Equipment Recommendations  3 in 1 bedside commode    Recommendations for Other Services      Precautions / Restrictions Precautions Precautions: Fall Precaution  Comments: R IJ perm cath Restrictions Weight Bearing Restrictions: No       Mobility Bed Mobility Overal bed mobility: Needs Assistance Bed Mobility: Supine to Sit;Sit to Supine     Supine to sit: Mod assist;Max assist Sit to supine: Max assist;Total assist   General bed mobility comments: mod-max assist for BLE and trunk mgt for sup>sit EOB with VC for hand placement and use of bed rails to assist with sitting upright. Max assist to total assist x1 with bed in trendelenburg position for sit>sup due to fatigue and weakness.  Transfers                 General transfer comment: deferred due to fatigue/safety    Balance Overall balance assessment: Needs assistance Sitting-balance support: Single extremity supported;Feet supported Sitting balance-Leahy Scale: Good Sitting balance - Comments: able to maintain neutral sitting posture, still demonstrating sacral sitting, slightly flexed posture but able to self correct with verbal cues, sat up for grooming tasks with no UE support on bed for approx 10 minutes, then using single or both UEs on bed to support upright positioning due to fatigue.                                    ADL either performed or assessed with clinical judgement   ADL Overall ADL's : Needs assistance/impaired     Grooming: Sitting;Wash/dry hands;Wash/dry face;Oral care Grooming Details (indicate cue type and reason): Pt tolerated sitting EOB for 25 minutes today with supervision and set up to wash hands/face and brush teeth. Pt  demonstrating continued improvement in overall strength and activity tolerance for self care tasks.                                General ADL Comments: pt continues to require max assist for all bathing, dressing, and toileting tasks due to profound weakness; reeducated pt in BUE ROM/strengthening exercises pt can perform in bed throughout the day to support strengthening and activity tolerance      Vision Baseline Vision/History: Wears glasses Wears Glasses: At all times Patient Visual Report: No change from baseline Vision Assessment?: No apparent visual deficits   Perception     Praxis      Cognition Arousal/Alertness: Awake/alert Behavior During Therapy: WFL for tasks assessed/performed Overall Cognitive Status: Within Functional Limits for tasks assessed                                 General Comments: Pt in pleasant mood, happy to see therapist, pleased with her progress thus far and looking forward to going to CIR on Monday        Exercises     Shoulder Instructions       General Comments mild BLE swelling persists    Pertinent Vitals/ Pain       Pain Assessment: No/denies pain  Home Living                                          Prior Functioning/Environment              Frequency  Min 3X/week        Progress Toward Goals  OT Goals(current goals can now be found in the care plan section)  Progress towards OT goals: Progressing toward goals  Acute Rehab OT Goals Patient Stated Goal: to get stronger OT Goal Formulation: With patient/family Time For Goal Achievement: 05/12/17 Potential to Achieve Goals: Good  Plan Discharge plan remains appropriate;Frequency remains appropriate    Co-evaluation                 AM-PAC PT "6 Clicks" Daily Activity     Outcome Measure   Help from another person eating meals?: None Help from another person taking care of personal grooming?: A Little Help from another person toileting, which includes using toliet, bedpan, or urinal?: A Lot Help from another person bathing (including washing, rinsing, drying)?: A Lot Help from another person to put on and taking off regular upper body clothing?: A Lot Help from another person to put on and taking off regular lower body clothing?: A Lot 6 Click Score: 15    End of Session    OT Visit Diagnosis: Other  abnormalities of gait and mobility (R26.89);Muscle weakness (generalized) (M62.81)   Activity Tolerance Patient tolerated treatment well   Patient Left in bed;with call bell/phone within reach;with bed alarm set   Nurse Communication          Time: 7672-0947 OT Time Calculation (min): 35 min  Charges: OT General Charges $OT Visit: 1 Procedure OT Treatments $Self Care/Home Management : 23-37 mins  Jeni Salles, MPH, MS, OTR/L ascom [phone] 05/05/17, 3:17 PM

## 2017-05-05 NOTE — Progress Notes (Signed)
[facility]  ROUNDING NOTE   Subjective:   Hemodialysis treatment yesterday. Tolerated treatment well. Blood pressure maintained and did not require albumin during treatment.  UF of 2 liters  Objective:  Vital signs in last 24 hours:  Temp:  [97.5 F (36.4 C)-98.8 F (37.1 C)] 98.8 F (37.1 C) (07/28 0444) Pulse Rate:  [52-118] 115 (07/28 0444) Resp:  [13-20] 20 (07/28 0444) BP: (89-116)/(60-83) 116/83 (07/28 0444) SpO2:  [98 %-99 %] 98 % (07/28 0444) Weight:  [73.2 kg (161 lb 4.8 oz)] 73.2 kg (161 lb 4.8 oz) (07/28 0444)  Weight change:  Filed Weights   05/03/17 0500 05/04/17 0900 05/05/17 0444  Weight: 75.8 kg (167 lb) 74.7 kg (164 lb 9.6 oz) 73.2 kg (161 lb 4.8 oz)    Intake/Output: I/O last 3 completed shifts: In: 240 [P.O.:240] Out: 2000 [Other:2000]   Intake/Output this shift:  Total I/O In: 320 [P.O.:320] Out: 0   Physical Exam: General: Sitting in bed  Head: Normocephalic, atraumatic  Eyes: PERRL  Neck: supple  Lungs:  clear  Heart: Irregular  Abdomen:  Soft, nontender, obese  Extremities: bilateral lower extremity edema   Neurologic: Awake, alert, following commands   Skin: Warm, dry  Access: Right IJ permcath 7/19 Dr. Lucky Cowboy    Basic Metabolic Panel:  Recent Labs Lab 04/30/17 0501 05/01/17 0613 05/02/17 0900 05/04/17 1029  NA 137 137 140 140  K 4.6 3.8 4.6 3.8  CL 98* 96* 98* 99*  CO2 26 27 28 28   GLUCOSE 66 95 74 154*  BUN 74* 47* 72* 55*  CREATININE 3.01* 2.09* 3.08* 2.86*  CALCIUM 7.9* 8.4* 8.7* 8.6*  PHOS 7.8*  --  6.9* 4.6    Liver Function Tests:  Recent Labs Lab 05/02/17 0900 05/04/17 1029  ALBUMIN 3.6 3.4*   No results for input(s): LIPASE, AMYLASE in the last 168 hours. No results for input(s): AMMONIA in the last 168 hours.  CBC:  Recent Labs Lab 05/01/17 0613 05/02/17 0900 05/04/17 1029  WBC 8.8 10.7 7.8  HGB 8.9* 8.9* 8.6*  HCT 26.4* 26.8* 26.0*  MCV 97.6 97.8 98.2  PLT 127* 140* 184     Cardiac Enzymes: No results for input(s): CKTOTAL, CKMB, CKMBINDEX, TROPONINI in the last 168 hours.  BNP: Invalid input(s): POCBNP  CBG:  Recent Labs Lab 05/03/17 2143 05/04/17 0725 05/04/17 1646 05/04/17 2148 05/05/17 0734  GLUCAP 101* 73 101* 104* 98    Microbiology: Results for orders placed or performed during the hospital encounter of 04/08/17  Culture, blood (routine x 2)     Status: None   Collection Time: 04/08/17  2:04 AM  Result Value Ref Range Status   Specimen Description BLOOD RIGHT ANTECUBITAL  Final   Special Requests   Final    BOTTLES DRAWN AEROBIC AND ANAEROBIC Blood Culture adequate volume   Culture NO GROWTH 5 DAYS  Final   Report Status 04/13/2017 FINAL  Final  Culture, blood (routine x 2)     Status: None   Collection Time: 04/08/17  2:06 AM  Result Value Ref Range Status   Specimen Description BLOOD LEFT ANTECUBITAL  Final   Special Requests   Final    BOTTLES DRAWN AEROBIC AND ANAEROBIC Blood Culture adequate volume   Culture NO GROWTH 5 DAYS  Final   Report Status 04/13/2017 FINAL  Final  C difficile quick scan w PCR reflex     Status: None   Collection Time: 04/08/17  5:09 AM  Result Value Ref  Range Status   C Diff antigen NEGATIVE NEGATIVE Final   C Diff toxin NEGATIVE NEGATIVE Final   C Diff interpretation No C. difficile detected.  Final  MRSA PCR Screening     Status: None   Collection Time: 04/08/17  6:50 AM  Result Value Ref Range Status   MRSA by PCR NEGATIVE NEGATIVE Final    Comment:        The GeneXpert MRSA Assay (FDA approved for NASAL specimens only), is one component of a comprehensive MRSA colonization surveillance program. It is not intended to diagnose MRSA infection nor to guide or monitor treatment for MRSA infections.   Urine culture     Status: None   Collection Time: 04/08/17 12:16 PM  Result Value Ref Range Status   Specimen Description URINE, RANDOM  Final   Special Requests NONE  Final   Culture    Final    NO GROWTH Performed at [facility], [address], [place], [place] [address]    Report Status 04/10/2017 FINAL  Final  Aerobic Culture (superficial specimen)     Status: None   Collection Time: 04/09/17 11:32 AM  Result Value Ref Range Status   Specimen Description SKIN  Final   Special Requests NONE  Final   Gram Stain   Final    RARE WBC PRESENT, PREDOMINANTLY PMN NO SQUAMOUS EPITHELIAL CELLS SEEN NO ORGANISMS SEEN    Culture   Final    NO GROWTH 2 DAYS Performed at [facility], [place] [address], [place], [place] [address]    Report Status 04/11/2017 FINAL  Final  Gastrointestinal Panel by PCR , Stool     Status: None   Collection Time: 04/09/17  5:10 PM  Result Value Ref Range Status   Campylobacter species NOT DETECTED NOT DETECTED Final   Plesimonas shigelloides NOT DETECTED NOT DETECTED Final   Salmonella species NOT DETECTED NOT DETECTED Final   Yersinia enterocolitica NOT DETECTED NOT DETECTED Final   Vibrio species NOT DETECTED NOT DETECTED Final   Vibrio cholerae NOT DETECTED NOT DETECTED Final   Enteroaggregative E coli (EAEC) NOT DETECTED NOT DETECTED Final   Enteropathogenic E coli (EPEC) NOT DETECTED NOT DETECTED Final   Enterotoxigenic E coli (ETEC) NOT DETECTED NOT DETECTED Final   Shiga like toxin producing E coli (STEC) NOT DETECTED NOT DETECTED Final   Shigella/Enteroinvasive E coli (EIEC) NOT DETECTED NOT DETECTED Final   Cryptosporidium NOT DETECTED NOT DETECTED Final   Cyclospora cayetanensis NOT DETECTED NOT DETECTED Final   Entamoeba histolytica NOT DETECTED NOT DETECTED Final   Giardia lamblia NOT DETECTED NOT DETECTED Final   Adenovirus F40/41 NOT DETECTED NOT DETECTED Final   Astrovirus NOT DETECTED NOT DETECTED Final   Norovirus GI/GII NOT DETECTED NOT DETECTED Final   Rotavirus A NOT DETECTED NOT DETECTED Final   Sapovirus (I, II, IV, and V) NOT DETECTED NOT DETECTED Final  Culture, respiratory (NON-Expectorated)      Status: None   Collection Time: 04/12/17 12:00 PM  Result Value Ref Range Status   Specimen Description TRACHEAL ASPIRATE  Final   Special Requests NONE  Final   Gram Stain   Final    MODERATE WBC PRESENT,BOTH PMN AND MONONUCLEAR MODERATE YEAST Performed at [facility], [address], [place], [place] [address]    Culture MODERATE CANDIDA TROPICALIS  Final   Report Status 04/14/2017 FINAL  Final  Culture, respiratory (NON-Expectorated)     Status: None   Collection  Time: 04/15/17  7:48 AM  Result Value Ref Range Status   Specimen Description TRACHEAL ASPIRATE  Final   Special Requests Normal  Final   Gram Stain   Final    RARE WBC PRESENT, PREDOMINANTLY PMN FEW BUDDING YEAST SEEN Performed at [facility] [address] [place], [place] [address]    Culture ABUNDANT CANDIDA TROPICALIS  Final   Report Status 04/18/2017 FINAL  Final  CULTURE, BLOOD (ROUTINE X 2) w Reflex to ID Panel     Status: None   Collection Time: 04/15/17 10:46 AM  Result Value Ref Range Status   Specimen Description BLOOD LEFT SUBCLAVIAN TRIPLE LUMEN  Final   Special Requests   Final    BOTTLES DRAWN AEROBIC AND ANAEROBIC Blood Culture results may not be optimal due to an excessive volume of blood received in culture bottles   Culture NO GROWTH 5 DAYS  Final   Report Status 04/20/2017 FINAL  Final  CULTURE, BLOOD (ROUTINE X 2) w Reflex to ID Panel     Status: None   Collection Time: 04/16/17  1:21 PM  Result Value Ref Range Status   Specimen Description BLOOD BLOOD LEFT HAND  Final   Special Requests   Final    BOTTLES DRAWN AEROBIC AND ANAEROBIC Blood Culture results may not be optimal due to an inadequate volume of blood received in culture bottles   Culture NO GROWTH 5 DAYS  Final   Report Status 04/21/2017 FINAL  Final  CULTURE, BLOOD (ROUTINE X 2) w Reflex to ID Panel     Status: None   Collection Time: 04/16/17  1:27 PM  Result Value Ref Range Status   Specimen Description BLOOD  A-LINE DRAW  Final   Special Requests   Final    BOTTLES DRAWN AEROBIC AND ANAEROBIC Blood Culture adequate volume   Culture NO GROWTH 5 DAYS  Final   Report Status 04/21/2017 FINAL  Final  C difficile quick scan w PCR reflex     Status: None   Collection Time: 04/16/17 10:15 PM  Result Value Ref Range Status   C Diff antigen NEGATIVE NEGATIVE Final   C Diff toxin NEGATIVE NEGATIVE Final   C Diff interpretation No C. difficile detected.  Final    Comment: VALID  Culture, blood (Routine X 2) w Reflex to ID Panel     Status: None   Collection Time: 04/26/17  7:30 AM  Result Value Ref Range Status   Specimen Description BLOOD PICC LINE  Final   Special Requests   Final    BOTTLES DRAWN AEROBIC AND ANAEROBIC Blood Culture adequate volume   Culture NO GROWTH 5 DAYS  Final   Report Status 05/01/2017 FINAL  Final  Culture, blood (Routine X 2) w Reflex to ID Panel     Status: None   Collection Time: 04/26/17  7:30 AM  Result Value Ref Range Status   Specimen Description BLOOD RIGHT HAND  Final   Special Requests   Final    BOTTLES DRAWN AEROBIC AND ANAEROBIC Blood Culture adequate volume   Culture NO GROWTH 5 DAYS  Final   Report Status 05/01/2017 FINAL  Final    Coagulation Studies: No results for input(s): LABPROT, INR in the last 72 hours.  Urinalysis: No results for input(s): COLORURINE, LABSPEC, PHURINE, GLUCOSEU, HGBUR, BILIRUBINUR, KETONESUR, PROTEINUR, UROBILINOGEN, NITRITE, LEUKOCYTESUR in the last 72 hours.  Invalid input(s): APPERANCEUR    Imaging: No results found.   Medications:   .  albumin human     . amiodarone  200 mg Oral BID  . apixaban  2.5 mg Oral BID  . collagenase   Topical Daily  . diltiazem  120 mg Oral Daily  . [START ON 05/07/2017] epoetin (EPOGEN/PROCRIT) injection  10,000 Units Intravenous Q M,W,F-HD  . famotidine  20 mg Oral QHS  . feeding supplement  1 Container Oral 5 X Daily  . levothyroxine  100 mcg Oral QAC breakfast  . midodrine  5 mg  Oral TID WC  . multivitamin  1 tablet Oral QHS  . QUEtiapine  25 mg Oral QHS  . sertraline  25 mg Oral QHS   acetaminophen, artificial tears, heparin, ipratropium-albuterol, [DISCONTINUED] ondansetron **OR** ondansetron (ZOFRAN) IV, phenol, sodium chloride flush, zinc oxide  Assessment/ Plan:  [name] is a 66 y.o. white female  with hypertension, hypothyroidism, atrial fibrillation, who was admitted to [facility] on 04/08/2017 for evaluation of increasing lower extremity edema.  Hospital course: Admitted on 04/08/2017 with significant peripheral edema and cellulitis. Started on CRRT for hypotension and anuric renal failure on 7/2. Patient weaned off CRRT on 7/4. However later that day, patient had cardiac arrest and coded. Intubated, sedated and restarted on three vasopressors. Restart on CRRT on 7/4.  2-D echo which shows EF 25-30% Now transitioned to intermittent hemodialysis MWF  1. End Stage Renal Disease: limited hope for renal recovery.  - Serologic work up: compliments normal, negative SPEP/UPEP, ANA negative, ANCA negative, GBM negative, negative hepatitis - Patient remains anuric. Hemodialysis MWF schedule. RIJ permcath. Next treatment for Monday Outpatient planning for [name] [place]  2. 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 Echocardiogram with EF of 25-30% - Continue midodrine to help sustain blood pressure.  - IV albumin with HD treatment as needed  3. 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  - Continue glucose control  4. Anemia of chronic kidney disease: hemoglobin 8.6 - epo on hemodialysis treatment.    LOS: [place], [place] 7/28/201810:54 AM

## 2017-05-06 LAB — GLUCOSE, CAPILLARY
Glucose-Capillary: 71 mg/dL (ref 65–99)
Glucose-Capillary: 94 mg/dL (ref 65–99)
Glucose-Capillary: 96 mg/dL (ref 65–99)
Glucose-Capillary: 80 mg/dL (ref 65–99)

## 2017-05-06 NOTE — Progress Notes (Signed)
[facility]  ROUNDING NOTE   Subjective:   Husband at bedside.  Patient without complaints.  Objective:  Vital signs in last 24 hours:  Temp:  [97.7 F (36.5 C)-98.3 F (36.8 C)] 97.7 F (36.5 C) (07/29 0559) Pulse Rate:  [103-109] 109 (07/29 0559) Resp:  [15-21] 21 (07/29 0559) BP: (107-115)/(68-76) 107/68 (07/29 0559) SpO2:  [97 %-100 %] 97 % (07/29 0559) Weight:  [76.3 kg (168 lb 4.8 oz)] 76.3 kg (168 lb 4.8 oz) (07/29 0500)  Weight change: 1.678 kg (3 lb 11.2 oz) Filed Weights   05/04/17 0900 05/05/17 0444 05/06/17 0500  Weight: 74.7 kg (164 lb 9.6 oz) 73.2 kg (161 lb 4.8 oz) 76.3 kg (168 lb 4.8 oz)    Intake/Output: I/O last 3 completed shifts: In: 560 [P.O.:560] Out: 0    Intake/Output this shift:  Total I/O In: 120 [P.O.:120] Out: -   Physical Exam: General: Sitting in bed  Head: Normocephalic, atraumatic  Eyes: PERRL  Neck: supple  Lungs:  clear  Heart: Irregular  Abdomen:  Soft, nontender, obese  Extremities: bilateral lower extremity edema   Neurologic: Awake, alert, following commands   Skin: Warm, dry  Access: Right IJ permcath 7/19 Dr. Lucky Cowboy    Basic Metabolic Panel:  Recent Labs Lab 04/30/17 0501 05/01/17 0613 05/02/17 0900 05/04/17 1029  NA 137 137 140 140  K 4.6 3.8 4.6 3.8  CL 98* 96* 98* 99*  CO2 26 27 28 28   GLUCOSE 66 95 74 154*  BUN 74* 47* 72* 55*  CREATININE 3.01* 2.09* 3.08* 2.86*  CALCIUM 7.9* 8.4* 8.7* 8.6*  PHOS 7.8*  --  6.9* 4.6    Liver Function Tests:  Recent Labs Lab 05/02/17 0900 05/04/17 1029  ALBUMIN 3.6 3.4*   No results for input(s): LIPASE, AMYLASE in the last 168 hours. No results for input(s): AMMONIA in the last 168 hours.  CBC:  Recent Labs Lab 05/01/17 0613 05/02/17 0900 05/04/17 1029  WBC 8.8 10.7 7.8  HGB 8.9* 8.9* 8.6*  HCT 26.4* 26.8* 26.0*  MCV 97.6 97.8 98.2  PLT 127* 140* 184    Cardiac Enzymes: No results for input(s): CKTOTAL, CKMB, CKMBINDEX, TROPONINI in the  last 168 hours.  BNP: Invalid input(s): POCBNP  CBG:  Recent Labs Lab 05/05/17 0734 05/05/17 1203 05/05/17 1616 05/05/17 2126 05/06/17 0818  GLUCAP 67 86 83 84 71    Microbiology: Results for orders placed or performed during the hospital encounter of 04/08/17  Culture, blood (routine x 2)     Status: None   Collection Time: 04/08/17  2:04 AM  Result Value Ref Range Status   Specimen Description BLOOD RIGHT ANTECUBITAL  Final   Special Requests   Final    BOTTLES DRAWN AEROBIC AND ANAEROBIC Blood Culture adequate volume   Culture NO GROWTH 5 DAYS  Final   Report Status 04/13/2017 FINAL  Final  Culture, blood (routine x 2)     Status: None   Collection Time: 04/08/17  2:06 AM  Result Value Ref Range Status   Specimen Description BLOOD LEFT ANTECUBITAL  Final   Special Requests   Final    BOTTLES DRAWN AEROBIC AND ANAEROBIC Blood Culture adequate volume   Culture NO GROWTH 5 DAYS  Final   Report Status 04/13/2017 FINAL  Final  C difficile quick scan w PCR reflex     Status: None   Collection Time: 04/08/17  5:09 AM  Result Value Ref Range Status   C Diff antigen  NEGATIVE NEGATIVE Final   C Diff toxin NEGATIVE NEGATIVE Final   C Diff interpretation No C. difficile detected.  Final  MRSA PCR Screening     Status: None   Collection Time: 04/08/17  6:50 AM  Result Value Ref Range Status   MRSA by PCR NEGATIVE NEGATIVE Final    Comment:        The GeneXpert MRSA Assay (FDA approved for NASAL specimens only), is one component of a comprehensive MRSA colonization surveillance program. It is not intended to diagnose MRSA infection nor to guide or monitor treatment for MRSA infections.   Urine culture     Status: None   Collection Time: 04/08/17 12:16 PM  Result Value Ref Range Status   Specimen Description URINE, RANDOM  Final   Special Requests NONE  Final   Culture   Final    NO GROWTH Performed at [facility], [address] [place], [place] [address]     Report Status 04/10/2017 FINAL  Final  Aerobic Culture (superficial specimen)     Status: None   Collection Time: 04/09/17 11:32 AM  Result Value Ref Range Status   Specimen Description SKIN  Final   Special Requests NONE  Final   Gram Stain   Final    RARE WBC PRESENT, PREDOMINANTLY PMN NO SQUAMOUS EPITHELIAL CELLS SEEN NO ORGANISMS SEEN    Culture   Final    NO GROWTH 2 DAYS Performed at [facility], [place] [address] [place], [place] [address]    Report Status 04/11/2017 FINAL  Final  Gastrointestinal Panel by PCR , Stool     Status: None   Collection Time: 04/09/17  5:10 PM  Result Value Ref Range Status   Campylobacter species NOT DETECTED NOT DETECTED Final   Plesimonas shigelloides NOT DETECTED NOT DETECTED Final   Salmonella species NOT DETECTED NOT DETECTED Final   Yersinia enterocolitica NOT DETECTED NOT DETECTED Final   Vibrio species NOT DETECTED NOT DETECTED Final   Vibrio cholerae NOT DETECTED NOT DETECTED Final   Enteroaggregative E coli (EAEC) NOT DETECTED NOT DETECTED Final   Enteropathogenic E coli (EPEC) NOT DETECTED NOT DETECTED Final   Enterotoxigenic E coli (ETEC) NOT DETECTED NOT DETECTED Final   Shiga like toxin producing E coli (STEC) NOT DETECTED NOT DETECTED Final   Shigella/Enteroinvasive E coli (EIEC) NOT DETECTED NOT DETECTED Final   Cryptosporidium NOT DETECTED NOT DETECTED Final   Cyclospora cayetanensis NOT DETECTED NOT DETECTED Final   Entamoeba histolytica NOT DETECTED NOT DETECTED Final   Giardia lamblia NOT DETECTED NOT DETECTED Final   Adenovirus F40/41 NOT DETECTED NOT DETECTED Final   Astrovirus NOT DETECTED NOT DETECTED Final   Norovirus GI/GII NOT DETECTED NOT DETECTED Final   Rotavirus A NOT DETECTED NOT DETECTED Final   Sapovirus (I, II, IV, and V) NOT DETECTED NOT DETECTED Final  Culture, respiratory (NON-Expectorated)     Status: None   Collection Time: 04/12/17 12:00 PM  Result Value Ref Range Status   Specimen  Description TRACHEAL ASPIRATE  Final   Special Requests NONE  Final   Gram Stain   Final    MODERATE WBC PRESENT,BOTH PMN AND MONONUCLEAR MODERATE YEAST Performed at [facility], [address], [place],  [address]    Culture MODERATE CANDIDA TROPICALIS  Final   Report Status 04/14/2017 FINAL  Final  Culture, respiratory (NON-Expectorated)     Status: None   Collection Time: 04/15/17  7:48 AM  Result  Value Ref Range Status   Specimen Description TRACHEAL ASPIRATE  Final   Special Requests Normal  Final   Gram Stain   Final    RARE WBC PRESENT, PREDOMINANTLY PMN FEW BUDDING YEAST SEEN Performed at [facility], [place] [address], [place], [place] [address]    Culture ABUNDANT CANDIDA TROPICALIS  Final   Report Status 04/18/2017 FINAL  Final  CULTURE, BLOOD (ROUTINE X 2) w Reflex to ID Panel     Status: None   Collection Time: 04/15/17 10:46 AM  Result Value Ref Range Status   Specimen Description BLOOD LEFT SUBCLAVIAN TRIPLE LUMEN  Final   Special Requests   Final    BOTTLES DRAWN AEROBIC AND ANAEROBIC Blood Culture results may not be optimal due to an excessive volume of blood received in culture bottles   Culture NO GROWTH 5 DAYS  Final   Report Status 04/20/2017 FINAL  Final  CULTURE, BLOOD (ROUTINE X 2) w Reflex to ID Panel     Status: None   Collection Time: 04/16/17  1:21 PM  Result Value Ref Range Status   Specimen Description BLOOD BLOOD LEFT HAND  Final   Special Requests   Final    BOTTLES DRAWN AEROBIC AND ANAEROBIC Blood Culture results may not be optimal due to an inadequate volume of blood received in culture bottles   Culture NO GROWTH 5 DAYS  Final   Report Status 04/21/2017 FINAL  Final  CULTURE, BLOOD (ROUTINE X 2) w Reflex to ID Panel     Status: None   Collection Time: 04/16/17  1:27 PM  Result Value Ref Range Status   Specimen Description BLOOD A-LINE DRAW  Final   Special Requests   Final    BOTTLES DRAWN AEROBIC AND ANAEROBIC Blood  Culture adequate volume   Culture NO GROWTH 5 DAYS  Final   Report Status 04/21/2017 FINAL  Final  C difficile quick scan w PCR reflex     Status: None   Collection Time: 04/16/17 10:15 PM  Result Value Ref Range Status   C Diff antigen NEGATIVE NEGATIVE Final   C Diff toxin NEGATIVE NEGATIVE Final   C Diff interpretation No C. difficile detected.  Final    Comment: VALID  Culture, blood (Routine X 2) w Reflex to ID Panel     Status: None   Collection Time: 04/26/17  7:30 AM  Result Value Ref Range Status   Specimen Description BLOOD PICC LINE  Final   Special Requests   Final    BOTTLES DRAWN AEROBIC AND ANAEROBIC Blood Culture adequate volume   Culture NO GROWTH 5 DAYS  Final   Report Status 05/01/2017 FINAL  Final  Culture, blood (Routine X 2) w Reflex to ID Panel     Status: None   Collection Time: 04/26/17  7:30 AM  Result Value Ref Range Status   Specimen Description BLOOD RIGHT HAND  Final   Special Requests   Final    BOTTLES DRAWN AEROBIC AND ANAEROBIC Blood Culture adequate volume   Culture NO GROWTH 5 DAYS  Final   Report Status 05/01/2017 FINAL  Final    Coagulation Studies: No results for input(s): LABPROT, INR in the last 72 hours.  Urinalysis: No results for input(s): COLORURINE, LABSPEC, PHURINE, GLUCOSEU, HGBUR, BILIRUBINUR, KETONESUR, PROTEINUR, UROBILINOGEN, NITRITE, LEUKOCYTESUR in the last 72 hours.  Invalid input(s): APPERANCEUR    Imaging: No results found.   Medications:   . albumin human     .  amiodarone  200 mg Oral BID  . apixaban  2.5 mg Oral BID  . collagenase   Topical Daily  . diltiazem  120 mg Oral Daily  . [START ON 05/07/2017] epoetin (EPOGEN/PROCRIT) injection  10,000 Units Intravenous Q M,W,F-HD  . famotidine  20 mg Oral QHS  . feeding supplement  1 Container Oral 5 X Daily  . levothyroxine  100 mcg Oral QAC breakfast  . midodrine  5 mg Oral TID WC  . multivitamin  1 tablet Oral QHS  . QUEtiapine  25 mg Oral QHS  . sertraline   25 mg Oral QHS   acetaminophen, artificial tears, heparin, ipratropium-albuterol, [DISCONTINUED] ondansetron **OR** ondansetron (ZOFRAN) IV, phenol, sodium chloride flush, zinc oxide  Assessment/ Plan:  [name] is a 66 y.o. white female  with hypertension, hypothyroidism, atrial fibrillation, who was admitted to [facility] on 04/08/2017 for evaluation of increasing lower extremity edema.  Hospital course: Admitted on 04/08/2017 with significant peripheral edema and cellulitis. Started on CRRT for hypotension and anuric renal failure on 7/2. Patient weaned off CRRT on 7/4. However later that day, patient had cardiac arrest and coded. Intubated, sedated and restarted on three vasopressors. Restart on CRRT on 7/4.  2-D echo which shows EF 25-30% Now transitioned to intermittent hemodialysis MWF  1. End Stage Renal Disease: limited hope for renal recovery.  - Serologic work up: compliments normal, negative SPEP/UPEP, ANA negative, ANCA negative, GBM negative, negative hepatitis - Patient remains anuric. Hemodialysis MWF schedule. RIJ permcath. Next treatment for Monday Outpatient planning for [place] Patient to be discharged to [facility] inpatient rehab.   2. 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 Echocardiogram with EF of 25-30% - Continue midodrine to help sustain blood pressure.  - IV albumin with HD treatment as needed  3. 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  - Continue glucose control  4. Anemia of chronic kidney disease: hemoglobin 8.6 - epo on hemodialysis treatment.    LOS: [place] 7/29/201811:00 AM

## 2017-05-06 NOTE — Progress Notes (Signed)
Patient ID: [name], female   DOB: [birth year], 66 y.o.   MRN: [id number]  [employer] PROGRESS NOTE  Carley Glendenning IRC:[id number] DOB: 1951/08/10 DOA: 04/08/2017 PCP: Tracie Harrier, MD  HPI/Subjective:   Slowly improving Laying in bed. Afebrile No pain  Objective: Vitals:   05/05/17 1326 05/06/17 0559  BP: 115/76 107/68  Pulse: (!) 103 (!) 109  Resp: 15 (!) 21  Temp: 98.3 F (36.8 C) 97.7 F (36.5 C)    Filed Weights   05/04/17 0900 05/05/17 0444 05/06/17 0500  Weight: 74.7 kg (164 lb 9.6 oz) 73.2 kg (161 lb 4.8 oz) 76.3 kg (168 lb 4.8 oz)    ROS: Review of Systems  Constitutional: Negative for chills, fever and malaise/fatigue.  HENT: Negative for hearing loss.   Eyes: Negative for blurred vision, double vision and photophobia.  Respiratory: Negative for cough, hemoptysis and shortness of breath.   Cardiovascular: Negative for chest pain, palpitations, orthopnea and leg swelling.  Gastrointestinal: Negative for abdominal pain, diarrhea and vomiting.  Genitourinary: Negative for dysuria and urgency.  Musculoskeletal: Negative for myalgias and neck pain.  Skin: Negative for rash.  Neurological: Positive for weakness. Negative for dizziness, focal weakness, seizures and headaches.  Psychiatric/Behavioral: Negative for memory loss. The patient does not have insomnia.     Exam: Physical Exam  HENT:  Head: Atraumatic.  Nose: No mucosal edema.  Mouth/Throat: No oropharyngeal exudate or posterior oropharyngeal edema.  Eyes: Pupils are equal, round, and reactive to light. Lids are normal.  Neck: Carotid bruit is not present. No thyromegaly present.  Cardiovascular: Regular rhythm, S1 normal, S2 normal and normal heart sounds.   Respiratory: She has no decreased breath sounds. She has no wheezes. She has no rales.  GI: Soft. Bowel sounds are normal. There is no tenderness.  Musculoskeletal:       Right ankle: She exhibits swelling.       Left ankle: She exhibits  swelling.  Lymphadenopathy:    She has no cervical adenopathy.  Skin: Skin is warm. No cyanosis.  Psychiatric: Her behavior is normal.      Data Reviewed: Basic Metabolic Panel:  Recent Labs Lab 04/30/17 0501 05/01/17 0613 05/02/17 0900 05/04/17 1029  NA 137 137 140 140  K 4.6 3.8 4.6 3.8  CL 98* 96* 98* 99*  CO2 26 27 28 28   GLUCOSE 66 95 74 154*  BUN 74* 47* 72* 55*  CREATININE 3.01* 2.09* 3.08* 2.86*  CALCIUM 7.9* 8.4* 8.7* 8.6*  PHOS 7.8*  --  6.9* 4.6   Liver Function Tests: CBC:  Recent Labs Lab 05/01/17 0613 05/02/17 0900 05/04/17 1029  WBC 8.8 10.7 7.8  HGB 8.9* 8.9* 8.6*  HCT 26.4* 26.8* 26.0*  MCV 97.6 97.8 98.2  PLT 127* 140* 184   BNP (last 3 results)  Recent Labs  04/08/17 0139  BNP 2,012.0*     CBG:  Recent Labs Lab 05/05/17 0734 05/05/17 1203 05/05/17 1616 05/05/17 2126 05/06/17 0818  GLUCAP 67 86 83 84 71    No results found for this or any previous visit (from the past 240 hour(s)).   Studies: No results found.  Scheduled Meds: . amiodarone  200 mg Oral BID  . apixaban  2.5 mg Oral BID  . collagenase   Topical Daily  . diltiazem  120 mg Oral Daily  . [START ON 05/07/2017] epoetin (EPOGEN/PROCRIT) injection  10,000 Units Intravenous Q M,W,F-HD  . famotidine  20 mg Oral QHS  . feeding supplement  1  Container Oral 5 X Daily  . levothyroxine  100 mcg Oral QAC breakfast  . midodrine  5 mg Oral TID WC  . multivitamin  1 tablet Oral QHS  . QUEtiapine  25 mg Oral QHS  . sertraline  25 mg Oral QHS   Continuous Infusions: . albumin human      Assessment/Plan:  # Atrial fibrillation with rapid ventricular rate - resolved Amiodarone . On cardizem. Added eliquis per cardiology  # Acute hypoxic respiratory failure on full ventilatory support. Now extubated. On room air.   # PEA arrest secondary to septic shock. Recovered well after resuscitation through ACLS protocol.  # Septic shock secondary to lower extremity  cellulitis. Off pressors. Cellulitis resolved. Antibiotics stopped.  # Shock liver secondary to septic shock, hypotension: Improving. Patient is tolerating diet.   # End stage renal disease Patient anuric. Continue hemodialysis. Tunneled catheter in place.  # Hypotension. on Midodrin  # hypothyroidism: Continue Synthroid.  #Depression: Continue Seroquel.  # Diabetes mellitus type 2, Uncontrolled with hypoglycemia Was on D10. Now stopped. Continue Accu-Cheks.  CIR when insurance approval available. Medically stable  Code Status:     Code Status Orders        Start     Ordered   04/08/17 0651  Full code  Continuous     04/08/17 0650    Code Status History    Date Active Date Inactive Code Status Order ID Comments User Context   This patient has a current code status but no historical code status.      Consultants:  Critical care specialist  Infectious disease  Nephrology  Palliative care  Cardiology  Time spent: 30 minutes.   Jackelin Correia, [employer]

## 2017-05-07 ENCOUNTER — Encounter (HOSPITAL_COMMUNITY): Payer: Self-pay | Admitting: Nurse Practitioner

## 2017-05-07 ENCOUNTER — Inpatient Hospital Stay (HOSPITAL_COMMUNITY)
Admission: RE | Admit: 2017-05-07 | Discharge: 2017-06-03 | DRG: 040 | Disposition: A | Discharge status: Other Acute Inpatient Hospital | Payer: Medicare Other | Source: Intra-hospital | Attending: Physical Medicine & Rehabilitation | Admitting: Physical Medicine & Rehabilitation

## 2017-05-07 DIAGNOSIS — L97522 Non-pressure chronic ulcer of other part of left foot with fat layer exposed: Secondary | ICD-10-CM | POA: Diagnosis not present

## 2017-05-07 DIAGNOSIS — E11649 Type 2 diabetes mellitus with hypoglycemia without coma: Secondary | ICD-10-CM | POA: Diagnosis not present

## 2017-05-07 DIAGNOSIS — I482 Chronic atrial fibrillation: Secondary | ICD-10-CM | POA: Diagnosis not present

## 2017-05-07 DIAGNOSIS — F411 Generalized anxiety disorder: Secondary | ICD-10-CM | POA: Diagnosis present

## 2017-05-07 DIAGNOSIS — G7281 Critical illness myopathy: Secondary | ICD-10-CM | POA: Diagnosis present

## 2017-05-07 DIAGNOSIS — N939 Abnormal uterine and vaginal bleeding, unspecified: Secondary | ICD-10-CM

## 2017-05-07 DIAGNOSIS — D638 Anemia in other chronic diseases classified elsewhere: Secondary | ICD-10-CM

## 2017-05-07 DIAGNOSIS — L97419 Non-pressure chronic ulcer of right heel and midfoot with unspecified severity: Secondary | ICD-10-CM | POA: Diagnosis not present

## 2017-05-07 DIAGNOSIS — E1151 Type 2 diabetes mellitus with diabetic peripheral angiopathy without gangrene: Secondary | ICD-10-CM | POA: Diagnosis present

## 2017-05-07 DIAGNOSIS — R34 Anuria and oliguria: Secondary | ICD-10-CM | POA: Diagnosis not present

## 2017-05-07 DIAGNOSIS — D62 Acute posthemorrhagic anemia: Secondary | ICD-10-CM | POA: Diagnosis not present

## 2017-05-07 DIAGNOSIS — T45515A Adverse effect of anticoagulants, initial encounter: Secondary | ICD-10-CM | POA: Diagnosis not present

## 2017-05-07 DIAGNOSIS — I959 Hypotension, unspecified: Secondary | ICD-10-CM

## 2017-05-07 DIAGNOSIS — E1122 Type 2 diabetes mellitus with diabetic chronic kidney disease: Secondary | ICD-10-CM | POA: Diagnosis not present

## 2017-05-07 DIAGNOSIS — N186 End stage renal disease: Secondary | ICD-10-CM

## 2017-05-07 DIAGNOSIS — F32A Depression, unspecified: Secondary | ICD-10-CM

## 2017-05-07 DIAGNOSIS — N179 Acute kidney failure, unspecified: Secondary | ICD-10-CM | POA: Diagnosis present

## 2017-05-07 DIAGNOSIS — I120 Hypertensive chronic kidney disease with stage 5 chronic kidney disease or end stage renal disease: Secondary | ICD-10-CM | POA: Diagnosis present

## 2017-05-07 DIAGNOSIS — N898 Other specified noninflammatory disorders of vagina: Secondary | ICD-10-CM | POA: Diagnosis not present

## 2017-05-07 DIAGNOSIS — R58 Hemorrhage, not elsewhere classified: Secondary | ICD-10-CM

## 2017-05-07 DIAGNOSIS — I951 Orthostatic hypotension: Secondary | ICD-10-CM | POA: Diagnosis not present

## 2017-05-07 DIAGNOSIS — E440 Moderate protein-calorie malnutrition: Secondary | ICD-10-CM | POA: Diagnosis present

## 2017-05-07 DIAGNOSIS — Z992 Dependence on renal dialysis: Secondary | ICD-10-CM | POA: Diagnosis not present

## 2017-05-07 DIAGNOSIS — D72819 Decreased white blood cell count, unspecified: Secondary | ICD-10-CM | POA: Diagnosis not present

## 2017-05-07 DIAGNOSIS — F330 Major depressive disorder, recurrent, mild: Secondary | ICD-10-CM | POA: Diagnosis not present

## 2017-05-07 DIAGNOSIS — I481 Persistent atrial fibrillation: Secondary | ICD-10-CM | POA: Diagnosis not present

## 2017-05-07 DIAGNOSIS — E162 Hypoglycemia, unspecified: Secondary | ICD-10-CM | POA: Diagnosis not present

## 2017-05-07 DIAGNOSIS — L98429 Non-pressure chronic ulcer of back with unspecified severity: Secondary | ICD-10-CM | POA: Diagnosis not present

## 2017-05-07 DIAGNOSIS — K5901 Slow transit constipation: Secondary | ICD-10-CM | POA: Diagnosis not present

## 2017-05-07 DIAGNOSIS — Z8674 Personal history of sudden cardiac arrest: Secondary | ICD-10-CM

## 2017-05-07 DIAGNOSIS — N184 Chronic kidney disease, stage 4 (severe): Secondary | ICD-10-CM | POA: Diagnosis not present

## 2017-05-07 DIAGNOSIS — I420 Dilated cardiomyopathy: Secondary | ICD-10-CM | POA: Diagnosis present

## 2017-05-07 DIAGNOSIS — I5022 Chronic systolic (congestive) heart failure: Secondary | ICD-10-CM | POA: Diagnosis not present

## 2017-05-07 DIAGNOSIS — D509 Iron deficiency anemia, unspecified: Secondary | ICD-10-CM | POA: Diagnosis not present

## 2017-05-07 DIAGNOSIS — I480 Paroxysmal atrial fibrillation: Secondary | ICD-10-CM | POA: Diagnosis present

## 2017-05-07 DIAGNOSIS — Z23 Encounter for immunization: Secondary | ICD-10-CM | POA: Diagnosis present

## 2017-05-07 DIAGNOSIS — N2581 Secondary hyperparathyroidism of renal origin: Secondary | ICD-10-CM | POA: Diagnosis present

## 2017-05-07 DIAGNOSIS — D708 Other neutropenia: Secondary | ICD-10-CM | POA: Diagnosis not present

## 2017-05-07 DIAGNOSIS — R1909 Other intra-abdominal and pelvic swelling, mass and lump: Secondary | ICD-10-CM | POA: Diagnosis present

## 2017-05-07 DIAGNOSIS — L97922 Non-pressure chronic ulcer of unspecified part of left lower leg with fat layer exposed: Secondary | ICD-10-CM | POA: Diagnosis present

## 2017-05-07 DIAGNOSIS — D72829 Elevated white blood cell count, unspecified: Secondary | ICD-10-CM

## 2017-05-07 DIAGNOSIS — N185 Chronic kidney disease, stage 5: Secondary | ICD-10-CM | POA: Diagnosis not present

## 2017-05-07 DIAGNOSIS — Z7902 Long term (current) use of antithrombotics/antiplatelets: Secondary | ICD-10-CM

## 2017-05-07 DIAGNOSIS — Z6820 Body mass index (BMI) 20.0-20.9, adult: Secondary | ICD-10-CM

## 2017-05-07 DIAGNOSIS — I96 Gangrene, not elsewhere classified: Secondary | ICD-10-CM | POA: Diagnosis present

## 2017-05-07 DIAGNOSIS — R8290 Unspecified abnormal findings in urine: Secondary | ICD-10-CM | POA: Diagnosis not present

## 2017-05-07 DIAGNOSIS — S90852S Superficial foreign body, left foot, sequela: Secondary | ICD-10-CM | POA: Diagnosis not present

## 2017-05-07 DIAGNOSIS — E11621 Type 2 diabetes mellitus with foot ulcer: Secondary | ICD-10-CM | POA: Diagnosis present

## 2017-05-07 DIAGNOSIS — Z79899 Other long term (current) drug therapy: Secondary | ICD-10-CM

## 2017-05-07 DIAGNOSIS — I509 Heart failure, unspecified: Secondary | ICD-10-CM

## 2017-05-07 DIAGNOSIS — E039 Hypothyroidism, unspecified: Secondary | ICD-10-CM | POA: Diagnosis present

## 2017-05-07 DIAGNOSIS — I953 Hypotension of hemodialysis: Secondary | ICD-10-CM | POA: Diagnosis not present

## 2017-05-07 DIAGNOSIS — F331 Major depressive disorder, recurrent, moderate: Secondary | ICD-10-CM | POA: Diagnosis not present

## 2017-05-07 DIAGNOSIS — F329 Major depressive disorder, single episode, unspecified: Secondary | ICD-10-CM | POA: Diagnosis present

## 2017-05-07 DIAGNOSIS — Z7901 Long term (current) use of anticoagulants: Secondary | ICD-10-CM | POA: Diagnosis not present

## 2017-05-07 DIAGNOSIS — I4891 Unspecified atrial fibrillation: Secondary | ICD-10-CM | POA: Diagnosis present

## 2017-05-07 DIAGNOSIS — F419 Anxiety disorder, unspecified: Secondary | ICD-10-CM | POA: Diagnosis not present

## 2017-05-07 DIAGNOSIS — R600 Localized edema: Secondary | ICD-10-CM | POA: Diagnosis present

## 2017-05-07 DIAGNOSIS — N390 Urinary tract infection, site not specified: Secondary | ICD-10-CM | POA: Diagnosis not present

## 2017-05-07 DIAGNOSIS — R7303 Prediabetes: Secondary | ICD-10-CM

## 2017-05-07 DIAGNOSIS — S81802A Unspecified open wound, left lower leg, initial encounter: Secondary | ICD-10-CM

## 2017-05-07 HISTORY — DX: Hypothyroidism, unspecified: E03.9

## 2017-05-07 HISTORY — DX: Cardiac arrhythmia, unspecified: I49.9

## 2017-05-07 LAB — RENAL FUNCTION PANEL
ALBUMIN: 3.5 g/dL (ref 3.5–5.0)
Anion gap: 13 (ref 5–15)
BUN: 81 mg/dL — AB (ref 6–20)
CALCIUM: 9 mg/dL (ref 8.9–10.3)
CHLORIDE: 100 mmol/L — AB (ref 101–111)
CO2: 27 mmol/L (ref 22–32)
CREATININE: 4.47 mg/dL — AB (ref 0.44–1.00)
GFR calc Af Amer: 11 mL/min — ABNORMAL LOW (ref >60)
GFR, EST NON AFRICAN AMERICAN: 9 mL/min — AB (ref >60)
Glucose, Bld: 80 mg/dL (ref 65–99)
Phosphorus: 6.9 mg/dL — ABNORMAL HIGH (ref 2.5–4.6)
Potassium: 4.8 mmol/L (ref 3.5–5.1)
Sodium: 140 mmol/L (ref 135–145)

## 2017-05-07 LAB — CBC
HCT: 27.2 % — ABNORMAL LOW (ref 35.0–47.0)
Hemoglobin: 9.1 g/dL — ABNORMAL LOW (ref 12.0–16.0)
MCH: 32.5 pg (ref 26.0–34.0)
MCHC: 33.3 g/dL (ref 32.0–36.0)
MCV: 97.5 fL (ref 80.0–100.0)
PLATELETS: 288 10*3/uL (ref 150–440)
RBC: 2.79 MIL/uL — ABNORMAL LOW (ref 3.80–5.20)
RDW: 22.4 % — AB (ref 11.5–14.5)
WBC: 13.5 10*3/uL — AB (ref 3.6–11.0)

## 2017-05-07 LAB — GLUCOSE, CAPILLARY
Glucose-Capillary: 78 mg/dL (ref 65–99)
Glucose-Capillary: 82 mg/dL (ref 65–99)
Glucose-Capillary: 77 mg/dL (ref 65–99)
Glucose-Capillary: 91 mg/dL (ref 65–99)

## 2017-05-07 MED ORDER — ALUMINUM HYDROXIDE GEL 320 MG/5ML PO SUSP
30.0000 mL | Freq: Four times a day (QID) | ORAL | Status: DC | PRN
  Filled 2017-05-07: qty 30

## 2017-05-07 MED ORDER — ACETAMINOPHEN 325 MG PO TABS
650.0000 mg | ORAL_TABLET | ORAL | Status: DC | PRN
  Administered 2017-05-25: 650 mg via ORAL
  Filled 2017-05-07 (×2): qty 2

## 2017-05-07 MED ORDER — MIDODRINE HCL 5 MG PO TABS: 5.0000 mg | ORAL_TABLET | Freq: Three times a day (TID) | ORAL | Status: DC

## 2017-05-07 MED ORDER — INSULIN ASPART 100 UNIT/ML ~~LOC~~ SOLN: 0.0000 [IU] | Freq: Every day | SUBCUTANEOUS | Status: DC

## 2017-05-07 MED ORDER — ALBUMIN HUMAN 25 % IV SOLN: 25.0000 g | INTRAVENOUS | Status: DC

## 2017-05-07 MED ORDER — RENA-VITE PO TABS
1.0000 | ORAL_TABLET | Freq: Every day | ORAL | Status: DC
  Administered 2017-05-07 – 2017-06-02 (×27): 1 via ORAL
  Filled 2017-05-07 (×25): qty 1

## 2017-05-07 MED ORDER — IPRATROPIUM-ALBUTEROL 0.5-2.5 (3) MG/3ML IN SOLN: 3.0000 mL | RESPIRATORY_TRACT | Status: DC | PRN

## 2017-05-07 MED ORDER — PRO-STAT SUGAR FREE PO LIQD
30.0000 mL | Freq: Two times a day (BID) | ORAL | Status: DC
  Administered 2017-05-07: 30 mL via ORAL

## 2017-05-07 MED ORDER — DILTIAZEM HCL ER BEADS 180 MG PO CP24: 180.0000 mg | ORAL_CAPSULE | Freq: Every day | ORAL | Status: DC

## 2017-05-07 MED ORDER — SERTRALINE HCL 50 MG PO TABS
25.0000 mg | ORAL_TABLET | Freq: Every day | ORAL | Status: DC
  Administered 2017-05-07 – 2017-06-02 (×25): 25 mg via ORAL
  Filled 2017-05-07 (×25): qty 1

## 2017-05-07 MED ORDER — COLLAGENASE 250 UNIT/GM EX OINT
TOPICAL_OINTMENT | Freq: Every day | CUTANEOUS | Status: DC
  Administered 2017-05-08 – 2017-05-27 (×16): via TOPICAL
  Filled 2017-05-07 (×3): qty 30

## 2017-05-07 MED ORDER — QUETIAPINE FUMARATE 25 MG PO TABS
25.0000 mg | ORAL_TABLET | Freq: Every day | ORAL | Status: DC
  Administered 2017-05-07 – 2017-06-02 (×24): 25 mg via ORAL
  Filled 2017-05-07 (×24): qty 1

## 2017-05-07 MED ORDER — PROCHLORPERAZINE MALEATE 5 MG PO TABS: 5.0000 mg | ORAL_TABLET | Freq: Four times a day (QID) | ORAL | Status: DC | PRN

## 2017-05-07 MED ORDER — GUAIFENESIN-DM 100-10 MG/5ML PO SYRP: 5.0000 mL | ORAL_SOLUTION | Freq: Four times a day (QID) | ORAL | Status: DC | PRN

## 2017-05-07 MED ORDER — LEVOTHYROXINE SODIUM 100 MCG PO TABS
100.0000 ug | ORAL_TABLET | Freq: Every day | ORAL | Status: DC
  Administered 2017-05-08 – 2017-06-02 (×25): 100 ug via ORAL
  Filled 2017-05-07 (×25): qty 1

## 2017-05-07 MED ORDER — FAMOTIDINE 20 MG PO TABS: 20.0000 mg | ORAL_TABLET | Freq: Every day | ORAL | Status: DC

## 2017-05-07 MED ORDER — PROCHLORPERAZINE 25 MG RE SUPP: 12.5000 mg | Freq: Four times a day (QID) | RECTAL | Status: DC | PRN

## 2017-05-07 MED ORDER — APIXABAN 2.5 MG PO TABS: 2.5000 mg | ORAL_TABLET | Freq: Two times a day (BID) | ORAL | Status: DC

## 2017-05-07 MED ORDER — POLYETHYLENE GLYCOL 3350 17 G PO PACK
17.0000 g | PACK | Freq: Every day | ORAL | Status: DC | PRN
  Administered 2017-05-07 – 2017-06-01 (×3): 17 g via ORAL
  Filled 2017-05-07 (×3): qty 1

## 2017-05-07 MED ORDER — APIXABAN 2.5 MG PO TABS
2.5000 mg | ORAL_TABLET | Freq: Two times a day (BID) | ORAL | Status: DC
  Administered 2017-05-07 – 2017-05-23 (×29): 2.5 mg via ORAL
  Filled 2017-05-07 (×29): qty 1

## 2017-05-07 MED ORDER — ONDANSETRON HCL 4 MG/2ML IJ SOLN: 4.0000 mg | Freq: Four times a day (QID) | INTRAMUSCULAR | Status: DC | PRN

## 2017-05-07 MED ORDER — FAMOTIDINE 20 MG PO TABS
20.0000 mg | ORAL_TABLET | Freq: Every day | ORAL | Status: DC
  Administered 2017-05-07 – 2017-06-02 (×25): 20 mg via ORAL
  Filled 2017-05-07 (×25): qty 1

## 2017-05-07 MED ORDER — AMIODARONE HCL 200 MG PO TABS
200.0000 mg | ORAL_TABLET | Freq: Two times a day (BID) | ORAL | Status: DC
  Administered 2017-05-08 – 2017-06-01 (×45): 200 mg via ORAL
  Filled 2017-05-07 (×46): qty 1

## 2017-05-07 MED ORDER — PNEUMOCOCCAL VAC POLYVALENT 25 MCG/0.5ML IJ INJ
0.5000 mL | INJECTION | INTRAMUSCULAR | Status: AC
  Administered 2017-05-08: 0.5 mL via INTRAMUSCULAR
  Filled 2017-05-07: qty 0.5

## 2017-05-07 MED ORDER — INSULIN ASPART 100 UNIT/ML ~~LOC~~ SOLN: 0.0000 [IU] | Freq: Three times a day (TID) | SUBCUTANEOUS | Status: DC

## 2017-05-07 MED ORDER — PROCHLORPERAZINE EDISYLATE 5 MG/ML IJ SOLN: 5.0000 mg | Freq: Four times a day (QID) | INTRAMUSCULAR | Status: DC | PRN

## 2017-05-07 MED ORDER — AMIODARONE HCL 200 MG PO TABS: 200.0000 mg | ORAL_TABLET | Freq: Two times a day (BID) | ORAL | Status: DC

## 2017-05-07 MED ORDER — ARTIFICIAL TEARS OPHTHALMIC OINT
TOPICAL_OINTMENT | OPHTHALMIC | Status: DC | PRN
  Filled 2017-05-07: qty 3.5

## 2017-05-07 MED ORDER — MIDODRINE HCL 5 MG PO TABS
5.0000 mg | ORAL_TABLET | Freq: Three times a day (TID) | ORAL | Status: DC
  Administered 2017-05-08 – 2017-05-17 (×27): 5 mg via ORAL
  Filled 2017-05-07 (×28): qty 1

## 2017-05-07 MED ORDER — DILTIAZEM HCL ER COATED BEADS 120 MG PO CP24
120.0000 mg | ORAL_CAPSULE | Freq: Every day | ORAL | Status: DC
  Administered 2017-05-08 – 2017-05-24 (×15): 120 mg via ORAL
  Filled 2017-05-07 (×19): qty 1

## 2017-05-07 MED ORDER — PHENOL 1.4 % MT LIQD: 2.0000 | OROMUCOSAL | Status: DC | PRN

## 2017-05-07 MED ORDER — BISACODYL 10 MG RE SUPP
10.0000 mg | Freq: Every day | RECTAL | Status: DC | PRN
  Administered 2017-06-01: 10 mg via RECTAL
  Filled 2017-05-07 (×2): qty 1

## 2017-05-07 MED ORDER — DARBEPOETIN ALFA 60 MCG/0.3ML IJ SOSY
60.0000 ug | PREFILLED_SYRINGE | INTRAMUSCULAR | Status: DC
  Filled 2017-05-07: qty 0.3

## 2017-05-07 MED ORDER — PRO-STAT SUGAR FREE PO LIQD
30.0000 mL | Freq: Two times a day (BID) | ORAL | Status: DC
  Administered 2017-05-07 – 2017-05-30 (×23): 30 mL via ORAL
  Filled 2017-05-07 (×44): qty 30

## 2017-05-07 MED ORDER — DIPHENHYDRAMINE HCL 12.5 MG/5ML PO ELIX: 12.5000 mg | ORAL_SOLUTION | Freq: Four times a day (QID) | ORAL | Status: DC | PRN

## 2017-05-07 MED ORDER — ZINC OXIDE 11.3 % EX CREA
TOPICAL_CREAM | Freq: Four times a day (QID) | CUTANEOUS | Status: DC | PRN
  Filled 2017-05-07: qty 56

## 2017-05-07 MED ORDER — EPOETIN ALFA 10000 UNIT/ML IJ SOLN: 10000.0000 [IU] | INTRAMUSCULAR | Status: DC

## 2017-05-07 NOTE — Progress Notes (Signed)
Nutrition Follow-up  DOCUMENTATION CODES:   Obesity unspecified  INTERVENTION:  Will discontinue Boost Breeze per patient request.  Provide Pro-Stat 30 ml po BID between meals, each supplement provides 100 kcal and 15 grams of protein. Can be mixed in 4-6 fl oz (120-180 ml) of Sprite or Ginger-ale.  Updated snacks in [employer]. Continue snacks po TID.  Continue Rena-vit QHS.  NUTRITION DIAGNOSIS:   Inadequate oral intake related to poor appetite, other (see comment) (diarrhea) as evidenced by per patient/family report, meal completion < 25%.  Ongoing inadequate intake - addressing with oral nutrition supplements.  GOAL:   Patient will meet greater than or equal to 90% of their needs  Met with calories, progressing with protein.  MONITOR:   PO intake, Supplement acceptance, Labs, Weight trends, I & O's  REASON FOR ASSESSMENT:   Ventilator, Consult Enteral/tube feeding initiation and management  ASSESSMENT:   66 y.o. white female  with hypertension, hypothyroidism, atrial fibrillation, who was admitted to [facility] on 04/08/2017 for evaluation of increasing lower extremity edema. Admitted on 04/08/2017 with significant peripheral edema and cellulitis. Started on CRRT for hypotension and anuric renal failure on 7/2. Patient weaned off CRRT on 7/4. However later that day, patient had cardiac arrest and coded. Intubated, sedated and restarted on three vasopressors. Restart on CRRT on 7/4.   -Patient transferred from ICU/CCU to [facility] unit on afternoon of 7/24. -Patient continues on intermittent HD. Has been on M/W/F schedule here. Per Case Management note on 7/27 pt's outpatient schedule will be TTS at [place]. -SLP has signed off on pt as she continues to tolerate regular texture diet with thin liquids. -Diet changed from regular to renal with 1.2 L fluid restriction on 7/25. -Plan is for patient to transfer for inpatient rehab at [facility] today after dialysis.  Spoke with patient  and her husband at bedside. Patient reports she is feeling much better. Her appetite continues to improve, but patient reports she is tired of the food here. Patient only had about 1/2 of her sausage patty on her breakfast tray this morning (did not eat the pancakes, juice, or milk). She had about 75% of her dinner last night. She reports her lunch yesterday was food brought in from home. She had ham, green beans, corn, a biscuit, and peach cobbler and finished all of that meal. She is getting tired of the snacks and would like them changed again. Also reports she is having a hard time drinking the [facility] because she is tired of them. She had 2 bottles yesterday.  Intake: In the past 24 hrs patient has had approximately 1666 kcal (98% minimum estimated kcal needs) and 73 grams of protein (81% minimum estimated protein needs) including 2 bottles of Boost Breeze (1166 kcal and 55 grams of protein from just meals/snacks).  Medications reviewed and include: famotidine, Rena-vit one tablet QHS, human albumin 25 grams IV Q M/W/F at HD.  Labs reviewed: CBG 78-96 past 24 hrs, Chloride 100, BUN 81, Creatinine 4.47, Phosphorus 6.9.  I/O: 0 ml UOP; 2 unmeasured occurrences of stool output; 2 L UF HD 7/23, 1.8 L UF HD 7/25, 2 L UF HD 7/27  Weight trend: 75.3 kg on 7/30 (-1.9 kg from assessment 7/23; -4.2 kg from admission wt of 79.5 kg on 7/1)  Diet Order:  Diet renal with fluid restriction Fluid restriction: 1200 mL Fluid; Room service appropriate? Yes; Fluid consistency: Thin  Skin:  Wound (see comment) (blisters to b/l legs, MSAD b/l buttocks, Stg  II to rectum)  Last BM:  05/06/2017 - type 6  Height:   Ht Readings from Last 1 Encounters:  04/26/17 '5\' 5"'  (1.651 m)    Weight:   Wt Readings from Last 1 Encounters:  05/07/17 166 lb (75.3 kg)    Ideal Body Weight:  56.81 kg  BMI:  Body mass index is 27.62 kg/m.  Estimated Nutritional Needs:   Kcal:  1695-1955 (MSJ x 1.3-1.5)  Protein:   90-113 grams (1.2-1.5 grams/kg)  Fluid:  UOP + 1 L  EDUCATION NEEDS:   Education needs no appropriate at this time  Willey Blade, MS, RD, LDN Pager: [phone] After Hours Pager: [phone]

## 2017-05-07 NOTE — Progress Notes (Signed)
CH made a follow up visit with pt. Pt's husband was at bedside. Pt and husband talked about things they plan to do together when pt recovers fully and the lessons they have learned through this sickness. Pt said she was doing much better and was waiting for her transport to dialysis this AM. Pt requested for prayers, which [facility] provided, with words of comfort and ministry of presence.   05/07/17 1000  Clinical Encounter Type  Visited With Patient and family together  Visit Type Spiritual support  Referral From Chaplain  Consult/Referral To [place];Other (Comment)

## 2017-05-07 NOTE — Progress Notes (Addendum)
Inpatient Rehabilitation  I have insurance authorization and a bed available to offer patient today.  I have called Colletta Maryland, RN CM and left her a voice mail to notify.  Await call back to confirm readiness and clarify HD schedule for today to plan for an anticipated transport time.  Please call with questions.   Update: I have received received acute medical clearance and discussed a hopeful 3:30 discharge following dialysis this afternoon.  Please call with questions.   Carmelia Roller., CCC/SLP Admission Coordinator  [place]  Cell [phone]

## 2017-05-07 NOTE — Progress Notes (Signed)
Patient ID: [name], female   DOB: 1951-01-11, 66 y.o.   MRN: [id number] Patient admitted to 4W06 via stretcher, escorted by [facility] staff and spouse.  Patient and spouse verbalized understanding of rehab process.  All questions answered to their satisfaction.  Patient and spouse seem extremely anxious about starting here in rehab.  Appears to be in no immediate distress at this time.  Patient has MASD to buttocks, with shearing.  RN did not undress lower extremities for evaluation since RN at [facility] had already performed dressing change earlier.  Brita Romp, RN

## 2017-05-07 NOTE — Care Management (Signed)
Patient to transfer to [facility] inpatient rehab room [phone].  Report has been called by floor RN.  [facility] has been called for transport.  RNCM signing off.

## 2017-05-07 NOTE — Discharge Summary (Signed)
[facility] NAME: [name]    MR#:  [id number]  DATE OF BIRTH:  1951/05/27  DATE OF ADMISSION:  04/08/2017 ADMITTING PHYSICIAN: Harrie Foreman, MD  DATE OF DISCHARGE: 05/07/2017  PRIMARY CARE PHYSICIAN: Tracie Harrier, MD   ADMISSION DIAGNOSIS:  Hypokalemia [E87.6] Peripheral edema [R60.9] Pleural effusion [J90] Cellulitis of lower extremity, unspecified laterality [C78.938] Sepsis, due to unspecified organism ([place]) [A41.9]  DISCHARGE DIAGNOSIS:  Active Problems:   Septic shock ([place])   Acute respiratory failure (HCC)   Acute renal failure (HCC)   Multi-organ failure with heart failure (HCC)   Palliative care by specialist   Goals of care, counseling/discussion   Cellulitis of lower extremity   Edema extremities   Pressure injury of skin   Acute renal failure (ARF) ([place])   SECONDARY DIAGNOSIS:   Past Medical History:  Diagnosis Date  . A-fib ([place])    on pradaxa  . Hypertension   . Thyroid disease      ADMITTING HISTORY  Chief Complaint: Leg swelling HPI: The patient with past medical history of hypertension, A. fib and hypothyroidism presents to the emergency department complaining of leg swelling. The patient states that her lower extremities have been swelling and becoming heavier over the last 2 weeks. Tonight she noticed that her feet were red and that her legs were so heavy that she was having difficulty walking. In the emergency department the patient was found to have tachypnea and leukocytosis which prompted initiation of septic protocol. She initially received only 500 mL normal saline bolus in light of elevated BNP. Her pressure remained low (although she is usually hypotensive relative normal values) so another liter bolus was ordered prior to the emergency department staff calling for admission.   HOSPITAL COURSE:   # Atrial fibrillation with rapid ventricular rate - resolved Amiodarone . On  cardizem. Added eliquis per cardiology Seen by Dr. Nehemiah Massed  # Acute hypoxic respiratory failure on full ventilatory support. Now extubated. On room air.   # PEA arrest secondary to septic shock. Recovered well after resuscitation through ACLS protocol.  # Septic shock secondary to lower extremity cellulitis. Off pressors. Cellulitis resolved. Antibiotics stopped.  # Shock liver secondary to septic shock, hypotension: resolved. Patient is tolerating diet.   # End stage renal disease Patient anuric. Continue hemodialysis. Tunneled catheter in place.  # Hypotension. on Midodrin  # hypothyroidism: Continue Synthroid.  #Depression: Continue Seroquel.  # Diabetes mellitus type 2, Uncontrolled with hypoglycemia Not on medications Well controlled HbA1c is 6.2  # DIC due to sepsis has resolved  Prolonged course in ICU with septic shock, AKI ending up on HD with ESRD. Doind well. Still weak and needs inpatient rehab. Stable for transfer.  CONSULTS OBTAINED:  Treatment Team:  Anthonette Legato, MD Schnier, Dolores Lory, MD Algernon Huxley, MD Corey Skains, MD  DRUG ALLERGIES:  No Known Allergies  DISCHARGE MEDICATIONS:   Current Discharge Medication List    START taking these medications   Details  amiodarone (PACERONE) 200 MG tablet Take 1 tablet (200 mg total) by mouth 2 (two) times daily.    apixaban (ELIQUIS) 2.5 MG TABS tablet Take 1 tablet (2.5 mg total) by mouth 2 (two) times daily. Qty: 60 tablet    famotidine (PEPCID) 20 MG tablet Take 1 tablet (20 mg total) by mouth at bedtime.    midodrine (PROAMATINE) 5 MG tablet Take 1 tablet (5 mg total) by mouth 3 (three) times  daily with meals.      CONTINUE these medications which have CHANGED   Details  diltiazem (TIAZAC) 180 MG 24 hr capsule Take 1 capsule (180 mg total) by mouth daily.      CONTINUE these medications which have NOT CHANGED   Details  ALPRAZolam (XANAX) 0.25 MG tablet Take 0.25 mg by  mouth at bedtime as needed for anxiety.    levothyroxine (SYNTHROID, LEVOTHROID) 50 MCG tablet Take 50 mcg by mouth daily before breakfast.    sertraline (ZOLOFT) 25 MG tablet Take 25 mg by mouth daily.      STOP taking these medications     dabigatran (PRADAXA) 150 MG CAPS capsule      furosemide (LASIX) 20 MG tablet      metoprolol tartrate (LOPRESSOR) 50 MG tablet         Today   VITAL SIGNS:  Blood pressure 110/75, pulse (!) 109, temperature 97.8 F (36.6 C), temperature source Oral, resp. rate 20, height 5\' 5"  (1.651 m), weight 75.3 kg (166 lb), SpO2 99 %.  I/O:   Intake/Output Summary (Last 24 hours) at 05/07/17 1055 Last data filed at 05/07/17 0900  Gross per 24 hour  Intake              360 ml  Output                0 ml  Net              360 ml    PHYSICAL EXAMINATION:  Physical Exam  GENERAL:  66 y.o.-year-old patient lying in the bed with no acute distress.  LUNGS: Normal breath sounds bilaterally, no wheezing, rales,rhonchi or crepitation. No use of accessory muscles of respiration.  CARDIOVASCULAR: S1, S2 normal. No murmurs, rubs, or gallops.  ABDOMEN: Soft, non-tender, non-distended. Bowel sounds present. No organomegaly or mass.  NEUROLOGIC: Moves all 4 extremities. PSYCHIATRIC: The patient is alert and oriented x 3.  SKIN: No obvious rash, lesion, or ulcer.   DATA REVIEW:   CBC  Recent Labs Lab 05/04/17 1029  WBC 7.8  HGB 8.6*  HCT 26.0*  PLT 184    Chemistries   Recent Labs Lab 05/04/17 1029  NA 140  K 3.8  CL 99*  CO2 28  GLUCOSE 154*  BUN 55*  CREATININE 2.86*  CALCIUM 8.6*    Cardiac Enzymes No results for input(s): TROPONINI in the last 168 hours.  Microbiology Results  Results for orders placed or performed during the hospital encounter of 04/08/17  Culture, blood (routine x 2)     Status: None   Collection Time: 04/08/17  2:04 AM  Result Value Ref Range Status   Specimen Description BLOOD RIGHT ANTECUBITAL  Final    Special Requests   Final    BOTTLES DRAWN AEROBIC AND ANAEROBIC Blood Culture adequate volume   Culture NO GROWTH 5 DAYS  Final   Report Status 04/13/2017 FINAL  Final  Culture, blood (routine x 2)     Status: None   Collection Time: 04/08/17  2:06 AM  Result Value Ref Range Status   Specimen Description BLOOD LEFT ANTECUBITAL  Final   Special Requests   Final    BOTTLES DRAWN AEROBIC AND ANAEROBIC Blood Culture adequate volume   Culture NO GROWTH 5 DAYS  Final   Report Status 04/13/2017 FINAL  Final  C difficile quick scan w PCR reflex     Status: None   Collection Time: 04/08/17  5:09 AM  Result Value Ref Range Status   C Diff antigen NEGATIVE NEGATIVE Final   C Diff toxin NEGATIVE NEGATIVE Final   C Diff interpretation No C. difficile detected.  Final  MRSA PCR Screening     Status: None   Collection Time: 04/08/17  6:50 AM  Result Value Ref Range Status   MRSA by PCR NEGATIVE NEGATIVE Final    Comment:        The GeneXpert MRSA Assay (FDA approved for NASAL specimens only), is one component of a comprehensive MRSA colonization surveillance program. It is not intended to diagnose MRSA infection nor to guide or monitor treatment for MRSA infections.   Urine culture     Status: None   Collection Time: 04/08/17 12:16 PM  Result Value Ref Range Status   Specimen Description URINE, RANDOM  Final   Special Requests NONE  Final   Culture   Final    NO GROWTH Performed at [facility], [address] [place], [place] [address]    Report Status 04/10/2017 FINAL  Final  Aerobic Culture (superficial specimen)     Status: None   Collection Time: 04/09/17 11:32 AM  Result Value Ref Range Status   Specimen Description SKIN  Final   Special Requests NONE  Final   Gram Stain   Final    RARE WBC PRESENT, PREDOMINANTLY PMN NO SQUAMOUS EPITHELIAL CELLS SEEN NO ORGANISMS SEEN    Culture   Final    NO GROWTH 2 DAYS Performed at [facility], [place] [address],  [place], [place] [address]    Report Status 04/11/2017 FINAL  Final  Gastrointestinal Panel by PCR , Stool     Status: None   Collection Time: 04/09/17  5:10 PM  Result Value Ref Range Status   Campylobacter species NOT DETECTED NOT DETECTED Final   Plesimonas shigelloides NOT DETECTED NOT DETECTED Final   Salmonella species NOT DETECTED NOT DETECTED Final   Yersinia enterocolitica NOT DETECTED NOT DETECTED Final   Vibrio species NOT DETECTED NOT DETECTED Final   Vibrio cholerae NOT DETECTED NOT DETECTED Final   Enteroaggregative E coli (EAEC) NOT DETECTED NOT DETECTED Final   Enteropathogenic E coli (EPEC) NOT DETECTED NOT DETECTED Final   Enterotoxigenic E coli (ETEC) NOT DETECTED NOT DETECTED Final   Shiga like toxin producing E coli (STEC) NOT DETECTED NOT DETECTED Final   Shigella/Enteroinvasive E coli (EIEC) NOT DETECTED NOT DETECTED Final   Cryptosporidium NOT DETECTED NOT DETECTED Final   Cyclospora cayetanensis NOT DETECTED NOT DETECTED Final   Entamoeba histolytica NOT DETECTED NOT DETECTED Final   Giardia lamblia NOT DETECTED NOT DETECTED Final   Adenovirus F40/41 NOT DETECTED NOT DETECTED Final   Astrovirus NOT DETECTED NOT DETECTED Final   Norovirus GI/GII NOT DETECTED NOT DETECTED Final   Rotavirus A NOT DETECTED NOT DETECTED Final   Sapovirus (I, II, IV, and V) NOT DETECTED NOT DETECTED Final  Culture, respiratory (NON-Expectorated)     Status: None   Collection Time: 04/12/17 12:00 PM  Result Value Ref Range Status   Specimen Description TRACHEAL ASPIRATE  Final   Special Requests NONE  Final   Gram Stain   Final    MODERATE WBC PRESENT,BOTH PMN AND MONONUCLEAR MODERATE YEAST Performed at [facility], [address], [place], [place] [address]    Culture MODERATE CANDIDA TROPICALIS  Final   Report Status 04/14/2017 FINAL  Final  Culture, respiratory (NON-Expectorated)     Status: None  Collection Time: 04/15/17  7:48 AM  Result Value Ref Range Status    Specimen Description TRACHEAL ASPIRATE  Final   Special Requests Normal  Final   Gram Stain   Final    RARE WBC PRESENT, PREDOMINANTLY PMN FEW BUDDING YEAST SEEN Performed at [facility] [address] [place], [place] [address]    Culture ABUNDANT CANDIDA TROPICALIS  Final   Report Status 04/18/2017 FINAL  Final  CULTURE, BLOOD (ROUTINE X 2) w Reflex to ID Panel     Status: None   Collection Time: 04/15/17 10:46 AM  Result Value Ref Range Status   Specimen Description BLOOD LEFT SUBCLAVIAN TRIPLE LUMEN  Final   Special Requests   Final    BOTTLES DRAWN AEROBIC AND ANAEROBIC Blood Culture results may not be optimal due to an excessive volume of blood received in culture bottles   Culture NO GROWTH 5 DAYS  Final   Report Status 04/20/2017 FINAL  Final  CULTURE, BLOOD (ROUTINE X 2) w Reflex to ID Panel     Status: None   Collection Time: 04/16/17  1:21 PM  Result Value Ref Range Status   Specimen Description BLOOD BLOOD LEFT HAND  Final   Special Requests   Final    BOTTLES DRAWN AEROBIC AND ANAEROBIC Blood Culture results may not be optimal due to an inadequate volume of blood received in culture bottles   Culture NO GROWTH 5 DAYS  Final   Report Status 04/21/2017 FINAL  Final  CULTURE, BLOOD (ROUTINE X 2) w Reflex to ID Panel     Status: None   Collection Time: 04/16/17  1:27 PM  Result Value Ref Range Status   Specimen Description BLOOD A-LINE DRAW  Final   Special Requests   Final    BOTTLES DRAWN AEROBIC AND ANAEROBIC Blood Culture adequate volume   Culture NO GROWTH 5 DAYS  Final   Report Status 04/21/2017 FINAL  Final  C difficile quick scan w PCR reflex     Status: None   Collection Time: 04/16/17 10:15 PM  Result Value Ref Range Status   C Diff antigen NEGATIVE NEGATIVE Final   C Diff toxin NEGATIVE NEGATIVE Final   C Diff interpretation No C. difficile detected.  Final    Comment: VALID  Culture, blood (Routine X 2) w Reflex to ID Panel     Status: None    Collection Time: 04/26/17  7:30 AM  Result Value Ref Range Status   Specimen Description BLOOD PICC LINE  Final   Special Requests   Final    BOTTLES DRAWN AEROBIC AND ANAEROBIC Blood Culture adequate volume   Culture NO GROWTH 5 DAYS  Final   Report Status 05/01/2017 FINAL  Final  Culture, blood (Routine X 2) w Reflex to ID Panel     Status: None   Collection Time: 04/26/17  7:30 AM  Result Value Ref Range Status   Specimen Description BLOOD RIGHT HAND  Final   Special Requests   Final    BOTTLES DRAWN AEROBIC AND ANAEROBIC Blood Culture adequate volume   Culture NO GROWTH 5 DAYS  Final   Report Status 05/01/2017 FINAL  Final    RADIOLOGY:  No results found.  Follow up with PCP in 1 week.  Management plans discussed with the patient, family and they are in agreement.  CODE STATUS:     Code Status Orders        Start     Ordered  05/01/17 1148  Full code  Continuous     05/01/17 1147    Code Status History    Date Active Date Inactive Code Status Order ID Comments User Context   04/19/2017 10:50 AM 05/01/2017 11:47 AM DNR [id number]  Flora Lipps, MD Inpatient   04/08/2017  6:50 AM 04/19/2017 10:50 AM Full Code [id number]  Harrie Foreman, MD Inpatient    Advance Directive Documentation     Most Recent Value  Type of Advance Directive  Living will  Pre-existing out of facility DNR order (yellow form or pink MOST form)  -  "MOST" Form in Place?  -      TOTAL TIME TAKING CARE OF THIS PATIENT ON DAY OF DISCHARGE: more than 30 minutes.   Hillary Bow R M.D on 05/07/2017 at 10:55 AM  Between 7am to 6pm - Pager - [phone]  After 6pm go to [email or web address] - password [email or web address] [facility]  Office  [phone]  CC: Primary care physician; Tracie Harrier, MD  Note: This dictation was prepared with Dragon dictation along with smaller phrase technology. Any transcriptional errors that result from this process are unintentional.

## 2017-05-07 NOTE — Progress Notes (Signed)
OT Cancellation Note  Patient Details Name: [name] MRN: [id number] DOB: October 12, 1950   Cancelled Treatment:    Reason Eval/Treat Not Completed: Patient declined, no reason specified. Pt preparing to be taken to dialysis soon and then planning to transfer to [facility] in-patient rehab for continued therapy. Pt politely declined OT treatment session this morning, stating that it was going to be a very busy day. Will continue to follow and re-attempt OT treatment as appropriate.  Jeni Salles, MPH, MS, OTR/L ascom [phone] 05/07/17, 11:33 AM

## 2017-05-07 NOTE — Progress Notes (Signed)
Post hd assessment 

## 2017-05-07 NOTE — Care Management Important Message (Signed)
Important Message  Patient Details  Name: [name] MRN: [id number] Date of Birth: 1950-10-21   Medicare Important Message Given:  Yes    Beverly Sessions, RN 05/07/2017, 12:24 PM

## 2017-05-07 NOTE — Progress Notes (Signed)
Pre hd info 

## 2017-05-07 NOTE — Progress Notes (Signed)
Hd start, pt alert, no c/o, stable, d/c after hd

## 2017-05-07 NOTE — Progress Notes (Signed)
[name], MD Physician Signed Physical Medicine and Rehabilitation  PMR Pre-admission Date of Service: 05/03/2017 1:36 PM  Related encounter: ED to Hosp-Admission (Current) from 04/08/2017 in [facility]       '[]' Hide copied text   Secondary Market PMR Admission Coordinator Pre-Admission Assessment  Patient: [name] is an 66 y.o., female MRN: [id number] DOB: 1950-11-10 Height: '5\' 5"'  (165.1 cm) Weight: 75.3 kg (166 lb)  Insurance Information HMO:     PPO:      PCP:      IPA:      80/20:      OTHER: Georgina Pillion PRIMARY: UHC Medicare       Policy#: [id number]      Subscriber: Self CM Name: Vevelyn Royals      Phone#: [phone]     Fax#: [phone] Pre-Cert#: [id number]      Employer: Retired  Benefits:  Phone #: verified online      Name:  Irene Shipper. Date: 02/06/17     Deduct: $0      Out of Pocket Max: $4,000      Life Max: N/A CIR: $160 a day, days 1-10; $0 a day, days 11+      SNF: $0 a day, days 1-20; $50 a day days 21-100 Outpatient: PT/OT/SLP     Co-Pay: $20 per visit  Home Health: PT/OT/SLP 100%      Co-Pay: None DME: 80%      Co-Pay: 20% Providers: In-network   Medicaid Application Date:       Case Manager:  Disability Application Date:       Case Worker:   Emergency Tax adviser Information    Name Relation Home Work Mobile   Buczynski,[name] Spouse [phone]        Current Medical History  Patient Admitting Diagnosis: Critical illness myopathy   History of Present Illness: Ms.Shannon Hensleyis a 66 y.o.white femalewith hypertension, hypothyroidism, atrial fibrillation, who was admitted to [facility] on 7/1/2018for evaluation of increasing lower extremity edema. She was found to have cellulitis upon admission. Started on CRRT for hypotension and anuric renal failure on 7/2. Patient weaned off CRRT on 7/4. However later that day, patient had cardiac arrest and coded. Intubated, sedated and restarted  on three vasopressors. Restart on CRRT on 7/4. Now transitioned to intermittent hemodialysis M,W,F, with outpatient set-up with [facility] in [place] for T,Th,Sat. Hypotension with atrial fibrillation with rapid ventricular response and anasarca. 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 Echocardiogram with EF of 25-30%.  Midodrine to help sustain blood pressure and IV albumin with HD treatment as needed. 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  Therapies initiated with recommendation for follow-up CIR level of therapies.  Patient with significant debility impacting her ability to complete basic self-care tasks following her multiple medical issues.  Patient admitted to [facility] IP Rehab 05/07/17.   Patient's medical record from [facility] has been reviewed by the rehabilitation admission coordinator and physician.   Past Medical History      Past Medical History:  Diagnosis Date  . A-fib ([address])    on pradaxa  . Hypertension   . Thyroid disease     Family History   family history includes AAA (abdominal aortic aneurysm) in her mother.  Prior Rehab/Hospitalizations Has the patient had major surgery during 100 days prior to admission? No  Current Medications Refer to [facility] MAR  Patients Current Diet:  Renal diet restrictions   Precautions / Restrictions Precautions Precautions: Fall Precaution Comments: R IJ perm cath Restrictions Weight Bearing Restrictions: No   Has the patient had 2 or more falls or a fall with injury in the past year?No  Prior Activity Level Community (5-7x/wk): Prior to admission patient was fully independent and active. She enjoyed shopping, the beach, and visiting with friends and family.   Prior Functional Level Self Care: Did the patient need help bathing, dressing, using the toilet or eating? Independent  Indoor  Mobility: Did the patient need assistance with walking from room to room (with or without device)? Independent  Stairs: Did the patient need assistance with internal or external stairs (with or without device)? Independent  Functional Cognition: Did the patient need help planning regular tasks such as shopping or remembering to take medications? Independent  Home Assistive Devices / Equipment Home Assistive Devices/Equipment: None Home Equipment: None  Prior Device Use: Indicate devices/aids used by the patient prior to current illness, exacerbation or injury? None of the above   Prior Functional Level Current Functional Level  Bed Mobility  Independent   Supine to sit Mod-Max A; Sit to supine Max-Total A   Transfers  Independent   Sit to stand Max A +2   Mobility - Walk/Wheelchair  Independent   Not yet assessed    Upper Body Dressing  Independent   Max A   Lower Body Dressing  Independent   MaxTR A   Grooming  Independent   Supervision bed level    Eating/Drinking  Independent   Supervision bed level    Toilet Transfer  Independent   Max A +2   Bladder Continence   Yes   Continent    Bowel Management  Continent    Incontinent    Stair Climbing  Independent    Not yet assessed    Communication  Independent    Mod I    Memory  Independent    Mod I    Cooking/Meal Prep  Independent       Housework  Independent     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    Driving  Yes, Independent       Special needs/care consideration BiPAP/CPAP: No CPM: No Continuous Drip IV: No Dialysis: Yes, new to HD        Days: M,W,F (currently)    Outpatient: HD outpatient set-up with [facility] in [place] T,Th,Sat 6:40 am contact is Elvera Bicker [phone]  Life Vest:No Oxygen: None Special Bed: No Trach Size: No Wound Vac (area): No       Skin: Bruising right neck,  abdomen; Cellulitis and blisters bilateral lower legs and feet; Moisture associated skin damage, stage II bilateral buttocks                              Bowel mgmt: Incontinent, last BM 05/06/17  Bladder mgmt: Continent  Diabetic mgmt: No, monitoring for hypoglycemia   Previous Home Environment Living Arrangements: Spouse/significant other  Lives With: Spouse Available Help at Discharge: Family, Available 24 hours/day Type of Home: House Home Layout: One level Home Access: Stairs to enter Entrance Stairs-Rails: Right Entrance Stairs-Number of Steps: 4 Bathroom Shower/Tub: Multimedia programmer: Standard Bathroom Accessibility: Yes How Accessible: Accessible via walker [place]: No Additional Comments: N/A  Discharge Living Setting Plans for Discharge Living  Setting: Patient's home, Lives with (comment) (Spouse, Kyonna Frier [phone]) Type of Home at Discharge: House Discharge Home Layout: One level Discharge Home Access: Stairs to enter Entrance Stairs-Rails: Right, Left, Can reach both Entrance Stairs-Number of Steps: 3-4 Discharge Bathroom Shower/Tub: Tub/shower unit, Walk-in shower (both are available ) Discharge Bathroom Toilet: Standard Discharge Bathroom Accessibility: Yes How Accessible: Accessible via walker Does the patient have any problems obtaining your medications?: No  Social/Family/Support Systems Patient Roles: Spouse, Parent, Other (Comment) (friend and family member ) Contact Information: Spouse Mckinzee Spirito [phone] Anticipated Caregiver: Spouse  Anticipated Caregiver's Contact Information: see above  Ability/Limitations of Caregiver: he works part time but will arrange care/provide 24/7 Caregiver Availability: 24/7 Discharge Plan Discussed with Primary Caregiver: Yes Is Caregiver In Agreement with Plan?: Yes Does Caregiver/Family have Issues with Lodging/Transportation while Pt is in Rehab?: No  Goals/Additional  Needs Patient/Family Goal for Rehab: PT/OT Min A Expected length of stay: 19-21 days  Cultural Considerations: Christian  Dietary Needs: Renal diet restrictions  Equipment Needs: TBD Special Service Needs: New HD patient and will need renal diet education with patient and spouse  Additional Information: Patient and spouse are from [facility] Pt/Family Agrees to Admission and willing to participate: Yes Program Orientation Provided & Reviewed with Pt/Caregiver Including Roles  & Responsibilities: Yes Additional Information Needs: Dietician consult  Information Needs to be Provided By: Medical team   Patient Condition: I have reviewed patient's medical record and met with the patient and her spouse at the bedside. Patient is eager to get to IP Rehab to facilitate her safe transition home. Given that this patient was fully independent and active prior to admission.  Currently she is requiring Mod-Total A for basic self-care task.  As a result, she makes an excellent candidate for an IP Rehab admission.  Additionally, she has skin break down and is requiring HD as a result for her critical illness and she would greatly benefit from the close medical management.  The coordinated care of the IP Rehab team will provide her with 3 hours of therapy 5/7 days of the week, as well as 24/7 skilled nursing care and daily doctors visits for management of her new medical conditions.  Plan to proceed with admission today, 05/07/17.  Preadmission Screen Completed By:  Gunnar Fusi, 05/07/2017 10:21 AM ______________________________________________________________________   Discussed status with Dr. Posey Pronto on 05/07/17 at 73 and received telephone approval for admission today.  Admission Coordinator:  Gunnar Fusi, time 1150/Date 05/07/17   Assessment/Plan: Diagnosis: Critical illness myopathy  1. Does the need for close, 24 hr/day  Medical supervision in concert with the patient's rehab needs make it  unreasonable for this patient to be served in a less intensive setting? Yes  2. Co-Morbidities requiring supervision/potential complications: hypertension, hypothyroidism, atrial fibrillation, 3. Due to safety, disease management and patient education, does the patient require 24 hr/day rehab nursing? Yes 4. Does the patient require coordinated care of a physician, rehab nurse, PT (1-2 hrs/day, 5 days/week) and OT (1-2 hrs/day, 5 days/week) to address physical and functional deficits in the context of the above medical diagnosis(es)? Yes Addressing deficits in the following areas: balance, endurance, locomotion, strength, transferring, bathing, dressing, toileting and psychosocial support 5. Can the patient actively participate in an intensive therapy program of at least 3 hrs of therapy 5 days a week? Yes 6. The potential for patient to make measurable gains while on inpatient rehab is excellent 7. Anticipated functional outcomes upon discharge from inpatients are: min assist  PT, min assist and mod assist OT, n/a SLP 8. Estimated rehab length of stay to reach the above functional goals is: 18-22 days. 9. Does the patient have adequate social supports to accommodate these discharge functional goals? Potentially 10. Anticipated D/C setting: Other 11. Anticipated post D/C treatments: HH therapy and Home excercise program 12. Overall Rehab/Functional Prognosis: good    RECOMMENDATIONS: This patient's condition is appropriate for continued rehabilitative care in the following setting: CIR Patient has agreed to participate in recommended program. Yes Note that insurance prior authorization may be required for reimbursement for recommended care.  Comment:Rehab Admissions Coordinator to follow up.  Delice Lesch, MD, Mellody Drown Gunnar Fusi 05/07/2017    Revision History

## 2017-05-07 NOTE — Progress Notes (Signed)
Post hd vitals 

## 2017-05-07 NOTE — Care Management (Signed)
Insurance Josem Kaufmann has been obtained for CIR.  Per Dr. Candiss Norse Plan for patient to have inpatient HD from 1pm-3pm then discharge to inpatient Rehab at [facility].  Will Transport via [facility].

## 2017-05-07 NOTE — Progress Notes (Signed)
  End of hd 

## 2017-05-07 NOTE — Progress Notes (Signed)
[facility]  ROUNDING NOTE   Subjective:   Patient is doing well today No shortness of breath Able to eat some   Objective:  Vital signs in last 24 hours:  Temp:  [97.8 F (36.6 C)-98.1 F (36.7 C)] 97.8 F (36.6 C) (07/30 0410) Pulse Rate:  [105-109] 109 (07/30 0410) Resp:  [18-20] 20 (07/30 0410) BP: (108-110)/(74-75) 110/75 (07/30 0410) SpO2:  [99 %-100 %] 99 % (07/30 0410) Weight:  [75.3 kg (166 lb)] 75.3 kg (166 lb) (07/30 0500)  Weight change: -1.043 kg (-2 lb 4.8 oz) Filed Weights   05/05/17 0444 05/06/17 0500 05/07/17 0500  Weight: 73.2 kg (161 lb 4.8 oz) 76.3 kg (168 lb 4.8 oz) 75.3 kg (166 lb)    Intake/Output: I/O last 3 completed shifts: In: 480 [P.O.:480] Out: 0    Intake/Output this shift:  No intake/output data recorded.  Physical Exam: General: Sitting in bed  Head: Normocephalic, atraumatic  Eyes: Anicteric  Neck: supple  Lungs:  clear  Heart: Irregular  Abdomen:  Soft, nontender, obese  Extremities: bilateral trace to 1+ lower extremity edema   Neurologic: Awake, alert, following commands   Skin: Warm, dry  Access: Right IJ permcath 7/19 Dr. Lucky Cowboy    Basic Metabolic Panel:  Recent Labs Lab 05/01/17 0613 05/02/17 0900 05/04/17 1029 05/07/17 1127  NA 137 140 140 140  K 3.8 4.6 3.8 4.8  CL 96* 98* 99* 100*  CO2 27 28 28 27   GLUCOSE 95 74 154* 80  BUN 47* 72* 55* 81*  CREATININE 2.09* 3.08* 2.86* 4.47*  CALCIUM 8.4* 8.7* 8.6* 9.0  PHOS  --  6.9* 4.6 6.9*    Liver Function Tests:  Recent Labs Lab 05/02/17 0900 05/04/17 1029 05/07/17 1127  ALBUMIN 3.6 3.4* 3.5   No results for input(s): LIPASE, AMYLASE in the last 168 hours. No results for input(s): AMMONIA in the last 168 hours.  CBC:  Recent Labs Lab 05/01/17 0613 05/02/17 0900 05/04/17 1029 05/07/17 1127  WBC 8.8 10.7 7.8 13.5*  HGB 8.9* 8.9* 8.6* 9.1*  HCT 26.4* 26.8* 26.0* 27.2*  MCV 97.6 97.8 98.2 97.5  PLT 127* 140* 184 288    Cardiac  Enzymes: No results for input(s): CKTOTAL, CKMB, CKMBINDEX, TROPONINI in the last 168 hours.  BNP: Invalid input(s): POCBNP  CBG:  Recent Labs Lab 05/06/17 1209 05/06/17 1730 05/06/17 2157 05/07/17 0748 05/07/17 1200  GLUCAP 94 96 80 78 82    Microbiology: Results for orders placed or performed during the hospital encounter of 04/08/17  Culture, blood (routine x 2)     Status: None   Collection Time: 04/08/17  2:04 AM  Result Value Ref Range Status   Specimen Description BLOOD RIGHT ANTECUBITAL  Final   Special Requests   Final    BOTTLES DRAWN AEROBIC AND ANAEROBIC Blood Culture adequate volume   Culture NO GROWTH 5 DAYS  Final   Report Status 04/13/2017 FINAL  Final  Culture, blood (routine x 2)     Status: None   Collection Time: 04/08/17  2:06 AM  Result Value Ref Range Status   Specimen Description BLOOD LEFT ANTECUBITAL  Final   Special Requests   Final    BOTTLES DRAWN AEROBIC AND ANAEROBIC Blood Culture adequate volume   Culture NO GROWTH 5 DAYS  Final   Report Status 04/13/2017 FINAL  Final  C difficile quick scan w PCR reflex     Status: None   Collection Time: 04/08/17  5:09 AM  Result Value Ref Range Status   C Diff antigen NEGATIVE NEGATIVE Final   C Diff toxin NEGATIVE NEGATIVE Final   C Diff interpretation No C. difficile detected.  Final  MRSA PCR Screening     Status: None   Collection Time: 04/08/17  6:50 AM  Result Value Ref Range Status   MRSA by PCR NEGATIVE NEGATIVE Final    Comment:        The GeneXpert MRSA Assay (FDA approved for NASAL specimens only), is one component of a comprehensive MRSA colonization surveillance program. It is not intended to diagnose MRSA infection nor to guide or monitor treatment for MRSA infections.   Urine culture     Status: None   Collection Time: 04/08/17 12:16 PM  Result Value Ref Range Status   Specimen Description URINE, RANDOM  Final   Special Requests NONE  Final   Culture   Final    NO  GROWTH Performed at [facility], [address] [place], [place] [address]    Report Status 04/10/2017 FINAL  Final  Aerobic Culture (superficial specimen)     Status: None   Collection Time: 04/09/17 11:32 AM  Result Value Ref Range Status   Specimen Description SKIN  Final   Special Requests NONE  Final   Gram Stain   Final    RARE WBC PRESENT, PREDOMINANTLY PMN NO SQUAMOUS EPITHELIAL CELLS SEEN NO ORGANISMS SEEN    Culture   Final    NO GROWTH 2 DAYS Performed at [facility], [place] [address] [place], [place] [address]    Report Status 04/11/2017 FINAL  Final  Gastrointestinal Panel by PCR , Stool     Status: None   Collection Time: 04/09/17  5:10 PM  Result Value Ref Range Status   Campylobacter species NOT DETECTED NOT DETECTED Final   Plesimonas shigelloides NOT DETECTED NOT DETECTED Final   Salmonella species NOT DETECTED NOT DETECTED Final   Yersinia enterocolitica NOT DETECTED NOT DETECTED Final   Vibrio species NOT DETECTED NOT DETECTED Final   Vibrio cholerae NOT DETECTED NOT DETECTED Final   Enteroaggregative E coli (EAEC) NOT DETECTED NOT DETECTED Final   Enteropathogenic E coli (EPEC) NOT DETECTED NOT DETECTED Final   Enterotoxigenic E coli (ETEC) NOT DETECTED NOT DETECTED Final   Shiga like toxin producing E coli (STEC) NOT DETECTED NOT DETECTED Final   Shigella/Enteroinvasive E coli (EIEC) NOT DETECTED NOT DETECTED Final   Cryptosporidium NOT DETECTED NOT DETECTED Final   Cyclospora cayetanensis NOT DETECTED NOT DETECTED Final   Entamoeba histolytica NOT DETECTED NOT DETECTED Final   Giardia lamblia NOT DETECTED NOT DETECTED Final   Adenovirus F40/41 NOT DETECTED NOT DETECTED Final   Astrovirus NOT DETECTED NOT DETECTED Final   Norovirus GI/GII NOT DETECTED NOT DETECTED Final   Rotavirus A NOT DETECTED NOT DETECTED Final   Sapovirus (I, II, IV, and V) NOT DETECTED NOT DETECTED Final  Culture, respiratory (NON-Expectorated)     Status: None    Collection Time: 04/12/17 12:00 PM  Result Value Ref Range Status   Specimen Description TRACHEAL ASPIRATE  Final   Special Requests NONE  Final   Gram Stain   Final    MODERATE WBC PRESENT,BOTH PMN AND MONONUCLEAR MODERATE YEAST Performed at [facility], [address], [place], [place] [address]    Culture MODERATE CANDIDA TROPICALIS  Final   Report Status 04/14/2017 FINAL  Final  Culture, respiratory (NON-Expectorated)     Status: None  Collection Time: 04/15/17  7:48 AM  Result Value Ref Range Status   Specimen Description TRACHEAL ASPIRATE  Final   Special Requests Normal  Final   Gram Stain   Final    RARE WBC PRESENT, PREDOMINANTLY PMN FEW BUDDING YEAST SEEN Performed at [facility] [address], [place], [place] [address]    Culture ABUNDANT CANDIDA TROPICALIS  Final   Report Status 04/18/2017 FINAL  Final  CULTURE, BLOOD (ROUTINE X 2) w Reflex to ID Panel     Status: None   Collection Time: 04/15/17 10:46 AM  Result Value Ref Range Status   Specimen Description BLOOD LEFT SUBCLAVIAN TRIPLE LUMEN  Final   Special Requests   Final    BOTTLES DRAWN AEROBIC AND ANAEROBIC Blood Culture results may not be optimal due to an excessive volume of blood received in culture bottles   Culture NO GROWTH 5 DAYS  Final   Report Status 04/20/2017 FINAL  Final  CULTURE, BLOOD (ROUTINE X 2) w Reflex to ID Panel     Status: None   Collection Time: 04/16/17  1:21 PM  Result Value Ref Range Status   Specimen Description BLOOD BLOOD LEFT HAND  Final   Special Requests   Final    BOTTLES DRAWN AEROBIC AND ANAEROBIC Blood Culture results may not be optimal due to an inadequate volume of blood received in culture bottles   Culture NO GROWTH 5 DAYS  Final   Report Status 04/21/2017 FINAL  Final  CULTURE, BLOOD (ROUTINE X 2) w Reflex to ID Panel     Status: None   Collection Time: 04/16/17  1:27 PM  Result Value Ref Range Status   Specimen Description BLOOD A-LINE DRAW  Final    Special Requests   Final    BOTTLES DRAWN AEROBIC AND ANAEROBIC Blood Culture adequate volume   Culture NO GROWTH 5 DAYS  Final   Report Status 04/21/2017 FINAL  Final  C difficile quick scan w PCR reflex     Status: None   Collection Time: 04/16/17 10:15 PM  Result Value Ref Range Status   C Diff antigen NEGATIVE NEGATIVE Final   C Diff toxin NEGATIVE NEGATIVE Final   C Diff interpretation No C. difficile detected.  Final    Comment: VALID  Culture, blood (Routine X 2) w Reflex to ID Panel     Status: None   Collection Time: 04/26/17  7:30 AM  Result Value Ref Range Status   Specimen Description BLOOD PICC LINE  Final   Special Requests   Final    BOTTLES DRAWN AEROBIC AND ANAEROBIC Blood Culture adequate volume   Culture NO GROWTH 5 DAYS  Final   Report Status 05/01/2017 FINAL  Final  Culture, blood (Routine X 2) w Reflex to ID Panel     Status: None   Collection Time: 04/26/17  7:30 AM  Result Value Ref Range Status   Specimen Description BLOOD RIGHT HAND  Final   Special Requests   Final    BOTTLES DRAWN AEROBIC AND ANAEROBIC Blood Culture adequate volume   Culture NO GROWTH 5 DAYS  Final   Report Status 05/01/2017 FINAL  Final    Coagulation Studies: No results for input(s): LABPROT, INR in the last 72 hours.  Urinalysis: No results for input(s): COLORURINE, LABSPEC, PHURINE, GLUCOSEU, HGBUR, BILIRUBINUR, KETONESUR, PROTEINUR, UROBILINOGEN, NITRITE, LEUKOCYTESUR in the last 72 hours.  Invalid input(s): APPERANCEUR    Imaging: No results found.   Medications:   .  albumin human     . amiodarone  200 mg Oral BID  . apixaban  2.5 mg Oral BID  . collagenase   Topical Daily  . diltiazem  120 mg Oral Daily  . epoetin (EPOGEN/PROCRIT) injection  10,000 Units Intravenous Q M,W,F-HD  . famotidine  20 mg Oral QHS  . feeding supplement (PRO-STAT SUGAR FREE 64)  30 mL Oral BID  . levothyroxine  100 mcg Oral QAC breakfast  . midodrine  5 mg Oral TID WC  . multivitamin   1 tablet Oral QHS  . QUEtiapine  25 mg Oral QHS  . sertraline  25 mg Oral QHS   acetaminophen, artificial tears, heparin, ipratropium-albuterol, [DISCONTINUED] ondansetron **OR** ondansetron (ZOFRAN) IV, phenol, sodium chloride flush, zinc oxide  Assessment/ Plan:  [name] is a 66 y.o. white female  with hypertension, hypothyroidism, atrial fibrillation, who was admitted to [facility] on 04/08/2017 for evaluation of increasing lower extremity edema.  Hospital course: Admitted on 04/08/2017 with significant peripheral edema and cellulitis. Started on CRRT for hypotension and anuric renal failure on 7/2. Patient weaned off CRRT on 7/4. However later that day, patient had cardiac arrest and coded. Intubated, sedated and restarted on vasopressors. Restart on CRRT on 7/4.  2-D echo which shows EF 25-30% Now transitioned to intermittent hemodialysis MWF  1. End Stage Renal Disease: limited hope for renal recovery.  - Serologic work up: compliments normal, negative SPEP/UPEP, ANA negative, ANCA negative, GBM negative, negative hepatitis - Hemodialysis MWF schedule. RIJ permcath.  Outpatient planning for [facility]./ Dr Holley Raring Patient to be discharged to [facility] inpatient rehab after HD today  2. 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 Echocardiogram with EF of 25-30% - Continue midodrine to help sustain blood pressure.   3. 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  - Continue glucose control - Diet controlled at present  4. Anemia of chronic kidney disease: hemoglobin 9.1 - epo on hemodialysis treatment.   5. A Fib - Rate control agents - Eliquis    LOS: [place] 7/30/20181:30 PM

## 2017-05-08 ENCOUNTER — Inpatient Hospital Stay (HOSPITAL_COMMUNITY): Payer: Medicare Other | Admitting: Physical Therapy

## 2017-05-08 ENCOUNTER — Inpatient Hospital Stay (HOSPITAL_COMMUNITY): Payer: Medicare Other | Admitting: Occupational Therapy

## 2017-05-08 DIAGNOSIS — L97419 Non-pressure chronic ulcer of right heel and midfoot with unspecified severity: Secondary | ICD-10-CM

## 2017-05-08 DIAGNOSIS — F329 Major depressive disorder, single episode, unspecified: Secondary | ICD-10-CM

## 2017-05-08 DIAGNOSIS — D72829 Elevated white blood cell count, unspecified: Secondary | ICD-10-CM

## 2017-05-08 DIAGNOSIS — N185 Chronic kidney disease, stage 5: Secondary | ICD-10-CM | POA: Insufficient documentation

## 2017-05-08 DIAGNOSIS — F32A Depression, unspecified: Secondary | ICD-10-CM

## 2017-05-08 DIAGNOSIS — I959 Hypotension, unspecified: Secondary | ICD-10-CM

## 2017-05-08 DIAGNOSIS — D638 Anemia in other chronic diseases classified elsewhere: Secondary | ICD-10-CM

## 2017-05-08 DIAGNOSIS — G7281 Critical illness myopathy: Principal | ICD-10-CM

## 2017-05-08 DIAGNOSIS — I480 Paroxysmal atrial fibrillation: Secondary | ICD-10-CM | POA: Insufficient documentation

## 2017-05-08 DIAGNOSIS — F411 Generalized anxiety disorder: Secondary | ICD-10-CM

## 2017-05-08 DIAGNOSIS — R7303 Prediabetes: Secondary | ICD-10-CM

## 2017-05-08 LAB — GLUCOSE, CAPILLARY
GLUCOSE-CAPILLARY: 95 mg/dL (ref 65–99)
GLUCOSE-CAPILLARY: 90 mg/dL (ref 65–99)
GLUCOSE-CAPILLARY: 107 mg/dL — AB (ref 65–99)
Glucose-Capillary: 90 mg/dL (ref 65–99)

## 2017-05-08 MED ORDER — NEPRO/CARBSTEADY PO LIQD
237.0000 mL | Freq: Every day | ORAL | Status: DC
  Administered 2017-05-08: 237 mL via ORAL

## 2017-05-08 MED ORDER — SILVER SULFADIAZINE 1 % EX CREA
TOPICAL_CREAM | Freq: Every day | CUTANEOUS | Status: DC
  Administered 2017-05-08: 09:00:00 via TOPICAL
  Filled 2017-05-08: qty 85

## 2017-05-08 MED ORDER — SEVELAMER CARBONATE 800 MG PO TABS
1600.0000 mg | ORAL_TABLET | Freq: Three times a day (TID) | ORAL | Status: DC
  Administered 2017-05-08 – 2017-05-15 (×18): 1600 mg via ORAL
  Filled 2017-05-08 (×17): qty 2

## 2017-05-08 NOTE — Progress Notes (Signed)
Patient information reviewed and entered into eRehab system by Kirin Pastorino, RN, CRRN, PPS Coordinator.  Information including medical coding and functional independence measure will be reviewed and updated through discharge.     Per nursing patient was given "Data Collection Information Summary for Patients in Inpatient Rehabilitation Facilities with attached "Privacy Act Statement-Health Care Records" upon admission.  

## 2017-05-08 NOTE — Progress Notes (Signed)
Occupational Therapy Session Note  Patient Details  Name: [name] MRN: [id number] Date of Birth: 20-Sep-1951  Today's Date: 05/08/2017 OT Individual Time: 1305-1330 OT Individual Time Calculation (min): 25 min    Short Term Goals: Week 1:  OT Short Term Goal 1 (Week 1): Pt will complete sit > stand in [place] with one person assist to decrease burden of care OT Short Term Goal 2 (Week 1): Pt will complete slide board transfer with max assist of one caregiver OT Short Term Goal 3 (Week 1): Pt will complete don pants with max assist at bed level  Skilled Therapeutic Interventions/Progress Updates:  Treatment session with focus on anterior weight shift with sit > stands.  Pt in bed upon arrival, required mod assist for sequencing and positioning of BLE to come to sitting at EOB.  Engaged in sit > stand with +2 to come up to standing with use of Stedy.  Engaged in sit > stand x5 with mod-max assist for anterior weight shift in [place] from stedy seat.  Transferred to w/c and left upright with all needs in reach.  Therapy Documentation Precautions:  Precautions Precautions: Fall Restrictions Weight Bearing Restrictions: No General:   Vital Signs: Therapy Vitals Temp: 98.2 F (36.8 C) Temp Source: Oral Pulse Rate: (!) 115 Resp: 17 BP: 120/60 Patient Position (if appropriate): Sitting Oxygen Therapy SpO2: 99 % O2 Device: Not Delivered Pain:   Pt with no c/o pain  See Function Navigator for Current Functional Status.   Therapy/Group: Individual Therapy  [name] 05/08/2017, 3:03 PM

## 2017-05-08 NOTE — Plan of Care (Signed)
Problem: RH BOWEL ELIMINATION Goal: RH STG MANAGE BOWEL WITH ASSISTANCE STG Manage Bowel with min Assistance.  Outcome: Not Progressing Pt currently incontinent of bowel and constipated, had smear of stool on 7/30. Miralax administered in addition to scheduled stool softener.   Problem: RH SKIN INTEGRITY Goal: RH STG SKIN FREE OF INFECTION/BREAKDOWN Patients skin will remain free from further infection or breakdown with mod assist.  Outcome: Not Progressing WOC RN consult ordered, dressing changes to feet, MASD to bottom. Total assist at this point. Goal: RH STG MAINTAIN SKIN INTEGRITY WITH ASSISTANCE STG Maintain Skin Integrity With mod Assistance.  Outcome: Not Progressing Max assist with air bed and having to remind pt to reposition. Goal: RH STG ABLE TO PERFORM INCISION/WOUND CARE W/ASSISTANCE STG Able To Perform Incision/Wound Care With max Assistance.  Outcome: Not Progressing Pt/family to be taught, but total assist per RN for wound care. WOC RN to see pt on 7/31.

## 2017-05-08 NOTE — Consult Note (Addendum)
[facility] Nurse wound consult note Reason for Consult: Consult requested for BLE.  WOC consult was previously performed at another facility on 7/19; refer to progress notes.  Wounds have slowly improved since that time, according to pt. Wound type: Right leg was previously noted to have multiple blisters and patchy areas of full and partial thickness wounds.  There have all resolved and healed; there are a few patchy areas of dry scabs to right outer ankle; no further open wounds or drainage. Left anterior foot with 100% dry tightly adhered eschar; 13X6cm; no odor, drainage, or fluctuance.  Left calf with previous slough over a full thickness wound; this has evolved into 2 separate areas of slough with red granulation in between.  Total wound size is approx 5X5cm; 60% red, 40% yellow slough beginning to loosen and lift away from skin.  Slough areas are 4X3cm and 2X.5cm.  Small amt yellow drainage, no odor. Inner ankle with slough, 2X1cm, small amt yellow drainage, no odor. Dressing procedure/placement/frequency: Continue plan off care as previously ordered with Santyl for enzymatyic debridement of nonviable tissue to left foot and leg wounds.  Prevalon boots to reduce pressure when in bed. Discussed plan of care with patient and she verbalized understanding. Pt could benefit from follow-up at the outpatient wound care center after her rehab stay for further debridement of left foot after eschar has loosened; please order if desired when pt is ready to discharge. Please re-consult if further assistance is needed.  Thank-you,  Julien Girt MSN, [place], [place], [place], [place]

## 2017-05-08 NOTE — H&P (Signed)
Physical Medicine and Rehabilitation Admission H&P    Chief Complaint  Patient presents with  . Critical illness myopathy    HPI: [name] is an 66 y.o. female with history of HTN, A fib-on pradaxa who was admitted on [facility] on 04/08/17 due to BLE cellulitis with difficulty walking. She was found to be septic with leucocytosis and hypotensive requiring fluid bolus and pressors.  Per records, she has not had any meds X 3 months and had two week history of DOE, BLE edema with fatigue and weakness. She was started on broad spectrum antibiotics and was noted to be volume overloaded with right pleural effusion. CRRT initiated and  hospital course significant for PEA arrest 7/4, ARDS, anuria requiring  IJ placement by vascular surgery as now HD dependent, shocked liver with abnormal LFTs, thrombocytopenia, ongoing issues with hypotension and tachycardia.  She developed BUE edema and dopplers done revealing "occlusive deep vein thrombosis in the left cephalic vein extending from the mid humeral region down towards the elbow".   Pressors weaned off and ProAmatine added to help with BP support. She continues on amiodarone and Cardizem for HR control and Dr. [name] recommended coumadin or low dose Eliquis due to A fib/stroke prophylaxis. 2 D echo revealed EF 25-30% with mild concentric hypertrophy, moderate AVR, moderate TVR and mild MVR.  Serologic work up negative and limited hope for renal recovery per nephrology--IV albumin being used during HD treatment prn.  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 Therapy working on pre-gait activity and CIR recommended due to substantial deficits in mobility and ability to carry out ADL tasks.     Review of Systems  HENT: Negative for hearing loss and tinnitus.   Eyes: Negative for blurred vision.  Respiratory: Negative for cough and shortness of breath. Cardiovascular: Negative for chest pain and palpitations.    Gastrointestinal: Negative for abdominal pain, diarrhea, heartburn and nausea.  Genitourinary:       Not producing urine  Musculoskeletal: Negative for myalgias and neck pain.  Skin: Negative for rash.  Neurological: Positive for focal weakness and weakness. Negative for dizziness and sensory change.  Psychiatric/Behavioral: The patient is nervous/anxious and has insomnia.   All other systems reviewed and negative.      Past Medical History:  Diagnosis Date  . A-fib ([place])    on pradaxa  . Hypertension   . Thyroid disease     Past Surgical History:  Procedure Laterality Date  . DIALYSIS/PERMA CATHETER INSERTION N/A 04/26/2017   Procedure: Dialysis/Perma Catheter Insertion;  Surgeon: Algernon Huxley, MD;  Location: [facility];  Service: Cardiovascular;  Laterality: N/A;  . WRIST ARTHROSCOPY      Family History  Problem Relation Age of Onset  . AAA (abdominal aortic aneurysm) Mother     Social History:  Married. Husband retired and in reasonable health. Retired from [employer] in [employer].  Independent  PTA without AD. She reports that she has never smoked. She has never used smokeless tobacco. She reports that she does not drink alcohol. She does not use illicit drugs.    Allergies: No Known Allergies    Medications Prior to Admission  Medication Sig Dispense Refill  . ALPRAZolam (XANAX) 0.25 MG tablet Take 0.25 mg by mouth at bedtime as needed for anxiety.    . dabigatran (PRADAXA) 150 MG CAPS capsule Take 150 mg by mouth 2 (two) times daily.    . furosemide (LASIX) 20  MG tablet Take 20 mg by mouth.    . levothyroxine (SYNTHROID, LEVOTHROID) 50 MCG tablet Take 50 mcg by mouth daily before breakfast.    . metoprolol tartrate (LOPRESSOR) 50 MG tablet Take 50 mg by mouth 2 (two) times daily.    . sertraline (ZOLOFT) 25 MG tablet Take 25 mg by mouth daily.    . [DISCONTINUED] diltiazem (TIAZAC) 180 MG 24 hr capsule Take 180 mg by mouth 2 (two) times daily.       Home: Home Living Family/patient expects to be discharged to:: Other (Comment) (LTACH) Living Arrangements: Spouse/significant other Available Help at Discharge: Family, Available 24 hours/day Type of Home: House Home Access: Stairs to enter [employer] of Steps: 4 Entrance Stairs-Rails: Right Home Layout: One level Bathroom Shower/Tub: Multimedia programmer: Standard Bathroom Accessibility: Yes Home Equipment: None Additional Comments: N/A  Lives With: Spouse   Functional History: Prior Function Level of Independence: Independent Comments: Indep with ADLs, household and community mobility; + driving; denies fall history.  Functional Status:  Mobility: Bed Mobility Overal bed mobility: Needs Assistance Bed Mobility: Supine to Sit, Sit to Supine Supine to sit: Mod assist, Max assist Sit to supine: Max assist, Total assist General bed mobility comments: mod-max assist for BLE and trunk mgt for sup>sit EOB with VC for hand placement and use of bed rails to assist with sitting upright. Max assist to total assist x1 with bed in trendelenburg position for sit>sup due to fatigue and weakness. Transfers Overall transfer level: Needs assistance Equipment used: Rolling walker (2 wheeled) Transfers: Sit to/from Stand Sit to Stand: Max assist, +2 physical assistance  Lateral/Scoot Transfers: Max assist, From elevated surface, +2 physical assistance General transfer comment: deferred due to fatigue/safety Ambulation/Gait General Gait Details: unsafe/unable    ADL: ADL Overall ADL's : Needs assistance/impaired Eating/Feeding: Bed level, Set up Eating/Feeding Details (indicate cue type and reason): pt unable to open some containers (OJ), increased effort/time to open small butter container and grits lid, required set up of tray to optimize access with BUE supported by pillows to improve shoulder flexion in order for pt to bring food to mouth, gross grasp used  for scooping and bringing spoon to mouth but no spillage Grooming: Sitting, Wash/dry hands, Wash/dry face, Oral care Grooming Details (indicate cue type and reason): Pt tolerated sitting EOB for 25 minutes today with supervision and set up to wash hands/face and brush teeth. Pt demonstrating continued improvement in overall strength and activity tolerance for self care tasks.  Upper Body Bathing: Maximal assistance, Bed level Lower Body Bathing: Maximal assistance, Bed level Upper Body Dressing : Maximal assistance, Bed level Lower Body Dressing: Maximal assistance, Bed level Toilet Transfer Details (indicate cue type and reason): unable at this time Functional mobility during ADLs:  (unsafe to ambulate at this time) General ADL Comments: pt continues to require max assist for all bathing, dressing, and toileting tasks due to profound weakness; reeducated pt in BUE ROM/strengthening exercises pt can perform in bed throughout the day to support strengthening and activity tolerance  Cognition: Cognition Overall Cognitive Status: Within Functional Limits for tasks assessed Orientation Level: Oriented to person, Oriented to place, Oriented to time, Oriented to situation Cognition Arousal/Alertness: Awake/alert Behavior During Therapy: [facility] for tasks assessed/performed Overall Cognitive Status: Within Functional Limits for tasks assessed General Comments: Pt in pleasant mood, happy to see therapist, pleased with her progress thus far and looking forward to going to CIR on Monday   Blood pressure 110/75, pulse (!) 109,  temperature 97.8 F (36.6 C), temperature source Oral, resp. rate 20, height '5\' 5"'  (1.651 m), weight 75.3 kg (166 lb), SpO2 99 %. Physical Exam  Nursing note and vitals reviewed. Constitutional: She is oriented to person, place, and time. She appears well-developed and well-nourished.  Pleasant and appropriate. Speech clear and strong.   HENT:  Head: Normocephalic.   Mouth/Throat: Oropharynx is clear and moist.  Eyes: Pupils are equal, round, and reactive to light.  Neck: Normal range of motion. Neck supple.  Right IJ noted.   Cardiovascular: Normal rate.  An irregularly irregular rhythm present.  Murmur heard. Respiratory: Effort normal. No stridor. No respiratory distress. She has decreased breath sounds in the right lower field and the left lower field. She has no wheezes.  GI: Soft. Bowel sounds are normal. She exhibits no distension. There is no tenderness.  Musculoskeletal: She exhibits tenderness.  Neurological: She is alert and oriented to person, place, and time.  Able to follow commands without difficulty.  Motor: B/l UE 4/5 proximal to distal B/l LE; HF 2/5, KE 2+/5, wiggles toes Sensation intact to light touch throughout Skin: Skin is warm and dry.  Dry ace wrapped dressing on bilateral feet.   Psychiatric: She has a normal mood and affect. Her behavior is normal. Thought content normal.    Results for orders placed or performed during the hospital encounter of 04/08/17 (from the past 48 hour(s))  Glucose, capillary     Status: None   Collection Time: 05/05/17  4:16 PM  Result Value Ref Range   Glucose-Capillary 83 65 - 99 mg/dL   Comment 1 Notify RN   Glucose, capillary     Status: None   Collection Time: 05/05/17  9:26 PM  Result Value Ref Range   Glucose-Capillary 84 65 - 99 mg/dL  Glucose, capillary     Status: None   Collection Time: 05/06/17  8:18 AM  Result Value Ref Range   Glucose-Capillary 71 65 - 99 mg/dL   Comment 1 Notify RN   Glucose, capillary     Status: None   Collection Time: 05/06/17 12:09 PM  Result Value Ref Range   Glucose-Capillary 94 65 - 99 mg/dL   Comment 1 Notify RN   Glucose, capillary     Status: None   Collection Time: 05/06/17  5:30 PM  Result Value Ref Range   Glucose-Capillary 96 65 - 99 mg/dL   Comment 1 Notify RN   Glucose, capillary     Status: None   Collection Time: 05/06/17  9:57 PM   Result Value Ref Range   Glucose-Capillary 80 65 - 99 mg/dL  Glucose, capillary     Status: None   Collection Time: 05/07/17  7:48 AM  Result Value Ref Range   Glucose-Capillary 78 65 - 99 mg/dL   Comment 1 Notify RN   Renal function panel     Status: Abnormal   Collection Time: 05/07/17 11:27 AM  Result Value Ref Range   Sodium 140 135 - 145 mmol/L   Potassium 4.8 3.5 - 5.1 mmol/L   Chloride 100 (L) 101 - 111 mmol/L   CO2 27 22 - 32 mmol/L   Glucose, Bld 80 65 - 99 mg/dL   BUN 81 (H) 6 - 20 mg/dL   Creatinine, Ser 4.47 (H) 0.44 - 1.00 mg/dL   Calcium 9.0 8.9 - 10.3 mg/dL   Phosphorus 6.9 (H) 2.5 - 4.6 mg/dL   Albumin 3.5 3.5 - 5.0 g/dL   GFR calc  non Af Amer 9 (L) >60 mL/min   GFR calc Af Amer 11 (L) >60 mL/min    Comment: (NOTE) The eGFR has been calculated using the CKD EPI equation. This calculation has not been validated in all clinical situations. eGFR's persistently <60 mL/min signify possible Chronic Kidney Disease.    Anion gap 13 5 - 15  CBC     Status: Abnormal   Collection Time: 05/07/17 11:27 AM  Result Value Ref Range   WBC 13.5 (H) 3.6 - 11.0 K/uL   RBC 2.79 (L) 3.80 - 5.20 MIL/uL   Hemoglobin 9.1 (L) 12.0 - 16.0 g/dL   HCT 27.2 (L) 35.0 - 47.0 %   MCV 97.5 80.0 - 100.0 fL   MCH 32.5 26.0 - 34.0 pg   MCHC 33.3 32.0 - 36.0 g/dL   RDW 22.4 (H) 11.5 - 14.5 %   Platelets 288 150 - 440 K/uL  Glucose, capillary     Status: None   Collection Time: 05/07/17 12:00 PM  Result Value Ref Range   Glucose-Capillary 82 65 - 99 mg/dL   Comment 1 Notify RN    No results found.  Medical Problem List and Plan: 1.  Deficits in mobility and ability to carry out ADL tasks secondary to CIM 2.  DVT Prophylaxis/Anticoagulation: Pharmaceutical: Other (comment)--Eliquis 3. Pain Management: tylenol prn 4. Mood: LCSW to follow for evaluation and support.  5. Neuropsych: This patient is capable of making decisions on her own behalf. 6. Skin/Wound Care: routine pressure  relief measures. Prevalon boot for heel ulcer.  7. Fluids/Electrolytes/Nutrition: Strict I/O. Monitor daily weights.   8.  Hypotension: Monitor orthostatic BP. Will add abdominal binder and TEDs to help with BP support.  9. Afib: Monitor HR bid--on eliquis for stroke prevention. Continue amiodarone bid and Cardizem daily.  10. Leucocytosis:   Patient with heel ulcer--will get WOC to evaluate.   Afebrile  WBCs 13.5 on 7/30  Cont to monitor 11. Anemia of chronic disease: On epogen  MWF.   Hb 9.1 on 7/30  Cont to montior 12. Left foot ulcer: Daily dressing changes with santyl. Monitor for signs of infection.  13. Anxiety/depression: ON Seroquel and Zoloft at nights.  14. Prediabetes: Will consult dietitian to educate patein ton diet.  Hgb A1c- 6.2. Monitor BS ac/hs. 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 Renal diet--Reducate patient on appropriate diet.    Post Admission Physician Evaluation: 1. Preadmission assessment reviewed and changes made below. 2. 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 3. Patient is admitted to receive collaborative, interdisciplinary care between the physiatrist, rehab nursing staff, and therapy team. 4. Patient's level of medical complexity and substantial therapy needs in context of that medical necessity cannot be provided at a lesser intensity of care such as a SNF. 5. Patient has experienced substantial functional loss from his/her baseline which was documented above under the "Functional History" and "Functional Status" headings.  Judging by the patient's diagnosis, physical exam, and functional history, the patient has potential for functional progress which will result in measurable gains while on inpatient rehab.  These gains will be of substantial and practical use upon discharge  in facilitating mobility and self-care at the household level. 6. Physiatrist  will provide 24 hour management of medical needs as well as oversight of the therapy plan/treatment and provide guidance as appropriate regarding the interaction of the two. 7.  24 hour rehab nursing will assist with bladder management, safety, skin/wound care, disease management and patient education  and help integrate therapy concepts, techniques,education, etc. 8. PT will assess and treat for/with: Lower extremity strength, range of motion, stamina, balance, functional mobility, safety, adaptive techniques and equipment, woundcare, coping skills, pain control, education.   Goals are: Min/Mod A. 9. OT will assess and treat for/with: ADL's, functional mobility, safety, upper extremity strength, adaptive techniques and equipment, wound mgt, ego support, and community reintegration.   Goals are: Min/Mod A. Therapy may not proceed with showering this patient. 10. Case Management and Social Worker will assess and treat for psychological issues and discharge planning. 11. Team conference will be held weekly to assess progress toward goals and to determine barriers to discharge. 12. Patient will receive at least 3 hours of therapy per day at least 5 days per week. 13. ELOS: 18-23 days.       14. Prognosis:  good  Delice Lesch, MD, Mellody Drown 05/08/17 Bary Leriche, PA-C 05/07/2017

## 2017-05-08 NOTE — IPOC Note (Signed)
Overall Plan of Care [facility]) Patient Details Name: [name] MRN: [id number] DOB: [birth year]  Admitting Diagnosis: Critical Illness  Hospital Problems: Active Problems:   Critical illness myopathy   Skin ulcer of right heel (HCC)   PAF (paroxysmal atrial fibrillation) (HCC)   Hypotension   Leukocytosis   Anemia of chronic disease   Anxiety state   Depression   Prediabetes   Stage 5 chronic kidney disease not on chronic dialysis [facility])     Functional Problem List: Nursing Bowel, Edema, Endurance, Medication Management, Motor, Skin Integrity, Nutrition  PT Balance, Endurance, Motor  OT Balance, Endurance, Motor, Pain, Safety, Skin Integrity  SLP    TR         Basic ADL's: OT Eating, Grooming, Bathing, Dressing, Toileting     Advanced  ADL's: OT Simple Meal Preparation     Transfers: PT Bed Mobility, Bed to Chair, Car, Manufacturing systems engineer, Metallurgist: PT Ambulation, Stairs, Emergency planning/management officer     Additional Impairments: OT Fuctional Use of Upper Extremity  SLP        TR      Anticipated Outcomes Item Anticipated Outcome  Self Feeding Supervision/setup  Swallowing      Basic self-care  Min assist  Toileting  Min assist   Bathroom Transfers Min assist  Bowel/Bladder  Min assist  Transfers  min A  Locomotion  supervision w/c level  Communication     Cognition     Pain  < 3  Safety/Judgment  Mod I   Therapy Plan: PT Intensity: Minimum of 1-2 x/day ,45 to 90 minutes PT Frequency: 5 out of 7 days PT Duration Estimated Length of Stay: 21-24 days OT Intensity: Minimum of 1-2 x/day, 45 to 90 minutes OT Frequency: 5 out of 7 days OT Duration/Estimated Length of Stay: 21-24 days      Team Interventions: Nursing Interventions Patient/Family Education, Bowel Management, Medication Management, Disease Management/Prevention, Skin Care/Wound Management  PT interventions Ambulation/gait training, Discharge planning, DME/adaptive  equipment instruction, Functional mobility training, Splinting/orthotics, Therapeutic Activities, UE/LE Strength taining/ROM, Pain management, Wheelchair propulsion/positioning, UE/LE Coordination activities, Therapeutic Exercise, Stair training, Patient/family education, Neuromuscular re-education, Functional electrical stimulation, Community reintegration, Training and development officer  OT Interventions Training and development officer, Academic librarian, Discharge planning, Disease mangement/prevention, Engineer, drilling, Functional mobility training, Neuromuscular re-education, Pain management, Patient/family education, Psychosocial support, Self Care/advanced ADL retraining, Skin care/wound managment, Therapeutic Activities, Therapeutic Exercise, UE/LE Strength taining/ROM, UE/LE Coordination activities, Wheelchair propulsion/positioning  SLP Interventions    TR Interventions    SW/CM Interventions Discharge Planning, Psychosocial Support, Patient/Family Education   Barriers to Discharge MD  Medical stability and Lack of/limited family support  Nursing Medication compliance, Hemodialysis    PT Inaccessible home environment, Wound Care, Hemodialysis 4 stairs to enter home, needs to get to/from dialysis at d/c  OT Hemodialysis    SLP      SW       Team Discharge Planning: Destination: PT-Home ,OT- Home , SLP-  Projected Follow-up: PT-Home health PT, 24 hour supervision/assistance, OT-  Home health OT, 24 hour supervision/assistance, SLP-  Projected Equipment Needs: PT-To be determined, OT- 3 in 1 bedside comode, Tub/shower bench, SLP-  Equipment Details: PT- , OT-  Patient/family involved in discharge planning: PT- Patient,  OT-Patient, SLP-   MD ELOS: 19-22 days. Medical Rehab Prognosis:  Good Assessment:  [name] is an 66 y.o. female with history of HTN, A fib-on pradaxa who was admitted on [facility] on 04/08/17 due to BLE cellulitis  with difficulty walking. She was  found to be septic with leucocytosis and hypotensive requiring fluid bolus and pressors.  Per records, she has not had any meds X 3 months and had two week history of DOE, BLE edema with fatigue and weakness. She was started on broad spectrum antibiotics and was noted to be volume overloaded with right pleural effusion. CRRT initiated and  hospital course significant for PEA arrest 7/4, ARDS, anuria requiring  IJ placement by vascular surgery as now HD dependent, shocked liver with abnormal LFTs, thrombocytopenia, ongoing issues with hypotension and tachycardia.  She developed BUE edema and dopplers done revealing "occlusive deep vein thrombosis in the left cephalic vein extending from the mid humeral region down towards the elbow".   Pressors weaned off and ProAmatine added to help with BP support. She continues on amiodarone and Cardizem for HR control and Dr. [name] recommended coumadin or low dose Eliquis due to A fib/stroke prophylaxis. 2 D echo revealed EF 25-30% with mild concentric hypertrophy, moderate AVR, moderate TVR and mild MVR.  Serologic work up negative and limited hope for renal recovery per nephrology--IV albumin being used during HD treatment prn.  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 Therapy working on pre-gait activity and CIR recommended due to substantial deficits in mobility and ability to carry out ADL tasks.  Will set goals for Min A with PT/OT.   See Team Conference Notes for weekly updates to the plan of care

## 2017-05-08 NOTE — Consult Note (Signed)
[name] Admit Date: 05/07/2017 05/08/2017 [name] Requesting Physician:  Dr. Posey Pronto   Reason for Consult:  New ESRD on HD  HPI:   Patient is a 66 yo F with a pmhx of HTN, hypothyroidism, CKD IIIa at baseline (Scr 1.1 in 03/2017 with GFR 50), A fib on pradaxa  admitted to [facility] on 04/08/17 due to bilateral lower extremity cellulitis.  Per report, patient had been off all her meds for 3 months prior to admission and presented with 2 weeks of progressive DOE, bilateral lower extremity edema, fatigue, and weakness.  On admission she was found to be septic with leukocytosis and hypotension requiring fluid resuscitation and pressors. She was started on broad spectrum antibiotics (vanc/zosyn). She subsequently developed worsening renal failure and anuria and was started on CRRT on 7/2. She was weaned off CRRT on 7/4 but later that same day went into cardiac arrest. Subsequently intubated, sedated, and pressors were restarted. CRRT was resumed the same day. Hospitalization was further complicated by shock liver with transaminiits, on-going hypotension, atrial fibrillation with RVR, and upper extremity occlusive DVTs in the left cephalic vein extending from the mid humeral region down towards the elbow. Pressors were eventually weaned and midodrine was started for BP support. Serologic work up for ESRD was non contributory (SPEP/UPEP, ANA, ANCA, GBM Ab, and hepatitis labs all negative). She is now HD dependent and being dialyzed via a RIJ permcath- no UOP. Outpatient dialysis has been arranged for [place] with Dr. Holley Raring.  Patient was admitted to inpatient rehab 05/08/17 due diffuse weakness, deconditioning, and critital illness myopathy. Nephrology consulted for further HD needs.   Of note, patient also received contrast on 04/04/2017 with CT abdomen/Pelvis prior to hospitalization.   PMH Incudes:  HTN  CKD, now ESRD   Atrial fibrillation  HFrEF (25-30%)  Hypothyroid    Creatinine  (mg/dL)  Date Value  09/19/2014 0.62  09/18/2014 0.98   Creatinine, Ser (mg/dL)  Date Value  05/07/2017 4.47 (H)  05/04/2017 2.86 (H)  05/02/2017 3.08 (H)  05/01/2017 2.09 (H)  04/30/2017 3.01 (H)  04/28/2017 1.41 (H)  04/27/2017 1.96 (H)  04/26/2017 1.05 (H)  04/25/2017 0.55  04/25/2017 1.03 (H)  ] I/Os:  ROS NSAIDS: None IV Contrast Yes, 04/04/17 with CT ab/pelvis  TMP/SMX None Hypotension Yes, on pressors recent hospitalization  Balance of 12 systems is negative w/ exceptions as above  PMH  Past Medical History:  Diagnosis Date  . A-fib ([place])    on pradaxa  . Chronic kidney disease   . Dysrhythmia   . Hypertension   . Hypothyroidism   . Thyroid disease    PSH  Past Surgical History:  Procedure Laterality Date  . DIALYSIS/PERMA CATHETER INSERTION N/A 04/26/2017   Procedure: Dialysis/Perma Catheter Insertion;  Surgeon: Algernon Huxley, MD;  Location: [facility];  Service: Cardiovascular;  Laterality: N/A;  . WRIST ARTHROSCOPY     FH  Family History  Problem Relation Age of Onset  . AAA (abdominal aortic aneurysm) Mother    SH  reports that she has never smoked. She has never used smokeless tobacco. She reports that she does not drink alcohol or use drugs. Allergies No Known Allergies Home medications Prior to Admission medications   Medication Sig Start Date End Date Taking? Authorizing Provider  ALPRAZolam Duanne Moron) 0.25 MG tablet Take 0.25 mg by mouth at bedtime as needed for anxiety.    [provider]  amiodarone (PACERONE) 200 MG tablet Take 1 tablet (200  mg total) by mouth 2 (two) times daily. 05/07/17   Hillary Bow, MD  apixaban (ELIQUIS) 2.5 MG TABS tablet Take 1 tablet (2.5 mg total) by mouth 2 (two) times daily. 05/07/17   Hillary Bow, MD  diltiazem (TIAZAC) 180 MG 24 hr capsule Take 1 capsule (180 mg total) by mouth daily. 05/07/17   Hillary Bow, MD  famotidine (PEPCID) 20 MG tablet Take 1 tablet (20 mg total) by mouth at  bedtime. 05/07/17   Hillary Bow, MD  levothyroxine (SYNTHROID, LEVOTHROID) 50 MCG tablet Take 50 mcg by mouth daily before breakfast.    [provider]  midodrine (PROAMATINE) 5 MG tablet Take 1 tablet (5 mg total) by mouth 3 (three) times daily with meals. 05/07/17   Hillary Bow, MD  sertraline (ZOLOFT) 25 MG tablet Take 25 mg by mouth daily.    [provider]    Current Medications Scheduled Meds: . amiodarone  200 mg Oral BID  . apixaban  2.5 mg Oral BID  . collagenase   Topical Daily  . [START ON 05/09/2017] darbepoetin (ARANESP) injection - DIALYSIS  60 mcg Intravenous Q Wed-HD  . diltiazem  120 mg Oral Daily  . famotidine  20 mg Oral QHS  . feeding supplement (PRO-STAT SUGAR FREE 64)  30 mL Oral BID  . insulin aspart  0-15 Units Subcutaneous TID WC  . insulin aspart  0-5 Units Subcutaneous QHS  . levothyroxine  100 mcg Oral QAC breakfast  . midodrine  5 mg Oral TID WC  . multivitamin  1 tablet Oral QHS  . pneumococcal 23 valent vaccine  0.5 mL Intramuscular Tomorrow-1000  . QUEtiapine  25 mg Oral QHS  . sertraline  25 mg Oral QHS   Continuous Infusions: . [START ON 05/09/2017] albumin human     PRN Meds:.acetaminophen, aluminum hydroxide, artificial tears, bisacodyl, diphenhydrAMINE, guaiFENesin-dextromethorphan, ipratropium-albuterol, ondansetron (ZOFRAN) IV, phenol, polyethylene glycol, prochlorperazine **OR** prochlorperazine **OR** prochlorperazine, zinc oxide  CBC  Recent Labs Lab 05/02/17 0900 05/04/17 1029 05/07/17 1127  WBC 10.7 7.8 13.5*  HGB 8.9* 8.6* 9.1*  HCT 26.8* 26.0* 27.2*  MCV 97.8 98.2 97.5  PLT 140* 184 063   Basic Metabolic Panel  Recent Labs Lab 05/02/17 0900 05/04/17 1029 05/07/17 1127  NA 140 140 140  K 4.6 3.8 4.8  CL 98* 99* 100*  CO2 28 28 27   GLUCOSE 74 154* 80  BUN 72* 55* 81*  CREATININE 3.08* 2.86* 4.47*  CALCIUM 8.7* 8.6* 9.0  PHOS 6.9* 4.6 6.9*    Physical Exam  Blood pressure (!) 125/93, pulse (!)  103, temperature 98 F (36.7 C), temperature source Oral, resp. rate 16, height 5\' 5"  (1.651 m), weight 171 lb (77.6 kg), SpO2 96 %. GEN: Alert and oriented CV: RRR, diastolic murmur PULM: CTAB ABD: Soft, nontender  SKIN: Now rash EXT: +1 lower extremity edema, lower extremities cool to touch    Assessment Ms. Hegwood is a 65yo female recently admitted to [facility] with septic and cardiogenic shock with multiorgan failure including anuric AKI for which she was started on CRRT and transitioned to intermittent HD (MWF). Hospital course complicated by PEA arrest, UE DVT; she received vanc/zosyn and had contrast exposure. She is now in physical rehabilitation following her month long hospitalization and  1. ESRD - previous renal function at CKD stage 3; she had an anuric AKI in setting of septic and cardiogenic shock requiring pressors; serologies have been negative. Patient also had exposure to IV contrast 3 days prior to  admission and received IV vanc/zosyn during admission. She had a RIJ permacath placed for HD access and no plans as of yet for fistula placement. She remains anuric but due to overall stable renal function prior to admission, there is hope for improvement and getting patient off of dialysis. Her schedule here is MWF, a OP has been set up for TTS. She is on midodrine for pressure support. 2. Anemia - no iron studies done; on aranesp qwed now 3. Hyperphosphatemia - not on phosphate binders- will start 4. A fib - on diltiazem, amiodarone; eliquis 5. Hypothyroidism - on levothyroxine  Plan 1. Will plan on continuing HD schedule MWF but with eventual plan to change to TTS for outpatient purposes. We'll try to get fistula and toward the end of her hospitalization if this still dialysis dependent. Continue to watch for any urine output or signs of renal recovery which would not be outside the realm of possibility 2. Aranesp every Wednesday with HD sessions 3. Renal panel in AM 4. F/u  iron studies, PTH- start Renvela 5. Daily weights, strict ins/outs, fluid restriction 1279ml 6. If no improvement in renal function, will need to have permanent access established in the next few weeks.   [name], M.D. - PGY2 Pager: [phone] 05/08/2017, 12:16 PM   Patient seen and examined, agree with above note with above modifications. Unfortunately 66 year old female with previous relatively normal kidney function. She is status post a hospitalization for sepsis and AK I requiring dialysis. She's been dialysis dependent for one month. She is not making any urine. We'll continue with supportive dialysis as above but watch for signs of renal recovery. Manage the anemia and secondary hyperparathyroidism of her CKD Corliss Parish, MD 05/08/2017

## 2017-05-08 NOTE — Progress Notes (Signed)
Initial Nutrition Assessment  DOCUMENTATION CODES:   Not applicable  INTERVENTION:  Continue 30 ml Prostat po BID, each supplement provides 100 kcal and 15 grams of protein.   Provide Nepro Shake po once daily, each supplement provides 425 kcal and 19 grams protein  Encourage adequate PO intake.   Diet education regarding renal diet was given and discussed.   NUTRITION DIAGNOSIS:   Increased nutrient needs related to chronic illness as evidenced by estimated needs.  GOAL:   Patient will meet greater than or equal to 90% of their needs  MONITOR:   PO intake, Supplement acceptance, Labs, Weight trends, Skin, I & O's  REASON FOR ASSESSMENT:   Consult Diet education  ASSESSMENT:   66 y.o. female with history of HTN, A fib-on pradaxa who was admitted on [facility] on 04/08/17 due to BLE cellulitis with difficulty walking. She was found to be septic with leucocytosis and hypotensive. CRRT initiated and  hospital course significant for PEA arrest 7/4, ARDS, anuria requiring  IJ placement by vascular surgery as now HD dependent, shocked liver with abnormal LFTs, thrombocytopenia, ongoing issues with hypotension and tachycardia. 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  Meal completion has been 10-50%. Pt reports appetite has been improving. Pt currently has Prostat ordered and has been consuming them. RD to additionally order Nepro Shake to aid in nutrition needs. Pt agreeable.  RD additionally consulted for diet education. Pt was given handout "Eating Healthy with Kidney Disease". Explained why diet restrictions are needed and provided lists of foods to limit/avoid that are high potassium, sodium, and phosphorus. Provided specific recommendations on safer alternatives of these foods. Discussed renal friendly drink options.   Unable to complete Nutrition-Focused physical exam at this time. RD to perform physical exam at next visit.    Labs  and medications reviewed. Phosphorous elevated at 6.9.  Diet Order:  Diet renal with fluid restriction Fluid restriction: 1200 mL Fluid; Room service appropriate? Yes; Fluid consistency: Thin  Skin:   (Non pressure wound on buttocks)  Last BM:  7/29  Height:   Ht Readings from Last 1 Encounters:  05/07/17 5\' 5"  (1.651 m)    Weight:   Wt Readings from Last 1 Encounters:  05/08/17 171 lb (77.6 kg)    Ideal Body Weight:  56.8 kg  BMI:  Body mass index is 28.46 kg/m.  Estimated Nutritional Needs:   Kcal:  1800-2000  Protein:  90-110 grams  Fluid:  1.2 L/day  EDUCATION NEEDS:   Education needs addressed  Corrin Parker, MS, RD, LDN Pager # [phone] After hours/ weekend pager # [phone]

## 2017-05-08 NOTE — Evaluation (Signed)
Occupational Therapy Assessment and Plan  Patient Details  Name: [name] MRN: [id number] Date of Birth: 1951/06/23  OT Diagnosis: muscular wasting and disuse atrophy, muscle weakness (generalized) and swelling of limb Rehab Potential: Rehab Potential (ACUTE ONLY): Good ELOS: 21-24 days   Today's Date: 05/08/2017 OT Individual Time: 1030-1130 OT Individual Time Calculation (min): 60 min     Problem List:  Patient Active Problem List   Diagnosis Date Noted  . Skin ulcer of right heel ([place])   . PAF (paroxysmal atrial fibrillation) ([place])   . Hypotension   . Leukocytosis   . Anemia of chronic disease   . Anxiety state   . Depression   . Prediabetes   . 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 Pressure injury of skin 04/19/2017  . Acute renal failure (ARF) ([place])   . Cellulitis of lower extremity   . Edema extremities   . Acute respiratory failure ([place])   . Acute renal failure ([place])   . Multi-organ failure with heart failure ([place])   . Palliative care by specialist   . Goals of care, counseling/discussion   . Septic shock ([place]) 04/08/2017    Past Medical History:  Past Medical History:  Diagnosis Date  . A-fib ([place])    on pradaxa  . Chronic kidney disease   . Dysrhythmia   . Hypertension   . Hypothyroidism   . Thyroid disease    Past Surgical History:  Past Surgical History:  Procedure Laterality Date  . DIALYSIS/PERMA CATHETER INSERTION N/A 04/26/2017   Procedure: Dialysis/Perma Catheter Insertion;  Surgeon: Algernon Huxley, MD;  Location: [facility];  Service: Cardiovascular;  Laterality: N/A;  . WRIST ARTHROSCOPY      Assessment & Plan Clinical Impression: Patient is a 66 y.o. year old female with admission to [facility] on 04/08/17 due to BLE cellulitis with difficulty walking. She was found to be septic with leucocytosis and hypotensive requiring fluid bolus and pressors.  Per records, she has not  had any meds X 3 months and had two week history of DOE, BLE edema with fatigue and weakness. She was started on broad spectrum antibiotics and was noted to be volume overloaded with right pleural effusion. CRRT initiated and  hospital course significant for PEA arrest 7/4, ARDS, anuria requiring  IJ placement by vascular surgery as now HD dependent, shocked liver with abnormal LFTs, thrombocytopenia, ongoing issues with hypotension and tachycardia.  She developed BUE edema and dopplers done revealing "occlusive deep vein thrombosis in the left cephalic vein extending from the mid humeral region down towards the elbow".   Pressors weaned off and ProAmatine added to help with BP support. She continues on amiodarone and Cardizem for HR control and Dr. [name] recommended coumadin or low dose Eliquis due to A fib/stroke prophylaxis. 2 D echo revealed EF 25-30% with mild concentric hypertrophy, moderate AVR, moderate TVR and mild MVR.  Serologic work up negative and limited hope for renal recovery per nephrology--IV albumin being used during HD treatment prn.  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   Patient transferred to CIR on 05/07/2017 .    Patient currently requires total with basic self-care skills secondary to muscle weakness, decreased cardiorespiratoy endurance and decreased standing balance and decreased balance strategies.  Prior to hospitalization, patient could complete ADLs and IADLs with independent .  Patient will benefit from skilled intervention to  decrease level of assist with basic self-care skills prior to discharge home with care partner.  Anticipate patient will require 24 hour supervision and minimal physical assistance and follow up home health.  OT - End of Session Activity Tolerance: Tolerates 30+ min activity with multiple rests Endurance Deficit: Yes Endurance Deficit Description: required frequent rest breaks, returned to bed at  end of session OT Assessment Rehab Potential (ACUTE ONLY): Good OT Barriers to Discharge: Hemodialysis OT Patient demonstrates impairments in the following area(s): Balance;Endurance;Motor;Pain;Safety;Skin Integrity OT Basic ADL's Functional Problem(s): Eating;Grooming;Bathing;Dressing;Toileting OT Advanced ADL's Functional Problem(s): Simple Meal Preparation OT Transfers Functional Problem(s): Toilet;Tub/Shower OT Additional Impairment(s): Fuctional Use of Upper Extremity OT Plan OT Intensity: Minimum of 1-2 x/day, 45 to 90 minutes OT Frequency: 5 out of 7 days OT Duration/Estimated Length of Stay: 21-24 days OT Treatment/Interventions: Medical illustrator training;Community reintegration;Discharge planning;Disease mangement/prevention;DME/adaptive equipment instruction;Functional mobility training;Neuromuscular re-education;Pain management;Patient/family education;Psychosocial support;Self Care/advanced ADL retraining;Skin care/wound managment;Therapeutic Activities;Therapeutic Exercise;UE/LE Strength taining/ROM;UE/LE Coordination activities;Wheelchair propulsion/positioning OT Self Feeding Anticipated Outcome(s): Supervision/setup OT Basic Self-Care Anticipated Outcome(s): Min assist OT Toileting Anticipated Outcome(s): Min assist OT Bathroom Transfers Anticipated Outcome(s): Min assist OT Recommendation Recommendations for Other Services: Therapeutic Recreation consult Therapeutic Recreation Interventions: Other (comment) Patient destination: Home Follow Up Recommendations: Home health OT;24 hour supervision/assistance Equipment Recommended: 3 in 1 bedside comode;Tub/shower bench   Skilled Therapeutic Intervention OT eval completed with discussion regarding rehab process, OT purpose, POC, ELOS, and goals.  ADL assessment completed with UB bathing seated at sink.  Pt required min assist with anterior weight shift in sitting to obtain items from sink and assist to thread hospital gown  after bathing due to limited shoulder AROM.  Engaged in sit > stand with therapist in front of pt providing stability at knees and 2nd person behind to assist with lifting, able to come to partial standing with pt unable to lift trunk due to fearfulness, fatigue, and BLE weakness.  [employer] with pt able to achieve upright with +2 and pulling up on [place] bar.  Engaged in sit > stand x2 from [place] and then returned to bed.   OT Evaluation Precautions/Restrictions  Precautions Precautions: Fall Restrictions Weight Bearing Restrictions: No Pain Pain Assessment Pain Assessment: No/denies pain Home Living/Prior Functioning Home Living Family/patient expects to be discharged to:: Private residence Living Arrangements: Spouse/significant other Available Help at Discharge: Family, Available 24 hours/day Type of Home: House Home Access: Stairs to enter [employer] of Steps: 4 Entrance Stairs-Rails: Right, Left, Can reach both Home Layout: One level Bathroom Shower/Tub: Tub/shower unit, Walk-in shower (has both) Biochemist, clinical: Programmer, systems: Yes  Lives With: Spouse IADL History Homemaking Responsibilities: Yes Meal Prep Responsibility: Primary Laundry Responsibility: Primary Cleaning Responsibility: Primary Shopping Responsibility: Primary Current License: Yes Occupation: Retired Prior Function Level of Independence: Independent with gait, Independent with transfers, Independent with basic ADLs, Independent with homemaking with ambulation  Able to Take Stairs?: Yes Driving: Yes Vocation: Retired Comments: Independent with ADLs, enjoys household tasks ADL  See Function Navigator Vision Baseline Vision/History: Wears glasses Wears Glasses: At all times Patient Visual Report: No change from baseline Vision Assessment?: No apparent visual deficits Perception  Perception: Within Functional Limits Praxis Praxis: Intact Cognition Overall  Cognitive Status: Within Functional Limits for tasks assessed Arousal/Alertness: Awake/alert Orientation Level: Person;Place;Situation Person: Oriented Place: Oriented Situation: Oriented Year: 2018 Month: July Day of Week: Correct Memory: Appears intact Immediate Memory Recall: Sock;Blue;Bed Memory Recall: Sock;Blue;Bed Memory Recall Sock: Without Cue Memory Recall Blue: Without Cue Memory Recall Bed: Without  Cue Attention: Selective Selective Attention: Appears intact Awareness: Appears intact Safety/Judgment: Appears intact Sensation Sensation Light Touch: Appears Intact Hot/Cold: Appears Intact Proprioception: Appears Intact Coordination Gross Motor Movements are Fluid and Coordinated: No Fine Motor Movements are Fluid and Coordinated: Yes Finger Nose Finger Test: slow due to limited shoulder ROM, but fluid Motor  Motor Motor - Skilled Clinical Observations: generalized weakness Mobility     Trunk/Postural Assessment  Cervical Assessment Cervical Assessment: Within Functional Limits Thoracic Assessment Thoracic Assessment:  (mild kyphosis) Lumbar Assessment Lumbar Assessment:  (posterior pelvic tilt) Postural Control Postural Control: Deficits on evaluation Righting Reactions: delayed  Balance Dynamic Sitting Balance Sitting balance - Comments: able to sit edge of bed and reach with bilat UEs Extremity/Trunk Assessment RUE Assessment RUE Assessment: Exceptions to [facility] (Shoulder flexion grossly 50* AROM, strength grossly 2/5 at shoulder, WNL PROM, elbow WFL, loose gross grasp.) LUE Assessment LUE Assessment: Exceptions to [facility] (shoulder flexion grossly 70* AROM, strength 2/5, WNL PROM, elbow WFL, loose gross grasp)   See Function Navigator for Current Functional Status.   Refer to Care Plan for Long Term Goals  Recommendations for other services: Therapeutic Recreation  Stress management and Outing/community reintegration   Discharge Criteria: Patient will  be discharged from OT if patient refuses treatment 3 consecutive times without medical reason, if treatment goals not met, if there is a change in medical status, if patient makes no progress towards goals or if patient is discharged from hospital.  The above assessment, treatment plan, treatment alternatives and goals were discussed and mutually agreed upon: by patient  [name] 05/08/2017, 11:31 AM

## 2017-05-08 NOTE — Evaluation (Signed)
Physical Therapy Assessment and Plan  Patient Details  Name: [name] MRN: [id number] Date of Birth: Jun 17, 1951  PT Diagnosis: Difficulty walking and Muscle weakness Rehab Potential: Good ELOS: 21-24 days   Today's Date: 05/08/2017 PT Individual Time: 8341-9622 and 1430-1510 PT Individual Time Calculation (min): 55 min  And 40 min  Problem List:  Patient Active Problem List   Diagnosis Date Noted  . Skin ulcer of right heel ([place])   . PAF (paroxysmal atrial fibrillation) ([place])   . Hypotension   . Leukocytosis   . Anemia of chronic disease   . Anxiety state   . Depression   . Prediabetes   . Stage 5 chronic kidney disease not on chronic dialysis ([place])   . 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 Pressure injury of skin 04/19/2017  . Acute renal failure (ARF) ([place])   . Cellulitis of lower extremity   . Edema extremities   . Acute respiratory failure ([place])   . Acute renal failure ([place])   . Multi-organ failure with heart failure ([place])   . Palliative care by specialist   . Goals of care, counseling/discussion   . Septic shock ([place]) 04/08/2017    Past Medical History:  Past Medical History:  Diagnosis Date  . A-fib ([place])    on pradaxa  . Chronic kidney disease   . Dysrhythmia   . Hypertension   . Hypothyroidism   . Thyroid disease    Past Surgical History:  Past Surgical History:  Procedure Laterality Date  . DIALYSIS/PERMA CATHETER INSERTION N/A 04/26/2017   Procedure: Dialysis/Perma Catheter Insertion;  Surgeon: Algernon Huxley, MD;  Location: [facility];  Service: Cardiovascular;  Laterality: N/A;  . WRIST ARTHROSCOPY      Assessment & Plan Clinical Impression: Patient is a 66 y.o. year old female with recent admission to the hospital on 04/08/17 due to BLE cellulitis with difficulty walking. She was found to be septic with leucocytosis and hypotensive requiring fluid bolus and pressors.  Per records, she has not had any meds X 3 months and had two week  history of DOE, BLE edema with fatigue and weakness. She was started on broad spectrum antibiotics and was noted to be volume overloaded with right pleural effusion. CRRT initiated and  hospital course significant for PEA arrest 7/4, ARDS, anuria requiring  IJ placement by vascular surgery as now HD dependent, shocked liver with abnormal LFTs, thrombocytopenia, ongoing issues with hypotension and tachycardia.  She developed BUE edema and dopplers done revealing "occlusive deep vein thrombosis in the left cephalic vein extending from the mid humeral region down towards the elbow".   Pressors weaned off and ProAmatine added to help with BP support. She continues on amiodarone and Cardizem for HR control and Dr. [name] recommended coumadin or low dose Eliquis due to A fib/stroke prophylaxis. 2 D echo revealed EF 25-30% with mild concentric hypertrophy, moderate AVR, moderate TVR and mild MVR.  Serologic work up negative and limited hope for renal recovery per nephrology--IV albumin being used during HD treatment prn.  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  Patient transferred to CIR on 05/07/2017 .   Patient currently requires total with mobility secondary to muscle weakness and decreased standing balance, decreased postural control and decreased balance strategies.  Prior to hospitalization, patient was independent  with mobility and lived with Spouse in a House home.  Home access is 4Stairs to enter.  Patient will benefit from skilled  PT intervention to maximize safe functional mobility, minimize fall risk and decrease caregiver burden for planned discharge home with 24 hour supervision.  Anticipate patient will benefit from follow up [place] at discharge.  PT - End of Session Activity Tolerance: Tolerates 30+ min activity with multiple rests Endurance Deficit: Yes PT Assessment Rehab Potential (ACUTE/IP ONLY): Good PT Barriers to Discharge: [place] home  environment;Wound Care;Hemodialysis PT Barriers to Discharge Comments: 4 stairs to enter home, needs to get to/from dialysis at d/c PT Patient demonstrates impairments in the following area(s): Balance;Endurance;Motor PT Transfers Functional Problem(s): Bed Mobility;Bed to Chair;Car;Furniture PT Locomotion Functional Problem(s): Ambulation;Stairs;Wheelchair Mobility PT Plan PT Intensity: Minimum of 1-2 x/day ,45 to 90 minutes PT Frequency: 5 out of 7 days PT Duration Estimated Length of Stay: 21-24 days PT Treatment/Interventions: Ambulation/gait training;Discharge planning;DME/adaptive equipment instruction;Functional mobility training;Splinting/orthotics;Therapeutic Activities;UE/LE Strength taining/ROM;Pain management;Wheelchair propulsion/positioning;UE/LE Coordination activities;Therapeutic Exercise;Stair training;Patient/family education;Neuromuscular re-education;Functional electrical stimulation;Community reintegration;Balance/vestibular training PT Transfers Anticipated Outcome(s): min A PT Locomotion Anticipated Outcome(s): supervision w/c level PT Recommendation Recommendations for Other Services: Therapeutic Recreation consult Therapeutic Recreation Interventions: Stress management Follow Up Recommendations: Home health PT;24 hour supervision/assistance Patient destination: Home Equipment Recommended: To be determined  Skilled Therapeutic Intervention Pt participated in skilled PT eval and was educated on PT goals and POC.  Attempted squat pivot transfer, pt unable with max A. Sliding board transfers with +2 assist for safety.  W/c mobility 10' with min A due to UE weakness.  Attempt standing in stedy with max A and +2 assist, pt unable to stand.  Blocked practice sliding board transfers with max A multiple attempts.  Pt encouraged to sit up until next session.  Time 2: no c/o pain. Attempted standing in [place], pt unable with max A.  Session focused on w/c mobility with bilat UEs.   Pt able to propel up to 84' with supervision in controlled environment, 30' on carpet before requiring rest due to UE fatigue. Pt propelled 3 x 50', 2 x 30' during session. Sliding board transfer back to bed with max/total A.  PT Evaluation Precautions/Restrictions Precautions Precautions: Fall Restrictions Weight Bearing Restrictions: No Pain Pain Assessment Pain Assessment: No/denies pain Home Living/Prior Functioning Home Living Available Help at Discharge: Family;Available 24 hours/day Type of Home: House Home Access: Stairs to enter [employer] of Steps: 4 Entrance Stairs-Rails: Right Home Layout: One level  Lives With: Spouse Prior Function Level of Independence: Independent with gait;Independent with transfers;Independent with basic ADLs  Able to Take Stairs?: Yes Driving: Yes Vocation: Retired  Associate Professor Overall Cognitive Status: Within Functional Limits for tasks assessed Sensation Sensation Light Touch: Appears Intact Proprioception: Appears Intact Coordination Gross Motor Movements are Fluid and Coordinated: No Motor  Motor Motor - Skilled Clinical Observations: generalized weakness   Trunk/Postural Assessment  Cervical Assessment Cervical Assessment: Within Functional Limits Thoracic Assessment Thoracic Assessment:  (mild kyphosis) Lumbar Assessment Lumbar Assessment:  (posterior pelvic tilt) Postural Control Postural Control: Deficits on evaluation Righting Reactions: delayed  Balance Dynamic Sitting Balance Sitting balance - Comments: able to sit edge of bed and reach with bilat UEs Extremity Assessment      RLE Assessment RLE Assessment:  (grossly 3-/5) LLE Assessment LLE Assessment:  (grossly 3-/5)   See Function Navigator for Current Functional Status.   Refer to Care Plan for Long Term Goals  Recommendations for other services: Therapeutic Recreation  Stress management  Discharge Criteria: Patient will be discharged from  PT if patient refuses treatment 3 consecutive times without medical reason, if treatment goals not met,  if there is a change in medical status, if patient makes no progress towards goals or if patient is discharged from hospital.  The above assessment, treatment plan, treatment alternatives and goals were discussed and mutually agreed upon: by patient  [facility] 05/08/2017, 9:33 AM

## 2017-05-09 ENCOUNTER — Inpatient Hospital Stay (HOSPITAL_COMMUNITY): Payer: Medicare Other | Admitting: *Deleted

## 2017-05-09 ENCOUNTER — Encounter (HOSPITAL_COMMUNITY): Payer: Medicare Other | Admitting: Psychology

## 2017-05-09 ENCOUNTER — Inpatient Hospital Stay (HOSPITAL_COMMUNITY): Payer: Medicare Other | Admitting: Physical Therapy

## 2017-05-09 ENCOUNTER — Inpatient Hospital Stay (HOSPITAL_COMMUNITY): Payer: Medicare Other | Admitting: Speech Pathology

## 2017-05-09 ENCOUNTER — Inpatient Hospital Stay (HOSPITAL_COMMUNITY): Payer: Medicare Other | Admitting: Occupational Therapy

## 2017-05-09 LAB — RENAL FUNCTION PANEL
Albumin: 3.5 g/dL (ref 3.5–5.0)
Anion gap: 18 — ABNORMAL HIGH (ref 5–15)
BUN: 74 mg/dL — AB (ref 6–20)
CO2: 24 mmol/L (ref 22–32)
CREATININE: 3.97 mg/dL — AB (ref 0.44–1.00)
Calcium: 9.2 mg/dL (ref 8.9–10.3)
Chloride: 98 mmol/L — ABNORMAL LOW (ref 101–111)
GFR calc Af Amer: 13 mL/min — ABNORMAL LOW (ref >60)
GFR, EST NON AFRICAN AMERICAN: 11 mL/min — AB (ref >60)
GLUCOSE: 79 mg/dL (ref 65–99)
POTASSIUM: 5.4 mmol/L — AB (ref 3.5–5.1)
Phosphorus: 4.8 mg/dL — ABNORMAL HIGH (ref 2.5–4.6)
SODIUM: 140 mmol/L (ref 135–145)

## 2017-05-09 LAB — GLUCOSE, CAPILLARY
GLUCOSE-CAPILLARY: 85 mg/dL (ref 65–99)
Glucose-Capillary: 101 mg/dL — ABNORMAL HIGH (ref 65–99)
Glucose-Capillary: 91 mg/dL (ref 65–99)
Glucose-Capillary: 87 mg/dL (ref 65–99)

## 2017-05-09 MED ORDER — NEPRO/CARBSTEADY PO LIQD
237.0000 mL | Freq: Every day | ORAL | Status: DC
  Administered 2017-05-09 – 2017-05-13 (×3): 237 mL via ORAL

## 2017-05-09 MED ORDER — WHITE PETROLATUM GEL
Status: AC
  Administered 2017-05-09: 05:00:00
  Filled 2017-05-09: qty 1

## 2017-05-09 NOTE — Progress Notes (Signed)
Occupational Therapy Session Note  Patient Details  Name: [name] MRN: [id number] Date of Birth: 1951-03-11  Today's Date: 05/09/2017 OT Individual Time: 0930-1040 and 1300-1330 OT Individual Time Calculation (min): 70 min and 30 min   Short Term Goals: Week 1:  OT Short Term Goal 1 (Week 1): Pt will complete sit > stand in [place] with one person assist to decrease burden of care OT Short Term Goal 2 (Week 1): Pt will complete slide board transfer with max assist of one caregiver OT Short Term Goal 3 (Week 1): Pt will complete don pants with max assist at bed level  Skilled Therapeutic Interventions/Progress Updates:    1) Treatment session with focus on bed mobility and upright tolerance.  Engaged in LB bathing and dressing at bed level with pt requiring mod assist for rolling with use of bed rails and bed pad to fully adjust.  Required total assist for LB bathing even assist to lift legs to wash underneath and to assist with rolling.  Donned pants at bed level with total assist and standing in [place] with +2 to pull pants over hips.  Engaged in [place] bathing in partial standing in [place] with min assist for sitting balance and assist for anterior weight shift when reaching forward to obtain items.  Pt still with no shirt, therefore donned hospital gown - requiring assist due to limited shoulder ROM.  Grooming tasks completed in sitting in w/c with setup to obtain items and assist to brush hair due to limited shoulder ROM.  Attempted to engage in [place] with lower quadrants, pt requiring hand over hand assist to reach 75-90*.  Returned to room and left upright in w/c with all needs in reach.  2) Treatment session with focus on functional use of BUE.  Engaged in [place] with pt donning shirt overhead first and then requiring increased time but no cues or assist to thread UE.  Pt did require assist to lean forward and have therapist pull shirt down over torso.  Engaged in table top activity in  sitting with focus on increased use of UE with reaching for items to increase shoulder flexion to 90* in supported position.  Pt required increased time and cues to complete puzzle activity due to decreased problem solving and awareness (question due to puzzle not being a typical leisure pastime).    Therapy Documentation Precautions:  Precautions Precautions: Fall Restrictions Weight Bearing Restrictions: No Pain:  Pt with no c/o pain  See Function Navigator for Current Functional Status.   Therapy/Group: Individual Therapy  [name] 05/09/2017, 12:08 PM

## 2017-05-09 NOTE — Progress Notes (Signed)
Admit Date: 05/07/2017 LOS: 2 days   [name] is a 66 y/o F with CKD IIIa, HTN, hypothyroidism, HFrEF (LVEF 25-30%) and AFib on Pradaxa admitted 7/1 to [place] with septic and cardiogenic shock with MODS (2/2 LE cellulitis), including anuric AKI and has been HD dependent for 1 month. Received Vanc/zosyn & contrast exposure. Initially on CRRT however transitioned to [facility] MWF. Her hospital course was also complicated by PEA arrest, LUE DVT and persistent hypotension. She is currently in physical rehab following her prolonged hospitalization.   Hx: IV Contrast Yes, 04/04/17 with CT ab/pelvis  Received Vanc/Zosyn Hypotension Yes, on pressors recent during hospitalization, now on Midodrine.  Subjective:  Feels well today. No complaints other than continued weakness. Feels the sensation that she might be urinating and was told by RN that she was damp in the area. No recorded UOP. No CP, SOB, confusion, decreased appetite, abdominal pain.   Intake/Output Summary (Last 24 hours) at 05/09/17 0914 Last data filed at 05/09/17 0439  Gross per 24 hour  Intake              540 ml  Output                0 ml  Net              540 ml   Filed Weights   05/07/17 1846 05/08/17 0500 05/09/17 0514  Weight: 177 lb (80.3 kg) 171 lb (77.6 kg) 169 lb (76.7 kg)   Current IP Medications . amiodarone  200 mg Oral BID  . apixaban  2.5 mg Oral BID  . collagenase   Topical Daily  . darbepoetin (ARANESP) injection - DIALYSIS  60 mcg Intravenous Q Wed-HD  . diltiazem  120 mg Oral Daily  . famotidine  20 mg Oral QHS  . feeding supplement (NEPRO CARB STEADY)  237 mL Oral Q1500  . feeding supplement (PRO-STAT SUGAR FREE 64)  30 mL Oral BID  . insulin aspart  0-15 Units Subcutaneous TID WC  . insulin aspart  0-5 Units Subcutaneous QHS  . levothyroxine  100 mcg Oral QAC breakfast  . midodrine  5 mg Oral TID WC  . multivitamin  1 tablet Oral QHS  . QUEtiapine  25 mg Oral QHS  . sertraline  25 mg Oral QHS  .  sevelamer carbonate  1,600 mg Oral TID WC  Continuous Infusion Medications: None  PRN Medications:  acetaminophen, aluminum hydroxide, artificial tears, bisacodyl, diphenhydrAMINE, guaiFENesin-dextromethorphan, ipratropium-albuterol, ondansetron (ZOFRAN) IV, phenol, polyethylene glycol, prochlorperazine **OR** prochlorperazine **OR** prochlorperazine, zinc oxide  Current Labs: Reviewed.  BMP Latest Ref Rng & Units 05/07/2017 05/04/2017 05/02/2017  Glucose 65 - 99 mg/dL 80 154(H) 74  BUN 6 - 20 mg/dL 81(H) 55(H) 72(H)  Creatinine 0.44 - 1.00 mg/dL 4.47(H) 2.86(H) 3.08(H)  Sodium 135 - 145 mmol/L 140 140 140  Potassium 3.5 - 5.1 mmol/L 4.8 3.8 4.6  Chloride 101 - 111 mmol/L 100(L) 99(L) 98(L)  CO2 22 - 32 mmol/L 27 28 28   Calcium 8.9 - 10.3 mg/dL 9.0 8.6(L) 8.7(L)   CBC    Component Value Date/Time   WBC 13.5 (H) 05/07/2017 1127   RBC 2.79 (L) 05/07/2017 1127   HGB 9.1 (L) 05/07/2017 1127   HGB 14.4 09/19/2014 0621   HCT 27.2 (L) 05/07/2017 1127   HCT 43.3 09/19/2014 0621   PLT 288 05/07/2017 1127   PLT 225 09/19/2014 0621   MCV 97.5 05/07/2017 1127   MCV 98 09/19/2014 1941  MCH 32.5 05/07/2017 1127   MCHC 33.3 05/07/2017 1127   RDW 22.4 (H) 05/07/2017 1127   RDW 12.6 09/19/2014 0621   LYMPHSABS 1.3 04/27/2017 0415   LYMPHSABS 2.4 09/19/2014 0621   MONOABS 1.1 (H) 04/27/2017 0415   MONOABS 0.6 09/19/2014 0621   EOSABS 0.1 04/27/2017 0415   EOSABS 0.1 09/19/2014 0621   BASOSABS 0.1 04/27/2017 0415   BASOSABS 0.0 09/19/2014 0621   Physical Exam  Blood pressure 120/75, pulse (!) 106, temperature 97.8 F (36.6 C), temperature source Oral, resp. rate 18, height 5\' 5"  (1.651 m), weight 169 lb (76.7 kg), SpO2 100 %. GEN: Alert and oriented, pleasant and conversant CV: RRR, diastolic murmur PULM: CTAB ABD: Soft, nontender  SKIN: No rash. LLE wrapped in clean dry bandanges EXT: Minimal LE edema  Assessment [name] is a 66yo female recently admitted to [facility] with septic  and cardiogenic shock with multiorgan failure 2/2 cellulitis including anuric AKI for which she was started on CRRT and transitioned to intermittent HD (MWF). Hospital course complicated by PEA arrest, UE DVT; she received vanc/zosyn and had contrast exposure. She is now in physical rehabilitation following her month long hospitalization. 1. ESRD - iHD MWF via RIJ permacath. Previous renal function at CKD stage 3. She remains anuric following septic and cardiogenic shock requiring pressor support, contrast exposure and exposure to Vanc/zosyn. Serologies have been negative.  Her schedule here is MWF, a OP has been set up for TTS. She is on midodrine for pressure support. 2. Anemia - On aranesp qwed now 3. Hyperphosphatemia - Continue Renvela 4. A fib - on diltiazem, amiodarone; eliquis 5. Hypothyroidism - on levothyroxine  Plan 1. HD today, on MWF schedule however TTS arranged for outpatient at [facility] with Dr. Holley Raring. Will try to get fistula towards end of hospitalization should she remain HD dependent. Possibly some UOP ?? 2. Continue Midodrine.  3. Aranesp every Wednesday with HD sessions,  Iron studies pending 4. Renal panel pend 5. PTH pending, on Renvela 6. Daily weights, strict ins/outs, fluid restriction 1236ml  Einar Gip, DO - PGY2 Pager: [phone] 05/09/2017, 8:58 AM   Patient seen and examined, agree with above note with above modifications. Patient looking well but dissipating in rehabilitation. Feels like she may have been incontinent of some urine. For now, continuing routine hemodialysis Monday, Wednesday, Friday with next treatment planned for later today. Follow labs and act as needed Corliss Parish, MD 05/09/2017

## 2017-05-09 NOTE — Progress Notes (Signed)
[place] PHYSICAL MEDICINE & REHABILITATION     PROGRESS NOTE  Subjective/Complaints:  Pt seen laying in bed this AM.  Se states she slept very well overnight and had a very food day in therapies yesterday.  She did not have her PRavalon boots on this AM.    ROS: Denies CP, SOB, N/V/D.  Objective: Vital Signs: Blood pressure 120/75, pulse (!) 106, temperature 97.8 F (36.6 C), temperature source Oral, resp. rate 18, height 5\' 5"  (1.651 m), weight 76.7 kg (169 lb), SpO2 100 %. No results found.  Recent Labs  05/07/17 1127  WBC 13.5*  HGB 9.1*  HCT 27.2*  PLT 288    Recent Labs  05/07/17 1127  NA 140  K 4.8  CL 100*  GLUCOSE 80  BUN 81*  CREATININE 4.47*  CALCIUM 9.0   CBG (last 3)   Recent Labs  05/08/17 1626 05/08/17 2147 05/09/17 0631  GLUCAP 90 107* 85    Wt Readings from Last 3 Encounters:  05/09/17 76.7 kg (169 lb)  05/07/17 75.3 kg (166 lb 0.1 oz)    Physical Exam:  BP 120/75 (BP Location: Right Arm)   Pulse (!) 106   Temp 97.8 F (36.6 C) (Oral)   Resp 18   Ht 5\' 5"  (1.651 m)   Wt 76.7 kg (169 lb)   SpO2 100%   BMI 28.12 kg/m  Constitutional: She appears well-developed and well-nourished.  HENT: Normocephalic. Atraumatic. Eyes: EOMI. No discharge.  Cardiovascular: An irregularly irregular rhythm present.  Murmur heard. Respiratory: Effort normal. No stridor.  GI: Soft. Bowel sounds are normal.   Musculoskeletal: She exhibits edema and tenderness.  Neurological: She is alert and oriented.  Able to follow commands without difficulty.  Motor: B/l UE 4/5 proximal to distal B/l LE; HF 2/5, KE 2+/5, ADF/PF 3/5 Skin: LLE with ulcerations with eschar on dorsum of foot and distal tibia. Psychiatric: She has a normal mood and affect. Her behavior is normal. Thought content normal.    Assessment/Plan: 1. 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 Physiatrist is providing close team supervision and 24 hour management of active medical problems listed below. Physiatrist and rehab team continue to assess barriers to discharge/monitor patient progress toward functional and medical goals.  Function:  Bathing Bathing position   Position: Wheelchair/chair at sink  Bathing parts Body parts bathed by patient: Right arm, Chest, Abdomen Body parts bathed by helper: Left arm, Front perineal area, Buttocks, Right upper leg, Left upper leg, Right lower leg, Left lower leg, Back  Bathing assist Assist Level: 2 helpers      Upper Body Dressing/Undressing Upper body dressing   What is the patient wearing?: Hospital gown                Upper body assist Assist Level: Touching or steadying assistance(Pt > 75%)      Lower Body Dressing/Undressing Lower body dressing   What is the patient wearing?: Pants, Non-skid slipper socks       Pants- Performed by helper: Thread/unthread right pants leg, Thread/unthread left pants leg, Pull pants up/down   Non-skid slipper socks- Performed by helper: Don/doff right sock, Don/doff left sock                  Lower body assist Assist for lower body dressing:  (Total assist)      Toileting Toileting Toileting activity did not occur: No continent  bowel/bladder event        Toileting assist     Transfers Chair/bed transfer     Chair/bed transfer assist level: 2 helpers Chair/bed transfer assistive device: Mechanical lift Mechanical lift: Ecologist Ambulation activity did not occur: Safety/medical concerns         Wheelchair   Type: Manual Max wheelchair distance: 10' Assist Level: Touching or steadying assistance (Pt > 75%)  Cognition Comprehension Comprehension assist level: Follows complex conversation/direction with no assist  Expression Expression assist level: Expresses basic needs/ideas: With no assist  Social Interaction Social  Interaction assist level: Interacts appropriately with others with medication or extra time (anti-anxiety, antidepressant).  Problem Solving Problem solving assist level: Solves basic 90% of the time/requires cueing < 10% of the time  Memory Memory assist level: More than reasonable amount of time    Medical Problem List and Plan: 1.  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  DVT Prophylaxis/Anticoagulation: Pharmaceutical: Other (comment)--Eliquis 3. Pain Management: tylenol prn 4. Mood: LCSW to follow for evaluation and support.  5. Neuropsych: This patient is capable of making decisions on her own behalf. 6. Skin/Wound Care: routine pressure relief measures. Prevalon boot for heel ulcer.  7. Fluids/Electrolytes/Nutrition: Strict I/Os. Monitor daily weights.   8.  Hypotension: Monitor orthostatic BP. Added abdominal binder and TEDs to help with BP support.   Controlled 8/1 9. Afib: Monitor HR bid--on eliquis for stroke prevention. Continue amiodarone bid and Cardizem daily.  10. Leucocytosis:              Patient with heel ulcer--appreciate WOC recs             Afebrile             WBCs 13.5 on 7/30             Cont to monitor 11. Anemia of chronic disease: On epogen  MWF.              Hb 9.1 on 7/30             Cont to montior 12. Left foot ulcer: Daily dressing changes with santyl. Monitor for signs of infection.  13. Anxiety/depression: On Seroquel and Zoloft at nights.  14. Prediabetes: Will consult dietitian to educate patein ton diet.  Hgb A1c- 6.2. Monitor BS ac/hs. Use SSI for elevated BS  Controlled 8/1 15. CKD now HD dependent: HD on MWF at end of day to help with tolerance of therapy. Renal diet--Reducate patient on appropriate diet.    LOS (Days) 2 A FACE TO FACE EVALUATION WAS PERFORMED  Arne Schlender Lorie Phenix 05/09/2017 8:48 AM

## 2017-05-09 NOTE — Progress Notes (Signed)
Social Work  Social Work Assessment and Plan  Patient Details  Name: [name] MRN: [id number] Date of Birth: 06/14/1951  Today's Date: 05/09/2017  Problem List:  Patient Active Problem List   Diagnosis Date Noted  . Skin ulcer of right heel ([place])   . PAF (paroxysmal atrial fibrillation) ([place])   . Hypotension   . Leukocytosis   . Anemia of chronic disease   . Anxiety state   . Depression   . Prediabetes   . Stage 5 chronic kidney disease not on chronic dialysis ([place])   . 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 Pressure injury of skin 04/19/2017  . Acute renal failure (ARF) ([place])   . Cellulitis of lower extremity   . Edema extremities   . Acute respiratory failure ([place])   . Acute renal failure ([place])   . Multi-organ failure with heart failure ([place])   . Palliative care by specialist   . Goals of care, counseling/discussion   . Septic shock ([place]) 04/08/2017   Past Medical History:  Past Medical History:  Diagnosis Date  . A-fib ([place])    on pradaxa  . Chronic kidney disease   . Dysrhythmia   . Hypertension   . Hypothyroidism   . Thyroid disease    Past Surgical History:  Past Surgical History:  Procedure Laterality Date  . DIALYSIS/PERMA CATHETER INSERTION N/A 04/26/2017   Procedure: Dialysis/Perma Catheter Insertion;  Surgeon: Algernon Huxley, MD;  Location: [facility];  Service: Cardiovascular;  Laterality: N/A;  . WRIST ARTHROSCOPY     Social History:  reports that she has never smoked. She has never used smokeless tobacco. She reports that she does not drink alcohol or use drugs.  Family / Support Systems Marital Status: Married How Long?: 22 yrs Patient Roles: Spouse, Parent Spouse/Significant Other: Remo Lipps @ (C) [phone] Anticipated Caregiver: Spouse  Ability/Limitations of Caregiver: he works part time but will arrange care/provide 24/7 Caregiver Availability: 24/7 Family Dynamics: [employer] very encouraging and supportive of pt.  States, "This whole  thing shook my world.  I'm not going to let anything happen to her."  Social History Preferred language: English Religion: Baptist Cultural Background: NA Education: college Read: Yes Write: Yes Employment Status: Retired Freight forwarder Issues: None Guardian/Conservator: None - per MD, pt capable of making decisions on her own behalf   Abuse/Neglect Physical Abuse: Denies Verbal Abuse: Denies Sexual Abuse: Denies Exploitation of patient/patient's resources: Denies Self-Neglect: Denies  Emotional Status Pt's affect, behavior adn adjustment status: Pt very pleasant, talkative and completes interview without difficulty.  When questioned about mood and this lengthy hospitalization and new to HD, she reports that she feels she is "doing pretty well."  States, "I just keep realizing that I'm lucky just to be here.  I can deal with the dialysis part."  She does become a little tearful as husband talks about fear of "losing her."  Referred for neuropsychology who was able to meet with her today. Recent Psychosocial Issues: None Pyschiatric History: none Substance Abuse History: None  Patient / Family Perceptions, Expectations & Goals Pt/Family understanding of illness & functional limitations: Pt and spouse have a very good understanding of her complicated medical course, current issues and functional limitations/ need for CIR. Premorbid pt/family roles/activities: Pt was completely independent and active at home and in community. Anticipated changes in roles/activities/participation: Pt's spouse assuming primary caregiver support role as she will need physical asisstance. Pt/family expectations/goals: "I just want to get as much strength back as  I can."  [employer]: None Premorbid Home Care/DME Agencies: None Transportation available at discharge: yes Resource referrals recommended: Neuropsychology  Discharge Planning Living Arrangements:  Spouse/significant other Support Systems: Spouse/significant other Type of Residence: Private residence Insurance underwriter Resources: [employer] ([place] Medicare) Museum/gallery curator Resources: [place] Referred: No Living Expenses: Own Money Management: Spouse Does the patient have any problems obtaining your medications?: No Home Management: pt and spouse Patient/Family Preliminary Plans: Pt to d/c home with spouse providing all needed DME Social Work Anticipated Follow Up Needs: HH/OP Expected length of stay: 19-21 days   Clinical Impression Very pleasant woman here after a lengthy hospitalization and with debility and critical illness myelopathy.  New HD pt now as well.  She and husband very eager for CIR and hopeful she will make good gains but realistic she will require 24/7 assistance at home.  Husband very supportive and engaged and fully prepared to provide this.  Pt reports her mood has been stable and she has "come to terms" with need for HD.  Will follow for support and d/c planning needs.  Riaz Onorato 05/09/2017, 5:07 PM

## 2017-05-09 NOTE — Evaluation (Signed)
Speech Language Pathology Assessment and Plan  Patient Details  Name: [name] MRN: [id number] Date of Birth: 1951/06/30   Today's Date: 05/09/2017 SLP Individual Time: 1335-1420 SLP Individual Time Calculation (min): 45 min   Problem List:  Patient Active Problem List   Diagnosis Date Noted  . Skin ulcer of right heel ([place])   . PAF (paroxysmal atrial fibrillation) ([place])   . Hypotension   . Leukocytosis   . Anemia of chronic disease   . Anxiety state   . Depression   . Prediabetes   . Stage 5 chronic kidney disease not on chronic dialysis ([place])   . 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 Pressure injury of skin 04/19/2017  . Acute renal failure (ARF) ([place])   . Cellulitis of lower extremity   . Edema extremities   . Acute respiratory failure ([place])   . Acute renal failure ([place])   . Multi-organ failure with heart failure ([place])   . Palliative care by specialist   . Goals of care, counseling/discussion   . Septic shock ([place]) 04/08/2017   Past Medical History:  Past Medical History:  Diagnosis Date  . A-fib ([place])    on pradaxa  . Chronic kidney disease   . Dysrhythmia   . Hypertension   . Hypothyroidism   . Thyroid disease    Past Surgical History:  Past Surgical History:  Procedure Laterality Date  . DIALYSIS/PERMA CATHETER INSERTION N/A 04/26/2017   Procedure: Dialysis/Perma Catheter Insertion;  Surgeon: Algernon Huxley, MD;  Location: [facility];  Service: Cardiovascular;  Laterality: N/A;  . WRIST ARTHROSCOPY      Assessment / Plan / Recommendation Clinical Impression  [name] an 66 y.o.female with history of HTN, A fib-on pradaxa who was admitted on [facility] on 04/08/17 due to BLE cellulitis with difficulty walking. She was found to be septic with leucocytosis and hypotensive requiring fluid bolus and pressors.  Per records, she has not had any meds X 3 months and had two week history of DOE, BLE edema with fatigue and weakness. She was started on broad  spectrum antibiotics and was noted to be volume overloaded with right pleural effusion. CRRT initiated and  hospital course significant for PEA arrest 7/4, ARDS, anuria requiring  IJ placement by vascular surgery as now HD dependent, shocked liver with abnormal LFTs, thrombocytopenia, ongoing issues with hypotension and tachycardia.  She developed BUE edema and dopplers done revealing "occlusive deep vein thrombosis in the left cephalic vein extending from the mid humeral region down towards the elbow".   Pressors weaned off and ProAmatine added to help with BP support. She continues on amiodarone and Cardizem for HR control and Dr. [name] recommended coumadin or low dose Eliquis due to A fib/stroke prophylaxis. 2 D echo revealed EF 25-30% with mild concentric hypertrophy, moderate AVR, moderate TVR and mild MVR.  Serologic work up negative and limited hope for renal recovery per nephrology--IV albumin being used during HD treatment prn.  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 Therapy working on pre-gait activity and CIR recommended due to substantial deficits in mobility and ability to carry out ADL tasks.  SLP evaluation was completed on 05/09/2017: Pt presents with grossly intact cognitive-linguistic function.  Pt scored 26/30 on the MoCA standardized cognitive assessment (n>/=26) and reports return to cognitive baseline.  Pt's speech is fluent and free from dysarthria or word finding difficulty.  As a result, do not recommend ST follow up  at this time.    Skilled Therapeutic Interventions          Cognitive-linguistic evaluation completed with results and recommendations reviewed with patient.     SLP Assessment  Patient does not need any further Speech Lanaguage Pathology Services    Recommendations  Follow up Recommendations: None Equipment Recommended: None recommended by SLP           Pain Pain Assessment Pain Assessment: No/denies  pain  Prior Functioning Cognitive/Linguistic Baseline: Within functional limits Type of Home: House  Lives With: Spouse Available Help at Discharge: Family;Available 24 hours/day Education: high school  Vocation: Retired  Function:  Eating Eating                 Cognition Comprehension Comprehension assist level: Follows complex conversation/direction with no assist  Expression   Expression assist level: Expresses complex ideas: With extra time/assistive device  Social Interaction Social Interaction assist level: Interacts appropriately with others - No medications needed.  Problem Solving Problem solving assist level: Solves complex problems: With extra time  Memory Memory assist level: More than reasonable amount of time   Refer to Care Plan for Long Term Goals  Recommendations for other services: None   Discharge Criteria: Patient will be discharged from SLP if patient refuses treatment 3 consecutive times without medical reason, if treatment goals not met, if there is a change in medical status, if patient makes no progress towards goals or if patient is discharged from hospital.  The above assessment, treatment plan, treatment alternatives and goals were discussed and mutually agreed upon: by patient  Emilio Math 05/09/2017, 3:04 PM

## 2017-05-09 NOTE — Consult Note (Signed)
Neuropsychological Consultation   Patient:   [name]   DOB:   Jul 28, 1951  MR Number:  [id number]  Location:  [facility] [id number] [place] [address] Dept: [phone] MCN: [phone]           Date of Service:   05/09/2017  Start Time:   8 AM End Time:   9 AM  Provider/Observer:  Ilean Skill, Psy.D.       Clinical Neuropsychologist       Billing Code/Service: 29476 4 Units  Chief Complaint:    [name] is a 66 year old female admitted 04/08/2017 due to BLE cellulitis with difficulty walking.  She was found to be septic and hypotensive.  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    Reason for Service:  The patient was referred for a neuropsychological consultation due to concerns about adjustment issues and concerns about ongoing depressive and anxiety symptoms.  Below is the full HPI for the current admission.   HPI: [name] an 66 y.o.female with history of HTN, A fib-on pradaxa who was admitted on [facility] on 04/08/17 due to BLE cellulitis with difficulty walking. She was found to be septic with leucocytosis and hypotensive requiring fluid bolus and pressors.  Per records, she has not had any meds X 3 months and had two week history of DOE, BLE edema with fatigue and weakness. She was started on broad spectrum antibiotics and was noted to be volume overloaded with right pleural effusion. CRRT initiated and  hospital course significant for PEA arrest 7/4, ARDS, anuria requiring  IJ placement by vascular surgery as now HD dependent, shocked liver with abnormal LFTs, thrombocytopenia, ongoing issues with hypotension and tachycardia.  She developed BUE edema and dopplers done revealing "occlusive deep vein thrombosis in the left cephalic vein extending from the mid humeral region down towards the elbow".    Pressors weaned off and ProAmatine added to help with BP support. She continues on amiodarone and Cardizem for HR control and Dr. [name] recommended coumadin or low dose Eliquis due to A fib/stroke prophylaxis. 2 D echo revealed EF 25-30% with mild concentric hypertrophy, moderate AVR, moderate TVR and mild MVR.  Serologic work up negative and limited hope for renal recovery per nephrology--IV albumin being used during HD treatment prn.  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 Therapy working on pre-gait activity and CIR recommended due to substantial deficits in mobility and ability to carry out ADL tasks.  Current Status:  The patient reports that while she has had an extended stay and is now facing chronic and ongoing medical care due to loss of renal function with limited hope of recovery, she has been coping better as she sees the progress and plan for rehab and eventually going back home to her husband.  Behavioral Observation: [name]  presents as a 66 y.o.-year-old Right Caucasian Female who appeared her stated age. her dress was Appropriate and she was Well Groomed and her manners were Appropriate to the situation.  her participation was indicative of Appropriate and Attentive behaviors.  There were any physical disabilities noted.  she displayed an appropriate level of cooperation and motivation.     Interactions:    Active Appropriate and Attentive  Attention:   within normal limits and attention span and concentration were  age appropriate  Memory:   within normal limits; recent and remote memory intact  Visuo-spatial:  within normal limits  Speech (Volume):  normal  Speech:   normal;   Thought Process:  Coherent and Relevant  Though Content:  WNL; not suicidal and not homicidal  Orientation:   person, place, time/date and  situation  Judgment:   Good  Planning:   Good  Affect:    Anxious  Mood:    Anxious  Insight:   Good  Intelligence:   normal  Marital Status/Living: The patient is married and reports and good and supportive relationship.  Medical History:   Past Medical History:  Diagnosis Date  . A-fib ([place])    on pradaxa  . Chronic kidney disease   . Dysrhythmia   . Hypertension   . Hypothyroidism   . Thyroid disease        Psychiatric History:  Patient reports developing depression in 01/31/2014 after mother died.  She was started on Zoloft after extended period of bereavement.  Her symptoms improved and she stopped taking Zoloft.  However, her symptoms returned and she restarted and continues taking this medication.     Family Med/Psych History:  Family History  Problem Relation Age of Onset  . AAA (abdominal aortic aneurysm) Mother     Risk of Suicide/Violence: virtually non-existent   Impression/DX:  [name] is a 66 year old female admitted 04/08/2017 due to BLE cellulitis with difficulty walking.  She was found to be septic and hypotensive.  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  The patient reports that she is coping fairly well given the medical situation.  She agreed to keep staff informed if depressive or anxiety type symptoms develop or worsen.   Diagnosis:    Depression and anxiety by history        Electronically Signed   _______________________ Ilean Skill, Psy.D.

## 2017-05-09 NOTE — Care Management Note (Signed)
[facility] Individual Statement of Services  Patient Name:  [name]  Date:  05/09/2017  Welcome to the [facility].  Our goal is to provide you with an individualized program based on your diagnosis and situation, designed to meet your specific needs.  With this comprehensive rehabilitation program, you will be expected to participate in at least 3 hours of rehabilitation therapies Monday-Friday, with modified therapy programming on the weekends.  Your rehabilitation program will include the following services:  Physical Therapy (PT), Occupational Therapy (OT), 24 hour per day rehabilitation nursing, Therapeutic Recreaction (TR), Neuropsychology, Case Management (Social Worker), Rehabilitation Medicine, Nutrition Services and Pharmacy Services  Weekly team conferences will be held on Wednesdays to discuss your progress.  Your Social Worker will talk with you frequently to get your input and to update you on team discussions.  Team conferences with you and your family in attendance may also be held.  Expected length of stay: 21-24 days     Overall anticipated outcome: minimal assist wheelchair  Depending on your progress and recovery, your program may change. Your Social Worker will coordinate services and will keep you informed of any changes. Your Social Worker's name and contact numbers are listed  below.  The following services may also be recommended but are not provided by the [facility] will be made to provide these services after discharge if needed.  Arrangements include referral to agencies that provide these services.  Your insurance has been verified to be:  [employer] Medicare Your primary doctor is:  [facility]  Pertinent information will be shared with your doctor and your insurance company.  Social Worker:   [place], [place] or ([phone]   Information discussed with and copy given to patient by: Lennart Pall, 05/09/2017, 4:46 PM

## 2017-05-09 NOTE — Evaluation (Signed)
Recreational Therapy Assessment and Plan  Patient Details  Name: [name] MRN: [id number] Date of Birth: 04/07/1951 Today's Date: 05/09/2017  Rehab Potential: Good ELOS: 3 weeks   Assessment  Problem List:      Patient Active Problem List   Diagnosis Date Noted  . Skin ulcer of right heel ([place])   . PAF (paroxysmal atrial fibrillation) ([place])   . Hypotension   . Leukocytosis   . Anemia of chronic disease   . Anxiety state   . Depression   . Prediabetes   . Stage 5 chronic kidney disease not on chronic dialysis ([place])   . 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 Pressure injury of skin 04/19/2017  . Acute renal failure (ARF) ([place])   . Cellulitis of lower extremity   . Edema extremities   . Acute respiratory failure ([place])   . Acute renal failure ([place])   . Multi-organ failure with heart failure ([place])   . Palliative care by specialist   . Goals of care, counseling/discussion   . Septic shock ([place]) 04/08/2017    Past Medical History:      Past Medical History:  Diagnosis Date  . A-fib ([place])    on pradaxa  . Chronic kidney disease   . Dysrhythmia   . Hypertension   . Hypothyroidism   . Thyroid disease    Past Surgical History:       Past Surgical History:  Procedure Laterality Date  . DIALYSIS/PERMA CATHETER INSERTION N/A 04/26/2017   Procedure: Dialysis/Perma Catheter Insertion;  Surgeon: Algernon Huxley, MD;  Location: [facility];  Service: Cardiovascular;  Laterality: N/A;  . WRIST ARTHROSCOPY      Assessment & Plan Clinical Impression: Patient is a 66 y.o. year old female with admission to [facility] on 04/08/17 due to BLE cellulitis with difficulty walking. She was found to be septic with leucocytosis and hypotensive requiring fluid bolus and pressors. Per records, she has not had any meds X 3 months and had two week history of DOE, BLE edema with fatigue and weakness. She was started on broad spectrum antibiotics and was noted to  be volume overloaded with right pleural effusion. CRRT initiated and hospital course significant for PEA arrest 7/4, ARDS, anuria requiring IJ placement by vascular surgery as now HD dependent, shocked liver with abnormal LFTs, thrombocytopenia, ongoing issues with hypotension and tachycardia. She developed BUE edema and dopplers done revealing "occlusive deep vein thrombosis in the left cephalic vein extending from the mid humeral region down towards the elbow". Pressors weaned off and ProAmatine added to help with BP support. She continues on amiodarone and Cardizem for HR control and Dr. [name] recommended coumadin or low dose Eliquis due to A fib/stroke prophylaxis. 2 D echo revealed EF 25-30% with mild concentric hypertrophy, moderate AVR, moderate TVR and mild MVR. Serologic work up negative and limited hope for renal recovery per nephrology--IV albumin being used during HD treatment prn. 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   Patient transferred to CIR on 05/07/2017 .    Pt presents with decreased activity tolerance, decreased functional mobility, decreased balance Limiting pt's independence with leisure/community pursuits.   Leisure History/Participation Premorbid leisure interest/current participation: [place] - Vegetable gardening;Nature - Flower gardening;Community - Shopping mall;Community - Grocery store;Community - Travel (Comment) (place at [employer] [place]) Other Leisure Interests: Cooking/Baking Leisure Participation Style: With Family/Friends Awareness of Community Resources: Excellent Psychosocial / Spiritual Stress Management: Good Patient agreeable to Pet  Therapy: No Does patient have pets?: No Social interaction - Mood/Behavior: Cooperative Engineer, drilling for Education?: Yes Recreational Therapy Orientation Orientation -Reviewed with patient: Available activity  resources Strengths/Weaknesses Patient Strengths/Abilities: Willingness to participate;Active premorbidly Patient weaknesses: Physical limitations TR Patient demonstrates impairments in the following area(s): Edema;Endurance;Motor;Safety;Pain  Plan Rec Therapy Plan Is patient appropriate for Therapeutic Recreation?: Yes Rehab Potential: Good Treatment times per week: Min 1 TR session >25 minutes during LOS Estimated Length of Stay: 3 weeks TR Treatment/Interventions: Adaptive equipment instruction;1:1 session;Balance/vestibular training;Functional mobility training;Community reintegration;Group participation (Comment);Patient/family education;Therapeutic activities;Recreation/leisure participation;Therapeutic exercise;UE/LE Coordination activities;Wheelchair propulsion/positioning Recommendations for other services: Neuropsych  Recommendations for other services: None   Discharge Criteria: Patient will be discharged from TR if patient refuses treatment 3 consecutive times without medical reason.  If treatment goals not met, if there is a change in medical status, if patient makes no progress towards goals or if patient is discharged from hospital.  The above assessment, treatment plan, treatment alternatives and goals were discussed and mutually agreed upon: by patient  [place] 05/09/2017, 2:26 PM

## 2017-05-09 NOTE — Progress Notes (Signed)
Physical Therapy Session Note  Patient Details  Name: [name] MRN: [id number] Date of Birth: 1951/02/09  Today's Date: 05/09/2017 PT Individual Time: 1130-1200 PT Individual Time Calculation (min): 30 min   Short Term Goals: Week 1:  PT Short Term Goal 1 (Week 1): pt will perform functional transfers wtih max A PT Short Term Goal 2 (Week 1): pt will perform sit to stand with max A  Skilled Therapeutic Interventions/Progress Updates:    Pt with no c/o pain.  Session focus on activity tolerance and education regarding pressure relief and w/c positioning.    Pt taken to therapy gym for time management.  Kinetron x10 minutes at 90 cm/s for LE strengthening (w/c level) with rest breaks as needed.  PT attempted to find pressure relieving cushion for pt but none available.  Educated pt on importance of pressure relief with current decrease in mobility and pt verbalized understanding.    Returned to room and left in w/c , call bell in reach and needs met.   Therapy Documentation Precautions:  Precautions Precautions: Fall Restrictions Weight Bearing Restrictions: No   See Function Navigator for Current Functional Status.   Therapy/Group: Individual Therapy  [name] 05/09/2017, 12:10 PM

## 2017-05-10 ENCOUNTER — Inpatient Hospital Stay (HOSPITAL_COMMUNITY): Payer: Medicare Other | Admitting: Occupational Therapy

## 2017-05-10 ENCOUNTER — Inpatient Hospital Stay (HOSPITAL_COMMUNITY): Payer: Medicare Other | Admitting: Physical Therapy

## 2017-05-10 DIAGNOSIS — F331 Major depressive disorder, recurrent, moderate: Secondary | ICD-10-CM

## 2017-05-10 LAB — GLUCOSE, CAPILLARY
GLUCOSE-CAPILLARY: 59 mg/dL — AB (ref 65–99)
Glucose-Capillary: 79 mg/dL (ref 65–99)
Glucose-Capillary: 88 mg/dL (ref 65–99)
Glucose-Capillary: 79 mg/dL (ref 65–99)

## 2017-05-10 LAB — RENAL FUNCTION PANEL
ANION GAP: 16 — AB (ref 5–15)
Albumin: 3.3 g/dL — ABNORMAL LOW (ref 3.5–5.0)
BUN: 98 mg/dL — AB (ref 6–20)
CHLORIDE: 96 mmol/L — AB (ref 101–111)
CO2: 27 mmol/L (ref 22–32)
Calcium: 8.9 mg/dL (ref 8.9–10.3)
Creatinine, Ser: 4.91 mg/dL — ABNORMAL HIGH (ref 0.44–1.00)
GFR, EST AFRICAN AMERICAN: 10 mL/min — AB (ref >60)
GFR, EST NON AFRICAN AMERICAN: 8 mL/min — AB (ref >60)
Glucose, Bld: 72 mg/dL (ref 65–99)
POTASSIUM: 4.6 mmol/L (ref 3.5–5.1)
Phosphorus: 4.9 mg/dL — ABNORMAL HIGH (ref 2.5–4.6)
Sodium: 139 mmol/L (ref 135–145)

## 2017-05-10 LAB — CBC
HCT: 27.9 % — ABNORMAL LOW (ref 36.0–46.0)
HEMOGLOBIN: 8.9 g/dL — AB (ref 12.0–15.0)
MCH: 31.8 pg (ref 26.0–34.0)
MCHC: 31.9 g/dL (ref 30.0–36.0)
MCV: 99.6 fL (ref 78.0–100.0)
Platelets: 292 10*3/uL (ref 150–400)
RBC: 2.8 MIL/uL — AB (ref 3.87–5.11)
RDW: 23.2 % — ABNORMAL HIGH (ref 11.5–15.5)
WBC: 13.4 10*3/uL — ABNORMAL HIGH (ref 4.0–10.5)

## 2017-05-10 LAB — IRON AND TIBC
Iron: 43 ug/dL (ref 28–170)
Saturation Ratios: 16 % (ref 10.4–31.8)
TIBC: 262 ug/dL (ref 250–450)
UIBC: 219 ug/dL

## 2017-05-10 MED ORDER — HEPARIN SODIUM (PORCINE) 1000 UNIT/ML DIALYSIS
20.0000 [IU]/kg | INTRAMUSCULAR | Status: DC | PRN
  Filled 2017-05-10: qty 2

## 2017-05-10 MED ORDER — SODIUM CHLORIDE 0.9 % IV SOLN: 100.0000 mL | INTRAVENOUS | Status: DC | PRN

## 2017-05-10 MED ORDER — HEPARIN SODIUM (PORCINE) 1000 UNIT/ML DIALYSIS
1000.0000 [IU] | INTRAMUSCULAR | Status: DC | PRN
  Filled 2017-05-10: qty 1

## 2017-05-10 MED ORDER — PENTAFLUOROPROP-TETRAFLUOROETH EX AERO: 1.0000 "application " | INHALATION_SPRAY | CUTANEOUS | Status: DC | PRN

## 2017-05-10 MED ORDER — ALTEPLASE 2 MG IJ SOLR: 2.0000 mg | Freq: Once | INTRAMUSCULAR | Status: DC | PRN

## 2017-05-10 MED ORDER — LIDOCAINE-PRILOCAINE 2.5-2.5 % EX CREA
1.0000 "application " | TOPICAL_CREAM | CUTANEOUS | Status: DC | PRN
  Filled 2017-05-10: qty 5

## 2017-05-10 MED ORDER — LIDOCAINE HCL (PF) 1 % IJ SOLN
5.0000 mL | INTRAMUSCULAR | Status: DC | PRN
  Filled 2017-05-10: qty 5

## 2017-05-10 NOTE — Discharge Instructions (Addendum)
Inpatient Rehab Discharge Instructions  [name] Discharge date and time:    Activities/Precautions/ Functional Status: Activity: no lifting, driving, or strenuous exercise  till cleared by MD Diet: renal diet Wound Care: keep wound clean and dry. Contact MD if you develop any problems with your incision/wound--redness, swelling, increase in pain, drainage or if you develop fever or chills.    Functional status:  ___ No restrictions     ___ Walk up steps independently ___ 24/7 supervision/assistance   ___ Walk up steps with assistance ___ Intermittent supervision/assistance  ___ Bathe/dress independently ___ Walk with walker     ___ Bathe/dress with assistance ___ Walk Independently    ___ Shower independently ___ Walk with assistance    ___ Shower with assistance ___ No alcohol     ___ Return to work/school ________  Special Instructions:    My questions have been answered and I understand these instructions. I will adhere to these goals and the provided educational materials after my discharge from the hospital.  Patient/Caregiver Signature _______________________________ Date __________  Clinician Signature _______________________________________ Date __________  Please bring this form and your medication list with you to all your follow-up doctor's appointments.    Information on my medicine - Coumadin   (Warfarin)  This medication education was reviewed with me or my healthcare representative as part of my discharge preparation.   Why was Coumadin prescribed for you? Coumadin was prescribed for you because you have a blood clot or a medical condition that can cause an increased risk of forming blood clots. Blood clots can cause serious health problems by blocking the flow of blood to the heart, lung, or brain. Coumadin can prevent harmful blood clots from forming. As a reminder your indication for Coumadin is:   Stroke Prevention Because Of Atrial  Fibrillation  What test will check on my response to Coumadin? While on Coumadin (warfarin) you will need to have an INR test regularly to ensure that your dose is keeping you in the desired range. The INR (international normalized ratio) number is calculated from the result of the laboratory test called prothrombin time (PT).  If an INR APPOINTMENT HAS NOT ALREADY BEEN MADE FOR YOU please schedule an appointment to have this lab work done by your health care provider within 7 days. Your INR goal is usually a number between:  2 to 3 or your provider may give you a more narrow range like 2-2.5.  Ask your health care provider during an office visit what your goal INR is.  What  do you need to  know  About  COUMADIN? Take Coumadin (warfarin) exactly as prescribed by your healthcare provider about the same time each day.  DO NOT stop taking without talking to the doctor who prescribed the medication.  Stopping without other blood clot prevention medication to take the place of Coumadin may increase your risk of developing a new clot or stroke.  Get refills before you run out.  What do you do if you miss a dose? If you miss a dose, take it as soon as you remember on the same day then continue your regularly scheduled regimen the next day.  Do not take two doses of Coumadin at the same time.  Important Safety Information A possible side effect of Coumadin (Warfarin) is an increased risk of bleeding. You should call your healthcare provider right away if you experience any of the following: ? Bleeding from an injury or your nose that does not stop. ?  Unusual colored urine (red or dark brown) or unusual colored stools (red or black). ? Unusual bruising for unknown reasons. ? A serious fall or if you hit your head (even if there is no bleeding).  Some foods or medicines interact with Coumadin (warfarin) and might alter your response to warfarin. To help avoid this: ? Eat a balanced diet, maintaining a  consistent amount of Vitamin K. ? Notify your provider about major diet changes you plan to make. ? Avoid alcohol or limit your intake to 1 drink for women and 2 drinks for men per day. (1 drink is 5 oz. wine, 12 oz. beer, or 1.5 oz. liquor.)  Make sure that ANY health care provider who prescribes medication for you knows that you are taking Coumadin (warfarin).  Also make sure the healthcare provider who is monitoring your Coumadin knows when you have started a new medication including herbals and non-prescription products.  Coumadin (Warfarin)  Major Drug Interactions  Increased Warfarin Effect Decreased Warfarin Effect  Alcohol (large quantities) Antibiotics (esp. Septra/Bactrim, Flagyl, Cipro) Amiodarone (Cordarone) Aspirin (ASA) Cimetidine (Tagamet) Megestrol (Megace) NSAIDs (ibuprofen, naproxen, etc.) Piroxicam (Feldene) Propafenone (Rythmol SR) Propranolol (Inderal) Isoniazid (INH) Posaconazole (Noxafil) Barbiturates (Phenobarbital) Carbamazepine (Tegretol) Chlordiazepoxide (Librium) Cholestyramine (Questran) Griseofulvin Oral Contraceptives Rifampin Sucralfate (Carafate) Vitamin K   Coumadin (Warfarin) Major Herbal Interactions  Increased Warfarin Effect Decreased Warfarin Effect  Garlic Ginseng Ginkgo biloba Coenzyme Q10 Green tea St. Johns wort    Coumadin (Warfarin) FOOD Interactions  Eat a consistent number of servings per week of foods HIGH in Vitamin K (1 serving =  cup)  Collards (cooked, or boiled & drained) Kale (cooked, or boiled & drained) Mustard greens (cooked, or boiled & drained) Parsley *serving size only =  cup Spinach (cooked, or boiled & drained) Swiss chard (cooked, or boiled & drained) Turnip greens (cooked, or boiled & drained)  Eat a consistent number of servings per week of foods MEDIUM-HIGH in Vitamin K (1 serving = 1 cup)  Asparagus (cooked, or boiled & drained) Broccoli (cooked, boiled & drained, or raw & chopped) Brussel  sprouts (cooked, or boiled & drained) *serving size only =  cup Lettuce, raw (green leaf, endive, romaine) Spinach, raw Turnip greens, raw & chopped   These websites have more information on Coumadin (warfarin):  FailFactory.se; VeganReport.com.au;

## 2017-05-10 NOTE — Progress Notes (Signed)
HD tx completed @ 2048 w/o problem, UF goal met, blood rinsed back, VSS, attempted to call report to primary nurse but unable to reach her at this time, will try back

## 2017-05-10 NOTE — Progress Notes (Signed)
Admit Date: 05/07/2017 LOS: 3 days   [name] is a 66 y/o F with CKD IIIa, HTN, hypothyroidism, HFrEF (LVEF 25-30%) and AFib on Pradaxa admitted 7/1 to [facility] with septic and cardiogenic shock with MODS (2/2 LE cellulitis), including anuric AKI. Has been HD dependent for 1 month. Received Vanc/zosyn & contrast exposure. Initially on CRRT however transitioned to [facility] MWF. Her hospital course was also complicated by PEA arrest, LUE DVT and persistent hypotension. She is currently in physical rehab following her prolonged hospitalization.   Subjective:  Feels well today. No CP, SOB, confusion, or nausea/vomiting. Appetite still good. No further sensation of urination and no further dampness. Working with PT/OT. Unable to get HD yesterday, will have today.    Intake/Output Summary (Last 24 hours) at 05/10/17 1143 Last data filed at 05/10/17 0824  Gross per 24 hour  Intake              380 ml  Output                0 ml  Net              380 ml   Filed Weights   05/08/17 0500 05/09/17 0514 05/10/17 0427  Weight: 171 lb (77.6 kg) 169 lb (76.7 kg) 170 lb 1.6 oz (77.2 kg)   Current IP Medications . amiodarone  200 mg Oral BID  . apixaban  2.5 mg Oral BID  . collagenase   Topical Daily  . darbepoetin (ARANESP) injection - DIALYSIS  60 mcg Intravenous Q Wed-HD  . diltiazem  120 mg Oral Daily  . famotidine  20 mg Oral QHS  . feeding supplement (NEPRO CARB STEADY)  237 mL Oral Q1500  . feeding supplement (PRO-STAT SUGAR FREE 64)  30 mL Oral BID  . insulin aspart  0-15 Units Subcutaneous TID WC  . insulin aspart  0-5 Units Subcutaneous QHS  . levothyroxine  100 mcg Oral QAC breakfast  . midodrine  5 mg Oral TID WC  . multivitamin  1 tablet Oral QHS  . QUEtiapine  25 mg Oral QHS  . sertraline  25 mg Oral QHS  . sevelamer carbonate  1,600 mg Oral TID WC  Continuous Infusion Medications: None  PRN Medications:  acetaminophen, aluminum hydroxide, artificial tears, bisacodyl,  diphenhydrAMINE, guaiFENesin-dextromethorphan, ipratropium-albuterol, ondansetron (ZOFRAN) IV, phenol, polyethylene glycol, prochlorperazine **OR** prochlorperazine **OR** prochlorperazine, zinc oxide  Current Labs: Reviewed.  BMP Latest Ref Rng & Units 05/09/2017 05/07/2017 05/04/2017  Glucose 65 - 99 mg/dL 79 80 154(H)  BUN 6 - 20 mg/dL 74(H) 81(H) 55(H)  Creatinine 0.44 - 1.00 mg/dL 3.97(H) 4.47(H) 2.86(H)  Sodium 135 - 145 mmol/L 140 140 140  Potassium 3.5 - 5.1 mmol/L 5.4(H) 4.8 3.8  Chloride 101 - 111 mmol/L 98(L) 100(L) 99(L)  CO2 22 - 32 mmol/L 24 27 28   Calcium 8.9 - 10.3 mg/dL 9.2 9.0 8.6(L)  Phos: 4.8 (previously 6.9)  CBC CBC Latest Ref Rng & Units 05/07/2017 05/04/2017 05/02/2017  WBC 3.6 - 11.0 K/uL 13.5(H) 7.8 10.7  Hemoglobin 12.0 - 16.0 g/dL 9.1(L) 8.6(L) 8.9(L)  Hematocrit 35.0 - 47.0 % 27.2(L) 26.0(L) 26.8(L)  Platelets 150 - 440 K/uL 288 184 140(L)   Physical Exam  Blood pressure 117/68, pulse 95, temperature 98.1 F (36.7 C), temperature source Oral, resp. rate 18, height 5\' 5"  (1.651 m), weight 170 lb 1.6 oz (77.2 kg), SpO2 98 %. GEN: Alert and oriented, pleasant and conversant CV: RRR, diastolic murmur PULM: CTAB ABD: Soft,  nontender  SKIN: No rash. LLE wrapped in clean dry bandanges EXT: Min-no LE edema.  Assessment [name] is a 66yo female recently admitted to [facility] with septic and cardiogenic shock with multiorgan failure 2/2 cellulitis including anuric AKI for which she was started on CRRT and transitioned to intermittent HD (MWF). Hospital course complicated by PEA arrest, UE DVT; she received vanc/zosyn and had contrast exposure. She is now in physical rehabilitation following her month long hospitalization. 1. ESRD- iHD MWF via RIJ permacath, was previously at CKD stage 3. She remains anuric following septic and cardiogenic shock requiring pressor support, contrast exposure and exposure to Vanc/zosyn however some question of urination yesterday.  Her  schedule here is MWF, a OP has been set up for TTS w/ Dr. Holley Raring at [facility]. Serologies have been negative.  2. Hypotension - On Midodrine for continued pressure support. BP stable. 3. Anemia - On aranesp qwed 4. Hyperphosphatemia - Continue Renvela 5. A fib - on diltiazem, amiodarone; eliquis 6. Hypothyroidism - on levothyroxine  Plan 1. HD today. Pt to have HD today and Sat, likely transition back to MWF schedule. Patient is hoping to avoid Saturday HD sessions, will work on outpatient schedule.  2. Will need access placement towards end of rehab stay - dont want to interfere with her rehab progress 3. Monitor for UOP closely. Wonder if patient could be making urine? 4. Continue Midodrine for pressure support. 5. Aranesp every Wednesday with HD sessions, Iron studies pending - Please collect studies during HD. 6. Renal panel pend 7. PTH pending, on Renvela 8. Daily weights, strict ins/outs, fluid restriction 1239ml  Einar Gip, DO - PGY2 Pager: [phone] 05/10/2017, 8:44 AM   Patient seen and examined, agree with above note with above modifications. Did not get dialysis yesterday evening due to heavy schedule. She is on the schedule for dialysis later this afternoon. This may be fortuitous as we would need to change her to a TTS schedule prior to discharge. She is not thrilled at the prospect of Saturday dialysis. We will need to do it at least this Saturday and then go from there. She is not continue to make much urine output. Her labs are abnormal. We'll continue with routine dialysis but with continued observation to look for any renal recovery. Iron stores and PTH to be checked with dialysis and affect as needed, already on ESA and Moshe Salisbury, MD 05/10/2017

## 2017-05-10 NOTE — Progress Notes (Signed)
Occupational Therapy Session Note  Patient Details  Name: [name] MRN: [id number] Date of Birth: 01/12/1951  Today's Date: 05/10/2017 OT Individual Time: 1105-1200 and 1330-1410 OT Individual Time Calculation (min): 55 min and 40 min   Short Term Goals: Week 1:  OT Short Term Goal 1 (Week 1): Pt will complete sit > stand in [place] with one person assist to decrease burden of care OT Short Term Goal 2 (Week 1): Pt will complete slide board transfer with max assist of one caregiver OT Short Term Goal 3 (Week 1): Pt will complete don pants with max assist at bed level  Skilled Therapeutic Interventions/Progress Updates:    1) Treatment session with focus on trunk control and lateral leans for LB dressing and transfers.  Completed slide board transfer to therapy mat total assist +2.  Engaged in lateral leans with focus on weight shifting and lifting buttocks to increase participation in transfers and LB dressing.  Incorporated reaching to increase weight shift and challenge UE strengthening.  Therapist seated in front of pt in arm chair to promote anterior weight shift as needed for transfers and sit > stand.  Pt continuing to require total to +2 assist with mini scoots.  Engaged in partial sit > stand with 3 musketeers to promote weight shift and increase confidence with weight shift as needed for sit > stand.  Returned to w/c via slide board with pt initiating transfer and managing 2-3 scoots with min assist (downhill), however ultimately requiring +2 due to decreased weight shift as incline leveled out and pt losing balance forward.    2) Treatment session with focus on sit > stand and standing tolerance.  Engaged in standing in standing frame 4 min x4 with focus on weight bearing through BLE and standing tolerance while engaging in table top task to promote increased UE ROM and weight shifting.  Utilized Stedy with +2 for sit > stand to return to bed.  Max assist bed mobility to return to  semi-reclined in bed.  RN notified of return and ready for HD.  Therapy Documentation Precautions:  Precautions Precautions: Fall Restrictions Weight Bearing Restrictions: No Pain: Pain Assessment Pain Assessment: No/denies pain Pain Score: 0-No pain  See Function Navigator for Current Functional Status.   Therapy/Group: Individual Therapy  [name] 05/10/2017, 12:25 PM

## 2017-05-10 NOTE — Progress Notes (Signed)
Occupational Therapy Session Note  Patient Details  Name: [name] MRN: [id number] Date of Birth: May 01, 1951  Today's Date: 05/10/2017 OT Individual Time: 4098-1191 OT Individual Time Calculation (min): 45 min    Short Term Goals: Week 1:  OT Short Term Goal 1 (Week 1): Pt will complete sit > stand in [place] with one person assist to decrease burden of care OT Short Term Goal 2 (Week 1): Pt will complete slide board transfer with max assist of one caregiver OT Short Term Goal 3 (Week 1): Pt will complete don pants with max assist at bed level  Skilled Therapeutic Interventions/Progress Updates:    1:1 Self care retraining at bed level. Focus on rolling with ability to verbalize to caregiver how to assist with setting up her body and achieving full sidelying positioning. Education on pressure relief for two wounds on bottom. Once therapist assisted with threading pants pt able to perform pull them up over hips in sidelying with extra time.  Pt came to EOB with max A with extra time and max VC for sequencing and body mechanics. Pt bathed and dressign UB sitting EOB with steadying A to maintain dynamic sitting balance. Pt perform slide board transfer bed to w/c with max A with focus on maintaining head/ hip relationship and forward weight shift. Pt performed grooming at sink with setup.  Elevating LE rest added to w/c to promote decr LB swelling.   Left up in w/c with call bell.   Therapy Documentation Precautions:  Precautions Precautions: Fall Restrictions Weight Bearing Restrictions: No Pain: no c/o pain in session  See Function Navigator for Current Functional Status.   Therapy/Group: Individual Therapy  [facility] 05/10/2017, 2:54 PM

## 2017-05-10 NOTE — Progress Notes (Signed)
[place] PHYSICAL MEDICINE & REHABILITATION     PROGRESS NOTE  Subjective/Complaints:  Pt seen laying in bed this AM.  She slept well overnight.  She believes therapies are going very well.    ROS: Denies CP, SOB, N/V/D.  Objective: Vital Signs: Blood pressure 117/68, pulse 95, temperature 98.1 F (36.7 C), temperature source Oral, resp. rate 18, height 5\' 5"  (1.651 m), weight 77.2 kg (170 lb 1.6 oz), SpO2 98 %. No results found.  Recent Labs  05/07/17 1127  WBC 13.5*  HGB 9.1*  HCT 27.2*  PLT 288    Recent Labs  05/07/17 1127 05/09/17 1042  NA 140 140  K 4.8 5.4*  CL 100* 98*  GLUCOSE 80 79  BUN 81* 74*  CREATININE 4.47* 3.97*  CALCIUM 9.0 9.2   CBG (last 3)   Recent Labs  05/09/17 1648 05/09/17 2128 05/10/17 0644  GLUCAP 91 87 79    Wt Readings from Last 3 Encounters:  05/10/17 77.2 kg (170 lb 1.6 oz)  05/07/17 75.3 kg (166 lb 0.1 oz)    Physical Exam:  BP 117/68 (BP Location: Right Arm)   Pulse 95   Temp 98.1 F (36.7 C) (Oral)   Resp 18   Ht 5\' 5"  (1.651 m)   Wt 77.2 kg (170 lb 1.6 oz)   SpO2 98%   BMI 28.31 kg/m  Constitutional: She appears well-developed and well-nourished.  HENT: Normocephalic. Atraumatic. Eyes: EOMI. No discharge.  Cardiovascular: IRIR. Murmur heard. Respiratory: Effort normal. No stridor.  GI: Soft. Bowel sounds are normal.   Musculoskeletal: She exhibits edema and tenderness.  Neurological: She is alert and oriented.  Able to follow commands without difficulty.  Motor: B/l UE 4/5 proximal to distal B/l LE; HF 2/5, KE 2+/5, ADF/PF 3/5 (unchanged) Skin: LLE with ulcerations with eschar on dorsum of foot and distal tibia with dressings. Psychiatric: She has a normal mood and affect. Her behavior is normal. Thought content normal.    Assessment/Plan: 1. 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 Physiatrist is  providing close team supervision and 24 hour management of active medical problems listed below. Physiatrist and rehab team continue to assess barriers to discharge/monitor patient progress toward functional and medical goals.  Function:  Bathing Bathing position   Position: Bed (bed for LB and sitting in Stedy for UB)  Bathing parts Body parts bathed by patient: Right arm, Chest, Abdomen Body parts bathed by helper: Buttocks, Right upper leg, Left upper leg, Left arm, Right lower leg, Left lower leg, Back, Front perineal area  Bathing assist Assist Level:  (Max assist)      Upper Body Dressing/Undressing Upper body dressing   What is the patient wearing?: Pull over shirt/dress     Pull over shirt/dress - Perfomed by patient: Thread/unthread right sleeve, Thread/unthread left sleeve, Put head through opening Pull over shirt/dress - Perfomed by helper: Pull shirt over trunk        Upper body assist Assist Level: Touching or steadying assistance(Pt > 75%)      Lower Body Dressing/Undressing Lower body dressing   What is the patient wearing?: Pants, Non-skid slipper socks       Pants- Performed by helper: Thread/unthread right pants leg, Thread/unthread left pants leg, Pull pants up/down   Non-skid slipper socks- Performed by helper: Don/doff right sock, Don/doff left sock  Lower body assist Assist for lower body dressing: 2 Helpers      Toileting Toileting Toileting activity did not occur: No continent bowel/bladder event        Toileting assist     Transfers Chair/bed transfer     Chair/bed transfer assist level: 2 helpers Chair/bed transfer assistive device: Mechanical lift Mechanical lift: Ecologist Ambulation activity did not occur: Safety/medical concerns         Wheelchair   Type: Manual Max wheelchair distance: 10' Assist Level: Touching or steadying assistance (Pt > 75%)  Cognition Comprehension  Comprehension assist level: Follows basic conversation/direction with no assist  Expression Expression assist level: Expresses basic needs/ideas: With no assist  Social Interaction Social Interaction assist level: Interacts appropriately with others - No medications needed.  Problem Solving Problem solving assist level: Solves basic 90% of the time/requires cueing < 10% of the time  Memory Memory assist level: More than reasonable amount of time    Medical Problem List and Plan: 1.  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  DVT Prophylaxis/Anticoagulation: Pharmaceutical: Other (comment)--Eliquis 3. Pain Management: tylenol prn 4. Mood: LCSW to follow for evaluation and support.  5. Neuropsych: This patient is capable of making decisions on her own behalf. 6. Skin/Wound Care: routine pressure relief measures. Prevalon boot for heel ulcer.  7. Fluids/Electrolytes/Nutrition: Strict I/Os. Monitor daily weights.   8.  Hypotension: Monitor orthostatic BP. Added abdominal binder and TEDs to help with BP support.   Controlled 8/2 9. Afib: Monitor HR bid--on eliquis for stroke prevention. Continue amiodarone bid and Cardizem daily.  10. Leucocytosis:              Patient with ulcers--appreciate WOC recs             Afebrile             WBCs 13.5 on 7/30  Labs ordered for tomorrow             Cont to monitor 11. Anemia of chronic disease: On epogen  MWF.              Hb 9.1 on 7/30  Labs ordered for tomorrow             Cont to montior 12. Left foot ulcer: Daily dressing changes with santyl. Monitor for signs of infection.  13. Anxiety/depression: On Seroquel and Zoloft at nights.  14. Prediabetes: Will consult dietitian to educate patein ton diet.  Hgb A1c- 6.2. Monitor BS ac/hs. Use SSI for elevated BS  Controlled 8/2 15. CKD now HD dependent: HD on MWF at end of day to help with tolerance of therapy. Renal diet--Reducate patient on appropriate  diet.    LOS (Days) 3 A FACE TO FACE EVALUATION WAS PERFORMED  Ankit Lorie Phenix 05/10/2017 9:58 AM

## 2017-05-10 NOTE — Progress Notes (Signed)
Physical Therapy Note  Patient Details  Name: [name] MRN: [id number] Date of Birth: 21-May-1951 Today's Date: 05/10/2017    Time: 531 070 5388 55 minutes  1:1 no c/o pain.  Sliding board transfers to level surfaces throughotu session with max A, pt better able to use UEs for transfers today.  Sit to stand with stedy x 2 with +2 assist for trunk and LEs.  Sit to stand reps from seated in stedy x 10 throughout session with max-total A, limited by LE and trunk fatigue.  Seated in stedy reaching activity with bilat UEs unable to reach > 90 degrees, pt requires min A for reaching out of BOS in stedy.  Seated LE and UE AAROM for strengthening. Pt fatigues quickly and requires encouragement due to anxiety.    Caldwell Kronenberger 05/10/2017, 10:27 AM

## 2017-05-11 ENCOUNTER — Inpatient Hospital Stay (HOSPITAL_COMMUNITY): Payer: Medicare Other

## 2017-05-11 ENCOUNTER — Inpatient Hospital Stay (HOSPITAL_COMMUNITY): Payer: Medicare Other | Admitting: Occupational Therapy

## 2017-05-11 ENCOUNTER — Inpatient Hospital Stay (HOSPITAL_COMMUNITY): Payer: Medicare Other | Admitting: Physical Therapy

## 2017-05-11 LAB — CBC WITH DIFFERENTIAL/PLATELET
BASOS ABS: 0 10*3/uL (ref 0.0–0.1)
Basophils Relative: 0 %
EOS ABS: 0.3 10*3/uL (ref 0.0–0.7)
Eosinophils Relative: 3 %
HCT: 29.1 % — ABNORMAL LOW (ref 36.0–46.0)
Hemoglobin: 9.2 g/dL — ABNORMAL LOW (ref 12.0–15.0)
LYMPHS ABS: 1.3 10*3/uL (ref 0.7–4.0)
LYMPHS PCT: 12 %
MCH: 31.9 pg (ref 26.0–34.0)
MCHC: 31.6 g/dL (ref 30.0–36.0)
MCV: 101 fL — ABNORMAL HIGH (ref 78.0–100.0)
MONO ABS: 0.9 10*3/uL (ref 0.1–1.0)
Monocytes Relative: 8 %
NEUTROS ABS: 8.4 10*3/uL — AB (ref 1.7–7.7)
Neutrophils Relative %: 77 %
PLATELETS: 280 10*3/uL (ref 150–400)
RBC: 2.88 MIL/uL — ABNORMAL LOW (ref 3.87–5.11)
RDW: 23.5 % — AB (ref 11.5–15.5)
WBC: 10.9 10*3/uL — ABNORMAL HIGH (ref 4.0–10.5)

## 2017-05-11 LAB — BASIC METABOLIC PANEL
Anion gap: 9 (ref 5–15)
BUN: 20 mg/dL (ref 6–20)
CALCIUM: 8.2 mg/dL — AB (ref 8.9–10.3)
CO2: 28 mmol/L (ref 22–32)
CREATININE: 1.8 mg/dL — AB (ref 0.44–1.00)
Chloride: 99 mmol/L — ABNORMAL LOW (ref 101–111)
GFR calc non Af Amer: 28 mL/min — ABNORMAL LOW (ref >60)
GFR, EST AFRICAN AMERICAN: 33 mL/min — AB (ref >60)
Glucose, Bld: 82 mg/dL (ref 65–99)
Potassium: 4.1 mmol/L (ref 3.5–5.1)
SODIUM: 136 mmol/L (ref 135–145)

## 2017-05-11 LAB — HEPATITIS B SURFACE ANTIGEN: HEP B S AG: NEGATIVE

## 2017-05-11 LAB — GLUCOSE, CAPILLARY
GLUCOSE-CAPILLARY: 81 mg/dL (ref 65–99)
GLUCOSE-CAPILLARY: 91 mg/dL (ref 65–99)
Glucose-Capillary: 77 mg/dL (ref 65–99)
Glucose-Capillary: 85 mg/dL (ref 65–99)

## 2017-05-11 LAB — PARATHYROID HORMONE, INTACT (NO CA): PTH: 130 pg/mL — ABNORMAL HIGH (ref 15–65)

## 2017-05-11 MED ORDER — NA FERRIC GLUC CPLX IN SUCROSE 12.5 MG/ML IV SOLN
250.0000 mg | INTRAVENOUS | Status: AC
  Administered 2017-05-13 – 2017-05-14 (×2): 250 mg via INTRAVENOUS
  Filled 2017-05-11 (×7): qty 20

## 2017-05-11 MED ORDER — DARBEPOETIN ALFA 60 MCG/0.3ML IJ SOSY: 60.0000 ug | PREFILLED_SYRINGE | INTRAMUSCULAR | Status: DC

## 2017-05-11 MED ORDER — SENNOSIDES-DOCUSATE SODIUM 8.6-50 MG PO TABS
2.0000 | ORAL_TABLET | Freq: Every day | ORAL | Status: DC
  Administered 2017-05-11 – 2017-06-02 (×19): 2 via ORAL
  Filled 2017-05-11 (×22): qty 2

## 2017-05-11 NOTE — Progress Notes (Signed)
Physical Therapy Note  Patient Details  Name: Lular Letson MRN: [id number] Date of Birth: 19-Feb-1951 Today's Date: 05/11/2017    Time: 242-683 55 minutes  1:1 No c/o pain.  Supine to sit with mod/max A.  Sliding board transfer with max A.  W/c mobility 100' with min A for steering with bilat UEs.  W/c cushion switched and pt provided with a pressure relieving cushion.  kinetron 2 x 3 minutes with min A for Lt LE strength, Rt LE able to pedal withotu assist. Standing frame 10 min, 6 min with reaching tasks and shoulder AAROM. Pt with difficulty coordination shoulder and back mm for exercises.  Pt requires max/total A for scooting forward/backward in w/c.   Cheyanne Lamison 05/11/2017, 9:25 AM

## 2017-05-11 NOTE — Progress Notes (Signed)
[place] PHYSICAL MEDICINE & REHABILITATION     PROGRESS NOTE  Subjective/Complaints:  Lying in bed eating breakfast. Constipated but otherwise feeling well.   ROS: pt denies nausea, vomiting, diarrhea, cough, shortness of breath or chest pain   Objective: Vital Signs: Blood pressure 109/72, pulse 97, temperature (!) 97.5 F (36.4 C), temperature source Oral, resp. rate 18, height 5\' 5"  (1.651 m), weight 71.8 kg (158 lb 4.6 oz), SpO2 100 %. No results found.  Recent Labs  05/10/17 1711 05/11/17 0512  WBC 13.4* 10.9*  HGB 8.9* 9.2*  HCT 27.9* 29.1*  PLT 292 280    Recent Labs  05/10/17 1710 05/11/17 0512  NA 139 136  K 4.6 4.1  CL 96* 99*  GLUCOSE 72 82  BUN 98* 20  CREATININE 4.91* 1.80*  CALCIUM 8.9 8.2*   CBG (last 3)   Recent Labs  05/10/17 2144 05/10/17 2227 05/11/17 0635  GLUCAP 59* 79 81    Wt Readings from Last 3 Encounters:  05/11/17 71.8 kg (158 lb 4.6 oz)  05/07/17 75.3 kg (166 lb 0.1 oz)    Physical Exam:  BP 109/72 (BP Location: Right Arm)   Pulse 97   Temp (!) 97.5 F (36.4 C) (Oral)   Resp 18   Ht 5\' 5"  (1.651 m)   Wt 71.8 kg (158 lb 4.6 oz)   SpO2 100%   BMI 26.34 kg/m  Constitutional: She appears well-developed and well-nourished.  HENT: Normocephalic. Atraumatic. Eyes: EOMI. No discharge.  Cardiovascular:IRR/IRR---tachy. Respiratory: CTA Bilaterally without wheezes or rales. Normal effort .  GI: Soft. Bowel sounds are normal.   Musculoskeletal: She exhibits edema and tenderness.  Neurological: She is alert and oriented.  Able to follow commands without difficulty.  Motor: B/l UE 4/5 proximal to distal B/l LE; HF 2/5, KE 2+/5, ADF/PF 3/5 (stable) Skin: LLE with ulcerations with eschar on dorsum of foot and distal tibia with dressings in place. Psychiatric: She has a normal mood and affect. Her behavior is normal. Thought content normal.    Assessment/Plan: 1. 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 Physiatrist is providing close team supervision and 24 hour management of active medical problems listed below. Physiatrist and rehab team continue to assess barriers to discharge/monitor patient progress toward functional and medical goals.  Function:  Bathing Bathing position   Position: Bed  Bathing parts Body parts bathed by patient: Right arm, Chest, Abdomen, Left arm Body parts bathed by helper: Front perineal area, Buttocks, Right upper leg, Left upper leg, Right lower leg, Left lower leg, Back  Bathing assist Assist Level: Touching or steadying assistance(Pt > 75%)      Upper Body Dressing/Undressing Upper body dressing   What is the patient wearing?: Pull over shirt/dress     Pull over shirt/dress - Perfomed by patient: Thread/unthread right sleeve, Thread/unthread left sleeve, Put head through opening, Pull shirt over trunk Pull over shirt/dress - Perfomed by helper: Pull shirt over trunk        Upper body assist Assist Level: Touching or steadying assistance(Pt > 75%)      Lower Body Dressing/Undressing Lower body dressing   What is the patient wearing?: Pants, Non-skid slipper socks     Pants- Performed by patient: Pull pants up/down Pants- Performed by helper: Thread/unthread right pants leg, Thread/unthread left pants leg   Non-skid slipper socks- Performed by helper: Don/doff right sock, Don/doff left sock  Lower body assist Assist for lower body dressing: Touching or steadying assistance (Pt > 75%)      Toileting Toileting Toileting activity did not occur: N/A (anuric, no bm, no toileting performed)        Toileting assist     Transfers Chair/bed transfer     Chair/bed transfer assist level: 2 helpers Chair/bed transfer assistive device: Mechanical lift Mechanical lift: Ecologist Ambulation activity did not occur: Safety/medical  concerns         Wheelchair   Type: Manual Max wheelchair distance: 10' Assist Level: Touching or steadying assistance (Pt > 75%)  Cognition Comprehension Comprehension assist level: Follows basic conversation/direction with no assist  Expression Expression assist level: Expresses basic needs/ideas: With no assist  Social Interaction Social Interaction assist level: Interacts appropriately with others - No medications needed.  Problem Solving Problem solving assist level: Solves basic 90% of the time/requires cueing < 10% of the time  Memory Memory assist level: More than reasonable amount of time    Medical Problem List and Plan: 1.  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  DVT Prophylaxis/Anticoagulation: Pharmaceutical: Other (comment)--Eliquis 3. Pain Management: tylenol prn 4. Mood: LCSW to follow for evaluation and support.  5. Neuropsych: This patient is capable of making decisions on her own behalf. 6. Skin/Wound Care: routine pressure relief measures. Prevalon boot for heel ulcer.  7. Fluids/Electrolytes/Nutrition: Strict I/Os. Monitor daily weights.   8.  Hypotension: Monitor orthostatic BP. Added abdominal binder and TEDs to help with BP support.   Controlled 8/3 9. Afib:  -on eliquis for stroke prevention. Continue amiodarone bid and Cardizem daily.   -HR a little elevated---monitor for now 10. Leukocytosis:              Patient with ulcers--continue with WOC recs/daily dressing             Afebrile             WBCs down 10.9 today              Cont to monitor 11. Anemia of chronic disease: On epogen  MWF.              Hb 9.2 today             Cont to montior 12. Left foot ulcer: Daily dressing changes with santyl. Monitor for signs of infection.  13. Anxiety/depression: On Seroquel and Zoloft at nights.  14. Prediabetes: Will consult dietitian to educate pateint on diet.  Hgb A1c- 6.2. Monitor BS ac/hs. Use SSI for  elevated BS  Controlled 8/3 15. CKD now HD dependent: HD on MWF at end of day to help with tolerance of therapy. Renal diet--Reducate patient on appropriate diet.    LOS (Days) 4 A FACE TO FACE EVALUATION WAS PERFORMED  SWARTZ,ZACHARY T 05/11/2017 8:57 AM

## 2017-05-11 NOTE — Patient Care Conference (Signed)
Inpatient RehabilitationTeam Conference and Plan of Care Update Date: 05/09/2017   Time: 2:50 PM    Patient Name: [name]      Medical Record Number: [id number]  Date of Birth: June 07, 1951 Sex: Female         Room/Bed: 4W06C/4W06C-01 Payor Info: Payor: [employer] MEDICARE / Plan: UHC MEDICARE / Product Type: *No Product type* /    Admitting Diagnosis: Critical Illness  Admit Date/Time:  05/07/2017  6:16 PM Admission Comments: No comment available   Primary Diagnosis:  <principal problem not specified> Principal Problem: <principal problem not specified>  Patient Active Problem List   Diagnosis Date Noted  . Skin ulcer of right heel ([place])   . PAF (paroxysmal atrial fibrillation) ([place])   . Hypotension   . Leukocytosis   . Anemia of chronic disease   . Anxiety state   . Depression   . Prediabetes   . Stage 5 chronic kidney disease not on chronic dialysis ([place])   . 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 Pressure injury of skin 04/19/2017  . Acute renal failure (ARF) ([place])   . Cellulitis of lower extremity   . Edema extremities   . Acute respiratory failure ([place])   . Acute renal failure ([place])   . Multi-organ failure with heart failure ([place])   . Palliative care by specialist   . Goals of care, counseling/discussion   . Septic shock ([place]) 04/08/2017    Expected Discharge Date: Expected Discharge Date: 05/31/17  Team Members Present: Physician leading conference: Dr. Delice Lesch Social Worker Present: Lennart Pall, LCSW Nurse Present: Isla Pence, RN PT Present: Leavy Cella, PT OT Present: [name], OT SLP Present: Windell Moulding, SLP PPS Coordinator present : Daiva Nakayama, RN, CRRN     Current Status/Progress Goal Weekly Team Focus  Medical   Deficits in mobility and ability to carry out ADL tasks secondary to CIM  Improve mobility, strength, BP, WBCs, anemia, prediabetes  See above   Bowel/Bladder   Continent of bowel; currently anuric on HD MWF  Mod I  Assess  and treat for constipation as needed   Swallow/Nutrition/ Hydration             ADL's   total +2 assist sit > stand with Stedy, bed level for LB bathing and dressing max-total assist, min assist UB bathing seated at sink  Min assist overall  ADL retraining, BUE strengthening, activity tolerance, transfers, sit > stand   Mobility   total +2 assist sliding board transfers, unable to stand  min A w/c level (gait goal for PT only)  strength, activity tolerance, transfers   Communication             Safety/Cognition/ Behavioral Observations            Pain   Denies  < 3  Assess and treat for pain q shift and prn   Skin   MASD with shearing to bilateral buttocks; left leg wounds with santyl, moist-dry dressings daily; on air mattress  mod assist  Assess skin q shift and prn; perform dressing changes per MD order.    Rehab Goals Patient on target to meet rehab goals: Yes *See Care Plan and progress notes for long and short-term goals.     Barriers to Discharge  Current Status/Progress Possible Resolutions Date Resolved   Physician    Medical stability;Wound Care;Hemodialysis     See above  Therapies, otpmize BP meds, follow labs, follow CBGs, HD recs per Neprho  Nursing  Medication compliance;Hemodialysis               PT  Inaccessible home environment;Wound Care;Hemodialysis  4 stairs to enter home, needs to get to/from dialysis at d/c              OT Hemodialysis                SLP                SW                Discharge Planning/Teaching Needs:  Plan home with spouse who can provide 24/7 assistance.  Teaching prior to d/c    Team Discussion:  Wound care - likely to begin debridement on Monday per MD,  +2 bed level for self-care.  standin stedy with +2.  Limited ROM with shoulders.  +2 slide board tfs.  Goals all set for w/c level  Revisions to Treatment Plan:  None    Continued Need for Acute Rehabilitation Level of Care: The patient requires daily medical  management by a physician with specialized training in physical medicine and rehabilitation for the following conditions: Daily direction of a multidisciplinary physical rehabilitation program to ensure safe treatment while eliciting the highest outcome that is of practical value to the patient.: Yes Daily medical management of patient stability for increased activity during participation in an intensive rehabilitation regime.: Yes Daily analysis of laboratory values and/or radiology reports with any subsequent need for medication adjustment of medical intervention for : Neurological problems;Blood pressure problems;Diabetes problems;Renal problems  [name] 05/11/2017, 3:03 PM

## 2017-05-11 NOTE — Progress Notes (Signed)
Physical Therapy Note  Patient Details  Name: Maliea Grandmaison MRN: [id number] Date of Birth: [birth year] Today's Date: 05/11/2017    Time: 1345-1430 45 minutes  1:1 No c/o pain.  Session focused on scooting to improve sliding board transfers.  Pt performed wt shifts and scooting fwd/backwards/laterally on mat with min-max A, max visual and tactile cues for wt shifts and technique. Pt fatigues easily and requires encouragement due to anxiety with forward wt shifts. Pt improved scooting fwd/bkwd, continues to require max A for lateral scoots.   Seniah Lawrence 05/11/2017, 2:33 PM

## 2017-05-11 NOTE — Progress Notes (Signed)
Social Work Patient ID: [name], female   DOB: [birth year], 66 y.o.   MRN: [id number]   Have reviewed team conference with pt (husband had just left - will follow up with him on Monday).  She is aware of targeted d/c date of 8/23 but concerned it "might be too early".  She reports therapies and HD "have been hard.. I'm tired."  Support provided and will continue to follow.  Sadie Hazelett, LCSW

## 2017-05-11 NOTE — Plan of Care (Signed)
Problem: RH BOWEL ELIMINATION Goal: RH STG MANAGE BOWEL WITH ASSISTANCE STG Manage Bowel with min Assistance.  Outcome: Not Progressing LBM 05-07-17 Goal: RH STG MANAGE BOWEL W/MEDICATION W/ASSISTANCE STG Manage Bowel with Medication with min Assistance.  Outcome: Not Progressing LBM 05-07-17

## 2017-05-11 NOTE — Progress Notes (Signed)
Admit Date: 05/07/2017 LOS: 4 days   Ms. Bily is a 66 y/o F with CKD IIIa, HTN, hypothyroidism, HFrEF (LVEF 25-30%) and AFib on Pradaxa admitted 7/1 to [facility] with septic and cardiogenic shock with MODS (2/2 LE cellulitis), including anuric AKI. Has been HD dependent for 1 month. Received Vanc/zosyn & contrast exposure. Initially on CRRT however transitioned to [facility] MWF. Her hospital course was also complicated by PEA arrest, LUE DVT and persistent hypotension. She is currently in physical rehab following her prolonged hospitalization.   Subjective:  Continues to feel well and is without complaint, other than weakness which seems to be improving. Feels like she needs to urinate however no UOP. No CP, SOB, confusion, N/V.  Tolerated HD well- removed one liter. Continuing to work with PT/OT   Intake/Output Summary (Last 24 hours) at 05/11/17 0904 Last data filed at 05/10/17 2048  Gross per 24 hour  Intake              120 ml  Output             1002 ml  Net             -882 ml   Filed Weights   05/10/17 1645 05/10/17 2055 05/11/17 0426  Weight: 165 lb 5.5 oz (75 kg) 163 lb 2.3 oz (74 kg) 158 lb 4.6 oz (71.8 kg)   Current IP Medications . amiodarone  200 mg Oral BID  . apixaban  2.5 mg Oral BID  . collagenase   Topical Daily  . darbepoetin (ARANESP) injection - DIALYSIS  60 mcg Intravenous Q Wed-HD  . diltiazem  120 mg Oral Daily  . famotidine  20 mg Oral QHS  . feeding supplement (NEPRO CARB STEADY)  237 mL Oral Q1500  . feeding supplement (PRO-STAT SUGAR FREE 64)  30 mL Oral BID  . insulin aspart  0-15 Units Subcutaneous TID WC  . insulin aspart  0-5 Units Subcutaneous QHS  . levothyroxine  100 mcg Oral QAC breakfast  . midodrine  5 mg Oral TID WC  . multivitamin  1 tablet Oral QHS  . QUEtiapine  25 mg Oral QHS  . sertraline  25 mg Oral QHS  . sevelamer carbonate  1,600 mg Oral TID WC  Continuous Infusion Medications: None . sodium chloride    . sodium chloride    PRN  Medications:  sodium chloride, sodium chloride, acetaminophen, alteplase, aluminum hydroxide, artificial tears, bisacodyl, diphenhydrAMINE, guaiFENesin-dextromethorphan, heparin, heparin, ipratropium-albuterol, lidocaine (PF), lidocaine-prilocaine, ondansetron (ZOFRAN) IV, pentafluoroprop-tetrafluoroeth, phenol, polyethylene glycol, prochlorperazine **OR** prochlorperazine **OR** prochlorperazine, zinc oxide  Current Labs: Reviewed.  BMP Latest Ref Rng & Units 05/11/2017 05/10/2017 05/09/2017  Glucose 65 - 99 mg/dL 82 72 79  BUN 6 - 20 mg/dL 20 98(H) 74(H)  Creatinine 0.44 - 1.00 mg/dL 1.80(H) 4.91(H) 3.97(H)  Sodium 135 - 145 mmol/L 136 139 140  Potassium 3.5 - 5.1 mmol/L 4.1 4.6 5.4(H)  Chloride 101 - 111 mmol/L 99(L) 96(L) 98(L)  CO2 22 - 32 mmol/L 28 27 24   Calcium 8.9 - 10.3 mg/dL 8.2(L) 8.9 9.2  Phos: 4.8 (previously 6.9)  CBC CBC Latest Ref Rng & Units 05/11/2017 05/10/2017 05/07/2017  WBC 4.0 - 10.5 K/uL 10.9(H) 13.4(H) 13.5(H)  Hemoglobin 12.0 - 15.0 g/dL 9.2(L) 8.9(L) 9.1(L)  Hematocrit 36.0 - 46.0 % 29.1(L) 27.9(L) 27.2(L)  Platelets 150 - 400 K/uL 280 292 288   Physical Exam  Blood pressure 109/72, pulse 97, temperature (!) 97.5 F (36.4 C), temperature source Oral, resp.  rate 18, height 5\' 5"  (1.651 m), weight 158 lb 4.6 oz (71.8 kg), SpO2 100 %. GEN: Alert and oriented, pleasant and conversant CV: RRR, diastolic murmur PULM: CTAB ABD: Soft, nontender  SKIN: No rash. LLE wrapped in clean dry bandanges EXT: Min-no LE edema.  Assessment Ms. Colasanti is a 66yo female recently admitted to [facility] with septic and cardiogenic shock with multiorgan failure 2/2 cellulitis including anuric AKI for which she was started on CRRT and transitioned to intermittent HD (MWF). Hospital course complicated by PEA arrest, UE DVT; she received vanc/zosyn and had contrast exposure. She is now in physical rehabilitation following her month long hospitalization. 1. ESRD- iHD MWF via RIJ permacath,  previously CKD stage 3. Had HD 8/2, labs much improved. Cr 1.8, BUN 20 and normal K. Weight now 158 lbs (170 prior). She remains anuric. Dont think weights are right, and I think because is so small and does not have much muscle mass is why labs improved so dramatically- without urine she could not have renal recovery.  Her schedule here is MWF, a OP has been set up for TTS w/ Dr. Holley Raring at [facility]. Serologies have been negative. Needs to be done tomorrow b/c schedule got off here, not ready to change to TTS yet  2. Hypotension - On Midodrine for continued pressure support. Hypotensive to 90/48 during HD however resolved. 3. Anemia - On aranesp qwed. Iron studies returns normal. (Iron 43, TIBC 262) 4. Hyperphosphatemia - Continue Renvela. PTH 130 5. A fib - on diltiazem, amiodarone; eliquis 6. Hypothyroidism - on levothyroxine  Plan 1. HD tomorrow. Likely transition back to MWF schedule. Patient is hoping to avoid Saturday HD sessions, will work on outpatient schedule.  2. Will likely need access placement towards end of rehab stay - dont want to interfere with her rehab progress at this point. 3. Monitor for UOP closely. Wonder if patient could be making urine? 4. Continue Midodrine for pressure support. Monitor BP closely with HD sessions. 5. Aranesp every Wednesday with HD sessions- iron sat of 16- will replete.   6. PTH 130, on Renvela- no other tx needed at this point 7. Daily weights, strict ins/outs, fluid restriction 1232ml  Bethany Molt, DO - PGY2  05/11/2017, 9:04 AM   Patient seen and examined, agree with above note with above modifications. Tolerated HD yest, got off schedule - plan for HD tomorrow, then Monday.  Eventually will need to change to TTS for OP purposes but wants to put this off as long as possible.  Add iron.  Also perm HD access toward end of hosp  Corliss Parish, MD 05/11/2017

## 2017-05-11 NOTE — Progress Notes (Signed)
Occupational Therapy Session Note  Patient Details  Name: [name] MRN: [id number] Date of Birth: August 11, 1951  Today's Date: 05/11/2017 OT Individual Time: 1000-1057 OT Individual Time Calculation (min): 57 min    Short Term Goals: Week 1:  OT Short Term Goal 1 (Week 1): Pt will complete sit > stand in [place] with one person assist to decrease burden of care OT Short Term Goal 2 (Week 1): Pt will complete slide board transfer with max assist of one caregiver OT Short Term Goal 3 (Week 1): Pt will complete don pants with max assist at bed level  Skilled Therapeutic Interventions/Progress Updates:    Treatment session with focus on self-care retraining and sit > stand.  Pt received upright in w/c reporting desire to bathe.  Completed UB bathing seated in w/c with pt requiring initial assist for anterior weight shift to obtain items improving towards supervision with weight shift when able to utilize sink or Stedy for UE support to assist with weight shift.  Increased assist with UB dressing this session due to BUE weakness and decreased problem solving.  Completed grooming tasks in partial standing in [place] for increased postural control and weight bearing through BLE.  LB dressing and hygiene completed with +2 for sit > stand in [place].  Left upright in w/c with all needs in reach.  Therapy Documentation Precautions:  Precautions Precautions: Fall Restrictions Weight Bearing Restrictions: No Pain: Pain Assessment Pain Assessment: No/denies pain Pain Score: 0-No pain  See Function Navigator for Current Functional Status.   Therapy/Group: Individual Therapy  [name] 05/11/2017, 12:10 PM

## 2017-05-11 NOTE — Progress Notes (Signed)
Patient states she has sensation to urinate intermittently.  Bladder scanned patient with results of 54 ml.  Patient has been anuric thus far on rehab, but states MD told her that "her kidneys might start producing urine again based on her lab values."  Will continue to monitor patient output.  Brita Romp, RN

## 2017-05-11 NOTE — Progress Notes (Signed)
Physical Therapy Session Note  Patient Details  Name: [name] MRN: [id number] Date of Birth: 01-23-1951  Today's Date: 05/11/2017 PT Individual Time: 1130-1200 PT Individual Time Calculation (min): 30 min   Short Term Goals: Week 1:  PT Short Term Goal 1 (Week 1): pt will perform functional transfers wtih max A PT Short Term Goal 2 (Week 1): pt will perform sit to stand with max A  Skilled Therapeutic Interventions/Progress Updates:    Pt reports doing a lot of standing activities today and would like to focus on her UE's. Focused on UE strengthening and active assisted ROM (RUE limited) on UE ergometer (5 min forward and 4 min backwards on level 2 on random program) with rest breaks as needed. Seated 2# straight weight bicep curls, forward punches, and scapular retraction x 20 reps each.   Therapy Documentation Precautions:  Precautions Precautions: Fall Restrictions Weight Bearing Restrictions: No  Pain: Pain Assessment Pain Assessment: No/denies pain Pain Score: 0-No pain   See Function Navigator for Current Functional Status.   Therapy/Group: Individual Therapy  Canary Brim Ivory Broad, PT, DPT  05/11/2017, 12:04 PM

## 2017-05-12 ENCOUNTER — Inpatient Hospital Stay (HOSPITAL_COMMUNITY): Payer: Medicare Other | Admitting: Occupational Therapy

## 2017-05-12 LAB — GLUCOSE, CAPILLARY
GLUCOSE-CAPILLARY: 83 mg/dL (ref 65–99)
Glucose-Capillary: 68 mg/dL (ref 65–99)
Glucose-Capillary: 76 mg/dL (ref 65–99)
Glucose-Capillary: 65 mg/dL (ref 65–99)
Glucose-Capillary: 91 mg/dL (ref 65–99)
Glucose-Capillary: 72 mg/dL (ref 65–99)

## 2017-05-12 LAB — RENAL FUNCTION PANEL
Albumin: 3.2 g/dL — ABNORMAL LOW (ref 3.5–5.0)
Anion gap: 11 (ref 5–15)
BUN: 50 mg/dL — ABNORMAL HIGH (ref 6–20)
CHLORIDE: 97 mmol/L — AB (ref 101–111)
CO2: 27 mmol/L (ref 22–32)
Calcium: 8.7 mg/dL — ABNORMAL LOW (ref 8.9–10.3)
Creatinine, Ser: 3.32 mg/dL — ABNORMAL HIGH (ref 0.44–1.00)
GFR, EST AFRICAN AMERICAN: 16 mL/min — AB (ref >60)
GFR, EST NON AFRICAN AMERICAN: 14 mL/min — AB (ref >60)
Glucose, Bld: 76 mg/dL (ref 65–99)
POTASSIUM: 3.7 mmol/L (ref 3.5–5.1)
Phosphorus: 3.5 mg/dL (ref 2.5–4.6)
Sodium: 135 mmol/L (ref 135–145)

## 2017-05-12 LAB — CBC
HEMATOCRIT: 28.2 % — AB (ref 36.0–46.0)
Hemoglobin: 9 g/dL — ABNORMAL LOW (ref 12.0–15.0)
MCH: 31.9 pg (ref 26.0–34.0)
MCHC: 31.9 g/dL (ref 30.0–36.0)
MCV: 100 fL (ref 78.0–100.0)
PLATELETS: 250 10*3/uL (ref 150–400)
RBC: 2.82 MIL/uL — AB (ref 3.87–5.11)
RDW: 22.9 % — ABNORMAL HIGH (ref 11.5–15.5)
WBC: 12.4 10*3/uL — AB (ref 4.0–10.5)

## 2017-05-12 MED ORDER — HEPARIN SODIUM (PORCINE) 1000 UNIT/ML DIALYSIS
20.0000 [IU]/kg | INTRAMUSCULAR | Status: DC | PRN
  Administered 2017-05-12: 1400 [IU] via INTRAVENOUS_CENTRAL

## 2017-05-12 NOTE — Progress Notes (Signed)
Occupational Therapy Session Note  Patient Details  Name: [name] MRN: [id number] Date of Birth: Apr 15, 1951  Today's Date: 05/12/2017 OT Individual Time: 1402-1500 OT Individual Time Calculation (min): 58 min    Short Term Goals: Week 1:  OT Short Term Goal 1 (Week 1): Pt will complete sit > stand in [place] with one person assist to decrease burden of care OT Short Term Goal 2 (Week 1): Pt will complete slide board transfer with max assist of one caregiver OT Short Term Goal 3 (Week 1): Pt will complete don pants with max assist at bed level    Skilled Therapeutic Interventions/Progress Updates:    Tx focus on sit<stands, NMR, endurance, and standing tolerance during self care tasks.   Pt greeted supine in bed, requesting to bathe/dress. Max-Total A to transition to EOB. She required overall Mod A for bathing and to place Rt LE in figure 4 position (pt reported increased pain with placement of L LE in figure 4). Bilateral UEs required to squeeze lotion out of bottle, OT needed to assist with completion of task with unilateral involvement. Additional helper providing balance assist due to multiple posterior LOBs with dynamic activity. 2 helpers for sit<stand in [place] with bed elevated for LB self care. Max cues for maintaining upright posture, including forward gaze and neutral pelvic alignment. She required Mod A to don overhead shirt due to UB strength and ROM deficits. At end of tx pt was returned to supine (anticipating dialysis). Assisted OT and RT with boosting herself up in bed with use of bedrails.  Issued pt resistive ball to work on hand strengthening outside of tx.  Pt left with all needs within reach at time of departure.   Therapy Documentation Precautions:  Precautions Precautions: Fall Restrictions Weight Bearing Restrictions: No Vital Signs: Therapy Vitals Temp: (!) 97.5 F (36.4 C) Temp Source: Oral Pulse Rate: (!) 126 Resp: 17 BP: 101/67 Patient Position (if  appropriate): Lying Oxygen Therapy SpO2: 100 % O2 Device: Not Delivered Pain: No c/o pain during tx    ADL:      See Function Navigator for Current Functional Status.   Therapy/Group: Individual Therapy  Rolfe Hartsell A Matylda Fehring 05/12/2017, 4:37 PM

## 2017-05-12 NOTE — Significant Event (Signed)
Hypoglycemic Event  CBG: 65  Treatment: 15 GM carbohydrate snack  Symptoms: None  Follow-up CBG: Time:1340 CBG Result:91  Possible Reasons for Event: Unknown  Comments/MD notified:Dr Kirsteins, discontinued sliding scale.     [name]

## 2017-05-12 NOTE — Progress Notes (Signed)
Admit Date: 05/07/2017 LOS: 5 days    66 y/o F with CKD IIIa, HTN, hypothyroidism, HFrEF (LVEF 25-30%) and AFib on Pradaxa admitted 7/1 to [place] with septic and cardiogenic shock with MODS (2/2 LE cellulitis), including anuric AKI. Has been HD dependent for 1 month. Received Vanc/zosyn & contrast exposure. Initially on CRRT however transitioned to [facility] MWF. Her hospital course was also complicated by PEA arrest, LUE DVT and persistent hypotension. She is currently in physical rehab following her prolonged hospitalization.   Subjective: "I peed"  100 ccs Continues to feel well and is without complaint, other than weakness which seems to be improving.   Intake/Output Summary (Last 24 hours) at 05/12/17 0804 Last data filed at 05/11/17 1841  Gross per 24 hour  Intake              240 ml  Output                0 ml  Net              240 ml   Filed Weights   05/10/17 2055 05/11/17 0426 05/12/17 0526  Weight: 74 kg (163 lb 2.3 oz) 71.8 kg (158 lb 4.6 oz) 73 kg (160 lb 15 oz)   Current IP Medications . amiodarone  200 mg Oral BID  . apixaban  2.5 mg Oral BID  . collagenase   Topical Daily  . [START ON 05/17/2017] darbepoetin (ARANESP) injection - DIALYSIS  60 mcg Intravenous Q Thu-HD  . diltiazem  120 mg Oral Daily  . famotidine  20 mg Oral QHS  . feeding supplement (NEPRO CARB STEADY)  237 mL Oral Q1500  . feeding supplement (PRO-STAT SUGAR FREE 64)  30 mL Oral BID  . insulin aspart  0-15 Units Subcutaneous TID WC  . insulin aspart  0-5 Units Subcutaneous QHS  . levothyroxine  100 mcg Oral QAC breakfast  . midodrine  5 mg Oral TID WC  . multivitamin  1 tablet Oral QHS  . QUEtiapine  25 mg Oral QHS  . senna-docusate  2 tablet Oral QHS  . sertraline  25 mg Oral QHS  . sevelamer carbonate  1,600 mg Oral TID WC  Continuous Infusion Medications: None . sodium chloride    . sodium chloride    . ferric gluconate (FERRLECIT/NULECIT) IV    PRN Medications:  sodium chloride, sodium chloride,  acetaminophen, alteplase, aluminum hydroxide, artificial tears, bisacodyl, diphenhydrAMINE, guaiFENesin-dextromethorphan, heparin, heparin, ipratropium-albuterol, lidocaine (PF), lidocaine-prilocaine, ondansetron (ZOFRAN) IV, pentafluoroprop-tetrafluoroeth, phenol, polyethylene glycol, prochlorperazine **OR** prochlorperazine **OR** prochlorperazine, zinc oxide  Current Labs: Reviewed.  BMP Latest Ref Rng & Units 05/11/2017 05/10/2017 05/09/2017  Glucose 65 - 99 mg/dL 82 72 79  BUN 6 - 20 mg/dL 20 98(H) 74(H)  Creatinine 0.44 - 1.00 mg/dL 1.80(H) 4.91(H) 3.97(H)  Sodium 135 - 145 mmol/L 136 139 140  Potassium 3.5 - 5.1 mmol/L 4.1 4.6 5.4(H)  Chloride 101 - 111 mmol/L 99(L) 96(L) 98(L)  CO2 22 - 32 mmol/L 28 27 24   Calcium 8.9 - 10.3 mg/dL 8.2(L) 8.9 9.2  Phos: 4.8 (previously 6.9)  CBC CBC Latest Ref Rng & Units 05/11/2017 05/10/2017 05/07/2017  WBC 4.0 - 10.5 K/uL 10.9(H) 13.4(H) 13.5(H)  Hemoglobin 12.0 - 15.0 g/dL 9.2(L) 8.9(L) 9.1(L)  Hematocrit 36.0 - 46.0 % 29.1(L) 27.9(L) 27.2(L)  Platelets 150 - 400 K/uL 280 292 288   Physical Exam  Blood pressure 126/74, pulse 95, temperature (!) 97.5 F (36.4 C), temperature source Oral, resp. rate 18,  height 5\' 5"  (1.651 m), weight 73 kg (160 lb 15 oz), SpO2 94 %. GEN: Alert and oriented, pleasant and conversant CV: RRR, diastolic murmur PULM: CTAB ABD: Soft, nontender  SKIN: No rash. LLE wrapped in clean dry bandanges EXT: Min-no LE edema.  Assessment Ms. Chronis is a 66yo female recently admitted to [facility] with septic and cardiogenic shock with multiorgan failure 2/2 cellulitis including anuric AKI for which she was started on CRRT and transitioned to intermittent HD (MWF). Hospital course complicated by PEA arrest, UE DVT; she received vanc/zosyn and had contrast exposure. She is now in physical rehabilitation following her month long hospitalization. 1. ESRD- presumed due to prolonged AKI-  HD MWF via RIJ permacath. She was previously anuric but  now may be having some minimal UOP.  I think because is so small and does not have much muscle mass is why labs improved so dramatically- without much more urine she could not have renal recovery.  Will cont to follow pre HD labs however.  Her schedule here is MWF, a OP has been set up for TTS w/ Dr. Holley Raring at [facility]. Needs to be done today b/c schedule got off here, not ready to change to TTS yet. Will need access placement towards end of rehab stay - dont want to interfere with her rehab progress at this point 2. Hypotension - On Midodrine for continued pressure support. Hypotensive to 90/48 during HD however resolved. 3. Anemia - On aranesp qwed. Iron stores are low, repleting 4. Hyperphosphatemia - Continue Renvela. PTH 130 so no vit D needed 5. A fib - on diltiazem, amiodarone; eliquis 6. Hypothyroidism - on levothyroxine   Corliss Parish, MD 05/12/2017

## 2017-05-12 NOTE — Progress Notes (Signed)
[facility] PHYSICAL MEDICINE & REHABILITATION     PROGRESS NOTE  Subjective/Complaints:  No issues overnight  ROS: pt denies nausea, vomiting, diarrhea, cough, shortness of breath or chest pain   Objective: Vital Signs: Blood pressure 114/61, pulse 95, temperature (!) 97.5 F (36.4 C), temperature source Oral, resp. rate 18, height 5\' 5"  (1.651 m), weight 73 kg (160 lb 15 oz), SpO2 94 %. No results found.  Recent Labs  05/10/17 1711 05/11/17 0512  WBC 13.4* 10.9*  HGB 8.9* 9.2*  HCT 27.9* 29.1*  PLT 292 280    Recent Labs  05/10/17 1710 05/11/17 0512  NA 139 136  K 4.6 4.1  CL 96* 99*  GLUCOSE 72 82  BUN 98* 20  CREATININE 4.91* 1.80*  CALCIUM 8.9 8.2*   CBG (last 3)   Recent Labs  05/11/17 2108 05/12/17 0657 05/12/17 0838  GLUCAP 85 68 76    Wt Readings from Last 3 Encounters:  05/12/17 73 kg (160 lb 15 oz)  05/07/17 75.3 kg (166 lb 0.1 oz)    Physical Exam:  BP 114/61   Pulse 95   Temp (!) 97.5 F (36.4 C) (Oral)   Resp 18   Ht 5\' 5"  (1.651 m)   Wt 73 kg (160 lb 15 oz)   SpO2 94%   BMI 26.78 kg/m  Constitutional: She appears well-developed and well-nourished.  HENT: Normocephalic. Atraumatic. Eyes: EOMI. No discharge.  Cardiovascular:IRR/IRR---tachy. Respiratory: CTA Bilaterally without wheezes or rales. Normal effort .  GI: Soft. Bowel sounds are normal.   Musculoskeletal: She exhibits edema and tenderness.  Neurological: She is alert and oriented.  Able to follow commands without difficulty.  Motor: B/l UE 4/5 proximal to distal B/l LE; HF 2/5, KE 2+/5, ADF/PF 3/5 (stable) Skin: LLE with ulcerations with eschar on dorsum of foot and distal tibia with dressings in place. Psychiatric: She has a normal mood and affect. Her behavior is normal. Thought content normal.    Assessment/Plan: 1. 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 Physiatrist is providing close team supervision and 24 hour management of active medical problems listed below. Physiatrist and rehab team continue to assess barriers to discharge/monitor patient progress toward functional and medical goals.  Function:  Bathing Bathing position   Position: Wheelchair/chair at sink  Bathing parts Body parts bathed by patient: Right arm, Left arm, Chest, Abdomen Body parts bathed by helper: Front perineal area, Buttocks, Back  Bathing assist Assist Level: Touching or steadying assistance(Pt > 75%)      Upper Body Dressing/Undressing Upper body dressing   What is the patient wearing?: Pull over shirt/dress     Pull over shirt/dress - Perfomed by patient: Thread/unthread left sleeve, Put head through opening Pull over shirt/dress - Perfomed by helper: Thread/unthread right sleeve, Pull shirt over trunk        Upper body assist Assist Level:  (Mod assist)      Lower Body Dressing/Undressing Lower body dressing   What is the patient wearing?: Pants     Pants- Performed by patient: Pull pants up/down Pants- Performed by helper: Thread/unthread right pants leg, Thread/unthread left pants leg, Pull pants up/down   Non-skid slipper socks- Performed by helper: Don/doff right sock, Don/doff left sock                  Lower body assist Assist for lower body dressing: 2 Helpers  Toileting Toileting Toileting activity did not occur: N/A (anuric, no bm, no toileting performed)        Toileting assist     Transfers Chair/bed transfer     Chair/bed transfer assist level: 2 helpers Chair/bed transfer assistive device: Mechanical lift Mechanical lift: Ecologist Ambulation activity did not occur: Safety/medical concerns         Wheelchair   Type: Manual Max wheelchair distance: 10' Assist Level: Touching or steadying assistance (Pt > 75%)  Cognition Comprehension Comprehension assist level: Follows  basic conversation/direction with no assist  Expression Expression assist level: Expresses basic needs/ideas: With no assist  Social Interaction Social Interaction assist level: Interacts appropriately with others - No medications needed.  Problem Solving Problem solving assist level: Solves basic 90% of the time/requires cueing < 10% of the time  Memory Memory assist level: More than reasonable amount of time    Medical Problem List and Plan: 1.  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  DVT Prophylaxis/Anticoagulation: Pharmaceutical: Other (comment)--Eliquis 3. Pain Management: tylenol prn 4. Mood: LCSW to follow for evaluation and support.  5. Neuropsych: This patient is capable of making decisions on her own behalf. 6. Skin/Wound Care: routine pressure relief measures. Prevalon boot for heel ulcer.  7. Fluids/Electrolytes/Nutrition: Strict I/Os. Monitor daily weights.   8.  Hypotension: Monitor orthostatic BP. Added abdominal binder and TEDs to help with BP support.    Vitals:   05/12/17 0526 05/12/17 1049  BP: 126/74 114/61  Pulse: 95   Resp: 18   Temp: (!) 97.5 F (36.4 C)    9. Afib:  -on eliquis for stroke prevention. Continue amiodarone bid and Cardizem daily.   -HR a little elevated---monitor for now 10. Leukocytosis:              Patient with ulcers--continue with WOC recs/daily dressing             Afebrile             WBCs down 10.9 8/3              Cont to monitor 11. Anemia of chronic disease: On epogen  MWF.              Hb 10.9  8/3             Cont to montior 12. Left foot ulcer: Daily dressing changes with santyl. Monitor for signs of infection.  13. Anxiety/depression: On Seroquel and Zoloft at nights.  14. Prediabetes: Will consult dietitian to educate pateint on diet.  Hgb A1c- 6.2. Monitor BS ac/hs. Use SSI for elevated BS CBG (last 3)   Recent Labs  05/11/17 2108 05/12/17 0657 05/12/17 0838  GLUCAP 85  68 76     Controlled 8/415. CKD now HD dependent: HD on MWF at end of day to help with tolerance of therapy. Renal diet--Reducate patient on appropriate diet.    LOS (Days) 5 A FACE TO FACE EVALUATION WAS PERFORMED  Alysia Penna E 05/12/2017 11:47 AM

## 2017-05-13 ENCOUNTER — Inpatient Hospital Stay (HOSPITAL_COMMUNITY): Payer: Medicare Other

## 2017-05-13 ENCOUNTER — Inpatient Hospital Stay (HOSPITAL_COMMUNITY): Payer: Medicare Other | Admitting: Physical Therapy

## 2017-05-13 DIAGNOSIS — N186 End stage renal disease: Secondary | ICD-10-CM

## 2017-05-13 DIAGNOSIS — Z992 Dependence on renal dialysis: Secondary | ICD-10-CM

## 2017-05-13 LAB — GLUCOSE, CAPILLARY
Glucose-Capillary: 78 mg/dL (ref 65–99)
Glucose-Capillary: 94 mg/dL (ref 65–99)
Glucose-Capillary: 96 mg/dL (ref 65–99)
Glucose-Capillary: 81 mg/dL (ref 65–99)

## 2017-05-13 NOTE — Progress Notes (Signed)
Rough night, returned from HD at 0350. [name]

## 2017-05-13 NOTE — Progress Notes (Signed)
Occupational Therapy Session Note  Patient Details  Name: [name] MRN: [id number] Date of Birth: 29-Jun-1951  Today's Date: 05/13/2017 OT Individual Time: 1000-1100 OT Individual Time Calculation (min): 60 min    Short Term Goals: Week 1:  OT Short Term Goal 1 (Week 1): Pt will complete sit > stand in [place] with one person assist to decrease burden of care OT Short Term Goal 2 (Week 1): Pt will complete slide board transfer with max assist of one caregiver OT Short Term Goal 3 (Week 1): Pt will complete don pants with max assist at bed level  Skilled Therapeutic Interventions/Progress Updates:    1:1. Focus of session on self care retraining and bed level and sitting in stedy. In supported long sitting, Pt washes all body parts except buttocks, back and B feet. OT assists pt bring LE into long sitting figure 4 with MOD A for pt to wash and apply lotion to shins. Pt requires to use B hands to squeeze lotion out of bottles and support at elbow to eliminate gravity to spread lotion on BLE. Pt semi reclined>EOB with MAX A and sit to stand in stedy with MAX A of 2 for lifting. Pt completes UB bathing in stedy seated on flaps for weight bearing through BLE. Pt able to maintain sitting balance with supervision. Pt dons shirt by threading BUE into straps and pulling shirt over trunk. OT demonstrates how to use dressing stick to thread shirt over head. Pt requires A to create enough force to thread head into shirt. Pt brushes teeth with set up and hair with HOH A to brush back of head. Exited session with pt semi reclined in bed, call light in reach and all needs met.  Therapy Documentation Precautions:  Precautions Precautions: Fall Restrictions Weight Bearing Restrictions: No  See Function Navigator for Current Functional Status.   Therapy/Group: Individual Therapy  Tonny Branch 05/13/2017, 12:05 PM

## 2017-05-13 NOTE — Progress Notes (Signed)
[facility] PHYSICAL MEDICINE & REHABILITATION     PROGRESS NOTE  Subjective/Complaints:  Patient had 2 therapies this morning and has 3 more this afternoon. Had a long night in dialysis, returned at 3 AM Switching to Monday, Wednesday, Friday schedule  ROS: pt denies nausea, vomiting, diarrhea, cough, shortness of breath or chest pain   Objective: Vital Signs: Blood pressure 135/81, pulse 90, temperature (!) 97.5 F (36.4 C), temperature source Oral, resp. rate 18, height 5\' 5"  (1.651 m), weight 68 kg (150 lb), SpO2 99 %. No results found.  Recent Labs  05/11/17 0512 05/12/17 2315  WBC 10.9* 12.4*  HGB 9.2* 9.0*  HCT 29.1* 28.2*  PLT 280 250    Recent Labs  05/11/17 0512 05/12/17 2315  NA 136 135  K 4.1 3.7  CL 99* 97*  GLUCOSE 82 76  BUN 20 50*  CREATININE 1.80* 3.32*  CALCIUM 8.2* 8.7*   CBG (last 3)   Recent Labs  05/12/17 2119 05/13/17 0648 05/13/17 1126  GLUCAP 72 78 94    Wt Readings from Last 3 Encounters:  05/13/17 68 kg (150 lb)  05/07/17 75.3 kg (166 lb 0.1 oz)    Physical Exam:  BP 135/81   Pulse 90   Temp (!) 97.5 F (36.4 C) (Oral)   Resp 18   Ht 5\' 5"  (1.651 m)   Wt 68 kg (150 lb)   SpO2 99%   BMI 24.96 kg/m  Constitutional: She appears well-developed and well-nourished.  HENT: Normocephalic. Atraumatic. Eyes: EOMI. No discharge.  Cardiovascular:IRR/IRR---tachy. Respiratory: CTA Bilaterally without wheezes or rales. Normal effort .  GI: Soft. Bowel sounds are normal.   Musculoskeletal: She exhibits edema and tenderness.  Neurological: She is alert and oriented.  Able to follow commands without difficulty.  Motor: B/l UE 4/5 proximal to distal B/l LE; HF 2/5, KE 2+/5, ADF/PF 3/5 (stable) Skin: LLE with ulcerations with eschar on dorsum of foot and distal tibia with dressings in place. Psychiatric: She has a normal mood and affect. Her behavior is normal. Thought content normal.    Assessment/Plan: 1. Functional deficits  secondary to CIm which require 3+ hours per day of interdisciplinary therapy in a comprehensive inpatient rehab setting. Physiatrist is providing close team supervision and 24 hour management of active medical problems listed below. Physiatrist and rehab team continue to assess barriers to discharge/monitor patient progress toward functional and medical goals.  Function:  Bathing Bathing position   Position: Sitting EOB  Bathing parts Body parts bathed by patient: Right arm, Left arm, Chest, Abdomen, Front perineal area, Right upper leg, Left upper leg Body parts bathed by helper: Front perineal area, Buttocks, Back  Bathing assist Assist Level: Touching or steadying assistance(Pt > 75%)      Upper Body Dressing/Undressing Upper body dressing   What is the patient wearing?: Pull over shirt/dress     Pull over shirt/dress - Perfomed by patient: Thread/unthread right sleeve, Thread/unthread left sleeve Pull over shirt/dress - Perfomed by helper: Put head through opening, Pull shirt over trunk        Upper body assist Assist Level:  (Mod assist)      Lower Body Dressing/Undressing Lower body dressing   What is the patient wearing?: Pants, Non-skid slipper socks     Pants- Performed by patient: Pull pants up/down Pants- Performed by helper: Thread/unthread right pants leg, Thread/unthread left pants leg, Pull pants up/down   Non-skid slipper socks- Performed by helper: Don/doff right sock, Don/doff left sock  Lower body assist Assist for lower body dressing: 2 Helpers      Toileting Toileting Toileting activity did not occur: N/A (anuric, no bm, no toileting performed)        Toileting assist     Transfers Chair/bed transfer     Chair/bed transfer assist level: 2 helpers Chair/bed transfer assistive device: Mechanical lift Mechanical lift: Ecologist Ambulation activity did not occur: Safety/medical concerns          Wheelchair   Type: Manual Max wheelchair distance: 10' Assist Level: Touching or steadying assistance (Pt > 75%)  Cognition Comprehension Comprehension assist level: Follows basic conversation/direction with no assist  Expression Expression assist level: Expresses basic needs/ideas: With no assist  Social Interaction Social Interaction assist level: Interacts appropriately with others - No medications needed.  Problem Solving Problem solving assist level: Solves basic 90% of the time/requires cueing < 10% of the time  Memory Memory assist level: More than reasonable amount of time    Medical Problem List and Plan: 1.  Deficits in mobility and ability to carry out ADL tasks secondary to CIM  Cont CIR, PT, OT 2.  DVT Prophylaxis/Anticoagulation: Pharmaceutical: Other (comment)--Eliquis 3. Pain Management: tylenol prn 4. Mood: LCSW to follow for evaluation and support.  5. Neuropsych: This patient is capable of making decisions on her own behalf. 6. Skin/Wound Care: routine pressure relief measures. Prevalon boot for heel ulcer.  7. Fluids/Electrolytes/Nutrition: Strict I/Os. Monitor daily weights.   8.  Hypotension: Monitor orthostatic BP. Added abdominal binder and TEDs to help with BP support.    Vitals:   05/13/17 0349 05/13/17 0923  BP: 107/80 135/81  Pulse: 90   Resp: 18   Temp: (!) 97.5 F (36.4 C)    9. Afib:  -on eliquis for stroke prevention. Continue amiodarone bid and Cardizem daily.   -HR a little elevated---monitor for now 10. Leukocytosis:              Patient with ulcers--continue with WOC recs/daily dressing             Afebrile             WBCs down 10.9 8/3              Cont to monitor 11. Anemia of chronic disease: On epogen  MWF.              Hb 10.9  8/3             Cont to montior 12. Left foot ulcer: Daily dressing changes with santyl. Monitor for signs of infection.  13. Anxiety/depression: On Seroquel and Zoloft at nights.  14. Prediabetes:Has had  some hypoglycemia on the sliding scale, off all hypoglycemic medications, may discontinue CBG (last 3)   Recent Labs  05/12/17 2119 05/13/17 0648 05/13/17 1126  GLUCAP 72 78 94    15. CKD now HD dependent: HD on MWF at end of day to help with tolerance of therapy. Renal diet--Reducate patient on appropriate diet.    LOS (Days) 6 A FACE TO FACE EVALUATION WAS PERFORMED  Alysia Penna E 05/13/2017 11:42 AM

## 2017-05-13 NOTE — Progress Notes (Signed)
Occupational Therapy Session Note  Patient Details  Name: [name] MRN: [id number] Date of Birth: 06-01-1951  Today's Date: 05/13/2017 OT Individual Time: 1530-1600 OT Individual Time Calculation (min): 30 min    Short Term Goals: Week 1:  OT Short Term Goal 1 (Week 1): Pt will complete sit > stand in [place] with one person assist to decrease burden of care OT Short Term Goal 2 (Week 1): Pt will complete slide board transfer with max assist of one caregiver OT Short Term Goal 3 (Week 1): Pt will complete don pants with max assist at bed level  Skilled Therapeutic Interventions/Progress Updates:    1:1. Pt sitting in w/c upon arrival with no c/o pain. OT propels w/c for time management. Pt plays game of scrabble with mod VC for rule/scoring and reach with arm to play letters without sliding arm across board to improve BUE strength required for BADLs. Pt completes slide board transfer w/c>EOB>supine with MAX A with VC for head hip relationship. Exited session with pt seated in bed with call light in reach and all needs met.   Therapy Documentation Precautions:  Precautions Precautions: Fall Restrictions Weight Bearing Restrictions: No  See Function Navigator for Current Functional Status.   Therapy/Group: Individual Therapy  Tonny Branch 05/13/2017, 5:50 PM

## 2017-05-13 NOTE — Progress Notes (Addendum)
Physical Therapy Note  Patient Details  Name: Roya Gieselman MRN: [id number] Date of Birth: [birth year] Today's Date: 05/13/2017    Time 1: 900-930 30 minutes  1:1 No c/o pain.  Pt awake and ready for treatment despite arriving back from HD at 3am.  Pt performed supine to sit with max A.  Sliding board transfer to w/c with max A, +2 for safety.  Standing frame with reaching task x 5 minutes, then pt states need to use the restroom.  Pt back to bed with +2 sliding board transfer.  Mod A for rolling side to side for bedpan placement.  Pt left with needs at hand, nursing present to give meds.  Missed final 15 minutes of session for toileting and meds.   Time 2:  1300-1355 55 minutes  1:1 No c/o pain.  Sliding board transfer with max A, +2 for safety.  Standing frame x 15 minutes with reaching task and putting together a puzzle, pt able to stand without harness in standing frame at end of session.  Sit to stand in stedy with +2 total A. Seated in stedy pt performed ball tap 2 x 10 bilat with close supervision for balance/posture.  Pt requires +2 total A to stand from stedy.  Seated in w/c pt performs ball tap 2 x 3 minutes for UE strength and endurance.  Etherine Mackowiak 05/13/2017, 9:28 AM

## 2017-05-13 NOTE — Progress Notes (Signed)
Admit Date: 05/07/2017 LOS: 46 days    66 y/o F with CKD IIIa, HTN, hypothyroidism, HFrEF (LVEF 25-30%) and AFib on Pradaxa admitted 7/1 to [place] with septic and cardiogenic shock with MODS (2/2 LE cellulitis), including anuric AKI. Has been HD dependent since early July. Received Vanc/zosyn & contrast exposure. Initially on CRRT however transitioned to [facility] MWF. Her hospital course was also complicated by PEA arrest, LUE DVT and persistent hypotension. She is currently in physical rehab following her prolonged hospitalization.   Subjective:   HD last night, removed 1800- tolerated well- no UOP recorded but she says she made some Continues to feel well and is without complaint, other than weakness which seems to be improving.   Intake/Output Summary (Last 24 hours) at 05/13/17 1013 Last data filed at 05/13/17 0314  Gross per 24 hour  Intake              400 ml  Output             1825 ml  Net            -1425 ml   Filed Weights   05/12/17 2300 05/13/17 0314 05/13/17 0349  Weight: 70 kg (154 lb 5.2 oz) 68.3 kg (150 lb 9.2 oz) 68 kg (150 lb)   Current IP Medications . amiodarone  200 mg Oral BID  . apixaban  2.5 mg Oral BID  . collagenase   Topical Daily  . [START ON 05/17/2017] darbepoetin (ARANESP) injection - DIALYSIS  60 mcg Intravenous Q Thu-HD  . diltiazem  120 mg Oral Daily  . famotidine  20 mg Oral QHS  . feeding supplement (NEPRO CARB STEADY)  237 mL Oral Q1500  . feeding supplement (PRO-STAT SUGAR FREE 64)  30 mL Oral BID  . levothyroxine  100 mcg Oral QAC breakfast  . midodrine  5 mg Oral TID WC  . multivitamin  1 tablet Oral QHS  . QUEtiapine  25 mg Oral QHS  . senna-docusate  2 tablet Oral QHS  . sertraline  25 mg Oral QHS  . sevelamer carbonate  1,600 mg Oral TID WC  Continuous Infusion Medications: None . ferric gluconate (FERRLECIT/NULECIT) IV Stopped (05/13/17 0301)  PRN Medications:  acetaminophen, aluminum hydroxide, artificial tears, bisacodyl, diphenhydrAMINE,  guaiFENesin-dextromethorphan, ipratropium-albuterol, ondansetron (ZOFRAN) IV, phenol, polyethylene glycol, prochlorperazine **OR** prochlorperazine **OR** prochlorperazine, zinc oxide  Current Labs: Reviewed.  BMP Latest Ref Rng & Units 05/12/2017 05/11/2017 05/10/2017  Glucose 65 - 99 mg/dL 76 82 72  BUN 6 - 20 mg/dL 50(H) 20 98(H)  Creatinine 0.44 - 1.00 mg/dL 3.32(H) 1.80(H) 4.91(H)  Sodium 135 - 145 mmol/L 135 136 139  Potassium 3.5 - 5.1 mmol/L 3.7 4.1 4.6  Chloride 101 - 111 mmol/L 97(L) 99(L) 96(L)  CO2 22 - 32 mmol/L 27 28 27   Calcium 8.9 - 10.3 mg/dL 8.7(L) 8.2(L) 8.9  Phos: 4.8 (previously 6.9)  CBC CBC Latest Ref Rng & Units 05/12/2017 05/11/2017 05/10/2017  WBC 4.0 - 10.5 K/uL 12.4(H) 10.9(H) 13.4(H)  Hemoglobin 12.0 - 15.0 g/dL 9.0(L) 9.2(L) 8.9(L)  Hematocrit 36.0 - 46.0 % 28.2(L) 29.1(L) 27.9(L)  Platelets 150 - 400 K/uL 250 280 292   Physical Exam  Blood pressure 135/81, pulse 90, temperature (!) 97.5 F (36.4 C), temperature source Oral, resp. rate 18, height 5\' 5"  (1.651 m), weight 68 kg (150 lb), SpO2 99 %. GEN: Alert and oriented, pleasant and conversant CV: RRR, diastolic murmur PULM: CTAB ABD: Soft, nontender  SKIN: No rash. LLE wrapped  in clean dry bandanges EXT: Min-no LE edema.  Assessment Ms. Wachtel is a 66yo female recently admitted to [facility] with septic and cardiogenic shock with multiorgan failure including anuric AKI for which she was started on CRRT and transitioned to intermittent HD (MWF). Hospital course complicated by PEA arrest, UE DVT. She is now in physical rehabilitation following her month long hospitalization. 1. ESRD- presumed due to prolonged AKI-  HD MWF via RIJ permacath. She was previously anuric but now may be having some minimal UOP.  I think because is so small and does not have much muscle mass is why labs improved so dramatically- without much more urine she could not have renal recovery.  Will cont to follow pre HD labs however and go for  small amounts of UF.  Her schedule here is MWF, a OP has been set up for TTS w/ Dr. Holley Raring at [facility]. Not ready to change to TTS yet but will need to by discharge. Will need access placement towards end of rehab stay as well- dont want to interfere with her rehab progress at this point 2. Hypotension - On Midodrine for continued pressure support.  3. Anemia - On aranesp qwed. Iron stores are low, repleting- last hgb 9 4. Hyperphosphatemia - Continue Renvela. PTH 130 so no vit D needed 5. A fib - on diltiazem, amiodarone; eliquis 6. Hypothyroidism - on levothyroxine   Corliss Parish, MD 05/13/2017

## 2017-05-14 ENCOUNTER — Inpatient Hospital Stay (HOSPITAL_COMMUNITY): Payer: Medicare Other | Admitting: Occupational Therapy

## 2017-05-14 ENCOUNTER — Inpatient Hospital Stay (HOSPITAL_COMMUNITY): Payer: Medicare Other | Admitting: Physical Therapy

## 2017-05-14 DIAGNOSIS — E11649 Type 2 diabetes mellitus with hypoglycemia without coma: Secondary | ICD-10-CM | POA: Insufficient documentation

## 2017-05-14 DIAGNOSIS — L97522 Non-pressure chronic ulcer of other part of left foot with fat layer exposed: Secondary | ICD-10-CM

## 2017-05-14 DIAGNOSIS — L97922 Non-pressure chronic ulcer of unspecified part of left lower leg with fat layer exposed: Secondary | ICD-10-CM

## 2017-05-14 LAB — CBC
HCT: 30 % — ABNORMAL LOW (ref 36.0–46.0)
HEMOGLOBIN: 9.4 g/dL — AB (ref 12.0–15.0)
MCH: 31.9 pg (ref 26.0–34.0)
MCHC: 31.3 g/dL (ref 30.0–36.0)
MCV: 101.7 fL — ABNORMAL HIGH (ref 78.0–100.0)
PLATELETS: 218 10*3/uL (ref 150–400)
RBC: 2.95 MIL/uL — AB (ref 3.87–5.11)
RDW: 23.1 % — ABNORMAL HIGH (ref 11.5–15.5)
WBC: 13.7 10*3/uL — AB (ref 4.0–10.5)

## 2017-05-14 LAB — RENAL FUNCTION PANEL
ALBUMIN: 3.3 g/dL — AB (ref 3.5–5.0)
ANION GAP: 10 (ref 5–15)
BUN: 25 mg/dL — ABNORMAL HIGH (ref 6–20)
CALCIUM: 8.9 mg/dL (ref 8.9–10.3)
CO2: 29 mmol/L (ref 22–32)
CREATININE: 2.48 mg/dL — AB (ref 0.44–1.00)
Chloride: 100 mmol/L — ABNORMAL LOW (ref 101–111)
GFR, EST AFRICAN AMERICAN: 22 mL/min — AB (ref >60)
GFR, EST NON AFRICAN AMERICAN: 19 mL/min — AB (ref >60)
Glucose, Bld: 90 mg/dL (ref 65–99)
PHOSPHORUS: 2.2 mg/dL — AB (ref 2.5–4.6)
Potassium: 3.9 mmol/L (ref 3.5–5.1)
SODIUM: 139 mmol/L (ref 135–145)

## 2017-05-14 LAB — GLUCOSE, CAPILLARY
GLUCOSE-CAPILLARY: 69 mg/dL (ref 65–99)
GLUCOSE-CAPILLARY: 77 mg/dL (ref 65–99)
Glucose-Capillary: 64 mg/dL — ABNORMAL LOW (ref 65–99)
Glucose-Capillary: 69 mg/dL (ref 65–99)
Glucose-Capillary: 90 mg/dL (ref 65–99)
Glucose-Capillary: 108 mg/dL — ABNORMAL HIGH (ref 65–99)

## 2017-05-14 MED ORDER — MIDODRINE HCL 5 MG PO TABS
ORAL_TABLET | ORAL | Status: AC
  Administered 2017-05-14: 5 mg via ORAL
  Filled 2017-05-14: qty 1

## 2017-05-14 MED ORDER — LIDOCAINE 4 % EX CREA
TOPICAL_CREAM | Freq: Once | CUTANEOUS | Status: AC
  Administered 2017-05-14: 12:00:00 via TOPICAL
  Filled 2017-05-14: qty 5

## 2017-05-14 MED ORDER — HEPARIN SODIUM (PORCINE) 1000 UNIT/ML DIALYSIS
20.0000 [IU]/kg | INTRAMUSCULAR | Status: DC | PRN
  Administered 2017-05-14: 1400 [IU] via INTRAVENOUS_CENTRAL
  Filled 2017-05-14 (×2): qty 2

## 2017-05-14 NOTE — Procedures (Signed)
I have personally attended this patient's dialysis session.   3 K bath (K 3.9) 1 liter fluid removal goal TDC 400 no issues   Recent Labs  05/12/17 2315 05/14/17 1549  NA 135 139  K 3.7 3.9  CL 97* 100*  CO2 27 29  GLUCOSE 76 90  BUN 50* 25*  CREATININE 3.32* 2.48*  CALCIUM 8.7* 8.9  PHOS 3.5 2.2*    Recent Labs  05/12/17 2315 05/14/17 1548  WBC 12.4* 13.7*  HGB 9.0* 9.4*  HCT 28.2* 30.0*  MCV 100.0 101.7*  PLT [address] [place], MD [facility] [phone] Pager 05/14/2017, 5:10 PM

## 2017-05-14 NOTE — Progress Notes (Signed)
[facility] PHYSICAL MEDICINE & REHABILITATION     PROGRESS NOTE  Subjective/Complaints:  Pt seen laying in bed this AM.  She slept well overnight and notes a tiring day of therapies yesterday.  She feels her strength is improving.  ROS: Denies nausea, vomiting, diarrhea, shortness of breath or chest pain   Objective: Vital Signs: Blood pressure 112/86, pulse 100, temperature (!) 97.5 F (36.4 C), temperature source Oral, resp. rate 16, height 5\' 5"  (1.651 m), weight 74 kg (163 lb 2.3 oz), SpO2 100 %. No results found.  Recent Labs  05/12/17 2315  WBC 12.4*  HGB 9.0*  HCT 28.2*  PLT 250    Recent Labs  05/12/17 2315  NA 135  K 3.7  CL 97*  GLUCOSE 76  BUN 50*  CREATININE 3.32*  CALCIUM 8.7*   CBG (last 3)   Recent Labs  05/14/17 0638 05/14/17 0716 05/14/17 0823  GLUCAP 64* 69 90    Wt Readings from Last 3 Encounters:  05/14/17 74 kg (163 lb 2.3 oz)  05/07/17 75.3 kg (166 lb 0.1 oz)    Physical Exam:  BP 112/86 (BP Location: Right Arm)   Pulse 100   Temp (!) 97.5 F (36.4 C) (Oral)   Resp 16   Ht 5\' 5"  (1.651 m)   Wt 74 kg (163 lb 2.3 oz)   SpO2 100%   BMI 27.15 kg/m  Constitutional: She appears well-developed and well-nourished.  HENT: Normocephalic. Atraumatic. Eyes: EOMI. No discharge.  Cardiovascular: IRIR Respiratory: CTA Bilaterally. Normal effort .  GI: Soft. Bowel sounds are normal.   Musculoskeletal: She exhibits edema and tenderness.  Neurological: She is alert and oriented.  Able to follow commands without difficulty.  Motor: B/l UE 4/5 proximal to distal B/l LE; HF 2/5, KE 2+/5, ADF/PF 3/5 (improving) Skin: LLE with ulcerations with eschar on dorsum of foot and distal tibia with dressings in place. Psychiatric: She has a normal mood and affect. Her behavior is normal. Thought content normal.    Assessment/Plan: 1. 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 Physiatrist is providing close team supervision and 24 hour management of active medical problems listed below. Physiatrist and rehab team continue to assess barriers to discharge/monitor patient progress toward functional and medical goals.  Function:  Bathing Bathing position   Position: Bed  Bathing parts Body parts bathed by patient: Right arm, Left arm, Chest, Abdomen, Front perineal area, Right upper leg, Left upper leg Body parts bathed by helper: Front perineal area, Buttocks, Back  Bathing assist Assist Level: Touching or steadying assistance(Pt > 75%)      Upper Body Dressing/Undressing Upper body dressing   What is the patient wearing?: Pull over shirt/dress     Pull over shirt/dress - Perfomed by patient: Put head through opening, Thread/unthread left sleeve, Pull shirt over trunk Pull over shirt/dress - Perfomed by helper: Put head through opening, Pull shirt over trunk        Upper body assist Assist Level: Touching or steadying assistance(Pt > 75%)      Lower Body Dressing/Undressing Lower body dressing   What is the patient wearing?: Pants     Pants- Performed by patient: Pull pants up/down Pants- Performed by helper: Thread/unthread right pants leg, Thread/unthread left pants leg, Pull pants up/down   Non-skid slipper socks- Performed by helper: Don/doff right sock, Don/doff left sock  Lower body assist Assist for lower body dressing: Touching or steadying assistance (Pt > 75%)      Toileting Toileting Toileting activity did not occur: N/A (anuric, no bm, no toileting performed)   Toileting steps completed by helper: Adjust clothing prior to toileting, Performs perineal hygiene, Adjust clothing after toileting    Toileting assist     Transfers Chair/bed transfer     Chair/bed transfer assist level: 2 helpers Chair/bed transfer assistive device: Mechanical lift Mechanical lift: Stedy    Locomotion Ambulation Ambulation activity did not occur: Safety/medical concerns         Wheelchair   Type: Manual Max wheelchair distance: 10' Assist Level: Touching or steadying assistance (Pt > 75%)  Cognition Comprehension Comprehension assist level: Follows basic conversation/direction with no assist  Expression Expression assist level: Expresses basic needs/ideas: With no assist  Social Interaction Social Interaction assist level: Interacts appropriately with others - No medications needed.  Problem Solving Problem solving assist level: Solves complex problems: With extra time  Memory Memory assist level: More than reasonable amount of time    Medical Problem List and Plan: 1.  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  DVT Prophylaxis/Anticoagulation: Pharmaceutical: Other (comment)--Eliquis 3. Pain Management: tylenol prn 4. Mood: LCSW to follow for evaluation and support.  5. Neuropsych: This patient is capable of making decisions on her own behalf. 6. Skin/Wound Care: routine pressure relief measures. Prevalon boot for heel ulcer.   Plan for debridement 7. Fluids/Electrolytes/Nutrition: Strict I/Os. Monitor daily weights.   8.  Hypotension: Monitor orthostatic BP. Added abdominal binder and TEDs to help with BP support.    Vitals:   05/13/17 1404 05/14/17 0436  BP: 95/62 112/86  Pulse: 68 100  Resp: 18 16  Temp: 97.8 F (36.6 C) (!) 97.5 F (36.4 C)   9. Paroxysmal Afib:  -on eliquis for stroke prevention. Continue amiodarone bid and Cardizem daily.  10. Leukocytosis:              Patient with ulcers--continue with WOC recs/daily dressing             Afebrile             WBCs 12.4 on 8/4             Cont to monitor 11. Anemia of chronic disease: On epogen  MWF.              Hb 9.0 on 8/4             Cont to montior 12. Left foot ulcer: Daily dressing changes with santyl. Monitor for signs of infection.  13.  Anxiety/depression: On Seroquel and Zoloft at nights.  14. Prediabetes: CBG (last 3)   Recent Labs  05/14/17 0638 05/14/17 0716 05/14/17 0823  GLUCAP 64* 69 90   Hypoglycemia overnight 15. CKD now HD dependent: HD on MWF at end of day to help with tolerance of therapy. Renal diet--Reducate patient on appropriate diet.   LOS (Days) 7 A FACE TO FACE EVALUATION WAS PERFORMED  Quintavious Rinck Lorie Phenix 05/14/2017 9:44 AM

## 2017-05-14 NOTE — Progress Notes (Signed)
Per NT, patient refused HS snack. [name] A

## 2017-05-14 NOTE — Significant Event (Signed)
Hypoglycemic Event  CBG: 64  Treatment: 15 GM carbohydrate snack  Symptoms: None  Follow-up CBG: Time:0716 CBG Result:69  Possible Reasons for Event: Inadequate meal intake  Comments/MD notified:delayed recheck because patient needed to use BR. treated per protocol.    [name] A

## 2017-05-14 NOTE — Progress Notes (Signed)
[facility] Rounding Note   Subjective:    Feels well and is without complaint, other than weakness which she feels is improving slowly. Felt she noticed UOP over the weekend and notes using the bedpan twice for urination in the past 24 hrs. Small volume.   Intake/Output Summary (Last 24 hours) at 05/14/17 1206 Last data filed at 05/14/17 1000  Gross per 24 hour  Intake              420 ml  Output                0 ml  Net              420 ml   Filed Weights   05/13/17 0314 05/13/17 0349 05/14/17 0436  Weight: 150 lb 9.2 oz (68.3 kg) 150 lb (68 kg) 163 lb 2.3 oz (74 kg)    Physical Exam  Blood pressure 112/86, pulse 100, temperature (!) 97.5 F (36.4 C), temperature source Oral, resp. rate 16, height 5\' 5"  (1.651 m), weight 163 lb 2.3 oz (74 kg), SpO2 100 %. GEN: Alert and oriented, pleasant and conversant. Right IJ permacath (placed 04/26/17) CV: IRIR, rate controlled. Diastolic murmur PULM: CTAB, able to speak in complete sentences. Normal WOB on RA.  ABD: Soft, nontender  SKIN: No rash. LLE with clean dry bandanges EXT: Mild LE edema.  Current IP Medications . amiodarone  200 mg Oral BID  . apixaban  2.5 mg Oral BID  . collagenase   Topical Daily  . [START ON 05/17/2017] darbepoetin (ARANESP) injection - DIALYSIS  60 mcg Intravenous Q Thu-HD  . diltiazem  120 mg Oral Daily  . famotidine  20 mg Oral QHS  . feeding supplement (NEPRO CARB STEADY)  237 mL Oral Q1500  . feeding supplement (PRO-STAT SUGAR FREE 64)  30 mL Oral BID  . levothyroxine  100 mcg Oral QAC breakfast  . midodrine  5 mg Oral TID WC  . multivitamin  1 tablet Oral QHS  . QUEtiapine  25 mg Oral QHS  . senna-docusate  2 tablet Oral QHS  . sertraline  25 mg Oral QHS  . sevelamer carbonate  1,600 mg Oral TID WC   Continuous Infusion Medications: None . ferric gluconate (FERRLECIT/NULECIT) IV Stopped (05/13/17 0301)   PRN Medications:  acetaminophen, aluminum hydroxide, artificial tears, bisacodyl, diphenhydrAMINE,  guaiFENesin-dextromethorphan, ipratropium-albuterol, ondansetron (ZOFRAN) IV, phenol, polyethylene glycol, prochlorperazine **OR** prochlorperazine **OR** prochlorperazine, zinc oxide  Current Labs: Reviewed.  BMP Latest Ref Rng & Units 05/14/2017 05/12/2017 05/11/2017  Glucose 65 - 99 mg/dL 90 76 82  BUN 6 - 20 mg/dL 25(H) 50(H) 20  Creatinine 0.44 - 1.00 mg/dL 2.48(H) 3.32(H) 1.80(H)  Sodium 135 - 145 mmol/L 139 135 136  Potassium 3.5 - 5.1 mmol/L 3.9 3.7 4.1  Chloride 101 - 111 mmol/L 100(L) 97(L) 99(L)  CO2 22 - 32 mmol/L 29 27 28   Calcium 8.9 - 10.3 mg/dL 8.9 8.7(L) 8.2(L)  Phos: 4.8 (previously 6.9)  CBC CBC Latest Ref Rng & Units 05/14/2017 05/12/2017 05/11/2017  WBC 4.0 - 10.5 K/uL 13.7(H) 12.4(H) 10.9(H)  Hemoglobin 12.0 - 15.0 g/dL 9.4(L) 9.0(L) 9.2(L)  Hematocrit 36.0 - 46.0 % 30.0(L) 28.2(L) 29.1(L)  Platelets 150 - 400 K/uL 218 250 77   Background:  66 y/o F with Hx of CKD IIIa, HTN, hypothyroidism, HFrEF (LVEF 25-30%) and AFib (Currently on Apixaban) admitted 7/1 to [facility] with septic and cardiogenic shock with MODS (2/2 LLE cellulitis), including anuric AKI. Has been HD  dependent since early July. Received Vanc/zosyn & contrast exposure. Initially on CRRT however transitioned to [facility] MWF. Her hospital course was also complicated by PEA arrest, LUE DVT and persistent hypotension and remains on Midodrine. She is currently in physical rehab following her prolonged hospitalization. She may be producing some urine.   Assessment & Plan Ms. Stierwalt is a 66yo female recently admitted to [facility] with septic and cardiogenic shock with multiorgan failure including anuric AKI for which she was started on CRRT and transitioned to intermittent HD (MWF). Hospital course complicated by PEA arrest, UE DVT. She is now in physical rehabilitation following her month long hospitalization.  1. 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 Pt may be having scant UOP. OP HD set up for TTS w/ Late[facility]  at [place]. Will need to be transitioned to TTS by time of d/c. Will also need access placed towards end of her stay, don't want to place now and interfere with therapy. 2. Hypotension - On Midodrine for continued pressure support. BP stable. Continue. 3. Anemia - On aranesp q thurs however yet to receive. Iron stores are low (Fe 43 however sat 16), repleating via IV 250 mg x4 days, last dose scheduled 8/7. Hb today up to 9.4.  4. Hyperphosphatemia - Continue Renvela 2 ac. PTH 130 so no vit D needed currently 5. A fib - on diltiazem, amiodarone; eliquis 6. Hypothyroidism - on levothyroxine  Bethany Molt, DO 05/14/2017  I have seen and examined this patient and agree with plan and assessment in the above note with renal recommendations/interventions as noted. Tolerating HD and for HD today (will transition to TTS by time of discharge). Hb slow improvement, other labs stable. Making small amount urine, though pre HD labs to date have not supported any renal fx recovery (renal panel from pre HD today still pending)  Zacharie Portner B,MD 05/14/2017 5:15 PM

## 2017-05-14 NOTE — Progress Notes (Signed)
Occupational Therapy Session Note  Patient Details  Name: [name] MRN: [id number] Date of Birth: 1950-11-05  Today's Date: 05/14/2017 OT Individual Time: 0902-1000 and 1315-1345  OT Individual Time Calculation (min): 58 min and 30 min Missed 15 mins secondary to medical procedure   Short Term Goals: Week 1:  OT Short Term Goal 1 (Week 1): Pt will complete sit > stand in [place] with one person assist to decrease burden of care OT Short Term Goal 2 (Week 1): Pt will complete slide board transfer with max assist of one caregiver OT Short Term Goal 3 (Week 1): Pt will complete don pants with max assist at bed level  Skilled Therapeutic Interventions/Progress Updates:    1) Treatment session with focus on functional transfers, lateral scoots, and BUE strengthening.  Pt received in bed reporting already bathed and dressed.  Completed rolling at bed level with mod assist, therapist assisting with positioning LLE to assist with rolling.  Completed slide board transfer bed > w/c with +2 for safety with max-total assist for transfer.  Pt demonstrating improved lean and weight shift.  Grooming tasks completed seated at sink with use of BUE to apply toothpaste to toothbrush.  Engaged in [place] and ROM seated at edge of mat to challenge sitting balance.  Utilized zoom ball in sitting with focus on shoulder abduction and flexion, pt requiring mod-max assist due to weakness.  Engaged in table top puzzle in unsupported sitting with table elevated to lift arms ~80*.  Pt required mod cues for sequencing and problem solving during puzzle activity.  Slide board back to w/c with max assist and +2 for safety.  2) MD present completing debridement of Lt foot therefore missed 15 mins.  Engaged in [place] strengthening at bed level with level 1 theraband with hand over hand assist to facilitate movement as well as improve technique.  Pt demonstrating increased difficulty with sequencing of 1-2 step commands with  exercises and demonstrating decreased recall.  Pt would benefit from written and picture instructions to increase success with theraband exercises.    Therapy Documentation Precautions:  Precautions Precautions: Fall Restrictions Weight Bearing Restrictions: No Pain:   Pt with no c/o pain  See Function Navigator for Current Functional Status.   Therapy/Group: Individual Therapy  [name] 05/14/2017, 12:14 PM

## 2017-05-14 NOTE — Procedures (Signed)
After placing Topical Lidocaine in each of the wound bases for pain control wound, wounds 1,2,3,4  underwent sharp excisional debridement using #3 curette.  Removing devitalized, necrotic tissue going into/through subcutaneous and fat tissues until a some healthy tissue was exposed.  Bleeding was mild. Bleeding was controlled with pressure.  Complications were absent.   Debridement is medically necessary to promote wound healing.   Pre-Debridement Measurements   1. Foot  #1. 13.0 x 5.5 x 0.1  2. Tibia  #2. 1 x 0.5 x 0.1    #3. 1 x 2.5 x 0.2     #4. 3.5 x 3.0 x 0.1  Post-Debridement Measurements   #1. 13.1 x 5.6 x 0.3  #2. 1 x 0.5 x 0.3  #3. 1.2 x 2.5 x 0.3   #4. 3.7 x 3.0 x 0.3  Total Centimeters Squared Debrided = 26.38.

## 2017-05-14 NOTE — Progress Notes (Addendum)
Physical Therapy Note  Patient Details  Name: [name] MRN: [id number] Date of Birth: 1951/02/20 Today's Date: 05/14/2017    Time: 53 Pt missed 30 minutes skilled PT due to MD coming to debride Lt foot ulcer and RN performing prep.  Time 2: 1400-1438 38 minutes  1:1 No c/o pain.  Pt requests bed level exercises due to recent foot debridement.  Supine AAROM 2 x 15 saq, heel slides, hip abd/add, add squeeze, ankle pumps (Rt only), quad sets.  Ball taps 3 x 2 minutes for cardiovascular endurance. Then ball taps alternating Rt/Lt UE for UE strengthening.   [name] 05/14/2017, 11:35 AM

## 2017-05-15 ENCOUNTER — Inpatient Hospital Stay (HOSPITAL_COMMUNITY): Payer: Medicare Other

## 2017-05-15 ENCOUNTER — Inpatient Hospital Stay (HOSPITAL_COMMUNITY): Payer: Medicare Other | Admitting: Occupational Therapy

## 2017-05-15 ENCOUNTER — Inpatient Hospital Stay (HOSPITAL_COMMUNITY): Payer: Medicare Other | Admitting: Physical Therapy

## 2017-05-15 DIAGNOSIS — Z992 Dependence on renal dialysis: Secondary | ICD-10-CM

## 2017-05-15 DIAGNOSIS — N939 Abnormal uterine and vaginal bleeding, unspecified: Secondary | ICD-10-CM

## 2017-05-15 DIAGNOSIS — L97522 Non-pressure chronic ulcer of other part of left foot with fat layer exposed: Secondary | ICD-10-CM

## 2017-05-15 DIAGNOSIS — L97922 Non-pressure chronic ulcer of unspecified part of left lower leg with fat layer exposed: Secondary | ICD-10-CM | POA: Insufficient documentation

## 2017-05-15 LAB — CBC
HEMATOCRIT: 35.3 % — AB (ref 36.0–46.0)
Hemoglobin: 10.8 g/dL — ABNORMAL LOW (ref 12.0–15.0)
MCH: 31.3 pg (ref 26.0–34.0)
MCHC: 30.6 g/dL (ref 30.0–36.0)
MCV: 102.3 fL — AB (ref 78.0–100.0)
PLATELETS: 241 10*3/uL (ref 150–400)
RBC: 3.45 MIL/uL — AB (ref 3.87–5.11)
RDW: 22.7 % — AB (ref 11.5–15.5)
WBC: 17.9 10*3/uL — AB (ref 4.0–10.5)

## 2017-05-15 LAB — GLUCOSE, CAPILLARY
GLUCOSE-CAPILLARY: 144 mg/dL — AB (ref 65–99)
GLUCOSE-CAPILLARY: 125 mg/dL — AB (ref 65–99)
Glucose-Capillary: 246 mg/dL — ABNORMAL HIGH (ref 65–99)
Glucose-Capillary: 102 mg/dL — ABNORMAL HIGH (ref 65–99)

## 2017-05-15 MED ORDER — DARBEPOETIN ALFA 60 MCG/0.3ML IJ SOSY
60.0000 ug | PREFILLED_SYRINGE | INTRAMUSCULAR | Status: DC
  Administered 2017-05-16 – 2017-05-26 (×3): 60 ug via INTRAVENOUS
  Filled 2017-05-15 (×2): qty 0.3

## 2017-05-15 MED ORDER — SEVELAMER CARBONATE 800 MG PO TABS
800.0000 mg | ORAL_TABLET | Freq: Three times a day (TID) | ORAL | Status: DC
  Administered 2017-05-15 – 2017-05-16 (×5): 800 mg via ORAL
  Filled 2017-05-15 (×5): qty 1

## 2017-05-15 NOTE — Progress Notes (Signed)
Recreational Therapy Session Note  Patient Details  Name: [name] MRN: [id number] Date of Birth: 01/06/1951 Today's Date: 05/15/2017  Pain: no c/o Skilled Therapeutic Interventions/Progress Updates: Session focused on activity tolerance, slide board transfers, standing tolerance, LE weightbearing during co-treat with PT.  Pt performed slide board transfers with Max assist between level surfaces and +2 assist for slide board transfer to & from low couch in ADL apartment.  Pt performed sit-stands using Stedy with Max assist.  While standing, pt wrote out a menu and ingredient list for meal prep activity. Pt excited about potential meal prep activity in the kitchen during LOS.   Therapy/Group: Co-Treatment   Sharnelle Cappelli 05/15/2017, 3:29 PM

## 2017-05-15 NOTE — Progress Notes (Signed)
[facility] Rounding Note Date of Service 05/15/17 Length of stay: 8 days  SUBJECTIVE   Brief HPI: 66 y/o F with Hx of CKD IIIa now ESRD on iHD MWF, HTN, hypothyroidism, HFrEF (LVEF 25-30%) and AFib (Currently on Apixaban) admitted 7/1 to [place] with septic and cardiogenic shock with MODS (2/2 LLE cellulitis), including anuric AKI. Her hospital course was also complicated by PEA arrest, LUE DVT and persistent hypotension and remains on Midodrine. Currently in physical rehab following her prolonged hospitalization.   Interval events:  Feels well and is without complaint. Working with PT/OT to improve weakness. Continues to endorse small volume UOP. 47mLs recorded yesterday. Had uneventful HD yesterday.    Physical Exam   VITALS: Blood pressure 112/86, pulse 100, temperature (!) 97.5 F (36.4 C), temperature source Oral, resp. rate 16, height 5\' 5"  (1.651 m), weight 163 lb 2.3 oz (74 kg), SpO2 100 %. GEN: Alert and oriented, pleasant and conversant. Right IJ permacath (placed 04/26/17) CV: IRIR, rate about 110. Diastolic murmur PULM: CTAB, able to speak in complete sentences. Normal WOB on RA.  ABD: Soft, nontender. Not distended. SKIN: No rash. LLE with clean dry bandages.  EXT: Min LE edema.  Current Labs:  BMP Latest Ref Rng & Units 05/14/2017 05/12/2017 05/11/2017  Glucose 65 - 99 mg/dL 90 76 82  BUN 6 - 20 mg/dL 25(H) 50(H) 20  Creatinine 0.44 - 1.00 mg/dL 2.48(H) 3.32(H) 1.80(H)  Sodium 135 - 145 mmol/L 139 135 136  Potassium 3.5 - 5.1 mmol/L 3.9 3.7 4.1  Chloride 101 - 111 mmol/L 100(L) 97(L) 99(L)  CO2 22 - 32 mmol/L 29 27 28   Calcium 8.9 - 10.3 mg/dL 8.9 8.7(L) 8.2(L)  Phos 2.2, was 7.8 04/30/17  CBC CBC Latest Ref Rng & Units 05/14/2017 05/12/2017 05/11/2017  WBC 4.0 - 10.5 K/uL 13.7(H) 12.4(H) 10.9(H)  Hemoglobin 12.0 - 15.0 g/dL 9.4(L) 9.0(L) 9.2(L)  Hematocrit 36.0 - 46.0 % 30.0(L) 28.2(L) 29.1(L)  Platelets 150 - 400 K/uL 218 250 280   Current IP Medications . amiodarone  200 mg Oral  BID  . apixaban  2.5 mg Oral BID  . collagenase   Topical Daily  . [START ON 05/16/2017] darbepoetin (ARANESP) injection - DIALYSIS  60 mcg Intravenous Q Wed-HD  . diltiazem  120 mg Oral Daily  . famotidine  20 mg Oral QHS  . feeding supplement (NEPRO CARB STEADY)  237 mL Oral Q1500  . feeding supplement (PRO-STAT SUGAR FREE 64)  30 mL Oral BID  . levothyroxine  100 mcg Oral QAC breakfast  . midodrine  5 mg Oral TID WC  . multivitamin  1 tablet Oral QHS  . QUEtiapine  25 mg Oral QHS  . senna-docusate  2 tablet Oral QHS  . sertraline  25 mg Oral QHS  . sevelamer carbonate  1,600 mg Oral TID WC   Continuous Infusion Medications: None . ferric gluconate (FERRLECIT/NULECIT) IV Stopped (05/14/17 2029)   PRN Medications:  acetaminophen, aluminum hydroxide, artificial tears, bisacodyl, diphenhydrAMINE, guaiFENesin-dextromethorphan, ipratropium-albuterol, ondansetron (ZOFRAN) IV, phenol, polyethylene glycol, prochlorperazine **OR** prochlorperazine **OR** prochlorperazine, zinc oxide  Assessment & Plan: Ms. Emami is a 66yo female recently admitted to [facility] with septic and cardiogenic shock with multiorgan failure including anuric AKI for which she was started on CRRT and transitioned to intermittent HD (MWF). She remains HD dependent however now producing small volumes of urine. She is now in physical rehabilitation following her month long hospitalization.   1. 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 Will need to touch base with [facility].  Pt having small volume UOP. Will need access placed towards end of her stay, don't want to place now and interfere with therapy. Will order vein mapping today.  2. Hypotension - On Midodrine for continued pressure support. BP stable. Continue. 3. Anemia - On aranesp q thurs however yet to receive. Iron stores are low and were repleted IV. 4. Hyperphosphatemia - Phos 2.2  yesterday. Decrease Renvela to 800mg  TID WC. PTH 130 so no vit D needed currently 5. A fib - on diltiazem, amiodarone; eliquis 6. Hypothyroidism - on levothyroxine  Einar Gip, DO Internal Medicine - PGY2 05/15/2017   I have seen and examined this patient and agree with plan and assessment in the above note with renal recommendations/intervention highlighted. Doing MWF HD inpt (am told was CLIPPED to [place] unit for TTS prior to transfer from [facility] - will have to clarify if they have a MWF spot). No permanent access yet so as not to interfere with her rehab.  Elaijah Munoz B,MD 05/15/2017 12:58 PM

## 2017-05-15 NOTE — Progress Notes (Addendum)
Chart reviewed--note that patient had CT abdomen/pelvis in June that was negative for bladder, uterine or adnexal abnormality. Discussed with Dr. Ginette Pitman who reported that he had seen patient for the first time and no recorded history of GU or GYN abnormality/bleeding--might be side effect of Eliquis.  Will check UA/UCS and monitor. If recurrs may need GYN consult. Monitor H/H

## 2017-05-15 NOTE — Progress Notes (Signed)
Physical Therapy Session Note  Patient Details  Name: [name] MRN: [id number] Date of Birth: Dec 16, 1950  Today's Date: 05/15/2017 PT Individual Time: 1420-1446 PT Individual Time Calculation (min): 26 min   Short Term Goals: Week 2:  PT Short Term Goal 1 (Week 2): pt will perform sit to stand with max A PT Short Term Goal 2 (Week 2): pt will consistently perform transfers with +1 max A  Skilled Therapeutic Interventions/Progress Updates:   Pt reporting overall fatigue from today and declines any standing activities. Agreeable to seated therex at this time. Engaged in seated LE therex for functional strengthening and ROM including LAQ with 5 second hold x 10 reps x 2 sets BLE, heel/toe raises x 15 reps each x 2 sets BLE, active assisted marches x 10 reps x 2 sets BLE, and isometric hip abduction/adduction with 5 second hold x 10 reps x 2 sets each. Slideboard transfer back to bed with +2 for stabilization of w/c and overall min assist for transfer with pt demonstrating improved anterior weightshift and ability to complete transfer with decreased assist. Mod assist to return to supine and all needs in reach.   Therapy Documentation Precautions:  Precautions Precautions: Fall Restrictions Weight Bearing Restrictions: No General: PT Amount of Missed Time (min): 4 Minutes PT Missed Treatment Reason: Nursing care (labs drawn) Pain:  No reports of pain  See Function Navigator for Current Functional Status.   Therapy/Group: Individual Therapy  Canary Brim Ivory Broad, PT, DPT  05/15/2017, 2:47 PM

## 2017-05-15 NOTE — Progress Notes (Signed)
[place] PHYSICAL MEDICINE & REHABILITATION     PROGRESS NOTE  Subjective/Complaints:  Pt seen laying in bed this AM.  She slept well overnight and states she is doing well.   ROS: Denies nausea, vomiting, diarrhea, shortness of breath or chest pain   Objective: Vital Signs: Blood pressure 131/84, pulse (!) 109, temperature 98 F (36.7 C), temperature source Oral, resp. rate 18, height 5\' 5"  (1.651 m), weight 67 kg (147 lb 11.3 oz), SpO2 100 %. No results found.  Recent Labs  05/12/17 2315 05/14/17 1548  WBC 12.4* 13.7*  HGB 9.0* 9.4*  HCT 28.2* 30.0*  PLT 250 218    Recent Labs  05/12/17 2315 05/14/17 1549  NA 135 139  K 3.7 3.9  CL 97* 100*  GLUCOSE 76 90  BUN 50* 25*  CREATININE 3.32* 2.48*  CALCIUM 8.7* 8.9   CBG (last 3)   Recent Labs  05/14/17 2044 05/14/17 2136 05/15/17 0639  GLUCAP 69 77 246*    Wt Readings from Last 3 Encounters:  05/15/17 67 kg (147 lb 11.3 oz)  05/07/17 75.3 kg (166 lb 0.1 oz)    Physical Exam:  BP 131/84 (BP Location: Right Arm)   Pulse (!) 109   Temp 98 F (36.7 C) (Oral)   Resp 18   Ht 5\' 5"  (1.651 m)   Wt 67 kg (147 lb 11.3 oz)   SpO2 100%   BMI 24.58 kg/m  Constitutional: She appears well-developed and well-nourished.  HENT: Normocephalic. Atraumatic. Eyes: EOMI. No discharge.  Cardiovascular: IRIR Respiratory: CTA Bilaterally. Normal effort .  GI: Soft. Bowel sounds are normal.   Musculoskeletal: She exhibits edema and tenderness.  Neurological: She is alert and oriented.  Able to follow commands without difficulty.  Motor: B/l UE 4/5 proximal to distal B/l LE: HF 2/5, KE 2+/5, ADF/PF 3/5 (unchanged) Skin: LLE with ulcerations with eschar on dorsum of foot and distal tibia with dressings in place. Psychiatric: She has a normal mood and affect. Her behavior is normal. Thought content normal.    Assessment/Plan: 1. 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 Physiatrist is providing close team supervision and 24 hour management of active medical problems listed below. Physiatrist and rehab team continue to assess barriers to discharge/monitor patient progress toward functional and medical goals.  Function:  Bathing Bathing position   Position: Bed  Bathing parts Body parts bathed by patient: Right arm, Left arm, Chest, Abdomen, Front perineal area, Right upper leg, Left upper leg Body parts bathed by helper: Front perineal area, Buttocks, Back  Bathing assist Assist Level: Touching or steadying assistance(Pt > 75%)      Upper Body Dressing/Undressing Upper body dressing   What is the patient wearing?: Pull over shirt/dress     Pull over shirt/dress - Perfomed by patient: Put head through opening, Thread/unthread left sleeve, Pull shirt over trunk Pull over shirt/dress - Perfomed by helper: Put head through opening, Pull shirt over trunk        Upper body assist Assist Level: Touching or steadying assistance(Pt > 75%)      Lower Body Dressing/Undressing Lower body dressing   What is the patient wearing?: Pants     Pants- Performed by patient: Pull pants up/down Pants- Performed by helper: Thread/unthread right pants leg, Thread/unthread left pants leg, Pull pants up/down   Non-skid slipper socks- Performed by helper: Don/doff right sock, Don/doff left sock  Lower body assist Assist for lower body dressing: Touching or steadying assistance (Pt > 75%)      Toileting Toileting Toileting activity did not occur: N/A (anuric, no bm, no toileting performed)   Toileting steps completed by helper: Adjust clothing prior to toileting, Performs perineal hygiene, Adjust clothing after toileting    Toileting assist Assist level: Two helpers   Transfers Chair/bed transfer     Chair/bed transfer assist level: 2 helpers Chair/bed transfer assistive device:  Mechanical lift Mechanical lift: Ecologist Ambulation activity did not occur: Safety/medical concerns         Wheelchair   Type: Manual Max wheelchair distance: 10' Assist Level: Touching or steadying assistance (Pt > 75%)  Cognition Comprehension Comprehension assist level: Follows basic conversation/direction with no assist  Expression Expression assist level: Expresses basic needs/ideas: With no assist  Social Interaction Social Interaction assist level: Interacts appropriately with others - No medications needed.  Problem Solving Problem solving assist level: Solves complex problems: With extra time  Memory Memory assist level: More than reasonable amount of time    Medical Problem List and Plan: 1.  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  DVT Prophylaxis/Anticoagulation: Pharmaceutical: Other (comment)--Eliquis 3. Pain Management: tylenol prn 4. Mood: LCSW to follow for evaluation and support.  5. Neuropsych: This patient is capable of making decisions on her own behalf. 6. Skin/Wound Care: routine pressure relief measures. Prevalon boot for heel ulcer.  7. Fluids/Electrolytes/Nutrition: Strict I/Os.   8.  Hypotension: Monitor orthostatic BP. Added abdominal binder and TEDs to help with BP support.    Vitals:   05/14/17 1952 05/15/17 0300  BP: 113/73 131/84  Pulse: (!) 129 (!) 109  Resp: 18 18  Temp: 98.2 F (36.8 C) 98 F (36.7 C)   9. Paroxysmal Afib:  on eliquis for stroke prevention. Continue amiodarone bid and Cardizem daily.  10. Leukocytosis:              Patient with ulcers--continue with WOC recs/daily dressing             Afebrile             WBCs 13.7 on 8/7             Cont to monitor 11. Anemia of chronic disease: On epogen  MWF.              Hb 9.4 on 8/7             Cont to montior 12. Left foot/leg ulcer: Daily dressing changes with santyl. Monitor for signs of infection.    Debrided 8/6 13. Anxiety/depression: On Seroquel and Zoloft at nights.  14. Prediabetes: CBG (last 3)   Recent Labs  05/14/17 2044 05/14/17 2136 05/15/17 0639  GLUCAP 69 77 246*   Extremely labile with Hypoglycemia/hyperglycemia overnight 15. CKD now HD dependent: HD on MWF at end of day to help with tolerance of therapy. Renal diet--Reducate patient on appropriate diet.   LOS (Days) 8 A FACE TO FACE EVALUATION WAS PERFORMED  [name] Lorie Phenix 05/15/2017 8:42 AM

## 2017-05-15 NOTE — Progress Notes (Signed)
Physical Therapy Note  Patient Details  Name: [name] MRN: [id number] Date of Birth: [birth year] Today's Date: 05/15/2017    Time: 1300-1345 45 minutes Co-tx with TR  1:1 No c/o pain.  Pt performed sliding board transfers throughout session with max A on level surfaces.  Furniture transfer to low couch with +2 assist.  Standing in stedy with max/total A.  Sit to stand from stedy with max A x 4 attempts.  Standing in stedy pt wrote menu/grocery list for a dish she likes to cook.  Pt shown kitchen and pt states she would love to try cooking.   Annalaura Sauseda 05/15/2017, 1:46 PM

## 2017-05-15 NOTE — Progress Notes (Signed)
Nutrition Follow-up  DOCUMENTATION CODES:   Not applicable  INTERVENTION:  Continue 30 ml Prostat po BID, each supplement provides 100 kcal and 15 grams of protein.   Continue Nepro Shake po once daily, each supplement provides 425 kcal and 19 grams protein  Encourage adequate PO intake.   NUTRITION DIAGNOSIS:   Increased nutrient needs related to chronic illness as evidenced by estimated needs; ongoing  GOAL:   Patient will meet greater than or equal to 90% of their needs; met  MONITOR:   PO intake, Supplement acceptance, Labs, Weight trends, Skin, I & O's  REASON FOR ASSESSMENT:   Consult Diet education  ASSESSMENT:   66 y.o. female with history of HTN, A fib-on pradaxa who was admitted on [facility] on 04/08/17 due to BLE cellulitis with difficulty walking. She was found to be septic with leucocytosis and hypotensive. CRRT initiated and  hospital course significant for PEA arrest 7/4, ARDS, anuria requiring  IJ placement by vascular surgery as now HD dependent, shocked liver with abnormal LFTs, thrombocytopenia, ongoing issues with hypotension and tachycardia. 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  Meal completion has been varied from 10-100% with most recent po intake at 60%. Pt currently has Nepro shake and Prostat ordered and has been consuming them. RD to continue with current orders to aid in adequate nutrition. Labs and medications reviewed.   Diet Order:  Diet renal with fluid restriction Fluid restriction: 1200 mL Fluid; Room service appropriate? Yes; Fluid consistency: Thin  Skin:   (Non pressure wound on buttocks)  Last BM:  8/5  Height:   Ht Readings from Last 1 Encounters:  05/07/17 '5\' 5"'  (1.651 m)    Weight:   Wt Readings from Last 1 Encounters:  05/15/17 147 lb 11.3 oz (67 kg)    Ideal Body Weight:  56.8 kg  BMI:  Body mass index is 24.58 kg/m.  Estimated Nutritional Needs:   Kcal:   1800-2000  Protein:  90-110 grams  Fluid:  1.2 L/day  EDUCATION NEEDS:   Education needs addressed  Corrin Parker, MS, RD, LDN Pager # [phone] After hours/ weekend pager # [phone]

## 2017-05-15 NOTE — Progress Notes (Signed)
Physical Therapy Weekly Progress Note  Patient Details  Name: [name] MRN: [id number] Date of Birth: 09-06-1951  Beginning of progress report period: May 08, 2017 End of progress report period: May 15, 2017   Patient has met 0 of 2 short term goals.  Pt is making slow progress towards goals. Pt continues to occasionally need +2 assist for sliding board transfers due to weakness. Pt has improved activity tolerance and lessened anxiety with mobility. Pt with good participation in therapy and is tolerating standing in stedy and standing frame.  Patient continues to demonstrate the following deficits muscle weakness, decreased cardiorespiratoy endurance and decreased standing balance, decreased postural control and decreased balance strategies and therefore will continue to benefit from skilled PT intervention to increase functional independence with mobility.  Patient progressing toward long term goals..  Continue plan of care.  PT Short Term Goals Week 1:  PT Short Term Goal 1 (Week 1): pt will perform functional transfers wtih max A PT Short Term Goal 1 - Progress (Week 1): Progressing toward goal PT Short Term Goal 2 (Week 1): pt will perform sit to stand with max A PT Short Term Goal 2 - Progress (Week 1): Not met Week 2:  PT Short Term Goal 1 (Week 2): pt will perform sit to stand with max A PT Short Term Goal 2 (Week 2): pt will consistently perform transfers with +1 max A  Skilled Therapeutic Interventions/Progress Updates:  Ambulation/gait training;Discharge planning;DME/adaptive equipment instruction;Functional mobility training;Splinting/orthotics;Therapeutic Activities;UE/LE Strength taining/ROM;Pain management;Wheelchair propulsion/positioning;UE/LE Coordination activities;Therapeutic Exercise;Stair training;Patient/family education;Neuromuscular re-education;Functional electrical stimulation;Community reintegration;Balance/vestibular training    See Function Navigator for  Current Functional Status.   Tukker Byrns 05/15/2017, 8:25 AM

## 2017-05-15 NOTE — Progress Notes (Signed)
Physical Therapy Session Note  Patient Details  Name: Mikalia Fessel MRN: [id number] Date of Birth: 03-Sep-1951  Today's Date: 05/15/2017 PT Individual Time: 0830-0900 PT Individual Time Calculation (min): 30 min   Short Term Goals: Week 2:  PT Short Term Goal 1 (Week 2): pt will perform sit to stand with max A PT Short Term Goal 2 (Week 2): pt will consistently perform transfers with +1 max A  Skilled Therapeutic Interventions/Progress Updates:  Functional bed mobility retraining with emphasis on pt working to advance LE's off the EOB as much as possible  (still requires mod assist) and min assist for facilitation at trunk for propped on elbow to sit. Slideboard transfer with max assist (+2 required to stabilize w/c and board) with cues for anterior weightshift and hand placement to complete. Seated at sink engaged in functional dynamic sitting balance and core activation to lean forward to perform oral and facial hygiene with set up assist. Assist for donning and doffing shirt seated in w/c with cues for technique and anterior weightshift.   Therapy Documentation Precautions:  Precautions Precautions: Fall Restrictions Weight Bearing Restrictions: No  Pain:  Denies pain.    See Function Navigator for Current Functional Status.   Therapy/Group: Individual Therapy  Canary Brim Ivory Broad, PT, DPT  05/15/2017, 12:05 PM

## 2017-05-15 NOTE — Progress Notes (Signed)
Occupational Therapy Note  Patient Details  Name: [name] MRN: [id number] Date of Birth: 1951/07/31  Today's Date: 05/15/2017 OT Missed Time: 54 Minutes Missed Time Reason: Unavailable (comment) (medical team present in room consulting with pt)  Pt missed 30 mins skilled OT services.  Medical team consulting with pt and pt unavailable for therapy.   Leotis Shames [facility] 05/15/2017, 11:46 AM

## 2017-05-15 NOTE — Progress Notes (Signed)
Brief Note:  Please see previous note. Vaginal bleeding noted. Workup initiated.

## 2017-05-15 NOTE — Progress Notes (Signed)
Occupational Therapy Weekly Progress Note  Patient Details  Name: Breda Bond MRN: [id number] Date of Birth: Nov 05, 1950  Beginning of progress report period: May 08, 2017 End of progress report period: May 15, 2017  Today's Date: 05/15/2017 OT Individual Time: 1000-1100 OT Individual Time Calculation (min): 60 min    Patient has met 1 of 3 short term goals.  Pt is making slow progress towards goals.  Pt is able to move legs laterally at bed level and is beginning some anti-gravity movement to assist with bed mobility and LB dressing at bed level.  Pt continues to require total assist with LB bathing and dressing as well as +2 for sit > stand with use of lift equipment.  Pt able to complete lateral scoot with slide board with max assist.  Patient continues to demonstrate the following deficits: muscle weakness, decreased cardiorespiratoy endurance and decreased standing balance and decreased balance strategies and therefore will continue to benefit from skilled OT intervention to enhance overall performance with BADL and Reduce care partner burden.  Patient progressing toward long term goals..  Continue plan of care.  OT Short Term Goals Week 1:  OT Short Term Goal 1 (Week 1): Pt will complete sit > stand in [place] with one person assist to decrease burden of care OT Short Term Goal 1 - Progress (Week 1): Progressing toward goal OT Short Term Goal 2 (Week 1): Pt will complete slide board transfer with max assist of one caregiver OT Short Term Goal 2 - Progress (Week 1): Met OT Short Term Goal 3 (Week 1): Pt will complete don pants with max assist at bed level OT Short Term Goal 3 - Progress (Week 1): Progressing toward goal Week 2:  OT Short Term Goal 1 (Week 2): Pt will complete sit > stand in [place] with one person assist to decrease burden of care OT Short Term Goal 2 (Week 2): Pt will don pants with max assist at bed level utilizing AE PRN and circle sitting technique OT Short Term Goal  3 (Week 2): Pt will don shirt with supervision in unsupported sitting on EOB OT Short Term Goal 4 (Week 2): Pt will complete slide board transfer with mod assist of one caregiver  Skilled Therapeutic Interventions/Progress Updates:    Treatment session with focus on functional transfers and LB dressing.  Pt received upright in w/c reporting concerns with lack of progress.  Discussed weekly goals with plan to reassess today with pt willing to participate.  Completed slide board transfer w/c > bed with max assist with pt demonstrating increased weight shift to assist with transfer.  Educated on circle sitting at bed level with min assist to maintain upright sitting balance in bed as well as position legs to allow pt to attempt to thread pant legs.  Pt able to participate in all aspects of LB dressing but unable to completely thread pants or pull pants over hips in sidelying.  Able to advance BLE to EOB with increased time, mod assist to come up to sitting at EOB.  Utilized Stedy for sit > stand from EOB with pt requiring +2.  Able to achieve stand from [facility] seat with max assist of one caregiver and max cues for sequencing of weight shift.  Therapist with concern with pt ability to recall and follow directions during mobility tasks therefore issued SAGE with pt scoring 9/22.  Plan to continue to assess and reconsult SLP if necessary.  Therapy Documentation Precautions:  Precautions Precautions: Fall Restrictions Weight  Bearing Restrictions: No General:   Vital Signs:  Pain:   ADL:   Vision   Perception    Praxis   Exercises:   Other Treatments:    See Function Navigator for Current Functional Status.   Therapy/Group: Individual Therapy  Kshawn Canal, [place] 05/15/2017, 8:08 AM

## 2017-05-16 ENCOUNTER — Encounter (HOSPITAL_COMMUNITY): Payer: Medicare Other

## 2017-05-16 ENCOUNTER — Inpatient Hospital Stay (HOSPITAL_COMMUNITY): Payer: Medicare Other

## 2017-05-16 ENCOUNTER — Inpatient Hospital Stay (HOSPITAL_COMMUNITY): Payer: Medicare Other | Admitting: Occupational Therapy

## 2017-05-16 ENCOUNTER — Inpatient Hospital Stay (HOSPITAL_COMMUNITY): Payer: Medicare Other | Admitting: Physical Therapy

## 2017-05-16 DIAGNOSIS — R8290 Unspecified abnormal findings in urine: Secondary | ICD-10-CM | POA: Insufficient documentation

## 2017-05-16 LAB — RENAL FUNCTION PANEL
ALBUMIN: 3.6 g/dL (ref 3.5–5.0)
ALBUMIN: 3.2 g/dL — AB (ref 3.5–5.0)
ANION GAP: 11 (ref 5–15)
Anion gap: 12 (ref 5–15)
BUN: 28 mg/dL — ABNORMAL HIGH (ref 6–20)
BUN: 29 mg/dL — AB (ref 6–20)
CALCIUM: 9.3 mg/dL (ref 8.9–10.3)
CALCIUM: 8.8 mg/dL — AB (ref 8.9–10.3)
CO2: 27 mmol/L (ref 22–32)
CO2: 26 mmol/L (ref 22–32)
CREATININE: 2.67 mg/dL — AB (ref 0.44–1.00)
Chloride: 99 mmol/L — ABNORMAL LOW (ref 101–111)
Chloride: 97 mmol/L — ABNORMAL LOW (ref 101–111)
Creatinine, Ser: 2.45 mg/dL — ABNORMAL HIGH (ref 0.44–1.00)
GFR calc Af Amer: 20 mL/min — ABNORMAL LOW (ref >60)
GFR, EST AFRICAN AMERICAN: 23 mL/min — AB (ref >60)
GFR, EST NON AFRICAN AMERICAN: 20 mL/min — AB (ref >60)
GFR, EST NON AFRICAN AMERICAN: 18 mL/min — AB (ref >60)
GLUCOSE: 80 mg/dL (ref 65–99)
Glucose, Bld: 94 mg/dL (ref 65–99)
PHOSPHORUS: 2 mg/dL — AB (ref 2.5–4.6)
PHOSPHORUS: 2.2 mg/dL — AB (ref 2.5–4.6)
POTASSIUM: 4.3 mmol/L (ref 3.5–5.1)
POTASSIUM: 3.7 mmol/L (ref 3.5–5.1)
SODIUM: 137 mmol/L (ref 135–145)
SODIUM: 135 mmol/L (ref 135–145)

## 2017-05-16 LAB — URINALYSIS, ROUTINE W REFLEX MICROSCOPIC
Bilirubin Urine: NEGATIVE
GLUCOSE, UA: NEGATIVE mg/dL
Ketones, ur: NEGATIVE mg/dL
NITRITE: NEGATIVE
PH: 7 (ref 5.0–8.0)
Protein, ur: 100 mg/dL — AB
SPECIFIC GRAVITY, URINE: 1.012 (ref 1.005–1.030)

## 2017-05-16 LAB — GLUCOSE, CAPILLARY
GLUCOSE-CAPILLARY: 85 mg/dL (ref 65–99)
GLUCOSE-CAPILLARY: 87 mg/dL (ref 65–99)
Glucose-Capillary: 80 mg/dL (ref 65–99)

## 2017-05-16 LAB — CBC
HEMATOCRIT: 28.5 % — AB (ref 36.0–46.0)
HEMOGLOBIN: 8.8 g/dL — AB (ref 12.0–15.0)
MCH: 31.2 pg (ref 26.0–34.0)
MCHC: 30.9 g/dL (ref 30.0–36.0)
MCV: 101.1 fL — ABNORMAL HIGH (ref 78.0–100.0)
Platelets: 195 10*3/uL (ref 150–400)
RBC: 2.82 MIL/uL — ABNORMAL LOW (ref 3.87–5.11)
RDW: 22.1 % — AB (ref 11.5–15.5)
WBC: 13.7 10*3/uL — AB (ref 4.0–10.5)

## 2017-05-16 MED ORDER — MEGESTROL ACETATE 20 MG PO TABS
40.0000 mg | ORAL_TABLET | Freq: Two times a day (BID) | ORAL | Status: DC
  Administered 2017-05-17 – 2017-06-02 (×33): 40 mg via ORAL
  Filled 2017-05-16 (×32): qty 2

## 2017-05-16 MED ORDER — HEPARIN SODIUM (PORCINE) 1000 UNIT/ML DIALYSIS
20.0000 [IU]/kg | INTRAMUSCULAR | Status: DC | PRN
  Administered 2017-05-16: 1400 [IU] via INTRAVENOUS_CENTRAL
  Filled 2017-05-16 (×2): qty 2

## 2017-05-16 MED ORDER — DARBEPOETIN ALFA 60 MCG/0.3ML IJ SOSY
PREFILLED_SYRINGE | INTRAMUSCULAR | Status: AC
  Administered 2017-05-16: 60 ug via INTRAVENOUS
  Filled 2017-05-16: qty 0.3

## 2017-05-16 MED ORDER — CEPHALEXIN 250 MG PO CAPS
250.0000 mg | ORAL_CAPSULE | Freq: Two times a day (BID) | ORAL | Status: DC
  Administered 2017-05-16 – 2017-05-18 (×4): 250 mg via ORAL
  Filled 2017-05-16 (×4): qty 1

## 2017-05-16 NOTE — Progress Notes (Signed)
HD tx initiated via HD cath w/o problem, pull/push/flush equally w/o problem, VSS, will cont to monitor while on HD tx 

## 2017-05-16 NOTE — Progress Notes (Signed)
[place] PHYSICAL MEDICINE & REHABILITATION     PROGRESS NOTE  Subjective/Complaints:  Pt seen laying in bed this AM.  She slept well overnight.  She denies further vaginal bleeding.    ROS: Denies nausea, vomiting, diarrhea, shortness of breath or chest pain   Objective: Vital Signs: Blood pressure 103/64, pulse (!) 58, temperature 97.6 F (36.4 C), temperature source Oral, resp. rate 17, height 5\' 5"  (1.651 m), weight 68.4 kg (150 lb 12.7 oz), SpO2 100 %. No results found.  Recent Labs  05/14/17 1548 05/15/17 1411  WBC 13.7* 17.9*  HGB 9.4* 10.8*  HCT 30.0* 35.3*  PLT 218 241    Recent Labs  05/14/17 1549  NA 139  K 3.9  CL 100*  GLUCOSE 90  BUN 25*  CREATININE 2.48*  CALCIUM 8.9   CBG (last 3)   Recent Labs  05/15/17 1635 05/15/17 2205 05/16/17 0644  GLUCAP 102* 125* 80    Wt Readings from Last 3 Encounters:  05/16/17 68.4 kg (150 lb 12.7 oz)  05/07/17 75.3 kg (166 lb 0.1 oz)    Physical Exam:  BP 103/64 (BP Location: Left Arm)   Pulse (!) 58   Temp 97.6 F (36.4 C) (Oral)   Resp 17   Ht 5\' 5"  (1.651 m)   Wt 68.4 kg (150 lb 12.7 oz)   SpO2 100%   BMI 25.09 kg/m  Constitutional: She appears well-developed and well-nourished.  HENT: Normocephalic. Atraumatic. Eyes: EOMI. No discharge.  Cardiovascular: IRIR Respiratory: CTA Bilaterally. Normal effort .  GI: Soft. Bowel sounds are normal.   Musculoskeletal: She exhibits edema and tenderness.  Neurological: She is alert and oriented.  Able to follow commands without difficulty.  Motor: B/l UE 4/5 proximal to distal B/l LE: HF 2/5, KE 2+/5, ADF/PF 3/5 (stable) Skin: LLE with ulcerations with eschar on dorsum of foot and distal tibia with dressings in place. Psychiatric: She has a normal mood and affect. Her behavior is normal. Thought content normal.    Assessment/Plan: 1. 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 Physiatrist is providing close team supervision and 24 hour management of active medical problems listed below. Physiatrist and rehab team continue to assess barriers to discharge/monitor patient progress toward functional and medical goals.  Function:  Bathing Bathing position   Position: Bed  Bathing parts Body parts bathed by patient: Right arm, Left arm, Chest, Abdomen, Front perineal area, Right upper leg, Left upper leg Body parts bathed by helper: Front perineal area, Buttocks, Back  Bathing assist Assist Level: Touching or steadying assistance(Pt > 75%)      Upper Body Dressing/Undressing Upper body dressing   What is the patient wearing?: Pull over shirt/dress     Pull over shirt/dress - Perfomed by patient: Put head through opening, Thread/unthread left sleeve, Pull shirt over trunk Pull over shirt/dress - Perfomed by helper: Put head through opening, Pull shirt over trunk        Upper body assist Assist Level: Touching or steadying assistance(Pt > 75%)      Lower Body Dressing/Undressing Lower body dressing   What is the patient wearing?: Pants     Pants- Performed by patient: Pull pants up/down Pants- Performed by helper: Thread/unthread right pants leg, Thread/unthread left pants leg, Pull pants up/down   Non-skid slipper socks- Performed by helper: Don/doff right sock  Lower body assist Assist for lower body dressing: Touching or steadying assistance (Pt > 75%) (Total assist)      Toileting Toileting Toileting activity did not occur: N/A (anuric, no bm, no toileting performed)   Toileting steps completed by helper: Performs perineal hygiene, Adjust clothing after toileting, Adjust clothing prior to toileting    Toileting assist Assist level: Two helpers   Transfers Chair/bed transfer   Chair/bed transfer method: Lateral scoot Chair/bed transfer assist level: Maximal assist (Pt 25 - 49%/lift and  lower) Chair/bed transfer assistive device: Sliding board Mechanical lift: Stedy   Locomotion Ambulation Ambulation activity did not occur: Safety/medical concerns         Wheelchair   Type: Manual Max wheelchair distance: 10' Assist Level: Touching or steadying assistance (Pt > 75%)  Cognition Comprehension Comprehension assist level: Follows basic conversation/direction with no assist  Expression Expression assist level: Expresses basic needs/ideas: With no assist  Social Interaction Social Interaction assist level: Interacts appropriately with others - No medications needed.  Problem Solving Problem solving assist level: Solves complex problems: With extra time  Memory Memory assist level: More than reasonable amount of time    Medical Problem List and Plan: 1.  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  DVT Prophylaxis/Anticoagulation: Pharmaceutical: Other (comment)--Eliquis 3. Pain Management: tylenol prn 4. Mood: LCSW to follow for evaluation and support.  5. Neuropsych: This patient is capable of making decisions on her own behalf. 6. Skin/Wound Care: routine pressure relief measures. Prevalon boot for heel ulcer.  7. Fluids/Electrolytes/Nutrition: Strict I/Os.   8.  Hypotension: Monitor orthostatic BP. Added abdominal binder and TEDs to help with BP support.    Vitals:   05/15/17 1359 05/16/17 0448  BP: 99/69 103/64  Pulse: (!) 57 (!) 58  Resp: 18 17  Temp: 97.6 F (36.4 C) 97.6 F (36.4 C)   9. Paroxysmal Afib:  on eliquis for stroke prevention. Continue amiodarone bid and Cardizem daily.  10. Leukocytosis:              Patient with ulcers--continue with WOC recs/daily dressing             Afebrile             WBCs 17.9 on 8/7             Cont to monitor 11. Anemia of chronic disease: On epogen  MWF.              Hb 10.8 on 8/7             Cont to montior 12. Left foot/leg ulcer: Daily dressing changes with  santyl. Monitor for signs of infection.   Debrided 8/6 13. Anxiety/depression: On Seroquel and Zoloft at nights.  14. Prediabetes: CBG (last 3)   Recent Labs  05/15/17 1635 05/15/17 2205 05/16/17 0644  GLUCAP 102* 125* 80   Labile at present 15. CKD now HD dependent: HD on MWF at end of day to help with tolerance of therapy. Renal diet--Reducate patient on appropriate diet.  16. Vaginal bleeding  ?Resolved, per PCP no issues prior  Will consider further workup as necessary 17. Abnormal UA  UA +, Ucx pending  Will start empiric keflex    LOS (Days) 9 A FACE TO FACE EVALUATION WAS PERFORMED  Farley Crooker Lorie Phenix 05/16/2017 9:18 AM

## 2017-05-16 NOTE — Progress Notes (Signed)
Patient with recurrent bleeding this afternoon--dark blood with few clots in the diaper. Question of uterine prolapse on brief external exam. Discussed patient with Dr.Constant at [facility]. She recommended transvaginal ultrasound for work up as well as Megace 40 mg bid and follow up with them on outpatient basis.

## 2017-05-16 NOTE — Progress Notes (Signed)
Occupational Therapy Session Note  Patient Details  Name: [name] MRN: [id number] Date of Birth: Nov 12, 1950  Today's Date: 05/16/2017 OT Individual Time: 1000-1057 OT Individual Time Calculation (min): 57 min    Skilled Therapeutic Interventions/Progress Updates:    1:1. Pt with no c/o pain. Pt able to advance LE off EOB and transition supine>sitting EOB with min A for trunk facilitation. Pt slide board transfer throughout session EOB>w/c<>EOM with MAX A for board placement and scooting wit VC for head hip relationship. Pt dons pull over shirt with supervision and grooms at sink with A only to open mouthwash bottle and HOH A for pt to brush back of hair 2/2 B shoulder weakness. In tx gym, while seated on mat, pt plays game of connect for with MOD question cues for pt to strategise game and AAROM to reach above shoulder height to play chip. Pt practices lateral scooting EOM with MOD A and Vc for anterior weight shift and tactile cues for LE management. Exited session with pt seated in w/c with call light in reach and all needs met.   Therapy Documentation Precautions:  Precautions Precautions: Fall Restrictions Weight Bearing Restrictions: No  See Function Navigator for Current Functional Status.   Therapy/Group: Individual Therapy  Tonny Branch 05/16/2017, 12:11 PM

## 2017-05-16 NOTE — Progress Notes (Signed)
Physical Therapy Session Note  Patient Details  Name: [name] MRN: [id number] Date of Birth: Jul 09, 1951  Today's Date: 05/16/2017  Short Term Goals: Week 2:  PT Short Term Goal 1 (Week 2): pt will perform sit to stand with max A PT Short Term Goal 2 (Week 2): pt will consistently perform transfers with +1 max A  Skilled Therapeutic Interventions/Progress Updates: Pt with RN's, PA being assessed for ongoing medical issues; missed 45 min PT. Continue per POC as able.      Therapy Documentation Precautions:  Precautions Precautions: Fall Restrictions Weight Bearing Restrictions: No   See Function Navigator for Current Functional Status.   Therapy/Group: Individual Therapy  Luberta Mutter 05/16/2017, 7:57 AM

## 2017-05-16 NOTE — Progress Notes (Signed)
Occupational Therapy Session Note  Patient Details  Name: [name] MRN: [id number] Date of Birth: 1950/12/06  Today's Date: 05/16/2017 OT Individual Time: 1000-1057 OT Individual Time Calculation (min): 57 min    Short Term Goals: Week 2:  OT Short Term Goal 1 (Week 2): Pt will complete sit > stand in [place] with one person assist to decrease burden of care OT Short Term Goal 2 (Week 2): Pt will don pants with max assist at bed level utilizing AE PRN and circle sitting technique OT Short Term Goal 3 (Week 2): Pt will don shirt with supervision in unsupported sitting on EOB OT Short Term Goal 4 (Week 2): Pt will complete slide board transfer with mod assist of one caregiver  Skilled Therapeutic Interventions/Progress Updates:    Treatment session with focus on sit > stand, standing tolerance, and use of BUE.  Engaged in sit > stand in Standing Frame with pt able to increase participation in sit > stand.  Allowed slack in harness to promote increased standing with pt able to complete with UE support.  Pt with tendency to hyperextend through B knees, requiring cues and assist to correct as pt with increased difficulty with stand > sit due to hyperextension.  Pt tolerated standing 4, 7, and 6 mins at a time.  Engaged in table top task incorporating reaching and problem solving/awareness.  Pt continues to demonstrate difficulty with spatial awareness and problem solving.  Increased ability to lift RUE against gravity to obtain items to complete task.  Left upright in w/c with all needs in reach.   Therapy Documentation Precautions:  Precautions Precautions: Fall Restrictions Weight Bearing Restrictions: No Pain:  Pt with no c/o pain  See Function Navigator for Current Functional Status.   Therapy/Group: Individual Therapy  [name] 05/16/2017, 12:10 PM

## 2017-05-16 NOTE — Progress Notes (Signed)
[facility] Rounding Note Date of Service 05/15/17  SUBJECTIVE  Brief HPI: 66 y/o F with CKD IIIa now ESRD on iHD MWF and HFrEF (LVEF 25-30%) admitted 7/1 to [facility] with septic and cardiogenic shock with MODS (2/2 LLE cellulitis). Initially on CRRT however now remains on iHD. She requires Midodrine for persistent hypotension.  Currently in physical rehab following her prolonged hospitalization.   Interval events:  Continues to feel well. No recorded UOP however pt continues to endorse this. UA from this morning with rare bacteria, small Hgb, large leukocytes, 100 protein and WBC clumps. 6-30 WBC. Urine culture pending. **  Physical Exam   VITALS: Blood pressure 103/64, pulse (!) 58, temperature 97.6 F (36.4 C), temperature source Oral, resp. rate 17, height 5\' 5"  (1.651 m), weight 150 lb 12.7 oz (68.4 kg), SpO2 100 %. GEN: Alert and oriented, pleasant and conversant. Right IJ permacath (placed 04/26/17) CV: IRIR, rate about 70. Diastolic murmur PULM: CTAB, able to speak in complete sentences. Normal WOB on RA.  ABD: Soft, nontender. Not distended. SKIN: No rash. LLE with clean dry bandages.  EXT: Min LE edema.  Current Labs:  BMP Latest Ref Rng & Units 05/14/2017 05/12/2017 05/11/2017  Glucose 65 - 99 mg/dL 90 76 82  BUN 6 - 20 mg/dL 25(H) 50(H) 20  Creatinine 0.44 - 1.00 mg/dL 2.48(H) 3.32(H) 1.80(H)  Sodium 135 - 145 mmol/L 139 135 136  Potassium 3.5 - 5.1 mmol/L 3.9 3.7 4.1  Chloride 101 - 111 mmol/L 100(L) 97(L) 99(L)  CO2 22 - 32 mmol/L 29 27 28   Calcium 8.9 - 10.3 mg/dL 8.9 8.7(L) 8.2(L)  Phos 2.2 8/6, was 7.8 04/30/17  CBC CBC Latest Ref Rng & Units 05/14/2017 05/12/2017 05/11/2017  WBC 4.0 - 10.5 K/uL 13.7(H) 12.4(H) 10.9(H)  Hemoglobin 12.0 - 15.0 g/dL 9.4(L) 9.0(L) 9.2(L)  Hematocrit 36.0 - 46.0 % 30.0(L) 28.2(L) 29.1(L)  Platelets 150 - 400 K/uL 218 250 280   Current IP Medications . amiodarone  200 mg Oral BID  . apixaban  2.5 mg Oral BID  . collagenase   Topical Daily  . darbepoetin  (ARANESP) injection - DIALYSIS  60 mcg Intravenous Q Wed-HD  . diltiazem  120 mg Oral Daily  . famotidine  20 mg Oral QHS  . feeding supplement (NEPRO CARB STEADY)  237 mL Oral Q1500  . feeding supplement (PRO-STAT SUGAR FREE 64)  30 mL Oral BID  . levothyroxine  100 mcg Oral QAC breakfast  . midodrine  5 mg Oral TID WC  . multivitamin  1 tablet Oral QHS  . QUEtiapine  25 mg Oral QHS  . senna-docusate  2 tablet Oral QHS  . sertraline  25 mg Oral QHS  . sevelamer carbonate  800 mg Oral TID WC   Continuous Infusion Medications: None  PRN Medications:  acetaminophen, aluminum hydroxide, artificial tears, bisacodyl, diphenhydrAMINE, guaiFENesin-dextromethorphan, ipratropium-albuterol, ondansetron (ZOFRAN) IV, phenol, polyethylene glycol, prochlorperazine **OR** prochlorperazine **OR** prochlorperazine, zinc oxide  Assessment & Plan:  Ms. Burd is a 66yo female recently admitted to [facility] with septic and cardiogenic shock with multiorgan failure including anuric AKI for which she was started on CRRT and transitioned to intermittent HD (MWF). She remains HD dependent however now producing small volumes of urine. She is now in physical rehabilitation following her month long hospitalization.   1. 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 Having increased UOP. Currently scheduled for TTS at [facility] however pt prefers MWF. Currently that unit has not MWF  spots but her name cn be put on a wait list once she is in outpt setting. Will need access placed towards end of her stay, don't want to place now and interfere with therapy.  1. HD today 2. Vein mapping pending 2. Hypotension - On Midodrine for continued pressure support. BP stable.  1. Continue. 3. Anemia - On aranesp q wed, will receive 1st dose today. Iron stores are low and were repleted IV. 4. Hyperphosphatemia - Renvela decr to 800mg  TID WC yesterday as phos 2.2 day prior. PTH 130 so no vit D needed currently 1. Follow pre  and post HD labs today 5. A fib - on diltiazem, amiodarone; eliquis 6. Hypothyroidism - on levothyroxine  Einar Gip, DO Internal Medicine - PGY2 05/16/2017   Agree with excellent note of Dr. Danford Bad. Continue MWF HD inpt. For HD today after PT. Will be TTS outpt in [place] (no MWF spots available there at this time). For vein mapping today in anticipation of need for access placement toward end of rehab stay.  Jamal Maes, MD [facility] [phone] Pager 05/16/2017, 1:03 PM

## 2017-05-17 ENCOUNTER — Inpatient Hospital Stay (HOSPITAL_COMMUNITY): Payer: Medicare Other

## 2017-05-17 DIAGNOSIS — I4891 Unspecified atrial fibrillation: Secondary | ICD-10-CM | POA: Insufficient documentation

## 2017-05-17 DIAGNOSIS — N186 End stage renal disease: Secondary | ICD-10-CM

## 2017-05-17 DIAGNOSIS — R58 Hemorrhage, not elsewhere classified: Secondary | ICD-10-CM | POA: Insufficient documentation

## 2017-05-17 LAB — RENAL FUNCTION PANEL
ALBUMIN: 3.7 g/dL (ref 3.5–5.0)
ANION GAP: 10 (ref 5–15)
Albumin: 3.2 g/dL — ABNORMAL LOW (ref 3.5–5.0)
Anion gap: 10 (ref 5–15)
BUN: <5 mg/dL — ABNORMAL LOW (ref 6–20)
BUN: 8 mg/dL (ref 6–20)
CALCIUM: 8.3 mg/dL — AB (ref 8.9–10.3)
CHLORIDE: 98 mmol/L — AB (ref 101–111)
CO2: 27 mmol/L (ref 22–32)
CO2: 28 mmol/L (ref 22–32)
CREATININE: 1.39 mg/dL — AB (ref 0.44–1.00)
Calcium: 8.9 mg/dL (ref 8.9–10.3)
Chloride: 101 mmol/L (ref 101–111)
Creatinine, Ser: 1.03 mg/dL — ABNORMAL HIGH (ref 0.44–1.00)
GFR calc Af Amer: 45 mL/min — ABNORMAL LOW (ref >60)
GFR calc non Af Amer: 56 mL/min — ABNORMAL LOW (ref >60)
GFR, EST NON AFRICAN AMERICAN: 39 mL/min — AB (ref >60)
GLUCOSE: 94 mg/dL (ref 65–99)
GLUCOSE: 112 mg/dL — AB (ref 65–99)
POTASSIUM: 3.6 mmol/L (ref 3.5–5.1)
POTASSIUM: 3.9 mmol/L (ref 3.5–5.1)
Phosphorus: <1 mg/dL — CL (ref 2.5–4.6)
Phosphorus: <1 mg/dL — CL (ref 2.5–4.6)
SODIUM: 138 mmol/L (ref 135–145)
Sodium: 136 mmol/L (ref 135–145)

## 2017-05-17 LAB — GLUCOSE, CAPILLARY
GLUCOSE-CAPILLARY: 82 mg/dL (ref 65–99)
GLUCOSE-CAPILLARY: 101 mg/dL — AB (ref 65–99)
Glucose-Capillary: 116 mg/dL — ABNORMAL HIGH (ref 65–99)
Glucose-Capillary: 129 mg/dL — ABNORMAL HIGH (ref 65–99)
Glucose-Capillary: 77 mg/dL (ref 65–99)

## 2017-05-17 LAB — URINE CULTURE

## 2017-05-17 MED ORDER — MIDODRINE HCL 5 MG PO TABS
10.0000 mg | ORAL_TABLET | Freq: Three times a day (TID) | ORAL | Status: DC
  Administered 2017-05-17 – 2017-06-02 (×47): 10 mg via ORAL
  Filled 2017-05-17 (×45): qty 2

## 2017-05-17 MED ORDER — SODIUM PHOSPHATES 45 MMOLE/15ML IV SOLN
10.0000 mmol | Freq: Once | INTRAVENOUS | Status: DC
  Filled 2017-05-17: qty 3.33

## 2017-05-17 NOTE — Progress Notes (Signed)
Pt. Arrives back to rehab floor. Awake, alert, responding to questions appropriately. B/P=113/73 HR 120's. Patient denies s/sx associated with decreased blood pressure and increased heart rate. Patient hx includes Afib. Pt. Denies palpitations or feeling aware that heart rate is elevated, feeling clammy or sweating,SOB, CP. Patient reassures RN she does not feel any differently than what she did prior to going to dialysis.

## 2017-05-17 NOTE — Progress Notes (Signed)
Occupational Therapy Note  Patient Details  Name: [name] MRN: [id number] Date of Birth: 08-26-1951  Today's Date: 05/17/2017 OT Missed Time: 45 Minutes Missed Time Reason: Unavailable (comment) (down for ultrasound during scheduled therapy time)  OT attempted to see pt for scheduled OT intervention but pt currently off floor for ultrasound. Will check back at next available time.    Darleen Crocker P 05/17/2017, 11:44 AM

## 2017-05-17 NOTE — Progress Notes (Signed)
Occupational Therapy Session Note  Patient Details  Name: [name] MRN: [id number] Date of Birth: 12/06/1950  Today's Date: 05/17/2017  Late entry for 05/16/2017 OT Individual Time:  - 11:30-12:00       Short Term Goals: Week 2:  OT Short Term Goal 1 (Week 2): Pt will complete sit > stand in [place] with one person assist to decrease burden of care OT Short Term Goal 2 (Week 2): Pt will don pants with max assist at bed level utilizing AE PRN and circle sitting technique OT Short Term Goal 3 (Week 2): Pt will don shirt with supervision in unsupported sitting on EOB OT Short Term Goal 4 (Week 2): Pt will complete slide board transfer with mod assist of one caregiver  Skilled Therapeutic Interventions/Progress Updates:    Focus on slide board transfers w/c<>mat. Pt required A to weight shift laterally for the board to be placed but is then able to transfer with steadying A with extra time.  On EOM continued focus on lateral and forward weight shifts, ability to push through UEs and LEs to improve bottom clearance with transfers and clothing management.  Also perform two sit to stands from EOB with max A but pt maintained a flexed trunk posture in standing.   Therapy Documentation Precautions:  Precautions Precautions: Fall Restrictions Weight Bearing Restrictions: No General:   Vital Signs: Therapy Vitals Temp: 97.8 F (36.6 C) Temp Source: Oral Resp: 18 BP: (!) 107/59 Patient Position (if appropriate): Lying Oxygen Therapy SpO2: 100 % O2 Device: Not Delivered Pain:  no c/o pain   See Function Navigator for Current Functional Status.   Therapy/Group: Individual Therapy  Willeen Cass [facility] 05/17/2017, 7:31 AM

## 2017-05-17 NOTE — Progress Notes (Signed)
[facility] PHYSICAL MEDICINE & REHABILITATION     PROGRESS NOTE  Subjective/Complaints:  Pt seen laying in bed this AM.  She slept well overnight, except for returning from HD at 3 AM.  Yesterday, she was noted to have vaginal bleeding again.   ROS: Denies nausea, vomiting, diarrhea, shortness of breath or chest pain   Objective: Vital Signs: Blood pressure (!) 107/59, pulse (!) 113, temperature 97.8 F (36.6 C), temperature source Oral, resp. rate 18, height 5\' 5"  (1.651 m), weight 68.5 kg (151 lb), SpO2 100 %. No results found.  Recent Labs  05/15/17 1411 05/16/17 1946  WBC 17.9* 13.7*  HGB 10.8* 8.8*  HCT 35.3* 28.5*  PLT 241 195    Recent Labs  05/17/17 0456 05/17/17 1006  NA 138 136  K 3.6 3.9  CL 101 98*  GLUCOSE 94 112*  BUN <5* 8  CREATININE 1.03* 1.39*  CALCIUM 8.3* 8.9   CBG (last 3)   Recent Labs  05/17/17 0032 05/17/17 0135 05/17/17 0628  GLUCAP 77 82 101*    Wt Readings from Last 3 Encounters:  05/17/17 68.5 kg (151 lb)  05/07/17 75.3 kg (166 lb 0.1 oz)    Physical Exam:  BP (!) 107/59 (BP Location: Right Arm)   Pulse (!) 113   Temp 97.8 F (36.6 C) (Oral)   Resp 18   Ht 5\' 5"  (1.651 m)   Wt 68.5 kg (151 lb)   SpO2 100%   BMI 25.13 kg/m  Constitutional: She appears well-developed and well-nourished.  HENT: Normocephalic. Atraumatic. Eyes: EOMI. No discharge.  Cardiovascular: IRIR Respiratory: CTA Bilaterally. Normal effort.  GI: Soft. Bowel sounds are normal.   Musculoskeletal: She exhibits edema and tenderness.  Neurological: She is alert and oriented.  Able to follow commands without difficulty.  Motor: B/l UE 4/5 proximal to distal B/l LE: HF 2/5, KE 2+/5, ADF/PF 3/5 (unchanged) Skin: LLE with ulcerations with eschar on dorsum of foot and distal tibia with dressings in place. Psychiatric: She has a normal mood and affect. Her behavior is normal. Thought content normal.    Assessment/Plan: 1. 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 Physiatrist is providing close team supervision and 24 hour management of active medical problems listed below. Physiatrist and rehab team continue to assess barriers to discharge/monitor patient progress toward functional and medical goals.  Function:  Bathing Bathing position   Position: Bed  Bathing parts Body parts bathed by patient: Right arm, Left arm, Chest, Abdomen, Front perineal area, Right upper leg, Left upper leg Body parts bathed by helper: Front perineal area, Buttocks, Back  Bathing assist Assist Level: Touching or steadying assistance(Pt > 75%)      Upper Body Dressing/Undressing Upper body dressing   What is the patient wearing?: Pull over shirt/dress     Pull over shirt/dress - Perfomed by patient: Put head through opening, Thread/unthread left sleeve, Pull shirt over trunk Pull over shirt/dress - Perfomed by helper: Put head through opening, Pull shirt over trunk        Upper body assist Assist Level: Touching or steadying assistance(Pt > 75%)      Lower Body Dressing/Undressing Lower body dressing   What is the patient wearing?: Pants     Pants- Performed by patient: Pull pants up/down Pants- Performed by helper: Thread/unthread right pants leg, Thread/unthread left pants leg, Pull pants up/down   Non-skid slipper socks- Performed by helper: Don/doff  right sock                  Lower body assist Assist for lower body dressing: Touching or steadying assistance (Pt > 75%) (Total assist)      Toileting Toileting Toileting activity did not occur: N/A (anuric, no bm, no toileting performed)   Toileting steps completed by helper: Adjust clothing prior to toileting, Performs perineal hygiene, Adjust clothing after toileting    Toileting assist Assist level: Two helpers   Transfers Chair/bed transfer   Chair/bed transfer method: Lateral  scoot Chair/bed transfer assist level: 2 helpers Chair/bed transfer assistive device: Sliding board, Armrests Mechanical lift: Stedy   Locomotion Ambulation Ambulation activity did not occur: Safety/medical concerns         Wheelchair   Type: Manual Max wheelchair distance: 10' Assist Level: Touching or steadying assistance (Pt > 75%)  Cognition Comprehension Comprehension assist level: Follows basic conversation/direction with no assist  Expression Expression assist level: Expresses basic needs/ideas: With no assist  Social Interaction Social Interaction assist level: Interacts appropriately with others - No medications needed.  Problem Solving Problem solving assist level: Solves complex problems: With extra time  Memory Memory assist level: More than reasonable amount of time    Medical Problem List and Plan: 1.  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  DVT Prophylaxis/Anticoagulation: Pharmaceutical: Other (comment)--Eliquis, may need to d/c if bleeding persists 3. Pain Management: tylenol prn 4. Mood: LCSW to follow for evaluation and support.  5. Neuropsych: This patient is capable of making decisions on her own behalf. 6. Skin/Wound Care: routine pressure relief measures. Prevalon boot for heel ulcer.  7. Fluids/Electrolytes/Nutrition: Strict I/Os.   8.  Hypotension: Monitor orthostatic BP. Added abdominal binder and TEDs to help with BP support.    Vitals:   05/17/17 0255 05/17/17 0508  BP: 103/72 (!) 107/59  Pulse:    Resp: 18 18  Temp:  97.8 F (36.6 C)  SpO2: 100% 100%   Controlled 8/9 9. Paroxysmal Afib:  on eliquis for stroke prevention. Continue amiodarone bid and Cardizem daily.  10. Leukocytosis:              Patient with ulcers--continue with WOC recs/daily dressing             Afebrile             WBCs 13.7 on 8/8  Labs ordered for tomorrow             Cont to monitor 11. Anemia of chronic disease: On  epogen  MWF.              Hb 8.8 on 8/8  Labs ordered for tomorrow             Cont to montior 12. Left foot/leg ulcer: Daily dressing changes with santyl. Monitor for signs of infection.   Debrided 8/6, plan to debride again 8/13 13. Anxiety/depression: On Seroquel and Zoloft at nights.  14. Prediabetes: CBG (last 3)   Recent Labs  05/17/17 0032 05/17/17 0135 05/17/17 0628  GLUCAP 77 82 101*   Relatively controlled 8/9 15. CKD now HD dependent: HD on MWF at end of day to help with tolerance of therapy. Renal diet--Reducate patient on appropriate diet.  16. Vaginal bleeding  Per PCP no issues prior  Gyn consulted, Megace initiated, transvaginal U/S ordered 17. Abnormal UA  UA +, Ucx with multiple species  Cont Keflex x7  days, starting 8/8    LOS (Days) 10 A FACE TO FACE EVALUATION WAS PERFORMED  Roddie Riegler Lorie Phenix 05/17/2017 11:17 AM

## 2017-05-17 NOTE — Progress Notes (Signed)
CRITICAL VALUE ALERT  Critical Value:  Phos < 1  Date & Time Notied:  05/17/17 6333  Provider Notified: Linna Hoff, PA  Orders Received/Actions taken: No new orders received

## 2017-05-17 NOTE — Progress Notes (Signed)
Upper Extremity Vein Map   Right Cephalic  Segment Diameter Depth Comment  1. Axilla 1.19mm mm   2. Mid upper arm 1.84mm mm   3. Above AC 2.65mm mm   4. In [facility] 3.75mm mm   5. Below AC 1.72mm mm   6. Mid forearm 1.36mm mm   7. Wrist 1.67mm mm    mm mm    mm mm    mm mm    Right Basilic  Segment Diameter Depth Comment  1. Axilla mm mm   2. Mid upper arm 5.60mm mm   3. Above [facility] 3.76mm 13.77mm   4. In [facility] 3.59mm 14.62mm   5. Below AC 2.75mm 10.17mm  branch  6. Mid forearm mm 7.17mm   7. Wrist mm mm    mm mm    mm mm    mm mm     Left Cephalic  Segment Diameter Depth Comment  1. Axilla 1.2mm mm   2. Mid upper arm 1.46mm mm   3. Above AC 1.36mm mm   4. In AC 1.25mm mm   5. Below AC 1.56mm mm   6. Mid forearm 1.96mm mm   7. Wrist mm mm    mm mm    mm mm    mm mm    Left Basilic  Segment Diameter Depth Comment  1. Axilla mm mm   2. Mid upper arm 3.17mm 17.67mm branch  3. Above AC mm mm   4. In [facility] 2.54mm 15.76mm   5. Below AC 1.28mm 23.70mm   6. Mid forearm mm mm   7. Wrist mm mm    mm mm    mm mm    mm mm     Landry Mellow, RDMS, RVT 05/17/2017

## 2017-05-17 NOTE — Progress Notes (Signed)
HD tx completed @ 1916 w/ bp issues throughout tx although pt remained asymptomatic throughout, UF goal still met at the lower end ordered, blood rinsed back, report called to Theador Hawthorne, RN

## 2017-05-17 NOTE — Progress Notes (Signed)
Physical Therapy Note  Patient Details  Name: [name] MRN: [id number] Date of Birth: [birth year] Today's Date: 05/17/2017    Time: 1330-1400 30 minutes  1:1 No c/o pain. Pt states she does not want to get out of bed due to continued vaginal bleeding but she is agreeable to bed level therex.  Pt performed AAROM for bilat UEs and LEs with much improvement in strength noted especially in Lt UE.  Pt missed final 45 minutes of therapy due to IV team arriving.   Ashton Sabine 05/17/2017, 2:07 PM

## 2017-05-17 NOTE — Progress Notes (Signed)
[facility] Rounding Note  SUBJECTIVE  Brief HPI: 66 y/o F with CKD IIIa now ESRD on iHD MWF and HFrEF (LVEF 25-30%) admitted 7/1 to [facility] with septic and cardiogenic shock with MODS (2/2 LLE cellulitis). Initially on CRRT however now remains on iHD. She requires Midodrine for persistent hypotension.  Currently in physical rehab following her prolonged hospitalization.   Interval events:  Phos <1 on this mornings labs. Repeated phos confirmed <1, IV Na phos ordered. Renvela stopped. (Renvela decr 8/7 to 831m TID WC).   HD completed at midnight last night. Had hypotension throughout treatment although asymptomatic, lowest 85/49. Met UF goal.  Continues to have vaginal bleeding with dark blood and few clots on brief. Primary DW OB/GYN who rec transvaginal [place] Megace 436mBID and outpatient follow-up.   OBJECTIVE Physical Exam   VITALS: Blood pressure (!) 107/59, pulse (!) 113, temperature 97.8 F (36.6 C), temperature source Oral, resp. rate 18, height _0  (1.651 m), weight 151 lb (68.5 kg), SpO2 100 %. GEN: Alert and oriented, pleasant and conversant. Right IJ permacath (placed 04/26/17) CV: IRIR, rate about 100. Diastolic murmur PULM: CTAB, able to speak in complete sentences. Normal WOB on RA.  ABD: Soft, nontender. Not distended. SKIN: No rash. LLE with clean dry bandages.  EXT: Min LE edema.  Current Labs:  BMP Latest Ref Rng & Units 05/17/2017 05/16/2017 05/16/2017  Glucose 65 - 99 mg/dL 94 80 94  BUN 6 - 20 mg/dL <5(L) 29(H) 28(H)  Creatinine 0.44 - 1.00 mg/dL 1.03(H) 2.67(H) 2.45(H)  Sodium 135 - 145 mmol/L 138 135 137  Potassium 3.5 - 5.1 mmol/L 3.6 3.7 4.3  Chloride 101 - 111 mmol/L 101 97(L) 99(L)  CO2 22 - 32 mmol/L _1 Calcium 8.9 - 10.3 mg/dL 8.3(L) 8.8(L) 9.3  Phos <1, was 2.2 8/6, 7.8 04/30/17  CBC Latest Ref Rng & Units 05/16/2017 05/15/2017 05/14/2017  WBC 4.0 - 10.5 K/uL 13.7(H) 17.9(H) 13.7(H)  Hemoglobin 12.0 - 15.0 g/dL 8.8(L) 10.8(L) 9.4(L)  Hematocrit 36.0 - 46.0 %  28.5(L) 35.3(L) 30.0(L)  Platelets 150 - 400 K/uL 195 241 218   Current IP Medications . amiodarone  200 mg Oral BID  . apixaban  2.5 mg Oral BID  . cephALEXin  250 mg Oral Q12H  . collagenase   Topical Daily  . darbepoetin (ARANESP) injection - DIALYSIS  60 mcg Intravenous Q Wed-HD  . diltiazem  120 mg Oral Daily  . famotidine  20 mg Oral QHS  . feeding supplement (NEPRO CARB STEADY)  237 mL Oral Q1500  . feeding supplement (PRO-STAT SUGAR FREE 64)  30 mL Oral BID  . levothyroxine  100 mcg Oral QAC breakfast  . megestrol  40 mg Oral BID  . midodrine  5 mg Oral TID WC  . multivitamin  1 tablet Oral QHS  . QUEtiapine  25 mg Oral QHS  . senna-docusate  2 tablet Oral QHS  . sertraline  25 mg Oral QHS   Continuous Infusion Medications: None  PRN Medications:  acetaminophen, aluminum hydroxide, artificial tears, bisacodyl, diphenhydrAMINE, guaiFENesin-dextromethorphan, heparin, ipratropium-albuterol, ondansetron (ZOFRAN) IV, phenol, polyethylene glycol, prochlorperazine **OR** prochlorperazine **OR** prochlorperazine, zinc oxide  Assessment & Plan:  Ms. HePacciones a [age]yo[place] [facility] [place] [facility] [place] with multiorgan failure including anuric AKI for which she was started on CRRT and transitioned to intermittent HD (MWF). She remains HD dependent however now producing small volumes of urine. She is now in physical rehabilitation following  her month long hospitalization. (Note creatinine 09/2014 0.62, and was 1.94 on presentation to [facility] 04/08/17 with LE cellulitis, anasarca, AKI, Serologic work up complements normal, negative SPEP/UPEP, ANA negative, ANCA negative, GBM negative, negative hepatitis)  1. 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 May need higher pre-HD dose. Wanted MWF schedule however unit not accepting those spots. Will continue with plan for transition to TTS schedule prior to  d/c. Continues to have little UOP.  1. Follow up vein mapping 2. Consider additional pre-HD Midodrine Will increase dose to 10 mg TID 2. Hypotension - On Midodrine for continued pressure support. BP stable.  1. Continue 2. Consider increase as above 3. Anemia, Iron deficiency - Got 1st dose of aranesp yest. S/p iron repletion.  4. Hyperphosphatemia - Renvela decr to 8028mTID WC Tues due to phos of 2.2. Phos <1 today, will replace with ** 1. Discontinued Renvela 2. Replace phos 3. Liberalize diet 5. A fib - on diltiazem, amiodarone; eliquis 6. Hypothyroidism - on levothyroxine 7. Vaginal bleeding - noted for the past 2-3 days. Small volume. Has been postmenopausal for 10+ years. Primary d/w GYn who rec transvaginal [place] Megace which has been arranged. To have outpatient FU.   BeEinar GipDO Internal Medicine - PGY2 05/17/2017   Agree with assessment and recommendations in the excellent not of Dr. MoDanford BadPt had HD late yesterday, low BP's and did not tolerate UF. Will bump midodrine to 10 mg TID and keep volume even with next HD - reassess target weight. Phos <1, liberalize diet to regular for now, IV Na phosphate. Binders stopped.   CyJamal MaesMD [place] 05/17/2017, 1:39 PM

## 2017-05-17 NOTE — Progress Notes (Signed)
Physical Therapy Session Note  Patient Details  Name: [name] MRN: [id number] Date of Birth: 03-22-1951  Today's Date: 05/17/2017 PT Individual Time: 0809-0905 PT Individual Time Calculation (min): 56 min   Short Term Goals: Week 1:  PT Short Term Goal 1 (Week 1): pt will perform functional transfers wtih max A PT Short Term Goal 1 - Progress (Week 1): Progressing toward goal PT Short Term Goal 2 (Week 1): pt will perform sit to stand with max A PT Short Term Goal 2 - Progress (Week 1): Not met  Skilled Therapeutic Interventions/Progress Updates:  Pt received in bed & agreeable to tx. Pt performed rolling in bed with max assist and bed rails with cuing and assistance with placement of BLE to assist with rolling. Therapist provided total assist with changing brief & peri care 2/2 vaginal bleeding (RN already aware). Pt unable to clear buttocks when bridging to allow therapist to pull pants over hips and instead rolled L<>R for clothing management. Pt transfers supine>sitting EOB with max assist and multimodal cuing for weight shifting & placement. Pt requires total assist for w/c & slide board set up, max cuing for head/hips relationship and sequencing, and assistance with LE placement to complete slide board transfer to w/c on R with extra time. Pt completed grooming tasks (brushing teeth, using mouthwash, brushing hair) from w/c level with set up assist. In gym pt declined standing 2/2 bleeding & instead performed the following BLE strengthening exercises: weight long arc quads, hip abduction with orange theraband, hip adduction pillow squeezes and hip flexion with cuing for technique. At end of session pt left sitting in w/c in room with all needs within reach.   Therapy Documentation Precautions:  Precautions Precautions: Fall Restrictions Weight Bearing Restrictions: No  Pain: No c/o pain reported.  See Function Navigator for Current Functional Status.   Therapy/Group: Individual  Therapy  Waunita Schooner 05/17/2017, 9:20 AM

## 2017-05-18 ENCOUNTER — Inpatient Hospital Stay (HOSPITAL_COMMUNITY): Payer: Medicare Other | Admitting: Physical Therapy

## 2017-05-18 ENCOUNTER — Inpatient Hospital Stay (HOSPITAL_COMMUNITY): Payer: Medicare Other

## 2017-05-18 DIAGNOSIS — N179 Acute kidney failure, unspecified: Secondary | ICD-10-CM | POA: Insufficient documentation

## 2017-05-18 LAB — RENAL FUNCTION PANEL
ALBUMIN: 3.4 g/dL — AB (ref 3.5–5.0)
Anion gap: 11 (ref 5–15)
BUN: 36 mg/dL — AB (ref 6–20)
CO2: 26 mmol/L (ref 22–32)
CREATININE: 2.63 mg/dL — AB (ref 0.44–1.00)
Calcium: 8.9 mg/dL (ref 8.9–10.3)
Chloride: 101 mmol/L (ref 101–111)
GFR calc Af Amer: 21 mL/min — ABNORMAL LOW (ref >60)
GFR, EST NON AFRICAN AMERICAN: 18 mL/min — AB (ref >60)
Glucose, Bld: 122 mg/dL — ABNORMAL HIGH (ref 65–99)
PHOSPHORUS: 1.8 mg/dL — AB (ref 2.5–4.6)
Potassium: 3.4 mmol/L — ABNORMAL LOW (ref 3.5–5.1)
SODIUM: 138 mmol/L (ref 135–145)

## 2017-05-18 LAB — CBC WITH DIFFERENTIAL/PLATELET
BASOS ABS: 0 10*3/uL (ref 0.0–0.1)
Basophils Relative: 0 %
Eosinophils Absolute: 0.6 10*3/uL (ref 0.0–0.7)
Eosinophils Relative: 5 %
HCT: 29.1 % — ABNORMAL LOW (ref 36.0–46.0)
Hemoglobin: 9.2 g/dL — ABNORMAL LOW (ref 12.0–15.0)
LYMPHS ABS: 1.7 10*3/uL (ref 0.7–4.0)
Lymphocytes Relative: 14 %
MCH: 32.2 pg (ref 26.0–34.0)
MCHC: 31.6 g/dL (ref 30.0–36.0)
MCV: 101.7 fL — ABNORMAL HIGH (ref 78.0–100.0)
Monocytes Absolute: 1 10*3/uL (ref 0.1–1.0)
Monocytes Relative: 8 %
NEUTROS PCT: 73 %
Neutro Abs: 8.9 10*3/uL — ABNORMAL HIGH (ref 1.7–7.7)
PLATELETS: 210 10*3/uL (ref 150–400)
RBC: 2.86 MIL/uL — ABNORMAL LOW (ref 3.87–5.11)
RDW: 22.5 % — AB (ref 11.5–15.5)
WBC: 12.2 10*3/uL — AB (ref 4.0–10.5)

## 2017-05-18 LAB — GLUCOSE, CAPILLARY
GLUCOSE-CAPILLARY: 94 mg/dL (ref 65–99)
GLUCOSE-CAPILLARY: 112 mg/dL — AB (ref 65–99)
Glucose-Capillary: 71 mg/dL (ref 65–99)

## 2017-05-18 MED ORDER — MIDODRINE HCL 5 MG PO TABS
ORAL_TABLET | ORAL | Status: AC
  Administered 2017-05-18: 10 mg via ORAL
  Filled 2017-05-18: qty 2

## 2017-05-18 NOTE — Progress Notes (Signed)
Patient arrived to unit by bed.  Reviewed treatment plan and this RN agrees with plan.  Report received from bedside RN, Whitney.  Consent verified.  Patient A & O X 4.   Lung sounds diminished and clear to ausculation in all fields. No edema. Cardiac:  Afib.  Removed caps and cleansed RIJ catheter with chlorhedxidine.  Aspirated ports of heparin and flushed them with saline per protocol.  Connected and secured lines, initiated treatment at 1653.  UF Goal of 500 mL and net fluid removal 0 L.  Will continue to monitor.

## 2017-05-18 NOTE — Progress Notes (Signed)
Dialysis treatment completed.  500 mL ultrafiltrated.  0 mL net fluid removal.  Patient status unchanged. Lung sounds diminished to ausculation in all fields. No edema. Cardiac: Afib.  Cleansed RIJ catheter with chlorhexidine.  Disconnected lines and flushed ports with saline per protocol.  Ports locked with heparin and capped per protocol.    Report given to bedside, RN Roselyn Reef.

## 2017-05-18 NOTE — Progress Notes (Signed)
[facility] PHYSICAL MEDICINE & REHABILITATION     PROGRESS NOTE  Subjective/Complaints:  Pt seen laying in bed this AM.  She slept well overnight.  She notes minimal bleeding.    ROS: +Vaginal bleeding. Denies nausea, vomiting, diarrhea, shortness of breath or chest pain   Objective: Vital Signs: Blood pressure 110/75, pulse (!) 109, temperature 97.8 F (36.6 C), temperature source Oral, resp. rate 16, height 5\' 5"  (1.651 m), weight 68.6 kg (151 lb 3.2 oz), SpO2 99 %. US Transvaginal Non-ob  Result Date: 05/17/2017 CLINICAL DATA:  Vaginal bleeding. EXAM: TRANSABDOMINAL AND TRANSVAGINAL ULTRASOUND OF PELVIS TECHNIQUE: Both transabdominal and transvaginal ultrasound examinations of the pelvis were performed. Transabdominal technique was performed for global imaging of the pelvis including uterus, ovaries, adnexal regions, and pelvic cul-de-sac. It was necessary to proceed with endovaginal exam following the transabdominal exam to visualize the endometrium. COMPARISON:  None. FINDINGS: Uterus Measurements: 7.2 x 3.7 x 5.1 cm. No fibroids. Endometrium Thickness: 22 mm, abnormal. There is a 3.7 x 2.0 x 3.4 cm hyperechoic endometrial mass and a small amount of endometrial fluid. Right ovary Not well visualized. Possible 2.7 x 1.6 x 1.4 cm cystic lesion in the right adnexa. Left ovary Not visualized.  No adnexal mass. Other findings Trace free fluid. IMPRESSION: 1. Markedly abnormal and thickened endometrium containing a focal 3.7 cm hyperechoic mass. In the setting of post-menopausal bleeding, endometrial sampling is indicated to exclude carcinoma. If results are benign, sonohysterogram should be considered for focal lesion work-up prior to hysteroscopy. (Ref: Radiological Reasoning: Algorithmic Workup of Abnormal Vaginal Bleeding with Endovaginal Sonography and Sonohysterography. AJR 2008; 631:S97-02) 2. Possible 2.7 cm cystic lesion in the right adnexa, not well seen on transvaginal images. This is almost  certainly benign, but follow up ultrasound is recommended in 1 year according to the Society of Radiologists in [place] Statement (D Clovis Riley et al. Management of Asymptomatic Ovarian and Other Adnexal Cysts Imaged at [place]: Society of Radiologists in [place] Statement 2010. Radiology 256 (Sept 2010): 637-858.). Electronically Signed   By: Titus Dubin M.D.   On: 05/17/2017 11:39   US Pelvis Complete  Result Date: 05/17/2017 CLINICAL DATA:  Vaginal bleeding. EXAM: TRANSABDOMINAL AND TRANSVAGINAL ULTRASOUND OF PELVIS TECHNIQUE: Both transabdominal and transvaginal ultrasound examinations of the pelvis were performed. Transabdominal technique was performed for global imaging of the pelvis including uterus, ovaries, adnexal regions, and pelvic cul-de-sac. It was necessary to proceed with endovaginal exam following the transabdominal exam to visualize the endometrium. COMPARISON:  None. FINDINGS: Uterus Measurements: 7.2 x 3.7 x 5.1 cm. No fibroids. Endometrium Thickness: 22 mm, abnormal. There is a 3.7 x 2.0 x 3.4 cm hyperechoic endometrial mass and a small amount of endometrial fluid. Right ovary Not well visualized. Possible 2.7 x 1.6 x 1.4 cm cystic lesion in the right adnexa. Left ovary Not visualized.  No adnexal mass. Other findings Trace free fluid. IMPRESSION: 1. Markedly abnormal and thickened endometrium containing a focal 3.7 cm hyperechoic mass. In the setting of post-menopausal bleeding, endometrial sampling is indicated to exclude carcinoma. If results are benign, sonohysterogram should be considered for focal lesion work-up prior to hysteroscopy. (Ref: Radiological Reasoning: Algorithmic Workup of Abnormal Vaginal Bleeding with Endovaginal Sonography and Sonohysterography. AJR 2008; 850:Y77-41) 2. Possible 2.7 cm cystic lesion in the right adnexa, not well seen on transvaginal images. This is almost certainly benign, but follow up ultrasound is  recommended in 1 year according to the Society of Radiologists in [place] (  D Levine et al. Management of Asymptomatic Ovarian and Other Adnexal Cysts Imaged at [place]: Society of Radiologists in [place] Statement 2010. Radiology 256 (Sept 2010): 814-481.). Electronically Signed   By: Titus Dubin M.D.   On: 05/17/2017 11:39    Recent Labs  05/16/17 1946 05/18/17 0451  WBC 13.7* 12.2*  HGB 8.8* 9.2*  HCT 28.5* 29.1*  PLT 195 210    Recent Labs  05/17/17 0456 05/17/17 1006  NA 138 136  K 3.6 3.9  CL 101 98*  GLUCOSE 94 112*  BUN <5* 8  CREATININE 1.03* 1.39*  CALCIUM 8.3* 8.9   CBG (last 3)   Recent Labs  05/17/17 1644 05/17/17 2140 05/18/17 0638  GLUCAP 129* 116* 94    Wt Readings from Last 3 Encounters:  05/18/17 68.6 kg (151 lb 3.2 oz)  05/07/17 75.3 kg (166 lb 0.1 oz)    Physical Exam:  BP 110/75 (BP Location: Right Arm)   Pulse (!) 109   Temp 97.8 F (36.6 C) (Oral)   Resp 16   Ht 5\' 5"  (1.651 m)   Wt 68.6 kg (151 lb 3.2 oz)   SpO2 99%   BMI 25.16 kg/m  Constitutional: She appears well-developed and well-nourished.  HENT: Normocephalic. Atraumatic. Eyes: EOMI. No discharge.  Cardiovascular: IRIR Respiratory: CTA Bilaterally. Normal effort.  GI: Soft. Bowel sounds are normal.   Musculoskeletal: She exhibits edema and tenderness.  Neurological: She is alert and oriented.  Able to follow commands without difficulty.  Motor: B/l UE 4/5 proximal to distal B/l LE: HF 2/5, KE 2+/5, ADF/PF 3/5 (stable) Skin: LLE with ulcerations with eschar on dorsum of foot and distal tibia with dressings in place. Psychiatric: She has a normal mood and affect. Her behavior is normal. Thought content normal.    Assessment/Plan: 1. 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 Physiatrist is providing close team  supervision and 24 hour management of active medical problems listed below. Physiatrist and rehab team continue to assess barriers to discharge/monitor patient progress toward functional and medical goals.  Function:  Bathing Bathing position   Position: Bed  Bathing parts Body parts bathed by patient: Right arm, Left arm, Chest, Abdomen, Front perineal area, Right upper leg, Left upper leg Body parts bathed by helper: Front perineal area, Buttocks, Back  Bathing assist Assist Level: Touching or steadying assistance(Pt > 75%)      Upper Body Dressing/Undressing Upper body dressing   What is the patient wearing?: Pull over shirt/dress     Pull over shirt/dress - Perfomed by patient: Put head through opening, Thread/unthread left sleeve, Pull shirt over trunk Pull over shirt/dress - Perfomed by helper: Put head through opening, Pull shirt over trunk        Upper body assist Assist Level: Touching or steadying assistance(Pt > 75%)      Lower Body Dressing/Undressing Lower body dressing   What is the patient wearing?: Pants     Pants- Performed by patient: Pull pants up/down Pants- Performed by helper: Thread/unthread right pants leg, Thread/unthread left pants leg, Pull pants up/down   Non-skid slipper socks- Performed by helper: Don/doff right sock                  Lower body assist Assist for lower body dressing: Touching or steadying assistance (Pt > 75%) (Total assist)      Toileting Toileting Toileting activity did not occur: N/A (  anuric, no bm, no toileting performed)   Toileting steps completed by helper: Adjust clothing prior to toileting, Performs perineal hygiene, Adjust clothing after toileting    Toileting assist Assist level: Two helpers   Transfers Chair/bed transfer   Chair/bed transfer method: Lateral scoot Chair/bed transfer assist level: 2 helpers Chair/bed transfer assistive device: Sliding board, Armrests Mechanical lift: Stedy    Locomotion Ambulation Ambulation activity did not occur: Safety/medical concerns         Wheelchair   Type: Manual Max wheelchair distance: 10' Assist Level: Touching or steadying assistance (Pt > 75%)  Cognition Comprehension Comprehension assist level: Follows basic conversation/direction with no assist  Expression Expression assist level: Expresses basic needs/ideas: With no assist  Social Interaction Social Interaction assist level: Interacts appropriately with others - No medications needed.  Problem Solving Problem solving assist level: Solves complex problems: With extra time  Memory Memory assist level: More than reasonable amount of time    Medical Problem List and Plan: 1.  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  DVT Prophylaxis/Anticoagulation: Pharmaceutical: Other (comment)--Eliquis, may need to d/c if bleeding persists 3. Pain Management: tylenol prn 4. Mood: LCSW to follow for evaluation and support.  5. Neuropsych: This patient is capable of making decisions on her own behalf. 6. Skin/Wound Care: routine pressure relief measures. Prevalon boot for heel ulcer.  7. Fluids/Electrolytes/Nutrition: Strict I/Os.   8.  Hypotension: Monitor orthostatic BP. Added abdominal binder and TEDs to help with BP support.    Vitals:   05/17/17 1400 05/18/17 0435  BP: 103/62 110/75  Pulse: (!) 109 (!) 109  Resp: 18 16  Temp: 97.6 F (36.4 C) 97.8 F (36.6 C)  SpO2: 100% 99%   Controlled 8/10 9. Paroxysmal Afib:  on eliquis for stroke prevention. Continue amiodarone bid and Cardizem daily.  10. Leukocytosis:              Patient with ulcers--continue with WOC recs/daily dressing             Afebrile             WBCs 12.2 on 8/10             Cont to monitor 11. Anemia of chronic disease: On epogen  MWF.              Hb 9.2 on 8/10             Cont to montior 12. Left foot/leg ulcer: Daily dressing changes with santyl. Monitor  for signs of infection.   Debrided 8/6, plan to debride again 8/13 13. Anxiety/depression: On Seroquel and Zoloft at nights.  14. Prediabetes: CBG (last 3)   Recent Labs  05/17/17 1644 05/17/17 2140 05/18/17 0638  GLUCAP 129* 116* 94   Relatively controlled 8/10 15. CKD now HD dependent: HD on MWF at end of day to help with tolerance of therapy. Renal diet--Reducate patient on appropriate diet.  16. Vaginal bleeding  Per PCP no issues prior  Gyn consulted, Megace initiated, transvaginal U/S showing endometrial thickening and mass.  Contacted Gyn, per recs, outpt workup to rule out CA. 17. Abnormal UA  UA +, Ucx with multiple species  Cont Keflex x7 days, starting 8/8 18. AKI  Cr 1.39 on 8/9  Encourage fluids  Cont to monitor  LOS (Days) 11 A FACE TO FACE EVALUATION WAS PERFORMED  [name] 05/18/2017 10:03 AM

## 2017-05-18 NOTE — Progress Notes (Addendum)
[facility] Rounding Note  SUBJECTIVE  Brief HPI: 66 y/o F with CKD IIIa now ESRD on iHD MWF and HFrEF (LVEF 25-30%) admitted 7/1 to [facility] with septic and cardiogenic shock with MODS (2/2 LLE cellulitis). Initially on CRRT however now remains on iHD. She requires Midodrine for persistent hypotension.  Currently in physical rehab following her prolonged hospitalization.   Interval events:   Feels well. No new complaints. Has been eating well since her diet has been liberalized.  Phos replaced yest. DC renvela yesterday.  Unfortunately pelvic US revealed markedly thickened endometrium (62mm) with a 3.7 cm hyperechoic endometrial mass concerning for carcinoma. She will require sampling. Primary DW GYN who recs outpatient follow-up still.    Intake/Output Summary (Last 24 hours) at 05/18/17 0849 Last data filed at 05/17/17 1700  Gross per 24 hour  Intake              400 ml  Output                0 ml  Net              400 ml   Filed Weights   05/17/17 0508 05/18/17 0300 05/18/17 0435  Weight: 151 lb (68.5 kg) 151 lb (68.5 kg) 151 lb 3.2 oz (68.6 kg)   OBJECTIVE Physical Exam   VITALS: Blood pressure 110/75, pulse (!) 109, temperature 97.8 F (36.6 C), temperature source Oral, resp. rate 16, height 5\' 5"  (1.651 m), weight 151 lb 3.2 oz (68.6 kg), SpO2 99 %. GEN: Alert and oriented, pleasant and conversant. Right IJ permacath (placed 04/26/17) CV: IRIR, rate about 100. Diastolic murmur PULM: CTAB, able to speak in complete sentences. Normal WOB on RA.  ABD: Soft, nontender. Not distended. SKIN: No rash. LLE with clean dry bandages.  EXT: Min LE edema.  Current Labs:  BMP Latest Ref Rng & Units 05/17/2017 05/17/2017 05/16/2017  Glucose 65 - 99 mg/dL 112(H) 94 80  BUN 6 - 20 mg/dL 8 <5(L) 29(H)  Creatinine 0.44 - 1.00 mg/dL 1.39(H) 1.03(H) 2.67(H)  Sodium 135 - 145 mmol/L 136 138 135  Potassium 3.5 - 5.1 mmol/L 3.9 3.6 3.7  Chloride 101 - 111 mmol/L 98(L) 101 97(L)  CO2 22 - 32 mmol/L 28 27 26    Calcium 8.9 - 10.3 mg/dL 8.9 8.3(L) 8.8(L)  Phos <1 8/9, 1.9 8/10 post IV repletion  CBC Latest Ref Rng & Units 05/18/2017 05/16/2017 05/15/2017  WBC 4.0 - 10.5 K/uL 12.2(H) 13.7(H) 17.9(H)  Hemoglobin 12.0 - 15.0 g/dL 9.2(L) 8.8(L) 10.8(L)  Hematocrit 36.0 - 46.0 % 29.1(L) 28.5(L) 35.3(L)  Platelets 150 - 400 K/uL 210 195 241   Current IP Medications . amiodarone  200 mg Oral BID  . apixaban  2.5 mg Oral BID  . cephALEXin  250 mg Oral Q12H  . collagenase   Topical Daily  . darbepoetin (ARANESP) injection - DIALYSIS  60 mcg Intravenous Q Wed-HD  . diltiazem  120 mg Oral Daily  . famotidine  20 mg Oral QHS  . feeding supplement (NEPRO CARB STEADY)  237 mL Oral Q1500  . feeding supplement (PRO-STAT SUGAR FREE 64)  30 mL Oral BID  . levothyroxine  100 mcg Oral QAC breakfast  . megestrol  40 mg Oral BID  . midodrine  10 mg Oral TID WC  . multivitamin  1 tablet Oral QHS  . QUEtiapine  25 mg Oral QHS  . senna-docusate  2 tablet Oral QHS  . sertraline  25 mg Oral QHS  Continuous Infusion Medications: None . sodium phosphate  Dextrose 5% IVPB Stopped (05/18/17 0133)   PRN Medications:  acetaminophen, aluminum hydroxide, artificial tears, bisacodyl, diphenhydrAMINE, guaiFENesin-dextromethorphan, heparin, ipratropium-albuterol, ondansetron (ZOFRAN) IV, phenol, polyethylene glycol, prochlorperazine **OR** prochlorperazine **OR** prochlorperazine, zinc oxide  Assessment & Plan:  [name] is a 66yo female recently admitted to [facility] with septic and cardiogenic shock with multiorgan failure including anuric AKI for which she was started on CRRT and transitioned to intermittent HD (MWF). She remains HD dependent however now producing small volumes of urine. She is now in physical rehabilitation following her month long hospitalization. (Note creatinine 09/2014 0.62, and was 1.94 on presentation to [facility] 04/08/17 with LE cellulitis, anasarca, AKI, Serologic work up complements normal, negative  SPEP/UPEP, ANA negative, ANCA negative, GBM negative, negative hepatitis). New issue during rehab vaginal bleeding with finding of thickened endometrium and a mass  1. 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 Midodrine increased to 10 mg TID WC due to hypotension with HD. Wonder if pt dry now? 1. Vein mapping completed, consult [facility]VS towards end of stay 2. HD today for cleaning, doesn't need much volume removed 2. Hypotension - On Midodrine for continued pressure support. BP stable.  1. Continue Midodrine 10mg  3. Anemia, Iron deficiency - S/p 1 dose of aranesp and several days of IV iron replacement. Hb 9.2 today. 1. Monitor 4. Hyperphosphatemia - d/c yest as phos <1. Replaced IV. Improved on Am labs. Diet liberalized yest and expect phos to normalize 1. Liberalized diet/binders stopped 5. A fib - on diltiazem, amiodarone; eliquis 6. Vaginal bleeding - pelvic US with thickened endometrial lining at 22 mm and unfortunately a 3.7cm hyperechoic mass was appreciated. Primary dw GYN who recs outpatient biopsy.  Einar Gip, DO Internal Medicine - PGY2 05/18/2017   I have seen and examined this patient and agree with plan and assessment in the above note with renal recommendations/intervention highlighted. Feels well, eating food brought by husband (we liberalized diet 2/2 low phos). Vol status stable. Lower UF goal with HD today. Midodrine at 10 mg TID with good BP's. New problem vag bleeding with uterine mass/thickened endometrium.For outpt evaluation.   Bryanna Yim B,MD 05/18/2017 12:50 PM

## 2017-05-18 NOTE — Progress Notes (Signed)
Occupational Therapy Session Note  Patient Details  Name: [name] MRN: [id number] Date of Birth: [birth year]  Today's Date: 05/18/2017 OT Individual Time: 7262-0355 and 9741-6384 OT Individual Time Calculation (min): 73 min and 56 min   Skilled Therapeutic Interventions/Progress Updates:    Session 1: 1:1. Pt with no c/o pain. Pt agreeable to bed level bathing for LB. In supported circle sitting pt completes bed level bathing, apply lotion and don pants with A to move BLE into circle sitting and fully thread BLE into pants. Pt rolls B with MIN A for LE management to doff soiled brief from vaginal bleeding, don clean brief and pt advances pants past hips. Pt completes slide board transfer with MOD A EOB>w/c with VC for head hip relationship and increased time. At sink pt completes UB bathing, grooming and UB dressing with supervision. Pt washes/conditions hair at sink with MOD A with VC to use BUE in activity.  Pt brushes and uses blow dyer to style hair seated in w/c with min A to reach back of head. Pt completes 3 sit to stand in stedy with manual facilitation of weight shift forward and MAX A with VC for posture/hip extension. Exited session with pt seated in w/c with call light in reach and all needs met.  Session 2: 1:1. Focus of session on dynamic sitting balance, functional transfers, BUE strength/AAROM and sit to stand. Pt slide board transfer w/c<>EOM with MOD A for scooting and A to place board. Pt reaches laterally in mod ranges outside BOS to obtain playing cards to match on board at shoulder height level with MIN A for reaching full ROM. Pt completes 4 sit to stands with MAX A, manual facilitation of weight shift forward, and VC for posture/hip extension. Pt requires prolonged seated rest breaks 2/2 fatigue. Exited session with pt seated in w/c with call light in reach and all needs met.   Therapy Documentation Precautions:  Precautions Precautions: Fall Restrictions Weight Bearing  Restrictions: No  See Function Navigator for Current Functional Status.   Therapy/Group: Individual Therapy  Tonny Branch 05/18/2017, 10:53 AM

## 2017-05-18 NOTE — Patient Care Conference (Signed)
Inpatient RehabilitationTeam Conference and Plan of Care Update Date: 05/16/2017   Time: 2:45 PM    Patient Name: [name]      Medical Record Number: [id number]  Date of Birth: 09-Aug-1951 Sex: Female         Room/Bed: 4W06C/4W06C-01 Payor Info: Payor: [employer] MEDICARE / Plan: UHC MEDICARE / Product Type: *No Product type* /    Admitting Diagnosis: Critical Illness  Admit Date/Time:  05/07/2017  6:16 PM Admission Comments: No comment available   Primary Diagnosis:  <principal problem not specified> Principal Problem: <principal problem not specified>  Patient Active Problem List   Diagnosis Date Noted  . AKI (acute kidney injury) ([place])   . Bleeding   . A-fib ([place])   . Abnormal urinalysis   . 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 Ulcer of left lower extremity with fat layer exposed ([place])   . Skin ulcer of left foot with fat layer exposed ([place])   . Vaginal bleeding   . Hypoglycemia associated with diabetes ([place])   . Skin ulcer of right heel ([place])   . PAF (paroxysmal atrial fibrillation) ([place])   . Hypotension   . Leukocytosis   . Anemia of chronic disease   . Anxiety state   . Depression   . Prediabetes   . Stage 5 chronic kidney disease not on chronic dialysis ([place])   . 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 Pressure injury of skin 04/19/2017  . Acute renal failure (ARF) ([place])   . Cellulitis of lower extremity   . Edema extremities   . Acute respiratory failure ([place])   . Acute renal failure ([place])   . Multi-organ failure with heart failure ([place])   . Palliative care by specialist   . Goals of care, counseling/discussion   . Septic shock ([place]) 04/08/2017    Expected Discharge Date: Expected Discharge Date: 05/31/17  Team Members Present: Physician leading conference: Dr. Delice Lesch Social Worker Present: Lennart Pall, LCSW Nurse Present: Elliot Cousin, RN PT Present: Leavy Cella, PT OT Present: [name], OT SLP Present: Stormy Fabian, SLP PPS  Coordinator present : Ileana Ladd, PT     Current Status/Progress Goal Weekly Team Focus  Medical   Deficits in mobility and ability to carry out ADL tasks secondary to CIM  Improve mobility, strength, UTI, vaginal bleeding, wounds  See above   Bowel/Bladder   Continent of bowel and bladder; started to make some urine- positive for UTI on antibiotics; HD MWF  LBM 8/7  Mod I  assess and treat for constipation as needed; perform bladder scans post void to assess for emptying   Swallow/Nutrition/ Hydration             ADL's   Max assist sliding board transfers, max-total assist LB bathing and dressing at bed level, +2 for any standing even with use of Stedy  Min assist overall  ADL retraining, BUE strengthening, activity tolerance, transfers   Mobility     Max A sliding board transfers, max A bed mobility   min A w/c level (gait goal for PT only)   strength, activity tolerance, transfers  Communication             Safety/Cognition/ Behavioral Observations            Pain   Denies pain  < 3  Assess and treat for pain q shift and prn   Skin   MASD with shearing to bilateral buttocks; Left leg wounds with santyl, moist-dry dressings  daily; on air overlay mattress  Mod assist  Assess skin q shift and prn; perform dressing changes per MD order    Rehab Goals Patient on target to meet rehab goals: Yes *See Care Plan and progress notes for long and short-term goals.     Barriers to Discharge  Current Status/Progress Possible Resolutions Date Resolved   Physician    Medical stability;Wound Care;Hemodialysis;Lack of/limited family support;Other (comments)  Vaginal bleeding  See above  Therapies, follow labs, abx for suspected UTI, HD recs per [place], Gyn consult      Nursing                  PT                    OT Hemodialysis;Inaccessible home environment;Wound Care                SLP                SW                Discharge Planning/Teaching Needs:  Plan home with  spouse who can provide 24/7 assistance.  Teaching prior to d/c    Team Discussion:  Now with vaginal bleeding - may need to consult gyn.  MD debrided foot on Monday and plan to repeat next week.  Left heel with eschar;  Very poor appetite.  Making slow progress with self-care.  Total assist b/d.  Tf with mod assist but any standing requires +2.  Making progress with tfs.  Need St to re-eval.  Recommend f/u with neuropsych.  Revisions to Treatment Plan:  None    Continued Need for Acute Rehabilitation Level of Care: The patient requires daily medical management by a physician with specialized training in physical medicine and rehabilitation for the following conditions: Daily direction of a multidisciplinary physical rehabilitation program to ensure safe treatment while eliciting the highest outcome that is of practical value to the patient.: Yes Daily medical management of patient stability for increased activity during participation in an intensive rehabilitation regime.: Yes Daily analysis of laboratory values and/or radiology reports with any subsequent need for medication adjustment of medical intervention for : Neurological problems;Blood pressure problems;Diabetes problems;Renal problems;Other  [name] 05/18/2017, 12:59 PM

## 2017-05-18 NOTE — Progress Notes (Signed)
Physical Therapy Note  Patient Details  Name: [name] MRN: [id number] Date of Birth: 08-Oct-1951 Today's Date: 05/18/2017    Time: 1020-1114 54 minutes  1:1 No c/o pain, pt c/o fatigue throughout session.  Sliding board transfer blocked practice with min A for downhill transfer, mod/max A for uphill transfer due to fatigue.  Pt requires cues for leaning forward and for motor planning wt shifts with transfers and scooting fwd/bkwd in w/c.  Pt requires max/total A to scoot back in w/c at end of session.  Sit to stand in stedy with +2 assist.  Seated in stedy pt performed ball toss/tap x 6 minutes with focus on trunk control and balance.  Standing in stedy x 2 with 1 UE support for reaching task with close supervision due to anxiety with standing.  Pt more limited by anxiety this session, PT offered support and encouragement.   DONAWERTH,KAREN 05/18/2017, 11:15 AM

## 2017-05-18 NOTE — Progress Notes (Signed)
Contacted Dr. Roselie Awkward regarding results of ultrasound.  He felt the bleeding complicated by Eliquis on board and recommends follow up on outpatient basis for biopsy. They will try to set her up in their clinic after discharge on 8/23 or she can elect to see her GYN. To monitor for now and call prn if bleeding gets worse or patient decompensates

## 2017-05-19 ENCOUNTER — Inpatient Hospital Stay (HOSPITAL_COMMUNITY): Payer: Medicare Other | Admitting: Occupational Therapy

## 2017-05-19 LAB — RENAL FUNCTION PANEL
ANION GAP: 8 (ref 5–15)
Albumin: 3 g/dL — ABNORMAL LOW (ref 3.5–5.0)
BUN: 9 mg/dL (ref 6–20)
CALCIUM: 8.2 mg/dL — AB (ref 8.9–10.3)
CHLORIDE: 101 mmol/L (ref 101–111)
CO2: 28 mmol/L (ref 22–32)
Creatinine, Ser: 1.11 mg/dL — ABNORMAL HIGH (ref 0.44–1.00)
GFR calc non Af Amer: 51 mL/min — ABNORMAL LOW (ref >60)
GFR, EST AFRICAN AMERICAN: 59 mL/min — AB (ref >60)
Glucose, Bld: 77 mg/dL (ref 65–99)
Phosphorus: 1.1 mg/dL — ABNORMAL LOW (ref 2.5–4.6)
Potassium: 4.2 mmol/L (ref 3.5–5.1)
Sodium: 137 mmol/L (ref 135–145)

## 2017-05-19 LAB — GLUCOSE, CAPILLARY
GLUCOSE-CAPILLARY: 108 mg/dL — AB (ref 65–99)
GLUCOSE-CAPILLARY: 112 mg/dL — AB (ref 65–99)
GLUCOSE-CAPILLARY: 94 mg/dL (ref 65–99)
Glucose-Capillary: 81 mg/dL (ref 65–99)

## 2017-05-19 MED ORDER — SODIUM PHOSPHATES 45 MMOLE/15ML IV SOLN
10.0000 mmol | Freq: Once | INTRAVENOUS | Status: AC
  Administered 2017-05-19: 10 mmol via INTRAVENOUS
  Filled 2017-05-19: qty 3.33

## 2017-05-19 MED ORDER — SODIUM PHOSPHATES 45 MMOLE/15ML IV SOLN
20.0000 mmol | Freq: Once | INTRAVENOUS | Status: DC
  Filled 2017-05-19: qty 6.67

## 2017-05-19 MED ORDER — HYDROCERIN EX CREA
TOPICAL_CREAM | Freq: Two times a day (BID) | CUTANEOUS | Status: DC
  Administered 2017-05-19 – 2017-06-02 (×26): via TOPICAL
  Administered 2017-06-02: 1 via TOPICAL
  Filled 2017-05-19: qty 113

## 2017-05-19 NOTE — Progress Notes (Signed)
Pt having yellow/green thick vaginal discharge, odor present, along with vaginal bleeding. MD, Posey Pronto notified with findings. New order for a genital wet prep swab per MD. RN will continue to monitor bleeding and secretions.

## 2017-05-19 NOTE — Progress Notes (Signed)
[facility] Rounding Note  SUBJECTIVE    Interval events:   Feels well. States she had HD yesterday and blood pressure was stable. Has been eating well since her diet has been liberalized.   When I came by to see pt was in gym doing rehab. Per RN more vaginal bleeding as well as a greenish vag discharge Apparently tolerated HD better yesterday, no volume removed (in fact based on weights looks like she went from 68.5 to 70 but no boluses reported so suspect weights not accurate.    Intake/Output Summary (Last 24 hours) at 05/19/17 1028 Last data filed at 05/18/17 2300  Gross per 24 hour  Intake              462 ml  Output              150 ml  Net              312 ml   Filed Weights   05/18/17 2053 05/18/17 2124 05/19/17 0446  Weight: 151 lb 0.2 oz (68.5 kg) 154 lb 5.2 oz (70 kg) 153 lb 10.6 oz (69.7 kg)   OBJECTIVE Physical Exam   VITALS: Blood pressure 110/75, pulse (!) 109, temperature 97.8 F (36.6 C), temperature source Oral, resp. rate 16, height 5\' 5"  (1.651 m), weight 151 lb 3.2 oz (68.6 kg), SpO2 99 %. GEN: Alert and oriented, pleasant and conversant. Right IJ permacath (placed 04/26/17) CV: IRIR, Diastolic murmur PULM: CTAB, able to speak in complete sentences. Normal WOB on RA.  ABD: Soft, nontender. Not distended. SKIN: No rash. LLE with clean dry bandages.  EXT: 1+ pitting LE edema.  Basic Metabolic Panel:  Recent Labs  05/18/17 0935 05/19/17 0553  NA 138 137  K 3.4* 4.2  CL 101 101  CO2 26 28  GLUCOSE 122* 77  BUN 36* 9  CREATININE 2.63* 1.11*  CALCIUM 8.9 8.2*  PHOS 1.8* 1.1*    Recent Labs  05/18/17 0935 05/19/17 0553  ALBUMIN 3.4* 3.0*    Recent Labs  05/16/17 1946 05/18/17 0451  WBC 13.7* 12.2*  NEUTROABS  --  8.9*  HGB 8.8* 9.2*  HCT 28.5* 29.1*  MCV 101.1* 101.7*  PLT 195 210   Current IP Medications . amiodarone  200 mg Oral BID  . apixaban  2.5 mg Oral BID  . collagenase   Topical Daily  . darbepoetin (ARANESP) injection - DIALYSIS   60 mcg Intravenous Q Wed-HD  . diltiazem  120 mg Oral Daily  . famotidine  20 mg Oral QHS  . feeding supplement (NEPRO CARB STEADY)  237 mL Oral Q1500  . feeding supplement (PRO-STAT SUGAR FREE 64)  30 mL Oral BID  . levothyroxine  100 mcg Oral QAC breakfast  . megestrol  40 mg Oral BID  . midodrine  10 mg Oral TID WC  . multivitamin  1 tablet Oral QHS  . QUEtiapine  25 mg Oral QHS  . senna-docusate  2 tablet Oral QHS  . sertraline  25 mg Oral QHS   Continuous Infusion Medications: None . sodium phosphate  Dextrose 5% IVPB Stopped (05/18/17 0133)   PRN Medications:  acetaminophen, aluminum hydroxide, artificial tears, bisacodyl, diphenhydrAMINE, guaiFENesin-dextromethorphan, ipratropium-albuterol, ondansetron (ZOFRAN) IV, phenol, polyethylene glycol, prochlorperazine **OR** prochlorperazine **OR** prochlorperazine, zinc oxide  Assessment & Plan:  [name] is a 66yo female recently admitted to [facility] with septic and cardiogenic shock with multiorgan failure including anuric AKI for which she was started on CRRT and transitioned to  intermittent HD (MWF). She remains HD dependent however now producing small volumes of urine. She is now in physical rehabilitation following her month long hospitalization. (Note creatinine 09/2014 0.62, and was 1.94 on presentation to [facility] 04/08/17 with LE cellulitis, anasarca, AKI, Serologic work up complements normal, negative SPEP/UPEP, ANA negative, ANCA negative, GBM negative, negative hepatitis).  New issue during rehab vaginal bleeding with finding of thickened endometrium and a mass  1. 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 300cc UOP yesterday 1. Vein mapping completed, consult VVS towards end of stay 2. Hypotension - On Midodrine for continued pressure support. BP stable.  1. Continue Midodrine 10mg  tid 3. Anemia, Iron deficiency - S/p 1 dose of aranesp 60 ono 8/8,  and 2 doses of Ferrlecit 250 each 8/5  4. Hb 9.2  yesterday. 1. Monitor 5. Hyperphosphatemia - on 8/9 phos was <1.0, 8/10 was 1.8 and currently 1.1.  1. Liberalized diet/binders stopped 2. IV sodium phos 10 again today for recurrent low phos 6. A fib - on diltiazem, amiodarone; eliquis 7. Vaginal bleeding - pelvic US with thickened endometrial lining at 22 mm and a 3.7cm hyperechoic mass was noted. Primary dw GYN who recs outpatient biopsy.  [name] PGY-2 Internal Medicine   I have seen and examined this patient and agree with plan and assessment in the above note with renal recommendations/intervention highlighted. MWF HD. Repleting phos. Check in AM. Midodrine for hypotension. Ongoing issue w/vag bleeding. Getting megace.  [name] B,MD 05/19/2017 2:02 PM

## 2017-05-19 NOTE — Progress Notes (Signed)
[place] PHYSICAL MEDICINE & REHABILITATION     PROGRESS NOTE  Subjective/Complaints:  Seen lying in bed this morning. She slept well overnight. Per nursing bleeding reported. Per patient "not bad"  ROS: +Vaginal bleeding. Denies nausea, vomiting, diarrhea, shortness of breath or chest pain   Objective: Vital Signs: Blood pressure 95/63, pulse (!) 108, temperature 97.8 F (36.6 C), temperature source Oral, resp. rate 16, height 5\' 5"  (1.651 m), weight 69.7 kg (153 lb 10.6 oz), SpO2 100 %. US Transvaginal Non-ob  Result Date: 05/17/2017 CLINICAL DATA:  Vaginal bleeding. EXAM: TRANSABDOMINAL AND TRANSVAGINAL ULTRASOUND OF PELVIS TECHNIQUE: Both transabdominal and transvaginal ultrasound examinations of the pelvis were performed. Transabdominal technique was performed for global imaging of the pelvis including uterus, ovaries, adnexal regions, and pelvic cul-de-sac. It was necessary to proceed with endovaginal exam following the transabdominal exam to visualize the endometrium. COMPARISON:  None. FINDINGS: Uterus Measurements: 7.2 x 3.7 x 5.1 cm. No fibroids. Endometrium Thickness: 22 mm, abnormal. There is a 3.7 x 2.0 x 3.4 cm hyperechoic endometrial mass and a small amount of endometrial fluid. Right ovary Not well visualized. Possible 2.7 x 1.6 x 1.4 cm cystic lesion in the right adnexa. Left ovary Not visualized.  No adnexal mass. Other findings Trace free fluid. IMPRESSION: 1. Markedly abnormal and thickened endometrium containing a focal 3.7 cm hyperechoic mass. In the setting of post-menopausal bleeding, endometrial sampling is indicated to exclude carcinoma. If results are benign, sonohysterogram should be considered for focal lesion work-up prior to hysteroscopy. (Ref: Radiological Reasoning: Algorithmic Workup of Abnormal Vaginal Bleeding with Endovaginal Sonography and Sonohysterography. AJR 2008; 025:K27-06) 2. Possible 2.7 cm cystic lesion in the right adnexa, not well seen on transvaginal  images. This is almost certainly benign, but follow up ultrasound is recommended in 1 year according to the Society of Radiologists in [place] Statement (D Clovis Riley et al. Management of Asymptomatic Ovarian and Other Adnexal Cysts Imaged at [place]: Society of Radiologists in [place] Statement 2010. Radiology 256 (Sept 2010): 237-628.). Electronically Signed   By: Titus Dubin M.D.   On: 05/17/2017 11:39   US Pelvis Complete  Result Date: 05/17/2017 CLINICAL DATA:  Vaginal bleeding. EXAM: TRANSABDOMINAL AND TRANSVAGINAL ULTRASOUND OF PELVIS TECHNIQUE: Both transabdominal and transvaginal ultrasound examinations of the pelvis were performed. Transabdominal technique was performed for global imaging of the pelvis including uterus, ovaries, adnexal regions, and pelvic cul-de-sac. It was necessary to proceed with endovaginal exam following the transabdominal exam to visualize the endometrium. COMPARISON:  None. FINDINGS: Uterus Measurements: 7.2 x 3.7 x 5.1 cm. No fibroids. Endometrium Thickness: 22 mm, abnormal. There is a 3.7 x 2.0 x 3.4 cm hyperechoic endometrial mass and a small amount of endometrial fluid. Right ovary Not well visualized. Possible 2.7 x 1.6 x 1.4 cm cystic lesion in the right adnexa. Left ovary Not visualized.  No adnexal mass. Other findings Trace free fluid. IMPRESSION: 1. Markedly abnormal and thickened endometrium containing a focal 3.7 cm hyperechoic mass. In the setting of post-menopausal bleeding, endometrial sampling is indicated to exclude carcinoma. If results are benign, sonohysterogram should be considered for focal lesion work-up prior to hysteroscopy. (Ref: Radiological Reasoning: Algorithmic Workup of Abnormal Vaginal Bleeding with Endovaginal Sonography and Sonohysterography. AJR 2008; 315:V76-16) 2. Possible 2.7 cm cystic lesion in the right adnexa, not well seen on transvaginal images. This is almost certainly benign, but follow  up ultrasound is recommended in 1 year according to the Society of Radiologists in [place] (D  Clovis Riley et al. Management of Asymptomatic Ovarian and Other Adnexal Cysts Imaged at [place]: Society of Radiologists in [place] Statement 2010. Radiology 256 (Sept 2010): 644-034.). Electronically Signed   By: Titus Dubin M.D.   On: 05/17/2017 11:39    Recent Labs  05/16/17 1946 05/18/17 0451  WBC 13.7* 12.2*  HGB 8.8* 9.2*  HCT 28.5* 29.1*  PLT 195 210    Recent Labs  05/17/17 1006 05/18/17 0935  NA 136 138  K 3.9 3.4*  CL 98* 101  GLUCOSE 112* 122*  BUN 8 36*  CREATININE 1.39* 2.63*  CALCIUM 8.9 8.9   CBG (last 3)   Recent Labs  05/18/17 1216 05/18/17 2115 05/19/17 0649  GLUCAP 112* 71 81    Wt Readings from Last 3 Encounters:  05/19/17 69.7 kg (153 lb 10.6 oz)  05/07/17 75.3 kg (166 lb 0.1 oz)    Physical Exam:  BP 95/63 (BP Location: Left Arm)   Pulse (!) 108   Temp 97.8 F (36.6 C) (Oral)   Resp 16   Ht 5\' 5"  (1.651 m)   Wt 69.7 kg (153 lb 10.6 oz)   SpO2 100%   BMI 25.57 kg/m  Constitutional: She appears well-developed and well-nourished.  HENT: Normocephalic. Atraumatic. Eyes: EOMI. No discharge.  Cardiovascular: IRIR Respiratory: CTA Bilaterally. Normal effort.  GI: Soft. Bowel sounds are normal.   Musculoskeletal: She exhibits edema and tenderness.  Neurological: She is alert and oriented.  Able to follow commands without difficulty.  Motor: B/l UE 4/5 proximal to distal B/l LE: HF 3-/5, KE 3/5, ADF/PF 3+/5  Skin: LLE with ulcerations with eschar on dorsum of foot and distal tibia with dressings in place. Psychiatric: She has a normal mood and affect. Her behavior is normal. Thought content normal.    Assessment/Plan: 1. 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 Physiatrist is providing close  team supervision and 24 hour management of active medical problems listed below. Physiatrist and rehab team continue to assess barriers to discharge/monitor patient progress toward functional and medical goals.  Function:  Bathing Bathing position   Position: Bed  Bathing parts Body parts bathed by patient: Right arm, Left arm, Chest, Abdomen, Front perineal area, Right upper leg, Left upper leg Body parts bathed by helper: Front perineal area, Buttocks, Back  Bathing assist Assist Level: Touching or steadying assistance(Pt > 75%)      Upper Body Dressing/Undressing Upper body dressing   What is the patient wearing?: Pull over shirt/dress     Pull over shirt/dress - Perfomed by patient: Put head through opening, Thread/unthread left sleeve, Pull shirt over trunk, Thread/unthread right sleeve Pull over shirt/dress - Perfomed by helper: Put head through opening, Pull shirt over trunk        Upper body assist Assist Level: Touching or steadying assistance(Pt > 75%)      Lower Body Dressing/Undressing Lower body dressing   What is the patient wearing?: Pants     Pants- Performed by patient: Pull pants up/down Pants- Performed by helper: Thread/unthread right pants leg, Thread/unthread left pants leg   Non-skid slipper socks- Performed by helper: Don/doff right sock                  Lower body assist Assist for lower body dressing: Touching or steadying assistance (Pt > 75%) (Total assist)      Toileting Toileting Toileting activity did not occur: Refused  Toileting steps completed by helper: Adjust clothing prior to toileting, Performs perineal hygiene, Adjust clothing after toileting    Toileting assist Assist level: Two helpers   Transfers Chair/bed transfer   Chair/bed transfer method: Lateral scoot Chair/bed transfer assist level: Moderate assist (Pt 50 - 74%/lift or lower) (level surface) Chair/bed transfer assistive device: Sliding board,  Armrests Mechanical lift: Stedy   Locomotion Ambulation Ambulation activity did not occur: Safety/medical concerns         Wheelchair   Type: Manual Max wheelchair distance: 10' Assist Level: Touching or steadying assistance (Pt > 75%)  Cognition Comprehension Comprehension assist level: Follows basic conversation/direction with no assist  Expression Expression assist level: Expresses basic needs/ideas: With no assist  Social Interaction Social Interaction assist level: Interacts appropriately with others - No medications needed.  Problem Solving Problem solving assist level: Solves complex problems: With extra time  Memory Memory assist level: More than reasonable amount of time    Medical Problem List and Plan: 1.  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  DVT Prophylaxis/Anticoagulation: Pharmaceutical: Other (comment)--Eliquis, may need to d/c if bleeding persists 3. Pain Management: tylenol prn 4. Mood: LCSW to follow for evaluation and support.  5. Neuropsych: This patient is capable of making decisions on her own behalf. 6. Skin/Wound Care: routine pressure relief measures. Prevalon boot for heel ulcer.  7. Fluids/Electrolytes/Nutrition: Strict I/Os.   8.  Hypotension: Monitor orthostatic BP. Added abdominal binder and TEDs to help with BP support.    Vitals:   05/18/17 2124 05/19/17 0446  BP: 121/75 95/63  Pulse: (!) 115 (!) 108  Resp: 18 16  Temp: 98 F (36.7 C) 97.8 F (36.6 C)  SpO2:  100%   Controlled 8/11 9. Paroxysmal Afib:  on eliquis for stroke prevention. Continue amiodarone bid and Cardizem daily.  10. Leukocytosis:              Patient with ulcers--continue with WOC recs/daily dressing             Afebrile             WBCs 12.2 on 8/10             Cont to monitor 11. Anemia of chronic disease: On epogen  MWF.              Hb 9.2 on 8/10  Labs ordered for Monday             Cont to montior 12. Left  foot/leg ulcer: Daily dressing changes with santyl. Monitor for signs of infection.   Debrided 8/6, plan to debride again 8/13 13. Anxiety/depression: On Seroquel and Zoloft at nights.  14. Prediabetes: CBG (last 3)   Recent Labs  05/18/17 1216 05/18/17 2115 05/19/17 0649  GLUCAP 112* 71 81   Relatively controlled 8/11 15. CKD now HD dependent: HD on MWF at end of day to help with tolerance of therapy. Renal diet--Reducate patient on appropriate diet.  16. Vaginal bleeding  Per PCP no issues prior  Gyn consulted, Megace initiated, transvaginal U/S showing endometrial thickening and mass.  Contacted Gyn, per recs, outpt workup to rule out CA. 17. Abnormal UA  UA +, Ucx with multiple species  Cont Keflex x7 days started 8/8 18. AKI  Cr 1.39 on 8/9  Encourage fluids  Cont to monitor  LOS (Days) 12 A FACE TO FACE EVALUATION WAS PERFORMED  Alaijah Gibler Lorie Phenix 05/19/2017 8:25 AM

## 2017-05-19 NOTE — Progress Notes (Signed)
Occupational Therapy Session Note  Patient Details  Name: [name] MRN: [id number] Date of Birth: 1950/11/22  Today's Date: 05/19/2017 OT Individual Time: 9678-9381 OT Individual Time Calculation (min): 70 min    Short Term Goals: Week 2:  OT Short Term Goal 1 (Week 2): Pt will complete sit > stand in [place] with one person assist to decrease burden of care OT Short Term Goal 2 (Week 2): Pt will don pants with max assist at bed level utilizing AE PRN and circle sitting technique OT Short Term Goal 3 (Week 2): Pt will don shirt with supervision in unsupported sitting on EOB OT Short Term Goal 4 (Week 2): Pt will complete slide board transfer with mod assist of one caregiver  Skilled Therapeutic Interventions/Progress Updates:    Treatment session with focus on LB dressing and trunk control to improve transfers and transitional movements.  Pt received semi-reclined in bed reporting no pain.  Engaged in [place] sitting upright in bed with min assist to maintain upright sitting balance.  Therapist providing assist to position legs into partial circle sitting with one leg at a time to don pants.  Pt able to thread B pant legs with assist to obtain position and increased time.  Engaged in rolling at bed level to pull pants over hips, with pt able to pull pants up most of the way ultimately requiring assist to pull pants up in back.  Mod assist bed mobility and slide board transfer to w/c.  Manual facilitation for anterior weight shift and lift off to decrease shearing on bottom.  Utilized Geologist, engineering in gym to provide additional input while addressing weight shifting with anterior and posterior scooting on mat with focus on isolating one hip at a time.  Attempted block pushpus for BUE and BLE strengthening as well as education on lifting buttocks for slide board transfers.  Pt unable to lift buttocks during any attempt.  Therapist positioned in front of pt to facilitate anterior weight shift and provide  support as pt reports anxious with increased weight shift.  Engaged in 5 anterior weight shifts with pt BUE on arm chair in front to increase weight shift and lift off.  Returned to w/c with use of slide board and therapist seated in front to facilitate anterior weight shift.  Therapy Documentation Precautions:  Precautions Precautions: Fall Restrictions Weight Bearing Restrictions: No Pain:  Pt with no c/o pain  See Function Navigator for Current Functional Status.   Therapy/Group: Individual Therapy  [name] 05/19/2017, 2:57 PM

## 2017-05-19 NOTE — Plan of Care (Signed)
Problem: RH BOWEL ELIMINATION Goal: RH STG MANAGE BOWEL WITH ASSISTANCE STG Manage Bowel with min Assistance.  Outcome: Not Progressing No BM > 3 days. Goal: RH STG MANAGE BOWEL W/MEDICATION W/ASSISTANCE STG Manage Bowel with Medication with min Assistance.  Outcome: Not Progressing Refused scheduled senna s  Problem: RH SKIN INTEGRITY Goal: RH STG ABLE TO PERFORM INCISION/WOUND CARE W/ASSISTANCE STG Able To Perform Incision/Wound Care With max Assistance.  Outcome: Not Progressing Total assistance with dressing changes

## 2017-05-19 NOTE — Progress Notes (Signed)
Vaginal bleeding, 150cc's bloody urine. (+) swelling to  BLE's , left > right. Dressing changed to LLE per orders. Refused scheduled protein supplement. Denies pain. Declined to wear PREVALON boots, bilateral heels elevated off bed with pillows. LLE dressing changed per orders. Patrici Ranks A

## 2017-05-20 ENCOUNTER — Inpatient Hospital Stay (HOSPITAL_COMMUNITY): Payer: Medicare Other

## 2017-05-20 DIAGNOSIS — N186 End stage renal disease: Secondary | ICD-10-CM | POA: Diagnosis present

## 2017-05-20 DIAGNOSIS — N898 Other specified noninflammatory disorders of vagina: Secondary | ICD-10-CM | POA: Insufficient documentation

## 2017-05-20 LAB — RENAL FUNCTION PANEL
Albumin: 2.9 g/dL — ABNORMAL LOW (ref 3.5–5.0)
Anion gap: 8 (ref 5–15)
BUN: 34 mg/dL — ABNORMAL HIGH (ref 6–20)
CALCIUM: 8.4 mg/dL — AB (ref 8.9–10.3)
CO2: 28 mmol/L (ref 22–32)
Chloride: 100 mmol/L — ABNORMAL LOW (ref 101–111)
Creatinine, Ser: 2.42 mg/dL — ABNORMAL HIGH (ref 0.44–1.00)
GFR calc Af Amer: 23 mL/min — ABNORMAL LOW (ref >60)
GFR, EST NON AFRICAN AMERICAN: 20 mL/min — AB (ref >60)
Glucose, Bld: 87 mg/dL (ref 65–99)
PHOSPHORUS: 3.2 mg/dL (ref 2.5–4.6)
Potassium: 4.1 mmol/L (ref 3.5–5.1)
Sodium: 136 mmol/L (ref 135–145)

## 2017-05-20 LAB — GLUCOSE, CAPILLARY
GLUCOSE-CAPILLARY: 93 mg/dL (ref 65–99)
GLUCOSE-CAPILLARY: 90 mg/dL (ref 65–99)
GLUCOSE-CAPILLARY: 119 mg/dL — AB (ref 65–99)

## 2017-05-20 NOTE — Progress Notes (Signed)
Occupational Therapy Session Note  Patient Details  Name: [name] MRN: [id number] Date of Birth: November 23, 1950  Today's Date: 05/20/2017 OT Individual Time: 8453-6468 and 1500-1530 OT Individual Time Calculation (min): 30 min  Make up time and 30 min    Short Term Goals: Week 2:  OT Short Term Goal 1 (Week 2): Pt will complete sit > stand in [place] with one person assist to decrease burden of care OT Short Term Goal 2 (Week 2): Pt will don pants with max assist at bed level utilizing AE PRN and circle sitting technique OT Short Term Goal 3 (Week 2): Pt will don shirt with supervision in unsupported sitting on EOB OT Short Term Goal 4 (Week 2): Pt will complete slide board transfer with mod assist of one caregiver  Skilled Therapeutic Interventions/Progress Updates:    1:1. Pt agreeable to bathe and dress at bed level. Pt rolls B throughout session iwht MIN A for LE management to doff pants with MOD A. Pt bathes in supported long sitting with A to wash back and min  A to bring  LEs into figure 4. Pt declines washing buttocks/peri area. Pt dons shirt with min A to pull shirt down back. Pt able to thread LLE into pants leg with A to bring LE into figure four and requires A to thread RLE into pants leg. Pt rolls as stated above to advance pants past hips. Pt applies lotion to BLE and able to reach farther down legs without losing sitting balance than in previous sessions. Exited session with pt seated in bed with call light in reach and all needs met.   Session 2: Pt supine >EOB with min A for trunk facilitation. Pt slide board trasnfers on even surface EOB>w/c<>EOM with MOD A for lifting and VC for head hip relationship. Pt completes 6 sit to stand with MAX A for lifting and lowering and +2 blocking R knee. OT provides tactile cues for posture and hip extension. Pt practices during 1 trial shifting weight laterally with MOD A for balance. Pt benefitting from visual feedback in mirror and VC to lift  chest/head. Exited session with pt seated in w/c with call light in reach and all needs met. No c/o pain throughout session.  Therapy Documentation Precautions:  Precautions Precautions: Fall Restrictions Weight Bearing Restrictions: No  See Function Navigator for Current Functional Status.   Therapy/Group: Individual Therapy  Tonny Branch 05/20/2017, 7:39 AM

## 2017-05-20 NOTE — Progress Notes (Signed)
[place] PHYSICAL MEDICINE & REHABILITATION     PROGRESS NOTE  Subjective/Complaints:  Patient seen lying bed this morning. She states she slept well overnight. She is getting her blood drawn. She has questions about debriding her left lower extremity.  ROS: + Vaginal discharge. Denies nausea, vomiting, diarrhea, shortness of breath or chest pain   Objective: Vital Signs: Blood pressure 114/72, pulse (!) 106, temperature 98.4 F (36.9 C), temperature source Oral, resp. rate 16, height 5\' 5"  (1.651 m), weight 70 kg (154 lb 5.2 oz), SpO2 100 %. No results found.  Recent Labs  05/18/17 0451  WBC 12.2*  HGB 9.2*  HCT 29.1*  PLT 210    Recent Labs  05/19/17 0553 05/20/17 0608  NA 137 136  K 4.2 4.1  CL 101 100*  GLUCOSE 77 87  BUN 9 34*  CREATININE 1.11* 2.42*  CALCIUM 8.2* 8.4*   CBG (last 3)   Recent Labs  05/19/17 1619 05/19/17 2107 05/20/17 0634  GLUCAP 112* 94 93    Wt Readings from Last 3 Encounters:  05/20/17 70 kg (154 lb 5.2 oz)  05/07/17 75.3 kg (166 lb 0.1 oz)    Physical Exam:  BP 114/72 (BP Location: Right Arm)   Pulse (!) 106   Temp 98.4 F (36.9 C) (Oral)   Resp 16   Ht 5\' 5"  (1.651 m)   Wt 70 kg (154 lb 5.2 oz)   SpO2 100%   BMI 25.68 kg/m  Constitutional: She appears well-developed and well-nourished.  HENT: Normocephalic. Atraumatic. Eyes: EOMI. No discharge.  Cardiovascular: IRIR Respiratory: CTA Bilaterally. Normal effort.  GI: Soft. Bowel sounds are normal.   Musculoskeletal: She exhibits edema and tenderness.  Neurological: She is alert and oriented.  Able to follow commands without difficulty.  Motor: B/l UE 4/5 proximal to distal B/l LE: HF 3-/5, KE 3/5, ADF/PF 3+/5 (stable) Skin: LLE with ulcerations with eschar on dorsum of foot and distal tibia with dressings in place. Psychiatric: She has a normal mood and affect. Her behavior is normal. Thought content normal.    Assessment/Plan: 1. 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 Physiatrist is providing close team supervision and 24 hour management of active medical problems listed below. Physiatrist and rehab team continue to assess barriers to discharge/monitor patient progress toward functional and medical goals.  Function:  Bathing Bathing position   Position: Bed  Bathing parts Body parts bathed by patient: Right arm, Left arm, Chest, Abdomen, Front perineal area, Right upper leg, Left upper leg Body parts bathed by helper: Front perineal area, Buttocks, Back  Bathing assist Assist Level: Touching or steadying assistance(Pt > 75%)      Upper Body Dressing/Undressing Upper body dressing   What is the patient wearing?: Pull over shirt/dress     Pull over shirt/dress - Perfomed by patient: Put head through opening, Thread/unthread left sleeve, Pull shirt over trunk, Thread/unthread right sleeve Pull over shirt/dress - Perfomed by helper: Put head through opening, Pull shirt over trunk        Upper body assist Assist Level: Touching or steadying assistance(Pt > 75%)      Lower Body Dressing/Undressing Lower body dressing   What is the patient wearing?: Pants     Pants- Performed by patient: Pull pants up/down Pants- Performed by helper: Thread/unthread right pants leg, Thread/unthread left pants leg   Non-skid slipper socks- Performed by helper: Don/doff right sock  Lower body assist Assist for lower body dressing: Touching or steadying assistance (Pt > 75%) (Total assist)      Toileting Toileting Toileting activity did not occur: Refused   Toileting steps completed by helper: Adjust clothing prior to toileting, Performs perineal hygiene, Adjust clothing after toileting    Toileting assist Assist level: Two helpers   Transfers Chair/bed transfer   Chair/bed transfer method: Lateral scoot Chair/bed transfer  assist level: Moderate assist (Pt 50 - 74%/lift or lower) (level surface) Chair/bed transfer assistive device: Sliding board, Armrests Mechanical lift: Stedy   Locomotion Ambulation Ambulation activity did not occur: Safety/medical concerns         Wheelchair   Type: Manual Max wheelchair distance: 10' Assist Level: Touching or steadying assistance (Pt > 75%)  Cognition Comprehension Comprehension assist level: Follows basic conversation/direction with no assist  Expression Expression assist level: Expresses basic needs/ideas: With no assist  Social Interaction Social Interaction assist level: Interacts appropriately with others - No medications needed.  Problem Solving Problem solving assist level: Solves complex problems: With extra time  Memory Memory assist level: More than reasonable amount of time    Medical Problem List and Plan: 1.  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  DVT Prophylaxis/Anticoagulation: Pharmaceutical: Other (comment)--Eliquis, may need to d/c if bleeding persists 3. Pain Management: tylenol prn 4. Mood: LCSW to follow for evaluation and support.  5. Neuropsych: This patient is capable of making decisions on her own behalf. 6. Skin/Wound Care: routine pressure relief measures. Prevalon boot for heel ulcer.  7. Fluids/Electrolytes/Nutrition: Strict I/Os.   8.  Hypotension: Monitor orthostatic BP. Added abdominal binder and TEDs to help with BP support.    Vitals:   05/19/17 1500 05/20/17 0548  BP: 112/77 114/72  Pulse: 89 (!) 106  Resp: 18 16  Temp: 98.4 F (36.9 C) 98.4 F (36.9 C)  SpO2: 100% 100%   Controlled 8/12 9. Paroxysmal Afib:  on eliquis for stroke prevention. Continue amiodarone bid and Cardizem daily.  10. Leukocytosis:              Patient with ulcers--continue with WOC recs/daily dressing             Afebrile             WBCs 12.2 on 8/10             Cont to monitor 11. Anemia of  chronic disease: On epogen  MWF.              Hb 9.2 on 8/10  Labs ordered for Monday             Cont to montior 12. Left foot/leg ulcer: Daily dressing changes with santyl. Monitor for signs of infection.   Debrided 8/6, plan to debride again 8/13 13. Anxiety/depression: On Seroquel and Zoloft at nights.  14. Prediabetes: CBG (last 3)   Recent Labs  05/19/17 1619 05/19/17 2107 05/20/17 0634  GLUCAP 112* 94 93   Relatively controlled 8/12 15. CKD now HD dependent: HD on MWF at end of day to help with tolerance of therapy. Renal diet--Reducate patient on appropriate diet.  16. Vaginal bleeding  Per PCP no issues prior  Gyn consulted, Megace initiated, transvaginal U/S showing endometrial thickening and mass.  Contacted Gyn, per recs, outpt workup to rule out CA. 17. Abnormal UA  UA +, Ucx with multiple species  Cont Keflex x7 days started 8/8 18.  AKI  Cr 1.39 on 8/9  Encourage fluids  Cont to monitor 19. Vaginal discharge  Specimen sent for analysis on 8/11  LOS (Days) 13 A FACE TO FACE EVALUATION WAS PERFORMED  Ankit Lorie Phenix 05/20/2017 7:32 AM

## 2017-05-20 NOTE — Progress Notes (Signed)
[facility] Rounding Note  SUBJECTIVE    Interval events:   Feels well.  Continues to have vaginal discharge and bleeding. Wet prep only ordered yesterday however has yet to be completed.  Eating much better since diet liberalized.    Intake/Output Summary (Last 24 hours) at 05/20/17 0859 Last data filed at 05/19/17 2200  Gross per 24 hour  Intake              900 ml  Output                0 ml  Net              900 ml   Filed Weights   05/18/17 2124 05/19/17 0446 05/20/17 0548  Weight: 154 lb 5.2 oz (70 kg) 153 lb 10.6 oz (69.7 kg) 154 lb 5.2 oz (70 kg)   OBJECTIVE Physical Exam   VITALS: Blood pressure 110/75, pulse (!) 109, temperature 97.8 F (36.6 C), temperature source Oral, resp. rate 16, height 5\' 5"  (1.651 m), weight 151 lb 3.2 oz (68.6 kg), SpO2 99 %. GEN: Alert and oriented, pleasant and conversant. Right IJ permacath (placed 04/26/17) CV: IRIR, Diastolic murmur PULM: CTAB, able to speak in complete sentences. Normal WOB on RA.  ABD: Soft, nontender. Not distended. SKIN: No rash. LLE with clean dry bandages.  EXT: Minimal LE edema.  Basic Metabolic Panel:  Recent Labs  05/19/17 0553 05/20/17 0608  NA 137 136  K 4.2 4.1  CL 101 100*  CO2 28 28  GLUCOSE 77 87  BUN 9 34*  CREATININE 1.11* 2.42*  CALCIUM 8.2* 8.4*  PHOS 1.1* 3.2    Recent Labs  05/19/17 0553 05/20/17 0608  ALBUMIN 3.0* 2.9*    Recent Labs  05/18/17 0451  WBC 12.2*  NEUTROABS 8.9*  HGB 9.2*  HCT 29.1*  MCV 101.7*  PLT 210   Current IP Medications . amiodarone  200 mg Oral BID  . apixaban  2.5 mg Oral BID  . collagenase   Topical Daily  . darbepoetin (ARANESP) injection - DIALYSIS  60 mcg Intravenous Q Wed-HD  . diltiazem  120 mg Oral Daily  . famotidine  20 mg Oral QHS  . feeding supplement (NEPRO CARB STEADY)  237 mL Oral Q1500  . feeding supplement (PRO-STAT SUGAR FREE 64)  30 mL Oral BID  . hydrocerin   Topical BID  . levothyroxine  100 mcg Oral QAC breakfast  . megestrol   40 mg Oral BID  . midodrine  10 mg Oral TID WC  . multivitamin  1 tablet Oral QHS  . QUEtiapine  25 mg Oral QHS  . senna-docusate  2 tablet Oral QHS  . sertraline  25 mg Oral QHS   Continuous Infusion Medications: None  PRN Medications:  acetaminophen, aluminum hydroxide, artificial tears, bisacodyl, diphenhydrAMINE, guaiFENesin-dextromethorphan, ipratropium-albuterol, ondansetron (ZOFRAN) IV, phenol, polyethylene glycol, prochlorperazine **OR** prochlorperazine **OR** prochlorperazine, zinc oxide  Assessment & Plan:   66yo female admitted to [facility] 04/08/17 with septic/cardiogenic shock with multiorgan failure including anuric AKI for which she was started on CRRT and transitioned to intermittent HD (MWF). She remains HD dependent producing small volumes of urine. She is now in physical rehabilitation (transferred here 05/08/17) (On record review creatinine 09/2014 0.62, 1.94 on presentation to [facility] 04/08/17 with LE cellulitis, anasarca, AKI, Serologic work up there complements normal, negative SPEP/UPEP, ANA negative, ANCA negative, GBM negative, negative hepatitis). Was declared new ESRD prior to transfer w/eventual plan for outpt HD TTS at  [place] [facility].  New issue during rehab vaginal bleeding + discharge with finding of thickened endometrium and a mass  1. 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 300cc UOP yesterday 1. Vein mapping completed, consult [facility] towards end of stay 2. Will change to TTS at discharge - [place] [facility] ([facility]) 2. Hypotension - On Midodrine for continued pressure support. BP stable.  1. Continue Midodrine 10mg  tid 3. Anemia, Iron deficiency - S/p 1 dose of aranesp 60 ono 8/8,  and 2 doses of Ferrlecit 250 each 8/5  1. Hb 9.2 Friday 2. Monitor 4. Hyperphosphatemia - on 8/9 phos was <1.0, 8/10 was 1.8 with hopes that liberalized diet would improve however 1.1 8/11.  1. 3.2 on repeat today! (IV phos  yesterday) 2. Liberalized diet/binders stopped 3. Continue to follow 5. A fib - on diltiazem, amiodarone; eliquis 6. Vaginal bleeding & discharge - pelvic US with thickened endometrial lining at 22 mm and a 3.7cm hyperechoic mass was noted. Primary dw GYN who recs outpatient biopsy. 1. Getting Megace 2. Thick yellow/green discharge + continued bloody discharge 3. Wet prep ordered however yet to be completed, please collect sample  4. Have also ordered gram stain, gon/chlamydia as well  Shannon Molt, DO Internal Medicine   I have seen and examined this patient and agree with plan and assessment in the above note with renal recommendations/intervention highlighted. In the above note by Dr. Danford Bad. Pt will continue MWF HD. Eating better with liberalized diet. Phos improved after IV relacemetn again yesterday. Numbers have looked so low was almost optimistic about some renal fx recovery, but tells me today, no UOP since last night and minimal recorded  So maybe just function of low muscle mass, 4 hour HD TMT.s and low phos 2/2 poor po which is picking up.    Shannon Beranek B,MD 05/20/2017 12:24 PM

## 2017-05-20 NOTE — Progress Notes (Signed)
Physical Therapy Session Note  Patient Details  Name: [name] MRN: [id number] Date of Birth: Dec 31, 1950  Today's Date: 05/20/2017 PT Individual Time: 1617-1700 PT Individual Time Calculation (min): 43 min   Short Term Goals: Week 2:  PT Short Term Goal 1 (Week 2): pt will perform sit to stand with max A PT Short Term Goal 2 (Week 2): pt will consistently perform transfers with +1 max A  Skilled Therapeutic Interventions/Progress Updates:    Pt seated in w/c upon PT arrival, agreeable to therapy tx and denies pain. Pt transferred from w/c to gym with max assist, squat pivot transfer. Worked on dynamic seated balance to throw horse shoes and match playing cards using R UE and keeping L UE in lap, PT providing manual facilitation to block R shoulder hike during functional activities. R shoulder flexion AAROM 1 x 10 performed to work on strengthening and ROM. Worked on lateral scooting mass practice x 8 in each direction for carryover for transfers, max assist needed for weightshift. Pt performed sit<>stand with max assist from elevated surface using RW for UE support, pt with increased anxiety during this, provided reassurance. Pt performed squat pivot transfer back to w/c with max assist. Pt left seated in w/c with needs in reach.   Therapy Documentation Precautions:  Precautions Precautions: Fall Restrictions Weight Bearing Restrictions: No   See Function Navigator for Current Functional Status.   Therapy/Group: Individual Therapy  Netta Corrigan, PT, DPT 05/20/2017, 5:22 PM

## 2017-05-20 NOTE — Progress Notes (Signed)
RN has attempted to collect the proper tubing for the Wet prep genital lab work to send to lab since 8-11 AM. RN collected a sample with the wrong tubing per lab. RN has gone back and forth for 2 days regarding proper tubing for collection. Lab and materials are unable to provide proper tubing. RN discussed with materials about issue. Clinical biochemist will arrive to hospital in the morning to assist. RN will pass info on regarding the need for the wet prep testing to be completed by RN. Gram stain and gon/chlamydia test complete with proper tubing/lab work.   Pt has has minimal to no vaginal bleeding today and scant discharge.

## 2017-05-21 ENCOUNTER — Encounter (HOSPITAL_COMMUNITY): Payer: Medicare Other | Admitting: Psychology

## 2017-05-21 ENCOUNTER — Inpatient Hospital Stay (HOSPITAL_COMMUNITY): Payer: Medicare Other | Admitting: Occupational Therapy

## 2017-05-21 ENCOUNTER — Inpatient Hospital Stay (HOSPITAL_COMMUNITY): Payer: Medicare Other | Admitting: Physical Therapy

## 2017-05-21 ENCOUNTER — Encounter (HOSPITAL_COMMUNITY): Payer: Self-pay | Admitting: Physical Medicine & Rehabilitation

## 2017-05-21 HISTORY — PX: PR DEBRIDEMENT, SKIN, SUB-Q TISSUE,=<20 SQ CM: 11042

## 2017-05-21 HISTORY — PX: PR DEBRIDEMENT, SKIN, SUB-Q TISSUE,EACH ADD 20 SQ CM: 11045

## 2017-05-21 LAB — CBC WITH DIFFERENTIAL/PLATELET
BASOS ABS: 0 10*3/uL (ref 0.0–0.1)
BASOS PCT: 0 %
EOS PCT: 5 %
Eosinophils Absolute: 0.5 10*3/uL (ref 0.0–0.7)
HEMATOCRIT: 28.5 % — AB (ref 36.0–46.0)
Hemoglobin: 9.2 g/dL — ABNORMAL LOW (ref 12.0–15.0)
Lymphocytes Relative: 13 %
Lymphs Abs: 1.3 10*3/uL (ref 0.7–4.0)
MCH: 33.1 pg (ref 26.0–34.0)
MCHC: 32.3 g/dL (ref 30.0–36.0)
MCV: 102.5 fL — ABNORMAL HIGH (ref 78.0–100.0)
MONO ABS: 0.8 10*3/uL (ref 0.1–1.0)
Monocytes Relative: 7 %
Neutro Abs: 8 10*3/uL — ABNORMAL HIGH (ref 1.7–7.7)
Neutrophils Relative %: 75 %
Platelets: 187 10*3/uL (ref 150–400)
RBC: 2.78 MIL/uL — ABNORMAL LOW (ref 3.87–5.11)
RDW: 23.9 % — AB (ref 11.5–15.5)
WBC: 10.7 10*3/uL — ABNORMAL HIGH (ref 4.0–10.5)

## 2017-05-21 LAB — GRAM STAIN

## 2017-05-21 LAB — RENAL FUNCTION PANEL
Albumin: 3.2 g/dL — ABNORMAL LOW (ref 3.5–5.0)
Anion gap: 12 (ref 5–15)
BUN: 61 mg/dL — AB (ref 6–20)
CALCIUM: 8.8 mg/dL — AB (ref 8.9–10.3)
CHLORIDE: 103 mmol/L (ref 101–111)
CO2: 24 mmol/L (ref 22–32)
CREATININE: 3.55 mg/dL — AB (ref 0.44–1.00)
GFR calc Af Amer: 14 mL/min — ABNORMAL LOW (ref >60)
GFR calc non Af Amer: 12 mL/min — ABNORMAL LOW (ref >60)
GLUCOSE: 151 mg/dL — AB (ref 65–99)
Phosphorus: 2.6 mg/dL (ref 2.5–4.6)
Potassium: 4.3 mmol/L (ref 3.5–5.1)
SODIUM: 139 mmol/L (ref 135–145)

## 2017-05-21 LAB — GLUCOSE, CAPILLARY
GLUCOSE-CAPILLARY: 106 mg/dL — AB (ref 65–99)
GLUCOSE-CAPILLARY: 89 mg/dL (ref 65–99)
Glucose-Capillary: 86 mg/dL (ref 65–99)
Glucose-Capillary: 76 mg/dL (ref 65–99)

## 2017-05-21 LAB — WET PREP, GENITAL
CLUE CELLS WET PREP: NONE SEEN
SPERM: NONE SEEN
TRICH WET PREP: NONE SEEN
Yeast Wet Prep HPF POC: NONE SEEN

## 2017-05-21 MED ORDER — LIDOCAINE HCL 4 % EX SOLN
Freq: Once | CUTANEOUS | Status: AC
  Administered 2017-05-21: 50 mL via TOPICAL
  Filled 2017-05-21: qty 50

## 2017-05-21 MED ORDER — PHENYLEPHRINE HCL 10 % OP SOLN: Freq: Once | OPHTHALMIC | Status: DC

## 2017-05-21 NOTE — Progress Notes (Signed)
[facility] Rounding Note  SUBJECTIVE    Interval events:   Feels well.  Eating much better since diet liberalized.  States she's had no urine output in the past 24 hrs   Intake/Output Summary (Last 24 hours) at 05/21/17 1011 Last data filed at 05/21/17 0730  Gross per 24 hour  Intake              720 ml  Output                0 ml  Net              720 ml   Filed Weights   05/19/17 0446 05/20/17 0548 05/21/17 0435  Weight: 153 lb 10.6 oz (69.7 kg) 154 lb 5.2 oz (70 kg) 156 lb 15.5 oz (71.2 kg)   OBJECTIVE Physical Exam   VITALS: Blood pressure 110/75, pulse (!) 109, temperature 97.8 F (36.6 C), temperature source Oral, resp. rate 16, height 5\' 5"  (1.651 m), weight 151 lb 3.2 oz (68.6 kg), SpO2 99 %. GEN: Alert and oriented, pleasant and conversant. Right IJ permacath (placed 04/26/17) CV: IRIR, Diastolic murmur PULM: CTAB, able to speak in complete sentences. Normal WOB on RA.  ABD: Soft, nontender. Not distended. SKIN: No rash. LLE with clean dry bandages.  EXT: Minimal LE edema.  Basic Metabolic Panel:  Recent Labs  05/19/17 0553 05/20/17 0608  NA 137 136  K 4.2 4.1  CL 101 100*  CO2 28 28  GLUCOSE 77 87  BUN 9 34*  CREATININE 1.11* 2.42*  CALCIUM 8.2* 8.4*  PHOS 1.1* 3.2    Recent Labs  05/19/17 0553 05/20/17 0608  ALBUMIN 3.0* 2.9*    Recent Labs  05/21/17 0917  WBC 10.7*  NEUTROABS 8.0*  HGB 9.2*  HCT 28.5*  MCV 102.5*  PLT 187   Current IP Medications . amiodarone  200 mg Oral BID  . apixaban  2.5 mg Oral BID  . collagenase   Topical Daily  . darbepoetin (ARANESP) injection - DIALYSIS  60 mcg Intravenous Q Wed-HD  . diltiazem  120 mg Oral Daily  . famotidine  20 mg Oral QHS  . feeding supplement (NEPRO CARB STEADY)  237 mL Oral Q1500  . feeding supplement (PRO-STAT SUGAR FREE 64)  30 mL Oral BID  . hydrocerin   Topical BID  . levothyroxine  100 mcg Oral QAC breakfast  . megestrol  40 mg Oral BID  . midodrine  10 mg Oral TID WC  .  multivitamin  1 tablet Oral QHS  . QUEtiapine  25 mg Oral QHS  . senna-docusate  2 tablet Oral QHS  . sertraline  25 mg Oral QHS   Continuous Infusion Medications: None  PRN Medications:  acetaminophen, aluminum hydroxide, artificial tears, bisacodyl, diphenhydrAMINE, guaiFENesin-dextromethorphan, ipratropium-albuterol, ondansetron (ZOFRAN) IV, phenol, polyethylene glycol, prochlorperazine **OR** prochlorperazine **OR** prochlorperazine, zinc oxide  Assessment & Plan:   66yo female admitted to [facility] 04/08/17 with septic/cardiogenic shock with multiorgan failure including anuric AKI for which she was started on CRRT and transitioned to intermittent HD (MWF). She remains HD dependent producing small volumes of urine. She is now in physical rehabilitation (transferred here 05/08/17) (On record review creatinine 09/2014 0.62, 1.94 on presentation to [facility] 04/08/17 with LE cellulitis, anasarca, AKI, Serologic work up there complements normal, negative SPEP/UPEP, ANA negative, ANCA negative, GBM negative, negative hepatitis). Was declared new ESRD prior to transfer w/eventual plan for outpt HD TTS at [facility] unit.  New issue during rehab vaginal  bleeding + discharge with finding of thickened endometrium and a mass  1. 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 Vein mapping completed, consult VVS towards end of stay 2. Will change to TTS at discharge - [place] [facility] ([place]) Will continue with MWF inpatient.  Next session today. 2. Hypotension - On Midodrine for continued pressure support. BP stable.  1. Continue Midodrine 10mg  tid 3. Anemia, Iron deficiency - S/p 1 dose of aranesp 60 ono 8/8,  and 2 doses of Ferrlecit 250 each 8/5  1. Hb 9.2 and stable 2. Monitor 4. Hyperphosphatemia - on 8/9 phos was <1.0, 8/10 was 1.8 with hopes that liberalized diet would improve however 1.1 8/11.  1. 3.2 phosphorus yesterday after IV sodium phos 2. Liberalized  diet/binders stopped 3. Continue to follow 5. A fib - on diltiazem, amiodarone; eliquis 6. Vaginal bleeding & discharge - pelvic US with thickened endometrial lining at 22 mm and a 3.7cm hyperechoic mass was noted. Primary dw GYN who recs outpatient biopsy. 1. Getting Megace 2. Thick yellow/green discharge + continued bloody discharge - states has not seen any today 3. Wet prep ordered and pending 4.  gram stain, gon/chlamydia pending  Kalman Shan PGY-2 Internal Medicine     I have seen and examined this patient and agree with plan and assessment in the above note with renal recommendations/intervention highlighted.   Due for dialysis later today.  When closer to discharge will get her on TTS schedule for [place] [facility].  Governor Rooks Elder Davidian,MD 05/21/2017 12:28 PM

## 2017-05-21 NOTE — Consult Note (Signed)
Neuropsychological consultation  Patient:  [name]   DOB: [birth year]  MR Number: [id number]  Location: [facility]:  [address] [place],  [place], [address]  Start: 10 AM End: 11 AM  Provider/Observer:     Ilean Skill, Psy.D.   Reason For Service:     [name] is a 66 year old female admitted 04/08/2017 due to BLE cellulitis with difficulty walking.  She was found to be septic and hypotensive.  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   Interventions Strategy:  Cognitive/behavioral therapeutic interventions and working on coping skills and strategies around anxiety and depressive type symptoms.  Participation Level:   Active  Participation Quality:  Appropriate      Behavioral Observation:  Well Groomed, Alert, and Appropriate.   Current Psychosocial Factors: The patient reports that she has been worried about what it will be like when she is discharged from the rehabilitation unit. She reports that she is feeling rather anxious and fearful about what will happen when she goes home. The patient also acknowledges that there were times where she was "having a bad day" and became upset during therapeutic interventions. She reports that this is not been a steady state and that her depressive type symptoms are not constant and her mood is quite good now. The patient is worried about her husband being able to take care of her and being a burden on him.  Content of Session:   Reviewed current symptoms and work on therapeutic interventions around adjusting to both her current hospitalization along with her medical issues that are being resolved particularly with regard to scar tissue. The patient reports that her strength is improving. The patient also reports that she has had times where anxiety and frustration.  Current Status:   The patient reports anxiety more about thinking about and  worrying about discharge issues. The patient reports that she has had some days with a lot of frustration.  Impression/Diagnosis:   [name] is a 66 year old female admitted 04/08/2017 due to BLE cellulitis with difficulty walking.  She was found to be septic and hypotensive.  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  The patient reports that she is coping fairly well given the medical situation.  She agreed to keep staff informed if depressive or anxiety type symptoms develop or worsen.  The patient acknowledges that she has had times with anxiety and frustration along with depressive events. Her anxiety is more about what it will be like when she is discharged and will she be ready. The patient reports that her mood is much better today and she is not feeling overly depressed. She was instructed to keep [place] informed if she does develop more a recurrent depressive type symptoms. We worked specifically today around issues of anxiety with regard to her medical treatment and upcoming discharge. Her discharge date has not been determined at this point.  Diagnosis:

## 2017-05-21 NOTE — Progress Notes (Signed)
Physical Therapy Note  Patient Details  Name: [name] MRN: [id number] Date of Birth: [birth year] Today's Date: 05/21/2017    Time: 1130-1200 30 minutes  1:1 No c/o pain.  Pt seated edge of mat performed anterior wt shifts with chair in front to encourage anterior lean and clear bottom. Pt unable to clear bottom with leaning but decreased anxiety with forward lean.  Continues to require UE assist to sit upright from forward lean due to weak back and hip mm.  Push ups with push up blocks seated edge of mat with pt unable to clear bottom but improving LE engagement with tactile cues.  Pt mod A sliding board transfer to w/c, noted bleeding through bandages on Lt foot, RN made aware.   Time 2: 1300-1408 68 minutes  1:1 No c/o pain.  Sit to stand training from elevated surface multiple attempts with max A to stand,+2 to hold RW.  Pt able to stand up to 3 minutes at a time with 1 UE support while performing peg board activity.  Pt requires cues for following directions and for recall of instructions during peg board activity.  Attempt stand pivot transfer, pt unable due to LE buckling when attempting 1 UE stance.  peformed wt shifts in standing with RW with mod A.  UBE 3 minutes forward, 2 x 1 minute backward for UE strengthening.  Pt performed mod A sliding board transfer to bed for dialysis.  Pt with continued bleeding through bandages on Lt foot, RN aware.   Matylda Fehring 05/21/2017, 2:07 PM

## 2017-05-21 NOTE — Progress Notes (Signed)
Patient states she slept well overnight.  No vaginal bleeding or discharge present this morning.  Denies discomfort.    [name] M

## 2017-05-21 NOTE — Procedures (Signed)
After placing Topical Lidocaine in each of the wound bases for pain control wound, wounds 1,2,3,4  underwent sharp excisional debridement using #3 curette.  Removing devitalized, necrotic tissue going into/through subcutaneous and fat tissues until a some healthy tissue was exposed.  Bleeding was mild. Bleeding was controlled with pressure.  Complications were absent.   Debridement is medically necessary to promote wound healing.  Pre-Debridement Measurements   1. Foot  #1. 12.0 x 5.0 x 0.1  2. Tibia  #2. 1 x 0.5 x 0.1    #3. 2.0 x 1.5 x 0.1     #4. 8.6 x 6.3 x 0.1  Post-Debridement Measurements   #1. 12.3 x 5.3 x 0.2  #2. 1.1 x 0.6 x 0.4  #3. 2.2 x 1.6 x 0.4  #4. 8.8 x 3.2 x 0.3  Total Centimeters Squared Debrided = 23.11.

## 2017-05-21 NOTE — Progress Notes (Signed)
Occupational Therapy Session Note  Patient Details  Name: Paytin Ramakrishnan MRN: [id number] Date of Birth: 1951-08-14  Today's Date: 05/21/2017 OT Individual Time: 8882-8003 and 1103-1130 OT Individual Time Calculation (min): 57 min and 27 min   Short Term Goals: Week 2:  OT Short Term Goal 1 (Week 2): Pt will complete sit > stand in [place] with one person assist to decrease burden of care OT Short Term Goal 2 (Week 2): Pt will don pants with max assist at bed level utilizing AE PRN and circle sitting technique OT Short Term Goal 3 (Week 2): Pt will don shirt with supervision in unsupported sitting on EOB OT Short Term Goal 4 (Week 2): Pt will complete slide board transfer with mod assist of one caregiver  Skilled Therapeutic Interventions/Progress Updates:    1) Treatment session with focus on LB bathing and dressing at bed level and BUE strengthening.  Pt received in bed stating MD to return after therapy session to complete debridement of LLE therefore requesting to remain in bed.  Engaged in LB bathing and dressing with focus on modified circle sitting and figure 4 position to increase independence with washing lower legs and threading pants.  Pt continues to require use of bed functions and assist to maintain upright sitting balance to increase reach towards legs.  Assist to position LLE and begin threading of Lt pant leg.  Engaged in [place] bathing and dressing seated at EOB with improved sitting balance and ability to pull shirt over trunk this session.  Engaged in 3 sets of 10 bicep curls and chest presses with 1# and 2# dowel rods.  Pt with limited shoulder flexion with difficulty lifting elbows from bed level.  Educated on functional carryover of exercises with pt reporting understanding.    2) Treatment session with focus on transfers with use of slide board and increased B shoulder flexion.  Pt received in bed, completed bed mobility and transfer to w/c with use of slide board with mod assist.  Pt  continues to require assistance for anterior weight shift and lifting buttocks during transitional movements.  Engaged in reaching activity in unsupported sitting with focus on increased shoulder flexion, pt compensating by sliding UE across table or up surface to complete task although still not achieving > 30-40* flexion on Rt or Lt.  Pt utilizes abduction with LUE to increase ROM.  Pt passed off to PT.  Therapy Documentation Precautions:  Precautions Precautions: Fall Restrictions Weight Bearing Restrictions: No General:   Vital Signs: Therapy Vitals Temp: 98.6 F (37 C) Temp Source: Oral Pulse Rate: (!) 117 Resp: 18 BP: 110/81 Patient Position (if appropriate): Lying Oxygen Therapy SpO2: 100 % O2 Device: Not Delivered Pain:  Pt with no c/o pain  See Function Navigator for Current Functional Status.   Therapy/Group: Individual Therapy  [name] 05/21/2017, 8:34 AM

## 2017-05-21 NOTE — Progress Notes (Signed)
[facility]ICINE [facility] RE[facility]ABILITATION     PROGRESS NOTE  Subjective/Complaints:  Pt seen laying in bed this AM. She slept well overnight.  She is ready for debridement.  ROS: Denies nausea, vomiting, diarrhea, shortness of breath or chest pain   Objective: Vital Signs: Blood pressure 110/81, pulse (!) 117, temperature 98.6 F (37 C), temperature source Oral, resp. rate 18, height 5\' 5"  (1.651 m), weight 71.2 kg (156 lb 15.5 oz), SpO2 100 %. No results found. No results for input(s): WBC, HGB, HCT, PLT in the last 72 hours.  Recent Labs  05/19/17 0553 05/20/17 0608  NA 137 136  K 4.2 4.1  CL 101 100*  GLUCOSE 77 87  BUN 9 34*  CREATININE 1.11* 2.42*  CALCIUM 8.2* 8.4*   CBG (last 3)   Recent Labs  05/20/17 1207 05/20/17 2049 05/21/17 0637  GLUCAP 90 119* 86    Wt Readings from Last 3 Encounters:  05/21/17 71.2 kg (156 lb 15.5 oz)  05/07/17 75.3 kg (166 lb 0.1 oz)    Physical Exam:  BP 110/81 (BP Location: Right Arm)   Pulse (!) 117   Temp 98.6 F (37 C) (Oral)   Resp 18   Ht 5\' 5"  (1.651 m)   Wt 71.2 kg (156 lb 15.5 oz)   SpO2 100%   BMI 26.12 kg/m  Constitutional: She appears well-developed and well-nourished.  HENT: Normocephalic. Atraumatic. Eyes: EOMI. No discharge.  Cardiovascular: iRIR Respiratory: CTA Bilaterally. Normal effort.  GI: Soft. Bowel sounds are normal.   Musculoskeletal: She exhibits edema and tenderness.  Neurological: She is alert and oriented.  Able to follow commands without difficulty.  Motor: B/l UE 4/5 proximal to distal B/l LE: HF 3-/5, KE 3/5, ADF/PF 3+/5 (unchanged) Skin: LLE with ulcerations with eschar on dorsum of foot and distal tibia.  Psychiatric: She has a normal mood and affect. Her behavior is normal. Thought content normal.    Assessment/Plan: 1. 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 Physiatrist is  providing close team supervision and 24 hour management of active medical problems listed below. Physiatrist and rehab team continue to assess barriers to discharge/monitor patient progress toward functional and medical goals.  Function:  Bathing Bathing position   Position: Bed  Bathing parts Body parts bathed by patient: Right arm, Left arm, Chest, Abdomen, Right upper leg, Left upper leg, Right lower leg Body parts bathed by helper: Back  Bathing assist Assist Level: Touching or steadying assistance(Pt > 75%) (for sitting balance)      Upper Body Dressing/Undressing Upper body dressing   What is the patient wearing?: Pull over shirt/dress     Pull over shirt/dress - Perfomed by patient: Put head through opening, Thread/unthread left sleeve, Pull shirt over trunk, Thread/unthread right sleeve Pull over shirt/dress - Perfomed by helper: Put head through opening, Pull shirt over trunk        Upper body assist Assist Level: Supervision or verbal cues, Set up   Set up : To obtain clothing/put away  Lower Body Dressing/Undressing Lower body dressing   What is the patient wearing?: Pants     Pants- Performed by patient: Thread/unthread right pants leg Pants- Performed by helper: Pull pants up/down, Thread/unthread left pants leg Non-skid slipper socks- Performed by patient: Don/doff right sock Non-skid slipper socks- Performed by helper: Don/doff right sock  Lower body assist Assist for lower body dressing:  (Mod assist)      Toileting Toileting Toileting activity did not occur: Refused   Toileting steps completed by helper: Adjust clothing prior to toileting, Performs perineal hygiene, Adjust clothing after toileting    Toileting assist Assist level: Two helpers   Transfers Chair/bed transfer   Chair/bed transfer method: Lateral scoot Chair/bed transfer assist level: Maximal assist (Pt 25 - 49%/lift and lower) Chair/bed transfer assistive device:  Armrests Mechanical lift: Stedy   Locomotion Ambulation Ambulation activity did not occur: Safety/medical concerns         Wheelchair   Type: Manual Max wheelchair distance: 10' Assist Level: Touching or steadying assistance (Pt > 75%)  Cognition Comprehension Comprehension assist level: Follows basic conversation/direction with no assist  Expression Expression assist level: Expresses basic needs/ideas: With no assist  Social Interaction Social Interaction assist level: Interacts appropriately with others - No medications needed.  Problem Solving Problem solving assist level: Solves basic 90% of the time/requires cueing < 10% of the time  Memory Memory assist level: Recognizes or recalls 90% of the time/requires cueing < 10% of the time    Medical Problem List and Plan: 1.  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  DVT Prophylaxis/Anticoagulation: Pharmaceutical: Other (comment)--Eliquis, may need to d/c if bleeding persists 3. Pain Management: tylenol prn 4. Mood: LCSW to follow for evaluation and support.  5. Neuropsych: This patient is capable of making decisions on her own behalf. 6. Skin/Wound Care: routine pressure relief measures. Prevalon boot for heel ulcer.  7. Fluids/Electrolytes/Nutrition: Strict I/Os.   8.  Hypotension: Monitor orthostatic BP. Added abdominal binder and TEDs to help with BP support.    Vitals:   05/20/17 1445 05/21/17 0435  BP: 96/66 110/81  Pulse: (!) 104 (!) 117  Resp: 16 18  Temp: 98.6 F (37 C) 98.6 F (37 C)  SpO2: 100% 100%   Controlled 8/13 9. Paroxysmal Afib:  on eliquis for stroke prevention. Continue amiodarone bid and Cardizem daily.  10. Leukocytosis:              Patient with ulcers--continue with WOC recs/daily dressing             Afebrile             WBCs 10.7 on 8/13             Cont to monitor 11. Anemia of chronic disease: On epogen  MWF.              Hb 9.2 on 8/13              Cont to montior 12. Left foot/leg ulcer: Daily dressing changes with santyl. Monitor for signs of infection.   Debrided 8/6, again 8/13  Will consult PRS for debridement in OR 13. Anxiety/depression: On Seroquel and Zoloft at nights.  14. Prediabetes: CBG (last 3)   Recent Labs  05/20/17 1207 05/20/17 2049 05/21/17 0637  GLUCAP 90 119* 86   Relatively controlled 8/12 15. CKD now HD dependent: HD on MWF at end of day to help with tolerance of therapy. Renal diet--Reducate patient on appropriate diet.  16. Vaginal bleeding  Per PCP no issues prior  Gyn consulted, Megace initiated, transvaginal U/S showing endometrial thickening and mass.  Contacted Gyn, per recs, outpt workup to rule out CA. 17. Abnormal UA  UA +, Ucx with multiple species  Cont Keflex x7  days started 8/8 18. AKI  Cr 1.39 on 8/9  Encourage fluids  Cont to monitor 19. Vaginal discharge  Specimen sent for analysis awaiting results  LOS (Days) 14 A FACE TO FACE EVALUATION WAS PERFORMED  Asiah Browder Lorie Phenix 05/21/2017 10:04 AM

## 2017-05-22 ENCOUNTER — Inpatient Hospital Stay (HOSPITAL_COMMUNITY): Payer: Medicare Other | Admitting: Occupational Therapy

## 2017-05-22 ENCOUNTER — Inpatient Hospital Stay (HOSPITAL_COMMUNITY): Payer: Medicare Other

## 2017-05-22 ENCOUNTER — Inpatient Hospital Stay (HOSPITAL_COMMUNITY): Payer: Medicare Other | Admitting: Physical Therapy

## 2017-05-22 DIAGNOSIS — N898 Other specified noninflammatory disorders of vagina: Secondary | ICD-10-CM

## 2017-05-22 DIAGNOSIS — F330 Major depressive disorder, recurrent, mild: Secondary | ICD-10-CM

## 2017-05-22 LAB — GLUCOSE, CAPILLARY
Glucose-Capillary: 95 mg/dL (ref 65–99)
Glucose-Capillary: 103 mg/dL — ABNORMAL HIGH (ref 65–99)
Glucose-Capillary: 102 mg/dL — ABNORMAL HIGH (ref 65–99)
Glucose-Capillary: 109 mg/dL — ABNORMAL HIGH (ref 65–99)

## 2017-05-22 LAB — GC/CHLAMYDIA PROBE AMP (~~LOC~~) NOT AT ARMC
Chlamydia: NEGATIVE
NEISSERIA GONORRHEA: NEGATIVE

## 2017-05-22 NOTE — Progress Notes (Signed)
[facility] Rounding Note  SUBJECTIVE    Interval events:   Feels well. Had HD without issues yesterday. Eating much better since diet liberalized.  States she's urinating    Intake/Output Summary (Last 24 hours) at 05/22/17 0914 Last data filed at 05/21/17 2000  Gross per 24 hour  Intake              360 ml  Output             1000 ml  Net             -640 ml   Filed Weights   05/21/17 0435 05/21/17 1419 05/21/17 1751  Weight: 156 lb 15.5 oz (71.2 kg) 158 lb 11.7 oz (72 kg) 156 lb 8.4 oz (71 kg)   OBJECTIVE Physical Exam   VITALS: Blood pressure 110/75, pulse (!) 109, temperature 97.8 F (36.6 C), temperature source Oral, resp. rate 16, height 5\' 5"  (1.651 m), weight 151 lb 3.2 oz (68.6 kg), SpO2 99 %. GEN: Alert and oriented, pleasant and conversant. Right IJ permacath (placed 04/26/17) CV: IRIR, Diastolic murmur PULM: CTAB, able to speak in complete sentences. Normal WOB on RA.  ABD: Soft, nontender. Not distended. SKIN: No rash. LLE with clean dry bandages.  EXT: Minimal LE edema.  Basic Metabolic Panel:  Recent Labs  05/20/17 0608 05/21/17 1400  NA 136 139  K 4.1 4.3  CL 100* 103  CO2 28 24  GLUCOSE 87 151*  BUN 34* 61*  CREATININE 2.42* 3.55*  CALCIUM 8.4* 8.8*  PHOS 3.2 2.6    Recent Labs  05/20/17 0608 05/21/17 1400  ALBUMIN 2.9* 3.2*    Recent Labs  05/21/17 0917  WBC 10.7*  NEUTROABS 8.0*  HGB 9.2*  HCT 28.5*  MCV 102.5*  PLT 187   Current IP Medications . amiodarone  200 mg Oral BID  . apixaban  2.5 mg Oral BID  . collagenase   Topical Daily  . darbepoetin (ARANESP) injection - DIALYSIS  60 mcg Intravenous Q Wed-HD  . diltiazem  120 mg Oral Daily  . famotidine  20 mg Oral QHS  . feeding supplement (NEPRO CARB STEADY)  237 mL Oral Q1500  . feeding supplement (PRO-STAT SUGAR FREE 64)  30 mL Oral BID  . hydrocerin   Topical BID  . levothyroxine  100 mcg Oral QAC breakfast  . megestrol  40 mg Oral BID  . midodrine  10 mg Oral TID WC  .  multivitamin  1 tablet Oral QHS  . QUEtiapine  25 mg Oral QHS  . senna-docusate  2 tablet Oral QHS  . sertraline  25 mg Oral QHS   Continuous Infusion Medications: None  PRN Medications:  acetaminophen, aluminum hydroxide, artificial tears, bisacodyl, diphenhydrAMINE, guaiFENesin-dextromethorphan, ipratropium-albuterol, ondansetron (ZOFRAN) IV, phenol, polyethylene glycol, prochlorperazine **OR** prochlorperazine **OR** prochlorperazine, zinc oxide  Assessment & Plan:   66yo female admitted to [facility] 04/08/17 with septic/cardiogenic shock with multiorgan failure including anuric AKI for which she was started on CRRT and transitioned to intermittent HD (MWF). She remains HD dependent producing small volumes of urine. She is now in physical rehabilitation (transferred here 05/08/17) (On record review creatinine 09/2014 0.62, 1.94 on presentation to [facility] 04/08/17 with LE cellulitis, anasarca, AKI, Serologic work up there complements normal, negative SPEP/UPEP, ANA negative, ANCA negative, GBM negative, negative hepatitis). Was declared new ESRD prior to transfer w/eventual plan for outpt HD TTS at [facility] unit.  New issue during rehab vaginal bleeding + discharge with finding of thickened  endometrium and a mass  1. 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 Vein mapping completed, consult VVS towards end of stay 2. Will change to TTS at discharge - [place] [facility] ([place]) Will continue with MWF inpatient.   2. Hypotension - On Midodrine for continued pressure support. BP stable.  1. Continue Midodrine 10mg  tid 3. Anemia, Iron deficiency - S/p 1 dose of aranesp 60 ono 8/8,  and 2 doses of Ferrlecit 250 each 8/5  1. Hb 9.2 and stable 2. Monitor 4. Hyperphosphatemia - on 8/9 phos was <1.0, 8/10 was 1.8 with hopes that liberalized diet would improve however 1.1 8/11. Received na phos 8/11 1. 2.6 phosphorus  2. Liberalized diet/binders stopped 3. Continue  to follow 5. A fib - on diltiazem, amiodarone; eliquis 6. Vaginal bleeding & discharge - pelvic US with thickened endometrial lining at 22 mm and a 3.7cm hyperechoic mass was noted. Primary dw GYN who recs outpatient biopsy. 1. Getting Megace 2. Wet prep noted WBC 3.  gram stain, gon/chlamydia pending  Kalman Shan PGY-2 Internal Medicine      I have seen and examined this patient and agree with plan and assessment in the above note with renal recommendations/intervention highlighted.  Continue with HD qMWF for now and when closer to discharge will transition to TTS schedule (outpatient schedule at [facility] HD unit) Broadus John A Oviya Ammar,MD 05/22/2017 1:52 PM

## 2017-05-22 NOTE — Progress Notes (Signed)
Contacted Dr. Migdalia Dk for consult regarding patient's foot wound.

## 2017-05-22 NOTE — Progress Notes (Signed)
Physical Therapy Note  Patient Details  Name: [name] MRN: [id number] Date of Birth: 16-Sep-1951 Today's Date: 05/22/2017    Time: [phone] 55 minutes  1:1 No c/o pain.  Sit to stand multiple reps with mod/max A from elevated surface. Standing with wt shifts, heel and toe raises with min/mod A with RW.  Stand pivot transfer training x 3 reps with mod A to stand from elevated surface, max/total A to stand from w/c level.  Pt performed stand pivot transfer with min/mod A.  Gait training 6' x 2, 8' with RW with max A to stand, min A to gait. Pt fatigues quickly but is encouraged by improvement and ability to gait today.   Saydi Kobel 05/22/2017, 10:27 AM

## 2017-05-22 NOTE — Progress Notes (Signed)
[facility] PHYSICAL MEDICINE & REHABILITATION     PROGRESS NOTE  Subjective/Complaints:  Pt seen laying in bed this AM.  She slept well overnight, but had a late HD session.   ROS: Denies nausea, vomiting, diarrhea, shortness of breath or chest pain   Objective: Vital Signs: Blood pressure 97/74, pulse (!) 116, temperature 98.2 F (36.8 C), temperature source Oral, resp. rate 16, height 5\' 5"  (1.651 m), weight 71 kg (156 lb 8.4 oz), SpO2 100 %. No results found.  Recent Labs  05/21/17 0917  WBC 10.7*  HGB 9.2*  HCT 28.5*  PLT 187    Recent Labs  05/20/17 0608 05/21/17 1400  NA 136 139  K 4.1 4.3  CL 100* 103  GLUCOSE 87 151*  BUN 34* 61*  CREATININE 2.42* 3.55*  CALCIUM 8.4* 8.8*   CBG (last 3)   Recent Labs  05/21/17 1826 05/21/17 2004 05/22/17 0708  GLUCAP 76 89 95    Wt Readings from Last 3 Encounters:  05/21/17 71 kg (156 lb 8.4 oz)  05/07/17 75.3 kg (166 lb 0.1 oz)    Physical Exam:  BP 97/74 (BP Location: Right Arm)   Pulse (!) 116   Temp 98.2 F (36.8 C) (Oral)   Resp 16   Ht 5\' 5"  (1.651 m)   Wt 71 kg (156 lb 8.4 oz)   SpO2 100%   BMI 26.05 kg/m  Constitutional: She appears well-developed and well-nourished.  HENT: Normocephalic. Atraumatic. Eyes: EOMI. No discharge.  Cardiovascular: IRIR Respiratory: CTA Bilaterally. Normal effort.  GI: Soft. Bowel sounds are normal.   Musculoskeletal: She exhibits edema and tenderness.  Neurological: She is alert and oriented.  Able to follow commands without difficulty.  Motor: B/l UE 4/5 proximal to distal RLE: HF 3/5, KE 3+/5, ADF/PF 4/5  LLE: HF 3-/5, KE 3/5, ADF/PF 3+/5 (unchanged) Skin: LLE with ulcerations with eschar on dorsum of foot and distal tibia with dressing c/d/i.  Psychiatric: She has a normal mood and affect. Her behavior is normal. Thought content normal.    Assessment/Plan: 1. 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 Physiatrist is providing close team supervision and 24 hour management of active medical problems listed below. Physiatrist and rehab team continue to assess barriers to discharge/monitor patient progress toward functional and medical goals.  Function:  Bathing Bathing position   Position: Bed  Bathing parts Body parts bathed by patient: Right arm, Left arm, Chest, Abdomen, Right upper leg, Left upper leg, Right lower leg Body parts bathed by helper: Back  Bathing assist Assist Level: Touching or steadying assistance(Pt > 75%) (for sitting balance)      Upper Body Dressing/Undressing Upper body dressing   What is the patient wearing?: Pull over shirt/dress     Pull over shirt/dress - Perfomed by patient: Put head through opening, Thread/unthread left sleeve, Pull shirt over trunk, Thread/unthread right sleeve Pull over shirt/dress - Perfomed by helper: Put head through opening, Pull shirt over trunk        Upper body assist Assist Level: Supervision or verbal cues, Set up   Set up : To obtain clothing/put away  Lower Body Dressing/Undressing Lower body dressing   What is the patient wearing?: Pants     Pants- Performed by patient: Thread/unthread right pants leg Pants- Performed by helper: Pull pants up/down, Thread/unthread left pants leg Non-skid slipper socks- Performed by patient: Don/doff right sock Non-skid slipper socks-  Performed by helper: Don/doff right sock                  Lower body assist Assist for lower body dressing:  (Mod assist)      Toileting Toileting Toileting activity did not occur: N/A (anuric, no bm, no toileting performed)   Toileting steps completed by helper: Adjust clothing prior to toileting, Performs perineal hygiene, Adjust clothing after toileting    Toileting assist Assist level: Two helpers   Transfers Chair/bed transfer   Chair/bed transfer method: Lateral scoot Chair/bed transfer  assist level: Moderate assist (Pt 50 - 74%/lift or lower) Chair/bed transfer assistive device: Armrests Mechanical lift: Stedy   Locomotion Ambulation Ambulation activity did not occur: Safety/medical concerns         Wheelchair   Type: Manual Max wheelchair distance: 10' Assist Level: Touching or steadying assistance (Pt > 75%)  Cognition Comprehension Comprehension assist level: Follows basic conversation/direction with no assist  Expression Expression assist level: Expresses basic needs/ideas: With no assist  Social Interaction Social Interaction assist level: Interacts appropriately with others - No medications needed.  Problem Solving Problem solving assist level: Solves basic 90% of the time/requires cueing < 10% of the time  Memory Memory assist level: Recognizes or recalls 90% of the time/requires cueing < 10% of the time    Medical Problem List and Plan: 1.  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  DVT Prophylaxis/Anticoagulation: Pharmaceutical: Other (comment)--Eliquis, may need to d/c if persistent bleeding 3. Pain Management: tylenol prn 4. Mood: LCSW to follow for evaluation and support.  5. Neuropsych: This patient is capable of making decisions on her own behalf. 6. Skin/Wound Care: routine pressure relief measures. Prevalon boot for heel ulcer.  7. Fluids/Electrolytes/Nutrition: Strict I/Os.   8.  Hypotension: Monitor orthostatic BP. Added abdominal binder and TEDs to help with BP support.    Vitals:   05/21/17 1832 05/22/17 0535  BP: 118/82 97/74  Pulse: (!) 121 (!) 116  Resp: 18 16  Temp: 97.8 F (36.6 C) 98.2 F (36.8 C)  SpO2: (!) 10% 100%   Controlled 8/14 9. Paroxysmal Afib:  on eliquis for stroke prevention. Continue amiodarone bid and Cardizem daily.  10. Leukocytosis:              Patient with ulcers--continue with WOC recs/daily dressing             Afebrile             WBCs 10.7 on 8/13              Cont to monitor 11. Anemia of chronic disease: On epogen  MWF.              Hb 9.2 on 8/13             Cont to montior 12. Left foot/leg ulcer: Daily dressing changes with santyl. Monitor for signs of infection.   Debrided 8/6, again 8/13  Will follow up with PRS for debridement in OR 13. Anxiety/depression: On Seroquel and Zoloft at nights.  14. Prediabetes: CBG (last 3)   Recent Labs  05/21/17 1826 05/21/17 2004 05/22/17 0708  GLUCAP 76 89 95   Relatively controlled 8/14 15. CKD now HD dependent: HD on MWF at end of day to help with tolerance of therapy. Renal diet--Reducate patient on appropriate diet.  16. Vaginal bleeding  Per PCP no issues prior  Gyn consulted, Megace initiated,  transvaginal U/S showing endometrial thickening and mass.  Contacted Gyn, per recs, outpt workup to rule out CA. 17. Abnormal UA  UA +, Ucx with multiple species 18. Vaginal discharge  Wet prep with many WBCs  LOS (Days) 15 A FACE TO FACE EVALUATION WAS PERFORMED  Ankit Lorie Phenix 05/22/2017 9:24 AM

## 2017-05-22 NOTE — Progress Notes (Signed)
Physical Therapy Weekly Progress Note  Patient Details  Name: [name] MRN: [id number] Date of Birth: Oct 07, 1951  Beginning of progress report period: May 15, 2017 End of progress report period: May 22, 2017   Patient has met 2 of 2 short term goals.  Pt able to stand from elevated surfaces with max A and RW.  Pt consistently min/mod A for sliding board transfers. Pt still limited in w/c mobility due to UE weakness, unable to complete stand pivot transfers due to trunk and LE weakness.  Progress limited by medical complications  Patient continues to demonstrate the following deficits muscle weakness and decreased standing balance, decreased postural control and decreased balance strategies and therefore will continue to benefit from skilled PT intervention to increase functional independence with mobility.  Patient progressing toward long term goals..  Continue plan of care.  PT Short Term Goals Week 2:  PT Short Term Goal 1 (Week 2): pt will perform sit to stand with max A PT Short Term Goal 1 - Progress (Week 2): Met PT Short Term Goal 2 (Week 2): pt will consistently perform transfers with +1 max A PT Short Term Goal 2 - Progress (Week 2): Met Week 3:  PT Short Term Goal 1 (Week 3): pt will perform stand pivot transfer with mod A PT Short Term Goal 2 (Week 3): pt will propel w/c with supervision 25' in controlled environment  Skilled Therapeutic Interventions/Progress Updates:  Ambulation/gait training;Discharge planning;DME/adaptive equipment instruction;Functional mobility training;Splinting/orthotics;Therapeutic Activities;UE/LE Strength taining/ROM;Pain management;Wheelchair propulsion/positioning;UE/LE Coordination activities;Therapeutic Exercise;Stair training;Patient/family education;Neuromuscular re-education;Functional electrical stimulation;Community reintegration;Balance/vestibular training     See Function Navigator for Current Functional  Status.   Cranston Koors 05/22/2017, 8:14 AM

## 2017-05-22 NOTE — Consult Note (Signed)
Reason for Consult:Left lower leg wounds Referring Physician: Dr. Hulda Humphrey, Inpatient rehabilitation  Date : 05/22/17  Inpatient consult [place] [facility]   [name] is an 66 y.o. female.  HPI: [name] an 66 y.o.female with history of HTN, A fib-on pradaxa who was admitted on [facility] on 04/08/17 due to BLE cellulitis with difficulty walking. She developed sepsis with leucocytosis and hypotensive requiring fluid bolus and pressors. Hospital course significant for PEA arrest 7/4, ARDS, anuria requiring  IJ placement by vascular surgery as now HD dependent, shocked liver with abnormal LFTs, thrombocytopenia, ongoing issues with hypotension and tachycardia.   She continues on amiodarone and Cardizem for HR control and Dr. [name] recommended Eliquis due to A fib/stroke prophylaxis. 2 D echo revealed EF 25-30% with mild concentric hypertrophy, moderate AVR, moderate TVR and mild MVR.  Serologic work up negative and limited hope for renal recovery per nephrology--IV albumin being used during HD treatment prn.  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 She had significant lower leg edema and developed wounds over the lower legs. The right lower leg wounds have healed, but the left lower leg wounds have black eschar present and are likely full thickness.  She is able to bear weight on the left foot, but does experience some bleeding with activities while on Eliquis.   Past Medical History:  Diagnosis Date  . A-fib ([place])    on pradaxa  . Chronic kidney disease   . Dysrhythmia   . Hypertension   . Hypothyroidism   . Thyroid disease     Past Surgical History:  Procedure Laterality Date  . DIALYSIS/PERMA CATHETER INSERTION N/A 04/26/2017   Procedure: Dialysis/Perma Catheter Insertion;  Surgeon: Algernon Huxley, MD;  Location: [place] [facility];  Service: Cardiovascular;  Laterality: N/A;  . PR DEBRIDEMENT, SKIN, SUB-Q TISSUE,=<20 SQ  CM  05/21/2017      . PR DEBRIDEMENT, SKIN, SUB-Q TISSUE,EACH ADD 20 SQ CM  05/21/2017      . WRIST ARTHROSCOPY      Family History  Problem Relation Age of Onset  . AAA (abdominal aortic aneurysm) Mother     Social History:  reports that she has never smoked. She has never used smokeless tobacco. She reports that she does not drink alcohol or use drugs.  Allergies: No Known Allergies  Medications: I have reviewed the patient's current medications.  Results for orders placed or performed during the hospital encounter of 05/07/17 (from the past 48 hour(s))  Glucose, capillary     Status: Abnormal   Collection Time: 05/20/17  8:49 PM  Result Value Ref Range   Glucose-Capillary 119 (H) 65 - 99 mg/dL  Glucose, capillary     Status: None   Collection Time: 05/21/17  6:37 AM  Result Value Ref Range   Glucose-Capillary 86 65 - 99 mg/dL   Comment 1 Notify RN   CBC with Differential/Platelet     Status: Abnormal   Collection Time: 05/21/17  9:17 AM  Result Value Ref Range   WBC 10.7 (H) 4.0 - 10.5 K/uL   RBC 2.78 (L) 3.87 - 5.11 MIL/uL   Hemoglobin 9.2 (L) 12.0 - 15.0 g/dL   HCT 28.5 (L) 36.0 - 46.0 %   MCV 102.5 (H) 78.0 - 100.0 fL   MCH 33.1 26.0 - 34.0 pg   MCHC 32.3 30.0 - 36.0 g/dL   RDW 23.9 (H) 11.5 - 15.5 %   Platelets 187  150 - 400 K/uL   Neutrophils Relative % 75 %   Neutro Abs 8.0 (H) 1.7 - 7.7 K/uL   Lymphocytes Relative 13 %   Lymphs Abs 1.3 0.7 - 4.0 K/uL   Monocytes Relative 7 %   Monocytes Absolute 0.8 0.1 - 1.0 K/uL   Eosinophils Relative 5 %   Eosinophils Absolute 0.5 0.0 - 0.7 K/uL   Basophils Relative 0 %   Basophils Absolute 0.0 0.0 - 0.1 K/uL   RBC Morphology POLYCHROMASIA PRESENT   Wet prep, genital     Status: Abnormal   Collection Time: 05/21/17 11:00 AM  Result Value Ref Range   Yeast Wet Prep HPF POC NONE SEEN NONE SEEN   Trich, Wet Prep NONE SEEN NONE SEEN   Clue Cells Wet Prep HPF POC NONE SEEN NONE SEEN   WBC, Wet Prep HPF POC MANY (A) NONE  SEEN   Sperm NONE SEEN   Glucose, capillary     Status: Abnormal   Collection Time: 05/21/17 11:51 AM  Result Value Ref Range   Glucose-Capillary 106 (H) 65 - 99 mg/dL  Renal function panel     Status: Abnormal   Collection Time: 05/21/17  2:00 PM  Result Value Ref Range   Sodium 139 135 - 145 mmol/L   Potassium 4.3 3.5 - 5.1 mmol/L   Chloride 103 101 - 111 mmol/L   CO2 24 22 - 32 mmol/L   Glucose, Bld 151 (H) 65 - 99 mg/dL   BUN 61 (H) 6 - 20 mg/dL   Creatinine, Ser 3.55 (H) 0.44 - 1.00 mg/dL    Comment: DELTA CHECK NOTED   Calcium 8.8 (L) 8.9 - 10.3 mg/dL   Phosphorus 2.6 2.5 - 4.6 mg/dL   Albumin 3.2 (L) 3.5 - 5.0 g/dL   GFR calc non Af Amer 12 (L) >60 mL/min   GFR calc Af Amer 14 (L) >60 mL/min    Comment: (NOTE) The eGFR has been calculated using the CKD EPI equation. This calculation has not been validated in all clinical situations. eGFR's persistently <60 mL/min signify possible Chronic Kidney Disease.    Anion gap 12 5 - 15  Glucose, capillary     Status: None   Collection Time: 05/21/17  6:26 PM  Result Value Ref Range   Glucose-Capillary 76 65 - 99 mg/dL  Glucose, capillary     Status: None   Collection Time: 05/21/17  8:04 PM  Result Value Ref Range   Glucose-Capillary 89 65 - 99 mg/dL  Glucose, capillary     Status: None   Collection Time: 05/22/17  7:08 AM  Result Value Ref Range   Glucose-Capillary 95 65 - 99 mg/dL  Glucose, capillary     Status: Abnormal   Collection Time: 05/22/17 11:36 AM  Result Value Ref Range   Glucose-Capillary 103 (H) 65 - 99 mg/dL    No results found.  ROS Blood pressure 97/74, pulse (!) 116, temperature 98.2 F (36.8 C), temperature source Oral, resp. rate 16, height _0  (1.651 m), weight 71 kg (156 lb 8.4 oz), SpO2 100 %. Physical Exam  Constitutional: She is oriented to person, place, and time.  Thin elderly female in NAD.   HENT:  Head: Normocephalic and atraumatic.  Nose: Nose normal.  Mouth/Throat: Oropharynx  is clear and moist.  Eyes: Pupils are equal, round, and reactive to light. EOM are normal.  Cardiovascular:  Tachycardic with sitting with therapists.   Respiratory: Effort normal. No respiratory distress.  Neurological:  She is alert and oriented to person, place, and time.  Skin:  Left lower leg proximal wound with dark slough and yellow tissue 25%, rest of wound bed The distal wound is 90% dark, green black necrotic tissue, mild edema about ankle and foot.  Left heel soft, but intact.  Small posterior ankle wound is mild slough, some pink tissue. Nothing looks actively infected.     Assessment/Plan: Left lower leg wounds- Patient would benefit from debridement in the OR and placement of Acell to expedite healing.  She would need to be bridged with Lovenox in the peri operative period. She is agreeable to try the Acell as this point.  Will discuss further with Dr. [name] and work towards getting to [place] later this week or sometime next week.   Henrik Orihuela,PA-C Plastic Surgery [phone]

## 2017-05-22 NOTE — Progress Notes (Signed)
Occupational Therapy Session Note  Patient Details  Name: [name] MRN: [id number] Date of Birth: 06-Apr-1951  Today's Date: 05/22/2017 OT Individual Time: 4431-5400 and 8676-1950 OT Individual Time Calculation (min): 70 min and 42 min   Short Term Goals: Week 2:  OT Short Term Goal 1 (Week 2): Pt will complete sit > stand in [place] with one person assist to decrease burden of care OT Short Term Goal 2 (Week 2): Pt will don pants with max assist at bed level utilizing AE PRN and circle sitting technique OT Short Term Goal 3 (Week 2): Pt will don shirt with supervision in unsupported sitting on EOB OT Short Term Goal 4 (Week 2): Pt will complete slide board transfer with mod assist of one caregiver  Skilled Therapeutic Interventions/Progress Updates:    1) Treatment session with focus on dynamic sitting balance and functional use of BUE during bathing and dressing.  Pt received in bed willing to engage in bathing at shower level.  Wrapped LLE prior to bathing to keep bandages dry, however plastic surgery arrived to assess LLE.  Discussed d/c planning and healing while plastic surgery present.  Re-wrapped LLE for bathing.  Completed sit > stand in [place] with +2 and mod cues for weight shift to increase participation in transitional movement.  Bathing completed seated on BSC to allow pt to increase participation in washing perineal area and buttocks.  Pt with limited shoulder flexion this session requiring assist to wash Rt side of hair and assist when pulling shift over shoulders and trunk.  Pt thread Lt pant leg after setup to achieve figure 4 position, required assist to maintain position to thread Rt pant leg.  Engaged in sit > stand with mod assist of +2 with cues for hand placement and anterior weight shift.  Pt maintained static standing balance with min assist while 2nd person pulled pants over hips.  Setup with breakfast tray with pt requiring assist to open milk carton.   2) Treatment  session with focus on functional transfers and BUE strengthening and ROM.  Pt received upright in w/c reporting fatigue from AM sessions but willing to engage in therapy session.  Completed lateral scoot transfers with slide board with mod assist.  Engaged in ball toss and ball tapping with 1# dowel rod with focus on sitting balance and increased AROM in BUE.  Engaged in Connect 4 activity in unsupported sitting with focus on increased shoulder flexion in Rt and Lt UE with pt continuing to require compensatory measures of use of table top or "walking" hand up vertical support to increase height.  Returned to w/c as above and left upright in w/c with all needs in reach.  Therapy Documentation Precautions:  Precautions Precautions: Fall Restrictions Weight Bearing Restrictions: No Pain:  Pt with no c/o pain  See Function Navigator for Current Functional Status.   Therapy/Group: Individual Therapy  [name] 05/22/2017, 9:57 AM

## 2017-05-22 NOTE — Progress Notes (Signed)
Nutrition Follow-up  DOCUMENTATION CODES:   Not applicable  INTERVENTION:  Continue 30 ml Prostat po BID, each supplement provides 100 kcal and 15 grams of protein.   Discontinue Nepro shake.   Encourage adequate PO intake.   NUTRITION DIAGNOSIS:   Increased nutrient needs related to chronic illness as evidenced by estimated needs; ongoing  GOAL:   Patient will meet greater than or equal to 90% of their needs; met  MONITOR:   PO intake, Supplement acceptance, Labs, Weight trends, Skin, I & O's  REASON FOR ASSESSMENT:   Consult Diet education  ASSESSMENT:   66 y.o. female with history of HTN, A fib-on pradaxa who was admitted on [facility] on 04/08/17 due to BLE cellulitis with difficulty walking. She was found to be septic with leucocytosis and hypotensive. CRRT initiated and  hospital course significant for PEA arrest 7/4, ARDS, anuria requiring  IJ placement by vascular surgery as now HD dependent, shocked liver with abnormal LFTs, thrombocytopenia, ongoing issues with hypotension and tachycardia. 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  Meal completion has been 90-100% recently. Pt reports having a good appetite. Pt currently has Prostat ordered and has been consuming them. Pt additionally has Nepro Shake ordered however has been refusing them. RD to discontinue Nepro. Pt encouraged to eat her food at meals. Labs and medications reviewed.   Diet Order:  Diet regular Room service appropriate? Yes; Fluid consistency: Thin; Fluid restriction: 1200 mL Fluid  Skin:   (Non pressure wound on buttocks)  Last BM:  8/13  Height:   Ht Readings from Last 1 Encounters:  05/07/17 _0  (1.651 m)    Weight:   Wt Readings from Last 1 Encounters:  05/21/17 156 lb 8.4 oz (71 kg)    Ideal Body Weight:  56.8 kg  BMI:  Body mass index is 26.05 kg/m.  Estimated Nutritional Needs:   Kcal:  1800-2000  Protein:  90-110 grams  Fluid:   1.2 L/day  EDUCATION NEEDS:   Education needs addressed  Corrin Parker, MS, RD, LDN Pager # [phone] After hours/ weekend pager # [phone]

## 2017-05-22 NOTE — Progress Notes (Signed)
Physical Therapy Session Note  Patient Details  Name: [name] MRN: [id number] Date of Birth: 09-23-1951  Today's Date: 05/22/2017 PT Individual Time: 1400-1430 PT Individual Time Calculation (min): 30 min   Short Term Goals: Week 1:  PT Short Term Goal 1 (Week 1): pt will perform functional transfers wtih max A PT Short Term Goal 1 - Progress (Week 1): Progressing toward goal PT Short Term Goal 2 (Week 1): pt will perform sit to stand with max A PT Short Term Goal 2 - Progress (Week 1): Not met Week 2:  PT Short Term Goal 1 (Week 2): pt will perform sit to stand with max A PT Short Term Goal 1 - Progress (Week 2): Met PT Short Term Goal 2 (Week 2): pt will consistently perform transfers with +1 max A PT Short Term Goal 2 - Progress (Week 2): Met  Skilled Therapeutic Interventions/Progress Updates:   Pt excited about her progress today with standing and gait. Attempted to perform multiple sit <> stands in parallel bars but pt unable to achieve full standing this afternoon due to fatigue. Adjusted activity to seated core strengthening and functional reaching activity to address UE ROM and trunk control - supervision to min assist to come from forward flexed position back up to upright posture with cues for elbow walking technique. Min assist slideboard transfer back to bed with cues for head/hips relationship and hand placement. Mod assist for LE management to return to supine and to scoot hips over in bed.   Therapy Documentation Precautions:  Precautions Precautions: Fall Restrictions Weight Bearing Restrictions: No  Pain: Denies pain.    See Function Navigator for Current Functional Status.   Therapy/Group: Individual Therapy  Canary Brim Ivory Broad, PT, DPT  05/22/2017, 2:59 PM

## 2017-05-23 ENCOUNTER — Inpatient Hospital Stay (HOSPITAL_COMMUNITY): Payer: Medicare Other | Admitting: Occupational Therapy

## 2017-05-23 ENCOUNTER — Inpatient Hospital Stay (HOSPITAL_COMMUNITY): Payer: Medicare Other

## 2017-05-23 LAB — CBC
HEMATOCRIT: 26.3 % — AB (ref 36.0–46.0)
Hemoglobin: 8.3 g/dL — ABNORMAL LOW (ref 12.0–15.0)
MCH: 32.2 pg (ref 26.0–34.0)
MCHC: 31.6 g/dL (ref 30.0–36.0)
MCV: 101.9 fL — AB (ref 78.0–100.0)
PLATELETS: 165 10*3/uL (ref 150–400)
RBC: 2.58 MIL/uL — ABNORMAL LOW (ref 3.87–5.11)
RDW: 23.6 % — AB (ref 11.5–15.5)
WBC: 6 10*3/uL (ref 4.0–10.5)

## 2017-05-23 LAB — RENAL FUNCTION PANEL
ANION GAP: 10 (ref 5–15)
Albumin: 3 g/dL — ABNORMAL LOW (ref 3.5–5.0)
BUN: 43 mg/dL — AB (ref 6–20)
CO2: 29 mmol/L (ref 22–32)
Calcium: 8.7 mg/dL — ABNORMAL LOW (ref 8.9–10.3)
Chloride: 100 mmol/L — ABNORMAL LOW (ref 101–111)
Creatinine, Ser: 2.71 mg/dL — ABNORMAL HIGH (ref 0.44–1.00)
GFR calc Af Amer: 20 mL/min — ABNORMAL LOW (ref >60)
GFR calc non Af Amer: 17 mL/min — ABNORMAL LOW (ref >60)
GLUCOSE: 75 mg/dL (ref 65–99)
POTASSIUM: 4 mmol/L (ref 3.5–5.1)
Phosphorus: 3.1 mg/dL (ref 2.5–4.6)
Sodium: 139 mmol/L (ref 135–145)

## 2017-05-23 LAB — HEPARIN LEVEL (UNFRACTIONATED): Heparin Unfractionated: >2.2 IU/mL — ABNORMAL HIGH (ref 0.30–0.70)

## 2017-05-23 LAB — GLUCOSE, CAPILLARY
Glucose-Capillary: 81 mg/dL (ref 65–99)
Glucose-Capillary: 101 mg/dL — ABNORMAL HIGH (ref 65–99)
Glucose-Capillary: 73 mg/dL (ref 65–99)

## 2017-05-23 LAB — APTT: APTT: 37 s — AB (ref 24–36)

## 2017-05-23 MED ORDER — DARBEPOETIN ALFA 60 MCG/0.3ML IJ SOSY
PREFILLED_SYRINGE | INTRAMUSCULAR | Status: AC
  Filled 2017-05-23: qty 0.3

## 2017-05-23 MED ORDER — HEPARIN SODIUM (PORCINE) 1000 UNIT/ML DIALYSIS: 20.0000 [IU]/kg | INTRAMUSCULAR | Status: DC | PRN

## 2017-05-23 MED ORDER — HEPARIN (PORCINE) IN NACL 100-0.45 UNIT/ML-% IJ SOLN
650.0000 [IU]/h | INTRAMUSCULAR | Status: DC
  Administered 2017-05-23: 1000 [IU]/h via INTRAVENOUS
  Administered 2017-05-24: 800 [IU]/h via INTRAVENOUS
  Administered 2017-05-25: 650 [IU]/h via INTRAVENOUS
  Filled 2017-05-23 (×4): qty 250

## 2017-05-23 MED ORDER — MIDODRINE HCL 5 MG PO TABS
ORAL_TABLET | ORAL | Status: AC
  Filled 2017-05-23: qty 2

## 2017-05-23 NOTE — Patient Care Conference (Signed)
Inpatient RehabilitationTeam Conference and Plan of Care Update Date: 05/23/2017   Time: 2:30  PM    Patient Name: [name]      Medical Record Number: [id number]  Date of Birth: [birth year] Sex: Female         Room/Bed: 4W06C/4W06C-01 Payor Info: Payor: Theme park manager MEDICARE / Plan: UHC MEDICARE / Product Type: *No Product type* /    Admitting Diagnosis: Critical Illness  Admit Date/Time:  05/07/2017  6:16 PM Admission Comments: No comment available   Primary Diagnosis:  <principal problem not specified> Principal Problem: <principal problem not specified>  Patient Active Problem List   Diagnosis Date Noted  . ESRD (end stage renal disease) ([place])   . Vaginal discharge   . AKI (acute kidney injury) ([place])   . Bleeding   . A-fib ([place])   . Abnormal urinalysis   . Dependence on hemodialysis ([place])   . Ulcer of left lower extremity with fat layer exposed ([place])   . Skin ulcer of left foot with fat layer exposed ([place])   . Vaginal bleeding   . Hypoglycemia associated with diabetes ([place])   . Skin ulcer of right heel ([place])   . PAF (paroxysmal atrial fibrillation) ([place])   . Hypotension   . Leukocytosis   . Anemia of chronic disease   . Anxiety state   . Depression   . Prediabetes   . Stage 5 chronic kidney disease not on chronic dialysis ([place])   . 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 Pressure injury of skin 04/19/2017  . Acute renal failure (ARF) ([place])   . Cellulitis of lower extremity   . Edema extremities   . Acute respiratory failure ([place])   . Acute renal failure ([place])   . Multi-organ failure with heart failure ([place])   . Palliative care by specialist   . Goals of care, counseling/discussion   . Septic shock ([place]) 04/08/2017    Expected Discharge Date: Expected Discharge Date: 06/07/17  Team Members Present: Physician leading conference: Dr. Delice Lesch Social Worker Present: Lennart Pall, LCSW Nurse Present: Dorthula Nettles, RN PT Present: Leavy Cella, PT OT  Present: [name], OT PPS Coordinator present : Ileana Ladd, PT     Current Status/Progress Goal Weekly Team Focus  Medical   Deficits in mobility and ability to carry out ADL tasks secondary to CIM  Improve mobility, strength, wounds, vaginal discharge/bleeding  See above   Bowel/Bladder   patient is continent of bowel and bladder (ogliuric and last BM yesterday)  Mod A  Assess and treat for constipation and urinary retention q shift   Swallow/Nutrition/ Hydration             ADL's   mod assist slide board transfers to unlevel surfaces, min assist LB bathing and dressing at bed level, +2 for any functional standing  Min assist overall  ADL retraining, BUE strengthening, tranfers, standing   Mobility   max A squat pivot, min A sliding board, total A to stand  min A w/c level (gait goal for PT only)  strength, activity tolerance, transfers   Communication             Safety/Cognition/ Behavioral Observations  patient remains free from falls/ injury  patient remains free from falls/ injury  assess safety and address needs q shift   Pain   NO pain  Pain score of 0  Assess and treat for pain q shift   Skin   MASD with shearing to buttocks, left leg  in room debridment earlier this week  mod assist  Assess skin qshift and perform dressing changes per MD order      *See Care Plan and progress notes for long and short-term goals.     Barriers to Discharge  Current Status/Progress Possible Resolutions Date Resolved   Physician    Medical stability;Wound Care;Hemodialysis;Lack of/limited family support;Other (comments);Pending surgery  Vaginal bleeding/discharge, Ulcers  See above  Therapies, follow labs, HD recs per Neprho, Gyn follow up as outpt, PRS for debridement      Nursing                  PT  Inaccessible home environment;Wound Care;Hemodialysis;Medical stability  vaginal discharge/bleeding limiting participation, 4 stairs to enter home, needs to be able to transfer  to/from dialysis              OT                  SLP                SW                Discharge Planning/Teaching Needs:  Plan home with spouse who can provide 24/7 assistance.  Teaching prior to d/c    Team Discussion:  WBC relatively stable;  Plan for debridement of left foot.  Decreased vaginal bleeding - follow up with gyn as OP.  No c/o pain.  Improving overall;  Currently min/mod assist but not consistent.  Sitting balance much improved.  Still +2 for standing.  Fearful and anxious and causes difficulty for sequencing.  Uncertain if cognitive issues vs. Anxiety.  Does do better with repetition.  Team recommends extension of LOS and recommend change in d/c date to 8/30 to reach a consistent min assist level.  Revisions to Treatment Plan:  Change in d/c date.    Continued Need for Acute Rehabilitation Level of Care: The patient requires daily medical management by a physician with specialized training in physical medicine and rehabilitation for the following conditions: Daily direction of a multidisciplinary physical rehabilitation program to ensure safe treatment while eliciting the highest outcome that is of practical value to the patient.: Yes Daily medical management of patient stability for increased activity during participation in an intensive rehabilitation regime.: Yes Daily analysis of laboratory values and/or radiology reports with any subsequent need for medication adjustment of medical intervention for : Neurological problems;Blood pressure problems;Diabetes problems;Renal problems;Other  Makale Pindell, [place] 05/23/2017, 3:24 PM

## 2017-05-23 NOTE — Progress Notes (Signed)
Occupational Therapy Session Note  Patient Details  Name: [name] MRN: [id number] Date of Birth: 03-10-1951  Today's Date: 05/23/2017 OT Individual Time: 1030-1100 OT Individual Time Calculation (min): 30 min    Short Term Goals: Week 1:  OT Short Term Goal 1 (Week 1): Pt will complete sit > stand in [place] with one person assist to decrease burden of care OT Short Term Goal 1 - Progress (Week 1): Progressing toward goal OT Short Term Goal 2 (Week 1): Pt will complete slide board transfer with max assist of one caregiver OT Short Term Goal 2 - Progress (Week 1): Met OT Short Term Goal 3 (Week 1): Pt will complete don pants with max assist at bed level OT Short Term Goal 3 - Progress (Week 1): Progressing toward goal  Skilled Therapeutic Interventions/Progress Updates:    1:1 Pt received in bed. Focus on coming to EOB with mod A with extra time. Focus on terminate sit to stand in [place] with walking sling for increased support as well as standing tolerance and maintaining upright posture with more dynamic movement with bilateral UE support. Pt only able to tolerate standing for ~3 min before flexed posture/ requesting seated rest break. Transferred into w/c via slide board with min A.  Therapy Documentation Precautions:  Precautions Precautions: Fall Restrictions Weight Bearing Restrictions: No Pain:  no c/o pain just fatigue.   See Function Navigator for Current Functional Status.   Therapy/Group: Individual Therapy  Willeen Cass [facility] 05/23/2017, 3:08 PM

## 2017-05-23 NOTE — Progress Notes (Signed)
Occupational Therapy Weekly Progress Note  Patient Details  Name: [name] MRN: [id number] Date of Birth: Jul 06, 1951  Beginning of progress report period: May 15, 2017 End of progress report period: May 23, 2017  Today's Date: 05/23/2017 OT Individual Time: 9024-0973 and 5329-9242 OT Individual Time Calculation (min): 58 min and 40 min   Patient has met 4 of 4 short term goals.  Pt making steady progress towards goals.  Pt currently able to thread BLE into pants at bed level or with figure 4 sitting with assist to obtain position.  Pt completes basic transfers with slide board at min-mod assist.  Continues to require max assist for sit > stand from elevated surfaces, +2 for functional tasks in standing.  Pt continues to have limited AROM in BUE with Lt> Rt requiring propped support for movement against gravity.  Pt will benefit from hands on family education prior to d/c home to ensure husband can provide min assist level care.  Patient continues to demonstrate the following deficits: muscle weakness, decreased cardiorespiratoy enduranceand decreased standing balance and decreased balance strategies and therefore will continue to benefit from skilled OT intervention to enhance overall performance with BADL and Reduce care partner burden.  Patient progressing toward long term goals..  Continue plan of care.  OT Short Term Goals Week 2:  OT Short Term Goal 1 (Week 2): Pt will complete sit > stand in [place] with one person assist to decrease burden of care OT Short Term Goal 1 - Progress (Week 2): Met OT Short Term Goal 2 (Week 2): Pt will don pants with max assist at bed level utilizing AE PRN and circle sitting technique OT Short Term Goal 2 - Progress (Week 2): Met OT Short Term Goal 3 (Week 2): Pt will don shirt with supervision in unsupported sitting on EOB OT Short Term Goal 3 - Progress (Week 2): Met OT Short Term Goal 4 (Week 2): Pt will complete slide board transfer with mod  assist of one caregiver OT Short Term Goal 4 - Progress (Week 2): Met Week 3:  OT Short Term Goal 1 (Week 3): Pt will demonstrate ability to pull pants over hips at sit > stand level with mod assist for standing balance OT Short Term Goal 2 (Week 3): Pt will complete hygiene post toileting with mod assist for standing balance with use of AE PRN OT Short Term Goal 3 (Week 3): Pt will complete toilet transfers with min assist  Skilled Therapeutic Interventions/Progress Updates:    1) Treatment session with focus on increased problem solving with LB bathing and dressing.  Pt continues to require mod cues for sequencing and problem solving, however is beginning to demonstrate increased carryover with repetition.  Mod assist to achieve upright sitting in bed in preparation for LB bathing and dressing with circle sitting and figure 4 position.  Pt able to thread BLE into pants this session at bed level, requiring assist to pull up in back with rolling.  UB bathing and dressing completed with supervision seated at EOB with much improved sitting balance.  Completed slide board transfer to w/c with mod assist initially progressing to min.  Grooming tasks completed sitting in w/c.  Left upright to eat breakfast, pt required assist to open containers.  2) Treatment session with focus on trunk control with lateral scoots and sit > stand.  Pt continues to require mod cues for weight shift and foot placement to increase participation in functional movements.  Utilized Geologist, engineering in front of  pt to provide visual feedback.  Blocked practice sit > stand with +2 for anterior weight shift and multimodal cues for upright standing posture. Pt with improved success when recalling to utilize mirror.  Pt continues to require assist to lift off buttocks during lateral scoots and squat pivot transfers.  Squat pivot w/c <> therapy mat with mod assist.  Returned to bed at end of session in preparation for HD with mod assist with slide  board.  Therapy Documentation Precautions:  Precautions Precautions: Fall Restrictions Weight Bearing Restrictions: No General:   Vital Signs: Therapy Vitals Temp: 98.3 F (36.8 C) Temp Source: Oral Pulse Rate: (!) 101 Resp: 18 BP: 98/70 Patient Position (if appropriate): Lying Oxygen Therapy SpO2: 99 % O2 Device: Not Delivered Pain:  Pt with no c/o pain  See Function Navigator for Current Functional Status.   Therapy/Group: Individual Therapy  [name] 05/23/2017, 8:31 AM

## 2017-05-23 NOTE — Progress Notes (Signed)
Physical Therapy Session Note  Patient Details  Name: [name] MRN: [id number] Date of Birth: 03-29-1951  Today's Date: 05/23/2017 PT Individual Time: 0900-1015 PT Individual Time Calculation (min): 75 min   Skilled Therapeutic Interventions/Progress Updates:    Session focused on NMR for sit <> stands and gait training. Total +2 for sit <> stands with Rw x 3 reps with mod cues for sequencing and hand placement - poor carryover between trials - and facilitation for anterior weightshift and upright posture. Using maxi sky, engaged in gait training with PT facilitation for weightshifting and maintaining upright trunk posture. Pt demonstrating good step length during gait trials. Pt able to gait x 14', x 18' (with turn required max assist for sequencing turning), and x 8'. Pt demonstrates posterior bias in standing and requires facilitation for weightshift. As fatigues, decreased ability to maintain upright posture and increased cues needed. W/c mobility training with supervision and extra time x 50' for functional mobility and UE strengthening and endurance.  Therapy Documentation Precautions:  Precautions Precautions: Fall Restrictions Weight Bearing Restrictions: No   Pain:  Pt does not c/o pain.    See Function Navigator for Current Functional Status.   Therapy/Group: Individual Therapy  Canary Brim Ivory Broad, PT, DPT  05/23/2017, 10:42 AM

## 2017-05-23 NOTE — Progress Notes (Signed)
[facility] Rounding Note  SUBJECTIVE: Feels well, no complaints. Continues to eat well with liberalized diet.    Interval events:   Seen by plastics who will debride her LLE wounds in OR later this week or early next week.  To have HD today.   Intake/Output Summary (Last 24 hours) at 05/23/17 0900 Last data filed at 05/22/17 1900  Gross per 24 hour  Intake              380 ml  Output                0 ml  Net              380 ml   Filed Weights   05/21/17 1419 05/21/17 1751 05/23/17 0601  Weight: 158 lb 11.7 oz (72 kg) 156 lb 8.4 oz (71 kg) 154 lb 1.6 oz (69.9 kg)   OBJECTIVE Physical Exam   VITALS: Blood pressure 98/70, pulse (!) 101, temperature 98.3 F (36.8 C), temperature source Oral, resp. rate 18, height 5\' 5"  (1.651 m), weight 154 lb 1.6 oz (69.9 kg), SpO2 99 %. GEN: Alert and oriented, pleasant and conversant. Right IJ permacath (placed 04/26/17) CV: IRIR, Diastolic murmur PULM: CTAB, able to speak in complete sentences. Normal WOB on RA.  ABD: Soft, nontender. Not distended. SKIN: No rash. LLE with clean dry bandages.  EXT: Minimal LE edema.  Basic Metabolic Panel:  Recent Labs  05/21/17 1400 05/23/17 0526  NA 139 139  K 4.3 4.0  CL 103 100*  CO2 24 29  GLUCOSE 151* 75  BUN 61* 43*  CREATININE 3.55* 2.71*  CALCIUM 8.8* 8.7*  PHOS 2.6 3.1    Recent Labs  05/21/17 1400 05/23/17 0526  ALBUMIN 3.2* 3.0*    Recent Labs  05/21/17 0917  WBC 10.7*  NEUTROABS 8.0*  HGB 9.2*  HCT 28.5*  MCV 102.5*  PLT 187   Current IP Medications . amiodarone  200 mg Oral BID  . apixaban  2.5 mg Oral BID  . collagenase   Topical Daily  . darbepoetin (ARANESP) injection - DIALYSIS  60 mcg Intravenous Q Wed-HD  . diltiazem  120 mg Oral Daily  . famotidine  20 mg Oral QHS  . feeding supplement (PRO-STAT SUGAR FREE 64)  30 mL Oral BID  . hydrocerin   Topical BID  . levothyroxine  100 mcg Oral QAC breakfast  . megestrol  40 mg Oral BID  . midodrine  10 mg Oral TID WC  .  multivitamin  1 tablet Oral QHS  . QUEtiapine  25 mg Oral QHS  . senna-docusate  2 tablet Oral QHS  . sertraline  25 mg Oral QHS   Continuous Infusion Medications: None  PRN Medications:  acetaminophen, aluminum hydroxide, artificial tears, bisacodyl, diphenhydrAMINE, guaiFENesin-dextromethorphan, ipratropium-albuterol, ondansetron (ZOFRAN) IV, phenol, polyethylene glycol, prochlorperazine **OR** prochlorperazine **OR** prochlorperazine, zinc oxide  Assessment & Plan:   66yo female admitted to [facility] 04/08/17 with septic/cardiogenic shock with multiorgan failure including anuric AKI for which she was started on CRRT and transitioned to intermittent HD (MWF). She remains HD dependent producing small volumes of urine. She is now in physical rehabilitation (transferred here 05/08/17) (On record review creatinine 09/2014 0.62, 1.94 on presentation to [facility] 04/08/17 with LE cellulitis, anasarca, AKI, Serologic work up there complements normal, negative SPEP/UPEP, ANA negative, ANCA negative, GBM negative, negative hepatitis). Was declared new ESRD prior to transfer w/eventual plan for outpt HD TTS at [facility] unit.  New issue during rehab  vaginal bleeding + discharge with finding of thickened endometrium and a mass  1. 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 HD today. 1. Will need transition to TTS towards end of hospitalization 2. Vein mapping completed, consult [facility] towards end of stay 3. Will change to TTS at discharge - [place] [facility] ([place]) Will continue with MWF inpatient.   2. Hypotension - On Midodrine for continued pressure support. BP stable.  1. Continue Midodrine 10mg  tid 3. Anemia, Iron deficiency - S/p 1 dose of aranesp 60 ono 8/8,  and 2 doses of Ferrlecit 250 each 8/5  1. Hb 9.2 and stable 2. Monitor 4. Hyperphosphatemia - on 8/9 phos was <1.0, 8/10 was 1.8 with hopes that liberalized diet would improve however 1.1 8/11. Renvela d/c 8/11.   1. Phos 3.1 today 2. Continue to monitor closely off binders with daily renal function panels 5. A fib - on diltiazem, amiodarone; eliquis 6. Vaginal bleeding & discharge - pelvic US with thickened endometrial lining at 22 mm and a 3.7cm hyperechoic mass was noted. Primary dw GYN who recs outpatient biopsy. 1. Getting Megace 2. Wet prep noted WBC 3.  gram stain, gon/chlamydia neg  Bethany Molt, DO PGY-2 Internal Medicine    I have seen and examined this patient and agree with plan and assessment in the above note with renal recommendations/intervention highlighted.  Continue with HD qMWF for now and when closer to discharge will change to TTS due to outpatient schedule at the [facility] HD clinic. Governor Rooks Mykayla Brinton,MD 05/23/2017 12:58 PM

## 2017-05-23 NOTE — Progress Notes (Signed)
[place] PHYSICAL MEDICINE & REHABILITATION     PROGRESS NOTE  Subjective/Complaints:  Pt seen laying in bed this AM.  She slept well overnight.  She states she has not seen surgery yet, however it appears she was seen yesterday. She is worries about anesthesia.   ROS: Denies nausea, vomiting, diarrhea, shortness of breath or chest pain   Objective: Vital Signs: Blood pressure 98/70, pulse (!) 101, temperature 98.3 F (36.8 C), temperature source Oral, resp. rate 18, height 5\' 5"  (1.651 m), weight 69.9 kg (154 lb 1.6 oz), SpO2 99 %. No results found.  Recent Labs  05/21/17 0917  WBC 10.7*  HGB 9.2*  HCT 28.5*  PLT 187    Recent Labs  05/21/17 1400 05/23/17 0526  NA 139 139  K 4.3 4.0  CL 103 100*  GLUCOSE 151* 75  BUN 61* 43*  CREATININE 3.55* 2.71*  CALCIUM 8.8* 8.7*   CBG (last 3)   Recent Labs  05/22/17 1637 05/22/17 2154 05/23/17 0659  GLUCAP 102* 109* 81    Wt Readings from Last 3 Encounters:  05/23/17 69.9 kg (154 lb 1.6 oz)  05/07/17 75.3 kg (166 lb 0.1 oz)    Physical Exam:  BP 98/70 (BP Location: Right Arm)   Pulse (!) 101   Temp 98.3 F (36.8 C) (Oral)   Resp 18   Ht 5\' 5"  (1.651 m)   Wt 69.9 kg (154 lb 1.6 oz)   SpO2 99%   BMI 25.64 kg/m  Constitutional: She appears well-developed and well-nourished.  HENT: Normocephalic. Atraumatic. Eyes: EOMI. No discharge.  Cardiovascular: IRIR Respiratory: CTA Bilaterally. Normal effort.  GI: Soft. Bowel sounds are normal.   Musculoskeletal: She exhibits edema and tenderness.  Neurological: She is alert and oriented.  Able to follow commands without difficulty.  Motor: B/l UE 4/5 proximal to distal RLE: HF 3/5, KE 3+/5, ADF/PF 4/5  LLE: HF 3-/5, KE 3/5, ADF/PF 3+/5 (stable) Skin: LLE with ulcerations with eschar on dorsum of foot and distal tibia with dressing c/d/i.  Psychiatric: She has a normal mood and affect. Her behavior is normal. Thought content normal.    Assessment/Plan: 1.  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 Physiatrist is providing close team supervision and 24 hour management of active medical problems listed below. Physiatrist and rehab team continue to assess barriers to discharge/monitor patient progress toward functional and medical goals.  Function:  Bathing Bathing position   Position: Shower  Bathing parts Body parts bathed by patient: Right arm, Left arm, Chest, Abdomen, Right upper leg, Left upper leg Body parts bathed by helper: Front perineal area, Buttocks, Right lower leg, Back  Bathing assist Assist Level: Touching or steadying assistance(Pt > 75%) (Mod assist, steady for sitting balance)      Upper Body Dressing/Undressing Upper body dressing   What is the patient wearing?: Pull over shirt/dress     Pull over shirt/dress - Perfomed by patient: Put head through opening, Thread/unthread left sleeve, Thread/unthread right sleeve Pull over shirt/dress - Perfomed by helper: Pull shirt over trunk        Upper body assist Assist Level: Touching or steadying assistance(Pt > 75%) (Min assist)   Set up : To obtain clothing/put away  Lower Body Dressing/Undressing Lower body dressing   What is the patient wearing?: Pants, Non-skid slipper socks     Pants- Performed by patient: Thread/unthread left pants leg Pants- Performed by helper:  Thread/unthread right pants leg, Pull pants up/down Non-skid slipper socks- Performed by patient: Don/doff right sock Non-skid slipper socks- Performed by helper: Don/doff right sock                  Lower body assist Assist for lower body dressing: 2 Helpers (for sit > stand)      Toileting Toileting Toileting activity did not occur: N/A (anuric, no bm, no toileting performed)   Toileting steps completed by helper: Adjust clothing prior to toileting, Performs perineal hygiene, Adjust clothing  after toileting    Toileting assist Assist level: Two helpers   Transfers Chair/bed transfer   Chair/bed transfer method: Lateral scoot Chair/bed transfer assist level: Touching or steadying assistance (Pt > 75%) Chair/bed transfer assistive device: Armrests, Sliding board Mechanical lift: Stedy   Locomotion Ambulation Ambulation activity did not occur: Safety/medical concerns   Max distance: 8 Assist level: Moderate assist (Pt 50 - 74%)   Wheelchair   Type: Manual Max wheelchair distance: 10' Assist Level: Touching or steadying assistance (Pt > 75%)  Cognition Comprehension Comprehension assist level: Follows basic conversation/direction with no assist  Expression Expression assist level: Expresses basic needs/ideas: With no assist  Social Interaction Social Interaction assist level: Interacts appropriately with others - No medications needed.  Problem Solving Problem solving assist level: Solves basic 90% of the time/requires cueing < 10% of the time  Memory Memory assist level: Recognizes or recalls 90% of the time/requires cueing < 10% of the time    Medical Problem List and Plan: 1.  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  DVT Prophylaxis/Anticoagulation: Pharmaceutical: Other (comment)--Eliquis will bridge for surgery 3. Pain Management: tylenol prn 4. Mood: LCSW to follow for evaluation and support.  5. Neuropsych: This patient is capable of making decisions on her own behalf. 6. Skin/Wound Care: routine pressure relief measures. Prevalon boot for heel ulcer.  7. Fluids/Electrolytes/Nutrition: Strict I/Os.   8.  Hypotension: Monitor orthostatic BP. Added abdominal binder and TEDs to help with BP support.    Vitals:   05/22/17 1300 05/23/17 0601  BP: 120/68 98/70  Pulse: (!) 118 (!) 101  Resp: 18 18  Temp: 98.2 F (36.8 C) 98.3 F (36.8 C)  SpO2: 99% 99%   Controlled 8/15 9. Paroxysmal Afib:  on eliquis for stroke  prevention. Continue amiodarone bid and Cardizem daily.  10. Leukocytosis:              Patient with ulcers--continue with WOC recs/daily dressing             Afebrile             WBCs 10.7 on 8/13             Cont to monitor 11. Anemia of chronic disease: On epogen  MWF.              Hb 9.2 on 8/13             Cont to montior 12. Left foot/leg ulcer: Daily dressing changes with santyl. Monitor for signs of infection.   Debrided 8/6, again 8/13  PRS for debridement in OR end of this week/next week 13. Anxiety/depression: On Seroquel and Zoloft at nights.  14. Prediabetes: CBG (last 3)   Recent Labs  05/22/17 1637 05/22/17 2154 05/23/17 0659  GLUCAP 102* 109* 81   Relatively controlled 8/15 15. CKD now HD dependent: HD on MWF at end of day  to help with tolerance of therapy. Renal diet--Reducate patient on appropriate diet.  16. Vaginal bleeding  Per PCP no issues prior  Gyn consulted, Megace initiated, transvaginal U/S showing endometrial thickening and mass.  Contacted Gyn, per recs, outpt workup to rule out CA.  Improved at present 17. Abnormal UA  UA +, Ucx with multiple species 18. Vaginal discharge  Wet prep with many WBCs  Improved at present LOS (Days) 16 A FACE TO FACE EVALUATION WAS PERFORMED  [name] [name] 05/23/2017 8:25 AM

## 2017-05-23 NOTE — Progress Notes (Signed)
ANTICOAGULATION CONSULT NOTE - Initial Consult  Pharmacy Consult for heparin Indication: atrial fibrillation  No Known Allergies  Patient Measurements: Height: 5\' 5"  (165.1 cm) Weight: 154 lb 1.6 oz (69.9 kg) IBW/kg (Calculated) : 57 Heparin Dosing Weight: 69.9 kg  Vital Signs: Temp: 98.3 F (36.8 C) (08/15 0601) Temp Source: Oral (08/15 0601) BP: 98/70 (08/15 0601) Pulse Rate: 101 (08/15 0601)  Labs:  Recent Labs  05/21/17 0917 05/21/17 1400 05/23/17 0526  HGB 9.2*  --   --   HCT 28.5*  --   --   PLT 187  --   --   CREATININE  --  3.55* 2.71*    Estimated Creatinine Clearance: 20.3 mL/min (A) (by C-G formula based on SCr of 2.71 mg/dL (H)).   Medical History: Past Medical History:  Diagnosis Date  . A-fib ([place])    on pradaxa  . Chronic kidney disease   . Dysrhythmia   . Hypertension   . Hypothyroidism   . Thyroid disease     Medications:  Scheduled:  . amiodarone  200 mg Oral BID  . collagenase   Topical Daily  . darbepoetin (ARANESP) injection - DIALYSIS  60 mcg Intravenous Q Wed-HD  . diltiazem  120 mg Oral Daily  . famotidine  20 mg Oral QHS  . feeding supplement (PRO-STAT SUGAR FREE 64)  30 mL Oral BID  . hydrocerin   Topical BID  . levothyroxine  100 mcg Oral QAC breakfast  . megestrol  40 mg Oral BID  . midodrine  10 mg Oral TID WC  . multivitamin  1 tablet Oral QHS  . QUEtiapine  25 mg Oral QHS  . senna-docusate  2 tablet Oral QHS  . sertraline  25 mg Oral QHS   Infusions:  . heparin      Assessment: 66 yo female with afib will be transitioned to heparin from eliquis due to debridement later this week.  Last eliquis dose was at 7482 on 05/23/17.  Patient has renal dysfunction.  Last Hgb 9.2 and Plt 187 K on 05/21/17  Goal of Therapy:  aPTT 66-102 seconds; heparin level 0.3-0.7 Monitor platelets by anticoagulation protocol: Yes   Plan:  D/c eliquis Start heparin at 1000 units/hr (no bolus) at 2200 tonight 8hr heparin level and  aPTT Daily CBC, heparin level and aPTT F/u resume of eliquis 2.5 mg po BID after surgery later this week  Rigley Niess, Tsz-Yin 05/23/2017,9:51 AM

## 2017-05-24 ENCOUNTER — Inpatient Hospital Stay (HOSPITAL_COMMUNITY): Payer: Medicare Other | Admitting: Physical Therapy

## 2017-05-24 ENCOUNTER — Inpatient Hospital Stay (HOSPITAL_COMMUNITY): Payer: Medicare Other | Admitting: Occupational Therapy

## 2017-05-24 ENCOUNTER — Encounter (HOSPITAL_COMMUNITY): Payer: Self-pay

## 2017-05-24 LAB — CBC
HCT: 27.8 % — ABNORMAL LOW (ref 36.0–46.0)
HEMOGLOBIN: 8.7 g/dL — AB (ref 12.0–15.0)
MCH: 31.8 pg (ref 26.0–34.0)
MCHC: 31.3 g/dL (ref 30.0–36.0)
MCV: 101.5 fL — AB (ref 78.0–100.0)
PLATELETS: 168 10*3/uL (ref 150–400)
RBC: 2.74 MIL/uL — AB (ref 3.87–5.11)
RDW: 23.7 % — ABNORMAL HIGH (ref 11.5–15.5)
WBC: 9.6 10*3/uL (ref 4.0–10.5)

## 2017-05-24 LAB — GLUCOSE, CAPILLARY
GLUCOSE-CAPILLARY: 140 mg/dL — AB (ref 65–99)
GLUCOSE-CAPILLARY: 96 mg/dL (ref 65–99)
Glucose-Capillary: 107 mg/dL — ABNORMAL HIGH (ref 65–99)
Glucose-Capillary: 134 mg/dL — ABNORMAL HIGH (ref 65–99)

## 2017-05-24 LAB — APTT
aPTT: 197 seconds (ref 24–36)
aPTT: 155 seconds — ABNORMAL HIGH (ref 24–36)

## 2017-05-24 LAB — HEPARIN LEVEL (UNFRACTIONATED): Heparin Unfractionated: >2.2 IU/mL — ABNORMAL HIGH (ref 0.30–0.70)

## 2017-05-24 MED ORDER — DILTIAZEM HCL ER COATED BEADS 180 MG PO CP24
180.0000 mg | ORAL_CAPSULE | Freq: Every day | ORAL | Status: DC
  Administered 2017-05-25 – 2017-05-28 (×4): 180 mg via ORAL
  Filled 2017-05-24 (×4): qty 1

## 2017-05-24 NOTE — Progress Notes (Signed)
Occupational Therapy Session Note  Patient Details  Name: [name] MRN: [id number] Date of Birth: [birth year]  Today's Date: 05/24/2017 OT Individual Time: 5397-6734 and OT Individual Time Calculation (min): 59 min and   Short Term Goals: Week 3:  OT Short Term Goal 1 (Week 3): Pt will demonstrate ability to pull pants over hips at sit > stand level with mod assist for standing balance OT Short Term Goal 2 (Week 3): Pt will complete hygiene post toileting with mod assist for standing balance with use of AE PRN OT Short Term Goal 3 (Week 3): Pt will complete toilet transfers with min assist   Skilled Therapeutic Interventions/Progress Updates:   Session 1: Upon entering the room, pt supine in bed with RN present in the room. Physician arriving shortly after and stating pt's therapy must be from bed level this morning. Pt engaged in bathing and dressing from bed level with HOB elevated and pt assisted into circle sitting to increase independence with LB self care tasks. Pt rolling L <> R with min A for hygiene and to pull clothing over hips. Pt remaining in bed at end of session with call bell and all needed items within reach.   Session 2: Pt continues to be on bed rest this session. Pt engaged in B UE and LE exercises prior to this therapy session. OT provided paper handouts regarding energy conservation for self care tasks. Pt actively engaged in conversation with therapist. Pt also verbalizing some concerns she had in regards to coping with current situation. Therapist provided therapeutic use of self and also provided several suggestions for calming/coping strategies at home. Pt seemed encouraged by this. Pt remaining in bed at end of session with call bell and all needs within reach.   Therapy Documentation Precautions:  Precautions Precautions: Fall Restrictions Weight Bearing Restrictions: No  See Function Navigator for Current Functional Status.   Therapy/Group: Individual  Therapy  Gypsy Decant 05/24/2017, 12:22 PM

## 2017-05-24 NOTE — Progress Notes (Signed)
ANTICOAGULATION CONSULT NOTE - Follow Up Consult  Pharmacy Consult for heparin Indication: atrial fibrillation  Labs:  Recent Labs  05/21/17 0917 05/21/17 1400 05/23/17 0526 05/23/17 1032 05/23/17 1600 05/24/17 0550  HGB 9.2*  --   --   --  8.3* 8.7*  HCT 28.5*  --   --   --  26.3* 27.8*  PLT 187  --   --   --  165 168  APTT  --   --   --  37*  --  197*  HEPARINUNFRC  --   --   --  >2.20*  --  >2.20*  CREATININE  --  3.55* 2.71*  --   --   --     Assessment: 66yo female above goal on heparin with initial dosing while Eliquis on hold; unfortunately pt has restricted access on one arm so lab was drawn close to IV site where heparin is infusing though it was drawn downstream.  Goal of Therapy:  Heparin level 66-102 units/ml   Plan:  Will decrease heparin gtt by 3-4 units/kg/hr to 800 units/hr and check PTT in 8hr.  Wynona Neat, PharmD, BCPS  05/24/2017,7:25 AM

## 2017-05-24 NOTE — Progress Notes (Signed)
[place] PHYSICAL MEDICINE & REHABILITATION     PROGRESS NOTE  Subjective/Complaints:  Pt seen laying in bed this Am, about to work with OT.  She slept well overnight.  Informed by nursing regarding elevated aptt.   ROS: Denies nausea, vomiting, diarrhea, shortness of breath or chest pain   Objective: Vital Signs: Blood pressure (!) 103/58, pulse (!) 122, temperature 98.9 F (37.2 C), temperature source Oral, resp. rate 18, height 5\' 5"  (1.651 m), weight 63 kg (138 lb 14.2 oz), SpO2 100 %. No results found.  Recent Labs  05/23/17 1600 05/24/17 0550  WBC 6.0 9.6  HGB 8.3* 8.7*  HCT 26.3* 27.8*  PLT 165 168    Recent Labs  05/21/17 1400 05/23/17 0526  NA 139 139  K 4.3 4.0  CL 103 100*  GLUCOSE 151* 75  BUN 61* 43*  CREATININE 3.55* 2.71*  CALCIUM 8.8* 8.7*   CBG (last 3)   Recent Labs  05/23/17 1123 05/23/17 2011 05/24/17 0613  GLUCAP 101* 73 107*    Wt Readings from Last 3 Encounters:  05/24/17 63 kg (138 lb 14.2 oz)  05/07/17 75.3 kg (166 lb 0.1 oz)    Physical Exam:  BP (!) 103/58 (BP Location: Left Arm)   Pulse (!) 122   Temp 98.9 F (37.2 C) (Oral)   Resp 18   Ht 5\' 5"  (1.651 m)   Wt 63 kg (138 lb 14.2 oz)   SpO2 100%   BMI 23.11 kg/m  Constitutional: She appears well-developed and well-nourished.  HENT: Normocephalic. Atraumatic. Eyes: EOMI. No discharge.  Cardiovascular: IRIR Respiratory: CTA Bilaterally. Normal effort.  GI: Soft. Bowel sounds are normal.   Musculoskeletal: She exhibits edema and tenderness.  Neurological: She is alert and oriented.  Able to follow commands without difficulty.  Motor: B/l UE 4/5 proximal to distal RLE: HF 3/5, KE 3+/5, ADF/PF 4/5  LLE: HF 3-/5, KE 3/5, ADF/PF 3+/5 (Unchanged) Skin: LLE with ulcerations with eschar on dorsum of foot and distal tibia with dressing c/d/i.  Psychiatric: She has a normal mood and affect. Her behavior is normal. Thought content normal.    Assessment/Plan: 1. 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 Physiatrist is providing close team supervision and 24 hour management of active medical problems listed below. Physiatrist and rehab team continue to assess barriers to discharge/monitor patient progress toward functional and medical goals.  Function:  Bathing Bathing position   Position: Bed  Bathing parts Body parts bathed by patient: Right arm, Left arm, Chest, Abdomen, Right upper leg, Left upper leg, Front perineal area, Right lower leg Body parts bathed by helper: Buttocks, Back  Bathing assist Assist Level: Touching or steadying assistance(Pt > 75%)      Upper Body Dressing/Undressing Upper body dressing   What is the patient wearing?: Pull over shirt/dress     Pull over shirt/dress - Perfomed by patient: Put head through opening, Thread/unthread left sleeve, Thread/unthread right sleeve, Pull shirt over trunk Pull over shirt/dress - Perfomed by helper: Pull shirt over trunk        Upper body assist Assist Level: Set up   Set up : To obtain clothing/put away  Lower Body Dressing/Undressing Lower body dressing   What is the patient wearing?: Pants, Non-skid slipper socks     Pants- Performed by patient: Thread/unthread right pants leg, Thread/unthread left pants leg Pants- Performed by helper: Pull pants up/down Non-skid slipper  socks- Performed by patient: Don/doff right sock Non-skid slipper socks- Performed by helper: Don/doff right sock                  Lower body assist Assist for lower body dressing:  (Min assist)      Toileting Toileting Toileting activity did not occur: N/A (anuric, no bm, no toileting performed)   Toileting steps completed by helper: Adjust clothing prior to toileting, Performs perineal hygiene, Adjust clothing after toileting    Toileting assist Assist level: Two helpers   Transfers Chair/bed transfer    Chair/bed transfer method: Lateral scoot Chair/bed transfer assist level: Moderate assist (Pt 50 - 74%/lift or lower) Chair/bed transfer assistive device: Armrests, Sliding board Mechanical lift: Stedy   Locomotion Ambulation Ambulation activity did not occur: Safety/medical concerns   Max distance: 3' Assist level: 2 helpers   Wheelchair   Type: Manual Max wheelchair distance: 10' Assist Level: Touching or steadying assistance (Pt > 75%)  Cognition Comprehension Comprehension assist level: Follows basic conversation/direction with no assist  Expression Expression assist level: Expresses basic needs/ideas: With no assist  Social Interaction Social Interaction assist level: Interacts appropriately with others - No medications needed.  Problem Solving Problem solving assist level: Solves basic 90% of the time/requires cueing < 10% of the time  Memory Memory assist level: Recognizes or recalls 90% of the time/requires cueing < 10% of the time    Medical Problem List and Plan: 1.  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  DVT Prophylaxis/Anticoagulation: Pharmaceutical: Other (comment)--Eliquis on hold, heparin ggt for surgery  Aptt critical value this AM, spoke with pharmacy, bed level AM therapies 3. Pain Management: tylenol prn 4. Mood: LCSW to follow for evaluation and support.  5. Neuropsych: This patient is capable of making decisions on her own behalf. 6. Skin/Wound Care: routine pressure relief measures. Prevalon boot for heel ulcer.  7. Fluids/Electrolytes/Nutrition: Strict I/Os.   8.  Hypotension: Monitor orthostatic BP. Added abdominal binder and TEDs to help with BP support.    Vitals:   05/23/17 2014 05/24/17 0300  BP: 124/83 (!) 103/58  Pulse: (!) 53 (!) 122  Resp: 18 18  Temp: 98.1 F (36.7 C) 98.9 F (37.2 C)  SpO2: 100%    Controlled 8/16 9. Paroxysmal Afib:  on eliquis for stroke prevention. Continue  amiodarone bid   Cardizem increased to 180 on 8/16.  10. Leukocytosis: Resolved              Patient with ulcers--continue with WOC recs/daily dressing             Afebrile             WBCs 8.7 on 8/16             Cont to monitor 11. Anemia of chronic disease: On epogen  MWF.              Hb 8.7 on 8/16             Cont to montior 12. Left foot/leg ulcer: Daily dressing changes with santyl. Monitor for signs of infection.   Debrided 8/6, again 8/13  PRS for debridement in OR end of this week/next week 13. Anxiety/depression: On Seroquel and Zoloft at nights.  14. Prediabetes: CBG (last 3)   Recent Labs  05/23/17 1123 05/23/17 2011 05/24/17 0613  GLUCAP 101* 73 107*   Relatively controlled 8/16 15. CKD now HD dependent:  HD on MWF at end of day to help with tolerance of therapy. Renal diet--Reducate patient on appropriate diet.  16. Vaginal bleeding  Per PCP no issues prior  Gyn consulted, Megace initiated, transvaginal U/S showing endometrial thickening and mass.  Contacted Gyn, per recs, outpt workup to rule out CA.  Improved at present 17. Abnormal UA  UA +, Ucx with multiple species 18. Vaginal discharge  Wet prep with many WBCs  Improved at present  LOS (Days) 17 A FACE TO FACE EVALUATION WAS PERFORMED  Brittiney Dicostanzo Lorie Phenix 05/24/2017 8:14 AM

## 2017-05-24 NOTE — Progress Notes (Signed)
Physical Therapy Note  Patient Details  Name: [name] MRN: [id number] Date of Birth: 1951-01-24 Today's Date: 05/24/2017    Time: [phone] 56 minutes  1:1 No c/o pain.  Treatment performed at bed level per MD orders due to high PTT.  Pt performed AAROM therex for bilat LEs 2 x 10 heel slides, hip abd/add, add squeeze, ankle pumps, SAQ.  Lt UE AROM with theraband shoulder flex, elbow flex, horizontal adduction and diagonals.  Rt UE AAROM shoulder flex and abd.  Bridging for scooting up in bed x 3 attmepts to scoot up in bed with pt requiring assist to bend LEs, able to use UEs to assist with scooting to head of bed.  Rolling and bridging for bed pan placement and clothing management with pt improving ability to perform bed mobility with use of bedrails.  Min A for rolling to both sides.   DONAWERTH,KAREN 05/24/2017, 10:40 AM

## 2017-05-24 NOTE — Significant Event (Signed)
CRITICAL VALUE ALERT  Critical Value: PTT 197   Date & Time Notied: 05/24/17 0720  Provider Notified: Dr. Posey Pronto and Pharmacy   Orders Received/Actions taken: Pharmacy decreased Heparin rate to 74mL/hr. Dr. Posey Pronto aware of rate change.

## 2017-05-24 NOTE — Plan of Care (Signed)
Problem: RH BOWEL ELIMINATION Goal: RH STG MANAGE BOWEL WITH ASSISTANCE STG Manage Bowel with min Assistance.  No BM this shift  Problem: RH SKIN INTEGRITY Goal: RH STG SKIN FREE OF INFECTION/BREAKDOWN Patients skin will remain free from further infection or breakdown with mod assist.  Outcome: Not Progressing Multiple wounds

## 2017-05-24 NOTE — Progress Notes (Signed)
[facility] Rounding Note  SUBJECTIVE: Feels well, no complaints. Working with PT.   Interval events:   Producing urine. States staff not measuring. Uneventful HD yest. PTT 197, heparin decr accordingly.  Plastics to debride LLE wounds next week.  New DC tentative date 8/30.    Intake/Output Summary (Last 24 hours) at 05/24/17 0859 Last data filed at 05/24/17 0848  Gross per 24 hour  Intake              821 ml  Output                0 ml  Net              821 ml   Filed Weights   05/23/17 0601 05/23/17 1526 05/24/17 0600  Weight: 154 lb 1.6 oz (69.9 kg) 116 lb 13.5 oz (53 kg) 138 lb 14.2 oz (63 kg)   OBJECTIVE Physical Exam   VITALS: Blood pressure (!) 103/58, pulse (!) 122, temperature 98.9 F (37.2 C), temperature source Oral, resp. rate 18, height 5\' 5"  (1.651 m), weight 138 lb 14.2 oz (63 kg), SpO2 100 %. GEN: Alert and oriented, pleasant and conversant. Right IJ permacath (placed 04/26/17) CV: IRIR, Diastolic murmur PULM: CTAB, able to speak in complete sentences. Normal WOB on RA.  ABD: Soft, nontender. Not distended. SKIN: No rash. LLE with clean dry bandages.  EXT: Minimal LE edema.  Basic Metabolic Panel:  Recent Labs  05/21/17 1400 05/23/17 0526  NA 139 139  K 4.3 4.0  CL 103 100*  CO2 24 29  GLUCOSE 151* 75  BUN 61* 43*  CREATININE 3.55* 2.71*  CALCIUM 8.8* 8.7*  PHOS 2.6 3.1    Recent Labs  05/21/17 1400 05/23/17 0526  ALBUMIN 3.2* 3.0*    Recent Labs  05/21/17 0917 05/23/17 1600 05/24/17 0550  WBC 10.7* 6.0 9.6  NEUTROABS 8.0*  --   --   HGB 9.2* 8.3* 8.7*  HCT 28.5* 26.3* 27.8*  MCV 102.5* 101.9* 101.5*  PLT 187 165 168   Current IP Medications . amiodarone  200 mg Oral BID  . collagenase   Topical Daily  . darbepoetin (ARANESP) injection - DIALYSIS  60 mcg Intravenous Q Wed-HD  . diltiazem  180 mg Oral Daily  . famotidine  20 mg Oral QHS  . feeding supplement (PRO-STAT SUGAR FREE 64)  30 mL Oral BID  . hydrocerin   Topical BID  .  levothyroxine  100 mcg Oral QAC breakfast  . megestrol  40 mg Oral BID  . midodrine  10 mg Oral TID WC  . multivitamin  1 tablet Oral QHS  . QUEtiapine  25 mg Oral QHS  . senna-docusate  2 tablet Oral QHS  . sertraline  25 mg Oral QHS   Continuous Infusion Medications: None . heparin 800 Units/hr (05/24/17 0818)   PRN Medications:  acetaminophen, aluminum hydroxide, artificial tears, bisacodyl, diphenhydrAMINE, guaiFENesin-dextromethorphan, ipratropium-albuterol, ondansetron (ZOFRAN) IV, phenol, polyethylene glycol, prochlorperazine **OR** prochlorperazine **OR** prochlorperazine, zinc oxide  Assessment & Plan:   66yo female admitted to [facility] 04/08/17 with septic/cardiogenic shock with multiorgan failure including anuric AKI for which she was started on CRRT and transitioned to intermittent HD (MWF). She remains HD dependent producing small volumes of urine. She is now in physical rehabilitation (transferred here 05/08/17) (On record review creatinine 09/2014 0.62, 1.94 on presentation to [facility] 04/08/17 with LE cellulitis, anasarca, AKI, Serologic work up there complements normal, negative SPEP/UPEP, ANA negative, ANCA negative, GBM negative, negative hepatitis).  Was declared new ESRD prior to transfer w/eventual plan for outpt HD TTS at [facility] unit.  New issue during rehab vaginal bleeding + discharge with finding of thickened endometrium and a mass  1. 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 Uneventful HD yest. 1. Will need transition to TTS towards end of hospitalization 2. Vein mapping completed, consult VVS towards end of stay 3. [place] [facility]) outpatient   2. Hypotension - On Midodrine for continued pressure support. BP stable.  1. Continue Midodrine 10mg  tid 2. Anemia, Iron deficiency - On Aranesp Qwed, second week. S/p Fe replacement 3. Hb 8.7 and stable 4. Monitor 3. Hyperphosphatemia - on 8/9 phos was <1.0, 8/10 was 1.8 with  hopes that liberalized diet would improve however 1.1 8/11. Renvela d/c 8/11.  1. Phos 3.1  2. Continue to monitor closely off binders with daily renal function panels 4. A fib - on diltiazem, amiodarone; eliquis 5. Vaginal bleeding & discharge - pelvic US with thickened endometrial lining at 22 mm and a 3.7cm hyperechoic mass was noted. Primary dw GYN who recs outpatient biopsy. 1. Getting Megace 2. Wet prep noted WBC,gram stain, gon/chlamydia neg  Bethany Molt, DO PGY-2 Internal Medicine     I have seen and examined this patient and agree with plan and assessment in the above note with renal recommendations/intervention highlighted.  Doing well and was on MWF schedule but will get on TTS schedule this week since that will be her outpatient schedule in [place].  Governor Rooks Anyjah Roundtree,MD 05/24/2017 12:32 PM

## 2017-05-24 NOTE — Progress Notes (Signed)
Social Work Patient ID: [name], female   DOB: July 23, 1951, 66 y.o.   MRN: [id number]   Have reviewed team conference with pt and spouse.  Both aware that team has recommended later d/c date with new target of 8/30 and they are agreeable.  Pt very pleased as she admits she was feeling "anxious" about possible d/c next week.  Husband is hopeful that, with extension, pt will have more drive to be more independent and "not be so passive."  Have alerted planned OP HD center of the targeted d/c date as well.  Continue to follow.  Elizjah Noblet, LCSW

## 2017-05-24 NOTE — Progress Notes (Signed)
ANTICOAGULATION CONSULT NOTE - Follow Up Consult  Pharmacy Consult for heparin Indication: atrial fibrillation  No Known Allergies  Patient Measurements: Height: 5\' 5"  (165.1 cm) Weight: 138 lb 14.2 oz (63 kg) IBW/kg (Calculated) : 57 Heparin Dosing Weight: 63 kg  Vital Signs: Temp: 98.9 F (37.2 C) (08/16 1415) Temp Source: Oral (08/16 1415) BP: 117/67 (08/16 1415) Pulse Rate: 113 (08/16 1415)  Labs:  Recent Labs  05/23/17 0526 05/23/17 1032 05/23/17 1600 05/24/17 0550 05/24/17 1524  HGB  --   --  8.3* 8.7*  --   HCT  --   --  26.3* 27.8*  --   PLT  --   --  165 168  --   APTT  --  37*  --  197* 155*  HEPARINUNFRC  --  >2.20*  --  >2.20*  --   CREATININE 2.71*  --   --   --   --     Estimated Creatinine Clearance: 18.6 mL/min (A) (by C-G formula based on SCr of 2.71 mg/dL (H)).   Medications:  Scheduled:  . amiodarone  200 mg Oral BID  . collagenase   Topical Daily  . darbepoetin (ARANESP) injection - DIALYSIS  60 mcg Intravenous Q Wed-HD  . diltiazem  180 mg Oral Daily  . famotidine  20 mg Oral QHS  . feeding supplement (PRO-STAT SUGAR FREE 64)  30 mL Oral BID  . hydrocerin   Topical BID  . levothyroxine  100 mcg Oral QAC breakfast  . megestrol  40 mg Oral BID  . midodrine  10 mg Oral TID WC  . multivitamin  1 tablet Oral QHS  . QUEtiapine  25 mg Oral QHS  . senna-docusate  2 tablet Oral QHS  . sertraline  25 mg Oral QHS   Infusions:  . heparin 800 Units/hr (05/24/17 0818)    Assessment: 66 yo F on Eliquis for afib.  Transitioned to heparin in anticipation of wound debridement in OR later this week/next week.  Heparin levels are erroneously influenced by recent Eliquis use and therefore adjusting heparin rate using aPTTs.  Unfortunately pt has restricted access on one arm so lab was drawn close to IV site where heparin is infusing though it was drawn downstream.  Goal of Therapy:  aPTT 66-102 seconds Monitor platelets by anticoagulation protocol:  Yes   Plan:  Decrease heparin to 600 units/hr.  Next aPTT in 8 hours.  [employer], Pharm.D., BCPS Clinical Pharmacist

## 2017-05-24 NOTE — Progress Notes (Signed)
Occupational Therapy Session Note  Patient Details  Name: [name] MRN: [id number] Date of Birth: [birth year]  Today's Date: 05/24/2017 OT Individual Time: 1300-1356 OT Individual Time Calculation (min): 56 min    Short Term Goals: Week 3:  OT Short Term Goal 1 (Week 3): Pt will demonstrate ability to pull pants over hips at sit > stand level with mod assist for standing balance OT Short Term Goal 2 (Week 3): Pt will complete hygiene post toileting with mod assist for standing balance with use of AE PRN OT Short Term Goal 3 (Week 3): Pt will complete toilet transfers with min assist  Skilled Therapeutic Interventions/Progress Updates:    Treatment session performed at bed level per verbal order per PA due to elevated PTT.  Pt performed chest presses and bicep curls with 1# and 2# dowel rods respectively with focus on increased shoulder flexion and clearance of elbows from bed level.  3 sets of 10 each.  Engaged in d/c planning with discussion regarding recommendation for ramp and tub bench for home.  Provided pt with home measurement sheet and encouraged pt's husband to fill out to increase preparation for d/c home and to further assess need for DME.  Therapy Documentation Precautions:  Precautions Precautions: Fall Restrictions Weight Bearing Restrictions: No General:   Vital Signs: Therapy Vitals Temp: 98.9 F (37.2 C) Temp Source: Oral Pulse Rate: (!) 113 Resp: 18 BP: 117/67 Pain:  Pt with no c/o pain  See Function Navigator for Current Functional Status.   Therapy/Group: Individual Therapy  [name] 05/24/2017, 3:26 PM

## 2017-05-25 ENCOUNTER — Inpatient Hospital Stay (HOSPITAL_COMMUNITY): Payer: Medicare Other | Admitting: Physical Therapy

## 2017-05-25 ENCOUNTER — Encounter (HOSPITAL_COMMUNITY): Payer: Self-pay

## 2017-05-25 ENCOUNTER — Inpatient Hospital Stay (HOSPITAL_COMMUNITY): Payer: Medicare Other | Admitting: Occupational Therapy

## 2017-05-25 ENCOUNTER — Inpatient Hospital Stay (HOSPITAL_COMMUNITY): Payer: Medicare Other

## 2017-05-25 LAB — RENAL FUNCTION PANEL
ANION GAP: 14 (ref 5–15)
Albumin: 3.3 g/dL — ABNORMAL LOW (ref 3.5–5.0)
BUN: 37 mg/dL — ABNORMAL HIGH (ref 6–20)
CALCIUM: 9 mg/dL (ref 8.9–10.3)
CHLORIDE: 98 mmol/L — AB (ref 101–111)
CO2: 23 mmol/L (ref 22–32)
CREATININE: 3.2 mg/dL — AB (ref 0.44–1.00)
GFR calc non Af Amer: 14 mL/min — ABNORMAL LOW (ref >60)
GFR, EST AFRICAN AMERICAN: 16 mL/min — AB (ref >60)
Glucose, Bld: 101 mg/dL — ABNORMAL HIGH (ref 65–99)
Phosphorus: 2.6 mg/dL (ref 2.5–4.6)
Potassium: 4 mmol/L (ref 3.5–5.1)
SODIUM: 135 mmol/L (ref 135–145)

## 2017-05-25 LAB — CBC
HEMATOCRIT: 27.3 % — AB (ref 36.0–46.0)
HEMOGLOBIN: 8.7 g/dL — AB (ref 12.0–15.0)
MCH: 32.6 pg (ref 26.0–34.0)
MCHC: 31.9 g/dL (ref 30.0–36.0)
MCV: 102.2 fL — ABNORMAL HIGH (ref 78.0–100.0)
Platelets: 183 10*3/uL (ref 150–400)
RBC: 2.67 MIL/uL — AB (ref 3.87–5.11)
RDW: 23.5 % — ABNORMAL HIGH (ref 11.5–15.5)
WBC: 10.1 10*3/uL (ref 4.0–10.5)

## 2017-05-25 LAB — APTT
APTT: 87 s — AB (ref 24–36)
APTT: 65 s — AB (ref 24–36)

## 2017-05-25 LAB — GLUCOSE, CAPILLARY
GLUCOSE-CAPILLARY: 102 mg/dL — AB (ref 65–99)
GLUCOSE-CAPILLARY: 126 mg/dL — AB (ref 65–99)
Glucose-Capillary: 88 mg/dL (ref 65–99)
Glucose-Capillary: 131 mg/dL — ABNORMAL HIGH (ref 65–99)

## 2017-05-25 LAB — HEPARIN LEVEL (UNFRACTIONATED): Heparin Unfractionated: >2.2 IU/mL — ABNORMAL HIGH (ref 0.30–0.70)

## 2017-05-25 NOTE — Progress Notes (Signed)
Occupational Therapy Session Note  Patient Details  Name: [name] MRN: [id number] Date of Birth: 1951-01-09  Today's Date: 05/25/2017 OT Individual Time: 4163-8453 and 6468-0321 OT Individual Time Calculation (min): 56 min and 57 min   Skilled Therapeutic Interventions/Progress Updates:    1:1. Pt completes supine>sitting EOB with MIN A for trunk elevation. Pt slide board transfer EOB<>BSC<>w/c<>EOM level surfaces with A to place board and intermittant A for lifting to scoot across board. For transfers EOB>BSC 6" board placed under feet for support while completing transfer with VC for pt to push through feet to assist scooting. Pt has continent bowel movement on BSC. Pt unable to reach to complete posterior hygiene and OT provided total A for hygiene Pt sit to stand with MOD A for lifting and balance as +2 for completing clothing management. In tx gym, pt plays game of connect four and uses graded clothes pins at shoulder level to improve strength and ROM in BUE required for BADLs. Pt required min assist at elbow to complete shoulder flexion ROM necessary for activity (about 90 degrees). Exited session with pt seated in w/c with call light in reach and all needs met.   Session 2: 1:1. Pt slide board transfers w/c<>EOB>EOB with min A for balance and placement/removal of board. Pt sits EOM to play Wii bowling to improve ROM/strength required for ADLs. Pt required max VC for wii remote use. Pt completes 3 sit to stands with MOD A for lifting and tactile/verbal cues for hip extension, posture and weight shifting. Exited session with pt semi reclined in bed with call light in reach and all needs met.  Therapy Documentation Precautions:  Precautions Precautions: Fall Restrictions Weight Bearing Restrictions: No  See Function Navigator for Current Functional Status.   Therapy/Group: Individual Therapy  Tonny Branch 05/25/2017, 9:58 AM

## 2017-05-25 NOTE — Progress Notes (Signed)
Physical Therapy Note  Patient Details  Name: Kathrynn Backstrom MRN: [id number] Date of Birth: Mar 13, 1951 Today's Date: 05/25/2017    Time: 1030-1126 56 minutes  1:1 No c/o pain.  Sliding board transfers with min A.  Sit to stand repetitions for strengthening from elevated surface with mod/max A with RW, cues for UE placement each attempt.  Gait 11' x 2, 13' with RW with mod A, +2 for safety and chair follow.  UBE for UE strength and endurance 2 min fwd/ 1 min bkwd x 2 repetitions.  PT spoke with pt/husband and confirmed their plan to rent a ramp for use at home.   Macy Lingenfelter 05/25/2017, 11:27 AM

## 2017-05-25 NOTE — Progress Notes (Signed)
[facility] Rounding Note  SUBJECTIVE: Feels well, no complaints. Working with PT.   Interval events:   To have HD tomorrow. Producing urine yet still none recorded. PLEASE RECORD UOP.  Plastics to debride LLE wounds on Monday 8/20.  New DC tentative date 8/30.    Intake/Output Summary (Last 24 hours) at 05/25/17 0843 Last data filed at 05/24/17 1922  Gross per 24 hour  Intake              948 ml  Output                0 ml  Net              948 ml   Filed Weights   05/23/17 1526 05/24/17 0600 05/25/17 0456  Weight: 116 lb 13.5 oz (53 kg) 138 lb 14.2 oz (63 kg) 139 lb 15.9 oz (63.5 kg)   OBJECTIVE Physical Exam   VITALS: Blood pressure (!) 103/58, pulse (!) 122, temperature 98.9 F (37.2 C), temperature source Oral, resp. rate 18, height 5\' 5"  (1.651 m), weight 138 lb 14.2 oz (63 kg), SpO2 100 %. GEN: Alert and oriented, pleasant and conversant. Right IJ permacath (placed 04/26/17) CV: IRIR, Diastolic murmur PULM: CTAB, able to speak in complete sentences. Normal WOB on RA.  ABD: Soft, nontender. Not distended. SKIN: No rash. LLE with clean dry bandages.  EXT: Minimal LE edema.  Basic Metabolic Panel:  Recent Labs  05/23/17 0526  NA 139  K 4.0  CL 100*  CO2 29  GLUCOSE 75  BUN 43*  CREATININE 2.71*  CALCIUM 8.7*  PHOS 3.1    Recent Labs  05/23/17 0526  ALBUMIN 3.0*    Recent Labs  05/24/17 0550 05/25/17 0248  WBC 9.6 10.1  HGB 8.7* 8.7*  HCT 27.8* 27.3*  MCV 101.5* 102.2*  PLT 168 183   Current IP Medications . amiodarone  200 mg Oral BID  . collagenase   Topical Daily  . darbepoetin (ARANESP) injection - DIALYSIS  60 mcg Intravenous Q Wed-HD  . diltiazem  180 mg Oral Daily  . famotidine  20 mg Oral QHS  . feeding supplement (PRO-STAT SUGAR FREE 64)  30 mL Oral BID  . hydrocerin   Topical BID  . levothyroxine  100 mcg Oral QAC breakfast  . megestrol  40 mg Oral BID  . midodrine  10 mg Oral TID WC  . multivitamin  1 tablet Oral QHS  . QUEtiapine  25  mg Oral QHS  . senna-docusate  2 tablet Oral QHS  . sertraline  25 mg Oral QHS   Continuous Infusion Medications: None . heparin 600 Units/hr (05/24/17 1810)   PRN Medications:  acetaminophen, aluminum hydroxide, artificial tears, bisacodyl, diphenhydrAMINE, guaiFENesin-dextromethorphan, ipratropium-albuterol, ondansetron (ZOFRAN) IV, phenol, polyethylene glycol, prochlorperazine **OR** prochlorperazine **OR** prochlorperazine, zinc oxide  Assessment & Plan:   66yo female admitted to [facility] 04/08/17 with septic/cardiogenic shock with multiorgan failure including anuric AKI for which she was started on CRRT and transitioned to intermittent HD (MWF). She remains HD dependent producing small volumes of urine. She is now in physical rehabilitation (transferred here 05/08/17) (On record review creatinine 09/2014 0.62, 1.94 on presentation to [facility] 04/08/17 with LE cellulitis, anasarca, AKI, Serologic work up there complements normal, negative SPEP/UPEP, ANA negative, ANCA negative, GBM negative, negative hepatitis). Was declared new ESRD prior to transfer w/eventual plan for outpt HD TTS at [facility] unit.  New issue during rehab vaginal bleeding + discharge with finding of thickened endometrium  and a mass  1. 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   1. PLEASE RECORD UOP.   2. HD tomorrow to transition to TTS schedule (Patient hesitant about Sat HD however no MWF spots open at her unit)  3. Vein mapping is completed, VVS will need to be consulted towards end of stay  4. Outpatient HD arranged in [place] [facility] TTS.  1. Hypotension - On Midodrine for continued pressure support. BP stable.  1. Continue Midodrine 10mg  tid 2. Anemia, Iron deficiency - On Aranesp Qwed, second week. S/p Fe replacement 3. Hb 8.7 and stable 4. Monitor 2. Hyperphosphatemia - on 8/9 phos was <1.0, 8/10 was 1.8 with hopes that liberalized diet would improve however  1.1 8/11. Renvela d/c 8/11.  1. Phos improved - phos 2.6 yesterday. 3. A fib - on diltiazem, amiodarone; eliquis 4. Vaginal bleeding & discharge - pelvic US with thickened endometrial lining at 22 mm and a 3.7cm hyperechoic mass was noted. Primary dw GYN who recs outpatient biopsy. 1. Getting Megace 2. Wet prep noted WBC,gram stain, gon/chlamydia neg  Bethany Molt, DO PGY-2 Internal Medicine    I have seen and examined this patient and agree with plan and assessment in the above note with renal recommendations/intervention highlighted.  Will get her on her outpatient schedule tomorrow. Broadus John A Nic Lampe,MD 05/25/2017 4:40 PM

## 2017-05-25 NOTE — Plan of Care (Signed)
Problem: RH BOWEL ELIMINATION Goal: RH STG MANAGE BOWEL W/MEDICATION W/ASSISTANCE STG Manage Bowel with Medication with min Assistance.  Outcome: Progressing Last BM 05/24/2017  Problem: RH SKIN INTEGRITY Goal: RH STG SKIN FREE OF INFECTION/BREAKDOWN Patients skin will remain free from further infection or breakdown with mod assist.  Outcome: Progressing No signs of infection noted

## 2017-05-25 NOTE — Progress Notes (Signed)
ANTICOAGULATION CONSULT NOTE - Follow Up Consult  Pharmacy Consult for Heparin (apixaban on hold) Indication: atrial fibrillation  No Known Allergies  Patient Measurements: Height: 5\' 5"  (165.1 cm) Weight: 138 lb 14.2 oz (63 kg) IBW/kg (Calculated) : 57  Vital Signs:    Labs:  Recent Labs  05/23/17 0526  05/23/17 1032  05/23/17 1600 05/24/17 0550 05/24/17 1524 05/25/17 0248  HGB  --   --   --   < > 8.3* 8.7*  --  8.7*  HCT  --   --   --   --  26.3* 27.8*  --  27.3*  PLT  --   --   --   --  165 168  --  183  APTT  --   < > 37*  --   --  197* 155* 87*  HEPARINUNFRC  --   --  >2.20*  --   --  >2.20*  --  >2.20*  CREATININE 2.71*  --   --   --   --   --   --   --   < > = values in this interval not displayed.  Estimated Creatinine Clearance: 18.6 mL/min (A) (by C-G formula based on SCr of 2.71 mg/dL (H)).   Assessment: 66 y/o F on heparin drip while apixaban is on hold in anticipation of OR. APTT is therapeutic at 87 after rate decrease. Using aPTT to dose heparin for now given apixaban influence on anti-Xa levels.   Goal of Therapy:  Heparin level 0.3-0.7 units/ml aPTT 66-102 seconds Monitor platelets by anticoagulation protocol: Yes   Plan:  -Cont heparin at 600 units/hr -1200 aPTT  Narda Bonds 05/25/2017,4:02 AM

## 2017-05-25 NOTE — Progress Notes (Signed)
Occupational Therapy Session Note  Patient Details  Name: [name] MRN: [id number] Date of Birth: November 22, 1950  Today's Date: 05/25/2017 OT Individual Time: 0730-0800 OT Individual Time Calculation (min): 30 min    Short Term Goals: Week 3:  OT Short Term Goal 1 (Week 3): Pt will demonstrate ability to pull pants over hips at sit > stand level with mod assist for standing balance OT Short Term Goal 2 (Week 3): Pt will complete hygiene post toileting with mod assist for standing balance with use of AE PRN OT Short Term Goal 3 (Week 3): Pt will complete toilet transfers with min assist  Skilled Therapeutic Interventions/Progress Updates:    Treatment session focused on ADl/self care training, bed mobility, pt education, and energy conservation techniques. Pt supine in bed with HOB elevated and agreeable to sitting EOB for grooming and UB d/b. Pt transferred from supine lying to EOB with mod A for LB guiding to sit EOB with v/c for hand/foot placement. Pt tolerated sitting upright without support for completing UB b/d and grooming with set up assist. Pt transferred from EOB sit to supine lying per request after short AM session to rest prior to next therapy. No c/o pain. Noted weakness in B UE. Pt left resting comfortably with needs met.   Therapy Documentation Precautions:  Precautions Precautions: Fall Restrictions Weight Bearing Restrictions: No Pain: Pain Assessment Pain Assessment: No/denies pain  See Function Navigator for Current Functional Status.   Therapy/Group: Individual Therapy  [name] 05/25/2017, 12:11 PM

## 2017-05-25 NOTE — Progress Notes (Signed)
ANTICOAGULATION CONSULT NOTE - Follow Up Consult  Pharmacy Consult for heparin Indication: atrial fibrillation  No Known Allergies  Patient Measurements: Height: 5\' 5"  (165.1 cm) Weight: 139 lb 15.9 oz (63.5 kg) IBW/kg (Calculated) : 57 Heparin Dosing Weight: 63 kg  Vital Signs: Temp: 98.3 F (36.8 C) (08/17 0456) Temp Source: Oral (08/17 0456) BP: 117/76 (08/17 0456) Pulse Rate: 125 (08/17 0456)  Labs:  Recent Labs  05/23/17 0526  05/23/17 1032  05/23/17 1600 05/24/17 0550 05/24/17 1524 05/25/17 0248 05/25/17 1127  HGB  --   --   --   < > 8.3* 8.7*  --  8.7*  --   HCT  --   --   --   --  26.3* 27.8*  --  27.3*  --   PLT  --   --   --   --  165 168  --  183  --   APTT  --   < > 37*  --   --  197* 155* 87* 65*  HEPARINUNFRC  --   --  >2.20*  --   --  >2.20*  --  >2.20*  --   CREATININE 2.71*  --   --   --   --   --   --   --  3.20*  < > = values in this interval not displayed.  Estimated Creatinine Clearance: 15.8 mL/min (A) (by C-G formula based on SCr of 3.2 mg/dL (H)).   Medications:  Scheduled:  . amiodarone  200 mg Oral BID  . collagenase   Topical Daily  . darbepoetin (ARANESP) injection - DIALYSIS  60 mcg Intravenous Q Wed-HD  . diltiazem  180 mg Oral Daily  . famotidine  20 mg Oral QHS  . feeding supplement (PRO-STAT SUGAR FREE 64)  30 mL Oral BID  . hydrocerin   Topical BID  . levothyroxine  100 mcg Oral QAC breakfast  . megestrol  40 mg Oral BID  . midodrine  10 mg Oral TID WC  . multivitamin  1 tablet Oral QHS  . QUEtiapine  25 mg Oral QHS  . senna-docusate  2 tablet Oral QHS  . sertraline  25 mg Oral QHS   Infusions:  . heparin 600 Units/hr (05/24/17 1810)    Assessment: 66 yo F on Eliquis for afib.  Transitioned to heparin in anticipation of wound debridement in OR 8/20.  Heparin levels are erroneously influenced by recent Eliquis use and therefore adjusting heparin rate using aPTTs.  APTT 65 on 600 units/hr, CBC stable, No bleeding noted  per chart.  Goal of Therapy:  aPTT 66-102 seconds Monitor platelets by anticoagulation protocol: Yes   Plan:  Increase heparin slightly to 650 units/hr.   F/u AM labs  Maryanna Shape, PharmD, BCPS  Clinical Pharmacist  Pager: [phone]

## 2017-05-25 NOTE — Progress Notes (Signed)
[facility] PHYSICAL MEDICINE & REHABILITATION     PROGRESS NOTE  Subjective/Complaints:  Pt seen laying in bed this Am.  She slept well overnight.  She notes that she was told by the surgeon that she would be going to the [facility] or Tues.   ROS: Denies nausea, vomiting, diarrhea, shortness of breath or chest pain   Objective: Vital Signs: Blood pressure 117/76, pulse (!) 125, temperature 98.3 F (36.8 C), temperature source Oral, resp. rate 16, height 5\' 5"  (1.651 m), weight 63.5 kg (139 lb 15.9 oz), SpO2 99 %. No results found.  Recent Labs  05/24/17 0550 05/25/17 0248  WBC 9.6 10.1  HGB 8.7* 8.7*  HCT 27.8* 27.3*  PLT 168 183    Recent Labs  05/23/17 0526  NA 139  K 4.0  CL 100*  GLUCOSE 75  BUN 43*  CREATININE 2.71*  CALCIUM 8.7*   CBG (last 3)   Recent Labs  05/24/17 1657 05/24/17 2058 05/25/17 0626  GLUCAP 96 134* 88    Wt Readings from Last 3 Encounters:  05/25/17 63.5 kg (139 lb 15.9 oz)  05/07/17 75.3 kg (166 lb 0.1 oz)    Physical Exam:  BP 117/76 (BP Location: Right Arm)   Pulse (!) 125   Temp 98.3 F (36.8 C) (Oral)   Resp 16   Ht 5\' 5"  (1.651 m)   Wt 63.5 kg (139 lb 15.9 oz)   SpO2 99%   BMI 23.30 kg/m  Constitutional: She appears well-developed and well-nourished.  HENT: Normocephalic. Atraumatic. Eyes: EOMI. No discharge.  Cardiovascular: IRIR Respiratory: CTA Bilaterally. Normal. effort.  GI: Soft. Bowel sounds are normal.   Musculoskeletal: She exhibits edema and tenderness.  Neurological: She is alert and oriented.  Able to follow commands without difficulty.  Motor: B/l UE 4/5 proximal to distal RLE: HF 3/5, KE 3+/5, ADF/PF 4/5  LLE: HF 3-/5, KE 3/5, ADF/PF 3+/5 (stable) Skin: LLE with ulcerations with eschar on dorsum of foot and distal tibia with dressing c/d/i.  Psychiatric: She has a normal mood and affect. Her behavior is normal. Thought content normal.    Assessment/Plan: 1. 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 Physiatrist is providing close team supervision and 24 hour management of active medical problems listed below. Physiatrist and rehab team continue to assess barriers to discharge/monitor patient progress toward functional and medical goals.  Function:  Bathing Bathing position   Position: Bed  Bathing parts Body parts bathed by patient: Right arm, Left arm, Chest, Abdomen, Right upper leg, Left upper leg, Front perineal area, Right lower leg Body parts bathed by helper: Buttocks, Left lower leg, Back  Bathing assist Assist Level: Touching or steadying assistance(Pt > 75%)      Upper Body Dressing/Undressing Upper body dressing   What is the patient wearing?: Pull over shirt/dress     Pull over shirt/dress - Perfomed by patient: Put head through opening, Thread/unthread left sleeve, Thread/unthread right sleeve, Pull shirt over trunk Pull over shirt/dress - Perfomed by helper: Pull shirt over trunk        Upper body assist Assist Level: Set up   Set up : To obtain clothing/put away  Lower Body Dressing/Undressing Lower body dressing   What is the patient wearing?: Pants, Non-skid slipper socks     Pants- Performed by patient: Thread/unthread right pants leg, Thread/unthread left pants leg Pants- Performed by helper: Pull pants up/down Non-skid  slipper socks- Performed by patient: Don/doff right sock Non-skid slipper socks- Performed by helper: Don/doff right sock                  Lower body assist Assist for lower body dressing: Touching or steadying assistance (Pt > 75%)      Toileting Toileting Toileting activity did not occur: N/A (anuric, no bm, no toileting performed)   Toileting steps completed by helper: Adjust clothing prior to toileting, Performs perineal hygiene, Adjust clothing after toileting    Toileting assist Assist level: Two helpers    Transfers Chair/bed transfer   Chair/bed transfer method: Lateral scoot Chair/bed transfer assist level: Moderate assist (Pt 50 - 74%/lift or lower) Chair/bed transfer assistive device: Armrests, Sliding board Mechanical lift: Stedy   Locomotion Ambulation Ambulation activity did not occur: Safety/medical concerns   Max distance: 61' Assist level: 2 helpers   Wheelchair   Type: Manual Max wheelchair distance: 10' Assist Level: Touching or steadying assistance (Pt > 75%)  Cognition Comprehension Comprehension assist level: Follows basic conversation/direction with no assist  Expression Expression assist level: Expresses basic needs/ideas: With no assist  Social Interaction Social Interaction assist level: Interacts appropriately with others - No medications needed.  Problem Solving Problem solving assist level: Solves basic 90% of the time/requires cueing < 10% of the time  Memory Memory assist level: Recognizes or recalls 90% of the time/requires cueing < 10% of the time    Medical Problem List and Plan: 1.  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  DVT Prophylaxis/Anticoagulation: Pharmaceutical: Other (comment)--Eliquis on hold, heparin ggt for surgery  Aptt remains elevated. Appreciate pharmacy assistance. 3. Pain Management: tylenol prn 4. Mood: LCSW to follow for evaluation and support.  5. Neuropsych: This patient is capable of making decisions on her own behalf. 6. Skin/Wound Care: routine pressure relief measures. Prevalon boot for heel ulcer.  7. Fluids/Electrolytes/Nutrition: Strict I/Os.   8.  Hypotension: Monitor orthostatic BP. Added abdominal binder and TEDs to help with BP support.    Vitals:   05/24/17 1415 05/25/17 0456  BP: 117/67 117/76  Pulse: (!) 113 (!) 125  Resp: 18 16  Temp: 98.9 F (37.2 C) 98.3 F (36.8 C)  SpO2:  99%   Controlled 8/17 9. Paroxysmal Afib:  on eliquis for stroke prevention. Continue  amiodarone bid   Cardizem increased to 180 on 8/16.   Cont to monitor 10. Leukocytosis: Resolved              Patient with ulcers--continue with WOC recs/daily dressing             Afebrile             Cont to monitor 11. Anemia of chronic disease: On epogen  MWF.              Hb 8.7 on 8/17             Cont to montior 12. Left foot/leg ulcer: Daily dressing changes with santyl. Monitor for signs of infection.   Debrided 8/6, again 8/13  PRS for debridement in OR early next week per pt.  13. Anxiety/depression: On Seroquel and Zoloft at nights.  14. Prediabetes: CBG (last 3)   Recent Labs  05/24/17 1657 05/24/17 2058 05/25/17 0626  GLUCAP 96 134* 88   Relatively controlled 8/17 15. CKD now HD dependent: HD on MWF at end of day to help with tolerance of therapy.  Renal diet--Reducate patient on appropriate diet.  16. Vaginal bleeding  Per PCP no issues prior  Gyn consulted, Megace initiated, transvaginal U/S showing endometrial thickening and mass.  Contacted Gyn, per recs, outpt workup to rule out CA.  Improved at present 17. Abnormal UA  UA +, Ucx with multiple species 18. Vaginal discharge  Wet prep with many WBCs  Improved at present  LOS (Days) 18 A FACE TO FACE EVALUATION WAS PERFORMED  Meilin Brosh Lorie Phenix 05/25/2017 8:44 AM

## 2017-05-26 ENCOUNTER — Inpatient Hospital Stay (HOSPITAL_COMMUNITY): Payer: Medicare Other | Admitting: Occupational Therapy

## 2017-05-26 LAB — CBC
HEMATOCRIT: 27 % — AB (ref 36.0–46.0)
HEMOGLOBIN: 8.5 g/dL — AB (ref 12.0–15.0)
MCH: 32.4 pg (ref 26.0–34.0)
MCHC: 31.5 g/dL (ref 30.0–36.0)
MCV: 103.1 fL — ABNORMAL HIGH (ref 78.0–100.0)
Platelets: 186 10*3/uL (ref 150–400)
RBC: 2.62 MIL/uL — ABNORMAL LOW (ref 3.87–5.11)
RDW: 23.8 % — ABNORMAL HIGH (ref 11.5–15.5)
WBC: 9.9 10*3/uL (ref 4.0–10.5)

## 2017-05-26 LAB — GLUCOSE, CAPILLARY
GLUCOSE-CAPILLARY: 94 mg/dL (ref 65–99)
GLUCOSE-CAPILLARY: 76 mg/dL (ref 65–99)
GLUCOSE-CAPILLARY: 107 mg/dL — AB (ref 65–99)
Glucose-Capillary: 112 mg/dL — ABNORMAL HIGH (ref 65–99)

## 2017-05-26 LAB — HEPARIN LEVEL (UNFRACTIONATED): Heparin Unfractionated: 1.78 IU/mL — ABNORMAL HIGH (ref 0.30–0.70)

## 2017-05-26 LAB — APTT: APTT: 71 s — AB (ref 24–36)

## 2017-05-26 MED ORDER — HEPARIN SODIUM (PORCINE) 1000 UNIT/ML DIALYSIS: 20.0000 [IU]/kg | INTRAMUSCULAR | Status: DC | PRN

## 2017-05-26 MED ORDER — DARBEPOETIN ALFA 60 MCG/0.3ML IJ SOSY
PREFILLED_SYRINGE | INTRAMUSCULAR | Status: AC
  Administered 2017-05-26: 60 ug via INTRAVENOUS
  Filled 2017-05-26: qty 0.3

## 2017-05-26 NOTE — Procedures (Signed)
I was present at this dialysis session. I have reviewed the session itself and made appropriate changes.   Filed Weights   05/25/17 0456 05/26/17 0441 05/26/17 1318  Weight: 63.5 kg (139 lb 15.9 oz) 63.2 kg (139 lb 6.4 oz) 63.3 kg (139 lb 8.8 oz)     Recent Labs Lab 05/25/17 1127  NA 135  K 4.0  CL 98*  CO2 23  GLUCOSE 101*  BUN 37*  CREATININE 3.20*  CALCIUM 9.0  PHOS 2.6     Recent Labs Lab 05/21/17 0917  05/24/17 0550 05/25/17 0248 05/26/17 0506  WBC 10.7*  < > 9.6 10.1 9.9  NEUTROABS 8.0*  --   --   --   --   HGB 9.2*  < > 8.7* 8.7* 8.5*  HCT 28.5*  < > 27.8* 27.3* 27.0*  MCV 102.5*  < > 101.5* 102.2* 103.1*  PLT 187  < > 168 183 186  < > = values in this interval not displayed.  Scheduled Meds: . amiodarone  200 mg Oral BID  . collagenase   Topical Daily  . darbepoetin (ARANESP) injection - DIALYSIS  60 mcg Intravenous Q Wed-HD  . diltiazem  180 mg Oral Daily  . famotidine  20 mg Oral QHS  . feeding supplement (PRO-STAT SUGAR FREE 64)  30 mL Oral BID  . hydrocerin   Topical BID  . levothyroxine  100 mcg Oral QAC breakfast  . megestrol  40 mg Oral BID  . midodrine  10 mg Oral TID WC  . multivitamin  1 tablet Oral QHS  . QUEtiapine  25 mg Oral QHS  . senna-docusate  2 tablet Oral QHS  . sertraline  25 mg Oral QHS   Continuous Infusions: . heparin 650 Units/hr (05/25/17 1946)   PRN Meds:.acetaminophen, aluminum hydroxide, artificial tears, bisacodyl, diphenhydrAMINE, guaiFENesin-dextromethorphan, heparin, ipratropium-albuterol, ondansetron (ZOFRAN) IV, phenol, polyethylene glycol, prochlorperazine **OR** prochlorperazine **OR** prochlorperazine, zinc oxide   Donetta Potts,  MD 05/26/2017, 2:04 PM

## 2017-05-26 NOTE — Progress Notes (Signed)
Occupational Therapy Session Note  Patient Details  Name: [name] MRN: [id number] Date of Birth: [birth year]  Today's Date: 05/26/2017 OT Individual Time: 1219-7588 OT Individual Time Calculation (min): 50 min   Skilled Therapeutic Interventions/Progress Updates:    Tx focus on dynamic balance and adaptive bathing/dressing skills during self care completion.   Pt greeted supine in bed, requesting to complete B/D EOB and bedlevel. Pt requiring Max A for supine<sit. She completed UB bathing/dressing with Min A and supervision respectively. Oral care with setup. Educated her on utilizing lateral leans for perihygiene, which she did with min guard. Pt returning to supine and then transitioned to supported circle sitting to wash LEs and thread LEs into pants. Pt able to thread Rt leg into pants with much effort and (Mod-Max A) posterior balance support from therapist or HOB. She required assist for threading Lt leg and for lifting pants over hips while rolling R>L with Mod A (pt declining to try bridging for clothing mgt due to fatigue at this point). OT placed Rt LE for facilitating pt boosting herself up in bed with use of bedrails/headboard for UB strengthening. At end of tx pt was left with spouse and all needs within reach.    Therapy Documentation Precautions:  Precautions Precautions: Fall Restrictions Weight Bearing Restrictions: No General: General OT Amount of Missed Time: 10 Minutes (due to dialysis at 1pm. Could not return to make up missed time)   Pain: No c/o pain during tx    ADL:      See Function Navigator for Current Functional Status.   Therapy/Group: Individual Therapy  Marliss Buttacavoli A Magie Ciampa 05/26/2017, 12:38 PM

## 2017-05-26 NOTE — Progress Notes (Signed)
ANTICOAGULATION CONSULT NOTE - Follow Up Consult  Pharmacy Consult for heparin Indication: atrial fibrillation  No Known Allergies  Patient Measurements: Height: 5\' 5"  (165.1 cm) Weight: 139 lb 6.4 oz (63.2 kg) IBW/kg (Calculated) : 57 Heparin Dosing Weight: 63 kg  Vital Signs: Temp: 99.9 F (37.7 C) (08/18 0441) Temp Source: Oral (08/18 0441) BP: 115/82 (08/18 0441) Pulse Rate: 105 (08/18 0558)  Labs:  Recent Labs  05/24/17 0550  05/25/17 0248 05/25/17 1127 05/26/17 0506  HGB 8.7*  --  8.7*  --  8.5*  HCT 27.8*  --  27.3*  --  27.0*  PLT 168  --  183  --  186  APTT 197*  < > 87* 65* 71*  HEPARINUNFRC >2.20*  --  >2.20*  --  1.78*  CREATININE  --   --   --  3.20*  --   < > = values in this interval not displayed.  Estimated Creatinine Clearance: 15.8 mL/min (A) (by C-G formula based on SCr of 3.2 mg/dL (H)).   Medications:  Scheduled:  . amiodarone  200 mg Oral BID  . collagenase   Topical Daily  . darbepoetin (ARANESP) injection - DIALYSIS  60 mcg Intravenous Q Wed-HD  . diltiazem  180 mg Oral Daily  . famotidine  20 mg Oral QHS  . feeding supplement (PRO-STAT SUGAR FREE 64)  30 mL Oral BID  . hydrocerin   Topical BID  . levothyroxine  100 mcg Oral QAC breakfast  . megestrol  40 mg Oral BID  . midodrine  10 mg Oral TID WC  . multivitamin  1 tablet Oral QHS  . QUEtiapine  25 mg Oral QHS  . senna-docusate  2 tablet Oral QHS  . sertraline  25 mg Oral QHS   Infusions:  . heparin 650 Units/hr (05/25/17 1946)    Assessment: 66 yo F on Eliquis for afib. Transitioned to heparin in anticipation of wound debridement in OR 8/20. Heparin levels are erroneously influenced by recent Eliquis use and therefore adjusting heparin rate using aPTTs. aPTT within goal at 71 after rate increase, CBC stable. No bleeding noted.  Goal of Therapy:  aPTT 66-102 seconds Monitor platelets by anticoagulation protocol: Yes   Plan:  Continue heparin gtt at 650 units/hr   Daily  heparin level, aPTT, and CBC Monitor for s/sx bleeding   Erin N. Gerarda Fraction, PharmD PGY1 Pharmacy Resident Pager: [phone]

## 2017-05-26 NOTE — Progress Notes (Signed)
[facility] PHYSICAL MEDICINE & REHABILITATION     PROGRESS NOTE  Subjective/Complaints:  No new complaints. Left foot was just dressed. Able to sleep last night. For HD today  ROS: pt denies nausea, vomiting, diarrhea, cough, shortness of breath or chest pain   Objective: Vital Signs: Blood pressure 115/82, pulse (!) 105, temperature 99.9 F (37.7 C), temperature source Oral, resp. rate 15, height 5\' 5"  (1.651 m), weight 63.2 kg (139 lb 6.4 oz), SpO2 95 %. No results found.  Recent Labs  05/25/17 0248 05/26/17 0506  WBC 10.1 9.9  HGB 8.7* 8.5*  HCT 27.3* 27.0*  PLT 183 186    Recent Labs  05/25/17 1127  NA 135  K 4.0  CL 98*  GLUCOSE 101*  BUN 37*  CREATININE 3.20*  CALCIUM 9.0   CBG (last 3)   Recent Labs  05/25/17 1638 05/25/17 2105 05/26/17 0624  GLUCAP 126* 131* 94    Wt Readings from Last 3 Encounters:  05/26/17 63.2 kg (139 lb 6.4 oz)  05/07/17 75.3 kg (166 lb 0.1 oz)    Physical Exam:  BP 115/82 (BP Location: Right Arm)   Pulse (!) 105   Temp 99.9 F (37.7 C) (Oral)   Resp 15   Ht 5\' 5"  (1.651 m)   Wt 63.2 kg (139 lb 6.4 oz)   SpO2 95%   BMI 23.20 kg/m  Constitutional: She appears well-developed and well-nourished.  HENT: Normocephalic. Atraumatic. Eyes: EOMI. No discharge.  Cardiovascular: IRR/IRR Respiratory: CTA Bilaterally without wheezes or rales. Normal effort .  GI: Soft. Bowel sounds are normal.   Musculoskeletal: She exhibits edema and tenderness.  Neurological: She is alert and oriented.  Able to follow commands without difficulty.  Motor: B/l UE 4/5 proximal to distal RLE: HF 3/5, KE 3+/5, ADF/PF 4/5  LLE: HF 3-/5, KE 3/5, ADF/PF 3+/5 (stable) Skin: LLE with ulcerations with eschar on dorsum of foot and distal tibia with dressing c/d/i. --stable Psychiatric: She has a normal mood and affect. Her behavior is normal. Thought content normal.    Assessment/Plan: 1. 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 Physiatrist is providing close team supervision and 24 hour management of active medical problems listed below. Physiatrist and rehab team continue to assess barriers to discharge/monitor patient progress toward functional and medical goals.  Function:  Bathing Bathing position   Position: Sitting EOB  Bathing parts Body parts bathed by patient: Right arm, Left arm, Chest, Abdomen Body parts bathed by helper: Buttocks, Left lower leg, Back  Bathing assist Assist Level: Set up      Upper Body Dressing/Undressing Upper body dressing   What is the patient wearing?: Pull over shirt/dress     Pull over shirt/dress - Perfomed by patient: Put head through opening, Thread/unthread left sleeve, Thread/unthread right sleeve, Pull shirt over trunk Pull over shirt/dress - Perfomed by helper: Pull shirt over trunk        Upper body assist Assist Level: Set up   Set up : To obtain clothing/put away  Lower Body Dressing/Undressing Lower body dressing   What is the patient wearing?: Pants, Non-skid slipper socks     Pants- Performed by patient: Thread/unthread right pants leg, Thread/unthread left pants leg Pants- Performed by helper: Pull pants up/down Non-skid slipper socks- Performed by patient: Don/doff right sock Non-skid slipper socks- Performed by helper: Don/doff right sock  Lower body assist Assist for lower body dressing: Touching or steadying assistance (Pt > 75%)      Toileting Toileting Toileting activity did not occur: N/A (anuric)   Toileting steps completed by helper: Adjust clothing prior to toileting, Performs perineal hygiene, Adjust clothing after toileting    Toileting assist Assist level: Two helpers (+2 for clothing managment only)   Transfers Chair/bed transfer   Chair/bed transfer method: Lateral scoot Chair/bed transfer assist level: Maximal assist (Pt 25  - 49%/lift and lower) Chair/bed transfer assistive device: Armrests, Sliding board Mechanical lift: Stedy   Locomotion Ambulation Ambulation activity did not occur: Safety/medical concerns   Max distance: 13 Assist level: 2 helpers   Wheelchair   Type: Manual Max wheelchair distance: 10' Assist Level: Touching or steadying assistance (Pt > 75%)  Cognition Comprehension Comprehension assist level: Follows basic conversation/direction with no assist  Expression Expression assist level: Expresses basic needs/ideas: With no assist  Social Interaction Social Interaction assist level: Interacts appropriately with others with medication or extra time (anti-anxiety, antidepressant).  Problem Solving Problem solving assist level: Solves basic 90% of the time/requires cueing < 10% of the time  Memory Memory assist level: Recognizes or recalls 90% of the time/requires cueing < 10% of the time    Medical Problem List and Plan: 1.  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  DVT Prophylaxis/Anticoagulation: Pharmaceutical: Other (comment)--Eliquis on hold, heparin ggt for surgery  Aptt remains elevated. Appreciate pharmacy assistance. 3. Pain Management: tylenol prn 4. Mood: LCSW to follow for evaluation and support.  5. Neuropsych: This patient is capable of making decisions on her own behalf. 6. Skin/Wound Care: routine pressure relief measures. Prevalon boot for heel ulcer.  7. Fluids/Electrolytes/Nutrition: Strict I/Os.   8.  Hypotension: Monitor orthostatic BP. Added abdominal binder and TEDs to help with BP support.    Vitals:   05/26/17 0441 05/26/17 0558  BP: 115/82   Pulse: (!) 128 (!) 105  Resp: 15   Temp: 99.9 F (37.7 C)   SpO2: 95%    Controlled 8/17 9. Paroxysmal Afib:  on eliquis for stroke prevention. Continue amiodarone bid   Cardizem increased to 180 on 8/16.   Cont to monitor 10. Leukocytosis: Resolved               Patient with ulcers--continue with WOC recs/daily dressing             Afebrile             Cont to monitor 11. Anemia of chronic disease: On epogen  MWF.              Hb 8.7 on 8/17             Cont to montior 12. Left foot/leg ulcer: Daily dressing changes with santyl. Monitor for signs of infection.   Debrided 8/6, again 8/13  PRS for debridement in OR on Monday????  13. Anxiety/depression: On Seroquel and Zoloft at nights.  14. Prediabetes: CBG (last 3)   Recent Labs  05/25/17 1638 05/25/17 2105 05/26/17 0624  GLUCAP 126* 131* 94   Relatively controlled 8/18 15. CKD now HD dependent: HD on MWF at end of day to help with tolerance of therapy. Renal diet--Reducate patient on appropriate diet.  16. Vaginal bleeding  Per PCP no issues prior  Gyn consulted, Megace initiated, transvaginal U/S showing endometrial thickening and mass.  Contacted Gyn, per recs, outpt workup to rule  out CA.  Improved at present 17. Abnormal UA  UA +, Ucx with multiple species 18. Vaginal discharge  Wet prep with many WBCs  Improved at present  LOS (Days) 19 A FACE TO FACE EVALUATION WAS PERFORMED  Tarvares Lant T 05/26/2017 9:13 AM

## 2017-05-27 ENCOUNTER — Inpatient Hospital Stay (HOSPITAL_COMMUNITY): Payer: Medicare Other | Admitting: Occupational Therapy

## 2017-05-27 ENCOUNTER — Ambulatory Visit: Payer: Self-pay | Admitting: Plastic Surgery

## 2017-05-27 DIAGNOSIS — S81802A Unspecified open wound, left lower leg, initial encounter: Secondary | ICD-10-CM

## 2017-05-27 LAB — CBC
HCT: 28 % — ABNORMAL LOW (ref 36.0–46.0)
Hemoglobin: 8.7 g/dL — ABNORMAL LOW (ref 12.0–15.0)
MCH: 32.3 pg (ref 26.0–34.0)
MCHC: 31.1 g/dL (ref 30.0–36.0)
MCV: 104.1 fL — AB (ref 78.0–100.0)
PLATELETS: 177 10*3/uL (ref 150–400)
RBC: 2.69 MIL/uL — ABNORMAL LOW (ref 3.87–5.11)
RDW: 24.1 % — AB (ref 11.5–15.5)
WBC: 9.7 10*3/uL (ref 4.0–10.5)

## 2017-05-27 LAB — APTT: aPTT: 69 seconds — ABNORMAL HIGH (ref 24–36)

## 2017-05-27 LAB — HEPARIN LEVEL (UNFRACTIONATED): Heparin Unfractionated: 1.1 IU/mL — ABNORMAL HIGH (ref 0.30–0.70)

## 2017-05-27 LAB — MRSA PCR SCREENING: MRSA by PCR: NEGATIVE

## 2017-05-27 LAB — GLUCOSE, CAPILLARY
GLUCOSE-CAPILLARY: 95 mg/dL (ref 65–99)
GLUCOSE-CAPILLARY: 114 mg/dL — AB (ref 65–99)
Glucose-Capillary: 105 mg/dL — ABNORMAL HIGH (ref 65–99)
Glucose-Capillary: 110 mg/dL — ABNORMAL HIGH (ref 65–99)

## 2017-05-27 MED ORDER — CHLORHEXIDINE GLUCONATE CLOTH 2 % EX PADS: 6.0000 | MEDICATED_PAD | Freq: Once | CUTANEOUS | Status: DC

## 2017-05-27 MED ORDER — CHLORHEXIDINE GLUCONATE CLOTH 2 % EX PADS
6.0000 | MEDICATED_PAD | Freq: Once | CUTANEOUS | Status: AC
  Administered 2017-05-27: 6 via TOPICAL

## 2017-05-27 MED ORDER — CEFAZOLIN SODIUM-DEXTROSE 2-4 GM/100ML-% IV SOLN
2.0000 g | INTRAVENOUS | Status: AC
  Administered 2017-05-28: 2 g via INTRAVENOUS
  Filled 2017-05-27 (×2): qty 100

## 2017-05-27 NOTE — Progress Notes (Signed)
ANTICOAGULATION CONSULT NOTE - Follow Up Consult  Pharmacy Consult for heparin Indication: atrial fibrillation  No Known Allergies  Patient Measurements: Height: 5\' 5"  (165.1 cm) Weight: 134 lb 7.7 oz (61 kg) IBW/kg (Calculated) : 57 Heparin Dosing Weight: 63 kg  Vital Signs: Temp: 98.5 F (36.9 C) (08/19 0445) Temp Source: Oral (08/19 0445) BP: 123/88 (08/19 0445) Pulse Rate: 124 (08/19 0546)  Labs:  Recent Labs  05/25/17 0248 05/25/17 1127 05/26/17 0506 05/27/17 0648  HGB 8.7*  --  8.5* 8.7*  HCT 27.3*  --  27.0* 28.0*  PLT 183  --  186 177  APTT 87* 65* 71* 69*  HEPARINUNFRC >2.20*  --  1.78* 1.10*  CREATININE  --  3.20*  --   --     Estimated Creatinine Clearance: 15.8 mL/min (A) (by C-G formula based on SCr of 3.2 mg/dL (H)).   Medications:  Scheduled:  . amiodarone  200 mg Oral BID  . collagenase   Topical Daily  . darbepoetin (ARANESP) injection - DIALYSIS  60 mcg Intravenous Q Wed-HD  . diltiazem  180 mg Oral Daily  . famotidine  20 mg Oral QHS  . feeding supplement (PRO-STAT SUGAR FREE 64)  30 mL Oral BID  . hydrocerin   Topical BID  . levothyroxine  100 mcg Oral QAC breakfast  . megestrol  40 mg Oral BID  . midodrine  10 mg Oral TID WC  . multivitamin  1 tablet Oral QHS  . QUEtiapine  25 mg Oral QHS  . senna-docusate  2 tablet Oral QHS  . sertraline  25 mg Oral QHS   Infusions:  . heparin 650 Units/hr (05/25/17 1946)    Assessment: 63 yoF on PTA Eliquis for afib. Transitioned to heparin in anticipation of wound debridement in OR 8/20. Last dose eliquis 05/23/17 @ 9458. Heparin levels are erroneously influenced by recent Eliquis use and likely slow to correlate with aPTT 2/2 to AKI. Will continue with aPTT monitoring for now. aPTT within goal range at 69. CBC low but stable, pltc WNL. No bleeding noted.  Goal of Therapy:  aPTT 66-102 seconds Monitor platelets by anticoagulation protocol: Yes   Plan:  Continue heparin gtt at 650 units/hr    Daily heparin level, aPTT, and CBC Monitor for s/sx bleeding   Erin N. Gerarda Fraction, PharmD PGY1 Pharmacy Resident Pager: [phone]

## 2017-05-27 NOTE — Progress Notes (Signed)
PHYSICAL MEDICINE & REHABILITATION     PROGRESS NOTE  Subjective/Complaints:  Had a reasonable night. No problems with HD. Able to sleep last night  ROS: pt denies nausea, vomiting, diarrhea, cough, shortness of breath or chest pain   Objective: Vital Signs: Blood pressure 123/88, pulse (!) 124, temperature 98.5 F (36.9 C), temperature source Oral, resp. rate 16, height 5\' 5"  (1.651 m), weight 61 kg (134 lb 7.7 oz), SpO2 95 %. No results found.  Recent Labs  05/26/17 0506 05/27/17 0648  WBC 9.9 9.7  HGB 8.5* 8.7*  HCT 27.0* 28.0*  PLT 186 177    Recent Labs  05/25/17 1127  NA 135  K 4.0  CL 98*  GLUCOSE 101*  BUN 37*  CREATININE 3.20*  CALCIUM 9.0   CBG (last 3)   Recent Labs  05/26/17 1757 05/26/17 2145 05/27/17 0625  GLUCAP 76 107* 95    Wt Readings from Last 3 Encounters:  05/27/17 61 kg (134 lb 7.7 oz)  05/07/17 75.3 kg (166 lb 0.1 oz)    Physical Exam:  BP 123/88 (BP Location: Right Arm)   Pulse (!) 124   Temp 98.5 F (36.9 C) (Oral)   Resp 16   Ht 5\' 5"  (1.651 m)   Wt 61 kg (134 lb 7.7 oz)   SpO2 95%   BMI 22.38 kg/m  Constitutional: She appears well-developed and well-nourished.  HENT: Normocephalic. Atraumatic. Eyes: EOMI. No discharge.  Cardiovascular: IRR/IRR Respiratory: CTA Bilaterally without wheezes or rales. Normal effort  .  GI: Soft. Bowel sounds are normal.   Musculoskeletal: She exhibits edema and tenderness.  Neurological: She is alert and oriented.  Able to follow commands without difficulty.  Motor: B/l UE 4/5 proximal to distal RLE: HF 3/5, KE 3+/5, ADF/PF 4/5  LLE: HF 3-/5, KE 3/5, ADF/PF 3+/5 (stable) Skin: LLE with ulcerations with eschar on dorsum of foot and distal tibia with dressing c/d/i. --stable/dressed Psychiatric: She has a normal mood and affect. Her behavior is normal. Thought content normal.    Assessment/Plan: 1. 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 Physiatrist is providing close team supervision and 24 hour management of active medical problems listed below. Physiatrist and rehab team continue to assess barriers to discharge/monitor patient progress toward functional and medical goals.  Function:  Bathing Bathing position   Position: Other (comment) (EOB UB, bedlevel LB)  Bathing parts Body parts bathed by patient: Right arm, Left arm, Chest, Abdomen, Front perineal area, Buttocks, Right upper leg, Left upper leg, Right lower leg Body parts bathed by helper: Back, Left lower leg  Bathing assist Assist Level: Touching or steadying assistance(Pt > 75%)      Upper Body Dressing/Undressing Upper body dressing   What is the patient wearing?: Pull over shirt/dress     Pull over shirt/dress - Perfomed by patient: Put head through opening, Thread/unthread left sleeve, Thread/unthread right sleeve, Pull shirt over trunk Pull over shirt/dress - Perfomed by helper: Pull shirt over trunk        Upper body assist Assist Level: Set up   Set up : To obtain clothing/put away  Lower Body Dressing/Undressing Lower body dressing   What is the patient wearing?: Pants, Non-skid slipper socks     Pants- Performed by patient: Thread/unthread right pants leg Pants- Performed by helper: Pull pants up/down, Thread/unthread left pants leg Non-skid slipper socks- Performed by patient: Don/doff right sock  Non-skid slipper socks- Performed by helper: Don/doff right sock                  Lower body assist Assist for lower body dressing: Touching or steadying assistance (Pt > 75%)      Toileting Toileting Toileting activity did not occur: N/A (anuric)   Toileting steps completed by helper: Adjust clothing prior to toileting, Performs perineal hygiene, Adjust clothing after toileting    Toileting assist Assist level: Two helpers (+2 for clothing managment only)    Transfers Chair/bed transfer   Chair/bed transfer method: Lateral scoot Chair/bed transfer assist level: Maximal assist (Pt 25 - 49%/lift and lower) Chair/bed transfer assistive device: Armrests, Sliding board Mechanical lift: Stedy   Locomotion Ambulation Ambulation activity did not occur: Safety/medical concerns   Max distance: 13 Assist level: 2 helpers   Wheelchair   Type: Manual Max wheelchair distance: 10' Assist Level: Touching or steadying assistance (Pt > 75%)  Cognition Comprehension Comprehension assist level: Understands complex 90% of the time/cues 10% of the time  Expression Expression assist level: Expresses complex 90% of the time/cues < 10% of the time  Social Interaction Social Interaction assist level: Interacts appropriately with others with medication or extra time (anti-anxiety, antidepressant).  Problem Solving Problem solving assist level: Solves basic 90% of the time/requires cueing < 10% of the time  Memory Memory assist level: Requires cues to use assistive device    Medical Problem List and Plan: 1.  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  DVT Prophylaxis/Anticoagulation: Pharmaceutical: Other (comment)--Eliquis on hold, heparin ggt for surgery  Aptt   elevated. Appreciate pharmacy assistance. 3. Pain Management: tylenol prn 4. Mood: LCSW to follow for evaluation and support.  5. Neuropsych: This patient is capable of making decisions on her own behalf. 6. Skin/Wound Care: routine pressure relief measures. Prevalon boot for heel ulcer.  7. Fluids/Electrolytes/Nutrition: Strict I/Os.   8.  Hypotension: Monitor orthostatic BP. Added abdominal binder and TEDs to help with BP support.    Vitals:   05/27/17 0445 05/27/17 0546  BP: 123/88   Pulse:  (!) 124  Resp:    Temp: 98.5 F (36.9 C)   SpO2: 95%    Controlled 8/17 9. Paroxysmal Afib:  on eliquis for stroke prevention. Continue amiodarone bid    Cardizem increased to 180 on 8/16.   Cont to monitor 10. Leukocytosis: Resolved              Patient with ulcers--continue with WOC recs/daily dressing             Afebrile             Cont to monitor 11. Anemia of chronic disease: On epogen  MWF.              Hb 8.7 on 8/17             Cont to montior 12. Left foot/leg ulcer: Daily dressing changes with santyl. Monitor for signs of infection.   Debrided 8/6, again 8/13  PRS for debridement in OR on Monday   13. Anxiety/depression: On Seroquel and Zoloft at nights.  14. Prediabetes: CBG (last 3)   Recent Labs  05/26/17 1757 05/26/17 2145 05/27/17 0625  GLUCAP 76 107* 95   Relatively controlled 8/19 15. CKD now HD dependent: HD on MWF at end of day to help with tolerance of therapy. Renal diet--Reducate patient on appropriate diet.  16.  Vaginal bleeding  Per PCP no issues prior  Gyn consulted, Megace initiated, transvaginal U/S showing endometrial thickening and mass.  Contacted Gyn, per recs, outpt workup to rule out CA.  Improved at present 17. Abnormal UA  UA +, Ucx with multiple species 18. Vaginal discharge  Wet prep with many WBCs  Improved at present  LOS (Days) 20 A FACE TO FACE EVALUATION WAS PERFORMED  Jeffie Spivack T 05/27/2017 9:43 AM

## 2017-05-27 NOTE — Progress Notes (Signed)
Occupational Therapy Session Note  Patient Details  Name: [name] MRN: [id number] Date of Birth: Mar 06, 1951  Today's Date: 05/27/2017 OT Individual Time: 0762-2633 OT Individual Time Calculation (min): 32 min   Skilled Therapeutic Interventions/Progress Updates:    Tx focus on UE strengthening and endurance.   Pt greeted supine in bed, requesting to complete tx in bed due to wanting to take it easy in prep for procedure tomorrow, and being hooked up to IV. Pt engaging in AAROM exercises in all planes of movement, manual assist for maintaining forearm extension and reaching full ROM. Resistive tricep pushes with bilateral UEs to OT's hands, with pt fatiguing after 30 second bouts. Discussed beneficial therapeutic activities to complete at home, emphasis on occupation-based tasks. At end of tx pt was repositioned for comfort and left with all needs within reach.   Therapy Documentation Precautions:  Precautions Precautions: Fall Restrictions Weight Bearing Restrictions: No Vital Signs: Therapy Vitals Temp: 98.4 F (36.9 C) Temp Source: Oral Pulse Rate: (!) 103 Resp: 17 BP: (!) 113/93 Patient Position (if appropriate): Lying Pain: No c/o pain during tx      See Function Navigator for Current Functional Status.   Therapy/Group: Individual Therapy  Arshan Jabs A Evander Macaraeg 05/27/2017, 4:11 PM

## 2017-05-28 ENCOUNTER — Encounter (HOSPITAL_COMMUNITY): Payer: Self-pay

## 2017-05-28 ENCOUNTER — Inpatient Hospital Stay (HOSPITAL_COMMUNITY): Admission: RE | Admit: 2017-05-28 | Payer: Medicare Other | Source: Ambulatory Visit | Admitting: Plastic Surgery

## 2017-05-28 ENCOUNTER — Encounter (HOSPITAL_COMMUNITY)
Admission: RE | Disposition: A | Discharge status: Other Acute Inpatient Hospital | Payer: Self-pay | Source: Intra-hospital | Attending: Physical Medicine & Rehabilitation

## 2017-05-28 ENCOUNTER — Inpatient Hospital Stay (HOSPITAL_COMMUNITY): Payer: Medicare Other | Admitting: Certified Registered Nurse Anesthetist

## 2017-05-28 HISTORY — PX: APPLICATION OF WOUND VAC: SHX5189

## 2017-05-28 HISTORY — PX: INCISION AND DRAINAGE OF WOUND: SHX1803

## 2017-05-28 HISTORY — PX: APPLICATION OF A-CELL OF EXTREMITY: SHX6303

## 2017-05-28 LAB — CBC
HCT: 27.3 % — ABNORMAL LOW (ref 36.0–46.0)
Hemoglobin: 8.4 g/dL — ABNORMAL LOW (ref 12.0–15.0)
MCH: 32.3 pg (ref 26.0–34.0)
MCHC: 30.8 g/dL (ref 30.0–36.0)
MCV: 105 fL — AB (ref 78.0–100.0)
Platelets: 190 10*3/uL (ref 150–400)
RBC: 2.6 MIL/uL — ABNORMAL LOW (ref 3.87–5.11)
RDW: 24.3 % — AB (ref 11.5–15.5)
WBC: 10.2 10*3/uL (ref 4.0–10.5)

## 2017-05-28 LAB — HEPARIN LEVEL (UNFRACTIONATED)
HEPARIN UNFRACTIONATED: 0.74 [IU]/mL — AB (ref 0.30–0.70)
HEPARIN UNFRACTIONATED: 0.65 [IU]/mL (ref 0.30–0.70)

## 2017-05-28 LAB — POCT I-STAT 4, (NA,K, GLUC, HGB,HCT)
GLUCOSE: 81 mg/dL (ref 65–99)
HCT: 27 % — ABNORMAL LOW (ref 36.0–46.0)
Hemoglobin: 9.2 g/dL — ABNORMAL LOW (ref 12.0–15.0)
POTASSIUM: 4 mmol/L (ref 3.5–5.1)
Sodium: 136 mmol/L (ref 135–145)

## 2017-05-28 LAB — APTT
aPTT: 62 seconds — ABNORMAL HIGH (ref 24–36)
aPTT: 54 seconds — ABNORMAL HIGH (ref 24–36)

## 2017-05-28 LAB — GLUCOSE, CAPILLARY
GLUCOSE-CAPILLARY: 90 mg/dL (ref 65–99)
GLUCOSE-CAPILLARY: 86 mg/dL (ref 65–99)
GLUCOSE-CAPILLARY: 128 mg/dL — AB (ref 65–99)
Glucose-Capillary: 91 mg/dL (ref 65–99)
Glucose-Capillary: 84 mg/dL (ref 65–99)

## 2017-05-28 SURGERY — IRRIGATION AND DEBRIDEMENT WOUND
Anesthesia: General | Site: Leg Lower | Laterality: Left

## 2017-05-28 MED ORDER — PROMETHAZINE HCL 25 MG/ML IJ SOLN: 6.2500 mg | INTRAMUSCULAR | Status: DC | PRN

## 2017-05-28 MED ORDER — EPHEDRINE SULFATE-NACL 50-0.9 MG/10ML-% IV SOSY
PREFILLED_SYRINGE | INTRAVENOUS | Status: DC | PRN
  Administered 2017-05-28: 10 mg via INTRAVENOUS
  Administered 2017-05-28: 15 mg via INTRAVENOUS

## 2017-05-28 MED ORDER — ONDANSETRON HCL 4 MG/2ML IJ SOLN
INTRAMUSCULAR | Status: AC
Start: 2017-05-28 — End: 2017-05-28
  Filled 2017-05-28: qty 2

## 2017-05-28 MED ORDER — MIDAZOLAM HCL 2 MG/2ML IJ SOLN: 0.5000 mg | Freq: Once | INTRAMUSCULAR | Status: DC | PRN

## 2017-05-28 MED ORDER — MEPERIDINE HCL 25 MG/ML IJ SOLN: 6.2500 mg | INTRAMUSCULAR | Status: DC | PRN

## 2017-05-28 MED ORDER — LIDOCAINE 2% (20 MG/ML) 5 ML SYRINGE
INTRAMUSCULAR | Status: DC | PRN
  Administered 2017-05-28: 20 mg via INTRAVENOUS

## 2017-05-28 MED ORDER — FENTANYL CITRATE (PF) 100 MCG/2ML IJ SOLN
INTRAMUSCULAR | Status: DC | PRN
  Administered 2017-05-28 (×2): 50 ug via INTRAVENOUS

## 2017-05-28 MED ORDER — FENTANYL CITRATE (PF) 250 MCG/5ML IJ SOLN
INTRAMUSCULAR | Status: AC
  Filled 2017-05-28: qty 5

## 2017-05-28 MED ORDER — DILTIAZEM HCL ER COATED BEADS 180 MG PO CP24
180.0000 mg | ORAL_CAPSULE | Freq: Every day | ORAL | Status: DC
  Filled 2017-05-28: qty 1

## 2017-05-28 MED ORDER — SUCCINYLCHOLINE CHLORIDE 200 MG/10ML IV SOSY
PREFILLED_SYRINGE | INTRAVENOUS | Status: DC | PRN
  Administered 2017-05-28: 100 mg via INTRAVENOUS

## 2017-05-28 MED ORDER — SODIUM CHLORIDE 0.9 % IV SOLN
INTRAVENOUS | Status: DC | PRN
  Administered 2017-05-28: 500 mL

## 2017-05-28 MED ORDER — ONDANSETRON HCL 4 MG/2ML IJ SOLN
INTRAMUSCULAR | Status: DC | PRN
  Administered 2017-05-28: 4 mg via INTRAVENOUS

## 2017-05-28 MED ORDER — PROPOFOL 10 MG/ML IV BOLUS
INTRAVENOUS | Status: AC
Start: 2017-05-28 — End: 2017-05-28
  Filled 2017-05-28: qty 20

## 2017-05-28 MED ORDER — 0.9 % SODIUM CHLORIDE (POUR BTL) OPTIME
TOPICAL | Status: DC | PRN
  Administered 2017-05-28: 1000 mL

## 2017-05-28 MED ORDER — ETOMIDATE 2 MG/ML IV SOLN
INTRAVENOUS | Status: DC | PRN
  Administered 2017-05-28: 18 mg via INTRAVENOUS

## 2017-05-28 MED ORDER — PHENYLEPHRINE 40 MCG/ML (10ML) SYRINGE FOR IV PUSH (FOR BLOOD PRESSURE SUPPORT)
PREFILLED_SYRINGE | INTRAVENOUS | Status: DC | PRN
  Administered 2017-05-28: 120 ug via INTRAVENOUS
  Administered 2017-05-28 (×2): 80 ug via INTRAVENOUS
  Administered 2017-05-28: 120 ug via INTRAVENOUS

## 2017-05-28 MED ORDER — ESMOLOL HCL 100 MG/10ML IV SOLN
INTRAVENOUS | Status: DC | PRN
  Administered 2017-05-28: 30 mg via INTRAVENOUS
  Administered 2017-05-28: 20 mg via INTRAVENOUS

## 2017-05-28 MED ORDER — SODIUM CHLORIDE 0.9 % IV SOLN
INTRAVENOUS | Status: DC
  Administered 2017-05-28: 09:00:00 via INTRAVENOUS

## 2017-05-28 MED ORDER — PHENYLEPHRINE HCL 10 MG/ML IJ SOLN
INTRAVENOUS | Status: DC | PRN
  Administered 2017-05-28: 25 ug/min via INTRAVENOUS

## 2017-05-28 MED ORDER — MIDAZOLAM HCL 2 MG/2ML IJ SOLN
INTRAMUSCULAR | Status: AC
  Filled 2017-05-28: qty 2

## 2017-05-28 MED ORDER — MIDAZOLAM HCL 5 MG/5ML IJ SOLN
INTRAMUSCULAR | Status: DC | PRN
  Administered 2017-05-28 (×2): 1 mg via INTRAVENOUS

## 2017-05-28 MED ORDER — FENTANYL CITRATE (PF) 100 MCG/2ML IJ SOLN: 25.0000 ug | INTRAMUSCULAR | Status: DC | PRN

## 2017-05-28 SURGICAL SUPPLY — 56 items
APPLICATOR COTTON TIP 6IN STRL (MISCELLANEOUS) IMPLANT
BAG DECANTER FOR FLEXI CONT (MISCELLANEOUS) IMPLANT
BENZOIN TINCTURE PRP APPL 2/3 (GAUZE/BANDAGES/DRESSINGS) ×3 IMPLANT
CANISTER SUCT 3000ML PPV (MISCELLANEOUS) ×3 IMPLANT
CANISTER WOUND CARE 500ML ATS (WOUND CARE) ×3 IMPLANT
CONT SPEC 4OZ CLIKSEAL STRL BL (MISCELLANEOUS) IMPLANT
COVER SURGICAL LIGHT HANDLE (MISCELLANEOUS) ×3 IMPLANT
DRAPE HALF SHEET 40X57 (DRAPES) IMPLANT
DRAPE IMP U-DRAPE 54X76 (DRAPES) ×3 IMPLANT
DRAPE INCISE IOBAN 66X45 STRL (DRAPES) IMPLANT
DRAPE LAPAROSCOPIC ABDOMINAL (DRAPES) IMPLANT
DRAPE LAPAROTOMY 100X72 PEDS (DRAPES) ×3 IMPLANT
DRESSING HYDROCOLLOID 4X4 (GAUZE/BANDAGES/DRESSINGS) ×3 IMPLANT
DRESSING HYDROCOLLOID 4X4 XTH (GAUZE/BANDAGES/DRESSINGS) ×3 IMPLANT
DRSG ADAPTIC 3X8 NADH LF (GAUZE/BANDAGES/DRESSINGS) IMPLANT
DRSG CUTIMED SORBACT 7X9 (GAUZE/BANDAGES/DRESSINGS) ×3 IMPLANT
DRSG PAD ABDOMINAL 8X10 ST (GAUZE/BANDAGES/DRESSINGS) IMPLANT
DRSG VAC ATS LRG SENSATRAC (GAUZE/BANDAGES/DRESSINGS) IMPLANT
DRSG VAC ATS MED SENSATRAC (GAUZE/BANDAGES/DRESSINGS) IMPLANT
DRSG VAC ATS SM SENSATRAC (GAUZE/BANDAGES/DRESSINGS) IMPLANT
ELECT CAUTERY BLADE 6.4 (BLADE) ×3 IMPLANT
ELECT REM PT RETURN 9FT ADLT (ELECTROSURGICAL) ×3
ELECTRODE REM PT RTRN 9FT ADLT (ELECTROSURGICAL) ×1 IMPLANT
GAUZE SPONGE 4X4 12PLY STRL (GAUZE/BANDAGES/DRESSINGS) IMPLANT
GEL ULTRASOUND 20GR AQUASONIC (MISCELLANEOUS) IMPLANT
GLOVE BIO SURGEON STRL SZ 6.5 (GLOVE) ×4 IMPLANT
GLOVE BIO SURGEONS STRL SZ 6.5 (GLOVE) ×2
GOWN STRL REUS W/ TWL LRG LVL3 (GOWN DISPOSABLE) ×3 IMPLANT
GOWN STRL REUS W/TWL LRG LVL3 (GOWN DISPOSABLE) ×6
KIT BASIN OR (CUSTOM PROCEDURE TRAY) ×3 IMPLANT
KIT ROOM TURNOVER OR (KITS) ×3 IMPLANT
MATRIX SURGICAL PSM 10X15CM (Tissue) ×3 IMPLANT
MICROMATRIX 1000MG (Tissue) ×3 IMPLANT
NEEDLE HYPO 25GX1X1/2 BEV (NEEDLE) ×3 IMPLANT
NS IRRIG 1000ML POUR BTL (IV SOLUTION) ×3 IMPLANT
PACK GENERAL/GYN (CUSTOM PROCEDURE TRAY) ×3 IMPLANT
PACK ORTHO EXTREMITY (CUSTOM PROCEDURE TRAY) ×3 IMPLANT
PACK UNIVERSAL I (CUSTOM PROCEDURE TRAY) ×3 IMPLANT
PAD ARMBOARD 7.5X6 YLW CONV (MISCELLANEOUS) ×6 IMPLANT
SOLUTION PARTIC MCRMTRX 1000MG (Tissue) ×1 IMPLANT
STAPLER VISISTAT 35W (STAPLE) ×3 IMPLANT
STOCKINETTE IMPERVIOUS 9X36 MD (GAUZE/BANDAGES/DRESSINGS) IMPLANT
STOCKINETTE IMPERVIOUS LG (DRAPES) IMPLANT
SURGILUBE 2OZ TUBE FLIPTOP (MISCELLANEOUS) IMPLANT
SUT MNCRL AB 4-0 PS2 18 (SUTURE) IMPLANT
SUT SILK 4 0 P 3 (SUTURE) ×3 IMPLANT
SUT VIC AB 5-0 PS2 18 (SUTURE) ×6 IMPLANT
SWAB COLLECTION DEVICE MRSA (MISCELLANEOUS) IMPLANT
SWAB CULTURE ESWAB REG 1ML (MISCELLANEOUS) IMPLANT
SYR CONTROL 10ML LL (SYRINGE) ×3 IMPLANT
TOWEL OR 17X24 6PK STRL BLUE (TOWEL DISPOSABLE) ×3 IMPLANT
TOWEL OR 17X26 10 PK STRL BLUE (TOWEL DISPOSABLE) ×3 IMPLANT
TUBE CONNECTING 12'X1/4 (SUCTIONS) ×1
TUBE CONNECTING 12X1/4 (SUCTIONS) ×2 IMPLANT
UNDERPAD 30X30 (UNDERPADS AND DIAPERS) ×3 IMPLANT
YANKAUER SUCT BULB TIP NO VENT (SUCTIONS) ×3 IMPLANT

## 2017-05-28 NOTE — Plan of Care (Signed)
Problem: RH SKIN INTEGRITY Goal: RH STG SKIN FREE OF INFECTION/BREAKDOWN Patients skin will remain free from further infection or breakdown with mod assist.  Outcome: Not Progressing L leg wound for debridement in OR.

## 2017-05-28 NOTE — Progress Notes (Signed)
Patient returned from PACU, still drowsy, responds to voice and commands. Wound vac in place and patient has no complaints of pain. Patient resting comfortably in bed with call bell at side and currently on 2 liters O2 nasal cannula from PACU while awakening from surgery.

## 2017-05-28 NOTE — Progress Notes (Addendum)
ANTICOAGULATION CONSULT NOTE - Follow Up Consult  Pharmacy Consult for heparin Indication: atrial fibrillation  No Known Allergies  Patient Measurements: Height: 5\' 5"  (165.1 cm) Weight: 147 lb 11.3 oz (67 kg) IBW/kg (Calculated) : 57 Heparin Dosing Weight:   Vital Signs: Temp: 98.8 F (37.1 C) (08/20 0545) Temp Source: Oral (08/20 0545) BP: 118/80 (08/20 0545) Pulse Rate: 122 (08/20 0545)  Labs:  Recent Labs  05/25/17 1127  05/26/17 0506 05/27/17 0648 05/28/17 0459  HGB  --   < > 8.5* 8.7* 8.4*  HCT  --   --  27.0* 28.0* 27.3*  PLT  --   --  186 177 190  APTT 65*  --  71* 69* 62*  HEPARINUNFRC  --   --  1.78* 1.10* 0.74*  CREATININE 3.20*  --   --   --   --   < > = values in this interval not displayed.  Estimated Creatinine Clearance: 15.8 mL/min (A) (by C-G formula based on SCr of 3.2 mg/dL (H)).   Medications:  Scheduled:  . amiodarone  200 mg Oral BID  . Chlorhexidine Gluconate Cloth  6 each Topical Once  . collagenase   Topical Daily  . darbepoetin (ARANESP) injection - DIALYSIS  60 mcg Intravenous Q Wed-HD  . diltiazem  180 mg Oral Daily  . famotidine  20 mg Oral QHS  . feeding supplement (PRO-STAT SUGAR FREE 64)  30 mL Oral BID  . hydrocerin   Topical BID  . levothyroxine  100 mcg Oral QAC breakfast  . megestrol  40 mg Oral BID  . midodrine  10 mg Oral TID WC  . multivitamin  1 tablet Oral QHS  . QUEtiapine  25 mg Oral QHS  . senna-docusate  2 tablet Oral QHS  . sertraline  25 mg Oral QHS   Infusions:  .  ceFAZolin (ANCEF) IV    . heparin 650 Units/hr (05/25/17 1946)    Assessment: 66 yo female with afib is currently on heparin.  Heparin level is 0.74 and aPTT is 62.   Goal of Therapy:  aPTT 66-102 seconds; heparin level 0.3-0.7 Monitor platelets by anticoagulation protocol: Yes   Plan:  - Since aPTT is borderline low and heparin level is borderline high, will continue heparin at 650 units/hr (heparin level should start to correlate to  aPTT) - f/u after surgery to see if resume heparin or apixaban - daily heparin level, aPTT and CBC if continued on heparin  Takeyah Wieman, Tsz-Yin 05/28/2017,8:02 AM   Addendum: Heparin stopped at 0730 for I&D and was resumed at 1130 at 650 units/hr  Plan - check a 8hr heparin level and aPTT

## 2017-05-28 NOTE — Transfer of Care (Signed)
Immediate Anesthesia Transfer of Care Note  Patient: [name]  Procedure(s) Performed: Procedure(s): IRRIGATION AND DEBRIDEMENT LEFT LOWER LEG WOUND (Left) APPLICATION OF A-CELL OF LEFT LOWER EXTREMITY (Left) APPLICATION OF WOUND VAC (Left)  Patient Location: PACU  Anesthesia Type:General  Level of Consciousness: awake, alert , oriented, drowsy and patient cooperative  Airway & Oxygen Therapy: Patient Spontanous Breathing and Patient connected to nasal cannula oxygen  Post-op Assessment: Report given to RN and Post -op Vital signs reviewed and stable  Post vital signs: Reviewed and stable  Last Vitals:  Vitals:   05/27/17 1500 05/28/17 0545  BP: (!) 113/93 118/80  Pulse: (!) 103 (!) 122  Resp: 17 (!) 22  Temp: 36.9 C 37.1 C  SpO2: 99% 95%    Last Pain:  Vitals:   05/28/17 0545  TempSrc: Oral  PainSc:       Patients Stated Pain Goal: 0 (33/58/25 1898)  Complications: No apparent anesthesia complications

## 2017-05-28 NOTE — Anesthesia Preprocedure Evaluation (Signed)
Anesthesia Evaluation  Patient identified by MRN, date of birth, ID band Patient awake    Reviewed: Allergy & Precautions, NPO status , Patient's Chart, lab work & pertinent test results  History of Anesthesia Complications Negative for: history of anesthetic complications  Airway Mallampati: II  TM Distance: >3 FB Neck ROM: Full    Dental  (+) Poor Dentition, Loose, Dental Advisory Given, Missing, Chipped   Pulmonary neg pulmonary ROS,    breath sounds clear to auscultation       Cardiovascular hypertension, + dysrhythmias Atrial Fibrillation  Rhythm:Irregular Rate:Normal  7/18 ECHO: EF 25-30%, mod MR, mod TR   Neuro/Psych Anxiety Depression negative neurological ROS     GI/Hepatic negative GI ROS, Neg liver ROS,   Endo/Other  Hypothyroidism   Renal/GU Renal disease     Musculoskeletal  (+) Arthritis ,   Abdominal   Peds  Hematology  (+) Blood dyscrasia, anemia , Hb 8.4 Off pradaxa, now on heparin   Anesthesia Other Findings Recent septic shock with resp failure, requiring vent support, AKI requiring dialysis, episode of PEA ([place]) Now extubated on RA, still requiring hemodialysis  Reproductive/Obstetrics                             Anesthesia Physical Anesthesia Plan  ASA: III  Anesthesia Plan: General   Post-op Pain Management:    Induction: Intravenous  PONV Risk Score and Plan: 3 and Ondansetron, Dexamethasone and Midazolam  Airway Management Planned: Oral ETT  Additional Equipment:   Intra-op Plan:   Post-operative Plan: Extubation in OR  Informed Consent: I have reviewed the patients History and Physical, chart, labs and discussed the procedure including the risks, benefits and alternatives for the proposed anesthesia with the patient or authorized representative who has indicated his/her understanding and acceptance.   Dental advisory given  Plan Discussed  with: CRNA and Surgeon  Anesthesia Plan Comments: (Plan routine monitors, GETA )        Anesthesia Quick Evaluation

## 2017-05-28 NOTE — H&P (Signed)
[name] is an 66 y.o. female.   Chief Complaint: left leg wound HPI: The patient is a 66 yrs old wf here for left leg cellulitis.  She has multiple medical conditions including hypertension, a fib, sepsis, PEA arrest and kidneys disease on dialysis.  There is necrosis of the left anterior leg with nonviable tissue present.  She is stable and doing better over all.    History:  She was admitted to [facility] on 04/08/17 due to BLE cellulitis. She developed sepsis with leucocytosis and hypotensive requiring fluid bolus and pressors. Hospital course significant for PEA arrest 7/4, ARDS, anuria requiring IJ placement now HD dependent, shocked liver with abnormal LFTs, thrombocytopenia.  She continues on amiodarone and Cardizem for HR control.  Eliquis recommended for A fib/stroke prophylaxis. Echo = EF 25-30%. Serologic work up negative and limited hope for renal recovery per nephrology--IV albumin being used during HD treatment prn.   Past Medical History:  Diagnosis Date  . A-fib ([place])    on pradaxa  . Chronic kidney disease   . Dysrhythmia   . Hypertension   . Hypothyroidism   . Thyroid disease     Past Surgical History:  Procedure Laterality Date  . DIALYSIS/PERMA CATHETER INSERTION N/A 04/26/2017   Procedure: Dialysis/Perma Catheter Insertion;  Surgeon: Algernon Huxley, MD;  Location: [place] [facility];  Service: Cardiovascular;  Laterality: N/A;  . PR DEBRIDEMENT, SKIN, SUB-Q TISSUE,=<20 SQ CM  05/21/2017      . PR DEBRIDEMENT, SKIN, SUB-Q TISSUE,EACH ADD 20 SQ CM  05/21/2017      . WRIST ARTHROSCOPY      Family History  Problem Relation Age of Onset  . AAA (abdominal aortic aneurysm) Mother    Social History:  reports that she has never smoked. She has never used smokeless tobacco. She reports that she does not drink alcohol or use drugs.  Allergies: No Known Allergies  Medications Prior to Admission  Medication Sig Dispense Refill  . ALPRAZolam (XANAX) 0.25 MG tablet Take 0.25  mg by mouth at bedtime as needed for anxiety.    Marland Kitchen amiodarone (PACERONE) 200 MG tablet Take 1 tablet (200 mg total) by mouth 2 (two) times daily.    Marland Kitchen apixaban (ELIQUIS) 2.5 MG TABS tablet Take 1 tablet (2.5 mg total) by mouth 2 (two) times daily. 60 tablet   . diltiazem (TIAZAC) 180 MG 24 hr capsule Take 1 capsule (180 mg total) by mouth daily.    . famotidine (PEPCID) 20 MG tablet Take 1 tablet (20 mg total) by mouth at bedtime.    Marland Kitchen levothyroxine (SYNTHROID, LEVOTHROID) 50 MCG tablet Take 50 mcg by mouth daily before breakfast.    . midodrine (PROAMATINE) 5 MG tablet Take 1 tablet (5 mg total) by mouth 3 (three) times daily with meals.    . sertraline (ZOLOFT) 25 MG tablet Take 25 mg by mouth daily.      Results for orders placed or performed during the hospital encounter of 05/07/17 (from the past 48 hour(s))  Glucose, capillary     Status: Abnormal   Collection Time: 05/26/17 12:10 PM  Result Value Ref Range   Glucose-Capillary 112 (H) 65 - 99 mg/dL  Glucose, capillary     Status: None   Collection Time: 05/26/17  5:57 PM  Result Value Ref Range   Glucose-Capillary 76 65 - 99 mg/dL  Glucose, capillary     Status: Abnormal   Collection Time: 05/26/17  9:45 PM  Result Value Ref Range  Glucose-Capillary 107 (H) 65 - 99 mg/dL  Glucose, capillary     Status: None   Collection Time: 05/27/17  6:25 AM  Result Value Ref Range   Glucose-Capillary 95 65 - 99 mg/dL  APTT     Status: Abnormal   Collection Time: 05/27/17  6:48 AM  Result Value Ref Range   aPTT 69 (H) 24 - 36 seconds    Comment:        IF BASELINE aPTT IS ELEVATED, SUGGEST PATIENT RISK ASSESSMENT BE USED TO DETERMINE APPROPRIATE ANTICOAGULANT THERAPY.   CBC     Status: Abnormal   Collection Time: 05/27/17  6:48 AM  Result Value Ref Range   WBC 9.7 4.0 - 10.5 K/uL   RBC 2.69 (L) 3.87 - 5.11 MIL/uL   Hemoglobin 8.7 (L) 12.0 - 15.0 g/dL   HCT 28.0 (L) 36.0 - 46.0 %   MCV 104.1 (H) 78.0 - 100.0 fL   MCH 32.3 26.0 -  34.0 pg   MCHC 31.1 30.0 - 36.0 g/dL   RDW 24.1 (H) 11.5 - 15.5 %   Platelets 177 150 - 400 K/uL  Heparin level (unfractionated)     Status: Abnormal   Collection Time: 05/27/17  6:48 AM  Result Value Ref Range   Heparin Unfractionated 1.10 (H) 0.30 - 0.70 IU/mL    Comment:        IF HEPARIN RESULTS ARE BELOW EXPECTED VALUES, AND PATIENT DOSAGE HAS BEEN CONFIRMED, SUGGEST FOLLOW UP TESTING OF ANTITHROMBIN III LEVELS.   Glucose, capillary     Status: Abnormal   Collection Time: 05/27/17 11:59 AM  Result Value Ref Range   Glucose-Capillary 105 (H) 65 - 99 mg/dL  Glucose, capillary     Status: Abnormal   Collection Time: 05/27/17  4:44 PM  Result Value Ref Range   Glucose-Capillary 110 (H) 65 - 99 mg/dL  MRSA PCR Screening     Status: None   Collection Time: 05/27/17  7:08 PM  Result Value Ref Range   MRSA by PCR NEGATIVE NEGATIVE    Comment:        The GeneXpert MRSA Assay (FDA approved for NASAL specimens only), is one component of a comprehensive MRSA colonization surveillance program. It is not intended to diagnose MRSA infection nor to guide or monitor treatment for MRSA infections.   Glucose, capillary     Status: Abnormal   Collection Time: 05/27/17  8:51 PM  Result Value Ref Range   Glucose-Capillary 114 (H) 65 - 99 mg/dL  CBC     Status: Abnormal   Collection Time: 05/28/17  4:59 AM  Result Value Ref Range   WBC 10.2 4.0 - 10.5 K/uL   RBC 2.60 (L) 3.87 - 5.11 MIL/uL   Hemoglobin 8.4 (L) 12.0 - 15.0 g/dL   HCT 27.3 (L) 36.0 - 46.0 %   MCV 105.0 (H) 78.0 - 100.0 fL   MCH 32.3 26.0 - 34.0 pg   MCHC 30.8 30.0 - 36.0 g/dL   RDW 24.3 (H) 11.5 - 15.5 %   Platelets 190 150 - 400 K/uL  Heparin level (unfractionated)     Status: Abnormal   Collection Time: 05/28/17  4:59 AM  Result Value Ref Range   Heparin Unfractionated 0.74 (H) 0.30 - 0.70 IU/mL    Comment:        IF HEPARIN RESULTS ARE BELOW EXPECTED VALUES, AND PATIENT DOSAGE HAS BEEN CONFIRMED, SUGGEST  FOLLOW UP TESTING OF ANTITHROMBIN III LEVELS.   APTT  Status: Abnormal   Collection Time: 05/28/17  4:59 AM  Result Value Ref Range   aPTT 62 (H) 24 - 36 seconds    Comment:        IF BASELINE aPTT IS ELEVATED, SUGGEST PATIENT RISK ASSESSMENT BE USED TO DETERMINE APPROPRIATE ANTICOAGULANT THERAPY.   Glucose, capillary     Status: None   Collection Time: 05/28/17  5:32 AM  Result Value Ref Range   Glucose-Capillary 90 65 - 99 mg/dL  Glucose, capillary     Status: None   Collection Time: 05/28/17  8:09 AM  Result Value Ref Range   Glucose-Capillary 86 65 - 99 mg/dL  I-STAT 4, (NA,K, GLUC, HGB,HCT)     Status: Abnormal   Collection Time: 05/28/17  8:51 AM  Result Value Ref Range   Sodium 136 135 - 145 mmol/L   Potassium 4.0 3.5 - 5.1 mmol/L   Glucose, Bld 81 65 - 99 mg/dL   HCT 27.0 (L) 36.0 - 46.0 %   Hemoglobin 9.2 (L) 12.0 - 15.0 g/dL   No results found.  Review of Systems  Constitutional: Negative.   HENT: Negative.   Eyes: Negative.   Respiratory: Negative.   Cardiovascular: Negative.   Gastrointestinal: Negative.   Genitourinary: Negative.   Musculoskeletal: Negative.   Skin: Negative.   Neurological: Negative.   Psychiatric/Behavioral: Negative.     Blood pressure 118/80, pulse (!) 122, temperature 98.8 F (37.1 C), temperature source Oral, resp. rate (!) 22, height 5\' 5"  (1.651 m), weight 67 kg (147 lb 11.3 oz), SpO2 95 %. Physical Exam  Constitutional: She is oriented to person, place, and time. She appears well-developed and well-nourished.  HENT:  Head: Normocephalic and atraumatic.  Eyes: Pupils are equal, round, and reactive to light. EOM are normal.  Cardiovascular: Normal rate.   Respiratory: Effort normal.  GI: Soft.  Musculoskeletal:       Legs: Neurological: She is alert and oriented to person, place, and time.  Skin: Skin is warm.  Psychiatric: She has a normal mood and affect. Her behavior is normal. Judgment and thought content normal.       Assessment/Plan Plan debridement of left leg with Acell and VAC placement.  Wallace Going, DO 05/28/2017, 9:06 AM

## 2017-05-28 NOTE — Anesthesia Procedure Notes (Signed)
Procedure Name: Intubation Date/Time: 05/28/2017 9:27 AM Performed by: Everlean Cherry A Pre-anesthesia Checklist: Patient identified, Emergency Drugs available, Suction available and Patient being monitored Patient Re-evaluated:Patient Re-evaluated prior to induction Oxygen Delivery Method: Circle system utilized Preoxygenation: Pre-oxygenation with 100% oxygen Induction Type: IV induction Ventilation: Mask ventilation without difficulty Laryngoscope Size: Miller and 2 Grade View: Grade II Tube type: Oral Tube size: 7.0 mm Number of attempts: 1 Airway Equipment and Method: Stylet Placement Confirmation: ETT inserted through vocal cords under direct vision,  positive ETCO2 and breath sounds checked- equal and bilateral Secured at: 23 cm Tube secured with: Tape Dental Injury: Teeth and Oropharynx as per pre-operative assessment

## 2017-05-28 NOTE — Op Note (Signed)
DATE OF OPERATION: 05/28/2017  [facility]  PREOPERATIVE DIAGNOSIS: Left leg wound x 2   POSTOPERATIVE DIAGNOSIS: Same  PROCEDURE: Excisional debridement of left leg wound proximal 6 x 10 cm, distal 5 x 12 cm with placement of Acell (1 gm and 10 x 15 sheet) and VAC.  SURGEON: Claire Sanger Dillingham, DO  ASSISTANT: Shawn Rayburn, PA  EBL: 10 cc  CONDITION: Stable  COMPLICATIONS: None  INDICATION: The patient, [name], is a 66 y.o. female born on [birth year], is here for treatment of a left leg wound.  She has multiple medical problems including a fib, hypertension and renal failure.   PROCEDURE DETAILS:  The patient was seen prior to surgery and marked.  The IV antibiotics were given. The patient was taken to the operating room and given a general anesthetic. A standard time out was performed and all information was confirmed by those in the room. SCD was placed on the right leg.   The left leg was irrigated with antibiotic solution and saline.    Proximal: The #10 blade and bovie were used to debride the 6 x 10 cm wound and escar of nonviable skin and soft tissue.  Hemostasis was achieved with electrocautery. The Acell powder 500 mg was applied to the area and the sheet to cover the 6 x 10 cm area.  The sheet was sutured in place with 5-0 Vicryl.  The Sorbact was applied and secured with the 5-0 Vicryl.   Distal:  The #10 blade and bovie were used to debride the 5 x 12 cm wound and escar of nonviable skin and soft tissue.  Hemostasis was achieved with electrocautery.  The Acell powder 500 mg was applied to the area.  The rest of the Acell sheet was applied to the leg and sutured in place with 5-0 Vicryl.  The Sorbact was applied and secured with the 5-0 Vicryl.  The KY gel and VAC was placed and there was an excellent seal.  The patient was allowed to wake up and taken to recovery room in stable condition at the end of the case. The family was notified at  the end of the case.

## 2017-05-28 NOTE — Progress Notes (Signed)
ANTICOAGULATION CONSULT NOTE - Follow Up Consult  Pharmacy Consult for heparin Indication: atrial fibrillation  No Known Allergies  Patient Measurements: Height: 5\' 5"  (165.1 cm) Weight: 147 lb 11.3 oz (67 kg) IBW/kg (Calculated) : 57 Heparin Dosing Weight:   Vital Signs: Temp: 97.7 F (36.5 C) (08/20 1430) Temp Source: Oral (08/20 1430) BP: 101/69 (08/20 1430) Pulse Rate: 100 (08/20 1430)  Labs:  Recent Labs  05/26/17 0506 05/27/17 0648 05/28/17 0459 05/28/17 0851 05/28/17 1925  HGB 8.5* 8.7* 8.4* 9.2*  --   HCT 27.0* 28.0* 27.3* 27.0*  --   PLT 186 177 190  --   --   APTT 71* 69* 62*  --  54*  HEPARINUNFRC 1.78* 1.10* 0.74*  --  0.65    Estimated Creatinine Clearance: 15.8 mL/min (A) (by C-G formula based on SCr of 3.2 mg/dL (H)).   Medications:  Scheduled:  . amiodarone  200 mg Oral BID  . darbepoetin (ARANESP) injection - DIALYSIS  60 mcg Intravenous Q Wed-HD  . [START ON 05/29/2017] diltiazem  180 mg Oral QHS  . famotidine  20 mg Oral QHS  . feeding supplement (PRO-STAT SUGAR FREE 64)  30 mL Oral BID  . hydrocerin   Topical BID  . levothyroxine  100 mcg Oral QAC breakfast  . megestrol  40 mg Oral BID  . midodrine  10 mg Oral TID WC  . multivitamin  1 tablet Oral QHS  . QUEtiapine  25 mg Oral QHS  . senna-docusate  2 tablet Oral QHS  . sertraline  25 mg Oral QHS   Infusions:  . heparin 650 Units/hr (05/28/17 1130)    Assessment: 66 yo female with afib is currently on heparin.   S/p I&D this morning, heparin resumed.  Heparin level tonight is 0.65 and aPTT is 54.  It appears heparin level is correlated so will rely on that value for rate titration purposes. No issues noted this afternoon.   Goal of Therapy:  Heparin level 0.3-0.7 Monitor platelets by anticoagulation protocol: Yes   Plan:  - Heparin level appears to have correlated and continues to be at goal - continue heparin at 650 units/hr. - daily heparin level and CBC  Erin Hearing  PharmD., BCPS Clinical Pharmacist Pager [phone] 05/28/2017 8:55 PM

## 2017-05-28 NOTE — Anesthesia Preprocedure Evaluation (Deleted)
Anesthesia Evaluation  Patient identified by MRN, date of birth, ID band Patient awake    Reviewed: Allergy & Precautions, NPO status , Patient's Chart, lab work & pertinent test results  History of Anesthesia Complications Negative for: history of anesthetic complications  Airway Mallampati: II  TM Distance: >3 FB Neck ROM: Full    Dental  (+) Chipped, Poor Dentition, Loose,    Pulmonary  Stable s/p intubation for septic shock a month ago   breath sounds clear to auscultation       Cardiovascular hypertension, Pt. on medications (-) angina+ dysrhythmias Atrial Fibrillation  Rhythm:Irregular Rate:Normal     Neuro/Psych negative neurological ROS     GI/Hepatic negative GI ROS, Neg liver ROS,   Endo/Other  Hypothyroidism   Renal/GU ESRF and DialysisRenal disease     Musculoskeletal   Abdominal   Peds  Hematology  (+) Blood dyscrasia (Hb 9.2), anemia ,   Anesthesia Other Findings   Reproductive/Obstetrics                            Anesthesia Physical Anesthesia Plan  ASA: IV  Anesthesia Plan: General   Post-op Pain Management:    Induction: Intravenous  PONV Risk Score and Plan: 3 and Ondansetron, Dexamethasone, Midazolam and Treatment may vary due to age or medical condition  Airway Management Planned: Oral ETT  Additional Equipment:   Intra-op Plan:   Post-operative Plan: Extubation in OR  Informed Consent: I have reviewed the patients History and Physical, chart, labs and discussed the procedure including the risks, benefits and alternatives for the proposed anesthesia with the patient or authorized representative who has indicated his/her understanding and acceptance.   Dental advisory given  Plan Discussed with: CRNA, Anesthesiologist and Surgeon  Anesthesia Plan Comments: (Plan routine monitors, GETA)      Anesthesia Quick Evaluation

## 2017-05-28 NOTE — Progress Notes (Signed)
Nutrition Follow-up  DOCUMENTATION CODES:   Not applicable  INTERVENTION:  Continue 30 ml Prostat po BID, each supplement provides 100 kcal and 15 grams of protein.   Encourage adequate PO intake.   NUTRITION DIAGNOSIS:   Increased nutrient needs related to chronic illness as evidenced by estimated needs; ongoing  GOAL:   Patient will meet greater than or equal to 90% of their needs; progressing  MONITOR:   PO intake, Supplement acceptance, Labs, Weight trends, Skin, I & O's  REASON FOR ASSESSMENT:   Consult Diet education  ASSESSMENT:   66 y.o. female with history of HTN, A fib-on pradaxa who was admitted on [facility] on 04/08/17 due to BLE cellulitis with difficulty walking. She was found to be septic with leucocytosis and hypotensive. CRRT initiated and  hospital course significant for PEA arrest 7/4, ARDS, anuria requiring  IJ placement by vascular surgery as now HD dependent, shocked liver with abnormal LFTs, thrombocytopenia, ongoing issues with hypotension and tachycardia. 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  Procedure (8/20): IRRIGATION AND DEBRIDEMENT LEFT LOWER LEG WOUND (Left) APPLICATION OF A-CELL OF LEFT LOWER EXTREMITY (Left) APPLICATION OF WOUND VAC (Left)  Pt in unavailable, in OR, during attempted time of visit. Meal completion has been 50-100%. Pt currently has Prostat ordered. RD to continue with current orders to aid in adequate nutrition. Labs and medications reviewed.   Diet Order:  Diet regular Room service appropriate? Yes; Fluid consistency: Thin; Fluid restriction: 1200 mL Fluid  Skin:  Wound (see comment) (Incision on L leg with wound VAC)  Last BM:  8/19  Height:   Ht Readings from Last 1 Encounters:  05/07/17 5\' 5"  (1.651 m)    Weight:   Wt Readings from Last 1 Encounters:  05/28/17 147 lb 11.3 oz (67 kg)    Ideal Body Weight:  56.8 kg  BMI:  Body mass index is 24.58  kg/m.  Estimated Nutritional Needs:   Kcal:  1800-2000  Protein:  90-110 grams  Fluid:  1.2 L/day  EDUCATION NEEDS:   Education needs addressed  Corrin Parker, MS, RD, LDN Pager # [phone] After hours/ weekend pager # [phone]

## 2017-05-28 NOTE — Progress Notes (Signed)
Subjective: Interval History: has no complaint, does not remember procedure this am.  Objective: Vital signs in last 24 hours: Temp:  [97.5 F (36.4 C)-98.8 F (37.1 C)] 97.6 F (36.4 C) (08/20 1122) Pulse Rate:  [101-122] 103 (08/20 1122) Resp:  [8-22] 17 (08/20 1122) BP: (99-118)/(65-93) 107/65 (08/20 1122) SpO2:  [92 %-99 %] 96 % (08/20 1122) Weight:  [67 kg (147 lb 11.3 oz)] 67 kg (147 lb 11.3 oz) (08/20 0545) Weight change: 3.7 kg (8 lb 2.5 oz)  Intake/Output from previous day: 08/19 0701 - 08/20 0700 In: 497.4 [P.O.:340; I.V.:157.4] Out: -  Intake/Output this shift: Total I/O In: 300 [I.V.:300] Out: 10 [Blood:10]  General appearance: alert, cooperative, no distress and pale Resp: diminished breath sounds bilaterally Chest wall: RIJ PC Cardio: irregularly irregular rhythm and systolic murmur: holosystolic 2/6, blowing at apex GI: soft, non-tender; bowel sounds normal; no masses,  no organomegaly Extremities: dressing LL leg  Lab Results:  Recent Labs  05/27/17 0648 05/28/17 0459 05/28/17 0851  WBC 9.7 10.2  --   HGB 8.7* 8.4* 9.2*  HCT 28.0* 27.3* 27.0*  PLT 177 190  --    BMET:  Recent Labs  05/28/17 0851  NA 136  K 4.0  GLUCOSE 81   No results for input(s): PTH in the last 72 hours. Iron Studies: No results for input(s): IRON, TIBC, TRANSFERRIN, FERRITIN in the last 72 hours.  Studies/Results: No results found.  I have reviewed the patient's current medications.  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

## 2017-05-28 NOTE — Progress Notes (Signed)
Nurse from [facility], states that patient still has heparin drip running.  Notified Dr. [name] and received verbal order to stop Heparin drip.  Stacey notified and verbalized understanding.

## 2017-05-28 NOTE — Progress Notes (Signed)
Report given to jena rn as caregiver 

## 2017-05-28 NOTE — Anesthesia Postprocedure Evaluation (Signed)
Anesthesia Post Note  Patient: [name]  Procedure(s) Performed: Procedure(s) (LRB): IRRIGATION AND DEBRIDEMENT LEFT LOWER LEG WOUND (Left) APPLICATION OF A-CELL OF LEFT LOWER EXTREMITY (Left) APPLICATION OF WOUND VAC (Left)     Patient location during evaluation: PACU Anesthesia Type: General Level of consciousness: awake and alert, patient cooperative and oriented Pain management: pain level controlled Vital Signs Assessment: post-procedure vital signs reviewed and stable Respiratory status: spontaneous breathing, nonlabored ventilation, respiratory function stable and patient connected to nasal cannula oxygen Cardiovascular status: blood pressure returned to baseline and stable Postop Assessment: no signs of nausea or vomiting Anesthetic complications: no    Last Vitals:  Vitals:   05/28/17 1103 05/28/17 1122  BP: 99/79 107/65  Pulse: (!) 112 (!) 103  Resp: 18 17  Temp:  36.4 C  SpO2: 92% 96%    Last Pain:  Vitals:   05/28/17 1147  TempSrc:   PainSc: 0-No pain                 Estel Tonelli,E. Eveleigh Crumpler

## 2017-05-29 ENCOUNTER — Inpatient Hospital Stay (HOSPITAL_COMMUNITY): Payer: Medicare Other | Admitting: Physical Therapy

## 2017-05-29 ENCOUNTER — Encounter (HOSPITAL_COMMUNITY): Payer: Self-pay | Admitting: Plastic Surgery

## 2017-05-29 ENCOUNTER — Inpatient Hospital Stay (HOSPITAL_COMMUNITY): Payer: Medicare Other

## 2017-05-29 ENCOUNTER — Inpatient Hospital Stay (HOSPITAL_COMMUNITY): Payer: Medicare Other | Admitting: Occupational Therapy

## 2017-05-29 LAB — CBC
HEMATOCRIT: 26.8 % — AB (ref 36.0–46.0)
HEMATOCRIT: 27.2 % — AB (ref 36.0–46.0)
HEMOGLOBIN: 8.2 g/dL — AB (ref 12.0–15.0)
HEMOGLOBIN: 8.4 g/dL — AB (ref 12.0–15.0)
MCH: 32.5 pg (ref 26.0–34.0)
MCH: 32.8 pg (ref 26.0–34.0)
MCHC: 30.6 g/dL (ref 30.0–36.0)
MCHC: 30.9 g/dL (ref 30.0–36.0)
MCV: 106.3 fL — ABNORMAL HIGH (ref 78.0–100.0)
MCV: 106.3 fL — AB (ref 78.0–100.0)
Platelets: 192 10*3/uL (ref 150–400)
Platelets: 205 10*3/uL (ref 150–400)
RBC: 2.52 MIL/uL — AB (ref 3.87–5.11)
RBC: 2.56 MIL/uL — AB (ref 3.87–5.11)
RDW: 24.3 % — ABNORMAL HIGH (ref 11.5–15.5)
RDW: 24.4 % — ABNORMAL HIGH (ref 11.5–15.5)
WBC: 9.4 10*3/uL (ref 4.0–10.5)
WBC: 8.6 10*3/uL (ref 4.0–10.5)

## 2017-05-29 LAB — RENAL FUNCTION PANEL
ANION GAP: 13 (ref 5–15)
Albumin: 3 g/dL — ABNORMAL LOW (ref 3.5–5.0)
BUN: 46 mg/dL — ABNORMAL HIGH (ref 6–20)
CHLORIDE: 100 mmol/L — AB (ref 101–111)
CO2: 23 mmol/L (ref 22–32)
Calcium: 8.9 mg/dL (ref 8.9–10.3)
Creatinine, Ser: 4.31 mg/dL — ABNORMAL HIGH (ref 0.44–1.00)
GFR, EST AFRICAN AMERICAN: 11 mL/min — AB (ref >60)
GFR, EST NON AFRICAN AMERICAN: 10 mL/min — AB (ref >60)
Glucose, Bld: 123 mg/dL — ABNORMAL HIGH (ref 65–99)
Phosphorus: 3.9 mg/dL (ref 2.5–4.6)
Potassium: 4.6 mmol/L (ref 3.5–5.1)
Sodium: 136 mmol/L (ref 135–145)

## 2017-05-29 LAB — GLUCOSE, CAPILLARY
GLUCOSE-CAPILLARY: 103 mg/dL — AB (ref 65–99)
Glucose-Capillary: 92 mg/dL (ref 65–99)
Glucose-Capillary: 125 mg/dL — ABNORMAL HIGH (ref 65–99)

## 2017-05-29 LAB — IRON AND TIBC
Iron: 51 ug/dL (ref 28–170)
SATURATION RATIOS: 18 % (ref 10.4–31.8)
TIBC: 279 ug/dL (ref 250–450)
UIBC: 228 ug/dL

## 2017-05-29 LAB — HEPARIN LEVEL (UNFRACTIONATED): Heparin Unfractionated: 0.56 IU/mL (ref 0.30–0.70)

## 2017-05-29 MED ORDER — LIDOCAINE HCL (PF) 1 % IJ SOLN: 5.0000 mL | INTRAMUSCULAR | Status: DC | PRN

## 2017-05-29 MED ORDER — LIDOCAINE-PRILOCAINE 2.5-2.5 % EX CREA: 1.0000 "application " | TOPICAL_CREAM | CUTANEOUS | Status: DC | PRN

## 2017-05-29 MED ORDER — SODIUM CHLORIDE 0.9 % IV SOLN: 100.0000 mL | INTRAVENOUS | Status: DC | PRN

## 2017-05-29 MED ORDER — HEPARIN SODIUM (PORCINE) 1000 UNIT/ML DIALYSIS: 1000.0000 [IU] | INTRAMUSCULAR | Status: DC | PRN

## 2017-05-29 MED ORDER — ALTEPLASE 2 MG IJ SOLR: 2.0000 mg | Freq: Once | INTRAMUSCULAR | Status: DC | PRN

## 2017-05-29 MED ORDER — HEPARIN SODIUM (PORCINE) 1000 UNIT/ML DIALYSIS
40.0000 [IU]/kg | Freq: Once | INTRAMUSCULAR | Status: AC
  Administered 2017-05-29: 2700 [IU] via INTRAVENOUS_CENTRAL

## 2017-05-29 MED ORDER — APIXABAN 2.5 MG PO TABS
2.5000 mg | ORAL_TABLET | Freq: Two times a day (BID) | ORAL | Status: DC
  Administered 2017-05-29 – 2017-05-30 (×2): 2.5 mg via ORAL
  Filled 2017-05-29 (×2): qty 1

## 2017-05-29 MED ORDER — DARBEPOETIN ALFA 60 MCG/0.3ML IJ SOSY: 60.0000 ug | PREFILLED_SYRINGE | INTRAMUSCULAR | Status: DC

## 2017-05-29 MED ORDER — PENTAFLUOROPROP-TETRAFLUOROETH EX AERO: 1.0000 "application " | INHALATION_SPRAY | CUTANEOUS | Status: DC | PRN

## 2017-05-29 NOTE — Progress Notes (Signed)
Physical Therapy Session Note  Patient Details  Name: [name] MRN: [id number] Date of Birth: Nov 14, 1950  Today's Date: 05/29/2017 PT Individual Time: 1030-1130 PT Individual Time Calculation (min): 60 min   Short Term Goals: Week 3:  PT Short Term Goal 1 (Week 3): pt will perform stand pivot transfer with mod A PT Short Term Goal 2 (Week 3): pt will propel w/c with supervision 25' in controlled environment  Skilled Therapeutic Interventions/Progress Updates:    Pt seated in w/c upon PT arrival, agreeable to therapy tx. Session focused on sit<>stand training and pregait. Pt transferred from w/c to mat with max assist+2 to stand, mod assist to stand pivot with RW. Prior to transfer pt with x 2 unsuccessful attempts to come to standing. Worked on sit<>stands from heightened surface 25 inches,  x 8 with Max assist. In standing worked on mini squats x3-5 with verbal cues to keep trunk and head up. In standing performed pre gait stepping forward and backwards with each foot x 3, min assist with RW. Pt attempted to take steps for ambulation but had an uncontrolled decent to mat, stating "I can't do it." Pt transported back to room total A, left in w/c in room with needs in reach.   Therapy Documentation Precautions:  Precautions Precautions: Fall Restrictions Weight Bearing Restrictions: No Pain: Pain Assessment Pain Assessment: No/denies pain   See Function Navigator for Current Functional Status.   Therapy/Group: Individual Therapy  Netta Corrigan, PT, DPT 05/29/2017, 12:04 PM

## 2017-05-29 NOTE — Progress Notes (Addendum)
Physical Therapy Note  Patient Details  Name: [name] MRN: [id number] Date of Birth: June 27, 1951 Today's Date: 05/29/2017    Time: [phone] 30 minutes  1:1 No c/o pain.  Session focused on sit to stands and standing tolerance.  Pt wearing XL hospital sock over bandage on Lt foot.  Sit to stand x 5 with mod/max A, cues for UE placement each attempt.  Pt able to stand for up to 3 mins with wt shifts, no increased pain in Lt foot with standing.  Time: 1345-1430 45 minutes  1:1 No c/o pain.  Attempted gait training with 7 x sit to stand with max A, pt able to advance Lt LE but unable to advance Rt LE despite multiple attempts and max A for wt shifts.  Standing balance with reaching and horseshoe game with pt able to stand 45 seconds - 60 seconds before limited by fatigue.  Squat pivot transfers with max A w/c <> nustep.  Nustep x 6 minutes level 4 for UE/LE strengthening.   [name] 05/29/2017, 10:13 AM

## 2017-05-29 NOTE — Progress Notes (Signed)
Orthopedic Tech Progress Note Patient Details:  Marai Teehan Aug 17, 1951 [id number]  Ortho Devices Type of Ortho Device: Postop shoe/boot Ortho Device/Splint Interventions: Application   [place] Pink 05/29/2017, 1:36 PM

## 2017-05-29 NOTE — Progress Notes (Signed)
ANTICOAGULATION CONSULT NOTE - Follow Up Consult  Pharmacy Consult for heparin Indication: atrial fibrillation  No Known Allergies  Patient Measurements: Height: 5\' 5"  (165.1 cm) Weight: 144 lb 6.4 oz (65.5 kg) IBW/kg (Calculated) : 57 Heparin Dosing Weight:   Vital Signs: Temp: 98.7 F (37.1 C) (08/21 0451) Temp Source: Oral (08/21 0451) BP: 103/68 (08/21 0451) Pulse Rate: 106 (08/21 0451)  Labs:  Recent Labs  05/27/17 8242 05/28/17 0459 05/28/17 0851 05/28/17 1925 05/29/17 0459  HGB 8.7* 8.4* 9.2*  --  8.2*  HCT 28.0* 27.3* 27.0*  --  26.8*  PLT 177 190  --   --  192  APTT 69* 62*  --  54*  --   HEPARINUNFRC 1.10* 0.74*  --  0.65 0.56    Estimated Creatinine Clearance: 15.8 mL/min (A) (by C-G formula based on SCr of 3.2 mg/dL (H)).   Medications:  Scheduled:  . amiodarone  200 mg Oral BID  . [START ON 05/31/2017] darbepoetin (ARANESP) injection - DIALYSIS  60 mcg Intravenous Q Thu-HD  . diltiazem  180 mg Oral QHS  . famotidine  20 mg Oral QHS  . feeding supplement (PRO-STAT SUGAR FREE 64)  30 mL Oral BID  . hydrocerin   Topical BID  . levothyroxine  100 mcg Oral QAC breakfast  . megestrol  40 mg Oral BID  . midodrine  10 mg Oral TID WC  . multivitamin  1 tablet Oral QHS  . QUEtiapine  25 mg Oral QHS  . senna-docusate  2 tablet Oral QHS  . sertraline  25 mg Oral QHS   Infusions:  . heparin 650 Units/hr (05/28/17 1130)    Assessment: 66 yo female with afib is currently on heparin while Eliquis is on hold  S/p I&D 8/20, heparin resumed.  Heparin level this morning is 0.56 on 650 units/hr.  hgb low stable, Pltc wnl. No bleeding noted per chart. Per MD note, continue heparin gtt, discuss with plastics on restarting eliquis  Goal of Therapy:  Heparin level 0.3-0.7 Monitor platelets by anticoagulation protocol: Yes   Plan:  - continue heparin at 650 units/hr. - daily heparin level and CBC - f/u plans for restarting eliquis  Maryanna Shape, PharmD, BCPS   Clinical Pharmacist  Pager: [phone]   05/29/2017 11:41 AM

## 2017-05-29 NOTE — Progress Notes (Signed)
Occupational Therapy Session Note  Patient Details  Name: [name] MRN: [id number] Date of Birth: 1950-12-21  Today's Date: 05/29/2017 OT Individual Time: 3151-7616 OT Individual Time Calculation (min): 57 min    Short Term Goals: Week 3:  OT Short Term Goal 1 (Week 3): Pt will demonstrate ability to pull pants over hips at sit > stand level with mod assist for standing balance OT Short Term Goal 2 (Week 3): Pt will complete hygiene post toileting with mod assist for standing balance with use of AE PRN OT Short Term Goal 3 (Week 3): Pt will complete toilet transfers with min assist  Skilled Therapeutic Interventions/Progress Updates:    Treatment session with focus on self-care retraining at bed level with focus on increased BUE functional use.  Pt with decreased recall of techniques with LB bathing and dressing, requiring cues for sequencing and technique to achieve modified circle sitting position.  Pt with decreased rolling to Lt to complete LB dressing requiring physical assistance.  Slide board transfer to w/c with increased cues for sequencing and head/hips relationship during transfer and mod-max assist due to fatigue and decreased initiation.  Grooming tasks completed at sink with increased time and setup to obtain items.   Therapy Documentation Precautions:  Precautions Precautions: Fall Restrictions Weight Bearing Restrictions: No Pain: Pain Assessment Pain Assessment: No/denies pain  See Function Navigator for Current Functional Status.   Therapy/Group: Individual Therapy  [name] 05/29/2017, 9:56 AM

## 2017-05-29 NOTE — Plan of Care (Signed)
Problem: RH Bed Mobility Goal: LTG Patient will perform bed mobility with assist (PT) LTG: Patient will perform bed mobility with assistance, with/without cues (PT).  Downgraded due to slow progress  Problem: RH Ambulation Goal: LTG Patient will ambulate in controlled environment (PT) LTG: Patient will ambulate in a controlled environment, # of feet with assistance (PT).  Downgrade due to slow progress  Problem: RH Stairs Goal: LTG Patient will ambulate up and down stairs w/assist (PT) LTG: Patient will ambulate up and down # of stairs with assistance (PT)  Outcome: Not Applicable Date Met: 29/02/11 N/a due to pt's family installing ramp

## 2017-05-29 NOTE — Progress Notes (Signed)
Subjective: Interval History: has no complaint.  Objective: Vital signs in last 24 hours: Temp:  [97.7 F (36.5 C)-98.7 F (37.1 C)] 98.7 F (37.1 C) (08/21 0451) Pulse Rate:  [100-106] 106 (08/21 0451) Resp:  [17-18] 17 (08/21 0451) BP: (101-103)/(68-69) 103/68 (08/21 0451) SpO2:  [96 %-98 %] 96 % (08/21 0451) Weight:  [65.5 kg (144 lb 6.4 oz)] 65.5 kg (144 lb 6.4 oz) (08/21 0451) Weight change: -1.5 kg (-3 lb 4.9 oz)  Intake/Output from previous day: 08/20 0701 - 08/21 0700 In: 946.3 [P.O.:600; I.V.:346.3] Out: 10 [Blood:10] Intake/Output this shift: Total I/O In: 200 [P.O.:200] Out: -   General appearance: alert, cooperative and no distress Resp: clear to auscultation bilaterally Chest wall: RIJ PC Cardio: S1, S2 normal and systolic murmur: holosystolic 2/6, blowing at apex GI: pos bs, liver down 5 cm Extremities: edema bandage on L, 1 +edema  Lab Results:  Recent Labs  05/28/17 0459 05/28/17 0851 05/29/17 0459  WBC 10.2  --  9.4  HGB 8.4* 9.2* 8.2*  HCT 27.3* 27.0* 26.8*  PLT 190  --  192   BMET:  Recent Labs  05/28/17 0851  NA 136  K 4.0  GLUCOSE 81   No results for input(s): PTH in the last 72 hours. Iron Studies: No results for input(s): IRON, TIBC, TRANSFERRIN, FERRITIN in the last 72 hours.  Studies/Results: No results found.  I have reviewed the patient's current medications.  Assessment/Plan: 1 ESRD for HD 2 CM  3 Cellulitis with necrosis 4 PAF 5 Anemia esa 6 HPTH P HD, esa, rehab,    LOS: 22 days   [name] L 05/29/2017,12:35 PM

## 2017-05-29 NOTE — Progress Notes (Signed)
[place] PHYSICAL MEDICINE & REHABILITATION     PROGRESS NOTE  Subjective/Complaints:  Had a fair night. Pain controlled this morning  ROS: pt denies nausea, vomiting, diarrhea, cough, shortness of breath or chest pain    Objective: Vital Signs: Blood pressure 103/68, pulse (!) 106, temperature 98.7 F (37.1 C), temperature source Oral, resp. rate 17, height 5\' 5"  (1.651 m), weight 65.5 kg (144 lb 6.4 oz), SpO2 96 %. No results found.  Recent Labs  05/28/17 0459 05/28/17 0851 05/29/17 0459  WBC 10.2  --  9.4  HGB 8.4* 9.2* 8.2*  HCT 27.3* 27.0* 26.8*  PLT 190  --  192    Recent Labs  05/28/17 0851  NA 136  K 4.0  GLUCOSE 81   CBG (last 3)   Recent Labs  05/28/17 1708 05/28/17 2123 05/29/17 0708  GLUCAP 84 128* 92    Wt Readings from Last 3 Encounters:  05/29/17 65.5 kg (144 lb 6.4 oz)  05/07/17 75.3 kg (166 lb 0.1 oz)    Physical Exam:  BP 103/68 (BP Location: Right Arm)   Pulse (!) 106   Temp 98.7 F (37.1 C) (Oral)   Resp 17   Ht 5\' 5"  (1.651 m)   Wt 65.5 kg (144 lb 6.4 oz)   SpO2 96%   BMI 24.03 kg/m  Constitutional: She appears well-developed and well-nourished.  HENT: Normocephalic. Atraumatic. Eyes: EOMI. No discharge.  Cardiovascular: IRR/IRR Respiratory: CTA Bilaterally without wheezes or rales. Normal effort  .  GI: Soft. Bowel sounds are normal.   Musculoskeletal: She exhibits edema and tenderness.  Neurological: She is alert and oriented.  Able to follow commands without difficulty.  Motor: B/l UE 4/5 proximal to distal RLE: HF 3/5, KE 3+/5, ADF/PF 4/5  LLE: HF 3-/5, KE 3/5, ADF/PF 3+/5 (stable) Skin: LLE with vac. Psychiatric: She has a normal mood and affect. Her behavior is normal. Thought content normal.    Assessment/Plan: 1. 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 Physiatrist is providing close team supervision and 24 hour  management of active medical problems listed below. Physiatrist and rehab team continue to assess barriers to discharge/monitor patient progress toward functional and medical goals.  Function:  Bathing Bathing position   Position: Bed  Bathing parts Body parts bathed by patient: Right arm, Left arm, Chest, Abdomen, Right upper leg, Left upper leg, Right lower leg Body parts bathed by helper: Front perineal area, Buttocks, Back  Bathing assist Assist Level: Touching or steadying assistance(Pt > 75%)      Upper Body Dressing/Undressing Upper body dressing   What is the patient wearing?: Pull over shirt/dress     Pull over shirt/dress - Perfomed by patient: Put head through opening, Thread/unthread left sleeve, Thread/unthread right sleeve Pull over shirt/dress - Perfomed by helper: Pull shirt over trunk        Upper body assist Assist Level: Touching or steadying assistance(Pt > 75%)   Set up : To obtain clothing/put away  Lower Body Dressing/Undressing Lower body dressing   What is the patient wearing?: Pants, Non-skid slipper socks     Pants- Performed by patient: Thread/unthread right pants leg Pants- Performed by helper: Thread/unthread left pants leg, Pull pants up/down Non-skid slipper socks- Performed by patient: Don/doff right sock Non-skid slipper socks- Performed by helper: Don/doff right sock                  Lower  body assist Assist for lower body dressing:  (Mod assist)      Toileting Toileting Toileting activity did not occur: N/A (anuric)   Toileting steps completed by helper: Adjust clothing prior to toileting, Performs perineal hygiene, Adjust clothing after toileting    Toileting assist Assist level: Two helpers (+2 for clothing managment only)   Transfers Chair/bed transfer   Chair/bed transfer method: Lateral scoot Chair/bed transfer assist level: Maximal assist (Pt 25 - 49%/lift and lower) Chair/bed transfer assistive device: Armrests,  Sliding board Mechanical lift: Stedy   Locomotion Ambulation Ambulation activity did not occur: Safety/medical concerns   Max distance: 13 Assist level: 2 helpers   Wheelchair   Type: Manual Max wheelchair distance: 10' Assist Level: Touching or steadying assistance (Pt > 75%)  Cognition Comprehension Comprehension assist level: Understands complex 90% of the time/cues 10% of the time  Expression Expression assist level: Expresses complex 90% of the time/cues < 10% of the time  Social Interaction Social Interaction assist level: Interacts appropriately with others with medication or extra time (anti-anxiety, antidepressant).  Problem Solving Problem solving assist level: Solves basic 90% of the time/requires cueing < 10% of the time  Memory Memory assist level: Requires cues to use assistive device    Medical Problem List and Plan: 1.  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  DVT Prophylaxis/Anticoagulation: Pharmaceutical: Other (comment)--Eliquis held prior to surgery.  heparin ggt continues. Discuss resumption of eliquis with plastics    3. Pain Management: tylenol prn 4. Mood: LCSW to follow for evaluation and support.  5. Neuropsych: This patient is capable of making decisions on her own behalf. 6. Skin/Wound Care: routine pressure relief measures. Prevalon boot for heel ulcer.  7. Fluids/Electrolytes/Nutrition: Strict I/Os.   8.  Hypotension: Monitor orthostatic BP. Added abdominal binder and TEDs to help with BP support.    Vitals:   05/28/17 1430 05/29/17 0451  BP: 101/69 103/68  Pulse: 100 (!) 106  Resp: 18 17  Temp: 97.7 F (36.5 C) 98.7 F (37.1 C)  SpO2: 98% 96%   Controlled 8/17 9. Paroxysmal Afib:  on eliquis for stroke prevention. Continue amiodarone bid   Cardizem increased to 180 on 8/16.   Cont to monitor 10. Leukocytosis: Resolved              Patient with ulcers--continue with WOC recs/daily dressing              Afebrile             Cont to monitor 11. Anemia of chronic disease: On epogen  MWF.              Hb 8.7 on 8/17             Cont to montior 12. Left foot/leg ulcer:   -continue dressing changes/wound care/vac   Debrided 8/6, again 8/13  S/p debridement/Acell placement to left leg wound/vac 8/20 by Dr. [name] 13. Anxiety/depression: On Seroquel and Zoloft at nights.  14. Prediabetes: CBG (last 3)   Recent Labs  05/28/17 1708 05/28/17 2123 05/29/17 0708  GLUCAP 84 128* 92   Relatively controlled   15. CKD now HD dependent: HD on MWF at end of day to help with tolerance of therapy. Renal diet--Reducate patient on appropriate diet.  16. Vaginal bleeding  Per PCP no issues prior  Gyn consulted, Megace initiated, transvaginal U/S showing endometrial thickening and mass.  Contacted Gyn, per recs, outpt  workup to rule out CA.  Improved at present 17. Abnormal UA  UA +, Ucx with multiple species 18. Vaginal discharge  Wet prep with many WBCs  Improved at present  LOS (Days) 22 A FACE TO FACE EVALUATION WAS PERFORMED  SWARTZ,ZACHARY T 05/29/2017 9:54 AM

## 2017-05-30 ENCOUNTER — Inpatient Hospital Stay (HOSPITAL_COMMUNITY): Payer: Medicare Other

## 2017-05-30 ENCOUNTER — Inpatient Hospital Stay (HOSPITAL_COMMUNITY): Payer: Medicare Other | Admitting: Occupational Therapy

## 2017-05-30 LAB — GLUCOSE, CAPILLARY
GLUCOSE-CAPILLARY: 138 mg/dL — AB (ref 65–99)
GLUCOSE-CAPILLARY: 85 mg/dL (ref 65–99)
GLUCOSE-CAPILLARY: 106 mg/dL — AB (ref 65–99)
Glucose-Capillary: 103 mg/dL — ABNORMAL HIGH (ref 65–99)

## 2017-05-30 LAB — PTH, INTACT AND CALCIUM
CALCIUM TOTAL (PTH): 8.7 mg/dL (ref 8.7–10.3)
PTH: 138 pg/mL — AB (ref 15–65)

## 2017-05-30 LAB — PROTIME-INR
INR: 1.49
Prothrombin Time: 18.2 seconds — ABNORMAL HIGH (ref 11.4–15.2)

## 2017-05-30 MED ORDER — SODIUM CHLORIDE 0.9 % IV SOLN
125.0000 mg | INTRAVENOUS | Status: DC
  Administered 2017-05-31 – 2017-06-02 (×2): 125 mg via INTRAVENOUS
  Filled 2017-05-30 (×4): qty 10

## 2017-05-30 MED ORDER — DARBEPOETIN ALFA 150 MCG/0.3ML IJ SOSY
150.0000 ug | PREFILLED_SYRINGE | INTRAMUSCULAR | Status: DC
  Administered 2017-05-31: 150 ug via INTRAVENOUS
  Filled 2017-05-30: qty 0.3

## 2017-05-30 MED ORDER — WARFARIN SODIUM 2.5 MG PO TABS
2.5000 mg | ORAL_TABLET | Freq: Once | ORAL | Status: AC
  Administered 2017-05-30: 2.5 mg via ORAL
  Filled 2017-05-30: qty 1

## 2017-05-30 MED ORDER — WARFARIN - PHARMACIST DOSING INPATIENT: Freq: Every day | Status: DC

## 2017-05-30 NOTE — Progress Notes (Signed)
ANTICOAGULATION CONSULT NOTE - Initial Consult  Pharmacy Consult for coumadin Indication: atrial fibrillation  No Known Allergies  Patient Measurements: Height: 5\' 5"  (165.1 cm) Weight: 144 lb 13.5 oz (65.7 kg) IBW/kg (Calculated) : 57 Heparin Dosing Weight:   Vital Signs: Temp: 98.6 F (37 C) (08/22 0445) Temp Source: Oral (08/22 0445) BP: 98/69 (08/22 0445) Pulse Rate: 114 (08/22 0445)  Labs:  Recent Labs  05/28/17 0459 05/28/17 0851 05/28/17 1925 05/29/17 0459 05/29/17 1614 05/29/17 1616  HGB 8.4* 9.2*  --  8.2*  --  8.4*  HCT 27.3* 27.0*  --  26.8*  --  27.2*  PLT 190  --   --  192  --  205  APTT 62*  --  54*  --   --   --   HEPARINUNFRC 0.74*  --  0.65 0.56  --   --   CREATININE  --   --   --   --  4.31*  --     Estimated Creatinine Clearance: 11.7 mL/min (A) (by C-G formula based on SCr of 4.31 mg/dL (H)).   Medical History: Past Medical History:  Diagnosis Date  . A-fib ([place])    on pradaxa  . Chronic kidney disease   . Dysrhythmia   . Hypertension   . Hypothyroidism   . Thyroid disease     Medications:  Scheduled:  . amiodarone  200 mg Oral BID  . [START ON 05/31/2017] darbepoetin (ARANESP) injection - DIALYSIS  150 mcg Intravenous Q Thu-HD  . famotidine  20 mg Oral QHS  . feeding supplement (PRO-STAT SUGAR FREE 64)  30 mL Oral BID  . hydrocerin   Topical BID  . levothyroxine  100 mcg Oral QAC breakfast  . megestrol  40 mg Oral BID  . midodrine  10 mg Oral TID WC  . multivitamin  1 tablet Oral QHS  . QUEtiapine  25 mg Oral QHS  . senna-docusate  2 tablet Oral QHS  . sertraline  25 mg Oral QHS  . warfarin  2.5 mg Oral ONCE-1800  . Warfarin - Pharmacist Dosing Inpatient   Does not apply q1800   Infusions:  . [START ON 05/31/2017] ferric gluconate (FERRLECIT/NULECIT) IV      Assessment: 66 yo female with hx of afib will be transitioned from apixaban to warfarin due to nephrology's request.  Patient's coumadin score is 2 and is on  amiodarone.  Apixaban may falsely elevates patient's INR  Goal of Therapy:  INR 2-3 Monitor platelets by anticoagulation protocol: Yes   Plan:  - Stat PT/INR - Coumadin 2.5 mg po x1 - INR daily  Kerrion Kemppainen, Tsz-Yin 05/30/2017,12:22 PM

## 2017-05-30 NOTE — Progress Notes (Signed)
Occupational Therapy Session Note  Patient Details  Name: [name] MRN: [id number] Date of Birth: 1951-10-03  Today's Date: 05/30/2017 OT Individual Time: 1300-1430 OT Individual Time Calculation (min): 90 min    Short Term Goals: Week 3:  OT Short Term Goal 1 (Week 3): Pt will demonstrate ability to pull pants over hips at sit > stand level with mod assist for standing balance OT Short Term Goal 2 (Week 3): Pt will complete hygiene post toileting with mod assist for standing balance with use of AE PRN OT Short Term Goal 3 (Week 3): Pt will complete toilet transfers with min assist  Skilled Therapeutic Interventions/Progress Updates:    Treatment session with focus on sequencing, problem solving, and overall strengthening.  Engaged in meal prep activity in sitting in ADL kitchen with focus on BUE strengthening with reaching for items at table top level while organizing them by meal.  Increased challenge to making a budget and "buying" items while adhering to budget.  Incorporated mental and pen/paper math to ensure pt following budget and could provide totals and estimate on change to receive.  All transfers with slide board mod-min assist with pt requiring initial lifting for weight shift followed by min assist to complete transfer.  Engaged in zoomball activity in unsupported sitting with pt able to lift BLE higher and complete task without physical assist.  Engaged in sit > stand with therapist placed in front of pt in arm chair to facilitate anterior weight shift to achieve upright standing.  Manual facilitation for weight shift to lift off buttocks.  Provided pt with mirror for additional visual input for weight shift.  Completed sit > stand successfully x2 with max assist.  Returned to w/c and then to bed at end of session as above.  Therapy Documentation Precautions:  Precautions Precautions: Fall Restrictions Weight Bearing Restrictions: No Pain:  Pt with no c/o pain  See  Function Navigator for Current Functional Status.   Therapy/Group: Individual Therapy  [name] 05/30/2017, 3:08 PM

## 2017-05-30 NOTE — Progress Notes (Signed)
Physical Therapy Session Note  Patient Details  Name: [name] MRN: [id number] Date of Birth: 08/02/1951  Today's Date: 05/30/2017 PT Individual Time: 1000-1030 PT Individual Time Calculation (min): 30 min   Short Term Goals: Week 3:  PT Short Term Goal 1 (Week 3): pt will perform stand pivot transfer with mod A PT Short Term Goal 2 (Week 3): pt will propel w/c with supervision 25' in controlled environment  Skilled Therapeutic Interventions/Progress Updates: Pt presented hand off from PT agreeable to therapy. Performed wt shifting activities to facilitate sit to/from stand transfers. Performed sit to stand maxA x1 with +1 for safety. Cues for increasing anterior wt shift and manual facilitation to shift hips forward. Performed x 3 attempts. Performed SB transfer to return to w/c with instruction on SB placement and head/hips relationship to facilitate transfer. Pt performed with min guard. Performed w/c propulsion approx 25ft with min guar/supervision and verbal cues for object negotiation. Pt returned to room and remained in w/c with call bell within reach and current needs met.      Therapy Documentation Precautions:  Precautions Precautions: Fall Restrictions Weight Bearing Restrictions: No General:   Vital Signs: Therapy Vitals Temp: 97.6 F (36.4 C) Temp Source: Oral Pulse Rate: (!) 128 Resp: 16 BP: 116/90 Patient Position (if appropriate): Lying  See Function Navigator for Current Functional Status.   Therapy/Group: Individual Therapy      , PTA  05/30/2017, 4:15 PM  

## 2017-05-30 NOTE — Progress Notes (Signed)
Subjective: Interval History: has complaints wants to know when vac comes off.  Objective: Vital signs in last 24 hours: Temp:  [97.8 F (36.6 C)-99.1 F (37.3 C)] 98.6 F (37 C) (08/22 0445) Pulse Rate:  [84-121] 114 (08/22 0445) Resp:  [16-18] 16 (08/22 0445) BP: (80-105)/(54-78) 98/69 (08/22 0445) SpO2:  [96 %-99 %] 98 % (08/22 0445) Weight:  [65.7 kg (144 lb 13.5 oz)] 65.7 kg (144 lb 13.5 oz) (08/22 0445) Weight change: 0.2 kg (7.1 oz)  Intake/Output from previous day: 08/21 0701 - 08/22 0700 In: 200 [P.O.:200] Out: 1200 [Drains:50] Intake/Output this shift: No intake/output data recorded.  General appearance: alert, cooperative and no distress Resp: diminished breath sounds RLL and dullness to percussion RLL Chest wall: RIJ cath Cardio: irregularly irregular rhythm and systolic murmur: systolic ejection 2/6, decrescendo at 2nd left intercostal space GI: soft, pos bs, liver down 4 cm Extremities: edema 1+  Lab Results:  Recent Labs  05/29/17 0459 05/29/17 1616  WBC 9.4 8.6  HGB 8.2* 8.4*  HCT 26.8* 27.2*  PLT 192 205   BMET:  Recent Labs  05/28/17 0851 05/29/17 1614 05/29/17 1616  NA 136 136  --   K 4.0 4.6  --   CL  --  100*  --   CO2  --  23  --   GLUCOSE 81 123*  --   BUN  --  46*  --   CREATININE  --  4.31*  --   CALCIUM  --  8.9 8.7    Recent Labs  05/29/17 1616  PTH 138*  Comment   Iron Studies:  Recent Labs  05/29/17 1616  IRON 51  TIBC 279    Studies/Results: No results found.  I have reviewed the patient's current medications.  Assessment/Plan: 1 ESRD HD tomorrow.  ??Reffusion, low bps, will stop Dilt 2 Afib amio, anticoag. Apixaban not approved for use 3 Anemia esa, , Low Fe give Fe 4 Leg ulcer debrided 5 Dm controlled 6 Debill P coumadin, stop Dilt, Hd     LOS: 23 days   Valeria Krisko L 05/30/2017,11:40 AM

## 2017-05-30 NOTE — Progress Notes (Signed)
Physical Therapy Session Note  Patient Details  Name: [name] MRN: [id number] Date of Birth: 1951-09-11  Today's Date: 05/30/2017 PT Individual Time: 0800-0900 PT Individual Time Calculation (min): 60 min    Skilled Therapeutic Interventions/Progress Updates:    Pt supine in bed upon PT arrival, agreeable to therapy tx and denies pain. Pt donned clean pants with total assist to loop LEs through, min assist to help pull pants over hips while rolling x 2 to each side. Pt transferred from supine>sitting EOB with min assist to help move L LE and verbal cues for technique. Sitting EOB pt doffed dirty shirt and donned cleaned shirt working on dynamic sitting balance with supervision. Pt performed stand step transfer with RW, mod assist to come to standing from elevated bed height and min assist to step and turn using RW. Pt propelled w/c x 5 ft to the sink in order to brush teeth and wash face with supervision. Pt propelled w/c x 20 ft with supervision and verbal cues for technique. Pt attempted to perform sit<>stand from w/c x 2 unsuccessful attempts with single person Max A. Pt performed sit<>stand from w/c x 1 with max assist +2. Pt ambulated x 11 ft using RW and min assist, w/c follow for safety and secondary to decreased endurance and anxiety. Pt left seated in w/c with needs in reach.   Therapy Documentation Precautions:  Precautions Precautions: Fall Restrictions Weight Bearing Restrictions: No   See Function Navigator for Current Functional Status.   Therapy/Group: Individual Therapy  Netta Corrigan, PT, DPT 05/30/2017, 8:31 AM

## 2017-05-30 NOTE — Progress Notes (Signed)
Physical Therapy Session Note  Patient Details  Name: [name] MRN: [id number] Date of Birth: August 21, 1951  Today's Date: 05/30/2017 PT Individual Time: 0930-1000 PT Individual Time Calculation (min): 30 min   Short Term Goals: Week 3:  PT Short Term Goal 1 (Week 3): pt will perform stand pivot transfer with mod A PT Short Term Goal 2 (Week 3): pt will propel w/c with supervision 25' in controlled environment  Skilled Therapeutic Interventions/Progress Updates:    Session focused functional transfers, sit <> stand attempts, and breaking down element of sit < >stand to focus on initial lift off piece. Pt unable to complete sit < > stand with RW from w/c x 2 attempts with +2 assist - pt able to clear bottom but then maintains trunk so far flexed forward unable to come into standing or move UE to RW. Utilized slideboard for transfer to mat table for energy conservation with overall min assist and cues for efficient technique and sequencing. Encouraged increased use of BLE to push up during transfer vs just "wiggling" her hips. Attempted several trials of sit <> stand from significantly elevated mat table with pt unable to achieve full standing position - could not weightshift enough anterior to get weight over BLE or make transition from bottom off the mat to upright position despite several attempts, changes in hand placement, and facilitation. Broke down activity to work on anterior weightshift to achieve clearance of bottom with max assist and then scooting on mat with focus on clearing bottom and pushing through BLE. Pt with poor carryover and requires mod instructional cues for activities. Handoff to next PTA.  Therapy Documentation Precautions:  Precautions Precautions: Fall Restrictions Weight Bearing Restrictions: No  Pain: Pain Assessment Pain Assessment: No/denies pain   See Function Navigator for Current Functional Status.   Therapy/Group: Individual Therapy  Canary Brim  Ivory Broad, PT, DPT  05/30/2017, 11:23 AM

## 2017-05-30 NOTE — Progress Notes (Signed)
[facility] PHYSICAL MEDICINE & REHABILITATION     PROGRESS NOTE  Subjective/Complaints:  Slept well. No pain. Ready for therapy.   ROS: pt denies nausea, vomiting, diarrhea, cough, shortness of breath or chest pain    Objective: Vital Signs: Blood pressure 98/69, pulse (!) 114, temperature 98.6 F (37 C), temperature source Oral, resp. rate 16, height 5\' 5"  (1.651 m), weight 65.7 kg (144 lb 13.5 oz), SpO2 98 %. No results found.  Recent Labs  05/29/17 0459 05/29/17 1616  WBC 9.4 8.6  HGB 8.2* 8.4*  HCT 26.8* 27.2*  PLT 192 205    Recent Labs  05/28/17 0851 05/29/17 1614  NA 136 136  K 4.0 4.6  CL  --  100*  GLUCOSE 81 123*  BUN  --  46*  CREATININE  --  4.31*  CALCIUM  --  8.9   CBG (last 3)   Recent Labs  05/29/17 1148 05/29/17 2024 05/30/17 0648  GLUCAP 125* 103* 138*    Wt Readings from Last 3 Encounters:  05/30/17 65.7 kg (144 lb 13.5 oz)  05/07/17 75.3 kg (166 lb 0.1 oz)    Physical Exam:  BP 98/69 (BP Location: Right Arm)   Pulse (!) 114   Temp 98.6 F (37 C) (Oral)   Resp 16   Ht 5\' 5"  (1.651 m)   Wt 65.7 kg (144 lb 13.5 oz)   SpO2 98%   BMI 24.10 kg/m  Constitutional: She appears well-developed and well-nourished.  HENT: Normocephalic. Atraumatic. Eyes: EOMI. No discharge.  Cardiovascular: IRR/IRR Respiratory: CTA Bilaterally without wheezes or rales. Normal effort  .  GI: Soft. Bowel sounds are normal.   Musculoskeletal: She exhibits edema and tenderness.  Neurological: She is alert and oriented.  Able to follow commands without difficulty.  Motor: B/l UE 4/5 proximal to distal RLE: HF 3/5, KE 3+/5, ADF/PF 4/5  LLE: HF 3-/5, KE 3/5, ADF/PF 3+/5 (stable) Skin: LLE with vac. Small amount of drainage Psychiatric: She has a normal mood and affect. Her behavior is normal. Thought content normal.    Assessment/Plan: 1. 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 Physiatrist is providing close team supervision and 24 hour management of active medical problems listed below. Physiatrist and rehab team continue to assess barriers to discharge/monitor patient progress toward functional and medical goals.  Function:  Bathing Bathing position   Position: Bed  Bathing parts Body parts bathed by patient: Right arm, Left arm, Chest, Abdomen, Right upper leg, Left upper leg, Right lower leg Body parts bathed by helper: Front perineal area, Buttocks, Back  Bathing assist Assist Level: Touching or steadying assistance(Pt > 75%)      Upper Body Dressing/Undressing Upper body dressing   What is the patient wearing?: Pull over shirt/dress     Pull over shirt/dress - Perfomed by patient: Put head through opening, Thread/unthread left sleeve, Thread/unthread right sleeve Pull over shirt/dress - Perfomed by helper: Pull shirt over trunk        Upper body assist Assist Level: Touching or steadying assistance(Pt > 75%)   Set up : To obtain clothing/put away  Lower Body Dressing/Undressing Lower body dressing   What is the patient wearing?: Pants, Non-skid slipper socks     Pants- Performed by patient: Thread/unthread right pants leg Pants- Performed by helper: Thread/unthread left pants leg, Pull pants up/down Non-skid slipper socks- Performed by patient: Don/doff right sock Non-skid slipper socks- Performed  by helper: Don/doff right sock                  Lower body assist Assist for lower body dressing:  (Mod assist)      Toileting Toileting Toileting activity did not occur: N/A (anuric)   Toileting steps completed by helper: Adjust clothing prior to toileting, Performs perineal hygiene, Adjust clothing after toileting    Toileting assist Assist level: Two helpers (+2 for clothing managment only)   Transfers Chair/bed transfer   Chair/bed transfer method: Stand pivot Chair/bed transfer assist level: Moderate  assist (Pt 50 - 74%/lift or lower) Chair/bed transfer assistive device: Armrests Mechanical lift: Stedy   Locomotion Ambulation Ambulation activity did not occur: Safety/medical concerns   Max distance: 13 Assist level: 2 helpers   Wheelchair   Type: Manual Max wheelchair distance: 10' Assist Level: Touching or steadying assistance (Pt > 75%)  Cognition Comprehension Comprehension assist level: Follows basic conversation/direction with no assist  Expression Expression assist level: Expresses basic needs/ideas: With no assist  Social Interaction Social Interaction assist level: Interacts appropriately with others with medication or extra time (anti-anxiety, antidepressant).  Problem Solving Problem solving assist level: Solves basic 90% of the time/requires cueing < 10% of the time  Memory Memory assist level: Recognizes or recalls 90% of the time/requires cueing < 10% of the time    Medical Problem List and Plan: 1.  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  DVT Prophylaxis/Anticoagulation: eliquis resumed    3. Pain Management: tylenol prn 4. Mood: LCSW to follow for evaluation and support.  5. Neuropsych: This patient is capable of making decisions on her own behalf. 6. Skin/Wound Care: routine pressure relief measures.   -vac for LLE---low output currnetly--mgt per plastics.  7. Fluids/Electrolytes/Nutrition: Strict I/Os.   8.  Hypotension: Monitor orthostatic BP. Added abdominal binder and TEDs to help with BP support.    Vitals:   05/29/17 2122 05/30/17 0445  BP: 99/63 98/69  Pulse:  (!) 114  Resp:  16  Temp:  98.6 F (37 C)  SpO2:  98%   Controlled 8/17 9. Paroxysmal Afib:  on eliquis for stroke prevention. Continue amiodarone bid   Cardizem increased to 180 on 8/16.   Cont to monitor 10. Leukocytosis: Resolved              Patient with ulcers--continue with WOC recs/daily dressing             Afebrile              Cont to monitor 11. Anemia of chronic disease: On epogen  MWF.              Hb 8.4 on 8/21             Cont to montior 12. Left foot/leg ulcer:   -continue dressing changes/wound care/vac   Debrided 8/6, again 8/13  S/p debridement/Acell placement to left leg wound/vac 8/20 by Dr. [name] 13. Anxiety/depression: On Seroquel and Zoloft at nights.  14. Prediabetes: CBG (last 3)   Recent Labs  05/29/17 1148 05/29/17 2024 05/30/17 0648  GLUCAP 125* 103* 138*   Relatively controlled   15. CKD now HD dependent: HD on MWF at end of day to help with tolerance of therapy. Renal diet--Reducate patient on appropriate diet.  16. Vaginal bleeding  Per PCP no issues prior  Gyn consulted, Megace initiated, transvaginal U/S showing endometrial thickening and mass.  Contacted Gyn, per recs, outpt workup to rule out CA.  Improved at present     LOS (Days) 23 A FACE TO FACE EVALUATION WAS PERFORMED  SWARTZ,ZACHARY T 05/30/2017 8:54 AM

## 2017-05-31 ENCOUNTER — Inpatient Hospital Stay (HOSPITAL_COMMUNITY): Payer: Medicare Other | Admitting: Speech Pathology

## 2017-05-31 ENCOUNTER — Inpatient Hospital Stay (HOSPITAL_COMMUNITY): Payer: Medicare Other | Admitting: Physical Therapy

## 2017-05-31 ENCOUNTER — Inpatient Hospital Stay (HOSPITAL_COMMUNITY): Payer: Medicare Other | Admitting: Occupational Therapy

## 2017-05-31 LAB — RENAL FUNCTION PANEL
ANION GAP: 12 (ref 5–15)
Albumin: 2.9 g/dL — ABNORMAL LOW (ref 3.5–5.0)
BUN: 31 mg/dL — ABNORMAL HIGH (ref 6–20)
CHLORIDE: 98 mmol/L — AB (ref 101–111)
CO2: 26 mmol/L (ref 22–32)
Calcium: 8.7 mg/dL — ABNORMAL LOW (ref 8.9–10.3)
Creatinine, Ser: 3.32 mg/dL — ABNORMAL HIGH (ref 0.44–1.00)
GFR, EST AFRICAN AMERICAN: 16 mL/min — AB (ref >60)
GFR, EST NON AFRICAN AMERICAN: 14 mL/min — AB (ref >60)
Glucose, Bld: 86 mg/dL (ref 65–99)
POTASSIUM: 3.9 mmol/L (ref 3.5–5.1)
Phosphorus: 3.9 mg/dL (ref 2.5–4.6)
Sodium: 136 mmol/L (ref 135–145)

## 2017-05-31 LAB — CBC
HEMATOCRIT: 26.3 % — AB (ref 36.0–46.0)
HEMOGLOBIN: 8.3 g/dL — AB (ref 12.0–15.0)
MCH: 33.6 pg (ref 26.0–34.0)
MCHC: 31.6 g/dL (ref 30.0–36.0)
MCV: 106.5 fL — AB (ref 78.0–100.0)
PLATELETS: 221 10*3/uL (ref 150–400)
RBC: 2.47 MIL/uL — AB (ref 3.87–5.11)
RDW: 24.4 % — ABNORMAL HIGH (ref 11.5–15.5)
WBC: 7.8 10*3/uL (ref 4.0–10.5)

## 2017-05-31 LAB — PROTIME-INR
INR: 1.43
Prothrombin Time: 17.6 seconds — ABNORMAL HIGH (ref 11.4–15.2)

## 2017-05-31 LAB — GLUCOSE, CAPILLARY
GLUCOSE-CAPILLARY: 89 mg/dL (ref 65–99)
Glucose-Capillary: 86 mg/dL (ref 65–99)
Glucose-Capillary: 105 mg/dL — ABNORMAL HIGH (ref 65–99)
Glucose-Capillary: 107 mg/dL — ABNORMAL HIGH (ref 65–99)

## 2017-05-31 MED ORDER — ALTEPLASE 2 MG IJ SOLR: 2.0000 mg | Freq: Once | INTRAMUSCULAR | Status: DC | PRN

## 2017-05-31 MED ORDER — MIDODRINE HCL 5 MG PO TABS
10.0000 mg | ORAL_TABLET | Freq: Once | ORAL | Status: AC
  Administered 2017-05-31: 10 mg via ORAL
  Filled 2017-05-31: qty 2

## 2017-05-31 MED ORDER — SODIUM CHLORIDE 0.9 % IV SOLN: 100.0000 mL | INTRAVENOUS | Status: DC | PRN

## 2017-05-31 MED ORDER — LIDOCAINE-PRILOCAINE 2.5-2.5 % EX CREA: 1.0000 "application " | TOPICAL_CREAM | CUTANEOUS | Status: DC | PRN

## 2017-05-31 MED ORDER — LIDOCAINE HCL (PF) 1 % IJ SOLN: 5.0000 mL | INTRAMUSCULAR | Status: DC | PRN

## 2017-05-31 MED ORDER — DARBEPOETIN ALFA 150 MCG/0.3ML IJ SOSY
PREFILLED_SYRINGE | INTRAMUSCULAR | Status: AC
  Administered 2017-05-31: 150 ug via INTRAVENOUS
  Filled 2017-05-31: qty 0.3

## 2017-05-31 MED ORDER — HEPARIN SODIUM (PORCINE) 1000 UNIT/ML DIALYSIS
1000.0000 [IU] | INTRAMUSCULAR | Status: DC | PRN
  Administered 2017-06-01: 1000 [IU] via INTRAVENOUS_CENTRAL

## 2017-05-31 MED ORDER — PENTAFLUOROPROP-TETRAFLUOROETH EX AERO: 1.0000 "application " | INHALATION_SPRAY | CUTANEOUS | Status: DC | PRN

## 2017-05-31 MED ORDER — WARFARIN SODIUM 5 MG PO TABS
5.0000 mg | ORAL_TABLET | Freq: Once | ORAL | Status: AC
  Administered 2017-05-31: 5 mg via ORAL
  Filled 2017-05-31: qty 1

## 2017-05-31 MED ORDER — HEPARIN SODIUM (PORCINE) 1000 UNIT/ML DIALYSIS
100.0000 [IU]/kg | INTRAMUSCULAR | Status: DC | PRN
  Administered 2017-05-31: 6600 [IU] via INTRAVENOUS_CENTRAL

## 2017-05-31 NOTE — Patient Care Conference (Signed)
Inpatient RehabilitationTeam Conference and Plan of Care Update Date: 05/30/2017   Time: 11:00 AM    Patient Name: [name]      Medical Record Number: [id number]  Date of Birth: 10-Dec-1950 Sex: Female         Room/Bed: 4W06C/4W06C-01 Payor Info: Payor: [employer] MEDICARE / Plan: UHC MEDICARE / Product Type: *No Product type* /    Admitting Diagnosis: Critical Illness ISCHEMIC ULCERS OF LEFT LOWER LEG  Admit Date/Time:  05/07/2017  6:16 PM Admission Comments: No comment available   Primary Diagnosis:  <principal problem not specified> Principal Problem: <principal problem not specified>  Patient Active Problem List   Diagnosis Date Noted  . ESRD (end stage renal disease) ([place])   . Vaginal discharge   . AKI (acute kidney injury) ([place])   . Bleeding   . A-fib ([place])   . Abnormal urinalysis   . Dependence on hemodialysis ([place])   . Ulcer of left lower extremity with fat layer exposed ([place])   . Skin ulcer of left foot with fat layer exposed ([place])   . Vaginal bleeding   . Hypoglycemia associated with diabetes ([place])   . Skin ulcer of right heel ([place])   . PAF (paroxysmal atrial fibrillation) ([place])   . Hypotension   . Leukocytosis   . Anemia of chronic disease   . Anxiety state   . Depression   . Prediabetes   . Stage 5 chronic kidney disease not on chronic dialysis ([place])   . 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 Pressure injury of skin 04/19/2017  . Acute renal failure (ARF) ([place])   . Cellulitis of lower extremity   . Edema extremities   . Acute respiratory failure ([place])   . Acute renal failure ([place])   . 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 Palliative care by specialist   . Goals of care, counseling/discussion   . Septic shock ([place]) 04/08/2017    Expected Discharge Date: Expected Discharge Date: 06/07/17  Team Members Present: Physician leading conference: Dr. Alysia Penna Social Worker Present: Lennart Pall, LCSW Nurse Present: Other (comment)  Felizardo Hoffmann Troxler, RN) PT Present: Leavy Cella, PT OT Present: [name], OT SLP Present: Windell Moulding, SLP PPS Coordinator present : Daiva Nakayama, RN, CRRN     Current Status/Progress Goal Weekly Team Focus  Medical   pain controlled, left lower leg s/p I&D, A-cell placement by plastics, converting back to eliquis  improve activity tolerance  anticoagulation, renal mgt, wound care/vac   Bowel/Bladder   pt is cont x2   remain continent during hospital admission  assist with toileting needs as needed   Swallow/Nutrition/ Hydration             ADL's   mod assist transfers with slide board, min assist LB bathing and dressing at bed level, +2 for any functional standing  Min assist overall  ADL retraining, BUE strengthening, transfers, standing   Mobility   min A sliding board, max A sit to stand   min A w/c level (gait goal for PT only)  strength, gait, transfers   Communication             Safety/Cognition/ Behavioral Observations            Pain   pt was free of pain throughout the night  maintain a pain level of less than 3 during hospital stay  monitor pain level   Skin   pt has cellulitis bilateral legs, wound vac on left  leg  no new skin issues  improving cellulitis on bilateral leg    Rehab Goals Patient on target to meet rehab goals: Yes *See Care Plan and progress notes for long and short-term goals.     Barriers to Discharge  Current Status/Progress Possible Resolutions Date Resolved   Physician    Medical stability        HD, vacuum therapy LLE      Nursing                  PT                    OT                  SLP                SW                Discharge Planning/Teaching Needs:  Plan home with spouse who can provide 24/7 assistance.  Teaching to be schedule to start next week.   Team Discussion:  Still with wound VAC but minimal output and hope to d/c prior to d/c.  Therapies questioning if neuropsych testing might be beneficial or possible  re-eval by ST.  Having a very difficult time following directions.  Currently min/ mod assist b/d.  Max assist to stand.  Slide board transfers with min/ mod.  On track for min assist goals.  Revisions to Treatment Plan:  ST to re-consult.    Continued Need for Acute Rehabilitation Level of Care: The patient requires daily medical management by a physician with specialized training in physical medicine and rehabilitation for the following conditions: Daily direction of a multidisciplinary physical rehabilitation program to ensure safe treatment while eliciting the highest outcome that is of practical value to the patient.: Yes Daily medical management of patient stability for increased activity during participation in an intensive rehabilitation regime.: Yes Daily analysis of laboratory values and/or radiology reports with any subsequent need for medication adjustment of medical intervention for : Neurological problems;Post surgical problems;Renal problems  Penda Venturi 05/31/2017, 2:29 PM

## 2017-05-31 NOTE — Progress Notes (Addendum)
Social Work Patient ID: [name], female   DOB: [birth year], 66 y.o.   MRN: [id number]   Met with pt and spouse this afternoon to review team conference and discuss d/c arrangements. They are aware that team continues to plan toward 8/30 d/c date and min assist goals overall.  We reviewed need to begin family education.  Discussed needed DME and follow up [facility].  They are aware that I will be out of the office next week and co-workers will be covering.  I have made [facility] referral with [facility] but have not yet ordered DME.  We also discussed HD schedule which is still being determined at HD center in [facility].  They are aware that, if she is to have HD the day of d/c, then we will have her placed on "1st run" time slot.   Husband reports that he will likely purchase an aluminum ramp from [employer] for home.  We have planned for him to begin family education on Monday at 10:00 am.  SW will continue to follow for remainder of d/c needs.  Abreanna Drawdy, LCSW

## 2017-05-31 NOTE — Plan of Care (Signed)
Problem: RH Toileting Goal: LTG Patient will perform toileting w/assist, cues/equip (OT) LTG: Patient will perform toiletiing (clothes management/hygiene) with assist, with/without cues using equipment (OT)  Downgraded due to slow progress   

## 2017-05-31 NOTE — Progress Notes (Signed)
Physical Therapy Weekly Progress Note  Patient Details  Name: [name] MRN: [id number] Date of Birth: 07-May-1951  Beginning of progress report period: May 22, 2017 End of progress report period: May 31, 2017   Patient has met 1 of 2 short term goals.  Pt making slow progress with a few set backs due to surgery and medical issues.  Pt max A to stand with RW, unable to take steps since surgery this week.  Pt min A sliding board transfers.  Pt working on improving activity tolerance and strength for d/c home.  Patient continues to demonstrate the following deficits muscle weakness and decreased standing balance, decreased postural control and decreased balance strategies and therefore will continue to benefit from skilled PT intervention to increase functional independence with mobility.  Patient not progressing toward long term goals.  See goal revision..  Continue plan of care.  PT Short Term Goals Week 3:  PT Short Term Goal 1 (Week 3): pt will perform stand pivot transfer with mod A PT Short Term Goal 1 - Progress (Week 3): Progressing toward goal PT Short Term Goal 2 (Week 3): pt will propel w/c with supervision 25' in controlled environment PT Short Term Goal 2 - Progress (Week 3): Met Week 4:  PT Short Term Goal 1 (Week 4): STG=LTG  Skilled Therapeutic Interventions/Progress Updates:  Ambulation/gait training;Discharge planning;DME/adaptive equipment instruction;Functional mobility training;Splinting/orthotics;Therapeutic Activities;UE/LE Strength taining/ROM;Pain management;Wheelchair propulsion/positioning;UE/LE Coordination activities;Therapeutic Exercise;Stair training;Patient/family education;Neuromuscular re-education;Functional electrical stimulation;Community reintegration;Balance/vestibular training    See Function Navigator for Current Functional Status.    DONAWERTH,KAREN 05/31/2017, 8:15 AM

## 2017-05-31 NOTE — Progress Notes (Signed)
ANTICOAGULATION CONSULT NOTE - Follow Up Consult  Pharmacy Consult for couamdin Indication: atrial fibrillation  No Known Allergies  Patient Measurements: Height: 5\' 5"  (165.1 cm) Weight: 145 lb 4.5 oz (65.9 kg) IBW/kg (Calculated) : 57 Heparin Dosing Weight:   Vital Signs: Temp: 98.2 F (36.8 C) (08/23 0500) Temp Source: Oral (08/23 0500) BP: 100/77 (08/23 0500) Pulse Rate: 111 (08/23 0500)  Labs:  Recent Labs  05/28/17 0851 05/28/17 1925 05/29/17 0459 05/29/17 1614 05/29/17 1616 05/30/17 1503 05/31/17 0605  HGB 9.2*  --  8.2*  --  8.4*  --   --   HCT 27.0*  --  26.8*  --  27.2*  --   --   PLT  --   --  192  --  205  --   --   APTT  --  54*  --   --   --   --   --   LABPROT  --   --   --   --   --  18.2* 17.6*  INR  --   --   --   --   --  1.49 1.43  HEPARINUNFRC  --  0.65 0.56  --   --   --   --   CREATININE  --   --   --  4.31*  --   --   --     Estimated Creatinine Clearance: 11.7 mL/min (A) (by C-G formula based on SCr of 4.31 mg/dL (H)).   Medications:  Scheduled:  . amiodarone  200 mg Oral BID  . darbepoetin (ARANESP) injection - DIALYSIS  150 mcg Intravenous Q Thu-HD  . famotidine  20 mg Oral QHS  . feeding supplement (PRO-STAT SUGAR FREE 64)  30 mL Oral BID  . hydrocerin   Topical BID  . levothyroxine  100 mcg Oral QAC breakfast  . megestrol  40 mg Oral BID  . midodrine  10 mg Oral TID WC  . multivitamin  1 tablet Oral QHS  . QUEtiapine  25 mg Oral QHS  . senna-docusate  2 tablet Oral QHS  . sertraline  25 mg Oral QHS  . warfarin  5 mg Oral ONCE-1800  . Warfarin - Pharmacist Dosing Inpatient   Does not apply q1800   Infusions:  . ferric gluconate (FERRLECIT/NULECIT) IV      Assessment: 66 yo female with afib is currently on subtherapeutic coumadin.  INR today is 1.43.  On amiodarone.  Goal of Therapy:  INR 2-3 Monitor platelets by anticoagulation protocol: Yes   Plan:  - coumadin 5mg  po x1 - INR in am  Airyanna Dipalma, Tsz-Yin 05/31/2017,8:10  AM

## 2017-05-31 NOTE — Progress Notes (Signed)
HD tx initiated via HD cath w/ some cath function issues, AP pulls ok but push/flush sluggish, VP pull/push/flush well, d/t the sluggish push/flush of AP I can't reverse lines, unable to reach prescribed BFR, VSS, will cont to monitor while on HD tx

## 2017-05-31 NOTE — Plan of Care (Signed)
Problem: RH SKIN INTEGRITY Goal: RH STG SKIN FREE OF INFECTION/BREAKDOWN Patients skin will remain free from further infection or breakdown with mod assist.  Outcome: Progressing Left lower leg with wound vac draining scant amonut of serosanguinous drainage without difficulty, no S/S of infection noted

## 2017-05-31 NOTE — Progress Notes (Addendum)
Physical Therapy Note  Patient Details  Name: [name] MRN: [id number] Date of Birth: 1951/07/14 Today's Date: 05/31/2017    Time: 388-875 28 minutes  1:1 No c/o pain.  Session focused on gait training and sit to stand.  Pt requires max A to stand initially from w/c, cues for UE placement each time. With fatigue pt requires +2 assist to stand.  Gait with RW with +2 assist for safety 4 x 8'. Pt with 2 episodes of suddenly buckling needing chair right behind her to prevent fall.  Pt pleased she is able to walk but understands it will not be safe for home.   Time: 1300-1343 43 minutes  1:1 No c/o pain.  Stand pivot transfer training x 2 with max A.  Sit to stand x 6 throughout session initially max A with max cues for UE placement each technique, regressed to total A by end of session due to fatigue.  Standing balance ball tap and ball catch with min A for balance.  Attempted ball kick in standing, pt unable to perform in standing.  Ball kick in sitting with Lt LE more weak than Rt.  Joeleen Wortley 05/31/2017, 8:58 AM

## 2017-05-31 NOTE — Evaluation (Signed)
Speech Language Pathology Assessment and Plan  Patient Details  Name: [name] MRN: [id number] Date of Birth: May 28, 1951  SLP Diagnosis: Cognitive Impairments  Rehab Potential: Good ELOS: d/c date of 8/30     Today's Date: 05/31/2017 SLP Individual Time: 1034-1130 SLP Individual Time Calculation (min): 56 min   Problem List:  Patient Active Problem List   Diagnosis Date Noted  . ESRD (end stage renal disease) ([place])   . Vaginal discharge   . AKI (acute kidney injury) ([place])   . Bleeding   . A-fib ([place])   . Abnormal urinalysis   . Dependence on hemodialysis ([place])   . Ulcer of left lower extremity with fat layer exposed ([place])   . Skin ulcer of left foot with fat layer exposed ([place])   . Vaginal bleeding   . Hypoglycemia associated with diabetes ([place])   . Skin ulcer of right heel ([place])   . PAF (paroxysmal atrial fibrillation) ([place])   . Hypotension   . Leukocytosis   . Anemia of chronic disease   . Anxiety state   . Depression   . Prediabetes   . Stage 5 chronic kidney disease not on chronic dialysis ([place])   . 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 Pressure injury of skin 04/19/2017  . Acute renal failure (ARF) ([place])   . Cellulitis of lower extremity   . Edema extremities   . Acute respiratory failure ([place])   . Acute renal failure ([place])   . Multi-organ failure with heart failure ([place])   . Palliative care by specialist   . Goals of care, counseling/discussion   . Septic shock ([place]) 04/08/2017   Past Medical History:  Past Medical History:  Diagnosis Date  . A-fib ([place])    on pradaxa  . Chronic kidney disease   . Dysrhythmia   . Hypertension   . Hypothyroidism   . Thyroid disease    Past Surgical History:  Past Surgical History:  Procedure Laterality Date  . APPLICATION OF A-CELL OF EXTREMITY Left 05/28/2017   Procedure: APPLICATION OF A-CELL OF LEFT LOWER EXTREMITY;  Surgeon: Wallace Going, DO;  Location: [facility];  Service: Plastics;  Laterality: Left;   . APPLICATION OF WOUND VAC Left 05/28/2017   Procedure: APPLICATION OF WOUND VAC;  Surgeon: Wallace Going, DO;  Location: [facility];  Service: Plastics;  Laterality: Left;  . DIALYSIS/PERMA CATHETER INSERTION N/A 04/26/2017   Procedure: Dialysis/Perma Catheter Insertion;  Surgeon: Algernon Huxley, MD;  Location: [facility];  Service: Cardiovascular;  Laterality: N/A;  . INCISION AND DRAINAGE OF WOUND Left 05/28/2017   Procedure: IRRIGATION AND DEBRIDEMENT LEFT LOWER LEG WOUND;  Surgeon: Wallace Going, DO;  Location: [facility];  Service: Plastics;  Laterality: Left;  . PR DEBRIDEMENT, SKIN, SUB-Q TISSUE,=<20 SQ CM  05/21/2017      . PR DEBRIDEMENT, SKIN, SUB-Q TISSUE,EACH ADD 20 SQ CM  05/21/2017      . WRIST ARTHROSCOPY      Assessment / Plan / Recommendation Clinical Impression   [name] an 66 y.o.female with history of HTN, A fib-on pradaxa who was admitted on [facility] on 04/08/17 due to BLE cellulitis with difficulty walking. She was found to be septic with leucocytosis and hypotensive requiring fluid bolus and pressors.  Per records, she has not had any meds X 3 months and had two week history of DOE, BLE edema with fatigue and weakness. She was started on broad spectrum antibiotics and was noted to be volume  overloaded with right pleural effusion. CRRT initiated and  hospital course significant for PEA arrest 7/4, ARDS, anuria requiring  IJ placement by vascular surgery as now HD dependent, shocked liver with abnormal LFTs, thrombocytopenia, ongoing issues with hypotension and tachycardia.  She developed BUE edema and dopplers done revealing "occlusive deep vein thrombosis in the left cephalic vein extending from the mid humeral region down towards the elbow".   Pressors weaned off and ProAmatine added to help with BP support. She continues on amiodarone and Cardizem for HR control and Dr. [name] recommended coumadin or low dose Eliquis due to A fib/stroke prophylaxis. 2 D echo  revealed EF 25-30% with mild concentric hypertrophy, moderate AVR, moderate TVR and mild MVR.  Serologic work up negative and limited hope for renal recovery per nephrology--IV albumin being used during HD treatment prn.  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 Therapy working on pre-gait activity and CIR recommended due to substantial deficits in mobility and ability to carry out ADL tasks.   SLP evaluation completed upon initial admission to CIR, recommending no ST follow up services as it was felt that pt was at her baseline for cognition.  Repeat SLP consult requested due to ongoing issues with recall of new information reported by PT/OT.  Results of assessment are as follows:  Pt presents with mildly decreased recall of new information and functional problem solving which appear most consistently related to anxiety and fatigue and their concomitant effects on pt's ability to process, store, and retrieve information.  Pt was labile during evaluation, stating she puts a lot of pressure on herself to do well in therapy and is fearful of failure.  Pt endorses anxiety due to prolonged hospitalization which she feels interferes with performance in therapies.  Pt would benefit from 1-2 additional ST sessions for ongoing diagnostic treatment of cognitive skills to definitively rule out cognitive dysfunction as primary source of memory and problem solving impairment.  Pt in agreement with recommended plan of care.    Skilled Therapeutic Interventions          Cognitive-linguistic evaluation completed with results and recommendations reviewed with patient.  Pt needed intermittent supervision level verbal cues to recognize and correct errors during money management subtests of ALFA cognitive assessment.  Pt asked for repetition of instructions appropriately to compensate for memory deficits on tasks but was often apologetic for asking therapist to repeat herself.    Therapist provided encouragement and support.  Pt would benefit from further diagnostic treatment of cognition through money and medication management, as pt reports she managed her own meds and finances prior to admission. Pt was left in wheelchair with call bell within reach.  Continue per current plan of care.      SLP Assessment  Patient will need skilled Speech Lanaguage Pathology Services during CIR admission    Recommendations  Recommendations for Other Services: Neuropsych consult Patient destination: Home Follow up Recommendations: None Equipment Recommended: None recommended by SLP    SLP Frequency  (1-2 additional therapy sessions )   SLP Duration  SLP Intensity  SLP Treatment/Interventions d/c date of 8/30   Minumum of 1-2 x/day, 30 to 90 minutes  Cognitive remediation/compensation;Cueing hierarchy;Functional tasks;Internal/external aids;Patient/family education    Pain Pain Assessment Pain Assessment: No/denies pain  Prior Functioning Cognitive/Linguistic Baseline: Within functional limits Type of Home: House  Lives With: Spouse Available Help at Discharge: Family;Available 24 hours/day Education: high school  Vocation: Retired  Function:  Eating  Eating   Modified Consistency Diet: No Eating Assist Level: No help, No cues           Cognition Comprehension Comprehension assist level: Understands complex 90% of the time/cues 10% of the time  Expression   Expression assist level: Expresses complex ideas: With extra time/assistive device  Social Interaction Social Interaction assist level: Interacts appropriately with others with medication or extra time (anti-anxiety, antidepressant).  Problem Solving Problem solving assist level: Solves complex 90% of the time/cues < 10% of the time  Memory Memory assist level: Recognizes or recalls 90% of the time/requires cueing < 10% of the time   Short Term Goals: Week 1: SLP Short Term Goal 1 (Week 1): STG=LTG  due to remaining LOS  Refer to Care Plan for Long Term Goals  Recommendations for other services: Neuropsych  Discharge Criteria: Patient will be discharged from SLP if patient refuses treatment 3 consecutive times without medical reason, if treatment goals not met, if there is a change in medical status, if patient makes no progress towards goals or if patient is discharged from hospital.  The above assessment, treatment plan, treatment alternatives and goals were discussed and mutually agreed upon: by patient  Emilio Math 05/31/2017, 2:19 PM

## 2017-05-31 NOTE — Progress Notes (Signed)
[facility]YS[facility]ICINE [facility] RE[facility]ILIT[facility]     PROGRESS NOTE  Subjective/Complaints:  Felt tired yesterday, didn't have much energy with therapy. Pain controlled  ROS: pt denies nausea, vomiting, diarrhea, cough, shortness of breath or chest pain    Objective: Vital Signs: Blood pressure 100/77, pulse (!) 111, temperature 98.2 F (36.8 C), temperature source Oral, resp. rate 17, height 5\' 5"  (1.651 m), weight 65.9 kg (145 lb 4.5 oz), SpO2 99 %. Dg Chest 2 View  Result Date: 05/30/2017 CLINICAL DATA:  CHF. EXAM: CHEST  2 VIEW COMPARISON:  Chest x-ray dated April 21, 2017. FINDINGS: Interval removal of bilateral internal jugular central venous catheters. Interval placement of a tunneled right internal jugular dialysis catheter with the tip at the cavoatrial junction. Interval removal of previously seen enteric tube. Cardiomegaly, unchanged. Mild pulmonary vascular congestion. Mild hazy opacification of the right lung base, likely a combination of pleural effusion and atelectasis. No consolidation or pneumothorax. No acute osseous abnormality. IMPRESSION: 1. Cardiomegaly with mild pulmonary vascular congestion. 2. Small right pleural effusion with adjacent right lower lobe opacity, likely atelectasis. Electronically Signed   By: Titus Dubin M.D.   On: 05/30/2017 15:44    Recent Labs  05/29/17 0459 05/29/17 1616  WBC 9.4 8.6  HGB 8.2* 8.4*  HCT 26.8* 27.2*  PLT 192 205    Recent Labs  05/29/17 1614 05/29/17 1616  NA 136  --   K 4.6  --   CL 100*  --   GLUCOSE 123*  --   BUN 46*  --   CREATININE 4.31*  --   CALCIUM 8.9 8.7   CBG (last 3)   Recent Labs  05/30/17 1706 05/30/17 2122 05/31/17 0623  GLUCAP 85 106* 86    Wt Readings from Last 3 Encounters:  05/31/17 65.9 kg (145 lb 4.5 oz)  05/07/17 75.3 kg (166 lb 0.1 oz)    Physical Exam:  BP 100/77 (BP Location: Right Arm)   Pulse (!) 111   Temp 98.2 F (36.8 C) (Oral)   Resp 17   Ht 5\' 5"  (1.651 m)   Wt  65.9 kg (145 lb 4.5 oz)   SpO2 99%   BMI 24.18 kg/m  Constitutional: She appears well-developed and well-nourished.  HENT: Normocephalic. Atraumatic. Eyes: EOMI. No discharge.  Cardiovascular: IRR/IRR Respiratory: CTA Bilaterally without wheezes or rales. Normal effort  .  GI: Soft. Bowel sounds are normal.   Musculoskeletal: She exhibits edema and tenderness.  Neurological: She is alert and oriented.  Able to follow commands without difficulty.  Motor: B/l UE 4/5 proximal to distal RLE: HF 3/5, KE 3+/5, ADF/PF 4/5  LLE: HF 3-/5, KE 3/5, ADF/PF 3+/5 (stable) Skin: LLE with vac. Small amount of drainage in cannister  Psychiatric: She has a normal mood and affect. Her behavior is normal. Thought content normal.    Assessment/Plan: 1. 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 Physiatrist is providing close team supervision and 24 hour management of active medical problems listed below. Physiatrist and rehab team continue to assess barriers to discharge/monitor patient progress toward functional and medical goals.  Function:  Bathing Bathing position   Position: Bed  Bathing parts Body parts bathed by patient: Right arm, Left arm, Chest, Abdomen, Right upper leg, Left upper leg, Right lower leg, Front perineal area Body parts bathed by helper: Back, Buttocks  Bathing assist Assist Level: Touching or steadying assistance(Pt > 75%)  Upper Body Dressing/Undressing Upper body dressing   What is the patient wearing?: Pull over shirt/dress     Pull over shirt/dress - Perfomed by patient: Put head through opening, Thread/unthread left sleeve, Thread/unthread right sleeve, Pull shirt over trunk Pull over shirt/dress - Perfomed by helper: Pull shirt over trunk        Upper body assist Assist Level: Set up   Set up : To obtain clothing/put away  Lower Body  Dressing/Undressing Lower body dressing   What is the patient wearing?: Pants, Non-skid slipper socks     Pants- Performed by patient: Thread/unthread right pants leg Pants- Performed by helper: Thread/unthread left pants leg, Pull pants up/down Non-skid slipper socks- Performed by patient: Don/doff right sock Non-skid slipper socks- Performed by helper: Don/doff right sock                  Lower body assist Assist for lower body dressing:  (Mod assist)      Toileting Toileting Toileting activity did not occur: N/A (anuric)   Toileting steps completed by helper: Adjust clothing prior to toileting, Performs perineal hygiene, Adjust clothing after toileting    Toileting assist Assist level: Two helpers (+2 for clothing managment only)   Transfers Chair/bed transfer   Chair/bed transfer method: Lateral scoot Chair/bed transfer assist level: Touching or steadying assistance (Pt > 75%) Chair/bed transfer assistive device: Sliding board Mechanical lift: Stedy   Locomotion Ambulation Ambulation activity did not occur: Safety/medical concerns   Max distance: 11 ft Assist level: 2 helpers (2nd person for w/c follow)   Wheelchair   Type: Manual Max wheelchair distance: 10' Assist Level: Touching or steadying assistance (Pt > 75%)  Cognition Comprehension Comprehension assist level: Follows basic conversation/direction with no assist  Expression Expression assist level: Expresses basic needs/ideas: With no assist  Social Interaction Social Interaction assist level: Interacts appropriately with others with medication or extra time (anti-anxiety, antidepressant).  Problem Solving Problem solving assist level: Solves basic 90% of the time/requires cueing < 10% of the time  Memory Memory assist level: Recognizes or recalls 90% of the time/requires cueing < 10% of the time    Medical Problem List and Plan: 1.  Deficits in mobility and ability to carry out ADL tasks secondary to  CIM  Cont CIR 2.  DVT Prophylaxis/Anticoagulation: eliquis resumed    3. Pain Management: tylenol prn 4. Mood: LCSW to follow for evaluation and support.  5. Neuropsych: This patient is capable of making decisions on her own behalf. 6. Skin/Wound Care: routine pressure relief measures.   -vac for LLE---low output currnetly--mgt per plastics.  7. Fluids/Electrolytes/Nutrition: Strict I/Os.   8.  Hypotension: Monitor orthostatic BP. Added abdominal binder and TEDs to help with BP support.    Vitals:   05/30/17 1500 05/31/17 0500  BP: 116/90 100/77  Pulse: (!) 128 (!) 111  Resp: 16 17  Temp: 97.6 F (36.4 C) 98.2 F (36.8 C)  SpO2:  99%   Controlled 8/17 9. Paroxysmal Afib:  on eliquis for stroke prevention. Continue amiodarone bid   Cardizem increased to 180 on 8/16.   Cont to monitor 10. Leukocytosis: Resolved              Patient with ulcers--continue with WOC recs/daily dressing             Afebrile             Cont to monitor 11. Anemia of chronic disease: On epogen  MWF.  Hb 8.4 on 8/21--suspect some of tachycardia and fatigue is related to this             Cont to montior with HD labs 12. Left foot/leg ulcer:   -continue dressing changes/wound care/vac   Debrided 8/6, again 8/13  S/p debridement/Acell placement to left leg wound/vac 8/20 by Dr. [name] 13. Anxiety/depression: On Seroquel and Zoloft at nights.  14. Prediabetes: CBG (last 3)   Recent Labs  05/30/17 1706 05/30/17 2122 05/31/17 0623  GLUCAP 85 106* 86   Relatively controlled   15. CKD now HD dependent: HD on MWF at end of day to help with tolerance of therapy. Renal diet--Reducate patient on appropriate diet.  16. Vaginal bleeding  Per PCP no issues prior  Gyn consulted, Megace initiated, transvaginal U/S showing endometrial thickening and mass.  Contacted Gyn, per recs, outpt workup to rule out CA.  HGB with next HD     LOS (Days) 24 A FACE TO FACE EVALUATION WAS  PERFORMED  Lorrain Rivers T 05/31/2017 9:29 AM

## 2017-05-31 NOTE — Progress Notes (Signed)
Subjective: Interval History: has no complaint .  Objective: Vital signs in last 24 hours: Temp:  [97.6 F (36.4 C)-98.2 F (36.8 C)] 98.2 F (36.8 C) (08/23 0500) Pulse Rate:  [111-128] 111 (08/23 0500) Resp:  [16-17] 17 (08/23 0500) BP: (100-116)/(77-90) 100/77 (08/23 0500) SpO2:  [99 %] 99 % (08/23 0500) Weight:  [65.9 kg (145 lb 4.5 oz)] 65.9 kg (145 lb 4.5 oz) (08/23 0500) Weight change: 0.2 kg (7.1 oz)  Intake/Output from previous day: 08/22 0701 - 08/23 0700 In: 736 [P.O.:736] Out: -  Intake/Output this shift: No intake/output data recorded.  General appearance: alert, cooperative, no distress and pale Resp: dullness to percussion RLL and rales RLL Chest wall: RIJ cath Breasts: not eval GI: soft, liver down 5 cm,pos bs Extremities: edema 1+ and dressing on L calf  CV irreg Gr 2/6 M  Lab Results:  Recent Labs  05/29/17 0459 05/29/17 1616  WBC 9.4 8.6  HGB 8.2* 8.4*  HCT 26.8* 27.2*  PLT 192 205   BMET:  Recent Labs  05/29/17 1614 05/29/17 1616  NA 136  --   K 4.6  --   CL 100*  --   CO2 23  --   GLUCOSE 123*  --   BUN 46*  --   CREATININE 4.31*  --   CALCIUM 8.9 8.7    Recent Labs  05/29/17 1616  PTH 138*  Comment   Iron Studies:  Recent Labs  05/29/17 1616  IRON 51  TIBC 279    Studies/Results: Dg Chest 2 View  Result Date: 05/30/2017 CLINICAL DATA:  CHF. EXAM: CHEST  2 VIEW COMPARISON:  Chest x-ray dated April 21, 2017. FINDINGS: Interval removal of bilateral internal jugular central venous catheters. Interval placement of a tunneled right internal jugular dialysis catheter with the tip at the cavoatrial junction. Interval removal of previously seen enteric tube. Cardiomegaly, unchanged. Mild pulmonary vascular congestion. Mild hazy opacification of the right lung base, likely a combination of pleural effusion and atelectasis. No consolidation or pneumothorax. No acute osseous abnormality. IMPRESSION: 1. Cardiomegaly with mild pulmonary  vascular congestion. 2. Small right pleural effusion with adjacent right lower lobe opacity, likely atelectasis. Electronically Signed   By: Titus Dubin M.D.   On: 05/30/2017 15:44    I have reviewed the patient's current medications.  Assessment/Plan: 1 ESRD vol xs , for HD 2 Anemia esa 3 DM controlled 4 CHF vol xs 5 Afib 6 CM 7 Leg ulcer debride P HD, esa, lower vol, will need perm access    LOS: 24 days   Nelvin Tomb L 05/31/2017,11:37 AM

## 2017-05-31 NOTE — Progress Notes (Signed)
Occupational Therapy Weekly Progress Note  Patient Details  Name: [name] MRN: [id number] Date of Birth: 08/31/1951  Beginning of progress report period: May 23, 2017 End of progress report period: May 31, 2017  Today's Date: 05/31/2017 OT Individual Time: 0700-0757 OT Individual Time Calculation (min): 57 min    Patient has met 0 of 3 short term goals.  Pt is making steady progress towards goals.  Pt currently requires min-mod assist with bathing at bed level to decrease burden of care, currently able to don pants and Rt sock at bed level with min assist to pull pants over hips with rolling.  Pt completes basic transfers with use of slide board and min-mod assist depending on level of fatigue and incline during transfer.  Noted mild decrease in recall of routine tasks upon return from surgery, with repetition expect return of function and recall.  Max to +2 for sit > stand with inability to complete functional tasks in standing at this time.  Pt will benefit from hands on family education prior to d/c home to ensure husband can provide min assist level care.  Patient continues to demonstrate the following deficits: muscle weakness, decreased cardiorespiratoy enduranceand decreased standing balance and decreased balance strategies and therefore will continue to benefit from skilled OT intervention to enhance overall performance with BADL and Reduce care partner burden.  Patient progressing toward long term goals..  Continue plan of care.  OT Short Term Goals Week 3:  OT Short Term Goal 1 (Week 3): Pt will demonstrate ability to pull pants over hips at sit > stand level with mod assist for standing balance OT Short Term Goal 1 - Progress (Week 3): Revised due to lack of progress OT Short Term Goal 2 (Week 3): Pt will complete hygiene post toileting with mod assist for standing balance with use of AE PRN OT Short Term Goal 2 - Progress (Week 3): Revised due to lack of progress OT  Short Term Goal 3 (Week 3): Pt will complete toilet transfers with min assist OT Short Term Goal 3 - Progress (Week 3): Progressing toward goal Week 4:  OT Short Term Goal 1 (Week 4): Pt will complete toilet transfer with min assist with AE PRN OT Short Term Goal 2 (Week 4): Pt will complete toileting hygiene with mod assist with lateral leans vs sit > stand OT Short Term Goal 3 (Week 4): STG = LTGs due to remaining LOS  Skilled Therapeutic Interventions/Progress Updates:    Treatment session with focus on decreasing burden of care with self-care tasks of bathing and dressing.  Pt received upright in bed eating breakfast.  Pt requires increased time with self-feeding secondary to BUE weakness.  LB bathing and dressing completed at bed level with use of bed functions to allow supported sitting while washing to increase reach towards feet.  Pt continues to require assist to bring leg up to achieve figure 4 position to increase success with threading pants and donning sock.  Hand over hand assist to pull pants over hips in sidelying.  Mod assist bed mobility to come up to sitting at EOB.  Slide board transfer to w/c with min assist, requiring mod assist to adjust in w/c.  UB bathing and grooming completed seated at sink, required assist to pull shirt over head when doffing shirt due to BUE weakness.  Pt utilized sink side for UE support when reaching to obtain grooming items.  Therapy Documentation Precautions:  Precautions Precautions: Fall Restrictions Weight Bearing Restrictions: No  General:   Vital Signs: Therapy Vitals Temp: 98.2 F (36.8 C) Temp Source: Oral Pulse Rate: (!) 111 Resp: 17 BP: 100/77 Patient Position (if appropriate): Lying Oxygen Therapy SpO2: 99 % O2 Device: Not Delivered Pain:  Pt with no c/o pain  See Function Navigator for Current Functional Status.   Therapy/Group: Individual Therapy  [name] 05/31/2017, 6:45 AM

## 2017-06-01 ENCOUNTER — Inpatient Hospital Stay (HOSPITAL_COMMUNITY): Payer: Medicare Other | Admitting: Occupational Therapy

## 2017-06-01 ENCOUNTER — Inpatient Hospital Stay (HOSPITAL_COMMUNITY): Payer: Medicare Other | Admitting: Physical Therapy

## 2017-06-01 ENCOUNTER — Inpatient Hospital Stay (HOSPITAL_COMMUNITY): Payer: Medicare Other | Admitting: Speech Pathology

## 2017-06-01 ENCOUNTER — Encounter (HOSPITAL_COMMUNITY): Payer: Self-pay

## 2017-06-01 DIAGNOSIS — I5022 Chronic systolic (congestive) heart failure: Secondary | ICD-10-CM

## 2017-06-01 DIAGNOSIS — I481 Persistent atrial fibrillation: Secondary | ICD-10-CM

## 2017-06-01 LAB — GLUCOSE, CAPILLARY
GLUCOSE-CAPILLARY: 99 mg/dL (ref 65–99)
Glucose-Capillary: 92 mg/dL (ref 65–99)
Glucose-Capillary: 105 mg/dL — ABNORMAL HIGH (ref 65–99)
Glucose-Capillary: 103 mg/dL — ABNORMAL HIGH (ref 65–99)

## 2017-06-01 LAB — PROTIME-INR
INR: 1.49
Prothrombin Time: 18.2 seconds — ABNORMAL HIGH (ref 11.4–15.2)

## 2017-06-01 MED ORDER — METOPROLOL TARTRATE 25 MG PO TABS
25.0000 mg | ORAL_TABLET | Freq: Once | ORAL | Status: AC
  Administered 2017-06-01: 25 mg via ORAL
  Filled 2017-06-01: qty 1

## 2017-06-01 MED ORDER — METOPROLOL SUCCINATE ER 25 MG PO TB24: 25.0000 mg | ORAL_TABLET | Freq: Every day | ORAL | Status: DC

## 2017-06-01 MED ORDER — WARFARIN SODIUM 5 MG PO TABS
5.0000 mg | ORAL_TABLET | Freq: Once | ORAL | Status: AC
  Administered 2017-06-01: 5 mg via ORAL
  Filled 2017-06-01: qty 1

## 2017-06-01 MED ORDER — ATENOLOL 12.5 MG HALF TABLET: 12.5000 mg | ORAL_TABLET | Freq: Every day | ORAL | Status: DC

## 2017-06-01 MED ORDER — AMIODARONE HCL 200 MG PO TABS
200.0000 mg | ORAL_TABLET | Freq: Three times a day (TID) | ORAL | Status: DC
  Administered 2017-06-01 – 2017-06-02 (×4): 200 mg via ORAL
  Filled 2017-06-01 (×4): qty 1

## 2017-06-01 MED ORDER — METOPROLOL TARTRATE 12.5 MG HALF TABLET
12.5000 mg | ORAL_TABLET | Freq: Two times a day (BID) | ORAL | Status: DC
  Administered 2017-06-02: 12.5 mg via ORAL
  Filled 2017-06-01 (×2): qty 1

## 2017-06-01 NOTE — Progress Notes (Signed)
ANTICOAGULATION CONSULT NOTE - Follow Up Consult  Pharmacy Consult for coumadin Indication: atrial fibrillation  No Known Allergies  Patient Measurements: Height: 5\' 5"  (165.1 cm) Weight: 122 lb 5.7 oz (55.5 kg) IBW/kg (Calculated) : 57 Heparin Dosing Weight:   Vital Signs: Temp: 98.8 F (37.1 C) (08/24 0515) Temp Source: Oral (08/24 0515) BP: 112/77 (08/24 0515) Pulse Rate: 121 (08/24 0515)  Labs:  Recent Labs  05/29/17 1614 05/29/17 1616 05/30/17 1503 05/31/17 0605 05/31/17 2233  HGB  --  8.4*  --   --  8.3*  HCT  --  27.2*  --   --  26.3*  PLT  --  205  --   --  221  LABPROT  --   --  18.2* 17.6*  --   INR  --   --  1.49 1.43  --   CREATININE 4.31*  --   --   --  3.32*    Estimated Creatinine Clearance: 14.8 mL/min (A) (by C-G formula based on SCr of 3.32 mg/dL (H)).   Medications:  Scheduled:  . amiodarone  200 mg Oral BID  . darbepoetin (ARANESP) injection - DIALYSIS  150 mcg Intravenous Q Thu-HD  . famotidine  20 mg Oral QHS  . feeding supplement (PRO-STAT SUGAR FREE 64)  30 mL Oral BID  . hydrocerin   Topical BID  . levothyroxine  100 mcg Oral QAC breakfast  . megestrol  40 mg Oral BID  . midodrine  10 mg Oral TID WC  . multivitamin  1 tablet Oral QHS  . QUEtiapine  25 mg Oral QHS  . senna-docusate  2 tablet Oral QHS  . sertraline  25 mg Oral QHS  . Warfarin - Pharmacist Dosing Inpatient   Does not apply q1800   Infusions:  . sodium chloride    . sodium chloride    . ferric gluconate (FERRLECIT/NULECIT) IV Stopped (06/01/17 0177)    Assessment: 66 yo female with afib is currently on subtherapeutic coumadin.  INR today is 1.49.  On amiodarone   Goal of Therapy:  INR 2-3 Monitor platelets by anticoagulation protocol: Yes   Plan:  Coumadin 5 mg po x1 INR in am  Berlyn Malina, Tsz-Yin 06/01/2017,8:17 AM

## 2017-06-01 NOTE — Progress Notes (Signed)
HD tx completed @ 0100 w/o problem other than almost clotting off in the last 10 min of tx, UF goal met, blood rinsed back, VSS, report called to [employer], RN and also advised about discrepancy w/ wt using bed scale as pt can't stand and weigh in here d/t leg issue, suggested they re zero bed and find out if it is with or w/o the air pump, cage and wound vac that are attached to bed and suggested they may get PT to help her stand on standing scale while in PT today and try and get a baseline wt to compare to the bed scale once it is re zeroed

## 2017-06-01 NOTE — Progress Notes (Signed)
Speech Language Pathology Daily Session Note  Patient Details  Name: [name] MRN: [id number] Date of Birth: 1951-09-16  Today's Date: 06/01/2017 SLP Individual Time: 1305-1400 SLP Individual Time Calculation (min): 55 min  Short Term Goals: Week 1: SLP Short Term Goal 1 (Week 1): STG=LTG due to remaining LOS  Skilled Therapeutic Interventions:  Pt was seen for skilled ST targeting cognitive goals.  SLP facilitated the session with medication and money management tasks to address memory and problem solving in a functional, familiar context.   Pt neeeded x1 supervision verbal cue to recognize and correct errors when writing a check and balancing a checkbook, but was otherwise mod I during task.  Pt was able to recall function of her currently scheduled medications when named for ~50% accuracy with supervision assist.  A written list of medications was provided to pt to maximize carryover in the home environment. She then organized pills into a pill box with mod I for 100% accuracy.  Discussed recommendation that pt have assistance for medication and financial management at discharge.  Pt reports that husband will be able to provide recommended level of assist.  SLP also provided skilled education regarding pursed lip breathing and relaxation techniques to better manage anxiety.  Pt was left in wheelchair with call bell within reach.  Continue per current plan of care.    Function:  Eating Eating                 Cognition Comprehension Comprehension assist level: Follows complex conversation/direction with extra time/assistive device  Expression   Expression assist level: Expresses complex ideas: With extra time/assistive device  Social Interaction Social Interaction assist level: Interacts appropriately with others with medication or extra time (anti-anxiety, antidepressant).  Problem Solving Problem solving assist level: Solves complex problems: With extra time  Memory Memory assist  level: More than reasonable amount of time    Pain Pain Assessment Pain Assessment: No/denies pain  Therapy/Group: Individual Therapy  [name], Selinda Orion 06/01/2017, 4:08 PM

## 2017-06-01 NOTE — Progress Notes (Signed)
[facility] PHYSICAL MEDICINE & REHABILITATION     PROGRESS NOTE  Subjective/Complaints:  Had a long night with late HD. Going to work through fatigue today  ROS: pt denies nausea, vomiting, diarrhea, cough, shortness of breath or chest pain   Objective: Vital Signs: Blood pressure 112/77, pulse (!) 121, temperature 98.8 F (37.1 C), temperature source Oral, resp. rate 17, height 5\' 5"  (1.651 m), weight 55.5 kg (122 lb 5.7 oz), SpO2 99 %. Dg Chest 2 View  Result Date: 05/30/2017 CLINICAL DATA:  CHF. EXAM: CHEST  2 VIEW COMPARISON:  Chest x-ray dated April 21, 2017. FINDINGS: Interval removal of bilateral internal jugular central venous catheters. Interval placement of a tunneled right internal jugular dialysis catheter with the tip at the cavoatrial junction. Interval removal of previously seen enteric tube. Cardiomegaly, unchanged. Mild pulmonary vascular congestion. Mild hazy opacification of the right lung base, likely a combination of pleural effusion and atelectasis. No consolidation or pneumothorax. No acute osseous abnormality. IMPRESSION: 1. Cardiomegaly with mild pulmonary vascular congestion. 2. Small right pleural effusion with adjacent right lower lobe opacity, likely atelectasis. Electronically Signed   By: Titus Dubin M.D.   On: 05/30/2017 15:44    Recent Labs  05/29/17 1616 05/31/17 2233  WBC 8.6 7.8  HGB 8.4* 8.3*  HCT 27.2* 26.3*  PLT 205 221    Recent Labs  05/29/17 1614 05/29/17 1616 05/31/17 2233  NA 136  --  136  K 4.6  --  3.9  CL 100*  --  98*  GLUCOSE 123*  --  86  BUN 46*  --  31*  CREATININE 4.31*  --  3.32*  CALCIUM 8.9 8.7 8.7*   CBG (last 3)   Recent Labs  05/31/17 2014 06/01/17 0133 06/01/17 0617  GLUCAP 89 99 92    Wt Readings from Last 3 Encounters:  06/01/17 55.5 kg (122 lb 5.7 oz)  05/07/17 75.3 kg (166 lb 0.1 oz)    Physical Exam:  BP 112/77 (BP Location: Right Arm)   Pulse (!) 121   Temp 98.8 F (37.1 C) (Oral)   Resp  17   Ht 5\' 5"  (1.651 m)   Wt 55.5 kg (122 lb 5.7 oz)   SpO2 99%   BMI 20.36 kg/m  Constitutional: She appears well-developed and well-nourished.  HENT: Normocephalic. Atraumatic. Eyes: EOMI. No discharge.  Cardiovascular: IRR/IRR tachy Respiratory: CTA Bilaterally without wheezes or rales. Normal effort   .  GI: Soft. Bowel sounds are normal.   Musculoskeletal: She exhibits edema and tenderness.  Neurological: She is alert and oriented.  Able to follow commands without difficulty.  Motor: B/l UE 4/5 proximal to distal RLE: HF 3/5, KE 3+/5, ADF/PF 4/5  LLE: HF 3-/5, KE 3/5, ADF/PF limited by dressing/wound  Skin: LLE with vac. Small amount of drainage in cannister  Psychiatric: She has a normal mood and affect. Her behavior is normal. Thought content normal.    Assessment/Plan: 1. 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 Physiatrist is providing close team supervision and 24 hour management of active medical problems listed below. Physiatrist and rehab team continue to assess barriers to discharge/monitor patient progress toward functional and medical goals.  Function:  Bathing Bathing position   Position: Bed  Bathing parts Body parts bathed by patient: Right arm, Left arm, Chest, Abdomen, Right upper leg, Left upper leg, Right lower leg, Front perineal area Body parts bathed by helper:  Back, Buttocks  Bathing assist Assist Level: Touching or steadying assistance(Pt > 75%)      Upper Body Dressing/Undressing Upper body dressing   What is the patient wearing?: Pull over shirt/dress     Pull over shirt/dress - Perfomed by patient: Put head through opening, Thread/unthread left sleeve, Thread/unthread right sleeve, Pull shirt over trunk Pull over shirt/dress - Perfomed by helper: Pull shirt over trunk        Upper body assist Assist Level: Set up   Set up : To obtain  clothing/put away  Lower Body Dressing/Undressing Lower body dressing   What is the patient wearing?: Pants, Non-skid slipper socks     Pants- Performed by patient: Thread/unthread right pants leg Pants- Performed by helper: Thread/unthread left pants leg, Pull pants up/down Non-skid slipper socks- Performed by patient: Don/doff right sock Non-skid slipper socks- Performed by helper: Don/doff right sock                  Lower body assist Assist for lower body dressing:  (Mod assist)      Toileting Toileting Toileting activity did not occur: N/A (anuric)   Toileting steps completed by helper: Adjust clothing prior to toileting, Performs perineal hygiene, Adjust clothing after toileting    Toileting assist Assist level: Two helpers (+2 for clothing managment only)   Transfers Chair/bed transfer   Chair/bed transfer method: Lateral scoot Chair/bed transfer assist level: Touching or steadying assistance (Pt > 75%) Chair/bed transfer assistive device: Sliding board Mechanical lift: Stedy   Locomotion Ambulation Ambulation activity did not occur: Safety/medical concerns   Max distance: 11 ft Assist level: 2 helpers (2nd person for w/c follow)   Wheelchair   Type: Manual Max wheelchair distance: 10' Assist Level: Touching or steadying assistance (Pt > 75%)  Cognition Comprehension Comprehension assist level: Understands complex 90% of the time/cues 10% of the time  Expression Expression assist level: Expresses complex ideas: With extra time/assistive device  Social Interaction Social Interaction assist level: Interacts appropriately with others with medication or extra time (anti-anxiety, antidepressant).  Problem Solving Problem solving assist level: Solves complex 90% of the time/cues < 10% of the time  Memory Memory assist level: Recognizes or recalls 90% of the time/requires cueing < 10% of the time    Medical Problem List and Plan: 1.  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  DVT Prophylaxis/Anticoagulation: eliquis stopped in favor of coumadin by nephrology    3. Pain Management: tylenol prn 4. Mood: LCSW to follow for evaluation and support.  5. Neuropsych: This patient is capable of making decisions on her own behalf. 6. Skin/Wound Care: routine pressure relief measures.   -vac for LLE---continue per plastics---they will follow up ?Monday 7. Fluids/Electrolytes/Nutrition: Strict I/Os.   8.  Hypotension: Monitor orthostatic BP. Added abdominal binder and TEDs to help with BP support.    Vitals:   06/01/17 0130 06/01/17 0515  BP: 110/78 112/77  Pulse: (!) 117 (!) 121  Resp:    Temp: 98.5 F (36.9 C) 98.8 F (37.1 C)  SpO2: 100% 99%   Controlled 8/17 9. Paroxysmal Afib:  on eliquis for stroke prevention. Continue amiodarone bid   Cardizem stopped by renal d/t BP. On coumadin too  Probably needs to resume low dose CCB for rate control. Will d/w renal 10. Leukocytosis: Resolved              Patient with ulcers--continue with WOC recs/daily dressing  Afebrile             Cont to monitor 11. Anemia of chronic disease: On epogen  MWF.              Hb 8.3 on 8/24-               Cont to montior with HD labs 12. Left foot/leg ulcer:   -continue dressing changes/wound care/vac   Debrided 8/6, again 8/13  S/p debridement/Acell placement to left leg wound/vac 8/20 by Dr. [name] 13. Anxiety/depression: On Seroquel and Zoloft at nights.  14. Prediabetes: CBG (last 3)   Recent Labs  05/31/17 2014 06/01/17 0133 06/01/17 0617  GLUCAP 89 99 92   Relatively controlled   15. CKD now HD dependent: HD on MWF at end of day to help with tolerance of therapy. Renal diet--Reducate patient on appropriate diet.  16. Vaginal bleeding  Per PCP no issues prior  Gyn consulted, Megace initiated, transvaginal U/S showing endometrial thickening and mass.  Contacted Gyn, per recs, outpt workup to  rule out CA.  HGB with next HD     LOS (Days) 25 A FACE TO FACE EVALUATION WAS PERFORMED  Kristion Holifield T 06/01/2017 9:10 AM

## 2017-06-01 NOTE — Progress Notes (Signed)
Nutrition Follow-up  DOCUMENTATION CODES:   Not applicable  INTERVENTION:  Continue 30 ml Prostat po BID, each supplement provides 100 kcal and 15 grams of protein.   Encourage adequate PO intake.   NUTRITION DIAGNOSIS:   Increased nutrient needs related to chronic illness as evidenced by estimated needs; ongoing  GOAL:   Patient will meet greater than or equal to 90% of their needs; met  MONITOR:   PO intake, Supplement acceptance, Labs, Weight trends, Skin, I & O's  REASON FOR ASSESSMENT:   Consult Diet education  ASSESSMENT:   66 y.o. female with history of HTN, A fib-on pradaxa who was admitted on [facility] on 04/08/17 due to BLE cellulitis with difficulty walking. She was found to be septic with leucocytosis and hypotensive. CRRT initiated and  hospital course significant for PEA arrest 7/4, ARDS, anuria requiring  IJ placement by vascular surgery as now HD dependent, shocked liver with abnormal LFTs, thrombocytopenia, ongoing issues with hypotension and tachycardia. 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  Meal completion recently has been 90-100%. Pt currently has Prostat ordered with varied consumption. Intake has been adequate. RD to continue with current orders to aid in adequate protein. Labs and medications reviewed.   Diet Order:  Diet renal/carb modified with fluid restriction Diet-HS Snack? Nothing; Room service appropriate? Yes; Fluid consistency: Thin  Skin:  Wound (see comment) (Incision on L leg with wound VAC)  Last BM:  8/22  Height:   Ht Readings from Last 1 Encounters:  05/07/17 '5\' 5"'  (1.651 m)    Weight:   Wt Readings from Last 1 Encounters:  06/01/17 122 lb 5.7 oz (55.5 kg)    Ideal Body Weight:  56.8 kg  BMI:  Body mass index is 20.36 kg/m.  Estimated Nutritional Needs:   Kcal:  1800-2000  Protein:  90-110 grams  Fluid:  1.2 L/day  EDUCATION NEEDS:   Education needs  addressed  Corrin Parker, MS, RD, LDN Pager # [phone] After hours/ weekend pager # [phone]

## 2017-06-01 NOTE — Progress Notes (Signed)
Subjective: Interval History: has no complaint .  Objective: Vital signs in last 24 hours: Temp:  [97.9 F (36.6 C)-98.8 F (37.1 C)] 98.8 F (37.1 C) (08/24 0515) Pulse Rate:  [65-135] 121 (08/24 0515) Resp:  [10-17] 17 (08/24 0107) BP: (84-112)/(30-85) 112/77 (08/24 0515) SpO2:  [92 %-100 %] 99 % (08/24 0515) Weight:  [55.5 kg (122 lb 5.7 oz)] 55.5 kg (122 lb 5.7 oz) (08/24 0623) Weight change: -10.4 kg (-22 lb 14.9 oz)  Intake/Output from previous day: 08/23 0701 - 08/24 0700 In: 360 [P.O.:360] Out: 2500  Intake/Output this shift: Total I/O In: 328 [P.O.:328] Out: -   General appearance: alert, cooperative, no distress and pale Resp: dullness to percussion RLL and rales RLL Chest wall: RIJ cath Cardio: irregularly irregular rhythm and rate 120s GI: soft, non-tender; bowel sounds normal; no masses,  no organomegaly Extremities: dressing LLE  Lab Results:  Recent Labs  05/29/17 1616 05/31/17 2233  WBC 8.6 7.8  HGB 8.4* 8.3*  HCT 27.2* 26.3*  PLT 205 221   BMET:  Recent Labs  05/29/17 1614 05/29/17 1616 05/31/17 2233  NA 136  --  136  K 4.6  --  3.9  CL 100*  --  98*  CO2 23  --  26  GLUCOSE 123*  --  86  BUN 46*  --  31*  CREATININE 4.31*  --  3.32*  CALCIUM 8.9 8.7 8.7*    Recent Labs  05/29/17 1616  PTH 138*  Comment   Iron Studies:  Recent Labs  05/29/17 1616  IRON 51  TIBC 279    Studies/Results: Dg Chest 2 View  Result Date: 05/30/2017 CLINICAL DATA:  CHF. EXAM: CHEST  2 VIEW COMPARISON:  Chest x-ray dated April 21, 2017. FINDINGS: Interval removal of bilateral internal jugular central venous catheters. Interval placement of a tunneled right internal jugular dialysis catheter with the tip at the cavoatrial junction. Interval removal of previously seen enteric tube. Cardiomegaly, unchanged. Mild pulmonary vascular congestion. Mild hazy opacification of the right lung base, likely a combination of pleural effusion and atelectasis. No  consolidation or pneumothorax. No acute osseous abnormality. IMPRESSION: 1. Cardiomegaly with mild pulmonary vascular congestion. 2. Small right pleural effusion with adjacent right lower lobe opacity, likely atelectasis. Electronically Signed   By: Titus Dubin M.D.   On: 05/30/2017 15:44    I have reviewed the patient's current medications.  Assessment/Plan: 1 ESRD for HD tomorrow.  Will set up for perm access next week. Still vol xs 2 Anemia esa 3 HPTH 4 Afib rapid, on Amio, start beta blocker.  bp low avoid CCB. 5 DM  6 PVD ulce 7 Debill; P Lopressor, avoid lowering bps,  Hd in am   LOS: 25 days   Ife Vitelli L 06/01/2017,12:33 PM

## 2017-06-01 NOTE — Progress Notes (Signed)
Occupational Therapy Session Note  Patient Details  Name: [name] MRN: [id number] Date of Birth: 1951/02/04  Today's Date: 06/01/2017 OT Individual Time: 0815-0900 OT Individual Time Calculation (min): 45 min    Short Term Goals: Week 4:  OT Short Term Goal 1 (Week 4): Pt will complete toilet transfer with min assist with AE PRN OT Short Term Goal 2 (Week 4): Pt will complete toileting hygiene with mod assist with lateral leans vs sit > stand OT Short Term Goal 3 (Week 4): STG = LTGs due to remaining LOS  Skilled Therapeutic Interventions/Progress Updates:    Treatment session with focus on LB self-care tasks and transfers.  Completed LB bathing at bed level with pt demonstrating increased ability to roll to wash buttocks this session.  Therapist assisted to obtain figure 4 position to allow pt to attempt threading pant legs and donning Rt sock.  Mod assist to come up to sitting at EOB.  +2 assist sit > stand to pull pants up in standing, max multimodal cues for anterior weight shift and technique with sit > stand.  Pt unable to achieve complete upright standing posture this session.  Squat pivot transfer to w/c with mod assist for lifting.  Pt able to complete UB bathing/dressing and grooming tasks with setup assist.  Therapy Documentation Precautions:  Precautions Precautions: Fall Restrictions Weight Bearing Restrictions: No Pain: Pain Assessment Pain Assessment: No/denies pain Pain Score: 0-No pain  See Function Navigator for Current Functional Status.   Therapy/Group: Individual Therapy  [name] 06/01/2017, 11:23 AM

## 2017-06-01 NOTE — Progress Notes (Signed)
Physical Therapy Session Note  Patient Details  Name: [name] MRN: [id number] Date of Birth: 07/23/1951  Today's Date: 06/01/2017 PT Individual Time: 7606-6785 PT Individual Time Calculation (min): 53 min   Short Term Goals: Week 4:  PT Short Term Goal 1 (Week 4): STG=LTG  Skilled Therapeutic Interventions/Progress Updates:    no c/o pain but reporting need to toilet.  Requires encouragement to use BSC over toilet versus transferring back to bed and using bedpan.    Pt requires +2 assist for slide board transfer w/c>BSC over toilet.  Lateral leans and max assist for clothing management.  Pt requires increased time for continent bowel, nurse in for disimpaction.  Slide board transfer back to w/c with max assist.  Pt requires +2 assist for sit<>stand from w/c with grab bar and assist to pull pants over hips.    PT instructed pt in BLE therex with 3# ankle weight 2x10 reps hip flexion, LAQ, hip adduction, and hip abduction.  Pt returned to room at end of session, positioned in w/c with call bell in reach and needs met.   Therapy Documentation Precautions:  Precautions Precautions: Fall Restrictions Weight Bearing Restrictions: No   See Function Navigator for Current Functional Status.   Therapy/Group: Individual Therapy  Michel Santee 06/01/2017, 2:55 PM

## 2017-06-01 NOTE — Progress Notes (Signed)
Speech Language Pathology Discharge Summary  Patient Details  Name: [name] MRN: [id number] Date of Birth: 09/24/1951   Patient has met 2 of 2 long term goals.  Patient to discharge at overall Modified Independent level.  Reasons goals not met:     Clinical Impression/Discharge Summary:  Pt is discharging from [place] services having met 2 out of 2 long term goals.  Pt is currently mod I for semi-complex cognitive tasks but may at times need increased cuing due to anxiety which impacts her ability to process and store information.  Recommend that pt have assistance for medication and financial management at discharge, which pt reports husband will be willing and able to provide.  Pt education is complete.  As a result, no further ST services are indicated at this time.     Care Partner:  Caregiver Able to Provide Assistance:  (n/a)  Type of Caregiver Assistance:  (n/a)  Recommendation:  None      Equipment: none recommended by SLP    Reasons for discharge: Treatment goals met   Patient/Family Agrees with Progress Made and Goals Achieved: Yes   Function:  Eating Eating                 Cognition Comprehension Comprehension assist level: Follows complex conversation/direction with extra time/assistive device  Expression   Expression assist level: Expresses complex ideas: With extra time/assistive device  Social Interaction Social Interaction assist level: Interacts appropriately with others with medication or extra time (anti-anxiety, antidepressant).  Problem Solving Problem solving assist level: Solves complex problems: With extra time  Memory Memory assist level: More than reasonable amount of time   Emilio Math 06/01/2017, 4:11 PM

## 2017-06-01 NOTE — Plan of Care (Signed)
Problem: RH BOWEL ELIMINATION Goal: RH STG MANAGE BOWEL WITH ASSISTANCE STG Manage Bowel with min Assistance.  Outcome: Progressing No bowel movement this shift  Problem: RH SKIN INTEGRITY Goal: RH STG SKIN FREE OF INFECTION/BREAKDOWN Patients skin will remain free from further infection or breakdown with mod assist.  Outcome: Progressing Wound vac dressing to LLE clean, dry and intact

## 2017-06-01 NOTE — Progress Notes (Signed)
Occupational Therapy Session Note  Patient Details  Name: [name] MRN: [id number] Date of Birth: [birth year]  Today's Date: 06/01/2017 OT Individual Time: 1115-1200 OT Individual Time Calculation (min): 45 min    Short Term Goals: Week 4:  OT Short Term Goal 1 (Week 4): Pt will complete toilet transfer with min assist with AE PRN OT Short Term Goal 2 (Week 4): Pt will complete toileting hygiene with mod assist with lateral leans vs sit > stand OT Short Term Goal 3 (Week 4): STG = LTGs due to remaining LOS     Skilled Therapeutic Interventions/Progress Updates:    Pt agreeable to practicing toileting skills this session. Drop arm BSC placed over toilet and height adjusted slightly lower.  With a bench step for foot support and a sliding board pt transferred onto toilet with min A.  She utilized lateral leans and was able to doff her pants over hips 70% of the way.  Pt was somewhat constipated and RN called in for a suppository.  Used lateral leans to don hospital brief and pants over hips with total A.  She then used board to get back into w/c with min A.  Pt resting in w/c with all needs met.   Therapy Documentation Precautions:  Precautions Precautions: Fall Restrictions Weight Bearing Restrictions: No      Pain: Pain Assessment Pain Assessment: No/denies pain Pain Score: 0-No pain ADL:    See Function Navigator for Current Functional Status.   Therapy/Group: Individual Therapy  [place] 06/01/2017, 12:18 PM

## 2017-06-01 NOTE — Consult Note (Signed)
Cardiology Consultation:   Patient ID: [name]; [id number]; 1950-12-20   Admit date: 05/07/2017 Date of Consult: 06/01/2017  Primary Care Provider: Tracie Harrier, MD Primary Cardiologist: Dr. Nehemiah Massed    Patient Profile:   [name] is a 66 y.o. female with a hx of end-stage renal disease, dilated cardiomyopathy ejection fraction 25%, prolonged hospitalization hypotension who is being seen today for the evaluation of atrial fibrillation with increased rate at the request of Dr. Jimmy Footman.  History of Present Illness:   [name] is a 66 year old female who has been hospitalized for over 25 days with end-stage renal disease on hemodialysis, atrial fibrillation, diabetes, peripheral vascular disease, debilitation with recent episodes of hypotension. Diltiazem was discontinued because of hypotension. Amiodarone was being utilized to help with rate control. Lopressor also was recently held. Blood pressure has been as low as 84/30. Ost recent blood pressure is 123/79. Heart rate however has been from 125-153. She does have a dilated cardiomyopathy with ejection fraction of 25%.  At home she was taking amiodarone 200 mg twice a day as she is currently taking. She has Midodrin scheduled as well 10 mg 3 times a day.  She states that she is not feeling CP or SOB or tachycardia. She believes her heart rate has been elevated after recent anesthesia for leg wound debridement.   Past Medical History:  Diagnosis Date  . A-fib ([place])    on pradaxa  . Chronic kidney disease   . Dysrhythmia   . Hypertension   . Hypothyroidism   . Thyroid disease     Past Surgical History:  Procedure Laterality Date  . APPLICATION OF A-CELL OF EXTREMITY Left 05/28/2017   Procedure: APPLICATION OF A-CELL OF LEFT LOWER EXTREMITY;  Surgeon: Wallace Going, DO;  Location: [place];  Service: Plastics;  Laterality: Left;  . APPLICATION OF WOUND VAC Left 05/28/2017   Procedure: APPLICATION OF WOUND VAC;   Surgeon: Wallace Going, DO;  Location: [place];  Service: Plastics;  Laterality: Left;  . DIALYSIS/PERMA CATHETER INSERTION N/A 04/26/2017   Procedure: Dialysis/Perma Catheter Insertion;  Surgeon: Algernon Huxley, MD;  Location: [facility];  Service: Cardiovascular;  Laterality: N/A;  . INCISION AND DRAINAGE OF WOUND Left 05/28/2017   Procedure: IRRIGATION AND DEBRIDEMENT LEFT LOWER LEG WOUND;  Surgeon: Wallace Going, DO;  Location: [place];  Service: Plastics;  Laterality: Left;  . PR DEBRIDEMENT, SKIN, SUB-Q TISSUE,=<20 SQ CM  05/21/2017      . PR DEBRIDEMENT, SKIN, SUB-Q TISSUE,EACH ADD 20 SQ CM  05/21/2017      . WRIST ARTHROSCOPY       Home Medications:  Prior to Admission medications   Medication Sig Start Date End Date Taking? Authorizing Provider  ALPRAZolam Duanne Moron) 0.25 MG tablet Take 0.25 mg by mouth at bedtime as needed for anxiety.    [provider]  amiodarone (PACERONE) 200 MG tablet Take 1 tablet (200 mg total) by mouth 2 (two) times daily. 05/07/17   Hillary Bow, MD  apixaban (ELIQUIS) 2.5 MG TABS tablet Take 1 tablet (2.5 mg total) by mouth 2 (two) times daily. 05/07/17   Hillary Bow, MD  diltiazem (TIAZAC) 180 MG 24 hr capsule Take 1 capsule (180 mg total) by mouth daily. 05/07/17   Hillary Bow, MD  famotidine (PEPCID) 20 MG tablet Take 1 tablet (20 mg total) by mouth at bedtime. 05/07/17   Hillary Bow, MD  levothyroxine (SYNTHROID, LEVOTHROID) 50 MCG tablet Take 50 mcg by mouth  daily before breakfast.    [provider]  midodrine (PROAMATINE) 5 MG tablet Take 1 tablet (5 mg total) by mouth 3 (three) times daily with meals. 05/07/17   Hillary Bow, MD  sertraline (ZOLOFT) 25 MG tablet Take 25 mg by mouth daily.    [provider]    Inpatient Medications: Scheduled Meds: . amiodarone  200 mg Oral BID  . darbepoetin (ARANESP) injection - DIALYSIS  150 mcg Intravenous Q Thu-HD  . famotidine  20 mg Oral QHS  . feeding  supplement (PRO-STAT SUGAR FREE 64)  30 mL Oral BID  . hydrocerin   Topical BID  . levothyroxine  100 mcg Oral QAC breakfast  . megestrol  40 mg Oral BID  . metoprolol succinate  25 mg Oral QHS  . midodrine  10 mg Oral TID WC  . multivitamin  1 tablet Oral QHS  . QUEtiapine  25 mg Oral QHS  . senna-docusate  2 tablet Oral QHS  . sertraline  25 mg Oral QHS  . warfarin  5 mg Oral ONCE-1800  . Warfarin - Pharmacist Dosing Inpatient   Does not apply q1800   Continuous Infusions: . sodium chloride    . sodium chloride    . ferric gluconate (FERRLECIT/NULECIT) IV Stopped (06/01/17 0058)   PRN Meds: sodium chloride, sodium chloride, acetaminophen, alteplase, aluminum hydroxide, artificial tears, bisacodyl, diphenhydrAMINE, guaiFENesin-dextromethorphan, heparin, heparin, heparin, ipratropium-albuterol, lidocaine (PF), lidocaine-prilocaine, ondansetron (ZOFRAN) IV, pentafluoroprop-tetrafluoroeth, phenol, polyethylene glycol, prochlorperazine **OR** prochlorperazine **OR** prochlorperazine, zinc oxide  Allergies:   No Known Allergies  Social History:   Social History   Social History  . Marital status: Married    Spouse name: N/A  . Number of children: N/A  . Years of education: N/A   Occupational History  . Not on file.   Social History Main Topics  . Smoking status: Never Smoker  . Smokeless tobacco: Never Used  . Alcohol use No  . Drug use: No  . Sexual activity: Not Currently   Other Topics Concern  . Not on file   Social History Narrative  . No narrative on file    Family History:    Family History  Problem Relation Age of Onset  . AAA (abdominal aortic aneurysm) Mother      ROS:  Please see the history of present illness. No bleeding, no syncope, no chest pain no shortness of breath ROS  All other ROS reviewed and negative.     Physical Exam/Data:   Vitals:   06/01/17 0515 06/01/17 0623 06/01/17 1500 06/01/17 1600  BP: 112/77  123/79   Pulse: (!) 121  (!)  153 (!) 125  Resp:   17   Temp: 98.8 F (37.1 C)  98.2 F (36.8 C)   TempSrc: Oral  Oral   SpO2: 99%  100%   Weight:  122 lb 5.7 oz (55.5 kg)    Height:        Intake/Output Summary (Last 24 hours) at 06/01/17 1640 Last data filed at 06/01/17 8295  Gross per 24 hour  Intake              448 ml  Output             2500 ml  Net            -2052 ml   Filed Weights   05/31/17 0500 06/01/17 0623  Weight: 145 lb 4.5 oz (65.9 kg) 122 lb 5.7 oz (55.5 kg)   Body mass  index is 20.36 kg/m.  General:  Pale, pleasant,  in no acute distress HEENT: normal Lymph: no adenopathy Neck: right subcl hemodialysis catheter Endocrine:  No thryomegaly Vascular: No carotid bruits Cardiac:  Irregularly irregular tachycardic; no murmur  Lungs:  clear to auscultation bilaterally, no wheezing, rhonchi or rales  Abd: soft, nontender, no hepatomegaly  Ext: lower extremity dressings noted Musculoskeletal:  No deformities, BUE and BLE strength normal and equal Skin: warm and dry  Neuro:  CNs 2-12 intact, no focal abnormalities noted Psych:  Normal affect   EKG:  The EKG was personally reviewed and demonstrates:  EKG on 05/08/17 shows heart rate of 77 bpm atrial fibrillation with T-wave inversion in the anterolateral leads.   Relevant CV Studies:  Echocardiogram 04/08/17: - Left ventricle: The cavity size was mildly dilated. There was   mild concentric hypertrophy. Systolic function was severely   reduced. The estimated ejection fraction was in the range of 25%   to 30%. - Aortic valve: There was moderate regurgitation. - Mitral valve: There was mild regurgitation. - Left atrium: The atrium was mildly dilated. - Right atrium: The atrium was mildly dilated. - Tricuspid valve: There was moderate regurgitation.   Laboratory Data:  Chemistry Recent Labs Lab 05/28/17 0851 05/29/17 1614 05/29/17 1616 05/31/17 2233  NA 136 136  --  136  K 4.0 4.6  --  3.9  CL  --  100*  --  98*  CO2  --  23   --  26  GLUCOSE 81 123*  --  86  BUN  --  46*  --  31*  CREATININE  --  4.31*  --  3.32*  CALCIUM  --  8.9 8.7 8.7*  GFRNONAA  --  10*  --  14*  GFRAA  --  11*  --  16*  ANIONGAP  --  13  --  12     Recent Labs Lab 05/29/17 1614 05/31/17 2233  ALBUMIN 3.0* 2.9*   Hematology Recent Labs Lab 05/29/17 0459 05/29/17 1616 05/31/17 2233  WBC 9.4 8.6 7.8  RBC 2.52* 2.56* 2.47*  HGB 8.2* 8.4* 8.3*  HCT 26.8* 27.2* 26.3*  MCV 106.3* 106.3* 106.5*  MCH 32.5 32.8 33.6  MCHC 30.6 30.9 31.6  RDW 24.3* 24.4* 24.4*  PLT 192 205 221   Cardiac EnzymesNo results for input(s): TROPONINI in the last 168 hours. No results for input(s): TROPIPOC in the last 168 hours.  BNPNo results for input(s): BNP, PROBNP in the last 168 hours.  DDimer No results for input(s): DDIMER in the last 168 hours.  Radiology/Studies:  Dg Chest 2 View  Result Date: 05/30/2017 CLINICAL DATA:  CHF. EXAM: CHEST  2 VIEW COMPARISON:  Chest x-ray dated April 21, 2017. FINDINGS: Interval removal of bilateral internal jugular central venous catheters. Interval placement of a tunneled right internal jugular dialysis catheter with the tip at the cavoatrial junction. Interval removal of previously seen enteric tube. Cardiomegaly, unchanged. Mild pulmonary vascular congestion. Mild hazy opacification of the right lung base, likely a combination of pleural effusion and atelectasis. No consolidation or pneumothorax. No acute osseous abnormality. IMPRESSION: 1. Cardiomegaly with mild pulmonary vascular congestion. 2. Small right pleural effusion with adjacent right lower lobe opacity, likely atelectasis. Electronically Signed   By: Titus Dubin M.D.   On: 05/30/2017 15:44    Assessment and Plan:   Difficult to control rapid atrial fibrillation  - I agree with discontinuation of diltiazem especially in the setting of ejection fraction of  25% as this is a negative inotrope and can cause decreased contractility.  - She is already  taking amiodarone 200 mg twice a day chronically to help control her atrial fibrillation which on 7/31 ECG appeared well rate controlled. I do think that we will need to increase the amiodarone however to see if this will help. I will change to 200 mg 3 times a day.   - Agree with trial of low dose metoprolol, perhaps 12.5mg  BID.  - Challenging situation. She may be right that her recent surgery on lower extremity may be causing increased inflammatory state and therefore increased heart rate.   - Warfarin for anticoagulation.   Dilated  Cardiomyopathy  - Unable to utilize traditional beta blockers and ace inhibitors because of her end-stage renal disease and hypotension.  - She would not be a candidate for advanced therapies  End-stage renal disease  - Per nephrology.   Signed, Candee Furbish, MD  06/01/2017 4:40 PM

## 2017-06-02 ENCOUNTER — Inpatient Hospital Stay (HOSPITAL_COMMUNITY): Payer: Medicare Other | Admitting: Physical Therapy

## 2017-06-02 DIAGNOSIS — I482 Chronic atrial fibrillation: Secondary | ICD-10-CM

## 2017-06-02 DIAGNOSIS — I4891 Unspecified atrial fibrillation: Secondary | ICD-10-CM | POA: Diagnosis present

## 2017-06-02 LAB — CBC
HCT: 28.3 % — ABNORMAL LOW (ref 36.0–46.0)
Hemoglobin: 8.9 g/dL — ABNORMAL LOW (ref 12.0–15.0)
MCH: 33.2 pg (ref 26.0–34.0)
MCHC: 31.4 g/dL (ref 30.0–36.0)
MCV: 105.6 fL — AB (ref 78.0–100.0)
PLATELETS: 271 10*3/uL (ref 150–400)
RBC: 2.68 MIL/uL — ABNORMAL LOW (ref 3.87–5.11)
RDW: 24 % — AB (ref 11.5–15.5)
WBC: 9.9 10*3/uL (ref 4.0–10.5)

## 2017-06-02 LAB — RENAL FUNCTION PANEL
ALBUMIN: 2.8 g/dL — AB (ref 3.5–5.0)
Anion gap: 12 (ref 5–15)
BUN: 34 mg/dL — AB (ref 6–20)
CHLORIDE: 98 mmol/L — AB (ref 101–111)
CO2: 24 mmol/L (ref 22–32)
CREATININE: 3.31 mg/dL — AB (ref 0.44–1.00)
Calcium: 8.5 mg/dL — ABNORMAL LOW (ref 8.9–10.3)
GFR calc Af Amer: 16 mL/min — ABNORMAL LOW (ref >60)
GFR, EST NON AFRICAN AMERICAN: 14 mL/min — AB (ref >60)
Glucose, Bld: 87 mg/dL (ref 65–99)
PHOSPHORUS: 3.8 mg/dL (ref 2.5–4.6)
Potassium: 3.4 mmol/L — ABNORMAL LOW (ref 3.5–5.1)
Sodium: 134 mmol/L — ABNORMAL LOW (ref 135–145)

## 2017-06-02 LAB — GLUCOSE, CAPILLARY
GLUCOSE-CAPILLARY: 107 mg/dL — AB (ref 65–99)
Glucose-Capillary: 99 mg/dL (ref 65–99)
Glucose-Capillary: 80 mg/dL (ref 65–99)

## 2017-06-02 LAB — PROTIME-INR
INR: 2.42
Prothrombin Time: 26.8 seconds — ABNORMAL HIGH (ref 11.4–15.2)

## 2017-06-02 MED ORDER — DILTIAZEM HCL 100 MG IV SOLR
5.0000 mg/h | INTRAVENOUS | Status: DC
  Filled 2017-06-02: qty 100

## 2017-06-02 MED ORDER — ALTEPLASE 2 MG IJ SOLR: 2.0000 mg | Freq: Once | INTRAMUSCULAR | Status: DC | PRN

## 2017-06-02 MED ORDER — MIDODRINE HCL 5 MG PO TABS
ORAL_TABLET | ORAL | Status: AC
  Administered 2017-06-02: 10 mg via ORAL
  Filled 2017-06-02: qty 2

## 2017-06-02 MED ORDER — DILTIAZEM 12 MG/ML ORAL SUSPENSION
10.0000 mg | Freq: Once | ORAL | Status: DC
  Filled 2017-06-02: qty 3

## 2017-06-02 MED ORDER — HEPARIN SODIUM (PORCINE) 1000 UNIT/ML DIALYSIS
100.0000 [IU]/kg | INTRAMUSCULAR | Status: DC | PRN
  Filled 2017-06-02: qty 6

## 2017-06-02 MED ORDER — LIDOCAINE-PRILOCAINE 2.5-2.5 % EX CREA: 1.0000 "application " | TOPICAL_CREAM | CUTANEOUS | Status: DC | PRN

## 2017-06-02 MED ORDER — DILTIAZEM LOAD VIA INFUSION
15.0000 mg | Freq: Once | INTRAVENOUS | Status: DC
  Filled 2017-06-02: qty 15

## 2017-06-02 MED ORDER — PENTAFLUOROPROP-TETRAFLUOROETH EX AERO: 1.0000 "application " | INHALATION_SPRAY | CUTANEOUS | Status: DC | PRN

## 2017-06-02 MED ORDER — LIDOCAINE HCL (PF) 1 % IJ SOLN: 5.0000 mL | INTRAMUSCULAR | Status: DC | PRN

## 2017-06-02 MED ORDER — SODIUM CHLORIDE 0.9 % IV BOLUS (SEPSIS)
500.0000 mL | Freq: Once | INTRAVENOUS | Status: AC
  Administered 2017-06-02: 500 mL via INTRAVENOUS

## 2017-06-02 MED ORDER — LINACLOTIDE 145 MCG PO CAPS
145.0000 ug | ORAL_CAPSULE | Freq: Every day | ORAL | Status: DC
  Filled 2017-06-02: qty 1

## 2017-06-02 MED ORDER — HEPARIN SODIUM (PORCINE) 1000 UNIT/ML DIALYSIS: 1000.0000 [IU] | INTRAMUSCULAR | Status: DC | PRN

## 2017-06-02 MED ORDER — SODIUM CHLORIDE 0.9 % IV SOLN: 100.0000 mL | INTRAVENOUS | Status: DC | PRN

## 2017-06-02 MED ORDER — WARFARIN SODIUM 2.5 MG PO TABS
2.5000 mg | ORAL_TABLET | Freq: Once | ORAL | Status: AC
  Administered 2017-06-02: 2.5 mg via ORAL
  Filled 2017-06-02: qty 1

## 2017-06-02 MED ORDER — METOPROLOL TARTRATE 5 MG/5ML IV SOLN
5.0000 mg | Freq: Once | INTRAVENOUS | Status: AC
  Administered 2017-06-03: 5 mg via INTRAVENOUS
  Filled 2017-06-02: qty 5

## 2017-06-02 NOTE — Progress Notes (Signed)
Subjective: Interval History: has no complaint , HR better.  Objective: Vital signs in last 24 hours: Temp:  [98.2 F (36.8 C)-98.5 F (36.9 C)] 98.5 F (36.9 C) (08/25 0523) Pulse Rate:  [100-153] 100 (08/25 0523) Resp:  [17-18] 18 (08/25 0523) BP: (99-123)/(55-79) 99/55 (08/25 0523) SpO2:  [100 %] 100 % (08/25 0523) Weight:  [65 kg (143 lb 4.8 oz)] 65 kg (143 lb 4.8 oz) (08/25 0500) Weight change: 9.5 kg (20 lb 15.1 oz)  Intake/Output from previous day: 08/24 0701 - 08/25 0700 In: 688 [P.O.:688] Out: -  Intake/Output this shift: Total I/O In: 120 [P.O.:120] Out: -   General appearance: alert, cooperative, no distress and pale Resp: diminished breath sounds RLL and rales RLL Chest wall: RIJ cath Cardio: irregularly irregular rhythm, S1, S2 normal and systolic murmur: holosystolic 2/6, blowing at apex GI: liver down 5 cm, soft, pos bs Extremities: edema 1 +., dressing LL leg  Lab Results:  Recent Labs  05/31/17 2233  WBC 7.8  HGB 8.3*  HCT 26.3*  PLT 221   BMET:  Recent Labs  05/31/17 2233  NA 136  K 3.9  CL 98*  CO2 26  GLUCOSE 86  BUN 31*  CREATININE 3.32*  CALCIUM 8.7*   No results for input(s): PTH in the last 72 hours. Iron Studies: No results for input(s): IRON, TIBC, TRANSFERRIN, FERRITIN in the last 72 hours.  Studies/Results: No results found.  I have reviewed the patient's current medications.  Assessment/Plan: 1 ESRD vol xs. , R effusion but bp low.  2 Afib  Rate better low dose Beta blocker, ^ amio with Cards, follow bps 3 CM 4 DM controlled 5 PVD 6 Debill 7 Anemia esa P Hd, esa, lopressor, amio , perm access, wound care    LOS: 26 days   [name] L 06/02/2017,11:23 AM

## 2017-06-02 NOTE — Progress Notes (Addendum)
On call note:  Called earlier for HR>150 Pt was seen at 11 pm  The pt developed rapid HR at the HD session earlier today. She received a 500 cc bolus w/o improvement. No CP  EKG: A fib w/RVR  PE:  VS reviewed NAD Tachycardic with irreg irreg RR Lungs w/decreased BS at B bases Otherwise exam as per my earlier note from this morning   A/P:  1. A fib w/RVR. Will need to transfer to telemetry. Paged Hospitalist Service. I spoke with Dr Myna Hidalgo who kindly agreed to accept the pt. 2. ESRD on HD 3. Cardiomyopathy.

## 2017-06-02 NOTE — Progress Notes (Signed)
Physical Therapy Session Note  Patient Details  Name: [name] MRN: [id number] Date of Birth: August 27, 1951  Today's Date: 06/02/2017 PT Individual Time: 5520-8022 PT Individual Time Calculation (min): 41 min   Short Term Goals: Week 4:  PT Short Term Goal 1 (Week 4): STG=LTG  Skilled Therapeutic Interventions/Progress Updates:    no c/o pain.  Session focus on transfers with sliding board.    Pt completes supine>sit with mod assist for LLE and to initiate trunk elevation.  Attempted sit<>stand x2 with total assist pt unable to clear bottom from EOB.  Slide board transfer to w/c with mod assist.  PT instructed pt in car transfer with slide board at simulated sedan height with min assist for transfer and mod to lift LEs into/out of car.  Discussed that pt will be unable to get into her [employer] or her husband's truck due to not being able to perform a sit>stand even with assist.  Pt verbalized understanding and states she will discuss with her husband.  Pt requesting to practice w/c<>BSC transfer with sliding board.  Performs with overall min assist and mod verbal cues for sequencing and forward weight shift.  Discussed placing BSC next to the bed so she will be able to perform a significant lateral lean for removal of the sliding board.  Also discussed choosing clothing that will be easy to manage for toileting.  Pt verbalized understanding.  Returned to w/c and positioned upright with call bell in reach and needs met.   Therapy Documentation Precautions:  Precautions Precautions: Fall Restrictions Weight Bearing Restrictions: No   See Function Navigator for Current Functional Status.   Therapy/Group: Individual Therapy  Michel Santee 06/02/2017, 1:45 PM

## 2017-06-02 NOTE — Progress Notes (Signed)
Dialysis treatment completed.  1000 mL ultrafiltrated.  0 mL net fluid removal.  Patient status unchanged. Lung sounds diminished to ausculation in all fields. Generalized edema. Cardiac: AFIB, RVR.  Cleansed RIJ catheter with chlorhexidine.  Disconnected lines and flushed ports with saline per protocol.  Ports locked with heparin and capped per protocol.    Report given to bedside, RN Corazon.   PT HR post tx noted to be sustaining in 140's -150's.  Nephrology notified and 500 mL bolus administered, dropping UF to 0 mL.  Scheduled oral Amdiodarone administered.  Bedside RN Corazon notified.  Patient transferred with this RN on HD monitor to room.  RRT phoned per bedside RN.  Nephrology notified of patient non response to NS bolus.

## 2017-06-02 NOTE — Progress Notes (Signed)
ANTICOAGULATION CONSULT NOTE - Follow Up Consult  Pharmacy Consult for coumadin Indication: atrial fibrillation  No Known Allergies  Patient Measurements: Height: 5\' 5"  (165.1 cm) Weight: 143 lb 4.8 oz (65 kg) IBW/kg (Calculated) : 57 Heparin Dosing Weight:   Vital Signs: Temp: 98.5 F (36.9 C) (08/25 0523) Temp Source: Oral (08/25 0523) BP: 99/55 (08/25 0523) Pulse Rate: 100 (08/25 0523)  Labs:  Recent Labs  05/31/17 0605 05/31/17 2233 06/01/17 0601 06/02/17 0848  HGB  --  8.3*  --   --   HCT  --  26.3*  --   --   PLT  --  221  --   --   LABPROT 17.6*  --  18.2* 26.8*  INR 1.43  --  1.49 2.42  CREATININE  --  3.32*  --   --     Estimated Creatinine Clearance: 15.2 mL/min (A) (by C-G formula based on SCr of 3.32 mg/dL (H)).   Medications:  Scheduled:  . amiodarone  200 mg Oral TID  . darbepoetin (ARANESP) injection - DIALYSIS  150 mcg Intravenous Q Thu-HD  . famotidine  20 mg Oral QHS  . feeding supplement (PRO-STAT SUGAR FREE 64)  30 mL Oral BID  . hydrocerin   Topical BID  . levothyroxine  100 mcg Oral QAC breakfast  . megestrol  40 mg Oral BID  . metoprolol tartrate  12.5 mg Oral BID  . midodrine  10 mg Oral TID WC  . multivitamin  1 tablet Oral QHS  . QUEtiapine  25 mg Oral QHS  . senna-docusate  2 tablet Oral QHS  . sertraline  25 mg Oral QHS  . Warfarin - Pharmacist Dosing Inpatient   Does not apply q1800   Infusions:  . sodium chloride    . sodium chloride    . ferric gluconate (FERRLECIT/NULECIT) IV Stopped (06/01/17 2229)    Assessment: 66 yo female with afib was started on Coumadin on 8/22. Coumadin was initiated at 2.5 mg, then increased to 5 mg po. INR increased from 1.49 yesterday to 2.42 today. Patient is currently on amiodarone.   Last CBC of 8/23 was stable. No bleeding noted.   Goal of Therapy:  INR 2-3 Monitor platelets by anticoagulation protocol: Yes   Plan:  Decrease Coumadin to 2.5 mg po x 1 tonight INR in am  Leroy Libman, PharmD Pharmacy Resident Pager: [phone]  06/02/2017,9:38 AM

## 2017-06-02 NOTE — Progress Notes (Signed)
[name] is a 66 y.o. female 07-21-1951 [id number]  Subjective: C/o constipation. Slept well. Feeling OK.  Objective: Vital signs in last 24 hours: Temp:  [98.2 F (36.8 C)-98.5 F (36.9 C)] 98.5 F (36.9 C) (08/25 0523) Pulse Rate:  [100-153] 100 (08/25 0523) Resp:  [17-18] 18 (08/25 0523) BP: (99-123)/(55-79) 99/55 (08/25 0523) SpO2:  [100 %] 100 % (08/25 0523) Weight:  [143 lb 4.8 oz (65 kg)] 143 lb 4.8 oz (65 kg) (08/25 0500) Weight change: 20 lb 15.1 oz (9.5 kg) Last BM Date: 06/01/17  Intake/Output from previous day: 08/24 0701 - 08/25 0700 In: 688 [P.O.:688] Out: -  Last cbgs: CBG (last 3)   Recent Labs  06/01/17 2120 06/02/17 0644 06/02/17 1223  GLUCAP 103* 99 107*     Physical Exam General: No apparent distress   HEENT: not dry Lungs: Normal effort. Lungs clear to auscultation, no crackles or wheezes. Cardiovascular: Regular rate and rhythm, no edema Abdomen: S/NT/ND; BS(+) Musculoskeletal:  unchanged Neurological: No new neurological deficits Wounds: N/A    Skin: clear  Aging changes LLE wound is dressed Mental state: Alert, oriented, cooperative    Lab Results: BMET    Component Value Date/Time   NA 136 05/31/2017 2233   NA 140 09/19/2014 0621   K 3.9 05/31/2017 2233   K 4.1 09/19/2014 0621   CL 98 (L) 05/31/2017 2233   CL 109 (H) 09/19/2014 0621   CO2 26 05/31/2017 2233   CO2 23 09/19/2014 0621   GLUCOSE 86 05/31/2017 2233   GLUCOSE 99 09/19/2014 0621   BUN 31 (H) 05/31/2017 2233   BUN 10 09/19/2014 0621   CREATININE 3.32 (H) 05/31/2017 2233   CREATININE 0.62 09/19/2014 0621   CALCIUM 8.7 (L) 05/31/2017 2233   CALCIUM 8.7 05/29/2017 1616   GFRNONAA 14 (L) 05/31/2017 2233   GFRNONAA >60 09/19/2014 0621   GFRAA 16 (L) 05/31/2017 2233   GFRAA >60 09/19/2014 0621   CBC    Component Value Date/Time   WBC 7.8 05/31/2017 2233   RBC 2.47 (L) 05/31/2017 2233   HGB 8.3 (L) 05/31/2017 2233   HGB 14.4 09/19/2014 0621   HCT 26.3 (L)  05/31/2017 2233   HCT 43.3 09/19/2014 0621   PLT 221 05/31/2017 2233   PLT 225 09/19/2014 0621   MCV 106.5 (H) 05/31/2017 2233   MCV 98 09/19/2014 0621   MCH 33.6 05/31/2017 2233   MCHC 31.6 05/31/2017 2233   RDW 24.4 (H) 05/31/2017 2233   RDW 12.6 09/19/2014 0621   LYMPHSABS 1.3 05/21/2017 0917   LYMPHSABS 2.4 09/19/2014 0621   MONOABS 0.8 05/21/2017 0917   MONOABS 0.6 09/19/2014 0621   EOSABS 0.5 05/21/2017 0917   EOSABS 0.1 09/19/2014 0621   BASOSABS 0.0 05/21/2017 0917   BASOSABS 0.0 09/19/2014 0621    Studies/Results: No results found.  Medications: I have reviewed the patient's current medications.  Assessment/Plan:   1. CIM - CIR 2. DVT proph - Coumadin 3. LLE wound - vac 4. PAF. Coumadin, amiodarone 5. Anemia - monitor CBC 6. Anxiety - Seroquel, Zoloft 7. ESRD on HD 8. Vaginal bleeding - per GYN 9. Constipation - Linzess    Length of stay, days: [place] , MD 06/02/2017, 2:01 PM

## 2017-06-02 NOTE — Progress Notes (Signed)
Patient arrived to unit by bed.  Reviewed treatment plan and this RN agrees with plan.  Report received from bedside RN, Caryl Pina.  Consent verified.  Patient A & o X 4.   Lung sounds diminished to ausculation in all fields. Generalized edema. Cardiac:  Afib.  Removed caps and cleansed RIJ catheter with chlorhedxidine.  Aspirated ports of heparin and flushed them with saline per protocol.  Connected and secured lines, initiated treatment at 1709.  UF Goal of 1500 mL and net fluid removal 1 L.  Will continue to monitor.

## 2017-06-03 ENCOUNTER — Inpatient Hospital Stay (HOSPITAL_COMMUNITY): Payer: Medicare Other

## 2017-06-03 ENCOUNTER — Encounter (HOSPITAL_COMMUNITY): Payer: Self-pay

## 2017-06-03 ENCOUNTER — Observation Stay (HOSPITAL_COMMUNITY)
Admission: RE | Admit: 2017-06-03 | Discharge: 2017-06-06 | Disposition: A | Discharge status: Home / Self Care | Payer: Medicare Other | Source: Ambulatory Visit | Attending: Internal Medicine | Admitting: Internal Medicine

## 2017-06-03 DIAGNOSIS — N186 End stage renal disease: Secondary | ICD-10-CM | POA: Diagnosis not present

## 2017-06-03 DIAGNOSIS — I4891 Unspecified atrial fibrillation: Secondary | ICD-10-CM | POA: Diagnosis not present

## 2017-06-03 DIAGNOSIS — L89613 Pressure ulcer of right heel, stage 3: Secondary | ICD-10-CM | POA: Insufficient documentation

## 2017-06-03 DIAGNOSIS — E039 Hypothyroidism, unspecified: Secondary | ICD-10-CM | POA: Insufficient documentation

## 2017-06-03 DIAGNOSIS — E440 Moderate protein-calorie malnutrition: Secondary | ICD-10-CM | POA: Insufficient documentation

## 2017-06-03 DIAGNOSIS — L8990 Pressure ulcer of unspecified site, unspecified stage: Secondary | ICD-10-CM | POA: Insufficient documentation

## 2017-06-03 DIAGNOSIS — Z79899 Other long term (current) drug therapy: Secondary | ICD-10-CM | POA: Insufficient documentation

## 2017-06-03 DIAGNOSIS — I5022 Chronic systolic (congestive) heart failure: Secondary | ICD-10-CM | POA: Diagnosis present

## 2017-06-03 DIAGNOSIS — I959 Hypotension, unspecified: Secondary | ICD-10-CM | POA: Insufficient documentation

## 2017-06-03 DIAGNOSIS — Z7901 Long term (current) use of anticoagulants: Secondary | ICD-10-CM | POA: Insufficient documentation

## 2017-06-03 DIAGNOSIS — L89313 Pressure ulcer of right buttock, stage 3: Secondary | ICD-10-CM | POA: Insufficient documentation

## 2017-06-03 DIAGNOSIS — G7281 Critical illness myopathy: Secondary | ICD-10-CM | POA: Diagnosis present

## 2017-06-03 DIAGNOSIS — I132 Hypertensive heart and chronic kidney disease with heart failure and with stage 5 chronic kidney disease, or end stage renal disease: Secondary | ICD-10-CM | POA: Insufficient documentation

## 2017-06-03 DIAGNOSIS — Z8674 Personal history of sudden cardiac arrest: Secondary | ICD-10-CM | POA: Insufficient documentation

## 2017-06-03 DIAGNOSIS — Z992 Dependence on renal dialysis: Secondary | ICD-10-CM | POA: Insufficient documentation

## 2017-06-03 DIAGNOSIS — D631 Anemia in chronic kidney disease: Secondary | ICD-10-CM | POA: Insufficient documentation

## 2017-06-03 LAB — TROPONIN I
TROPONIN I: 0.04 ng/mL — AB (ref <0.03)
TROPONIN I: 0.04 ng/mL — AB (ref <0.03)

## 2017-06-03 LAB — TSH: TSH: 2.01 u[IU]/mL (ref 0.350–4.500)

## 2017-06-03 LAB — BASIC METABOLIC PANEL
Anion gap: 10 (ref 5–15)
BUN: 8 mg/dL (ref 6–20)
CHLORIDE: 101 mmol/L (ref 101–111)
CO2: 25 mmol/L (ref 22–32)
Calcium: 8.1 mg/dL — ABNORMAL LOW (ref 8.9–10.3)
Creatinine, Ser: 1.37 mg/dL — ABNORMAL HIGH (ref 0.44–1.00)
GFR calc Af Amer: 46 mL/min — ABNORMAL LOW (ref >60)
GFR calc non Af Amer: 40 mL/min — ABNORMAL LOW (ref >60)
GLUCOSE: 114 mg/dL — AB (ref 65–99)
POTASSIUM: 3.7 mmol/L (ref 3.5–5.1)
SODIUM: 136 mmol/L (ref 135–145)

## 2017-06-03 LAB — MAGNESIUM: MAGNESIUM: 1.8 mg/dL (ref 1.7–2.4)

## 2017-06-03 LAB — PROTIME-INR
INR: 2.34
PROTHROMBIN TIME: 26.1 s — AB (ref 11.4–15.2)

## 2017-06-03 MED ORDER — MIDODRINE HCL 5 MG PO TABS: 10.0000 mg | ORAL_TABLET | Freq: Three times a day (TID) | ORAL | Status: DC

## 2017-06-03 MED ORDER — ACETAMINOPHEN 325 MG PO TABS: 650.0000 mg | ORAL_TABLET | ORAL | Status: DC | PRN

## 2017-06-03 MED ORDER — METOPROLOL TARTRATE 12.5 MG HALF TABLET: 12.5000 mg | ORAL_TABLET | Freq: Once | ORAL | Status: DC

## 2017-06-03 MED ORDER — ALPRAZOLAM 0.25 MG PO TABS
0.2500 mg | ORAL_TABLET | Freq: Three times a day (TID) | ORAL | Status: DC | PRN
  Administered 2017-06-04: 0.25 mg via ORAL
  Filled 2017-06-03: qty 1

## 2017-06-03 MED ORDER — BISACODYL 10 MG RE SUPP: 10.0000 mg | Freq: Every day | RECTAL | Status: DC | PRN

## 2017-06-03 MED ORDER — SODIUM CHLORIDE 0.9 % IV BOLUS (SEPSIS)
250.0000 mL | Freq: Once | INTRAVENOUS | Status: AC
  Administered 2017-06-03: 250 mL via INTRAVENOUS

## 2017-06-03 MED ORDER — GERHARDT'S BUTT CREAM
TOPICAL_CREAM | Freq: Three times a day (TID) | CUTANEOUS | Status: DC
  Administered 2017-06-03: 1 via TOPICAL
  Administered 2017-06-03: 14:00:00 via TOPICAL
  Administered 2017-06-04: 1 via TOPICAL
  Administered 2017-06-04: 22:00:00 via TOPICAL
  Administered 2017-06-04: 1 via TOPICAL
  Administered 2017-06-05 – 2017-06-06 (×4): via TOPICAL
  Filled 2017-06-03: qty 1

## 2017-06-03 MED ORDER — RENA-VITE PO TABS
1.0000 | ORAL_TABLET | Freq: Every day | ORAL | Status: DC
  Administered 2017-06-03 – 2017-06-05 (×2): 1 via ORAL
  Filled 2017-06-03 (×2): qty 1

## 2017-06-03 MED ORDER — PRO-STAT SUGAR FREE PO LIQD: 30.0000 mL | Freq: Two times a day (BID) | ORAL | Status: DC

## 2017-06-03 MED ORDER — MEGESTROL ACETATE 40 MG PO TABS
40.0000 mg | ORAL_TABLET | Freq: Two times a day (BID) | ORAL | Status: DC
  Administered 2017-06-03 – 2017-06-06 (×6): 40 mg via ORAL
  Filled 2017-06-03 (×8): qty 1

## 2017-06-03 MED ORDER — SENNOSIDES-DOCUSATE SODIUM 8.6-50 MG PO TABS
2.0000 | ORAL_TABLET | Freq: Every day | ORAL | Status: DC
  Administered 2017-06-03 – 2017-06-05 (×3): 2 via ORAL
  Filled 2017-06-03 (×3): qty 2

## 2017-06-03 MED ORDER — ONDANSETRON HCL 4 MG/2ML IJ SOLN: 4.0000 mg | Freq: Four times a day (QID) | INTRAMUSCULAR | Status: DC | PRN

## 2017-06-03 MED ORDER — METOPROLOL TARTRATE 12.5 MG HALF TABLET
12.5000 mg | ORAL_TABLET | Freq: Four times a day (QID) | ORAL | Status: DC
  Administered 2017-06-03 – 2017-06-04 (×4): 12.5 mg via ORAL
  Filled 2017-06-03 (×4): qty 1

## 2017-06-03 MED ORDER — SERTRALINE HCL 50 MG PO TABS
25.0000 mg | ORAL_TABLET | Freq: Every day | ORAL | Status: DC
  Administered 2017-06-03 – 2017-06-05 (×3): 25 mg via ORAL
  Filled 2017-06-03: qty 0.5
  Filled 2017-06-03 (×2): qty 1

## 2017-06-03 MED ORDER — QUETIAPINE FUMARATE 25 MG PO TABS
25.0000 mg | ORAL_TABLET | Freq: Every day | ORAL | Status: DC
  Administered 2017-06-03 – 2017-06-05 (×3): 25 mg via ORAL
  Filled 2017-06-03 (×3): qty 1

## 2017-06-03 MED ORDER — METOPROLOL TARTRATE 12.5 MG HALF TABLET: 12.5000 mg | ORAL_TABLET | Freq: Two times a day (BID) | ORAL | Status: DC

## 2017-06-03 MED ORDER — POLYETHYLENE GLYCOL 3350 17 G PO PACK
17.0000 g | PACK | Freq: Every day | ORAL | Status: DC | PRN
  Administered 2017-06-03: 17 g via ORAL
  Filled 2017-06-03: qty 1

## 2017-06-03 MED ORDER — PRO-STAT SUGAR FREE PO LIQD
30.0000 mL | Freq: Two times a day (BID) | ORAL | Status: DC
  Filled 2017-06-03 (×4): qty 30

## 2017-06-03 MED ORDER — AMIODARONE HCL 200 MG PO TABS
400.0000 mg | ORAL_TABLET | Freq: Two times a day (BID) | ORAL | Status: DC
  Administered 2017-06-03 – 2017-06-06 (×6): 400 mg via ORAL
  Filled 2017-06-03 (×6): qty 2

## 2017-06-03 MED ORDER — WARFARIN SODIUM 2.5 MG PO TABS
2.5000 mg | ORAL_TABLET | Freq: Once | ORAL | Status: AC
  Administered 2017-06-03: 2.5 mg via ORAL
  Filled 2017-06-03: qty 1

## 2017-06-03 MED ORDER — AMIODARONE HCL 200 MG PO TABS
200.0000 mg | ORAL_TABLET | Freq: Three times a day (TID) | ORAL | Status: DC
  Administered 2017-06-03: 200 mg via ORAL
  Filled 2017-06-03: qty 1

## 2017-06-03 MED ORDER — METOPROLOL TARTRATE 25 MG PO TABS: 25.0000 mg | ORAL_TABLET | Freq: Two times a day (BID) | ORAL | Status: DC

## 2017-06-03 MED ORDER — ACETAMINOPHEN 325 MG PO TABS: 650.0000 mg | ORAL_TABLET | Freq: Four times a day (QID) | ORAL | Status: DC | PRN

## 2017-06-03 MED ORDER — MIDODRINE HCL 5 MG PO TABS
10.0000 mg | ORAL_TABLET | Freq: Three times a day (TID) | ORAL | Status: DC
  Administered 2017-06-03 – 2017-06-06 (×11): 10 mg via ORAL
  Filled 2017-06-03 (×9): qty 2

## 2017-06-03 MED ORDER — LEVOTHYROXINE SODIUM 100 MCG PO TABS
100.0000 ug | ORAL_TABLET | Freq: Every day | ORAL | Status: DC
  Administered 2017-06-03: 100 ug via ORAL
  Filled 2017-06-03: qty 1

## 2017-06-03 MED ORDER — HYDROCERIN EX CREA
TOPICAL_CREAM | Freq: Two times a day (BID) | CUTANEOUS | Status: DC
  Administered 2017-06-03 – 2017-06-06 (×7): via TOPICAL
  Filled 2017-06-03: qty 113

## 2017-06-03 MED ORDER — AMIODARONE HCL 200 MG PO TABS: 200.0000 mg | ORAL_TABLET | Freq: Every day | ORAL | Status: DC

## 2017-06-03 MED ORDER — WARFARIN - PHARMACIST DOSING INPATIENT: Freq: Every day | Status: DC

## 2017-06-03 MED ORDER — AMIODARONE HCL 200 MG PO TABS: 200.0000 mg | ORAL_TABLET | Freq: Three times a day (TID) | ORAL | Status: DC

## 2017-06-03 MED ORDER — AMIODARONE HCL 200 MG PO TABS
200.0000 mg | ORAL_TABLET | Freq: Once | ORAL | Status: AC
  Administered 2017-06-03: 200 mg via ORAL
  Filled 2017-06-03: qty 1

## 2017-06-03 MED ORDER — FAMOTIDINE 20 MG PO TABS
20.0000 mg | ORAL_TABLET | Freq: Every day | ORAL | Status: DC
  Administered 2017-06-03 – 2017-06-05 (×3): 20 mg via ORAL
  Filled 2017-06-03 (×3): qty 1

## 2017-06-03 MED ORDER — MEGESTROL ACETATE 40 MG PO TABS
40.0000 mg | ORAL_TABLET | Freq: Two times a day (BID) | ORAL | Status: DC
  Filled 2017-06-03: qty 1

## 2017-06-03 MED ORDER — POLYETHYLENE GLYCOL 3350 17 G PO PACK: 17.0000 g | PACK | Freq: Every day | ORAL | Status: DC | PRN

## 2017-06-03 MED ORDER — IPRATROPIUM-ALBUTEROL 0.5-2.5 (3) MG/3ML IN SOLN: 3.0000 mL | RESPIRATORY_TRACT | Status: DC | PRN

## 2017-06-03 MED ORDER — LINACLOTIDE 145 MCG PO CAPS
145.0000 ug | ORAL_CAPSULE | Freq: Every day | ORAL | Status: DC
  Administered 2017-06-03: 145 ug via ORAL
  Filled 2017-06-03: qty 1

## 2017-06-03 MED ORDER — METOPROLOL TARTRATE 12.5 MG HALF TABLET
12.5000 mg | ORAL_TABLET | Freq: Four times a day (QID) | ORAL | Status: DC
  Administered 2017-06-03: 12.5 mg via ORAL
  Filled 2017-06-03: qty 1

## 2017-06-03 NOTE — Progress Notes (Signed)
Progress Note    [name]  KDX:[id number] DOB: Oct 07, 1951  DOA: 06/03/2017 PCP: Tracie Harrier, MD    Brief Narrative:   Chief complaint: Follow-up A. fib with RVR  Medical records reviewed and are as summarized below:  [name] is an 66 y.o. female PMH of recent prolonged hospitalization in July for treatment of cellulitis/sepsis complicated by PEA cardiac arrest, arms, kidney failure requiring no onset dialysis and upper extremity DVTs. She also has had new onset atrial fibrillation and 2-D echo showed an EF of 25-30%. Readmitted 06/02/17 after she developed A. fib with RVR with heart rate into the 160s during HD.  Assessment/Plan:   Principal Problem:   Atrial fibrillation with RVR (HCC)/Demand ischemia Given IV Lopressor and by mouth dose increased. Continue amiodarone. Heart rate 120s this morning. Troponin 0.042. Trend flat. Likely demand ischemia.Cardiology following and adjusting rate control medications. Anticoagulation with Coumadin. INR therapeutic.  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    Critical illness myopathy PT/OT.    Hypotension Continue midodrine.    ESRD (end stage renal disease) ([place]) HD per nephrology.    Chronic systolic CHF (congestive heart failure) (HCC)/dilated cardiomyopathy 2-D echo 04/08/17 showed EF 25-30 percent. Cardiology notes that she is not a candidate for advanced therapy.  Body mass index is 26.05 kg/m.   Family Communication/Anticipated D/C date and plan/Code Status   DVT prophylaxis: Coumadin ordered. Code Status: Full Code.  Family Communication: No family present at the bedside. Disposition Plan: SNF if rate remains controlled over the next 24 hours.   Medical Consultants:    Cardiology   Anti-Infectives:    None  Subjective:   Has some anxiety at times. No chest pain or dyspnea.   Objective:    Vitals:   06/03/17 0423 06/03/17 0748 06/03/17 0751 06/03/17 0753  BP: 101/68 (!) 102/59    Pulse: (!) 115 (!) 119    Resp: 20 (!) 22 19 18   Temp: 97.7 F (36.5 C) 98.2 F (36.8 C)    TempSrc: Oral Oral    SpO2: 96% 97%    Weight: 71 kg (156 lb 8.4 oz)     Height: 5\' 5"  (1.651 m)       Intake/Output Summary (Last 24 hours) at 06/03/17 0851 Last data filed at 06/03/17 0419  Gross per 24 hour  Intake                0 ml  Output               50 ml  Net              -50 ml   Filed Weights   06/03/17 0423  Weight: 71 kg (156 lb 8.4 oz)    Exam: General: Elderly female, chronically ill appearing. No acute distress. Cardiovascular: Heart sounds are irregularly irregular and mildly tachycardic. No gallops or rubs. No murmurs. No JVD. Lungs: Clear to auscultation bilaterally with good air movement. No rales, rhonchi or wheezes. Abdomen: Soft, nontender, nondistended with normal active bowel sounds. No masses. No hepatosplenomegaly. Neurological: Alert and oriented 3. Moves all extremities 4 with equal strength. Cranial nerves II through XII grossly intact. Skin: Warm and dry. No rashes or lesions. Extremities: No clubbing or cyanosis. No edema. Pedal pulses 2+. Psychiatric: Mood and affect are normal. Insight and judgment are good.   Data Reviewed:   I have personally reviewed  following labs and imaging studies:  Labs: Labs show the following: Sodium 136, potassium 3.7, chloride 101, bicarbonate 25, BUN 8, creatinine 1.37, glucose 114. WBC 9.9, hemoglobin 8.9, hematocrit 28.3, platelets 271.  INR 2.34.  Troponin 0.042.  TSH 2.010.  Microbiology 05/27/17: MRSA PCR negative.  Procedures and diagnostic studies:  No results found.  Medications:   . amiodarone  200 mg Oral TID  . famotidine  20 mg Oral QHS  . feeding supplement (PRO-STAT SUGAR FREE 64)  30 mL Oral BID  . levothyroxine  100 mcg Oral QAC breakfast  . linaclotide  145 mcg Oral QAC breakfast  . megestrol  40 mg  Oral BID  . metoprolol tartrate  12.5 mg Oral Q6H  . midodrine  10 mg Oral TID WC  . multivitamin  1 tablet Oral QHS  . QUEtiapine  25 mg Oral QHS  . senna-docusate  2 tablet Oral QHS  . sertraline  25 mg Oral QHS  . Warfarin - Pharmacist Dosing Inpatient   Does not apply q1800   Continuous Infusions:   LOS: 0 days   [name]  [facility] Pager [phone]. If unable to reach me by pager, please call my cell phone at [phone].  *Please refer to [email or web address], password [email or web address] to get updated schedule on who will round on this patient, as hospitalists switch teams weekly. If 7PM-7AM, please contact night-coverage at [email or web address], password [email or web address] for any overnight needs.  06/03/2017, 8:51 AM

## 2017-06-03 NOTE — Progress Notes (Signed)
Subjective: Interval History: has no complaint, no real sx with ^ HR.  Objective: Vital signs in last 24 hours: Temp:  [97.7 F (36.5 C)-98.4 F (36.9 C)] 98.2 F (36.8 C) (08/26 0748) Pulse Rate:  [73-163] 119 (08/26 0748) Resp:  [16-22] 18 (08/26 0753) BP: (85-126)/(53-97) 102/59 (08/26 0748) SpO2:  [96 %-100 %] 97 % (08/26 0748) Weight:  [64.5 kg (142 lb 3.2 oz)-71 kg (156 lb 8.4 oz)] 71 kg (156 lb 8.4 oz) (08/26 0423) Weight change:   Intake/Output from previous day: 08/25 0701 - 08/26 0700 In: 0  Out: 50 [Drains:50] Intake/Output this shift: No intake/output data recorded.  General appearance: alert, cooperative, no distress, mildly obese and pale Resp: dullness to percussion RLL and rales RLL Chest wall: RIJ cath Cardio: irregularly irregular rhythm, systolic murmur: systolic ejection 2/6, decrescendo at 2nd left intercostal space and rate 120s GI: liver down 5 cm , pos bs, soft 1+remities: edema dressing L calf and elevated  Lab Results:  Recent Labs  05/31/17 2233 06/02/17 1730  WBC 7.8 9.9  HGB 8.3* 8.9*  HCT 26.3* 28.3*  PLT 221 271   BMET:  Recent Labs  06/02/17 1729 06/03/17 0509  NA 134* 136  K 3.4* 3.7  CL 98* 101  CO2 24 25  GLUCOSE 87 114*  BUN 34* 8  CREATININE 3.31* 1.37*  CALCIUM 8.5* 8.1*   No results for input(s): PTH in the last 72 hours. Iron Studies: No results for input(s): IRON, TIBC, TRANSFERRIN, FERRITIN in the last 72 hours.  Studies/Results: No results found.  I have reviewed the patient's current medications.  Assessment/Plan: 1 ESRD HD TTS needs perm access 2 Afib on lopressor and amio, hopefully amio will kick in and help 3 Anemia esa/Fe 4 PVD 5 DM controlled 6 Leg ulcer P HD TTS, lopressor, amio, esa    LOS: 0 days   Izrael Peak L 06/03/2017,8:31 AM

## 2017-06-03 NOTE — H&P (Signed)
History and Physical    [name] SHF:[id number] DOB: 1951/02/11 DOA: (Not on file)  PCP: Tracie Harrier, MD  Patient coming from: [place] rehab  I have personally briefly reviewed patient's old medical records in [place]  Chief Complaint: A.Fib RVR  HPI: [name] is a 66 y.o. female with medical history significant of admission in July to [facility] for 25 days.  She initially went in with BLE cellulitis, which turned into sepsis.  That admission also featured a PEA cardiac arrest, ARDS, kidney failure requiring new onset dialysis, BUE DVTs.  EF 25-30% with A.Fib since discharge to rehab.  She was seen by Dr. Marlou Porch 2 days ago after having HR up to 150s after dialysis.  She had been on cardizem and amio up until the 24th, but this was changed to Metoprolol 12.5mg  BID and amiodarine 200 TID due to cardizem contributing to hypotension.  Tonight in dialysis her A.Fib went up to 160s.  They kept her fluids neutral and gave back the 500cc that they had taken off.  Despite this HR remained in the 160s-170s.  Hospitalist was called to see patient.   ED Course: Despite significant RVR, patient is asymptomatic at this time she states.  No CP, no SOB.   Review of Systems: As per HPI otherwise 10 point review of systems negative.   Past Medical History:  Diagnosis Date  . A-fib ([place])    on pradaxa  . Chronic kidney disease   . Dysrhythmia   . Hypertension   . Hypothyroidism   . Thyroid disease     Past Surgical History:  Procedure Laterality Date  . APPLICATION OF A-CELL OF EXTREMITY Left 05/28/2017   Procedure: APPLICATION OF A-CELL OF LEFT LOWER EXTREMITY;  Surgeon: Wallace Going, DO;  Location: [place];  Service: Plastics;  Laterality: Left;  . APPLICATION OF WOUND VAC Left 05/28/2017   Procedure: APPLICATION OF WOUND VAC;  Surgeon: Wallace Going, DO;  Location: [place];  Service: Plastics;  Laterality: Left;  . DIALYSIS/PERMA CATHETER INSERTION N/A 04/26/2017   Procedure: Dialysis/Perma Catheter Insertion;  Surgeon: Algernon Huxley, MD;  Location: [facility];  Service: Cardiovascular;  Laterality: N/A;  . INCISION AND DRAINAGE OF WOUND Left 05/28/2017   Procedure: IRRIGATION AND DEBRIDEMENT LEFT LOWER LEG WOUND;  Surgeon: Wallace Going, DO;  Location: [place];  Service: Plastics;  Laterality: Left;  . PR DEBRIDEMENT, SKIN, SUB-Q TISSUE,=<20 SQ CM  05/21/2017      . PR DEBRIDEMENT, SKIN, SUB-Q TISSUE,EACH ADD 20 SQ CM  05/21/2017      . WRIST ARTHROSCOPY       reports that she has never smoked. She has never used smokeless tobacco. She reports that she does not drink alcohol or use drugs.  No Known Allergies  Family History  Problem Relation Age of Onset  . AAA (abdominal aortic aneurysm) Mother      Prior to Admission medications   Medication Sig Start Date End Date Taking? Authorizing Provider  ALPRAZolam Duanne Moron) 0.25 MG tablet Take 0.25 mg by mouth at bedtime as needed for anxiety.    [provider]  amiodarone (PACERONE) 200 MG tablet Take 1 tablet (200 mg total) by mouth 2 (two) times daily. 05/07/17   Hillary Bow, MD  apixaban (ELIQUIS) 2.5 MG TABS tablet Take 1 tablet (2.5 mg total) by mouth 2 (two) times daily. 05/07/17   Hillary Bow, MD  diltiazem (TIAZAC) 180 MG 24 hr capsule Take 1 capsule (  180 mg total) by mouth daily. 05/07/17   Hillary Bow, MD  famotidine (PEPCID) 20 MG tablet Take 1 tablet (20 mg total) by mouth at bedtime. 05/07/17   Hillary Bow, MD  levothyroxine (SYNTHROID, LEVOTHROID) 50 MCG tablet Take 50 mcg by mouth daily before breakfast.    [provider]  midodrine (PROAMATINE) 5 MG tablet Take 1 tablet (5 mg total) by mouth 3 (three) times daily with meals. 05/07/17   Hillary Bow, MD  sertraline (ZOLOFT) 25 MG tablet Take 25 mg by mouth daily.    [provider]    Physical Exam: There were no vitals filed for this visit.  Constitutional: NAD, calm,  comfortable Eyes: PERRL, lids and conjunctivae normal ENMT: Mucous membranes are moist. Posterior pharynx clear of any exudate or lesions.Normal dentition.  Neck: normal, supple, no masses, no thyromegaly Respiratory: clear to auscultation bilaterally, no wheezing, no crackles. Normal respiratory effort. No accessory muscle use.  Cardiovascular: Regular rate and rhythm, no murmurs / rubs / gallops. No extremity edema. 2+ pedal pulses. No carotid bruits.  Abdomen: no tenderness, no masses palpated. No hepatosplenomegaly. Bowel sounds positive.  Musculoskeletal: no clubbing / cyanosis. No joint deformity upper and lower extremities. Good ROM, no contractures. Normal muscle tone.  Skin: no rashes, lesions, ulcers. No induration Neurologic: CN 2-12 grossly intact. Sensation intact, DTR normal. Strength 5/5 in all 4.  Psychiatric: Normal judgment and insight. Alert and oriented x 3. Normal mood.    Labs on Admission: I have personally reviewed following labs and imaging studies  CBC:  Recent Labs Lab 05/28/17 0459 05/28/17 0851 05/29/17 0459 05/29/17 1616 05/31/17 2233 06/02/17 1730  WBC 10.2  --  9.4 8.6 7.8 9.9  HGB 8.4* 9.2* 8.2* 8.4* 8.3* 8.9*  HCT 27.3* 27.0* 26.8* 27.2* 26.3* 28.3*  MCV 105.0*  --  106.3* 106.3* 106.5* 105.6*  PLT 190  --  192 205 221 132   Basic Metabolic Panel:  Recent Labs Lab 05/28/17 0851 05/29/17 1614 05/29/17 1616 05/31/17 2233 06/02/17 1729  NA 136 136  --  136 134*  K 4.0 4.6  --  3.9 3.4*  CL  --  100*  --  98* 98*  CO2  --  23  --  26 24  GLUCOSE 81 123*  --  86 87  BUN  --  46*  --  31* 34*  CREATININE  --  4.31*  --  3.32* 3.31*  CALCIUM  --  8.9 8.7 8.7* 8.5*  PHOS  --  3.9  --  3.9 3.8   GFR: CrCl cannot be calculated (Unknown ideal weight.). Liver Function Tests:  Recent Labs Lab 05/29/17 1614 05/31/17 2233 06/02/17 1729  ALBUMIN 3.0* 2.9* 2.8*   No results for input(s): LIPASE, AMYLASE in the last 168 hours. No results  for input(s): AMMONIA in the last 168 hours. Coagulation Profile:  Recent Labs Lab 05/30/17 1503 05/31/17 0605 06/01/17 0601 06/02/17 0848  INR 1.49 1.43 1.49 2.42   Cardiac Enzymes: No results for input(s): CKTOTAL, CKMB, CKMBINDEX, TROPONINI in the last 168 hours. BNP (last 3 results) No results for input(s): PROBNP in the last 8760 hours. HbA1C: No results for input(s): HGBA1C in the last 72 hours. CBG:  Recent Labs Lab 06/01/17 1635 06/01/17 2120 06/02/17 0644 06/02/17 1223 06/02/17 2220  GLUCAP 105* 103* 99 107* 80   Lipid Profile: No results for input(s): CHOL, HDL, LDLCALC, TRIG, CHOLHDL, LDLDIRECT in the last 72 hours. Thyroid Function Tests: No results for  input(s): TSH, T4TOTAL, FREET4, T3FREE, THYROIDAB in the last 72 hours. Anemia Panel: No results for input(s): VITAMINB12, FOLATE, FERRITIN, TIBC, IRON, RETICCTPCT in the last 72 hours. Urine analysis:    Component Value Date/Time   COLORURINE AMBER (A) 05/16/2017 0200   APPEARANCEUR CLOUDY (A) 05/16/2017 0200   APPEARANCEUR Clear 09/18/2014 1257   LABSPEC 1.012 05/16/2017 0200   LABSPEC 1.005 09/18/2014 1257   PHURINE 7.0 05/16/2017 0200   GLUCOSEU NEGATIVE 05/16/2017 0200   GLUCOSEU Negative 09/18/2014 1257   HGBUR SMALL (A) 05/16/2017 0200   BILIRUBINUR NEGATIVE 05/16/2017 0200   BILIRUBINUR Negative 09/18/2014 1257   KETONESUR NEGATIVE 05/16/2017 0200   PROTEINUR 100 (A) 05/16/2017 0200   NITRITE NEGATIVE 05/16/2017 0200   LEUKOCYTESUR LARGE (A) 05/16/2017 0200   LEUKOCYTESUR Negative 09/18/2014 1257    Radiological Exams on Admission: No results found.  EKG: Independently reviewed.  Assessment/Plan Principal Problem:   Atrial fibrillation with RVR (HCC) Active Problems:   Critical illness myopathy   Hypotension   ESRD (end stage renal disease) (HCC)   Chronic systolic CHF (congestive heart failure) ([place])    1. A.Fib RVR - 1. Systolic's in the 638G.  Spoke with cards  fellow: 1. Lopressor 5mg  IV now 2. Try to increase PO lopressor dose if possible, can do up to Q6H 3. Consider IVF 4. "Dont check troponin unless she is symptomatic" 2. Will give another 250 cc bolus 3. Will increase Lopressor to 12.5mg  PO Q6H 4. Continue amiodarone 5. Tele monitor 6. Cards to continue to follow in inpatient side 2. ESRD - 1. Left message with nephrology consult line letting them know patient being switched to inpatient and to look for her so she doesn't fall off their list. 3. Chronic systolic CHF - 1. Watch for fluid overload with the NS bolus 4. Hypotension - Continue midodrine 5. Hypothyroidism - continue synthroid 6. Critical illness myopathy - resume PT/OT as soon as HR controlled.  DVT prophylaxis: Coumadin Code Status: Full Family Communication: No family in room Disposition Plan: Likely back to rehab after RVR is rate controlled better Consults called: Cardiology, spoke with Dr. Alveta Heimlich Admission status: Admit to inpatient   [place], [facility] Pager [phone]  If 7AM-7PM, please contact day team taking care of patient [email or web address] Password [email or web address]  06/03/2017, 1:10 AM

## 2017-06-03 NOTE — Progress Notes (Signed)
Called by pt's RN reference tachycardia post dialysis.  Pt in Afib RVR 130-160s.  BP stable, 113/97.  Pt is asymptomatic, in no acute distress on exam.  Respiratory rate controlled, even and unlabored with clear lung sounds bilaterally.  RN reports pt's MD coming to bedside, potential plan to transfer pt to telemetry or SDU for management of tachycardia.    Update:  Pt transferred to 3w09 for further management.  Now on [facility] service.

## 2017-06-03 NOTE — Progress Notes (Signed)
Report given to 3W receiving RN. Pt. Discharged to 3W. Troponin result relayed to Dr. Alcario Drought. No orders received.

## 2017-06-03 NOTE — Consult Note (Signed)
[place] Nurse wound consult note Reason for Consult:  Pressure injury (new onset), chronic wounds on right foot and surgical wound on left foot Wound type:Pressure Pressure Injury POA: No Measurement: Area of maroon/purple deep tissue pressure injury noted on coccyx and both side of the gluteal cleft measuring 7cm x 5cm. Circular area of full thickness tissue loss noted at 5 o'clock measuring 1cm round x 0.2cm with red, moist wound bed. Several small areas of discoloration on the right foot; these are improving with application of ?cream according to patient and husband.  Wound bed:As noted above Drainage (amount, consistency, odor) Scant serous from Stage 3 pressure injury at right buttock; non from other wounds Periwound: intact, dry.  Dressing procedure/placement/frequency: Patient is on a mattress replacement with low air loss feature and she and her husband are taught today about the importance of turning and repositioning from side to side and avoiding the supine position.  I have provided a pressure redistribution chair pad for use in chair.  We will apply a topical prescriptive (Gerhart's Butt cream) which is a compounded product containing antifungal, hydrocortisone and zinc oxide three times daily to the buttock and coccyx areas.  This is actually three times daily and as needed after fecal episodes which are currently liquid, but over which she has some control. The right foot has several resolving areas of discoloration, we will treat twice daily with Eucerin cream and place foot into a pressure redistribution heel boot.  The left foot is with an operatively applied NPWT device (Dr. [name], Plastics/S. [facility], [employer], PA) on Monday, August 20.  No orders for changing or removing, so bedside RN will contact them in the morning for direction.  As this is a first surgical dressing change, one of them will be present/perform the dressing removal/change for assessment. [place] nursing team will not  follow routinely, but will remain available to this patient, the nursing and medical teams.  Please re-consult if needed. Thanks, Maudie Flakes, MSN, RN, [place], Ar[facility]  Pager# [phone]

## 2017-06-03 NOTE — Progress Notes (Signed)
ANTICOAGULATION CONSULT NOTE - Follow Up Consult  Pharmacy Consult for coumadin Indication: atrial fibrillation  No Known Allergies  Patient Measurements: Height: 5\' 5"  (165.1 cm) Weight: 156 lb 8.4 oz (71 kg) IBW/kg (Calculated) : 57 Heparin Dosing Weight:   Vital Signs: Temp: 98.2 F (36.8 C) (08/26 0748) Temp Source: Oral (08/26 0748) BP: 102/59 (08/26 0748) Pulse Rate: 119 (08/26 0748)  Labs:  Recent Labs  05/31/17 2233 06/01/17 0601 06/02/17 0848 06/02/17 1729 06/02/17 1730 06/03/17 0029 06/03/17 0509  HGB 8.3*  --   --   --  8.9*  --   --   HCT 26.3*  --   --   --  28.3*  --   --   PLT 221  --   --   --  271  --   --   LABPROT  --  18.2* 26.8*  --   --   --  26.1*  INR  --  1.49 2.42  --   --   --  2.34  CREATININE 3.32*  --   --  3.31*  --   --  1.37*  TROPONINI  --   --   --   --   --  0.04* 0.04*    Estimated Creatinine Clearance: 40.5 mL/min (A) (by C-G formula based on SCr of 1.37 mg/dL (H)).   Medications:  Scheduled:  . amiodarone  200 mg Oral TID  . famotidine  20 mg Oral QHS  . feeding supplement (PRO-STAT SUGAR FREE 64)  30 mL Oral BID  . levothyroxine  100 mcg Oral QAC breakfast  . linaclotide  145 mcg Oral QAC breakfast  . megestrol  40 mg Oral BID  . metoprolol tartrate  12.5 mg Oral Q6H  . midodrine  10 mg Oral TID WC  . multivitamin  1 tablet Oral QHS  . QUEtiapine  25 mg Oral QHS  . senna-docusate  2 tablet Oral QHS  . sertraline  25 mg Oral QHS  . Warfarin - Pharmacist Dosing Inpatient   Does not apply q1800   Infusions:    Assessment: 66 yo female with afib was started on Coumadin on 8/22. Coumadin was initiated at 2.5 mg, then increased to 5 mg po. INR increased from 1.49 on 8/24 to 2.42 on 8/25. Dose was lowered to 2.5 mg and INR today is 2.34. Patient is currently on amiodarone.   Last CBC of 8/25 was stable. No bleeding noted.   Goal of Therapy:  INR 2-3 Monitor platelets by anticoagulation protocol: Yes   Plan:   Repeat Coumadin to 2.5 mg po x 1 tonight INR in am  Leroy Libman, PharmD Pharmacy Resident Pager: [phone]  06/03/2017,9:20 AM

## 2017-06-03 NOTE — Progress Notes (Signed)
Received report about patient's tachycardia from dialysis. Amiodarone tablet sent to dialysis for RN to administer. Patient arrived on floor from dialysis with portable telemetry monitor. Vitals taken with BP 113/ 97 HR sustaining at 140's to 160's. Pt. Denies any chest pain or sob. RR called and seen. Dr. Alain Marion notified and came to see the patient. EKG performed. Received orders from Dr. Alcario Drought who also came to see the patient. Metoprolol 5mg  IV push given followed by Bolus of 250 cc of IV fluid. Heart rate sustaining in 110-120's, BP 113/72. Patient comfortable in bed. Awaiting for order to transfer to stepdown ICU.

## 2017-06-03 NOTE — Progress Notes (Addendum)
Progress Note  Patient Name: [name] Date of Encounter: 06/03/2017  Primary Cardiologist: Dr. Nehemiah Massed  Subjective   66 y.o. female with a hx of end-stage renal disease, dilated cardiomyopathy ejection fraction 25%, prolonged hospitalization hypotension who is being seen back today for followup of atrial fibrillation with increased rate at the request of Dr. Rockne Menghini.  While admitted earlierl, her Cardizem was stopped due to hypotension duering HD and started on Amio 200mg  TID along with metoprolol 12.5mg  BID.  Patient was discharged to inpatient rehab and while in HD yesterday developed afib with RVR in the 160-170's and readmitted to [facility] service.  We are now asked to followup along again.  HR this am 115bpm on tele.   Inpatient Medications    Scheduled Meds: . amiodarone  200 mg Oral TID  . famotidine  20 mg Oral QHS  . feeding supplement (PRO-STAT SUGAR FREE 64)  30 mL Oral BID  . levothyroxine  100 mcg Oral QAC breakfast  . linaclotide  145 mcg Oral QAC breakfast  . megestrol  40 mg Oral BID  . metoprolol tartrate  12.5 mg Oral Q6H  . midodrine  10 mg Oral TID WC  . multivitamin  1 tablet Oral QHS  . QUEtiapine  25 mg Oral QHS  . senna-docusate  2 tablet Oral QHS  . sertraline  25 mg Oral QHS  . warfarin  2.5 mg Oral ONCE-1800  . Warfarin - Pharmacist Dosing Inpatient   Does not apply q1800   Continuous Infusions:  PRN Meds: acetaminophen, acetaminophen, ALPRAZolam, bisacodyl, ipratropium-albuterol, ondansetron (ZOFRAN) IV, ondansetron (ZOFRAN) IV, polyethylene glycol   Vital Signs    Vitals:   06/03/17 0748 06/03/17 0751 06/03/17 0753 06/03/17 0952  BP: (!) 102/59     Pulse: (!) 119   99  Resp: (!) 22 19 18 12   Temp: 98.2 F (36.8 C)     TempSrc: Oral     SpO2: 97%   97%  Weight:      Height:        Intake/Output Summary (Last 24 hours) at 06/03/17 1100 Last data filed at 06/03/17 1000  Gross per 24 hour  Intake              120 ml  Output                50 ml  Net               70 ml   Filed Weights   06/03/17 0423  Weight: 156 lb 8.4 oz (71 kg)    Telemetry    Atrial fibrillation with RVR at 113bpm - Personally Reviewed  ECG    No new EKG to review - Personally Reviewed  Physical Exam   GEN: No acute distress.   Neck: No JVD Cardiac: irregularly irregular and tachy, no murmurs, rubs, or gallops.  Respiratory: Clear to auscultation bilaterally. GI: Soft, nontender, non-distended  MS: No edema; No deformity. Neuro:  Nonfocal  Psych: Normal affect   Labs    Chemistry Recent Labs Lab 05/29/17 1614  05/31/17 2233 06/02/17 1729 06/03/17 0509  NA 136  --  136 134* 136  K 4.6  --  3.9 3.4* 3.7  CL 100*  --  98* 98* 101  CO2 23  --  26 24 25   GLUCOSE 123*  --  86 87 114*  BUN 46*  --  31* 34* 8  CREATININE 4.31*  --  3.32* 3.31* 1.37*  CALCIUM 8.9  < > 8.7* 8.5* 8.1*  ALBUMIN 3.0*  --  2.9* 2.8*  --   GFRNONAA 10*  --  14* 14* 40*  GFRAA 11*  --  16* 16* 46*  ANIONGAP 13  --  12 12 10   < > = values in this interval not displayed.   Hematology Recent Labs Lab 05/29/17 1616 05/31/17 2233 06/02/17 1730  WBC 8.6 7.8 9.9  RBC 2.56* 2.47* 2.68*  HGB 8.4* 8.3* 8.9*  HCT 27.2* 26.3* 28.3*  MCV 106.3* 106.5* 105.6*  MCH 32.8 33.6 33.2  MCHC 30.9 31.6 31.4  RDW 24.4* 24.4* 24.0*  PLT 205 221 271    Cardiac Enzymes Recent Labs Lab 06/03/17 0029 06/03/17 0509  TROPONINI 0.04* 0.04*   No results for input(s): TROPIPOC in the last 168 hours.   BNPNo results for input(s): BNP, PROBNP in the last 168 hours.   DDimer No results for input(s): DDIMER in the last 168 hours.   Radiology    No results found.  Cardiac Studies   Echocardiogram 04/08/17: - Left ventricle: The cavity size was mildly dilated. There was mild concentric hypertrophy. Systolic function was severely reduced. The estimated ejection fraction was in the range of 25% to 30%. - Aortic valve: There was moderate regurgitation. -  Mitral valve: There was mild regurgitation. - Left atrium: The atrium was mildly dilated. - Right atrium: The atrium was mildly dilated. - Tricuspid valve: There was moderate regurgitation.  Patient Profile     66 y.o. female  with a hx of end-stage renal disease, dilated cardiomyopathy ejection fraction 25%, prolonged hospitalization hypotension who is being seen back today for followup of atrial fibrillation with increased rate at the request of Dr. Rockne Menghini.  While admitted earlierl, her Cardizem was stopped due to hypotension duering HD and started on Amio 200mg  TID along with metoprolol 12.5mg  BID.  Patient was discharged to inpatient rehab and while in HD yesterday developed afib with RVR in the 160-170's and readmitted to [facility] service.  We are now asked to followup along again.  HR this am 115bpm on tele.    Assessment & Plan    1.  Persistent atrial fibrillation with RVR with CHADS2VASC score of 4 - Cardizem stopped last admission due to hypotension and  LV dysfunction  - would avoid dig in setting of ESRD - currently on Amio 200mg  TID - will increase to 400mg  BID for now to try to get rate controlled - continue metoprolol 12.5mg  BID - BP too soft to increase further - continue warfarin for anticoagulation. INR 2.34  2.  Dilated  Cardiomyopathy  - Unable to utilize traditional beta blockers and ace inhibitors because of her end-stage renal disease and hypotension.  - She would not be a candidate for advanced therapies  - continue low dose metoprolol  3.  End-stage renal disease  - Per nephrology.   Signed, Fransico Him, MD  06/03/2017, 11:00 AM

## 2017-06-04 ENCOUNTER — Inpatient Hospital Stay (HOSPITAL_COMMUNITY): Payer: Medicare Other

## 2017-06-04 ENCOUNTER — Ambulatory Visit (HOSPITAL_COMMUNITY): Payer: Medicare Other | Admitting: Physical Therapy

## 2017-06-04 ENCOUNTER — Inpatient Hospital Stay (HOSPITAL_COMMUNITY): Payer: Medicare Other | Admitting: Occupational Therapy

## 2017-06-04 ENCOUNTER — Encounter (HOSPITAL_COMMUNITY): Payer: Medicare Other | Admitting: Occupational Therapy

## 2017-06-04 DIAGNOSIS — I959 Hypotension, unspecified: Secondary | ICD-10-CM | POA: Diagnosis not present

## 2017-06-04 DIAGNOSIS — L89313 Pressure ulcer of right buttock, stage 3: Secondary | ICD-10-CM | POA: Diagnosis not present

## 2017-06-04 DIAGNOSIS — L8990 Pressure ulcer of unspecified site, unspecified stage: Secondary | ICD-10-CM | POA: Insufficient documentation

## 2017-06-04 DIAGNOSIS — N186 End stage renal disease: Secondary | ICD-10-CM | POA: Diagnosis not present

## 2017-06-04 DIAGNOSIS — G7281 Critical illness myopathy: Secondary | ICD-10-CM | POA: Diagnosis not present

## 2017-06-04 DIAGNOSIS — Z992 Dependence on renal dialysis: Secondary | ICD-10-CM

## 2017-06-04 DIAGNOSIS — I5022 Chronic systolic (congestive) heart failure: Secondary | ICD-10-CM | POA: Diagnosis not present

## 2017-06-04 DIAGNOSIS — I4891 Unspecified atrial fibrillation: Secondary | ICD-10-CM | POA: Diagnosis not present

## 2017-06-04 LAB — RENAL FUNCTION PANEL
Albumin: 3 g/dL — ABNORMAL LOW (ref 3.5–5.0)
Anion gap: 14 (ref 5–15)
BUN: 27 mg/dL — AB (ref 6–20)
CHLORIDE: 101 mmol/L (ref 101–111)
CO2: 21 mmol/L — AB (ref 22–32)
CREATININE: 2.98 mg/dL — AB (ref 0.44–1.00)
Calcium: 8.8 mg/dL — ABNORMAL LOW (ref 8.9–10.3)
GFR calc non Af Amer: 15 mL/min — ABNORMAL LOW (ref >60)
GFR, EST AFRICAN AMERICAN: 18 mL/min — AB (ref >60)
Glucose, Bld: 131 mg/dL — ABNORMAL HIGH (ref 65–99)
Phosphorus: 4.5 mg/dL (ref 2.5–4.6)
Potassium: 4.3 mmol/L (ref 3.5–5.1)
Sodium: 136 mmol/L (ref 135–145)

## 2017-06-04 LAB — PROTIME-INR
INR: 3.86
PROTHROMBIN TIME: 38.9 s — AB (ref 11.4–15.2)

## 2017-06-04 MED ORDER — SODIUM CHLORIDE 0.9 % IV SOLN
125.0000 mg | INTRAVENOUS | Status: DC
  Administered 2017-06-05: 125 mg via INTRAVENOUS
  Filled 2017-06-04 (×2): qty 10

## 2017-06-04 MED ORDER — LINACLOTIDE 145 MCG PO CAPS
145.0000 ug | ORAL_CAPSULE | Freq: Every day | ORAL | Status: DC
  Administered 2017-06-04 – 2017-06-06 (×3): 145 ug via ORAL
  Filled 2017-06-04 (×3): qty 1

## 2017-06-04 MED ORDER — POLYETHYLENE GLYCOL 3350 17 G PO PACK
17.0000 g | PACK | Freq: Two times a day (BID) | ORAL | Status: DC
  Administered 2017-06-04 – 2017-06-06 (×3): 17 g via ORAL
  Filled 2017-06-04 (×3): qty 1

## 2017-06-04 MED ORDER — MAGNESIUM CITRATE PO SOLN: 1.0000 | Freq: Once | ORAL | Status: DC

## 2017-06-04 MED ORDER — METOPROLOL TARTRATE 25 MG PO TABS
25.0000 mg | ORAL_TABLET | Freq: Two times a day (BID) | ORAL | Status: DC
  Administered 2017-06-04: 25 mg via ORAL
  Filled 2017-06-04: qty 1

## 2017-06-04 MED ORDER — BISACODYL 10 MG RE SUPP
10.0000 mg | Freq: Once | RECTAL | Status: AC
  Administered 2017-06-04: 10 mg via RECTAL
  Filled 2017-06-04: qty 1

## 2017-06-04 MED ORDER — LEVOTHYROXINE SODIUM 100 MCG PO TABS
100.0000 ug | ORAL_TABLET | Freq: Every day | ORAL | Status: DC
  Administered 2017-06-04 – 2017-06-06 (×3): 100 ug via ORAL
  Filled 2017-06-04 (×3): qty 1

## 2017-06-04 NOTE — Progress Notes (Signed)
Progress Note    [name]  MPN:[id number] DOB: Aug 21, 1951  DOA: 06/03/2017 PCP: Tracie Harrier, MD    Brief Narrative:   Chief complaint: Follow-up A. fib with RVR  Medical records reviewed and are as summarized below:  [name] is an 66 y.o. female PMH of recent prolonged hospitalization in July for treatment of cellulitis/sepsis complicated by PEA cardiac arrest, arms, kidney failure requiring no onset dialysis and upper extremity DVTs. She also has had new onset atrial fibrillation and 2-D echo showed an EF of 25-30%. Readmitted 06/02/17 after she developed A. fib with RVR with heart rate into the 160s during HD.  Assessment/Plan:   Principal Problem:   Atrial fibrillation with RVR (HCC)/Demand ischemia Given IV Lopressor and by mouth dose increased. Continue amiodarone. Heart rate 99-107 this morning. Troponin 0.042. Trend flat. Likely demand ischemia.Cardiology following and increased amiodarone 06/03/17. Metoprolol increased to 25 mg twice a day by cardiologist. Recommendations are to consider TEE guided cardioversion if rate cannot be controlled. Anticoagulated on Coumadin. INR therapeutic.  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    Critical illness myopathy PT/OT.    Hypotension Continue midodrine.    ESRD (end stage renal disease) ([place]) HD per nephrology.    Chronic systolic CHF (congestive heart failure) (HCC)/dilated cardiomyopathy 2-D echo 04/08/17 showed EF 25-30 percent. Cardiology notes that she is not a candidate for advanced therapy.  Body mass index is 26.05 kg/m.   Family Communication/Anticipated D/C date and plan/Code Status   DVT prophylaxis: Coumadin ordered. Code Status: Full Code.  Family Communication: Husband updated at the bedside. Disposition Plan: Return to CIR as soon as insurance approval has been ascertained.  Medical Consultants:     Cardiology  Nephrology   Anti-Infectives:    None  Subjective:   Constipated. Otherwise no complaints of chest pain or dyspnea.  Objective:    Vitals:   06/04/17 0340 06/04/17 0400 06/04/17 0425 06/04/17 0700  BP: 101/71  97/71   Pulse: (!) 107 99 100   Resp: 18 17 16    Temp: 98.4 F (36.9 C)  98.4 F (36.9 C) 97.8 F (36.6 C)  TempSrc: Oral  Oral Oral  SpO2: 98% 97% 94%   Weight:      Height:        Intake/Output Summary (Last 24 hours) at 06/04/17 0818 Last data filed at 06/04/17 0600  Gross per 24 hour  Intake              650 ml  Output               50 ml  Net              600 ml   Filed Weights   06/03/17 0423  Weight: 71 kg (156 lb 8.4 oz)    Exam: General: No acute distress. Cardiovascular: Heart sounds Are irregularly irregular. No gallops or rubs. No murmurs. No JVD. Lungs: Clear to auscultation bilaterally with good air movement. No rales, rhonchi or wheezes. Abdomen: Soft, nontender, nondistended with normal active bowel sounds. No masses. No hepatosplenomegaly. Neurological: Alert and oriented 3. Moves all extremities 4 with equal strength. Cranial nerves II through XII grossly intact. Skin: Stage III pressure injury right buttock, other wounds as documented by WOC. Extremities: Dressings intact, wound VAC in place. Psychiatric: Mood and affect are normal. Insight and judgment are good.  Data Reviewed:  I have personally reviewed following labs and imaging studies:  Labs: Labs show the following: Sodium 136, potassium 4.3, chloride 101, bicarbonate 21, BUN 27, creatinine 2.98, glucose 131, phosphorus 4.5, albumin 3.0.  INR 3.86.  Troponin 0.042.  TSH 2.010.  Microbiology 05/27/17: MRSA PCR negative.  Procedures and diagnostic studies:  No results found.  Medications:   . amiodarone  400 mg Oral BID  . famotidine  20 mg Oral QHS  . feeding supplement (PRO-STAT SUGAR FREE 64)  30 mL Oral BID  . Gerhardt's butt cream    Topical TID  . hydrocerin   Topical BID  . levothyroxine  100 mcg Oral QAC breakfast  . linaclotide  145 mcg Oral QAC breakfast  . megestrol  40 mg Oral BID  . metoprolol tartrate  12.5 mg Oral Q6H  . midodrine  10 mg Oral TID WC  . multivitamin  1 tablet Oral QHS  . QUEtiapine  25 mg Oral QHS  . senna-docusate  2 tablet Oral QHS  . sertraline  25 mg Oral QHS  . Warfarin - Pharmacist Dosing Inpatient   Does not apply q1800   Continuous Infusions:   LOS: 1 day   RAMA,CHRISTINA  [facility] Pager [phone]. If unable to reach me by pager, please call my cell phone at [phone].  *Please refer to [email or web address], password [email or web address] to get updated schedule on who will round on this patient, as hospitalists switch teams weekly. If 7PM-7AM, please contact night-coverage at [email or web address], password [email or web address] for any overnight needs.  06/04/2017, 8:18 AM

## 2017-06-04 NOTE — Evaluation (Addendum)
Physical Therapy Evaluation Patient Details Name: [name] MRN: [id number] DOB: October 31, 1950 Today's Date: 06/04/2017   History of Present Illness  [name] is an 66 y.o. female PMH of recent prolonged hospitalization in July for treatment of cellulitis/sepsis complicated by PEA cardiac arrest, arms, kidney failure requiring new onset dialysis and upper extremity DVTs. She also has had new onset atrial fibrillation and 2-D echo showed an EF of 25-30%. Readmitted 06/02/17 after she developed A. fib with RVR with heart rate into the 160s during HD.  Clinical Impression  Patient presents with decreased balance, decreased LE strength, decreased activity tolerance and will benefit from skilled PT in the acute setting to allow return to CIR level rehab prior to d/c home.  She will have spouse to assist, but concerned about level of weakness in legs.  Encouraged by progress this session.  Will follow along acutely.    Follow Up Recommendations CIR    Equipment Recommendations  Other (comment) (TBA at next venue)    Recommendations for Other Services Rehab consult     Precautions / Restrictions Precautions Precautions: Fall Precaution Comments: R IJ perm cath; wound vac LLE Restrictions Weight Bearing Restrictions: No      Mobility  Bed Mobility Overal bed mobility: Needs Assistance Bed Mobility: Rolling;Sidelying to Sit Rolling: Min assist Sidelying to sit: Mod assist       General bed mobility comments: to roll assist to bend L knee, then pt able to reach for railing, assist L LE off bed and to lift trunk upright  Transfers Overall transfer level: Needs assistance Equipment used: 2 person hand held assist Transfers: Sit to/from [name] Sit to Stand: Mod assist;+2 physical assistance;Max assist   Squat pivot transfers: Max assist   General transfer comment: +2 for initial sit to stand, then stood with +1 max A and second person for hygiene; then squat  pivot to recliner max A utilizing pad under pt  Ambulation/Gait                Stairs            Wheelchair Mobility    Modified Rankin (Stroke Patients Only)       Balance Overall balance assessment: Needs assistance Sitting-balance support: Single extremity supported;Feet supported Sitting balance-Leahy Scale: Good Sitting balance - Comments: EOB static sitting unaided     Standing balance-Leahy Scale: Poor Standing balance comment: +1 mod A for support to stand for hygiene with pt able to take weight on legs and holding on for balance                             Pertinent Vitals/Pain Pain Assessment: No/denies pain    Home Living Family/patient expects to be discharged to:: Inpatient rehab Living Arrangements: Spouse/significant other Available Help at Discharge: Family;Available 24 hours/day Type of Home: House Home Access: Stairs to enter Entrance Stairs-Rails: Right;Left;Can reach both Entrance Stairs-Number of Steps: 4 Home Layout: One level Home Equipment: None Additional Comments: N/A    Prior Function Level of Independence: Independent         Comments: Independent with ADLs, enjoys household tasks     Hand Dominance   Dominant Hand: Right    Extremity/Trunk Assessment   Upper Extremity Assessment Upper Extremity Assessment:Defer to OT evaluation    Lower Extremity Assessment Lower Extremity Assessment: RLE Deficits / Details: AAROM WFL, strength hip flexion 2-/5, knee extension 3-/5, ankle DF 3+/5 LLE Deficits /  Details: AAROM WFL, strength hip flexion 1+/5, knee extension 2+/5, ankle DF 3/5      Communication   Communication: No difficulties  Cognition Arousal/Alertness: Awake/alert Behavior During Therapy: WFL for tasks assessed/performed Overall Cognitive Status: Within Functional Limits for tasks assessed                                        General Comments      Exercises     Assessment/Plan    PT Assessment Patient needs continued PT services  PT Problem List Decreased activity tolerance;Decreased balance;Decreased knowledge of use of DME;Decreased range of motion;Decreased mobility;Decreased strength;Cardiopulmonary status limiting activity       PT Treatment Interventions Gait training;DME instruction;Therapeutic activities;Therapeutic exercise;Patient/family education;Balance training;Stair training;Functional mobility training;Wheelchair mobility training    PT Goals (Current goals can be found in the Care Plan section)  Acute Rehab PT Goals Patient Stated Goal: to get stronger PT Goal Formulation: With patient Time For Goal Achievement: 06/15/17 Potential to Achieve Goals: Good    Frequency     Barriers to discharge        Co-evaluation               AM-PAC PT "6 Clicks" Daily Activity  Outcome Measure Difficulty turning over in bed (including adjusting bedclothes, sheets and blankets)?: A Lot Difficulty moving from lying on back to sitting on the side of the bed? : Unable Difficulty sitting down on and standing up from a chair with arms (e.g., wheelchair, bedside commode, etc,.)?: Unable Help needed moving to and from a bed to chair (including a wheelchair)?: A Lot Help needed walking in hospital room?: Total Help needed climbing 3-5 steps with a railing? : Total 6 Click Score: 8    End of Session Equipment Utilized During Treatment: Gait belt Activity Tolerance: Patient tolerated treatment well Patient left: in chair;with call bell/phone within reach;with chair alarm set Nurse Communication: Mobility status PT Visit Diagnosis: Other abnormalities of gait and mobility (R26.89);Other symptoms and signs involving the nervous system (R29.898)    Time: 1321-1350 PT Time Calculation (min) (ACUTE ONLY): 29 min   Charges:   PT Evaluation $PT Eval Moderate Complexity: 1 Mod PT Treatments $Therapeutic Activity: 8-22 mins   PT G  Codes:   PT G-Codes **NOT FOR INPATIENT CLASS** Functional Assessment Tool Used: AM-PAC 6 Clicks Basic Mobility Functional Limitation: Mobility: Walking and moving around Mobility: Walking and Moving Around Current Status (Z6109): 100 percent impaired, limited or restricted Mobility: Walking and Moving Around Goal Status (U0454): At least 60 percent but less than 80 percent impaired, limited or restricted    [place], [place] 06/04/2017   Reginia Naas 06/04/2017, 3:00 PM

## 2017-06-04 NOTE — Progress Notes (Addendum)
Inpatient Rehabilitation  I have discussed case with rehab team, acute team as well as with patient and spouse.  Patient remains appropriate for completion of IP Rehab stay in order to complete family education and training in order to ensure a safe discharge home with spouse.  I await PT and OT evaluations to re-submit for insurance authorization as well as medical stability.  Plan to follow, please call with questions.   [name]., CCC/SLP Admission Coordinator  [place]  Cell [phone]

## 2017-06-04 NOTE — Progress Notes (Signed)
Spoke to [place] on phone, from Plastic surgery, about patient's left leg wound. Had debridement on 8/20 with placement of Acell and wound vac. Told me they would be rounding tomorrow, and will come assess her left leg & dressing. Told me to continue wound vac at 125 mmHg. Pt has had minimal drainage. Pt updated with information. Will continue to monitor.

## 2017-06-04 NOTE — Care Management CC44 (Signed)
Condition Code 44 Documentation Completed  Patient Details  Name: Ladan Vanderzanden MRN: [id number] Date of Birth: [birth year]   Condition Code 44 given:  Yes Patient signature on Condition Code 44 notice:  Yes Documentation of 2 MD's agreement:  Yes Code 44 added to claim:  Yes    Bethena Roys, RN 06/04/2017, 3:10 PM

## 2017-06-04 NOTE — Progress Notes (Signed)
Progress Note  Patient Name: [name] Date of Encounter: 06/04/2017  Primary Cardiologist: Dr Nehemiah Massed; would like to fu with Dr Rockey Situ  Subjective   No chest pain or dyspnea  Inpatient Medications    Scheduled Meds: . amiodarone  400 mg Oral BID  . famotidine  20 mg Oral QHS  . feeding supplement (PRO-STAT SUGAR FREE 64)  30 mL Oral BID  . Gerhardt's butt cream   Topical TID  . hydrocerin   Topical BID  . levothyroxine  100 mcg Oral QAC breakfast  . linaclotide  145 mcg Oral QAC breakfast  . megestrol  40 mg Oral BID  . metoprolol tartrate  12.5 mg Oral Q6H  . midodrine  10 mg Oral TID WC  . multivitamin  1 tablet Oral QHS  . QUEtiapine  25 mg Oral QHS  . senna-docusate  2 tablet Oral QHS  . sertraline  25 mg Oral QHS  . Warfarin - Pharmacist Dosing Inpatient   Does not apply q1800   Continuous Infusions:  PRN Meds: acetaminophen, acetaminophen, ALPRAZolam, bisacodyl, ipratropium-albuterol, ondansetron (ZOFRAN) IV, ondansetron (ZOFRAN) IV, polyethylene glycol   Vital Signs    Vitals:   06/04/17 0340 06/04/17 0400 06/04/17 0425 06/04/17 0700  BP: 101/71  97/71   Pulse: (!) 107 99 100   Resp: 18 17 16    Temp: 98.4 F (36.9 C)  98.4 F (36.9 C) 97.8 F (36.6 C)  TempSrc: Oral  Oral Oral  SpO2: 98% 97% 94%   Weight:      Height:        Intake/Output Summary (Last 24 hours) at 06/04/17 1047 Last data filed at 06/04/17 0900  Gross per 24 hour  Intake              770 ml  Output               50 ml  Net              720 ml   Filed Weights   06/03/17 0423  Weight: 71 kg (156 lb 8.4 oz)    Telemetry    Atrial fibrillation, rate mildly elevated- Personally Reviewed   Physical Exam   GEN: No acute distress.   Neck: No JVD Cardiac: irregular, no murmurs, rubs, or gallops.  Respiratory: Clear to auscultation bilaterally. GI: Soft, nontender, non-distended  MS: in splints and wrapped Neuro:  Nonfocal  Psych: Normal affect   Labs     Chemistry Recent Labs Lab 05/29/17 1614  05/31/17 2233 06/02/17 1729 06/03/17 0509  NA 136  --  136 134* 136  K 4.6  --  3.9 3.4* 3.7  CL 100*  --  98* 98* 101  CO2 23  --  26 24 25   GLUCOSE 123*  --  86 87 114*  BUN 46*  --  31* 34* 8  CREATININE 4.31*  --  3.32* 3.31* 1.37*  CALCIUM 8.9  < > 8.7* 8.5* 8.1*  ALBUMIN 3.0*  --  2.9* 2.8*  --   GFRNONAA 10*  --  14* 14* 40*  GFRAA 11*  --  16* 16* 46*  ANIONGAP 13  --  12 12 10   < > = values in this interval not displayed.   Hematology Recent Labs Lab 05/29/17 1616 05/31/17 2233 06/02/17 1730  WBC 8.6 7.8 9.9  RBC 2.56* 2.47* 2.68*  HGB 8.4* 8.3* 8.9*  HCT 27.2* 26.3* 28.3*  MCV 106.3* 106.5* 105.6*  MCH 32.8 33.6  33.2  MCHC 30.9 31.6 31.4  RDW 24.4* 24.4* 24.0*  PLT 205 221 271    Cardiac Enzymes Recent Labs Lab 06/03/17 0029 06/03/17 0509  TROPONINI 0.04* 0.04*    Patient Profile     66 y.o. female with a hx of end-stage renal disease, dilated cardiomyopathy ejection fraction 25%, prolonged hospitalization, hypotensionwith atrial fibrillation with RVR.  While admitted earlierl, her Cardizem was stopped due to hypotension duering HD and started on Amio 200mg  TID along with metoprolol 12.5mg  BID. Echocardiogram July 2018 showed ejection fraction 25-30%, moderate aortic insufficiency, mild mitral regurgitation, mild biatrial enlargement and moderate tricuspid regurgitation.  Assessment & Plan    1 Atrial fibrillation-duration is unclear to me. Continue Coumadin. Rate remains mildly elevated. Continue amiodarone at present dose. Increase metoprolol to 25 mg twice a day. If rate is difficult to control it might be worthwhile considering TEE guided cardioversion.   2 Cardiomyopathy-question tachycardia mediated. Not presently a good candidate for ischemia evaluation that can be considered if does not improve. Continue present dose of metoprolol. I will transition to Toprol later once dose is established. Blood  pressure will not allow ARB or hydralazine/nitrates.   3 end-stage renal disease-dialysis per nephrology. Continue monitoring.    Signed, Kirk Ruths, MD  06/04/2017, 10:47 AM

## 2017-06-04 NOTE — Progress Notes (Signed)
Inpatient Rehabilitation  This patient is know to our IP Rehab service as she was discharged back to acute after HD on Sunday morning.  Plan to follow up with team today.  Please call with questions.  Carmelia Roller., CCC/SLP Admission Coordinator  [place]  Cell [phone]

## 2017-06-04 NOTE — Progress Notes (Signed)
Patient ID: [name], female   DOB: 1951/01/29, 66 y.o.   MRN: [id number]   [facility] Progress Note   Assessment/ Plan:   1. Chronic systolic congestive heart failure/dilated cardiomyopathy: Symptoms doing better with ultrafiltration/hemodialysis 2. ESRD: Continue hemodialysis on a Tuesday/Thursday/Saturday schedule, will order for next hemodialysis tomorrow. Vein mapping done about 2 weeks ago-will consult with vascular surgery for permanent dialysis access placement. 3. 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 5. Nutrition: With protein calorie malnutrition/chronic illness-continue nutritional supplementation with feeding supplement/MVI. 6. Atrial fibrillation with RVR: Improving rate control with amiodarone and metoprolol-the latter is limited in dosing by hypotension. 7. Hypotension: On midodrine, monitor with ultrafiltration and hemodialysis  Subjective:   Reports to be feeling better at this time and denies any chest pain or shortness of breath    Objective:   BP 97/71 (BP Location: Left Arm)   Pulse 100   Temp 97.8 F (36.6 C) (Oral)   Resp 16   Ht 5\' 5"  (1.651 m)   Wt 71 kg (156 lb 8.4 oz)   SpO2 94%   BMI 26.05 kg/m   Physical Exam: DQQ:[id number] resting in bed, husband at bedside CVS: Pulse irregularly irregular tachycardia, S1 and S2 with ejection systolic murmur Resp: Diminished breath sounds over bases, anteriorly clear to auscultation Abd: Soft, flat, nontender, bowel sounds normal Ext: Trace-1+ edema bilateral lower extremities with both legs in supportive soft boots  Labs: BMET  Recent Labs Lab 05/29/17 1614 05/29/17 1616 05/31/17 2233 06/02/17 1729 06/03/17 0509  NA 136  --  136 134* 136  K 4.6  --  3.9 3.4* 3.7  CL 100*  --  98* 98* 101  CO2 23  --  26 24 25   GLUCOSE  123*  --  86 87 114*  BUN 46*  --  31* 34* 8  CREATININE 4.31*  --  3.32* 3.31* 1.37*  CALCIUM 8.9 8.7 8.7* 8.5* 8.1*  PHOS 3.9  --  3.9 3.8  --    CBC  Recent Labs Lab 05/29/17 0459 05/29/17 1616 05/31/17 2233 06/02/17 1730  WBC 9.4 8.6 7.8 9.9  HGB 8.2* 8.4* 8.3* 8.9*  HCT 26.8* 27.2* 26.3* 28.3*  MCV 106.3* 106.3* 106.5* 105.6*  PLT 192 205 221 271   Medications:    . amiodarone  400 mg Oral BID  . bisacodyl  10 mg Rectal Once  . famotidine  20 mg Oral QHS  . feeding supplement (PRO-STAT SUGAR FREE 64)  30 mL Oral BID  . Gerhardt's butt cream   Topical TID  . hydrocerin   Topical BID  . levothyroxine  100 mcg Oral QAC breakfast  . linaclotide  145 mcg Oral QAC breakfast  . megestrol  40 mg Oral BID  . metoprolol tartrate  25 mg Oral BID  . midodrine  10 mg Oral TID WC  . multivitamin  1 tablet Oral QHS  . polyethylene glycol  17 g Oral BID  . QUEtiapine  25 mg Oral QHS  . senna-docusate  2 tablet Oral QHS  . sertraline  25 mg Oral QHS  . Warfarin - Pharmacist Dosing Inpatient   Does not apply E1740   Elmarie Shiley, MD 06/04/2017, 11:46 AM

## 2017-06-04 NOTE — Care Management Obs Status (Signed)
MEDICARE OBSERVATION STATUS NOTIFICATION   Patient Details  Name: [name] MRN: [id number] Date of Birth: 02/01/1951   Medicare Observation Status Notification Given:  Yes    Bethena Roys, RN 06/04/2017, 3:10 PM

## 2017-06-04 NOTE — Consult Note (Signed)
Hospital Consult    Reason for Consult: permanent dialysis access Referring Physician:  Dr. Posey Pronto MRN #:  [id number]  History of Present Illness: This is a 66 y.o. female with ESRD in need of permanent dialysis. She is currently dialyzing via a right IJ tunneled dialysis catheter on Tuesdays, Thursdays, and Saturdays. This catheter was placed on 04/26/17 by Dr. Lucky Cowboy at [facility]. The patient is right handed. She has never had permanent access procedures in the past.  The patient was admitted from [facility] recently due to atrial fibrillation with RVR. Back in July, she had a lengthy admission secondary to bilateral lower extremity cellulitis that resulted in sepsis. During her 25 day admission, she had PEA cardiac arrest and onset renal failure.    She has a long history of atrial fibrillation on coumadin.   Past Medical History:  Diagnosis Date  . A-fib ([place])    on pradaxa  . Chronic kidney disease   . Dysrhythmia   . Hypertension   . Hypothyroidism   . Thyroid disease     Past Surgical History:  Procedure Laterality Date  . APPLICATION OF A-CELL OF EXTREMITY Left 05/28/2017   Procedure: APPLICATION OF A-CELL OF LEFT LOWER EXTREMITY;  Surgeon: Wallace Going, DO;  Location: [place];  Service: Plastics;  Laterality: Left;  . APPLICATION OF WOUND VAC Left 05/28/2017   Procedure: APPLICATION OF WOUND VAC;  Surgeon: Wallace Going, DO;  Location: [place];  Service: Plastics;  Laterality: Left;  . DIALYSIS/PERMA CATHETER INSERTION N/A 04/26/2017   Procedure: Dialysis/Perma Catheter Insertion;  Surgeon: Algernon Huxley, MD;  Location: [place] [facility];  Service: Cardiovascular;  Laterality: N/A;  . INCISION AND DRAINAGE OF WOUND Left 05/28/2017   Procedure: IRRIGATION AND DEBRIDEMENT LEFT LOWER LEG WOUND;  Surgeon: Wallace Going, DO;  Location: [place];  Service: Plastics;  Laterality: Left;  . PR DEBRIDEMENT, SKIN, SUB-Q TISSUE,=<20 SQ CM  05/21/2017      . PR DEBRIDEMENT,  SKIN, SUB-Q TISSUE,EACH ADD 20 SQ CM  05/21/2017      . WRIST ARTHROSCOPY      No Known Allergies  Prior to Admission medications   Medication Sig Start Date End Date Taking? Authorizing Provider  ALPRAZolam Duanne Moron) 0.25 MG tablet Take 0.25 mg by mouth at bedtime as needed for anxiety.    [provider]  amiodarone (PACERONE) 200 MG tablet Take 1 tablet (200 mg total) by mouth 2 (two) times daily. 05/07/17   Hillary Bow, MD  apixaban (ELIQUIS) 2.5 MG TABS tablet Take 1 tablet (2.5 mg total) by mouth 2 (two) times daily. 05/07/17   Hillary Bow, MD  diltiazem (TIAZAC) 180 MG 24 hr capsule Take 1 capsule (180 mg total) by mouth daily. 05/07/17   Hillary Bow, MD  famotidine (PEPCID) 20 MG tablet Take 1 tablet (20 mg total) by mouth at bedtime. 05/07/17   Hillary Bow, MD  levothyroxine (SYNTHROID, LEVOTHROID) 50 MCG tablet Take 50 mcg by mouth daily before breakfast.    [provider]  midodrine (PROAMATINE) 5 MG tablet Take 1 tablet (5 mg total) by mouth 3 (three) times daily with meals. 05/07/17   Hillary Bow, MD  sertraline (ZOLOFT) 25 MG tablet Take 25 mg by mouth daily.    [provider]    Social History   Social History  . Marital status: Married    Spouse name: N/A  . Number of children: N/A  . Years of education: N/A   Occupational  History  . Not on file.   Social History Main Topics  . Smoking status: Never Smoker  . Smokeless tobacco: Never Used  . Alcohol use No  . Drug use: No  . Sexual activity: Not Currently   Other Topics Concern  . Not on file   Social History Narrative  . No narrative on file     Family History  Problem Relation Age of Onset  . AAA (abdominal aortic aneurysm) Mother     ROS: [x]  Positive   [ ]  Negative   [ ]  All sytems reviewed and are negative  Cardiovascular: []  chest pain/pressure []  palpitations []  SOB lying flat []  DOE []  pain in legs while walking []  pain in legs at rest []  pain in  legs at night []  non-healing ulcers [x]  hx of DVT (bilateral upper extremities) []  swelling in legs  Pulmonary: []  productive cough []  asthma/wheezing []  home O2  Neurologic: []  weakness in []  arms []  legs []  numbness in []  arms []  legs []  hx of CVA []  mini stroke [] difficulty speaking or slurred speech []  temporary loss of vision in one eye []  dizziness  Hematologic: []  hx of cancer []  bleeding problems []  problems with blood clotting easily  Endocrine:   []  diabetes []  thyroid disease  GI []  vomiting blood []  blood in stool  GU: []  CKD/renal failure []  HD--[]  M/W/F or []  T/T/S []  burning with urination []  blood in urine  Psychiatric: []  anxiety []  depression  Musculoskeletal: []  arthritis []  joint pain  Integumentary: []  rashes []  ulcers  Constitutional: []  fever []  chills   Physical Examination  Vitals:   06/04/17 0840 06/04/17 1319  BP: 111/80   Pulse: (!) 111 99  Resp: 13 13  Temp:    SpO2: 97% 96%   Body mass index is 26.05 kg/m.  General:  WDWN in NAD Gait: Not observed HENT: WNL, normocephalic Pulmonary: normal non-labored breathing, without Rales, rhonchi,  wheezing Cardiac: irregular, without  Murmurs, rubs or gallops; without carotid bruits Abdomen:  soft, NT/ND, no masses Skin: without rashes Vascular Exam/Pulses: Weakly palpable radial pulses bilaterally. Palpable right brachial pulse. Non palpable left brachial pulse.  Extremities: Left leg with ACE and wound VAC Musculoskeletal: no muscle wasting or atrophy  Neurologic: A&O X 3; Appropriate Affect ; SENSATION: normal; MOTOR FUNCTION:  moving all extremities equally. Speech is fluent/normal Psychiatric: Appropriate mood  CBC    Component Value Date/Time   WBC 9.9 06/02/2017 1730   RBC 2.68 (L) 06/02/2017 1730   HGB 8.9 (L) 06/02/2017 1730   HGB 14.4 09/19/2014 0621   HCT 28.3 (L) 06/02/2017 1730   HCT 43.3 09/19/2014 0621   PLT 271 06/02/2017 1730   PLT 225 09/19/2014  0621   MCV 105.6 (H) 06/02/2017 1730   MCV 98 09/19/2014 0621   MCH 33.2 06/02/2017 1730   MCHC 31.4 06/02/2017 1730   RDW 24.0 (H) 06/02/2017 1730   RDW 12.6 09/19/2014 0621   LYMPHSABS 1.3 05/21/2017 0917   LYMPHSABS 2.4 09/19/2014 0621   MONOABS 0.8 05/21/2017 0917   MONOABS 0.6 09/19/2014 0621   EOSABS 0.5 05/21/2017 0917   EOSABS 0.1 09/19/2014 0621   BASOSABS 0.0 05/21/2017 0917   BASOSABS 0.0 09/19/2014 0621    BMET    Component Value Date/Time   NA 136 06/04/2017 1431   NA 140 09/19/2014 0621   K 4.3 06/04/2017 1431   K 4.1 09/19/2014 0621   CL 101 06/04/2017 1431   CL 109 (H)  09/19/2014 0621   CO2 21 (L) 06/04/2017 1431   CO2 23 09/19/2014 0621   GLUCOSE 131 (H) 06/04/2017 1431   GLUCOSE 99 09/19/2014 0621   BUN 27 (H) 06/04/2017 1431   BUN 10 09/19/2014 0621   CREATININE 2.98 (H) 06/04/2017 1431   CREATININE 0.62 09/19/2014 0621   CALCIUM 8.8 (L) 06/04/2017 1431   CALCIUM 8.7 05/29/2017 1616   GFRNONAA 15 (L) 06/04/2017 1431   GFRNONAA >60 09/19/2014 0621   GFRAA 18 (L) 06/04/2017 1431   GFRAA >60 09/19/2014 0621    COAGS: Lab Results  Component Value Date   INR 3.86 06/04/2017   INR 2.34 06/03/2017   INR 2.42 06/02/2017     Non-Invasive Vascular Imaging:   Vein mapping reviewed, adequate vein for right arm basilic vein avf  ASSESSMENT/PLAN: This is a 66 y.o. female right handed, currently on hd via right IJ catheter placed 7.19.18. Will plan for right arm 1st stage basilic vein transposition. Will need to hold coumadin. Plan for surgery once INR <1.8.   Virgina Jock, PA-C [facility]   I have independently interviewed and examined patient and agree with PA assessment and plan above. Right arm avf when inr corrected.   Reann Dobias C. [name], MD [facility] [place] Office: [phone] Pager: [phone]

## 2017-06-04 NOTE — Care Management Note (Signed)
Case Management Note  Patient Details  Name: Mylene Bow MRN: [id number] Date of Birth: [birth year]  Subjective/Objective:  Pt presented for Atrial Fib from CIR. Plan for return to CIR once insurance is authorized.                   Action/Plan: CM will continue to monitor for additional needs.   Expected Discharge Date:                  Expected Discharge Plan:  [place]  In-House Referral:  NA  Discharge planning Services  CM Consult  Post Acute Care Choice:  IP Rehab Choice offered to:  NA  DME Arranged:  N/A DME Agency:  NA  HH Arranged:  NA HH Agency:  NA  Status of Service:  Completed, signed off  If discussed at [place] of Stay Meetings, dates discussed:    Additional Comments:  Bethena Roys, RN 06/04/2017, 3:09 PM

## 2017-06-04 NOTE — Progress Notes (Signed)
Occupational Therapy Evaluation Patient Details Name: Magdaline Zollars MRN: [id number] DOB: 04/23/1951 Today's Date: 06/04/2017    History of Present Illness Acelyn Basham is an 66 y.o. female PMH of recent prolonged hospitalization in July for treatment of cellulitis/sepsis complicated by PEA cardiac arrest, arms, kidney failure requiring new onset dialysis and upper extremity DVTs. She also has had new onset atrial fibrillation and 2-D echo showed an EF of 25-30%. Readmitted 06/02/17 after she developed A. fib with RVR with heart rate into the 160s during HD.   Clinical Impression   PTA, pt independent with ADL and mobility. Pt currently requires Max A +2 with functional mobility and Mod A with ADL. Pt very motivated to maximinze her functional level of independence and will benefit from rehab at CIR to improve her  functional level of performance. Pt with excellent participation and is appropriate for OT at CIR when medically stable. Will follow acutely to address established goals and facilitate DC to CIR.     Follow Up Recommendations  [facility]    Equipment Recommendations  3 in 1 bedside commode    Recommendations for Other Services Rehab consult     Precautions / Restrictions Precautions Precautions: Fall Precaution Comments: R IJ perm cath; wound vac LLE Restrictions Weight Bearing Restrictions: No      Mobility Bed Mobility               General bed mobility comments: OOB in chair  Transfers Overall transfer level: Needs assistance Equipment used: Rolling walker (2 wheeled) Transfers: Sit to/from Stand Sit to Stand: Max assist;+2 physical assistance         General transfer comment: Pt unable to clear buttocks from chair base when attempitng to stand    Balance Overall balance assessment: Needs assistance Sitting-balance support: Single extremity supported;Feet supported Sitting balance-Leahy Scale: Fair                                     ADL  either performed or assessed with clinical judgement   ADL Overall ADL's : Needs assistance/impaired Eating/Feeding: Modified independent;Sitting   Grooming: Sitting;Wash/dry hands;Wash/dry face;Oral care;Set up Grooming Details (indicate cue type and reason): Pt tolerated sitting EOB for 25 minutes today with supervision and set up to wash hands/face and brush teeth. Pt demonstrating continued improvement in overall strength and activity tolerance for self care tasks.  Upper Body Bathing: Minimal assistance;Sitting   Lower Body Bathing: Bed level;Moderate assistance   Upper Body Dressing : Minimal assistance;Sitting   Lower Body Dressing: Moderate assistance;Sitting/lateral leans               Functional mobility during ADLs: +2 for physical assistance;Maximal assistance       Vision Baseline Vision/History: Wears glasses Wears Glasses: At all times Vision Assessment?: No apparent visual deficits     Perception     Praxis      Pertinent Vitals/Pain Pain Assessment: No/denies pain     Hand Dominance Right   Extremity/Trunk Assessment Upper Extremity Assessment Upper Extremity Assessment: RUE deficits/detail;LUE deficits/detail RUE Deficits / Details: R shoulder weakness. Able to complete FF to @ 60; painfree ROM; capular weakness. ? RTC insufficiency RUE Coordination: decreased gross motor LUE Deficits / Details: generalized weakness   Lower Extremity Assessment Lower Extremity Assessment: Defer to PT evaluation RLE Deficits / Details: AAROM WFL, strength hip flexion 2-/5, knee extension 3-/5, ankle DF 3+/5 LLE Deficits / Details: AAROM  WFL, strength hip flexion 1+/5, knee extension 2+/5, ankle DF 3/5   Cervical / Trunk Assessment Cervical / Trunk Assessment: Normal;Other exceptions Cervical / Trunk Exceptions: posterior pelvic tilt. weak core   Communication Communication Communication: No difficulties   Cognition Arousal/Alertness: Awake/alert Behavior  During Therapy: WFL for tasks assessed/performed Overall Cognitive Status: Within Functional Limits for tasks assessed                                     General Comments       Exercises Other Exercises Other Exercises: BUE AAROM shoulder FF - using stronger LUE to complete shoulder ROM - self ROM; towel slides on tray table  - RUE Other Exercises: core strengthening in sitting  Encouraged pt to complete BUE strengthening theraband ex   Shoulder Instructions      Home Living Family/patient expects to be discharged to:: Inpatient rehab Living Arrangements: Spouse/significant other Available Help at Discharge: Family;Available 24 hours/day Type of Home: House Home Access: Stairs to enter [employer] of Steps: 4 Entrance Stairs-Rails: Right;Left;Can reach both Home Layout: One level     Bathroom Shower/Tub: Tub/shower unit;Walk-in shower   Bathroom Toilet: Standard Bathroom Accessibility: Yes How Accessible: Accessible via walker Home Equipment: None   Additional Comments: N/A  Lives With: Spouse    Prior Functioning/Environment Level of Independence: Independent        Comments: Independent with ADLs, enjoys household tasks        OT Problem List: Decreased strength;Decreased range of motion;Increased edema;Decreased activity tolerance;Decreased knowledge of use of DME or AE;Impaired balance (sitting and/or standing);Impaired UE functional use;Decreased coordination;Cardiopulmonary status limiting activity;Obesity;Impaired tone      OT Treatment/Interventions: Self-care/ADL training;Therapeutic exercise;Therapeutic activities;DME and/or AE instruction;Energy conservation;Patient/family education;Balance training    OT Goals(Current goals can be found in the care plan section) Acute Rehab OT Goals Patient Stated Goal: to get stronger OT Goal Formulation: With patient Time For Goal Achievement: 06/18/17 Potential to Achieve Goals: Good   OT Frequency: Min 3X/week   Barriers to D/C: Inaccessible home environment          Co-evaluation              AM-PAC PT "6 Clicks" Daily Activity     Outcome Measure Help from another person eating meals?: None Help from another person taking care of personal grooming?: A Little Help from another person toileting, which includes using toliet, bedpan, or urinal?: A Lot Help from another person bathing (including washing, rinsing, drying)?: A Lot Help from another person to put on and taking off regular upper body clothing?: A Little Help from another person to put on and taking off regular lower body clothing?: A Lot 6 Click Score: 16   End of Session Nurse Communication: Mobility status  Activity Tolerance: Patient tolerated treatment well Patient left: with call bell/phone within reach;in chair  OT Visit Diagnosis: Other abnormalities of gait and mobility (R26.89);Muscle weakness (generalized) (M62.81)                Time: 6546-5035 OT Time Calculation (min): 19 min Charges:  OT General Charges $OT Visit: 1 Visit OT Evaluation $OT Eval Moderate Complexity: 1 Mod G-Codes: OT G-codes **NOT FOR INPATIENT CLASS** Functional Assessment Tool Used: Clinical judgement Functional Limitation: Self care Self Care Current Status (W6568): At least 60 percent but less than 80 percent impaired, limited or restricted Self Care Goal Status (L2751): At least 1  percent but less than 20 percent impaired, limited or restricted   [facility], OT/L  [phone] 06/04/2017  Trebor Galdamez,HILLARY 06/04/2017, 2:52 PM

## 2017-06-04 NOTE — Progress Notes (Signed)
ANTICOAGULATION CONSULT NOTE - Follow Up Consult  Pharmacy Consult for coumadin Indication: atrial fibrillation  No Known Allergies  Patient Measurements: Height: 5\' 5"  (165.1 cm) Weight: 156 lb 8.4 oz (71 kg) IBW/kg (Calculated) : 57 Heparin Dosing Weight:   Vital Signs: Temp: 97.8 F (36.6 C) (08/27 0700) Temp Source: Oral (08/27 0700) BP: 111/80 (08/27 0840) Pulse Rate: 99 (08/27 1319)  Labs:  Recent Labs  06/02/17 0848 06/02/17 1729 06/02/17 1730 06/03/17 0029 06/03/17 0509 06/04/17 1431 06/04/17 1458  HGB  --   --  8.9*  --   --   --   --   HCT  --   --  28.3*  --   --   --   --   PLT  --   --  271  --   --   --   --   LABPROT 26.8*  --   --   --  26.1*  --  38.9*  INR 2.42  --   --   --  2.34  --  3.86  CREATININE  --  3.31*  --   --  1.37* 2.98*  --   TROPONINI  --   --   --  0.04* 0.04*  --   --     Estimated Creatinine Clearance: 18.6 mL/min (A) (by C-G formula based on SCr of 2.98 mg/dL (H)).   Medications:  Scheduled:  . amiodarone  400 mg Oral BID  . bisacodyl  10 mg Rectal Once  . famotidine  20 mg Oral QHS  . feeding supplement (PRO-STAT SUGAR FREE 64)  30 mL Oral BID  . Gerhardt's butt cream   Topical TID  . hydrocerin   Topical BID  . levothyroxine  100 mcg Oral QAC breakfast  . linaclotide  145 mcg Oral QAC breakfast  . megestrol  40 mg Oral BID  . metoprolol tartrate  25 mg Oral BID  . midodrine  10 mg Oral TID WC  . multivitamin  1 tablet Oral QHS  . polyethylene glycol  17 g Oral BID  . QUEtiapine  25 mg Oral QHS  . senna-docusate  2 tablet Oral QHS  . sertraline  25 mg Oral QHS  . Warfarin - Pharmacist Dosing Inpatient   Does not apply q1800   Infusions:  . [START ON 06/05/2017] ferric gluconate (FERRLECIT/NULECIT) IV      Assessment: 66 yo female with afib is currently on supratherapeutic coumadin.  INR today is 3.86.  Goal of Therapy:  INR 2-3 Monitor platelets by anticoagulation protocol: Yes   Plan:  - hold coumadin  tonight - INR in am  [name], [name] 06/04/2017,4:21 PM

## 2017-06-04 NOTE — Progress Notes (Signed)
Inpatient Rehabilitation  Note that PT and OT are recommending IP Rehab for post acute therapies.  I have initiated insurance authorization.  Plan to follow for timing of medical readiness and insurance authorization.  Please call with questions.   Carmelia Roller., CCC/SLP Admission Coordinator  [place]  Cell [phone]

## 2017-06-05 ENCOUNTER — Other Ambulatory Visit: Payer: Self-pay

## 2017-06-05 DIAGNOSIS — N186 End stage renal disease: Secondary | ICD-10-CM | POA: Diagnosis not present

## 2017-06-05 DIAGNOSIS — I5022 Chronic systolic (congestive) heart failure: Secondary | ICD-10-CM | POA: Diagnosis not present

## 2017-06-05 DIAGNOSIS — L89613 Pressure ulcer of right heel, stage 3: Secondary | ICD-10-CM | POA: Diagnosis not present

## 2017-06-05 DIAGNOSIS — G7281 Critical illness myopathy: Secondary | ICD-10-CM | POA: Diagnosis not present

## 2017-06-05 DIAGNOSIS — L89313 Pressure ulcer of right buttock, stage 3: Secondary | ICD-10-CM | POA: Diagnosis not present

## 2017-06-05 DIAGNOSIS — I132 Hypertensive heart and chronic kidney disease with heart failure and with stage 5 chronic kidney disease, or end stage renal disease: Secondary | ICD-10-CM | POA: Diagnosis not present

## 2017-06-05 DIAGNOSIS — I4891 Unspecified atrial fibrillation: Secondary | ICD-10-CM | POA: Diagnosis not present

## 2017-06-05 LAB — CBC
HEMATOCRIT: 29.4 % — AB (ref 36.0–46.0)
HEMOGLOBIN: 9 g/dL — AB (ref 12.0–15.0)
MCH: 33.2 pg (ref 26.0–34.0)
MCHC: 30.6 g/dL (ref 30.0–36.0)
MCV: 108.5 fL — ABNORMAL HIGH (ref 78.0–100.0)
Platelets: 333 10*3/uL (ref 150–400)
RBC: 2.71 MIL/uL — ABNORMAL LOW (ref 3.87–5.11)
RDW: 25 % — ABNORMAL HIGH (ref 11.5–15.5)
WBC: 7.8 10*3/uL (ref 4.0–10.5)

## 2017-06-05 LAB — RENAL FUNCTION PANEL
ALBUMIN: 2.9 g/dL — AB (ref 3.5–5.0)
ANION GAP: 15 (ref 5–15)
BUN: 35 mg/dL — ABNORMAL HIGH (ref 6–20)
CO2: 21 mmol/L — ABNORMAL LOW (ref 22–32)
Calcium: 8.7 mg/dL — ABNORMAL LOW (ref 8.9–10.3)
Chloride: 100 mmol/L — ABNORMAL LOW (ref 101–111)
Creatinine, Ser: 3.56 mg/dL — ABNORMAL HIGH (ref 0.44–1.00)
GFR calc Af Amer: 14 mL/min — ABNORMAL LOW (ref >60)
GFR calc non Af Amer: 12 mL/min — ABNORMAL LOW (ref >60)
GLUCOSE: 100 mg/dL — AB (ref 65–99)
PHOSPHORUS: 5.9 mg/dL — AB (ref 2.5–4.6)
POTASSIUM: 4.7 mmol/L (ref 3.5–5.1)
Sodium: 136 mmol/L (ref 135–145)

## 2017-06-05 LAB — PROTIME-INR
INR: 1.3
INR: 3.67
Prothrombin Time: 16.3 seconds — ABNORMAL HIGH (ref 11.4–15.2)
Prothrombin Time: 36.2 seconds — ABNORMAL HIGH (ref 11.4–15.2)

## 2017-06-05 MED ORDER — MIDODRINE HCL 5 MG PO TABS
ORAL_TABLET | ORAL | Status: AC
  Filled 2017-06-05: qty 2

## 2017-06-05 MED ORDER — DARBEPOETIN ALFA 60 MCG/0.3ML IJ SOSY: 60.0000 ug | PREFILLED_SYRINGE | INTRAMUSCULAR | Status: DC

## 2017-06-05 MED ORDER — METOPROLOL TARTRATE 50 MG PO TABS
50.0000 mg | ORAL_TABLET | Freq: Two times a day (BID) | ORAL | Status: DC
  Administered 2017-06-05 – 2017-06-06 (×3): 50 mg via ORAL
  Filled 2017-06-05 (×3): qty 1

## 2017-06-05 MED ORDER — HEPARIN SODIUM (PORCINE) 1000 UNIT/ML DIALYSIS: 40.0000 [IU]/kg | INTRAMUSCULAR | Status: DC | PRN

## 2017-06-05 NOTE — Progress Notes (Signed)
ANTICOAGULATION CONSULT NOTE - Follow Up Consult  Pharmacy Consult for Coumadin Indication: atrial fibrillation  No Known Allergies  Patient Measurements: Height: 5\' 5"  (165.1 cm) Weight: 159 lb 13.3 oz (72.5 kg) IBW/kg (Calculated) : 57  Vital Signs: Temp: 97.7 F (36.5 C) (08/28 1215) Temp Source: Oral (08/28 1215) BP: 113/71 (08/28 1215) Pulse Rate: 111 (08/28 1215)  Labs:  Recent Labs  06/02/17 1730 06/03/17 0029  06/03/17 0509 06/04/17 1431 06/04/17 1458 06/05/17 0500 06/05/17 0845 06/05/17 1425  HGB 8.9*  --   --   --   --   --   --  9.0*  --   HCT 28.3*  --   --   --   --   --   --  29.4*  --   PLT 271  --   --   --   --   --   --  333  --   LABPROT  --   --   < > 26.1*  --  38.9* 16.3*  --  36.2*  INR  --   --   < > 2.34  --  3.86 1.30  --  3.67  CREATININE  --   --   --  1.37* 2.98*  --   --  3.56*  --   TROPONINI  --  0.04*  --  0.04*  --   --   --   --   --   < > = values in this interval not displayed.  Estimated Creatinine Clearance: 15.7 mL/min (A) (by C-G formula based on SCr of 3.56 mg/dL (H)).  Assessment: 66 yo female with afib is currently on supratherapeutic coumadin.  INR today was confirmed as 3.67, after a potential INR lab error this AM.  Goal of Therapy:  INR 2-3 Monitor platelets by anticoagulation protocol: Yes   Plan:  - Hold coumadin tonight - INR in AM   Diana L. Kyung Rudd, PharmD, [place] PGY1 Pharmacy Resident Pager: [phone]

## 2017-06-05 NOTE — Progress Notes (Signed)
Progress Note    [name]  GXQ:[id number] DOB: 03-23-1951  DOA: 06/03/2017 PCP: Tracie Harrier, MD    Brief Narrative:   Chief complaint: Follow-up A. fib with RVR  Medical records reviewed and are as summarized below:  [name] is an 66 y.o. female PMH of recent prolonged hospitalization in July for treatment of cellulitis/sepsis complicated by PEA cardiac arrest, arms, kidney failure requiring no onset dialysis and upper extremity DVTs. She also has had new onset atrial fibrillation and 2-D echo showed an EF of 25-30%. Readmitted 06/02/17 after she developed A. fib with RVR with heart rate into the 160s during HD.  Assessment/Plan:   Principal Problem:   Atrial fibrillation with RVR (HCC)/Demand ischemia Given IV Lopressor and by mouth dose increased. Continue amiodarone. Heart rate 99-107 this morning. Troponin 0.042. Trend flat. Likely demand ischemia.Cardiology following and increased amiodarone 06/03/17. Metoprolol increased to 25 mg twice a day by cardiologist. Recommendations are to consider TEE guided cardioversion if rate cannot be controlled. Anticoagulated on Coumadin. INR therapeutic.  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    Critical illness myopathy PT/OT.    Hypotension Continue midodrine.    ESRD (end stage renal disease) ([place]) HD per nephrology.    Chronic systolic CHF (congestive heart failure) (HCC)/dilated cardiomyopathy 2-D echo 04/08/17 showed EF 25-30 percent. Cardiology notes that she is not a candidate for advanced therapy.    Left leg wound Continue wound VAC.   Family Communication/Anticipated D/C date and plan/Code Status   DVT prophylaxis: Coumadin ordered. Code Status: Full Code.  Family Communication: Husband updated at the bedside. Disposition Plan: Return to CIR as soon as insurance approval has been ascertained.  Medical Consultants:     Cardiology  Nephrology   Anti-Infectives:    None  Subjective:   Feels well.  Some pain with wound VAC change today.  No dizziness, nausea or vomiting.  Objective:    Vitals:   06/04/17 2015 06/04/17 2300 06/05/17 0040 06/05/17 0511  BP: 118/77  112/83 108/79  Pulse: (!) 132 (!) 110  (!) 138  Resp: 16  18 14   Temp: 98.4 F (36.9 C)  98 F (36.7 C) 97.8 F (36.6 C)  TempSrc:      SpO2: 99%  96% 100%  Weight:      Height:        Intake/Output Summary (Last 24 hours) at 06/05/17 0816 Last data filed at 06/05/17 0500  Gross per 24 hour  Intake              960 ml  Output              350 ml  Net              610 ml   Filed Weights   06/03/17 0423  Weight: 71 kg (156 lb 8.4 oz)    Exam: General: No acute distress. Cardiovascular: HSIR. No gallops or rubs. No murmurs. No JVD. Lungs: Clear to auscultation bilaterally with good air movement. No rales, rhonchi or wheezes. Abdomen: Soft, nontender, nondistended with normal active bowel sounds. No masses. No hepatosplenomegaly. Neurological: Alert and oriented 3. Moves all extremities 4 with equal strength. Cranial nerves II through XII grossly intact. Skin: Stage III wound to buttock on right. Extremities: LLE wound with wound VAC. Psychiatric: Mood and affect are normal. Insight and judgment are good.   Data Reviewed:  I have personally reviewed following labs and imaging studies:  Labs: Labs show the following: Sodium 136, potassium 4.3, chloride 101, bicarbonate 21, BUN 27, creatinine 2.98, glucose 131, phosphorus 4.5, albumin 3.0.  INR 3.86.  Troponin 0.042.  TSH 2.010.  Microbiology 05/27/17: MRSA PCR negative.  Procedures and diagnostic studies:  No results found.  Medications:   . amiodarone  400 mg Oral BID  . famotidine  20 mg Oral QHS  . feeding supplement (PRO-STAT SUGAR FREE 64)  30 mL Oral BID  . Gerhardt's butt cream   Topical TID  . hydrocerin   Topical BID  .  levothyroxine  100 mcg Oral QAC breakfast  . linaclotide  145 mcg Oral QAC breakfast  . megestrol  40 mg Oral BID  . metoprolol tartrate  25 mg Oral BID  . midodrine  10 mg Oral TID WC  . multivitamin  1 tablet Oral QHS  . polyethylene glycol  17 g Oral BID  . QUEtiapine  25 mg Oral QHS  . senna-docusate  2 tablet Oral QHS  . sertraline  25 mg Oral QHS  . Warfarin - Pharmacist Dosing Inpatient   Does not apply q1800   Continuous Infusions: . ferric gluconate (FERRLECIT/NULECIT) IV       LOS: 1 day   Jhordan Kinter  [facility] Pager [phone]. If unable to reach me by pager, please call my cell phone at [phone].  *Please refer to [email or web address], password [email or web address] to get updated schedule on who will round on this patient, as hospitalists switch teams weekly. If 7PM-7AM, please contact night-coverage at [email or web address], password [email or web address] for any overnight needs.  06/05/2017, 8:16 AM

## 2017-06-05 NOTE — Progress Notes (Signed)
Progress Note  Patient Name: [name] Date of Encounter: 06/05/2017  Primary Cardiologist: Dr Nehemiah Massed; would like to fu with Dr Rockey Situ  Subjective   Pt denies chest pain or dyspnea  Inpatient Medications    Scheduled Meds: . amiodarone  400 mg Oral BID  . famotidine  20 mg Oral QHS  . feeding supplement (PRO-STAT SUGAR FREE 64)  30 mL Oral BID  . Gerhardt's butt cream   Topical TID  . hydrocerin   Topical BID  . levothyroxine  100 mcg Oral QAC breakfast  . linaclotide  145 mcg Oral QAC breakfast  . megestrol  40 mg Oral BID  . metoprolol tartrate  25 mg Oral BID  . midodrine      . midodrine  10 mg Oral TID WC  . multivitamin  1 tablet Oral QHS  . polyethylene glycol  17 g Oral BID  . QUEtiapine  25 mg Oral QHS  . senna-docusate  2 tablet Oral QHS  . sertraline  25 mg Oral QHS  . Warfarin - Pharmacist Dosing Inpatient   Does not apply q1800   Continuous Infusions: . ferric gluconate (FERRLECIT/NULECIT) IV     PRN Meds: acetaminophen, acetaminophen, ALPRAZolam, bisacodyl, ipratropium-albuterol, ondansetron (ZOFRAN) IV, ondansetron (ZOFRAN) IV   Vital Signs    Vitals:   06/05/17 1100 06/05/17 1130 06/05/17 1200 06/05/17 1215  BP: 100/76 93/65 101/69 113/71  Pulse: 74 74 78 (!) 111  Resp:    17  Temp:    97.7 F (36.5 C)  TempSrc:    Oral  SpO2:    100%  Weight:    72.5 kg (159 lb 13.3 oz)  Height:        Intake/Output Summary (Last 24 hours) at 06/05/17 1323 Last data filed at 06/05/17 1215  Gross per 24 hour  Intake              480 ml  Output             2500 ml  Net            -2020 ml   Filed Weights   06/03/17 0423 06/05/17 0835 06/05/17 1215  Weight: 71 kg (156 lb 8.4 oz) 75 kg (165 lb 5.5 oz) 72.5 kg (159 lb 13.3 oz)    Telemetry    Atrial fibrillation, rate mildly elevated- Personally Reviewed   Physical Exam   GEN: WD/WN No acute distress.   Neck: Supple Cardiac: irregular, mildly tachycardic  Respiratory: Clear to auscultation  bilaterally. No wheeze GI: Soft, nontender, non-distended, no masses palpated MS: in splints and wrapped, no change Neuro:  grossly intact   Labs    Chemistry  Recent Labs Lab 06/02/17 1729 06/03/17 0509 06/04/17 1431 06/05/17 0845  NA 134* 136 136 136  K 3.4* 3.7 4.3 4.7  CL 98* 101 101 100*  CO2 24 25 21* 21*  GLUCOSE 87 114* 131* 100*  BUN 34* 8 27* 35*  CREATININE 3.31* 1.37* 2.98* 3.56*  CALCIUM 8.5* 8.1* 8.8* 8.7*  ALBUMIN 2.8*  --  3.0* 2.9*  GFRNONAA 14* 40* 15* 12*  GFRAA 16* 46* 18* 14*  ANIONGAP 12 10 14 15      Hematology  Recent Labs Lab 05/31/17 2233 06/02/17 1730 06/05/17 0845  WBC 7.8 9.9 7.8  RBC 2.47* 2.68* 2.71*  HGB 8.3* 8.9* 9.0*  HCT 26.3* 28.3* 29.4*  MCV 106.5* 105.6* 108.5*  MCH 33.6 33.2 33.2  MCHC 31.6 31.4 30.6  RDW 24.4*  24.0* 25.0*  PLT 221 271 333    Cardiac Enzymes  Recent Labs Lab 06/03/17 0029 06/03/17 0509  TROPONINI 0.04* 0.04*    Patient Profile     66 y.o. female with a hx of end-stage renal disease, dilated cardiomyopathy ejection fraction 25%, prolonged hospitalization, hypotensionwith atrial fibrillation with RVR.  While admitted earlier her Cardizem was stopped due to hypotension during HD and started on Amio 200mg  TID along with metoprolol 12.5mg  BID. Echocardiogram July 2018 showed ejection fraction 25-30%, moderate aortic insufficiency, mild mitral regurgitation, mild biatrial enlargement and moderate tricuspid regurgitation.  Assessment & Plan    1 Atrial fibrillation-duration unclear. Continue Coumadin. Rate remains mildly elevated. Continue amiodarone at present dose. Increase metoprolol to 50 mg twice a day. If rate is difficult to control it might be worthwhile considering TEE guided cardioversion.   2 Cardiomyopathy-question tachycardia mediated. Not presently a good candidate for ischemia evaluation but can be considered if does not improve. Continue metoprolol. I will transition to Toprol later once  dose is established. Blood pressure will not allow ARB or hydralazine/nitrates.   3 end-stage renal disease-dialysis per nephrology. Continue monitoring.    Signed, Kirk Ruths, MD  06/05/2017, 1:23 PM

## 2017-06-05 NOTE — Progress Notes (Signed)
   Subjective/Chief Complaint: Patient reports she is doing well. To have AVF placed for HD soon.  Hopefully back to inpatient rehab soon.   Left foot/lower leg VAC functioning well. Dressing removed and Acell partially incorporated. No signs of infection or leg edema.   VAC dressing was reapplied over Adaptic layer and therapy resumed at 125 mm Hg continuous therapy.  Will need another VAC dressing change next week.    Objective: Vital signs in last 24 hours: Temp:  [97.5 F (36.4 C)-98.4 F (36.9 C)] 97.7 F (36.5 C) (08/28 1215) Pulse Rate:  [65-138] 111 (08/28 1215) Resp:  [14-18] 17 (08/28 1215) BP: (93-118)/(43-83) 113/71 (08/28 1215) SpO2:  [96 %-100 %] 100 % (08/28 1215) Weight:  [72.5 kg (159 lb 13.3 oz)-75 kg (165 lb 5.5 oz)] 72.5 kg (159 lb 13.3 oz) (08/28 1215) Last BM Date: 06/03/17  Intake/Output from previous day: 08/27 0701 - 08/28 0700 In: 960 [P.O.:960] Out: 350 [Urine:350] Intake/Output this shift: Total I/O In: -  Out: 2500 [Other:2500]   Lab Results:   Recent Labs  06/02/17 1730 06/05/17 0845  WBC 9.9 7.8  HGB 8.9* 9.0*  HCT 28.3* 29.4*  PLT 271 333   BMET  Recent Labs  06/04/17 1431 06/05/17 0845  NA 136 136  K 4.3 4.7  CL 101 100*  CO2 21* 21*  GLUCOSE 131* 100*  BUN 27* 35*  CREATININE 2.98* 3.56*  CALCIUM 8.8* 8.7*   PT/INR  Recent Labs  06/05/17 0500 06/05/17 1425  LABPROT 16.3* 36.2*  INR 1.30 3.67   ABG No results for input(s): PHART, HCO3 in the last 72 hours.  Invalid input(s): PCO2, PO2  Studies/Results: No results found.  Anti-infectives: Anti-infectives    None      Assessment/Plan: s/p Procedure(s): ARTERIOVENOUS (AV) FISTULA CREATION (Right) INSERTION OF ARTERIOVENOUS (AV) GORE-TEX GRAFT ARM (Right) Will follow up next week for VAC dressing change.   LOS: 1 day    [facility] [phone]

## 2017-06-05 NOTE — Progress Notes (Signed)
Inpatient Rehabilitation  Continuing to follow along for timing of medical readiness (note tentative surgery plans for tomorrow), insurance authorization, and IP Rehab bed availability.  Please call with questions.   Carmelia Roller., CCC/SLP Admission Coordinator  [place]  Cell [phone]

## 2017-06-05 NOTE — Procedures (Signed)
Patient seen on Hemodialysis. QB 300, UF goal 3L Treatment adjusted as needed.  Elmarie Shiley MD [facility]. Office # [phone] Pager # [phone] 9:26 AM

## 2017-06-05 NOTE — Discharge Summary (Signed)
Physician Discharge Summary  Patient ID: [name] MRN: [id number] DOB/AGE: [birth year] 66 y.o.  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   Significant Diagnostic Studies: Dg Chest 2 View  Result Date: 05/30/2017 CLINICAL DATA:  CHF. EXAM: CHEST  2 VIEW COMPARISON:  Chest x-ray dated April 21, 2017. FINDINGS: Interval removal of bilateral internal jugular central venous catheters. Interval placement of a tunneled right internal jugular dialysis catheter with the tip at the cavoatrial junction. Interval removal of previously seen enteric tube. Cardiomegaly, unchanged. Mild pulmonary vascular congestion. Mild hazy opacification of the right lung base, likely a combination of pleural effusion and atelectasis. No consolidation or pneumothorax. No acute osseous abnormality. IMPRESSION: 1. Cardiomegaly with mild pulmonary vascular congestion. 2. Small right pleural effusion with adjacent right lower lobe opacity, likely atelectasis. Electronically Signed   By: Titus Dubin M.D.   On: 05/30/2017 15:44   US Transvaginal Non-ob  Result Date: 05/17/2017 CLINICAL DATA:  Vaginal bleeding. EXAM: TRANSABDOMINAL AND TRANSVAGINAL ULTRASOUND OF PELVIS TECHNIQUE: Both transabdominal and transvaginal ultrasound examinations of the pelvis were performed. Transabdominal technique was performed for global imaging of the pelvis including uterus, ovaries, adnexal regions, and pelvic cul-de-sac. It was necessary to  proceed with endovaginal exam following the transabdominal exam to visualize the endometrium. COMPARISON:  None. FINDINGS: Uterus Measurements: 7.2 x 3.7 x 5.1 cm. No fibroids. Endometrium Thickness: 22 mm, abnormal. There is a 3.7 x 2.0 x 3.4 cm hyperechoic endometrial mass and a small amount of endometrial fluid. Right ovary Not well visualized. Possible 2.7 x 1.6 x 1.4 cm cystic lesion in the right adnexa. Left ovary Not visualized.  No adnexal mass. Other findings Trace free fluid. IMPRESSION: 1. Markedly abnormal and thickened endometrium containing a focal 3.7 cm hyperechoic mass. In the setting of post-menopausal bleeding, endometrial sampling is indicated to exclude carcinoma. If results are benign, sonohysterogram should be considered for focal lesion work-up prior to hysteroscopy. (Ref: Radiological Reasoning: Algorithmic Workup of Abnormal Vaginal Bleeding with Endovaginal Sonography and Sonohysterography. AJR 2008; 732:K02-54) 2. Possible 2.7 cm cystic lesion in the right adnexa, not well seen on transvaginal images. This is almost certainly benign, but follow up ultrasound is recommended in 1 year according to the Society of Radiologists in [place] Statement (D Clovis Riley et al. Management of Asymptomatic Ovarian and Other Adnexal Cysts Imaged at [place]: Society of Radiologists in [place] Statement 2010. Radiology 256 (Sept 2010): 270-623.). Electronically Signed   By: Titus Dubin M.D.   On: 05/17/2017 11:39   US Pelvis Complete  Result Date: 05/17/2017 CLINICAL DATA:  Vaginal bleeding. EXAM: TRANSABDOMINAL AND TRANSVAGINAL ULTRASOUND OF PELVIS TECHNIQUE: Both transabdominal and transvaginal ultrasound examinations of the pelvis were performed. Transabdominal technique was performed for global imaging of the pelvis including uterus, ovaries, adnexal regions, and pelvic cul-de-sac. It was necessary to proceed with endovaginal exam following the  transabdominal exam to visualize the endometrium. COMPARISON:  None. FINDINGS: Uterus Measurements: 7.2  x 3.7 x 5.1 cm. No fibroids. Endometrium Thickness: 22 mm, abnormal. There is a 3.7 x 2.0 x 3.4 cm hyperechoic endometrial mass and a small amount of endometrial fluid. Right ovary Not well visualized. Possible 2.7 x 1.6 x 1.4 cm cystic lesion in the right adnexa. Left ovary Not visualized.  No adnexal mass. Other findings Trace free fluid. IMPRESSION: 1. Markedly abnormal and thickened endometrium containing a focal 3.7 cm hyperechoic mass. In the setting of post-menopausal bleeding, endometrial sampling is indicated to exclude carcinoma. If results are benign, sonohysterogram should be considered for focal lesion work-up prior to hysteroscopy. (Ref: Radiological Reasoning: Algorithmic Workup of Abnormal Vaginal Bleeding with Endovaginal Sonography and Sonohysterography. AJR 2008; 035:K09-38) 2. Possible 2.7 cm cystic lesion in the right adnexa, not well seen on transvaginal images. This is almost certainly benign, but follow up ultrasound is recommended in 1 year according to the Society of Radiologists in [place] Statement (D Clovis Riley et al. Management of Asymptomatic Ovarian and Other Adnexal Cysts Imaged at [place]: Society of Radiologists in [place] Statement 2010. Radiology 256 (Sept 2010): 182-993.). Electronically Signed   By: Titus Dubin M.D.   On: 05/17/2017 11:39    Labs:  Basic Metabolic Panel:  Recent Labs Lab 05/31/17 2233 06/02/17 1729 06/03/17 0029 06/03/17 0509  NA 136 134*  --  136  K 3.9 3.4*  --  3.7  CL 98* 98*  --  101  CO2 26 24  --  25  GLUCOSE 86 87  --  114*  BUN 31* 34*  --  8  CREATININE 3.32* 3.31*  --  1.37*  CALCIUM 8.7* 8.5*  --  8.1*  MG  --   --  1.8  --   PHOS 3.9 3.8  --   --     CBC:  Recent Labs Lab 06/02/17 1730  WBC 9.9  HGB 8.9*  HCT 28.3*  MCV 105.6*  PLT 271    CBG:  Recent Labs Lab  06/01/17 1635 06/01/17 2120 06/02/17 0644 06/02/17 1223 06/02/17 2220  GLUCAP 105* 103* 99 107* 80    Brief HPI:   Shannon Hensleyis an 66 y.o.female with history of HTN, A fib-on pradaxa who was admitted on [facility] on 04/08/17 due to BLE cellulitis with difficulty walking. She was found to be septic with leucocytosis and hypotensive requiring fluid bolus and pressors.  Per records, she has not had any meds X 3 months and had two week history of DOE, BLE edema with fatigue and weakness. She was started on broad spectrum antibiotics and was noted to be volume overloaded with right pleural effusion. CRRT initiated and  hospital course significant for PEA arrest 7/4, ARDS, anuria requiring  IJ placement by vascular surgery as now HD dependent, shocked liver with abnormal LFTs, thrombocytopenia, ongoing issues with hypotension and tachycardia.    She developed BUE edema and dopplers done revealing "occlusive deep vein thrombosis in the left cephalic vein extending from the mid humeral region down towards the elbow".   Pressors weaned off and ProAmatine added to help with BP support. She continues on amiodarone and Cardizem for HR control and Dr. [name] recommended coumadin or low dose Eliquis due to A fib/stroke prophylaxis. 2 D echo revealed EF 25-30% with mild concentric hypertrophy, moderate AVR, moderate TVR and mild MVR.  Serologic work up negative and limited hope for renal recovery per nephrology--IV albumin being used during HD treatment prn.  Patient with diffuse weakness due to critical  illness myopathy with BUE and BLE weakness. Therapy working on pre-gait activity and CIR recommended due to substantial deficits in mobility and ability to carry out ADL tasks.     Hospital Course: Arthella Headings was admitted to rehab 05/07/2017 for inpatient therapies to consist of PT, ST and OT at least three hours five days a week. Past admission physiatrist, therapy team and rehab RN have worked together to provide  customized collaborative inpatient rehab.  She was noted have critical illness myopathy with BUE 4/5 and BLE 3-3+/5. Po intake has been poor and diet was liberalized to regular to allow for better food choices. Protein supplements were added to help with low calorie malnutrition and to help with healing of wounds.  CBC at admission revealed leucocytosis with WBC 13.4 and she was treated with Keflex X 7 days for pyuria.  She had two episodes of vaginal bleeding with dark clots and GYN @ [facility] recommended megace 40 mg bid to for bleeding as well as Pelvic/vaginal ultrasound which revealed mass. Outpatient exam with biopsy for work up was recommended by Dr. Roselie Awkward.   HD has been ongoing at end of day to help with tolerance of therapy. She was started on epogen for anemia of chronic disease.  Team has been providing ego support to help manage anxiety levels.  LLE ulceration with eschar on dorsum of foot and distal tibia were debrided at bedside on 8/6 and 8/13 by Dr. Posey Pronto and santyl with wet to dry dressings were initiated to help with wound healing.  Plastics was consulted for input and felt that back eschar on LLE was likely full thickness necrotic tissue and recommended surgical debridement with placement of Acell to expedite healing. Eliquis was discontinued and she was transitioned to IV heparin. She underwent I and D on  05/28/17. She developed issues with hypotension with HD therefore Cardizem was discontinued. She developed A fib with RVR and cardiology felt that recent surgery with inflammatory state was contributing to tachycardia. Low dose BB was added to help with heart rate. Post dialysis on 8/26, she developed A flutter as was transferred to telemetry for treatment and monitoring.     Rehab course: During patient's stay in rehab weekly team conferences were held to monitor patient's progress, set goals and discuss barriers to discharge. Patient required total assist with ADL tasks and mobility.   Cognition was grossly intact with MoCA 26/30 and mild decrease in recall and functional problem solving was related to anxiety and fatigue. She  had made improvement in activity tolerance, balance, postural control, as well as ability to compensate for deficits. She required mod to max assist for ADL tasks. She was able to transfer with min assist with mod cues and use of SB. She was able to complete semi-complex tasks at modified independent levels. Husband had been contacted to start family education but patient transferred off unit on 06/03/17 for management of medical issues.   Disposition:  Acute hospital   Diet: Regular.   Special Instructions: 1. VAC to remain in place till change by plastics.   Discharge Instructions    Ambulatory referral to Physical Medicine Rehab    Complete by:  As directed    1-2 weeks transitional care appt     Allergies as of 06/03/2017   No Known Allergies     Medication List    TAKE these medications   famotidine 20 MG tablet Commonly known as:  PEPCID Take 1 tablet (20 mg total) by mouth at  bedtime.     ASK your doctor about these medications   ALPRAZolam 0.25 MG tablet Commonly known as:  XANAX Take 0.25 mg by mouth at bedtime as needed for anxiety.   amiodarone 200 MG tablet Commonly known as:  PACERONE Take 1 tablet (200 mg total) by mouth 2 (two) times daily.   levothyroxine 50 MCG tablet Commonly known as:  SYNTHROID, LEVOTHROID Take 50 mcg by mouth daily before breakfast.   midodrine 5 MG tablet Commonly known as:  PROAMATINE Take 1 tablet (5 mg total) by mouth 3 (three) times daily with meals.   sertraline 25 MG tablet Commonly known as:  ZOLOFT Take 25 mg by mouth daily.            Discharge Care Instructions        Start     Ordered   05/18/17 0000  Ambulatory referral to Physical Medicine Rehab    Comments:  1-2 weeks transitional care appt   05/18/17 1203     Follow-up Information    [name], MD  Follow up.   Specialty:  Physical Medicine and Rehabilitation Contact information: [address] [place] [address] [phone]        Tracie Harrier, MD Follow up.   Specialty:  Internal Medicine Contact information: [place] [address] [phone]        Wallace Going, DO Follow up.   Specialty:  Plastic Surgery Contact information: [place] [address] [phone]           Signed: Bary Leriche 06/07/2017, 5:24 PM

## 2017-06-05 NOTE — Progress Notes (Signed)
Patient ID: [name], female   DOB: March 11, 1951, 66 y.o.   MRN: [id number]  [facility] Progress Note   Assessment/ Plan:   1. Chronic systolic congestive heart failure/dilated cardiomyopathy: Continues to have improved breathing and subjective assessment of edema with UF on dialysis.  2. ESRD: Continue hemodialysis on a Tuesday/Thursday/Saturday schedule, will order for next hemodialysis tomorrow. Appreciate input from VVS regarding plans and proposed timing of AVF surgery. Will confirm with OP HD unit regarding time/placement status.  3. Anemia: With evidence of iron deficiency, will prescribe intravenous iron and begin ESA 4. CKD-MBD: PTH level at goal for ESRD and phosphorus/calcium levels within acceptable limit. 5. Nutrition: With protein calorie malnutrition/chronic illness-continue nutritional supplementation with feeding supplement/MVI. 6. Atrial fibrillation with RVR: Improving rate control with amiodarone and metoprolol-the latter is limited in dosing by hypotension. 7. Hypotension: On midodrine, monitor with ultrafiltration and hemodialysis  Subjective:   Reports to be feeling better after nausea earlier during dialysis.    Objective:   BP 106/78   Pulse 65   Temp (!) 97.5 F (36.4 C) (Oral)   Resp 17   Ht 5\' 5"  (1.651 m)   Wt 75 kg (165 lb 5.5 oz)   SpO2 97%   BMI 27.51 kg/m   Physical Exam: EQA:[id number] resting in dialysis CVS: Pulse irregularly irregular and mild tachycardia (90-110), S1 and S2 with ejection systolic murmur Resp: Diminished breath sounds over bases, anteriorly clear to auscultation Abd: Soft, flat, nontender, bowel sounds normal Ext: Trace-1+ edema bilateral lower extremities with both legs in supportive soft boots  Labs: BMET  Recent Labs Lab 05/29/17 1614 05/29/17 1616 05/31/17 2233 06/02/17 1729 06/03/17 0509 06/04/17 1431  NA 136  --  136 134* 136 136  K 4.6  --  3.9 3.4* 3.7 4.3  CL 100*  --  98* 98* 101 101  CO2  23  --  26 24 25  21*  GLUCOSE 123*  --  86 87 114* 131*  BUN 46*  --  31* 34* 8 27*  CREATININE 4.31*  --  3.32* 3.31* 1.37* 2.98*  CALCIUM 8.9 8.7 8.7* 8.5* 8.1* 8.8*  PHOS 3.9  --  3.9 3.8  --  4.5   CBC  Recent Labs Lab 05/29/17 1616 05/31/17 2233 06/02/17 1730 06/05/17 0845  WBC 8.6 7.8 9.9 7.8  HGB 8.4* 8.3* 8.9* 9.0*  HCT 27.2* 26.3* 28.3* 29.4*  MCV 106.3* 106.5* 105.6* 108.5*  PLT 205 221 271 333   Medications:    . amiodarone  400 mg Oral BID  . famotidine  20 mg Oral QHS  . feeding supplement (PRO-STAT SUGAR FREE 64)  30 mL Oral BID  . Gerhardt's butt cream   Topical TID  . hydrocerin   Topical BID  . levothyroxine  100 mcg Oral QAC breakfast  . linaclotide  145 mcg Oral QAC breakfast  . megestrol  40 mg Oral BID  . metoprolol tartrate  25 mg Oral BID  . midodrine  10 mg Oral TID WC  . multivitamin  1 tablet Oral QHS  . polyethylene glycol  17 g Oral BID  . QUEtiapine  25 mg Oral QHS  . senna-docusate  2 tablet Oral QHS  . sertraline  25 mg Oral QHS  . Warfarin - Pharmacist Dosing Inpatient   Does not apply L8921   Elmarie Shiley, MD 06/05/2017, 9:21 AM

## 2017-06-05 NOTE — Progress Notes (Signed)
   inr has normalized. Will plan right arm avf when heart rate under better control. Tentatively schedule for tomorrow. Npo past midnight.   Fatih Stalvey C. [name], MD [facility] Office: [phone] Pager: [phone]

## 2017-06-05 NOTE — PMR Pre-admission (Signed)
PMR Admission Coordinator Pre-Admission Assessment  Patient: [name] is an 66 y.o., female MRN: [id number] DOB: Sep 25, 1951 Height: 5\' 5"  (165.1 cm) Weight: 72.5 kg (159 lb 13.3 oz)              Insurance Information HMO:     PPO:      PCP:      IPA:      80/20:      OTHER: Georgina Pillion PRIMARY: UHC Medicare       Policy#: [id number]      Subscriber: Self CM Name: Vevelyn Royals       Phone#: [phone]     Fax#: [phone] Pre-Cert#: [id number]      Employer: Retired  Benefits:  Phone #: Verified online     Name: [place]. Date: 02/06/17     Deduct: $0      Out of Pocket Max: $4000      Life Max: N/A CIR: $160 a day, days 1-10; $0 a day, days 11+      SNF: $0 a day, days 1-20; $50 a day, days 21-100 Outpatient: PT/OT     Co-Pay: $20 per visit  Home Health: 100%      Co-Pay: None DME: 80%     Co-Pay: 20% Providers: In-network  SECONDARY: None      Policy#:       Subscriber:  CM Name:       Phone#:      Fax#:  Pre-Cert#:       Employer:  Benefits:  Phone #:      Name:  Eff. Date:      Deduct:       Out of Pocket Max:       Life Max:  CIR:       SNF:  Outpatient:      Co-Pay:  Home Health:       Co-Pay:  DME:      Co-Pay:   Medicaid Application Date:       Case Manager:  Disability Application Date:       Case Worker:   Emergency Contact Information Contact Information    Name Relation Home Work Mobile   Pruiett,[name] Spouse [phone]       Current Medical History  Patient Admitting Diagnosis: Critical illness myopathy   History of Present Illness: [name] an 66 y.o.female with history of HTN, A fib-on pradaxa who was admitted on [facility] on 04/08/17 due to BLE cellulitis with difficulty walking. She was found to be septic with leucocytosis and hypotensive requiring fluid bolus and pressors. Per records, she has not had any meds X 3 months and had two week history of DOE, BLE edema with fatigue and weakness. She was started on broad spectrum antibiotics and was  noted to be volume overloaded with right pleural effusion. CRRT initiated and hospital course significant for PEA arrest 04/11/17, ARDS, anuria requiring IJ placement by vascular surgery as now HD dependent, shocked liver with abnormal LFTs, thrombocytopenia, ongoing issues with hypotension and tachycardia. She developed BUE edema and dopplers done revealing "occlusive deep vein thrombosis in the left cephalic vein extending from the mid humeral region down towards the elbow". Pressors weaned off and ProAmatine added to help with BP support. She continues on amiodarone and Cardizem for HR control and Dr. [name] recommended coumadin or low dose Eliquis due to A fib/stroke prophylaxis. 2 D echo revealed EF 25-30% with mild concentric hypertrophy, moderate AVR, moderate TVR and mild MVR. Serologic work  up negative and limited hope for renal recovery per nephrology--IV albumin being used during HD treatment prn. 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 Therapy working on pre-gait activity and CIR recommended due to substantial deficits in mobility and ability to carry out ADL tasks. She was admitted to rehab 05/07/2017 for inpatient therapies to consist of PT, ST and OT at least three hours five days a week. Past admission physiatrist, therapy team and rehab RN have worked together to provide customized collaborative inpatient rehab.  On 06/03/17 patient became tachycardic from dialysis with vitals being BP 113/ 97 HR sustaining at 140's to 160's and as a result was transferred back to acute for closer medical monitoring with telemetry and a cardiology consult.  06/06/18 patient medically stable after medication adjustments and as a result was re-admitted to complete her rehabilitation.         Past Medical History  Past Medical History:  Diagnosis Date  . A-fib ([place])    on pradaxa  . Chronic kidney disease   . Dysrhythmia   . Hypertension   .  Hypothyroidism   . Thyroid disease     Family History  family history includes AAA (abdominal aortic aneurysm) in her mother.  Prior Rehab/Hospitalizations:  Has the patient had major surgery during 100 days prior to admission? No  Current Medications   Current Facility-Administered Medications:  .  acetaminophen (TYLENOL) tablet 650 mg, 650 mg, Oral, Q6H PRN, Etta Quill, DO .  acetaminophen (TYLENOL) tablet 650 mg, 650 mg, Oral, Q4H PRN, Etta Quill, DO .  ALPRAZolam Duanne Moron) tablet 0.25 mg, 0.25 mg, Oral, TID PRN, Rama, Venetia Maxon, MD, 0.25 mg at 06/04/17 2149 .  amiodarone (PACERONE) tablet 400 mg, 400 mg, Oral, BID, Radford Pax, Traci R, MD, 400 mg at 06/06/17 0817 .  bisacodyl (DULCOLAX) suppository 10 mg, 10 mg, Rectal, Daily PRN, Etta Quill, DO .  [START ON 06/07/2017] Darbepoetin Alfa (ARANESP) injection 60 mcg, 60 mcg, Intravenous, Q Thu-HD, Elmarie Shiley, MD .  famotidine (PEPCID) tablet 20 mg, 20 mg, Oral, QHS, Gardner, Jared M, DO, 20 mg at 06/05/17 2157 .  feeding supplement (PRO-STAT SUGAR FREE 64) liquid 30 mL, 30 mL, Oral, BID, Rama, Christina P, MD .  ferric gluconate (NULECIT) 125 mg in sodium chloride 0.9 % 100 mL IVPB, 125 mg, Intravenous, Q T,Th,Sa-HD, Elmarie Shiley, MD, Stopped at 06/05/17 1545 .  Gerhardt's butt cream, , Topical, TID, Rama, Christina P, MD .  hydrocerin (EUCERIN) cream, , Topical, BID, Rama, Christina P, MD .  ipratropium-albuterol (DUONEB) 0.5-2.5 (3) MG/3ML nebulizer solution 3 mL, 3 mL, Nebulization, Q4H PRN, Alcario Drought, Jared M, DO .  levothyroxine (SYNTHROID, LEVOTHROID) tablet 100 mcg, 100 mcg, Oral, QAC breakfast, Rama, Venetia Maxon, MD, 100 mcg at 06/06/17 4196 .  linaclotide (LINZESS) capsule 145 mcg, 145 mcg, Oral, QAC breakfast, Rama, Venetia Maxon, MD, 145 mcg at 06/06/17 [phone] .  megestrol (MEGACE) tablet 40 mg, 40 mg, Oral, BID, Rama, Venetia Maxon, MD, 40 mg at 06/06/17 0818 .  metoprolol tartrate (LOPRESSOR) tablet 50 mg, 50 mg, Oral,  BID, Lelon Perla, MD, 50 mg at 06/06/17 0817 .  midodrine (PROAMATINE) tablet 10 mg, 10 mg, Oral, TID WC, Rama, Venetia Maxon, MD, 10 mg at 06/06/17 1215 .  multivitamin (RENA-VIT) tablet 1 tablet, 1 tablet, Oral, QHS, Etta Quill, DO, 1 tablet at 06/05/17 2157 .  ondansetron (ZOFRAN) injection 4 mg, 4 mg, Intravenous, Q6H PRN, Alcario Drought,  Toy Care, DO .  ondansetron [facility]) injection 4 mg, 4 mg, Intravenous, Q6H PRN, Alcario Drought, Jared M, DO .  polyethylene glycol (MIRALAX / GLYCOLAX) packet 17 g, 17 g, Oral, BID, Rama, Venetia Maxon, MD, 17 g at 06/06/17 1215 .  QUEtiapine (SEROQUEL) tablet 25 mg, 25 mg, Oral, QHS, Gardner, Jared M, DO, 25 mg at 06/05/17 2157 .  senna-docusate (Senokot-S) tablet 2 tablet, 2 tablet, Oral, QHS, Etta Quill, DO, 2 tablet at 06/05/17 2157 .  sertraline (ZOLOFT) tablet 25 mg, 25 mg, Oral, QHS, Gardner, Jared M, DO, 25 mg at 06/05/17 2157 .  sucroferric oxyhydroxide (VELPHORO) chewable tablet 500 mg, 500 mg, Oral, TID WC, Elmarie Shiley, MD, 500 mg at 06/06/17 1215 .  Warfarin - Pharmacist Dosing Inpatient, , Does not apply, q1800, Laren Everts, RPH, Stopped at 06/04/17 1623  Patients Current Diet: Diet Heart Room service appropriate? Yes; Fluid consistency: Thin  Precautions / Restrictions Precautions Precautions: Fall Precaution Comments: R IJ perm cath; wound vac LLE Restrictions Weight Bearing Restrictions: No   Has the patient had 2 or more falls or a fall with injury in the past year?No  Prior Activity Level Community (5-7x/wk): Prior to admission patient was fully independent and active.  She enjoyed shopping, the beach, and visiting friends and family.   Home Assistive Devices / Equipment Home Assistive Devices/Equipment: None Home Equipment: None  Prior Device Use: Indicate devices/aids used by the patient prior to current illness, exacerbation or injury? None of the above  Prior Functional Level Prior Function Level of Independence:  Independent Comments: Independent with ADLs, enjoys household tasks  Self Care: Did the patient need help bathing, dressing, using the toilet or eating? Independent  Indoor Mobility: Did the patient need assistance with walking from room to room (with or without device)? Independent  Stairs: Did the patient need assistance with internal or external stairs (with or without device)? Independent  Functional Cognition: Did the patient need help planning regular tasks such as shopping or remembering to take medications? Independent  Current Functional Level Cognition  Overall Cognitive Status: Within Functional Limits for tasks assessed Orientation Level: Oriented X4    Extremity Assessment (includes Sensation/Coordination)  Upper Extremity Assessment: RUE deficits/detail, LUE deficits/detail RUE Deficits / Details: R shoulder weakness. Able to complete FF to @ 60; painfree ROM; capular weakness. ? RTC insufficiency RUE Coordination: decreased gross motor LUE Deficits / Details: generalized weakness  Lower Extremity Assessment: Defer to PT evaluation RLE Deficits / Details: AAROM WFL, strength hip flexion 2-/5, knee extension 3-/5, ankle DF 3+/5 LLE Deficits / Details: AAROM WFL, strength hip flexion 1+/5, knee extension 2+/5, ankle DF 3/5    ADLs  Overall ADL's : Needs assistance/impaired Eating/Feeding: Modified independent, Sitting Grooming: Sitting, Wash/dry hands, Wash/dry face, Oral care, Set up Grooming Details (indicate cue type and reason): Pt tolerated sitting EOB for 25 minutes today with supervision and set up to wash hands/face and brush teeth. Pt demonstrating continued improvement in overall strength and activity tolerance for self care tasks.  Upper Body Bathing: Minimal assistance, Sitting Lower Body Bathing: Bed level, Moderate assistance Upper Body Dressing : Minimal assistance, Sitting Lower Body Dressing: Moderate assistance, Sitting/lateral leans Functional  mobility during ADLs: +2 for physical assistance, Maximal assistance    Mobility  Overal bed mobility: Needs Assistance Bed Mobility: Rolling, Sidelying to Sit Rolling: Min assist Sidelying to sit: Mod assist General bed mobility comments: to roll assist to bend L knee, then pt able to reach for railing, assist  L LE off bed and to lift trunk upright    Transfers  Overall transfer level: Needs assistance Equipment used: 2 person hand held assist Transfers: Sit to/from Stand, Google Transfers Sit to Stand: Mod assist, +2 physical assistance, Max assist Squat pivot transfers: Max assist  Lateral/Scoot Transfers: Mod assist General transfer comment: +2 for initial sit to stand, then stood with +1 max A and second person for hygiene; then squat pivot to recliner max A utilizing pad under pt    Ambulation / Gait / Stairs / Wheelchair Mobility       Posture / Balance Dynamic Sitting Balance Sitting balance - Comments: EOB static sitting unaided Balance Overall balance assessment: Needs assistance Sitting-balance support: Single extremity supported, Feet supported Sitting balance-Leahy Scale: Good Sitting balance - Comments: EOB static sitting unaided Standing balance-Leahy Scale: Poor Standing balance comment: +1 mod A for support to stand for hygiene with pt able to take weight on legs and holding on for balance    Special needs/care consideration BiPAP/CPAP: No CPM: No Continuous Drip IV: No Dialysis: Yes, new since this hospital course        Days: T,Th,Sat at [facility] in [place] 1st sitting. Coordination contact: Elvera Bicker [phone] Life Vest: No Oxygen: No Special Bed: No Trach Size: No Wound Vac (area): Yes, per Plastic Surgeon note from 8/28: Left foot/lower leg VAC functioning well. Dressing removed and Acell partially incorporated. No signs of infection or leg edema. VAC dressing was reapplied over Adaptic layer and therapy resumed at 125 mm Hg continuous therapy.  Will need another VAC dressing change next week.         Skin: Dry and flaky; Cellulitis and abrasion right foot and ankle, Bruising to back and sacrum with moisture associated skin damage to sacrum and coccyx                                Bowel mgmt: Continent, 06/03/17  Bladder mgmt: Continent  Diabetic mgmt: No     Previous Home Environment Living Arrangements: Spouse/significant other  Lives With: Spouse Available Help at Discharge: Family, Available 24 hours/day Type of Home: House Home Layout: One level Home Access: Stairs to enter Entrance Stairs-Rails: Right, Left, Can reach both Entrance Stairs-Number of Steps: 4 Bathroom Shower/Tub: Tub/shower unit, Multimedia programmer: Associate Professor Accessibility: Yes How Accessible: Accessible via walker [place]: No Additional Comments: N/A  Discharge Living Setting Plans for Discharge Living Setting: Patient's home, Lives with (comment) (Spouse) Type of Home at Discharge: House Discharge Home Layout: One level Discharge Home Access: Stairs to enter Entrance Stairs-Rails: Right, Left, Can reach both Entrance Stairs-Number of Steps: 3-4 Discharge Bathroom Shower/Tub: Tub/shower unit, Walk-in shower Discharge Bathroom Toilet: Standard Discharge Bathroom Accessibility: Yes How Accessible: Accessible via walker Does the patient have any problems obtaining your medications?: No  Social/Family/Support Systems Patient Roles: Spouse, Parent Contact Information: Spouse Raney Antwine [phone] Anticipated Caregiver: Spouse  Anticipated Caregiver's Contact Information: see above Ability/Limitations of Caregiver: he works part time but will arrange care/provide 24/7 Caregiver Availability: 24/7 Discharge Plan Discussed with Primary Caregiver: Yes Is Caregiver In Agreement with Plan?: Yes Does Caregiver/Family have Issues with Lodging/Transportation while Pt is in Rehab?: No  Goals/Additional  Needs Patient/Family Goal for Rehab: PT/OT Min A Expected length of stay: ~7 days  Cultural Considerations: Christian  Dietary Needs: Renal diet restrictions  Equipment Needs: TBD Special Service Needs: New HD patient and will  need education on process for transition to HD outpatient schedule   Additional Information: Patient and spouse are from [facility] and originally from [facility] Pt/Family Agrees to Admission and willing to participate: Yes Program Orientation Provided & Reviewed with Pt/Caregiver Including Roles  & Responsibilities: Yes Additional Information Needs: Since this most recent acute admission patient with cardiology following and plans for hopeful avf placement 8/29 Information Needs to be Provided By: Medical team   Barriers to Discharge: Hemodialysis   Decrease burden of Care through IP rehab admission: No  Possible need for SNF placement upon discharge: No  Patient Condition: Patient known to our service from a previous admission. She was admitted to rehab 05/07/2017 for inpatient therapies to consist of PT, ST and OT at least three hours five days a week. Past admission physiatrist, therapy team and rehab RN have worked together to provide customized collaborative inpatient rehab. However, due to tachycardia that developed from dialysis she was admitted back to acute for close medical management with cardiology and telemetry. Following medication changes she is again medically stable with continued documented therapy needs.  As a result, she will be re-admitted today to complete her rehabilitation course with coordinated care and three hours of skilled therapy 5 out of 7 days of the week.    Preadmission Screen Completed By:  Gunnar Fusi, 06/06/2017 2:45 PM ______________________________________________________________________   Discussed status with Dr. Posey Pronto on 06/06/17 at 1445 and received telephone approval for admission today.  Admission Coordinator:  Gunnar Fusi, time  1445/Date 06/06/17

## 2017-06-06 ENCOUNTER — Encounter (HOSPITAL_COMMUNITY)
Admission: RE | Disposition: A | Discharge status: Home / Self Care | Payer: Self-pay | Source: Ambulatory Visit | Attending: Internal Medicine

## 2017-06-06 ENCOUNTER — Observation Stay (HOSPITAL_COMMUNITY): Payer: Medicare Other | Admitting: Anesthesiology

## 2017-06-06 ENCOUNTER — Encounter (HOSPITAL_COMMUNITY): Payer: Self-pay

## 2017-06-06 ENCOUNTER — Inpatient Hospital Stay (HOSPITAL_COMMUNITY)
Admission: RE | Admit: 2017-06-06 | Discharge: 2017-06-19 | DRG: 091 | Disposition: A | Discharge status: Home Health | Payer: Medicare Other | Source: Intra-hospital | Attending: Physical Medicine & Rehabilitation | Admitting: Physical Medicine & Rehabilitation

## 2017-06-06 DIAGNOSIS — E039 Hypothyroidism, unspecified: Secondary | ICD-10-CM | POA: Diagnosis present

## 2017-06-06 DIAGNOSIS — D72829 Elevated white blood cell count, unspecified: Secondary | ICD-10-CM

## 2017-06-06 DIAGNOSIS — Z992 Dependence on renal dialysis: Secondary | ICD-10-CM | POA: Diagnosis not present

## 2017-06-06 DIAGNOSIS — I5022 Chronic systolic (congestive) heart failure: Secondary | ICD-10-CM | POA: Diagnosis present

## 2017-06-06 DIAGNOSIS — I420 Dilated cardiomyopathy: Secondary | ICD-10-CM | POA: Diagnosis present

## 2017-06-06 DIAGNOSIS — L98429 Non-pressure chronic ulcer of back with unspecified severity: Secondary | ICD-10-CM | POA: Diagnosis not present

## 2017-06-06 DIAGNOSIS — F329 Major depressive disorder, single episode, unspecified: Secondary | ICD-10-CM | POA: Diagnosis present

## 2017-06-06 DIAGNOSIS — F419 Anxiety disorder, unspecified: Secondary | ICD-10-CM | POA: Diagnosis present

## 2017-06-06 DIAGNOSIS — K5901 Slow transit constipation: Secondary | ICD-10-CM | POA: Diagnosis not present

## 2017-06-06 DIAGNOSIS — I959 Hypotension, unspecified: Secondary | ICD-10-CM | POA: Diagnosis not present

## 2017-06-06 DIAGNOSIS — B379 Candidiasis, unspecified: Secondary | ICD-10-CM | POA: Diagnosis not present

## 2017-06-06 DIAGNOSIS — W458XXA Other foreign body or object entering through skin, initial encounter: Secondary | ICD-10-CM | POA: Diagnosis present

## 2017-06-06 DIAGNOSIS — N186 End stage renal disease: Secondary | ICD-10-CM | POA: Diagnosis present

## 2017-06-06 DIAGNOSIS — I82409 Acute embolism and thrombosis of unspecified deep veins of unspecified lower extremity: Secondary | ICD-10-CM | POA: Diagnosis not present

## 2017-06-06 DIAGNOSIS — L97929 Non-pressure chronic ulcer of unspecified part of left lower leg with unspecified severity: Secondary | ICD-10-CM | POA: Diagnosis present

## 2017-06-06 DIAGNOSIS — D259 Leiomyoma of uterus, unspecified: Secondary | ICD-10-CM | POA: Diagnosis present

## 2017-06-06 DIAGNOSIS — S90859A Superficial foreign body, unspecified foot, initial encounter: Secondary | ICD-10-CM | POA: Diagnosis present

## 2017-06-06 DIAGNOSIS — Z79899 Other long term (current) drug therapy: Secondary | ICD-10-CM

## 2017-06-06 DIAGNOSIS — N184 Chronic kidney disease, stage 4 (severe): Secondary | ICD-10-CM | POA: Diagnosis not present

## 2017-06-06 DIAGNOSIS — Z7901 Long term (current) use of anticoagulants: Secondary | ICD-10-CM | POA: Diagnosis not present

## 2017-06-06 DIAGNOSIS — D62 Acute posthemorrhagic anemia: Secondary | ICD-10-CM | POA: Diagnosis present

## 2017-06-06 DIAGNOSIS — E876 Hypokalemia: Secondary | ICD-10-CM | POA: Diagnosis present

## 2017-06-06 DIAGNOSIS — K5900 Constipation, unspecified: Secondary | ICD-10-CM | POA: Diagnosis present

## 2017-06-06 DIAGNOSIS — F411 Generalized anxiety disorder: Secondary | ICD-10-CM | POA: Diagnosis not present

## 2017-06-06 DIAGNOSIS — I481 Persistent atrial fibrillation: Secondary | ICD-10-CM | POA: Diagnosis present

## 2017-06-06 DIAGNOSIS — D709 Neutropenia, unspecified: Secondary | ICD-10-CM | POA: Diagnosis not present

## 2017-06-06 DIAGNOSIS — G7281 Critical illness myopathy: Secondary | ICD-10-CM

## 2017-06-06 DIAGNOSIS — I953 Hypotension of hemodialysis: Secondary | ICD-10-CM | POA: Diagnosis not present

## 2017-06-06 DIAGNOSIS — L97922 Non-pressure chronic ulcer of unspecified part of left lower leg with fat layer exposed: Secondary | ICD-10-CM | POA: Diagnosis not present

## 2017-06-06 DIAGNOSIS — D539 Nutritional anemia, unspecified: Secondary | ICD-10-CM | POA: Diagnosis not present

## 2017-06-06 DIAGNOSIS — D72819 Decreased white blood cell count, unspecified: Secondary | ICD-10-CM | POA: Diagnosis not present

## 2017-06-06 DIAGNOSIS — N189 Chronic kidney disease, unspecified: Secondary | ICD-10-CM | POA: Diagnosis not present

## 2017-06-06 DIAGNOSIS — D638 Anemia in other chronic diseases classified elsewhere: Secondary | ICD-10-CM | POA: Diagnosis present

## 2017-06-06 DIAGNOSIS — I4891 Unspecified atrial fibrillation: Secondary | ICD-10-CM | POA: Diagnosis not present

## 2017-06-06 DIAGNOSIS — S90852S Superficial foreign body, left foot, sequela: Secondary | ICD-10-CM | POA: Diagnosis not present

## 2017-06-06 DIAGNOSIS — D708 Other neutropenia: Secondary | ICD-10-CM | POA: Diagnosis not present

## 2017-06-06 DIAGNOSIS — F331 Major depressive disorder, recurrent, moderate: Secondary | ICD-10-CM | POA: Diagnosis not present

## 2017-06-06 DIAGNOSIS — G7289 Other specified myopathies: Secondary | ICD-10-CM | POA: Diagnosis not present

## 2017-06-06 DIAGNOSIS — E46 Unspecified protein-calorie malnutrition: Secondary | ICD-10-CM | POA: Diagnosis not present

## 2017-06-06 DIAGNOSIS — I132 Hypertensive heart and chronic kidney disease with heart failure and with stage 5 chronic kidney disease, or end stage renal disease: Secondary | ICD-10-CM | POA: Diagnosis present

## 2017-06-06 DIAGNOSIS — L03116 Cellulitis of left lower limb: Secondary | ICD-10-CM | POA: Diagnosis not present

## 2017-06-06 DIAGNOSIS — L03115 Cellulitis of right lower limb: Secondary | ICD-10-CM | POA: Diagnosis not present

## 2017-06-06 LAB — PROTIME-INR
INR: 2.88
Prothrombin Time: 29.9 seconds — ABNORMAL HIGH (ref 11.4–15.2)

## 2017-06-06 SURGERY — ARTERIOVENOUS (AV) FISTULA CREATION
Anesthesia: Choice | Laterality: Right

## 2017-06-06 MED ORDER — RENA-VITE PO TABS
1.0000 | ORAL_TABLET | Freq: Every day | ORAL | Status: DC
  Administered 2017-06-06 – 2017-06-18 (×13): 1 via ORAL
  Filled 2017-06-06 (×13): qty 1

## 2017-06-06 MED ORDER — IPRATROPIUM-ALBUTEROL 0.5-2.5 (3) MG/3ML IN SOLN: 3.0000 mL | RESPIRATORY_TRACT | Status: DC | PRN

## 2017-06-06 MED ORDER — SERTRALINE HCL 50 MG PO TABS
25.0000 mg | ORAL_TABLET | Freq: Every day | ORAL | Status: DC
  Administered 2017-06-06 – 2017-06-18 (×13): 25 mg via ORAL
  Filled 2017-06-06 (×13): qty 1

## 2017-06-06 MED ORDER — BISACODYL 10 MG RE SUPP: 10.0000 mg | Freq: Every day | RECTAL | Status: DC | PRN

## 2017-06-06 MED ORDER — FAMOTIDINE 20 MG PO TABS
20.0000 mg | ORAL_TABLET | Freq: Every day | ORAL | Status: DC
  Administered 2017-06-06 – 2017-06-18 (×13): 20 mg via ORAL
  Filled 2017-06-06 (×13): qty 1

## 2017-06-06 MED ORDER — ALUM & MAG HYDROXIDE-SIMETH 200-200-20 MG/5ML PO SUSP: 30.0000 mL | ORAL | Status: DC | PRN

## 2017-06-06 MED ORDER — SODIUM CHLORIDE 0.9 % IV SOLN
125.0000 mg | INTRAVENOUS | Status: DC
  Administered 2017-06-07 – 2017-06-19 (×6): 125 mg via INTRAVENOUS
  Filled 2017-06-06 (×12): qty 10

## 2017-06-06 MED ORDER — AMIODARONE HCL 200 MG PO TABS
400.0000 mg | ORAL_TABLET | Freq: Two times a day (BID) | ORAL | Status: DC
  Administered 2017-06-06 – 2017-06-19 (×24): 400 mg via ORAL
  Filled 2017-06-06 (×26): qty 2

## 2017-06-06 MED ORDER — PROCHLORPERAZINE 25 MG RE SUPP: 12.5000 mg | Freq: Four times a day (QID) | RECTAL | Status: DC | PRN

## 2017-06-06 MED ORDER — SUCROFERRIC OXYHYDROXIDE 500 MG PO CHEW
500.0000 mg | CHEWABLE_TABLET | Freq: Three times a day (TID) | ORAL | Status: DC
  Administered 2017-06-06: 500 mg via ORAL
  Filled 2017-06-06 (×2): qty 1

## 2017-06-06 MED ORDER — ACETAMINOPHEN 325 MG PO TABS: 325.0000 mg | ORAL_TABLET | ORAL | Status: DC | PRN

## 2017-06-06 MED ORDER — TRAZODONE HCL 50 MG PO TABS: 25.0000 mg | ORAL_TABLET | Freq: Every evening | ORAL | Status: DC | PRN

## 2017-06-06 MED ORDER — SUCROFERRIC OXYHYDROXIDE 500 MG PO CHEW
500.0000 mg | CHEWABLE_TABLET | Freq: Three times a day (TID) | ORAL | Status: DC
  Administered 2017-06-07 – 2017-06-17 (×18): 500 mg via ORAL
  Filled 2017-06-06 (×39): qty 1

## 2017-06-06 MED ORDER — MIDODRINE HCL 5 MG PO TABS
10.0000 mg | ORAL_TABLET | Freq: Three times a day (TID) | ORAL | Status: DC
Start: 2017-06-07 — End: 2017-06-19
  Administered 2017-06-07 – 2017-06-19 (×34): 10 mg via ORAL
  Filled 2017-06-06 (×32): qty 2

## 2017-06-06 MED ORDER — DIPHENHYDRAMINE HCL 12.5 MG/5ML PO ELIX: 12.5000 mg | ORAL_SOLUTION | Freq: Four times a day (QID) | ORAL | Status: DC | PRN

## 2017-06-06 MED ORDER — WARFARIN - PHARMACIST DOSING INPATIENT: Freq: Every day | Status: DC

## 2017-06-06 MED ORDER — SENNOSIDES-DOCUSATE SODIUM 8.6-50 MG PO TABS
2.0000 | ORAL_TABLET | Freq: Two times a day (BID) | ORAL | Status: DC
  Administered 2017-06-06 – 2017-06-07 (×2): 2 via ORAL
  Filled 2017-06-06 (×2): qty 2

## 2017-06-06 MED ORDER — PROPOFOL 10 MG/ML IV BOLUS
INTRAVENOUS | Status: AC
  Filled 2017-06-06: qty 20

## 2017-06-06 MED ORDER — METOPROLOL TARTRATE 50 MG PO TABS
50.0000 mg | ORAL_TABLET | Freq: Two times a day (BID) | ORAL | Status: DC
  Administered 2017-06-06 – 2017-06-18 (×19): 50 mg via ORAL
  Filled 2017-06-06 (×25): qty 1

## 2017-06-06 MED ORDER — PROCHLORPERAZINE MALEATE 5 MG PO TABS: 5.0000 mg | ORAL_TABLET | Freq: Four times a day (QID) | ORAL | Status: DC | PRN

## 2017-06-06 MED ORDER — LINACLOTIDE 145 MCG PO CAPS
145.0000 ug | ORAL_CAPSULE | Freq: Every day | ORAL | Status: DC
  Administered 2017-06-07: 145 ug via ORAL
  Filled 2017-06-06: qty 1

## 2017-06-06 MED ORDER — LIDOCAINE-EPINEPHRINE (PF) 1 %-1:200000 IJ SOLN
INTRAMUSCULAR | Status: AC
  Filled 2017-06-06: qty 30

## 2017-06-06 MED ORDER — GERHARDT'S BUTT CREAM
TOPICAL_CREAM | Freq: Three times a day (TID) | CUTANEOUS | Status: DC
Start: 2017-06-06 — End: 2017-06-13
  Administered 2017-06-06 – 2017-06-13 (×18): via TOPICAL
  Filled 2017-06-06: qty 1

## 2017-06-06 MED ORDER — ALPRAZOLAM 0.25 MG PO TABS: 0.2500 mg | ORAL_TABLET | Freq: Three times a day (TID) | ORAL | Status: DC | PRN

## 2017-06-06 MED ORDER — PROCHLORPERAZINE EDISYLATE 5 MG/ML IJ SOLN: 5.0000 mg | Freq: Four times a day (QID) | INTRAMUSCULAR | Status: DC | PRN

## 2017-06-06 MED ORDER — PRO-STAT SUGAR FREE PO LIQD
30.0000 mL | Freq: Two times a day (BID) | ORAL | Status: DC
  Filled 2017-06-06 (×3): qty 30

## 2017-06-06 MED ORDER — DARBEPOETIN ALFA 60 MCG/0.3ML IJ SOSY
60.0000 ug | PREFILLED_SYRINGE | INTRAMUSCULAR | Status: DC
  Administered 2017-06-07 – 2017-06-14 (×2): 60 ug via INTRAVENOUS
  Filled 2017-06-06 (×2): qty 0.3

## 2017-06-06 MED ORDER — LEVOTHYROXINE SODIUM 100 MCG PO TABS
100.0000 ug | ORAL_TABLET | Freq: Every day | ORAL | Status: DC
  Administered 2017-06-07 – 2017-06-18 (×12): 100 ug via ORAL
  Filled 2017-06-06 (×15): qty 1

## 2017-06-06 MED ORDER — HYDROCERIN EX CREA
TOPICAL_CREAM | Freq: Two times a day (BID) | CUTANEOUS | Status: DC
  Administered 2017-06-06 – 2017-06-11 (×10): via TOPICAL
  Administered 2017-06-11: 1 via TOPICAL
  Administered 2017-06-12 – 2017-06-15 (×6): via TOPICAL
  Administered 2017-06-15: 1 via TOPICAL
  Administered 2017-06-16 – 2017-06-19 (×7): via TOPICAL
  Filled 2017-06-06: qty 113

## 2017-06-06 MED ORDER — GUAIFENESIN-DM 100-10 MG/5ML PO SYRP: 5.0000 mL | ORAL_SOLUTION | Freq: Four times a day (QID) | ORAL | Status: DC | PRN

## 2017-06-06 MED ORDER — QUETIAPINE FUMARATE 25 MG PO TABS
25.0000 mg | ORAL_TABLET | Freq: Every day | ORAL | Status: DC
  Administered 2017-06-06 – 2017-06-18 (×13): 25 mg via ORAL
  Filled 2017-06-06 (×13): qty 1

## 2017-06-06 MED ORDER — MEGESTROL ACETATE 20 MG PO TABS
40.0000 mg | ORAL_TABLET | Freq: Two times a day (BID) | ORAL | Status: DC
  Administered 2017-06-06 – 2017-06-18 (×24): 40 mg via ORAL
  Filled 2017-06-06 (×25): qty 2

## 2017-06-06 MED ORDER — POLYETHYLENE GLYCOL 3350 17 G PO PACK
17.0000 g | PACK | Freq: Two times a day (BID) | ORAL | Status: DC
  Administered 2017-06-06: 17 g via ORAL
  Filled 2017-06-06 (×2): qty 1

## 2017-06-06 NOTE — Progress Notes (Addendum)
ANTICOAGULATION CONSULT NOTE - Follow Up Consult  Pharmacy Consult for Coumadin Indication: atrial fibrillation  No Known Allergies  Patient Measurements: Height: 5\' 5"  (165.1 cm) Weight: 159 lb 13.3 oz (72.5 kg) IBW/kg (Calculated) : 57  Vital Signs: Temp: 98.5 F (36.9 C) (08/29 0526) Temp Source: Oral (08/29 0526) BP: 110/82 (08/28 2355) Pulse Rate: 94 (08/28 2355)  Labs:  Recent Labs  06/04/17 1431  06/05/17 0500 06/05/17 0845 06/05/17 1425 06/06/17 0311  HGB  --   --   --  9.0*  --   --   HCT  --   --   --  29.4*  --   --   PLT  --   --   --  333  --   --   LABPROT  --   < > 16.3*  --  36.2* 29.9*  INR  --   < > 1.30  --  3.67 2.88  CREATININE 2.98*  --   --  3.56*  --   --   < > = values in this interval not displayed.  Estimated Creatinine Clearance: 15.7 mL/min (A) (by C-G formula based on SCr of 3.56 mg/dL (H)).  Assessment: 66 yo female with Afib on apixaban PTA, ESRD in need of permanent dialysis.   Holding inpatient warfarin until INR < 1.8 for AV fistula placement.    INR today 2.88.  Plan:  Hold coumadin tonight INR in AM F/u surgery plans   Diana L. Kyung Rudd, PharmD, [place] PGY1 Pharmacy Resident Pager: [phone]

## 2017-06-06 NOTE — IPOC Note (Signed)
Overall Plan of Care [facility]) Patient Details Name: [name] MRN: [id number] DOB: [birth year]  Admitting Diagnosis: Cervical at [facility] Problems: Active Problems:   Critical illness myopathy   Slow transit constipation   Acute blood loss anemia   Splinter of foot without major open wound or infection   Dependence on renal dialysis ([place])   Chronic kidney disease (CKD), stage IV (severe) (HCC)   Atrial fibrillation with rapid ventricular response (HCC)   Chronic anticoagulation     Functional Problem List: Nursing Bowel, Edema, Safety, Endurance, Skin Integrity  PT Balance, Edema, Endurance, Motor, Safety, Skin Integrity  OT Balance, Endurance, Motor, Pain, Safety, Skin Integrity  SLP    TR         Basic ADL's: OT Eating, Grooming, Bathing, Dressing, Toileting     Advanced  ADL's: OT       Transfers: PT Bed Mobility, Bed to Chair, Car, Manufacturing systems engineer, Metallurgist: PT Ambulation, Emergency planning/management officer, Stairs     Additional Impairments: OT Fuctional Use of Upper Extremity  SLP        TR      Anticipated Outcomes Item Anticipated Outcome  Self Feeding Supevision/setup  Swallowing      Basic self-care  Min assist  Toileting  Mod assist   Bathroom Transfers Min assist  Bowel/Bladder  patient will be continent of bowel during admission  Transfers  supervision basic, min car  Locomotion  mod I w/c x 150', ambulation and steps TBD  Communication     Cognition     Pain  patient will be pain free or pain less than 3 during admission  Safety/Judgment  patient will be free from falls and adhere to safety plan   Therapy Plan: PT Intensity: Minimum of 1-2 x/day ,45 to 90 minutes PT Frequency: 5 out of 7 days PT Duration Estimated Length of Stay: 12-14 days OT Intensity: Minimum of 1-2 x/day, 45 to 90 minutes OT Frequency: 5 out of 7 days OT Duration/Estimated Length of Stay: 7-10 days      Team Interventions: Nursing  Interventions Skin Care/Wound Management  PT interventions Ambulation/gait training, Training and development officer, Cognitive remediation/compensation, Discharge planning, Community reintegration, DME/adaptive equipment instruction, Functional mobility training, Patient/family education, Pain management, Neuromuscular re-education, Psychosocial support, Splinting/orthotics, Therapeutic Exercise, Stair training, Therapeutic Activities, Skin care/wound management, UE/LE Strength taining/ROM, UE/LE Coordination activities, Wheelchair propulsion/positioning  OT Interventions Training and development officer, Academic librarian, Discharge planning, Disease mangement/prevention, Engineer, drilling, Functional mobility training, Neuromuscular re-education, Pain management, Patient/family education, Psychosocial support, Self Care/advanced ADL retraining, Skin care/wound managment, Therapeutic Activities, Therapeutic Exercise, UE/LE Strength taining/ROM, UE/LE Coordination activities, Wheelchair propulsion/positioning  SLP Interventions    TR Interventions    SW/CM Interventions Discharge Planning, Psychosocial Support, Patient/Family Education   Barriers to Discharge MD  Medical stability and wound care  Nursing      PT Wound Care, Hemodialysis, Medical stability needs to be able to transfer for HD  OT Hemodialysis, Inaccessible home environment, Wound Care    SLP      SW       Team Discharge Planning: Destination: PT-Home ,OT- Home , SLP-  Projected Follow-up: PT-Home health PT, OT-  Home health OT, 24 hour supervision/assistance, SLP-  Projected Equipment Needs: PT-To be determined, OT- 3 in 1 bedside comode, Tub/shower bench (drop arm BSC), SLP-  Equipment Details: PT- , OT-  Patient/family involved in discharge planning: PT- Patient,  OT-Patient, SLP-   MD ELOS: 8-12 days. Medical Rehab  Prognosis:  Good Assessment: 66 y.o.female with history of HTN, A fib-on pradaxa who was  admitted on [facility] on 04/08/17 due to BLE cellulitis with difficulty walking. She was found to be septic with leucocytosis, hypotensive with two week history of DOE and BLE edema with cellulitis.  She was started on broad spectrum antibiotics and was noted to be volume overloaded with right pleural effusion requiring CRRT. Hospital course significant for PEA arrest 7/4, ARDS, anuria requiring IJ placement by vascular surgery as now HD dependent, shocked liver with abnormal LFTs, thrombocytopenia, ongoing issues with hypotension and tachycardia. She developed BUE edema due to "occlusive deep vein thrombosis in the left cephalic vein extending from the mid humeral region down towards the elbow". She has had issues with A fib with RVR and placed on amiodarone and Cardizem for HR control and Dr. [name] recommended coumadin or low dose Eliquis due to A fib/stroke prophylaxis.  Serologic work up negative and she was HD dependent.  She was noted to have diffuse weakness due to critical illness myopathy with BUE and BLE weakness.  Will set goals for Min A for most tasks with PT/OT.     See Team Conference Notes for weekly updates to the plan of care

## 2017-06-06 NOTE — Anesthesia Preprocedure Evaluation (Deleted)
Anesthesia Evaluation    Reviewed: Allergy & Precautions, Patient's Chart, lab work & pertinent test results  History of Anesthesia Complications Negative for: history of anesthetic complications  Airway        Dental   Pulmonary neg pulmonary ROS,           Cardiovascular hypertension, Pt. on medications and Pt. on home beta blockers +CHF  + dysrhythmias Atrial Fibrillation   ------------------------------------------------------------------- Study Conclusions  - Left ventricle: The cavity size was mildly dilated. There was   mild concentric hypertrophy. Systolic function was severely   reduced. The estimated ejection fraction was in the range of 25%   to 30%.   Neuro/Psych PSYCHIATRIC DISORDERS Anxiety Depression negative neurological ROS     GI/Hepatic negative GI ROS, Neg liver ROS,   Endo/Other  diabetesHypothyroidism   Renal/GU CRFRenal disease     Musculoskeletal negative musculoskeletal ROS (+)   Abdominal   Peds  Hematology negative hematology ROS (+)   Anesthesia Other Findings Day of surgery medications reviewed with the patient.  Reproductive/Obstetrics                             Anesthesia Physical Anesthesia Plan  ASA: III  Anesthesia Plan: General   Post-op Pain Management:    Induction: Intravenous  PONV Risk Score and Plan: 3 and Ondansetron, Dexamethasone and Scopolamine patch - Pre-op  Airway Management Planned: Oral ETT  Additional Equipment:   Intra-op Plan:   Post-operative Plan: Extubation in OR  Informed Consent:   Plan Discussed with:   Anesthesia Plan Comments:         Anesthesia Quick Evaluation

## 2017-06-06 NOTE — Progress Notes (Signed)
Inpatient Rehabilitation  I have received insurance authorization from [facility] for re-admission to IP Rehab.  Note surgery plans today.  Plan to follow up with team regarding medical readiness.  Please call with questions.   Carmelia Roller., CCC/SLP Admission Coordinator  [place]  Cell [phone]

## 2017-06-06 NOTE — Progress Notes (Signed)
Patient ID: [name], female   DOB: 11-16-1950, 66 y.o.   MRN: [id number]  [facility] Progress Note   Assessment/ Plan:   1. Chronic systolic congestive heart failure/dilated cardiomyopathy: Volume status continues to improve with ongoing hemodialysis/ultrafiltration and she continues to do better clinically. 2. ESRD: Continue hemodialysis on a Tuesday/Thursday/Saturday schedule, will order for next hemodialysis tomorrow. Appreciate input from vascular surgery-may need to defer permanent dialysis access to some point as an outpatient after discharge to allow for better heart rate control/improved strength (the big challenge will be Coumadin adjustment). Awaiting confirmation with the kidney center at [facility] regarding chair time.  3. 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 5. Nutrition: With protein calorie malnutrition/chronic illness-continue nutritional supplementation with feeding supplement/MVI. 6. Atrial fibrillation with RVR: Improving rate control with amiodarone and metoprolol- plans in place for possible TEE guided cardioversion if rate control remains difficult on beta-blocker (dose limited by blood pressure). 7. Hypotension: On midodrine, monitor with ultrafiltration and hemodialysis  Subjective:   She reports to be doing well and denies any acute events at dialysis or overnight.    Objective:   BP 110/82   Pulse 94   Temp 98.5 F (36.9 C) (Oral)   Resp 16   Ht 5\' 5"  (1.651 m)   Wt 72.5 kg (159 lb 13.3 oz)   SpO2 96%   BMI 26.60 kg/m   Physical Exam: ZDG:[id number] resting in bed CVS: Pulse irregularly irregular and normal rate, S1 and S2 with ejection systolic murmur Resp: Diminished breath sounds  over bases, anteriorly clear to auscultation Abd: Soft, flat, nontender, bowel sounds normal Ext: Trace-1+ lower extremity edema with both legs in soft boots  Labs: BMET  Recent Labs Lab 05/31/17 2233 06/02/17 1729 06/03/17 0509 06/04/17 1431 06/05/17 0845  NA 136 134* 136 136 136  K 3.9 3.4* 3.7 4.3 4.7  CL 98* 98* 101 101 100*  CO2 26 24 25  21* 21*  GLUCOSE 86 87 114* 131* 100*  BUN 31* 34* 8 27* 35*  CREATININE 3.32* 3.31* 1.37* 2.98* 3.56*  CALCIUM 8.7* 8.5* 8.1* 8.8* 8.7*  PHOS 3.9 3.8  --  4.5 5.9*   CBC  Recent Labs Lab 05/31/17 2233 06/02/17 1730 06/05/17 0845  WBC 7.8 9.9 7.8  HGB 8.3* 8.9* 9.0*  HCT 26.3* 28.3* 29.4*  MCV 106.5* 105.6* 108.5*  PLT 221 271 333   Medications:    . amiodarone  400 mg Oral BID  . [START ON 06/07/2017] darbepoetin (ARANESP) injection - DIALYSIS  60 mcg Intravenous Q Thu-HD  . famotidine  20 mg Oral QHS  . feeding supplement (PRO-STAT SUGAR FREE 64)  30 mL Oral BID  . Gerhardt's butt cream   Topical TID  . hydrocerin   Topical BID  . levothyroxine  100 mcg Oral QAC breakfast  . linaclotide  145 mcg Oral QAC breakfast  . megestrol  40 mg Oral BID  . metoprolol tartrate  50 mg Oral BID  . midodrine  10 mg Oral TID WC  . multivitamin  1 tablet Oral QHS  . polyethylene glycol  17 g Oral BID  . QUEtiapine  25 mg Oral QHS  . senna-docusate  2 tablet Oral QHS  . sertraline  25 mg Oral  QHS  . Warfarin - Pharmacist Dosing Inpatient   Does not apply [id number]   Elmarie Shiley, MD 06/06/2017, 10:29 AM

## 2017-06-06 NOTE — Progress Notes (Signed)
Progress Note  Patient Name: [name] Date of Encounter: 06/06/2017  Primary Cardiologist: Dr Nehemiah Massed; would like to fu with Dr Rockey Situ  Subjective   No chest pain or dyspnea  Inpatient Medications    Scheduled Meds: . amiodarone  400 mg Oral BID  . [START ON 06/07/2017] darbepoetin (ARANESP) injection - DIALYSIS  60 mcg Intravenous Q Thu-HD  . famotidine  20 mg Oral QHS  . feeding supplement (PRO-STAT SUGAR FREE 64)  30 mL Oral BID  . Gerhardt's butt cream   Topical TID  . hydrocerin   Topical BID  . levothyroxine  100 mcg Oral QAC breakfast  . linaclotide  145 mcg Oral QAC breakfast  . megestrol  40 mg Oral BID  . metoprolol tartrate  50 mg Oral BID  . midodrine  10 mg Oral TID WC  . multivitamin  1 tablet Oral QHS  . polyethylene glycol  17 g Oral BID  . QUEtiapine  25 mg Oral QHS  . senna-docusate  2 tablet Oral QHS  . sertraline  25 mg Oral QHS  . Warfarin - Pharmacist Dosing Inpatient   Does not apply q1800   Continuous Infusions: . ferric gluconate (FERRLECIT/NULECIT) IV Stopped (06/05/17 1545)   PRN Meds: acetaminophen, acetaminophen, ALPRAZolam, bisacodyl, ipratropium-albuterol, ondansetron (ZOFRAN) IV, ondansetron (ZOFRAN) IV   Vital Signs    Vitals:   06/05/17 1921 06/05/17 2157 06/05/17 2355 06/06/17 0526  BP: 113/81 114/86 110/82   Pulse: 98 100 94   Resp: 18  16   Temp: 98.3 F (36.8 C)  98.3 F (36.8 C) 98.5 F (36.9 C)  TempSrc: Oral   Oral  SpO2: 98%  96%   Weight:      Height:        Intake/Output Summary (Last 24 hours) at 06/06/17 0915 Last data filed at 06/06/17 0900  Gross per 24 hour  Intake              540 ml  Output             2500 ml  Net            -1960 ml   Filed Weights   06/03/17 0423 06/05/17 0835 06/05/17 1215  Weight: 71 kg (156 lb 8.4 oz) 75 kg (165 lb 5.5 oz) 72.5 kg (159 lb 13.3 oz)    Telemetry    Atrial fibrillation, rate improving- Personally Reviewed   Physical Exam   GEN: WD/WN chronically ill  appearing No acute distress.   Neck: No JVD Cardiac: irregular Respiratory: Clear to auscultation bilaterally. No rhonchi GI: Soft, NT/ND MS: in splints and wrapped Neuro:  no focal findings   Labs    Chemistry  Recent Labs Lab 06/02/17 1729 06/03/17 0509 06/04/17 1431 06/05/17 0845  NA 134* 136 136 136  K 3.4* 3.7 4.3 4.7  CL 98* 101 101 100*  CO2 24 25 21* 21*  GLUCOSE 87 114* 131* 100*  BUN 34* 8 27* 35*  CREATININE 3.31* 1.37* 2.98* 3.56*  CALCIUM 8.5* 8.1* 8.8* 8.7*  ALBUMIN 2.8*  --  3.0* 2.9*  GFRNONAA 14* 40* 15* 12*  GFRAA 16* 46* 18* 14*  ANIONGAP 12 10 14 15      Hematology  Recent Labs Lab 05/31/17 2233 06/02/17 1730 06/05/17 0845  WBC 7.8 9.9 7.8  RBC 2.47* 2.68* 2.71*  HGB 8.3* 8.9* 9.0*  HCT 26.3* 28.3* 29.4*  MCV 106.5* 105.6* 108.5*  MCH 33.6 33.2 33.2  MCHC 31.6 31.4  30.6  RDW 24.4* 24.0* 25.0*  PLT 221 271 333    Cardiac Enzymes  Recent Labs Lab 06/03/17 0029 06/03/17 0509  TROPONINI 0.04* 0.04*    Patient Profile     66 y.o. female with a hx of end-stage renal disease, dilated cardiomyopathy ejection fraction 25%, prolonged hospitalization, hypotensionwith atrial fibrillation with RVR.  While admitted earlier her Cardizem was stopped due to hypotension during HD and started on Amio 200mg  TID along with metoprolol 12.5mg  BID. Echocardiogram July 2018 showed ejection fraction 25-30%, moderate aortic insufficiency, mild mitral regurgitation, mild biatrial enlargement and moderate tricuspid regurgitation.  Assessment & Plan    1 Atrial fibrillation-duration unclear. Continue Coumadin. Rate improving. Continue amiodarone at present dose. Continue metoprolol 50 mg twice a day. If rate is difficult to control it might be worthwhile considering TEE guided cardioversion.   2 Cardiomyopathy-question tachycardia mediated. Not presently a good candidate for ischemia evaluation but can be considered in the future if does not improve.  Continue metoprolol. I will transition to Toprol later once dose is established. Blood pressure will not allow ARB or hydralazine/nitrates.   3 end-stage renal disease-dialysis per nephrology.   Signed, Kirk Ruths, MD  06/06/2017, 9:15 AM

## 2017-06-06 NOTE — Progress Notes (Signed)
  Progress Note    06/06/2017 3:11 PM Day of Surgery  Subjective: no complaints today  Vitals:   06/06/17 0526 06/06/17 1425  BP:  107/76  Pulse:  94  Resp:  18  Temp: 98.5 F (36.9 C) 98.1 F (36.7 C)  SpO2:  98%    Physical Exam: aaox3 Right ij catheter in place Palpable radial pulses bilaterally  CBC    Component Value Date/Time   WBC 7.8 06/05/2017 0845   RBC 2.71 (L) 06/05/2017 0845   HGB 9.0 (L) 06/05/2017 0845   HGB 14.4 09/19/2014 0621   HCT 29.4 (L) 06/05/2017 0845   HCT 43.3 09/19/2014 0621   PLT 333 06/05/2017 0845   PLT 225 09/19/2014 0621   MCV 108.5 (H) 06/05/2017 0845   MCV 98 09/19/2014 0621   MCH 33.2 06/05/2017 0845   MCHC 30.6 06/05/2017 0845   RDW 25.0 (H) 06/05/2017 0845   RDW 12.6 09/19/2014 0621   LYMPHSABS 1.3 05/21/2017 0917   LYMPHSABS 2.4 09/19/2014 0621   MONOABS 0.8 05/21/2017 0917   MONOABS 0.6 09/19/2014 0621   EOSABS 0.5 05/21/2017 0917   EOSABS 0.1 09/19/2014 0621   BASOSABS 0.0 05/21/2017 0917   BASOSABS 0.0 09/19/2014 0621    BMET    Component Value Date/Time   NA 136 06/05/2017 0845   NA 140 09/19/2014 0621   K 4.7 06/05/2017 0845   K 4.1 09/19/2014 0621   CL 100 (L) 06/05/2017 0845   CL 109 (H) 09/19/2014 0621   CO2 21 (L) 06/05/2017 0845   CO2 23 09/19/2014 0621   GLUCOSE 100 (H) 06/05/2017 0845   GLUCOSE 99 09/19/2014 0621   BUN 35 (H) 06/05/2017 0845   BUN 10 09/19/2014 0621   CREATININE 3.56 (H) 06/05/2017 0845   CREATININE 0.62 09/19/2014 0621   CALCIUM 8.7 (L) 06/05/2017 0845   CALCIUM 8.7 05/29/2017 1616   GFRNONAA 12 (L) 06/05/2017 0845   GFRNONAA >60 09/19/2014 0621   GFRAA 14 (L) 06/05/2017 0845   GFRAA >60 09/19/2014 0621    INR    Component Value Date/Time   INR 2.88 06/06/2017 0311   INR 1.0 09/18/2014 0953     Intake/Output Summary (Last 24 hours) at 06/06/17 1511 Last data filed at 06/06/17 1450  Gross per 24 hour  Intake              540 ml  Output                0 ml  Net               540 ml     Assessment:  66 y.o. female is now esrd in need of permanent access Plan: inr elevated today and yesterday level appears to be erroneous. [place] for discharge to rehab and will set f/u appointment in 6 weeks to discuss permanent access in her right arm, avf vs graft.    Itzae Miralles C. [name], MD [facility] Office: [phone] Pager: [phone]  06/06/2017 3:11 PM

## 2017-06-06 NOTE — Discharge Summary (Addendum)
Physician Discharge Summary   Patient ID: [name] MRN: [id number] DOB/AGE: 1951/02/23 66 y.o.  Admit date: 06/03/2017 Discharge date: 06/06/2017  Primary Care Physician:  Tracie Harrier, MD  Discharge Diagnoses:    . Atrial fibrillation with RVR ([place]) . ESRD (end stage renal disease) ([place]) . Chronic systolic CHF (congestive heart failure) ([place]) . Hypotension . Critical illness myopathy   Anemia of chronic disease   Moderate protein calorie malnutrition   Consults:  Nephrology Cardiology  Recommendations for Outpatient Follow-up:  1. Please repeat CBC/BMET at next visit 2. Continue hemodialysis Tuesday, Thursday, Saturday schedule 3. Currently awaiting confirmation with kidney Center at [facility] regarding chair time   DIET: heart healthy diet    Allergies:  No Known Allergies   DISCHARGE MEDICATIONS: Current Discharge Medication List    CONTINUE these medications which have NOT CHANGED   Details  ALPRAZolam (XANAX) 0.25 MG tablet Take 0.25 mg by mouth at bedtime as needed for anxiety.    amiodarone (PACERONE) 200 MG tablet Take 1 tablet (200 mg total) by mouth 2 (two) times daily.    famotidine (PEPCID) 20 MG tablet Take 1 tablet (20 mg total) by mouth at bedtime.    levothyroxine (SYNTHROID, LEVOTHROID) 50 MCG tablet Take 50 mcg by mouth daily before breakfast.    midodrine (PROAMATINE) 5 MG tablet Take 1 tablet (5 mg total) by mouth 3 (three) times daily with meals.    sertraline (ZOLOFT) 25 MG tablet Take 25 mg by mouth daily.      STOP taking these medications     apixaban (ELIQUIS) 2.5 MG TABS tablet      diltiazem (TIAZAC) 180 MG 24 hr capsule        Current inpatient medications that need to be continued on discharge  Currently patient is on  Amiodarone 400 mg twice a day ( please confirm the amiodarone dose at the time of discharge with cardiology) Gerhart's butt cream topical TID Eucerin cream to the right foot lesions twice  daily Synthroid 100 g daily am ( please note patient was taking Synthroid 50 MCG daily, dose will need to be changed at the time of discharge from CIR)  Midodrine 10 mg 3 times a day with meals Megace 40 mg twice a day Linzess 145 MCG daily before breakfast Warfarin- patient is currently on Coumadin and the dose will need to be established at the time of discharge Metoprolol 50 mg twice a day    Brief H and P: For complete details please refer to admission H and P, but in brief [name] is an 66 y.o. female PMH of recent prolonged hospitalization in July for treatment of cellulitis/sepsis complicated by PEA cardiac arrest, arms, kidney failure requiring no onset dialysis and upper extremity DVTs. She also has had new onset atrial fibrillation and 2-D echo showed an EF of 25-30%. Readmitted 06/02/17 after she developed A. fib with RVR with heart rate into the 160s during HD.  Hospital Course:     Atrial fibrillation with RVR ([place])- duration unclear - continue amiodarone, currently on 400 mg twice a day, please confirm amiodarone dose at the time of discharge - Metoprolol increased to 50 mg twice a day - Cardiology following closely, recommended if it is difficult to control, Will consider TEE guided cardioversion - continue Coumadin per pharmacy  Active Problems: Cardiomyopathy/Chronic systolic CHF - likely tachycardia mediated cardiomyopathy. Per cardiology not presently a good candidate for ischemic evaluation however can be considered if does not improve - Continue  beta blocker. BP will not allow other medications. - currently stable,volume status improving with ongoing hemodialysis    Hypotension - patient started on midodrine 10 mg 3 times a day    ESRD (end stage renal disease) ([place]) - Continue hemodialysis TTS schedule, nephrology following closely - Per nephrology, awaiting confirmation with the kidney center at [facility] regarding chair time     Pressure injury of skin,  left foot lower leg - plastic surgery following, VAC dressing change next week  Moderate protein calorie nutrition - Continue nutritional supplements  Patient is accepted to inpatient rehabilitation   Day of Discharge No complaints, doing well  BP 110/82   Pulse 94   Temp 98.5 F (36.9 C) (Oral)   Resp 16   Ht 5\' 5"  (1.651 m)   Wt 72.5 kg (159 lb 13.3 oz)   SpO2 96%   BMI 26.60 kg/m   Physical Exam: General: Alert and awake oriented x3 not in any acute distress. HEENT: anicteric sclera, pupils reactive to light and accommodation CVS: S1-S2 clear, irregular Chest: diminished breath sound at the bases Abdomen: soft nontender, nondistended, normal bowel sounds Extremities: VAC and dressing on lower extremities, 1+ edema Neuro: Cranial nerves II-XII intact, no focal neurological deficits   The results of significant diagnostics from this hospitalization (including imaging, microbiology, ancillary and laboratory) are listed below for reference.    LAB RESULTS: Basic Metabolic Panel:  Recent Labs Lab 06/03/17 0029  06/04/17 1431 06/05/17 0845  NA  --   < > 136 136  K  --   < > 4.3 4.7  CL  --   < > 101 100*  CO2  --   < > 21* 21*  GLUCOSE  --   < > 131* 100*  BUN  --   < > 27* 35*  CREATININE  --   < > 2.98* 3.56*  CALCIUM  --   < > 8.8* 8.7*  MG 1.8  --   --   --   PHOS  --   --  4.5 5.9*  < > = values in this interval not displayed. Liver Function Tests:  Recent Labs Lab 06/04/17 1431 06/05/17 0845  ALBUMIN 3.0* 2.9*   No results for input(s): LIPASE, AMYLASE in the last 168 hours. No results for input(s): AMMONIA in the last 168 hours. CBC:  Recent Labs Lab 06/02/17 1730 06/05/17 0845  WBC 9.9 7.8  HGB 8.9* 9.0*  HCT 28.3* 29.4*  MCV 105.6* 108.5*  PLT 271 333   Cardiac Enzymes:  Recent Labs Lab 06/03/17 0029 06/03/17 0509  TROPONINI 0.04* 0.04*   BNP: Invalid input(s): POCBNP CBG:  Recent Labs Lab 06/02/17 1223 06/02/17 2220   GLUCAP 107* 80    Significant Diagnostic Studies:  No results found.  2D ECHO:   Disposition and Follow-up:    DISPOSITION:inpatient rehabilitation   DISCHARGE FOLLOW-UP Follow-up Information    Tracie Harrier, MD. Schedule an appointment as soon as possible for a visit in 2 week(s).   Specialty:  Internal Medicine Contact information: [address] [place] [address] [phone]            Time spent on Discharge: 35 minutes  Signed:   Estill Cotta M.D. [facility] 06/06/2017, 12:42 PM Pager: [phone]

## 2017-06-06 NOTE — H&P (Signed)
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    HPI: [name] an 66 y.o.female with history of HTN, A fib-on pradaxa who was admitted on [facility] on 04/08/17 due to BLE cellulitis with difficulty walking. She was found to be septic with leucocytosis, hypotensive with two week history of DOE and BLE edema with cellulitis.  She was started on broad spectrum antibiotics and was noted to be volume overloaded with right pleural effusion requiring CRRT. Hospital course significant for PEA arrest 7/4, ARDS, anuria requiring  IJ placement by vascular surgery as now HD dependent, shocked liver with abnormal LFTs, thrombocytopenia, ongoing issues with hypotension and tachycardia.  She developed BUE edema due to "occlusive deep vein thrombosis in the left cephalic vein extending from the mid humeral region down towards the elbow".   She has had issues with A fib with RVR and placed on amiodarone and Cardizem for HR control and Dr. [name] recommended coumadin or low dose Eliquis due to A fib/stroke prophylaxis.   Serologic work up negative and she was HD dependent.  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  She was admitted to [facility] on 05/08/17 for intensive rehab program. She was noted to have BLE ulcers that have been monitored. HD has been ongoing with minimal renal recovery. She did develop vaginal bleeding and vaginal/pelvic ultrasound revealed "markedly abnormal and thickened endometrium containing a focal 3.7 cm hyperechoic mass". Gynecology was consulted and recommended outpatient follow up with biopsy. Left leg wound was debrided at bedside with minimal results therefore Dr. [name] was consulted for assistance. She was transitioned from Eliquis to coumadin and taken to OR for debridement with placement of Acell and VAC on 05/28/17.  She  developed A fib with flutter and was transferred to acute for treatment on 8/26. Amiodarone and metoprolol doses were increased for rate with HR currently in 90- 110 range.  Dr. [name] was consulted for placement of AVG but INR remains therapeutic and plans for graft in 6 weeks on outpatient basis.  She was cleared to resume activity and admitted back to resume CIR program    Review of Systems  Constitutional: Negative for diaphoresis.  HENT: Negative for hearing loss and tinnitus.   Eyes: Negative for blurred vision and double vision.  Respiratory: Negative for cough and shortness of breath.   Cardiovascular: Negative for chest pain, palpitations and leg swelling.  Gastrointestinal: Positive for constipation. Negative for abdominal pain, heartburn and nausea.  Musculoskeletal: Positive for myalgias (due to lying in bed). Negative for back pain.  Skin: Negative for itching and rash.  Neurological: Positive for focal weakness and weakness. Negative for dizziness, sensory change and speech change.  Psychiatric/Behavioral: Negative for depression. The patient is not nervous/anxious.   All other systems reviewed and are negative.     Past Medical History:  Diagnosis Date  . A-fib ([place])    on pradaxa  . Chronic kidney disease   . Dysrhythmia   . Hypertension   . Hypothyroidism   . Thyroid disease     Past Surgical History:  Procedure Laterality Date  . APPLICATION OF A-CELL OF EXTREMITY Left 05/28/2017   Procedure: APPLICATION OF A-CELL OF LEFT LOWER EXTREMITY;  Surgeon: Wallace Going, DO;  Location: [facility];  Service: Plastics;  Laterality: Left;  . APPLICATION OF WOUND VAC Left 05/28/2017  Procedure: APPLICATION OF WOUND VAC;  Surgeon: Wallace Going, DO;  Location: [facility];  Service: Plastics;  Laterality: Left;  . DIALYSIS/PERMA CATHETER INSERTION N/A 04/26/2017   Procedure: Dialysis/Perma Catheter Insertion;  Surgeon: Algernon Huxley, MD;  Location: [facility];   Service: Cardiovascular;  Laterality: N/A;  . INCISION AND DRAINAGE OF WOUND Left 05/28/2017   Procedure: IRRIGATION AND DEBRIDEMENT LEFT LOWER LEG WOUND;  Surgeon: Wallace Going, DO;  Location: [facility];  Service: Plastics;  Laterality: Left;  . PR DEBRIDEMENT, SKIN, SUB-Q TISSUE,=<20 SQ CM  05/21/2017      . PR DEBRIDEMENT, SKIN, SUB-Q TISSUE,EACH ADD 20 SQ CM  05/21/2017      . WRIST ARTHROSCOPY      Family History  Problem Relation Age of Onset  . AAA (abdominal aortic aneurysm) Mother     Social History:   Married. Husband retired and in reasonable health. Retired from [employer] in [employer].  Independent  PTA without AD. She reports that she has never smoked. She has never used smokeless tobacco. She reports that she does not drink alcohol. She does not use illicit drugs.    Allergies: No Known Allergies    Medications Prior to Admission  Medication Sig Dispense Refill  . ALPRAZolam (XANAX) 0.25 MG tablet Take 0.25 mg by mouth at bedtime as needed for anxiety.    Marland Kitchen amiodarone (PACERONE) 200 MG tablet Take 1 tablet (200 mg total) by mouth 2 (two) times daily.    Marland Kitchen apixaban (ELIQUIS) 2.5 MG TABS tablet Take 1 tablet (2.5 mg total) by mouth 2 (two) times daily. 60 tablet   . diltiazem (TIAZAC) 180 MG 24 hr capsule Take 1 capsule (180 mg total) by mouth daily.    . famotidine (PEPCID) 20 MG tablet Take 1 tablet (20 mg total) by mouth at bedtime.    Marland Kitchen levothyroxine (SYNTHROID, LEVOTHROID) 50 MCG tablet Take 50 mcg by mouth daily before breakfast.    . midodrine (PROAMATINE) 5 MG tablet Take 1 tablet (5 mg total) by mouth 3 (three) times daily with meals.    . sertraline (ZOLOFT) 25 MG tablet Take 25 mg by mouth daily.      Home: Home Living Family/patient expects to be discharged to:: Inpatient rehab Living Arrangements: Spouse/significant other Available Help at Discharge: Family, Available 24 hours/day Type of Home: House Home Access: Stairs to enter [employer] of Steps: 4 Entrance Stairs-Rails: Right, Left, Can reach both Home Layout: One level Bathroom Shower/Tub: Tub/shower unit, Multimedia programmer: Standard Bathroom Accessibility: Yes Home Equipment: None Additional Comments: N/A  Lives With: Spouse   Functional History: Prior Function Level of Independence: Independent Comments: Independent with ADLs, enjoys household tasks  Functional Status:  Mobility: Bed Mobility Overal bed mobility: Needs Assistance Bed Mobility: Rolling, Sidelying to Sit Rolling: Min assist Sidelying to sit: Mod assist General bed mobility comments: to roll assist to bend L knee, then pt able to reach for railing, assist L LE off bed and to lift trunk upright Transfers Overall transfer level: Needs assistance Equipment used: 2 person hand held assist Transfers: Sit to/from Stand, Google Transfers Sit to Stand: Mod assist, +2 physical assistance, Max assist Squat pivot transfers: Max assist  Lateral/Scoot Transfers: Mod assist General transfer comment: +2 for initial sit to stand, then stood with +1 max A and second person for hygiene; then squat pivot to recliner max A utilizing pad under pt      ADL: ADL Overall  ADL's : Needs assistance/impaired Eating/Feeding: Modified independent, Sitting Grooming: Sitting, Wash/dry hands, Wash/dry face, Oral care, Set up Grooming Details (indicate cue type and reason): Pt tolerated sitting EOB for 25 minutes today with supervision and set up to wash hands/face and brush teeth. Pt demonstrating continued improvement in overall strength and activity tolerance for self care tasks.  Upper Body Bathing: Minimal assistance, Sitting Lower Body Bathing: Bed level, Moderate assistance Upper Body Dressing : Minimal assistance, Sitting Lower Body Dressing: Moderate assistance, Sitting/lateral leans Functional mobility during ADLs: +2 for physical assistance, Maximal  assistance  Cognition: Cognition Overall Cognitive Status: Within Functional Limits for tasks assessed Orientation Level: Oriented X4 Cognition Arousal/Alertness: Awake/alert Behavior During Therapy: WFL for tasks assessed/performed Overall Cognitive Status: Within Functional Limits for tasks assessed   Blood pressure 107/76, pulse 94, temperature 98.1 F (36.7 C), temperature source Oral, resp. rate 18, height _0  (1.651 m), weight 72.5 kg (159 lb 13.3 oz), SpO2 98 %. Physical Exam  Nursing note and vitals reviewed. Constitutional: She is oriented to person, place, and time. She appears well-developed and well-nourished. No distress.  HENT:  Head: Normocephalic and atraumatic.  Mouth/Throat: Oropharynx is clear and moist.  Eyes: Pupils are equal, round, and reactive to light. Conjunctivae and EOM are normal.  Neck: Normal range of motion. Neck supple.  Cardiovascular: An irregularly irregular rhythm present. Tachycardia present.   Respiratory: Effort normal and breath sounds normal. No stridor. No respiratory distress. She has no wheezes.  GI: Soft. Bowel sounds are normal. She exhibits no distension. There is no tenderness.  Musculoskeletal: She exhibits no edema or tenderness.  Right foot with healed abrasions. Left shin/foot with VAC in place.   Neurological: She is alert and oriented to person, place, and time.  Sensation intact to light touch Motor: B/l UE: 4+/5 proximal to distal B/l LE: HF 3-/5, KE 3/5, ADF/PF 3+/5  Skin: Skin is warm and dry. She is not diaphoretic.  Psychiatric: She has a normal mood and affect. Her behavior is normal. Judgment and thought content normal.    Results for orders placed or performed during the hospital encounter of 06/03/17 (from the past 48 hour(s))  Protime-INR     Status: Abnormal   Collection Time: 06/05/17  5:00 AM  Result Value Ref Range   Prothrombin Time 16.3 (H) 11.4 - 15.2 seconds   INR 1.30   Renal function panel      Status: Abnormal   Collection Time: 06/05/17  8:45 AM  Result Value Ref Range   Sodium 136 135 - 145 mmol/L   Potassium 4.7 3.5 - 5.1 mmol/L   Chloride 100 (L) 101 - 111 mmol/L   CO2 21 (L) 22 - 32 mmol/L   Glucose, Bld 100 (H) 65 - 99 mg/dL   BUN 35 (H) 6 - 20 mg/dL   Creatinine, Ser 3.56 (H) 0.44 - 1.00 mg/dL   Calcium 8.7 (L) 8.9 - 10.3 mg/dL   Phosphorus 5.9 (H) 2.5 - 4.6 mg/dL   Albumin 2.9 (L) 3.5 - 5.0 g/dL   GFR calc non Af Amer 12 (L) >60 mL/min   GFR calc Af Amer 14 (L) >60 mL/min    Comment: (NOTE) The eGFR has been calculated using the CKD EPI equation. This calculation has not been validated in all clinical situations. eGFR's persistently <60 mL/min signify possible Chronic Kidney Disease.    Anion gap 15 5 - 15  CBC     Status: Abnormal   Collection Time: 06/05/17  8:45  AM  Result Value Ref Range   WBC 7.8 4.0 - 10.5 K/uL   RBC 2.71 (L) 3.87 - 5.11 MIL/uL   Hemoglobin 9.0 (L) 12.0 - 15.0 g/dL   HCT 29.4 (L) 36.0 - 46.0 %   MCV 108.5 (H) 78.0 - 100.0 fL   MCH 33.2 26.0 - 34.0 pg   MCHC 30.6 30.0 - 36.0 g/dL   RDW 25.0 (H) 11.5 - 15.5 %   Platelets 333 150 - 400 K/uL  Protime-INR     Status: Abnormal   Collection Time: 06/05/17  2:25 PM  Result Value Ref Range   Prothrombin Time 36.2 (H) 11.4 - 15.2 seconds   INR 3.67   Protime-INR     Status: Abnormal   Collection Time: 06/06/17  3:11 AM  Result Value Ref Range   Prothrombin Time 29.9 (H) 11.4 - 15.2 seconds   INR 2.88    No results found.     Medical Problem List and Plan: 1.  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 2.  DVT Prophylaxis/Anticoagulation: Pharmaceutical: Coumadin--question transition back to Eliquis 3. Pain Management: tylenol prn  4. Mood: Remains motivated. LCSW to follow for support.  5. Neuropsych: This patient is capable of making decisions on her own behalf. 6. Skin/Wound Care: Maintain adequate nutritional status to help promote healing. VAC  dressing changes per plastics.  7. Fluids/Electrolytes/Nutrition: Monitor I/O. Continue megace to help with appetite. On cardiac diet to help with food choices--add 1200 cc/FR 8. A fib with RVR: To continue amiodarone at 400 mg bid and metoprolol 50 mg bid.  9. ESRD: AVG postponed for now. HD dependent--Tue/Thu/Sat schedule at end of day to help with tolerance of therapy. 10. Hypotension: On amiodarone 10 mg tid. 11. Anemia of chronic disease: To start IV iron for iron deficiency. On aranesp weekly. 12. Left foot wound: Wound VAC over Acell for healing--dressing changes weekly by plastics.  13. Anxiety/depression: Team to provide ego support.  Continue Seroquel and Zoloft.  14. Constipation: May need disimpaction again--warm water enema. Continue miralax and linzess. Increase senna S to bid.    Post Admission Physician Evaluation: 1. Preadmission assessment reviewed and changes made below. 2. Functional deficits secondary  to CIM. 3. Patient is admitted to receive collaborative, interdisciplinary care between the physiatrist, rehab nursing staff, and therapy team. 4. Patient's level of medical complexity and substantial therapy needs in context of that medical necessity cannot be provided at a lesser intensity of care such as a SNF. 5. Patient has experienced substantial functional loss from his/her baseline which was documented above under the "Functional History" and "Functional Status" headings.  Judging by the patient's diagnosis, physical exam, and functional history, the patient has potential for functional progress which will result in measurable gains while on inpatient rehab.  These gains will be of substantial and practical use upon discharge  in facilitating mobility and self-care at the household level. 59. Physiatrist will provide 24 hour management of medical needs as well as oversight of the therapy plan/treatment and provide guidance as appropriate regarding the interaction of the  two. 7. 24 hour rehab nursing will assist with safety, skin/wound care, disease management, pain management and patient education  and help integrate therapy concepts, techniques,education, etc. 8. PT will assess and treat for/with: Lower extremity strength, range of motion, stamina, balance, functional mobility, safety, adaptive techniques and equipment, woundcare, coping skills, pain control, education.   Goals are: Min A/Mod A. 9. OT will assess and treat  for/with: ADL's, functional mobility, safety, upper extremity strength, adaptive techniques and equipment, wound mgt, ego support, and community reintegration.   Goals are: Min A/Mod A. Therapy may not proceed with showering this patient. 10. SLP will assess and treat for/with: cognition.  Goals are: supervision/Min A. 11. Case Management and Social Worker will assess and treat for psychological issues and discharge planning. 12. Team conference will be held weekly to assess progress toward goals and to determine barriers to discharge. 13. Patient will receive at least 3 hours of therapy per day at least 5 days per week. 14. ELOS: 5-8 days.       15. Prognosis:  good  Delice Lesch, MD, [address], [place] 06/06/2017

## 2017-06-06 NOTE — Progress Notes (Signed)
Inpatient Rehabilitation  I have received medical clearance to admit patient to IP Rehab today. Updated RNCM.  Please call if questions.   Carmelia Roller., CCC/SLP Admission Coordinator  [place]  Cell [phone]

## 2017-06-07 ENCOUNTER — Inpatient Hospital Stay (HOSPITAL_COMMUNITY): Payer: Medicare Other | Admitting: Physical Therapy

## 2017-06-07 ENCOUNTER — Inpatient Hospital Stay (HOSPITAL_COMMUNITY): Payer: Medicare Other | Admitting: Occupational Therapy

## 2017-06-07 DIAGNOSIS — G7281 Critical illness myopathy: Principal | ICD-10-CM

## 2017-06-07 DIAGNOSIS — I4891 Unspecified atrial fibrillation: Secondary | ICD-10-CM

## 2017-06-07 DIAGNOSIS — N184 Chronic kidney disease, stage 4 (severe): Secondary | ICD-10-CM

## 2017-06-07 DIAGNOSIS — D62 Acute posthemorrhagic anemia: Secondary | ICD-10-CM

## 2017-06-07 DIAGNOSIS — S90859A Superficial foreign body, unspecified foot, initial encounter: Secondary | ICD-10-CM

## 2017-06-07 DIAGNOSIS — Z7901 Long term (current) use of anticoagulants: Secondary | ICD-10-CM

## 2017-06-07 DIAGNOSIS — S90852S Superficial foreign body, left foot, sequela: Secondary | ICD-10-CM

## 2017-06-07 DIAGNOSIS — D638 Anemia in other chronic diseases classified elsewhere: Secondary | ICD-10-CM

## 2017-06-07 DIAGNOSIS — I953 Hypotension of hemodialysis: Secondary | ICD-10-CM

## 2017-06-07 DIAGNOSIS — K5901 Slow transit constipation: Secondary | ICD-10-CM

## 2017-06-07 DIAGNOSIS — Z992 Dependence on renal dialysis: Secondary | ICD-10-CM

## 2017-06-07 LAB — PROTIME-INR
INR: 2.86
Prothrombin Time: 29.8 seconds — ABNORMAL HIGH (ref 11.4–15.2)

## 2017-06-07 LAB — RENAL FUNCTION PANEL
ALBUMIN: 2.9 g/dL — AB (ref 3.5–5.0)
Anion gap: 16 — ABNORMAL HIGH (ref 5–15)
BUN: 40 mg/dL — AB (ref 6–20)
CHLORIDE: 99 mmol/L — AB (ref 101–111)
CO2: 21 mmol/L — AB (ref 22–32)
Calcium: 8.8 mg/dL — ABNORMAL LOW (ref 8.9–10.3)
Creatinine, Ser: 3.76 mg/dL — ABNORMAL HIGH (ref 0.44–1.00)
GFR calc Af Amer: 14 mL/min — ABNORMAL LOW (ref >60)
GFR calc non Af Amer: 12 mL/min — ABNORMAL LOW (ref >60)
GLUCOSE: 81 mg/dL (ref 65–99)
PHOSPHORUS: 5.2 mg/dL — AB (ref 2.5–4.6)
POTASSIUM: 3.4 mmol/L — AB (ref 3.5–5.1)
Sodium: 136 mmol/L (ref 135–145)

## 2017-06-07 LAB — CBC
HEMATOCRIT: 32.5 % — AB (ref 36.0–46.0)
Hemoglobin: 10 g/dL — ABNORMAL LOW (ref 12.0–15.0)
MCH: 33.3 pg (ref 26.0–34.0)
MCHC: 30.8 g/dL (ref 30.0–36.0)
MCV: 108.3 fL — ABNORMAL HIGH (ref 78.0–100.0)
Platelets: 395 10*3/uL (ref 150–400)
RBC: 3 MIL/uL — ABNORMAL LOW (ref 3.87–5.11)
RDW: 24.1 % — AB (ref 11.5–15.5)
WBC: 2.7 10*3/uL — ABNORMAL LOW (ref 4.0–10.5)

## 2017-06-07 MED ORDER — POLYETHYLENE GLYCOL 3350 17 G PO PACK
17.0000 g | PACK | Freq: Two times a day (BID) | ORAL | Status: DC
  Administered 2017-06-10 – 2017-06-17 (×5): 17 g via ORAL
  Filled 2017-06-07 (×16): qty 1

## 2017-06-07 MED ORDER — DARBEPOETIN ALFA 60 MCG/0.3ML IJ SOSY
PREFILLED_SYRINGE | INTRAMUSCULAR | Status: AC
  Administered 2017-06-07: 60 ug via INTRAVENOUS
  Filled 2017-06-07: qty 0.3

## 2017-06-07 MED ORDER — SENNOSIDES-DOCUSATE SODIUM 8.6-50 MG PO TABS
2.0000 | ORAL_TABLET | Freq: Two times a day (BID) | ORAL | Status: DC
  Administered 2017-06-08 – 2017-06-18 (×10): 2 via ORAL
  Filled 2017-06-07 (×16): qty 2

## 2017-06-07 MED ORDER — MIDODRINE HCL 5 MG PO TABS
ORAL_TABLET | ORAL | Status: AC
  Administered 2017-06-07: 10 mg via ORAL
  Filled 2017-06-07: qty 2

## 2017-06-07 MED ORDER — WARFARIN SODIUM 1 MG PO TABS
1.0000 mg | ORAL_TABLET | Freq: Once | ORAL | Status: DC
  Filled 2017-06-07: qty 1

## 2017-06-07 NOTE — Evaluation (Signed)
Physical Therapy Assessment and Plan  Patient Details  Name: Tinley Rought MRN: [id number] Date of Birth: 1950/11/11  PT Diagnosis: Abnormal posture, Abnormality of gait, Cognitive deficits, Edema and Muscle weakness Rehab Potential: Good ELOS: 12-14 days   Today's Date: 06/07/2017 PT Individual Time: 1500-1530 PT Individual Time Calculation (min): 30 min    Problem List:  Patient Active Problem List   Diagnosis Date Noted  . Slow transit constipation   . Acute blood loss anemia   . Splinter of foot without major open wound or infection   . Dependence on renal dialysis ([place])   . Chronic kidney disease (CKD), stage IV (severe) ([place])   . Atrial fibrillation with rapid ventricular response ([place])   . Chronic anticoagulation   . Pressure injury of skin 06/04/2017  . Chronic systolic CHF (congestive heart failure) ([place]) 06/03/2017  . Atrial fibrillation with RVR ([place]) 06/02/2017  . ESRD (end stage renal disease) ([place])   . Vaginal discharge   . AKI (acute kidney injury) ([place])   . Bleeding   . A-fib ([place])   . Abnormal urinalysis   . Dependence on hemodialysis ([place])   . Ulcer of left lower extremity with fat layer exposed ([place])   . Skin ulcer of left foot with fat layer exposed ([place])   . Vaginal bleeding   . Hypoglycemia associated with diabetes ([place])   . Skin ulcer of right heel ([place])   . PAF (paroxysmal atrial fibrillation) ([place])   . Hypotension   . Leukocytosis   . Anemia of chronic disease   . Anxiety state   . Depression   . Prediabetes   . Stage 5 chronic kidney disease not on chronic dialysis ([place])   . 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 Pressure sore on buttocks 04/19/2017  . Acute renal failure (ARF) ([place])   . Cellulitis of lower extremity   . Edema extremities   . Acute respiratory failure ([place])   . Acute renal failure ([place])   . Multi-organ failure with heart failure ([place])   . Palliative care by specialist   . Goals of care, counseling/discussion   . Septic  shock ([place]) 04/08/2017    Past Medical History:  Past Medical History:  Diagnosis Date  . A-fib ([place])    on pradaxa  . Chronic kidney disease   . Dysrhythmia   . Hypertension   . Hypothyroidism   . Thyroid disease    Past Surgical History:  Past Surgical History:  Procedure Laterality Date  . APPLICATION OF A-CELL OF EXTREMITY Left 05/28/2017   Procedure: APPLICATION OF A-CELL OF LEFT LOWER EXTREMITY;  Surgeon: Wallace Going, DO;  Location: [facility];  Service: Plastics;  Laterality: Left;  . APPLICATION OF WOUND VAC Left 05/28/2017   Procedure: APPLICATION OF WOUND VAC;  Surgeon: Wallace Going, DO;  Location: [facility];  Service: Plastics;  Laterality: Left;  . DIALYSIS/PERMA CATHETER INSERTION N/A 04/26/2017   Procedure: Dialysis/Perma Catheter Insertion;  Surgeon: Algernon Huxley, MD;  Location: [facility];  Service: Cardiovascular;  Laterality: N/A;  . INCISION AND DRAINAGE OF WOUND Left 05/28/2017   Procedure: IRRIGATION AND DEBRIDEMENT LEFT LOWER LEG WOUND;  Surgeon: Wallace Going, DO;  Location: [facility];  Service: Plastics;  Laterality: Left;  . PR DEBRIDEMENT, SKIN, SUB-Q TISSUE,=<20 SQ CM  05/21/2017      . PR DEBRIDEMENT, SKIN, SUB-Q TISSUE,EACH ADD 20 SQ CM  05/21/2017      . WRIST ARTHROSCOPY  Assessment & Plan Clinical Impression:Viveka Hensleyis an 66 y.o.female with history of HTN, A fib-on pradaxa who was admitted on [facility] on 04/08/17 due to BLE cellulitis with difficulty walking. She was found to be septic with leucocytosis, hypotensive with two week history of DOE and BLE edema with cellulitis.  She was started on broad spectrum antibiotics and was noted to be volume overloaded with right pleural effusion requiring CRRT. Hospital course significant for PEA arrest 7/4, ARDS, anuria requiring IJ placement by vascular surgery as now HD dependent, shocked liver with abnormal LFTs, thrombocytopenia, ongoing issues with hypotension and tachycardia. She  developed BUE edema due to "occlusive deep vein thrombosis in the left cephalic vein extending from the mid humeral region down towards the elbow". She has had issues with A fib with RVR and placed on amiodarone and Cardizem for HR control and Dr. [name] recommended coumadin or low dose Eliquis due to A fib/stroke prophylaxis.  Serologic work up negative and she was HD dependent.  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  She was admitted to [facility] on 05/08/17 for intensive rehab program. She was noted to have BLE ulcers that have been monitored. HD has been ongoing with minimal renal recovery. She did develop vaginal bleeding and vaginal/pelvic ultrasound revealed "markedly abnormal and thickened endometrium containing a focal 3.7 cm hyperechoic mass". Gynecology was consulted and recommended outpatient follow up with biopsy. Left leg wound was debrided at bedside with minimal results therefore Dr. [name] was consulted for assistance. She was transitioned from Eliquis to coumadin and taken to OR for debridement with placement of Acell and VAC on 05/28/17.  She developed A fib with flutter and was transferred to acute for treatment on 8/26. Amiodarone and metoprolol doses were increased for rate with HR currently in 90- 110 range.  Dr. [name] was consulted for placement of AVG but INR remains therapeutic and plans for graft in 6 weeks on outpatient basis.  She was cleared to resume activity and admitted back to resume CIR program   Patient transferred to CIR on 06/06/2017 .   Patient currently requires total with mobility secondary to muscle weakness, decreased cardiorespiratoy endurance, decreased memory and decreased sitting balance, decreased standing balance, decreased postural control and decreased balance strategies.  Prior to hospitalization, patient was independent  with mobility and lived with Spouse in a  House home.  Home access is 4Stairs to enter, Ramped entrance.  Patient will benefit from skilled PT intervention to maximize safe functional mobility, minimize fall risk and decrease caregiver burden for planned discharge home with 24 hour supervision.  Anticipate patient will benefit from follow up [place] at discharge.  PT - End of Session Activity Tolerance: Tolerates 30+ min activity without fatigue Endurance Deficit: Yes Endurance Deficit Description: required frequent rest breaks; pt reported she felt "washed out" PT Assessment Rehab Potential (ACUTE/IP ONLY): Good PT Barriers to Discharge: Wound Care;Hemodialysis;Medical stability PT Barriers to Discharge Comments: needs to be able to transfer for HD PT Patient demonstrates impairments in the following area(s): Balance;Edema;Endurance;Motor;Safety;Skin Integrity PT Transfers Functional Problem(s): Bed Mobility;Bed to Chair;Car;Furniture PT Locomotion Functional Problem(s): Ambulation;Wheelchair Mobility;Stairs PT Plan PT Intensity: Minimum of 1-2 x/day ,45 to 90 minutes PT Frequency: 5 out of 7 days PT Duration Estimated Length of Stay: 12-14 days PT Treatment/Interventions: Ambulation/gait training;Balance/vestibular training;Cognitive remediation/compensation;Discharge planning;Community reintegration;DME/adaptive equipment instruction;Functional mobility training;Patient/family education;Pain management;Neuromuscular re-education;Psychosocial support;Splinting/orthotics;Therapeutic Exercise;Stair training;Therapeutic Activities;Skin care/wound management;UE/LE Strength  taining/ROM;UE/LE Coordination activities;Wheelchair propulsion/positioning PT Transfers Anticipated Outcome(s): supervision basic, min car PT Locomotion Anticipated Outcome(s): mod I w/c x 150', ambulation and steps TBD PT Recommendation Recommendations for Other Services: Speech consult Therapeutic Recreation Interventions: Outing/community reintergration Follow Up  Recommendations: Home health PT Patient destination: Home Equipment Recommended: To be determined  Skilled Therapeutic Intervention- pt with wound vac L lower leg/ankle.   Neuro re-ed - bil hip abduction against resistance, bil lower trunk rotation, bil bridging,, x 15 each.  Pt performed bed mobility and attempted to stand, but bowel incontinence requiring hygiene and clothing change in bed precluded assessment of other functional mobility.  Pt rolled repeatedly with min set-up assist for clothing and brief mgt, and bridged partially for placement of bed pain.  ELOS and LTGs explained to pt.  Bed alarm set and all needs left within reach.    PT Evaluation Precautions/Restrictions Precautions Precautions: Fall Precaution Comments: R IJ perm cath; wound vac LLE; LUE DVT Restrictions Weight Bearing Restrictions: No  Pain Pain Assessment Pain Assessment: No/denies pain Home Living/Prior Functioning Home Living Available Help at Discharge: Family;Available 24 hours/day Type of Home: House Home Access: Stairs to enter;Ramped entrance Entrance Stairs-Number of Steps: 4 Home Layout: One level Bathroom Shower/Tub: Tub/shower unit;Walk-in shower Bathroom Toilet: Standard Bathroom Accessibility: Yes  Lives With: Spouse Prior Function Level of Independence: Independent with gait;Independent with transfers;Independent with basic ADLs;Independent with homemaking with ambulation  Able to Take Stairs?: Yes Driving: Yes Vocation: Retired (worked at [employer] x 39 years) Comments: Independent with ADLs, enjoys household tasks Vision/Perception  Perception Perception: Within Functional Limits Praxis Praxis: Intact  Cognition Overall Cognitive Status: Within Functional Limits for tasks assessed Arousal/Alertness: Awake/alert Orientation Level: Oriented to person;Oriented to place;Oriented to time (pt intially unable to state year) Attention: Selective Selective Attention: Appears  intact Memory: Impaired (failed 3 word test over 10 min) Memory Impairment: Decreased recall of new information Awareness: Appears intact Safety/Judgment: Appears intact Sensation Sensation Light Touch: Appears Intact Hot/Cold: Appears Intact Proprioception: Appears Intact Coordination Gross Motor Movements are Fluid and Coordinated: No Fine Motor Movements are Fluid and Coordinated: Yes Finger Nose Finger Test: slow due to limited shoulder ROM, but fluid Heel Shin Test: NT due to weakness Motor  Motor Motor - Skilled Clinical Observations: generalized weakness  Mobility Bed Mobility Bed Mobility: Rolling Right;Rolling Left;Right Sidelying to Sit;Sit to Supine Rolling Right: 4: Min assist Rolling Right Details: Manual facilitation for weight shifting;Verbal cues for technique Rolling Left: 4: Min assist Rolling Left Details: Manual facilitation for weight shifting;Verbal cues for technique Right Sidelying to Sit: 2: Max assist Sit to Supine: 2: Max assist Transfers Transfers: Yes Sit to Stand: 2: Max assist;From elevated surface;From bed Sit to Stand Details: Manual facilitation for weight shifting;Manual facilitation for placement Sit to Stand Details (indicate cue type and reason): pt achieved partial standing without full hip ext or back ext, leaning forward over RW Squat Pivot Transfers: Not tested (comment) (incontinent of bowel; time contraints) Locomotion  Ambulation Ambulation: No Gait Gait: No Stairs / Additional Locomotion Stairs: No Wheelchair Mobility Wheelchair Mobility: No  Trunk/Postural Assessment  Cervical Assessment Cervical Assessment: Within Functional Limits Thoracic Assessment Thoracic Assessment: Exceptions to [facility] (mild kyphosis) Lumbar Assessment Lumbar Assessment: Exceptions to [facility] (sits in posterior pelvic tilt) Postural Control Postural Control: Deficits on evaluation Righting Reactions: delayed  Balance Balance Balance Assessed:  Yes Static Sitting Balance Static Sitting - Level of Assistance: 5: Stand by assistance Dynamic Sitting Balance Dynamic Sitting - Level of Assistance: 3: Mod  assist Sitting balance - Comments: LOB R/L when reaching within BOS Static Standing Balance Static Standing - Level of Assistance: Not tested (comment) Dynamic Standing Balance Dynamic Standing - Level of Assistance: Not tested (comment) Extremity Assessment  RUE Assessment RUE Assessment: Exceptions to [facility] (shoulder flexion grossly 60* against gravity, strength grossly 3-/5) LUE Assessment LUE Assessment: Exceptions to [facility] (shoulder flexion grossly 50* can achieve 120 with abduction, strength grossly 3-/5) RLE Assessment RLE Assessment: Exceptions to [facility] (mild putting edeam R LL) RLE Strength RLE Overall Strength Comments: grossly in sitting: hip flex 2-/5; hip adduction 3/5, abduction 3+/5,knee ext 2+/5, ankle DF 2+/5 LLE Assessment LLE Assessment: Exceptions to [facility] (wound vac L dorsal foot and lower leg) LLE Strength LLE Overall Strength Comments: grossly in sitting: hip flex 2-?5, hip abduction 3-/5, knee ext 2+/5, ankle DF 2+/5   See Function Navigator for Current Functional Status.   Refer to Care Plan for Long Term Goals  Recommendations for other services: Other: SLP due to ST memory deficits  Discharge Criteria: Patient will be discharged from PT if patient refuses treatment 3 consecutive times without medical reason, if treatment goals not met, if there is a change in medical status, if patient makes no progress towards goals or if patient is discharged from hospital.  The above assessment, treatment plan, treatment alternatives and goals were discussed and mutually agreed upon: by patient  Launa Goedken 06/07/2017, 3:32 PM

## 2017-06-07 NOTE — Progress Notes (Signed)
[facility] PHYSICAL MEDICINE & REHABILITATION     PROGRESS NOTE  Subjective/Complaints:  Pt seen laying in bed this Am.She slept well overnight.  She has questions about her prevalon boots.  She is looking forward to restarting therapies.   ROS: Denies CP, SOB, N/V/D.  Objective: Vital Signs: Blood pressure 105/76, pulse 89, temperature 97.7 F (36.5 C), temperature source Oral, resp. rate 18, weight 71 kg (156 lb 8.4 oz), SpO2 99 %. No results found.  Recent Labs  06/05/17 0845  WBC 7.8  HGB 9.0*  HCT 29.4*  PLT 333    Recent Labs  06/04/17 1431 06/05/17 0845  NA 136 136  K 4.3 4.7  CL 101 100*  GLUCOSE 131* 100*  BUN 27* 35*  CREATININE 2.98* 3.56*  CALCIUM 8.8* 8.7*   CBG (last 3)  No results for input(s): GLUCAP in the last 72 hours.  Wt Readings from Last 3 Encounters:  06/07/17 71 kg (156 lb 8.4 oz)  06/05/17 72.5 kg (159 lb 13.3 oz)  06/02/17 64.5 kg (142 lb 3.2 oz)    Physical Exam:  BP 105/76 (BP Location: Right Arm)   Pulse 89   Temp 97.7 F (36.5 C) (Oral)   Resp 18   Wt 71 kg (156 lb 8.4 oz)   SpO2 99%   BMI 26.05 kg/m  Constitutional: She appears well-developed and well-nourished. No distress.  HENT: Normocephalic and atraumatic.  Eyes: EOMI. No discharge.  Cardiovascular: An irregularly irregular rhythm present. Respiratory: Effort normal and breath sounds normal.  GI: Bowel sounds are normal. She exhibits no distension. Musculoskeletal: She exhibits no edema or tenderness.  Neurological: She is alert and oriented.  Motor: B/l UE: 4+/5 proximal to distal B/l LE: HF 3-/5, KE 3/5, ADF/PF 3+/5  Skin: Skin is warm and dry. She is not diaphoretic. Right foot with healed abrasions. Left shin/foot +VAC.   Psychiatric: She has a normal mood and affect. Her behavior is normal. Judgment and thought content normal.    Assessment/Plan: 1. 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 Physiatrist is providing close team supervision and 24 hour management of active medical problems listed below. Physiatrist and rehab team continue to assess barriers to discharge/monitor patient progress toward functional and medical goals.  Function:  Bathing Bathing position      Bathing parts      Bathing assist        Upper Body Dressing/Undressing Upper body dressing                    Upper body assist        Lower Body Dressing/Undressing Lower body dressing                                  Lower body assist        Toileting Toileting          Toileting assist     Transfers Chair/bed transfer             Locomotion Ambulation           Wheelchair          Cognition Comprehension    Expression    Social Interaction    Problem Solving    Memory      Medical Problem List and Plan: 1.  LE>UE weakness, difficulty  with mobility and transfers secondary to CIM.  Begin CIR 2.  DVT Prophylaxis/Anticoagulation: Pharmaceutical: Coumadin--question transition back to Eliquis  INR therapeutic on 8/29 3. Pain Management: tylenol prn  4. Mood: Remains motivated. LCSW to follow for support.  5. Neuropsych: This patient is not fully capable of making decisions on her own behalf. 6. Skin/Wound Care: Maintain adequate nutritional status to help promote healing. VAC dressing changes per plastics.  7. Fluids/Electrolytes/Nutrition: Monitor I/Os. Continue megace to help with appetite. On cardiac diet to help with food choices--add 1200 cc/FR 8. A fib with RVR: To continue amiodarone at 400 mg bid and metoprolol 50 mg bid.  9. CKD, now dialysis dependent: AVG postponed for now. HD dependent--Tue/Thu/Sat schedule at end of day to help with tolerance of therapy. 10. Hypotension: On amiodarone 10 mg tid.  Monitor with increased mobility 11. Anemia of chronic disease: IV iron for iron deficiency. On aranesp  weekly.  Labs with HD 12. Left foot wound: Wound VAC over Acell for healing--dressing changes weekly by plastics.  13. Anxiety/depression: Team to provide ego support.  Continue Seroquel and Zoloft.  14. Constipation: Continue miralax and linzess. Increase senna S to bid.    LOS (Days) 1 A FACE TO FACE EVALUATION WAS PERFORMED  [name] [name] 06/07/2017 8:34 AM

## 2017-06-07 NOTE — Progress Notes (Signed)
ANTICOAGULATION CONSULT NOTE - Follow Up Consult  Pharmacy Consult for Coumadin Indication: atrial fibrillation  No Known Allergies  Patient Measurements: Weight: 156 lb 8.4 oz (71 kg)  Vital Signs: Temp: 98.4 F (36.9 C) (08/30 1351) Temp Source: Oral (08/30 1351) BP: 105/64 (08/30 1351) Pulse Rate: 88 (08/30 1351)  Labs:  Recent Labs  06/04/17 1431  06/05/17 0500 06/05/17 0845 06/05/17 1425 06/06/17 0311 06/07/17 1014  HGB  --   --   --  9.0*  --   --  10.0*  HCT  --   --   --  29.4*  --   --  32.5*  PLT  --   --   --  333  --   --  395  LABPROT  --   < > 16.3*  --  36.2* 29.9*  --   INR  --   < > 1.30  --  3.67 2.88  --   CREATININE 2.98*  --   --  3.56*  --   --  3.76*  < > = values in this interval not displayed.  Estimated Creatinine Clearance: 14.7 mL/min (A) (by C-G formula based on SCr of 3.76 mg/dL (H)).  Assessment: 66 yo female with Afib on apixaban PTA, now on coumadin.  Originally holding coumadin for AVG postponed for now placement, AVG postponed for now, likely will be done as outpatient. INR 2.88 yesterday, INR not done yet for today. CBC stable. Noted INR increased quickly on 2.5-5mg /day, amiodarone dose has been increased. So will restart with lower dose.  Plan:  Coumadin 1mg  po x 1 STAT INR, will f/u when result is back and adjust as needed. Daily PT/INR  Maryanna Shape, PharmD, BCPS  Clinical Pharmacist  Pager: [phone]

## 2017-06-07 NOTE — Progress Notes (Signed)
[name], MD Physician Signed Physical Medicine and Rehabilitation  PMR Pre-admission Date of Service: 06/05/2017 1:54 PM  Related encounter: Admission (Discharged) from 06/03/2017 in [facility]       [] Hide copied text PMR Admission Coordinator Pre-Admission Assessment  Patient: [name] is an 66 y.o., female MRN: [id number] DOB: July 22, 1951 Height: 5\' 5"  (165.1 cm) Weight: 72.5 kg (159 lb 13.3 oz)                                                                                                                                                  Insurance Information HMO:     PPO:      PCP:      IPA:      80/20:      OTHER: Georgina Pillion PRIMARY: UHC Medicare       Policy#: [id number]      Subscriber: Self CM Name: Vevelyn Royals       Phone#: [phone]     Fax#: [phone] Pre-Cert#: [id number]      Employer: Retired  Benefits:  Phone #: Verified online     Name: Playita Cortada. Date: 02/06/17     Deduct: $0      Out of Pocket Max: $4000      Life Max: N/A CIR: $160 a day, days 1-10; $0 a day, days 11+      SNF: $0 a day, days 1-20; $50 a day, days 21-100 Outpatient: PT/OT     Co-Pay: $20 per visit  Home Health: 100%      Co-Pay: None DME: 80%     Co-Pay: 20% Providers: In-network  SECONDARY: None      Policy#:       Subscriber:  CM Name:       Phone#:      Fax#:  Pre-Cert#:       Employer:  Benefits:  Phone #:      Name:  Eff. Date:      Deduct:       Out of Pocket Max:       Life Max:  CIR:       SNF:  Outpatient:      Co-Pay:  Home Health:       Co-Pay:  DME:      Co-Pay:   Medicaid Application Date:       Case Manager:  Disability Application Date:       Case Worker:   Emergency Contact Information        Contact Information    Name Relation Home Work Mobile   Limbach,[name] Spouse [phone]       Current Medical History  Patient Admitting Diagnosis: Critical illness myopathy   History of Present Illness: [name]  an 66 y.o.female with history of HTN, A fib-on pradaxa who was admitted  on [facility] on 04/08/17 due to BLE cellulitis with difficulty walking. She was found to be septic with leucocytosis and hypotensive requiring fluid bolus and pressors. Per records, she has not had any meds X 3 months and had two week history of DOE, BLE edema with fatigue and weakness. She was started on broad spectrum antibiotics and was noted to be volume overloaded with right pleural effusion. CRRT initiated and hospital course significant for PEA arrest 04/11/17, ARDS, anuria requiring IJ placement by vascular surgery as now HD dependent, shocked liver with abnormal LFTs, thrombocytopenia, ongoing issues with hypotension and tachycardia. She developed BUE edema and dopplers done revealing "occlusive deep vein thrombosis in the left cephalic vein extending from the mid humeral region down towards the elbow". Pressors weaned off and ProAmatine added to help with BP support. She continues on amiodarone and Cardizem for HR control and Dr. [name] recommended coumadin or low dose Eliquis due to A fib/stroke prophylaxis. 2 D echo revealed EF 25-30% with mild concentric hypertrophy, moderate AVR, moderate TVR and mild MVR. Serologic work up negative and limited hope for renal recovery per nephrology--IV albumin being used during HD treatment prn. 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 Therapy working on pre-gait activity and CIR recommended due to substantial deficits in mobility and ability to carry out ADL tasks. She was admitted to rehab 7/30/2018for inpatient therapies to consist of PT, ST and OT at least three hours five days a week. Past admission physiatrist, therapy team and rehab RN have worked together to provide customized collaborative inpatient rehab.  On 06/03/17 patient became tachycardic from dialysis with vitals being BP 113/ 97 HR sustaining at 140's to 160's and as a result was  transferred back to acute for closer medical monitoring with telemetry and a cardiology consult.  06/06/18 patient medically stable after medication adjustments and as a result was re-admitted to complete her rehabilitation.     Past Medical History      Past Medical History:  Diagnosis Date  . A-fib ([place])    on pradaxa  . Chronic kidney disease   . Dysrhythmia   . Hypertension   . Hypothyroidism   . Thyroid disease     Family History  family history includes AAA (abdominal aortic aneurysm) in her mother.  Prior Rehab/Hospitalizations:  Has the patient had major surgery during 100 days prior to admission? No  Current Medications   Current Facility-Administered Medications:  .  acetaminophen (TYLENOL) tablet 650 mg, 650 mg, Oral, Q6H PRN, Etta Quill, DO .  acetaminophen (TYLENOL) tablet 650 mg, 650 mg, Oral, Q4H PRN, Etta Quill, DO .  ALPRAZolam Duanne Moron) tablet 0.25 mg, 0.25 mg, Oral, TID PRN, Rama, Venetia Maxon, MD, 0.25 mg at 06/04/17 2149 .  amiodarone (PACERONE) tablet 400 mg, 400 mg, Oral, BID, Radford Pax, Traci R, MD, 400 mg at 06/06/17 0817 .  bisacodyl (DULCOLAX) suppository 10 mg, 10 mg, Rectal, Daily PRN, Etta Quill, DO .  [START ON 06/07/2017] Darbepoetin Alfa (ARANESP) injection 60 mcg, 60 mcg, Intravenous, Q Thu-HD, Elmarie Shiley, MD .  famotidine (PEPCID) tablet 20 mg, 20 mg, Oral, QHS, Gardner, Jared M, DO, 20 mg at 06/05/17 2157 .  feeding supplement (PRO-STAT SUGAR FREE 64) liquid 30 mL, 30 mL, Oral, BID, Rama, Christina P, MD .  ferric gluconate (NULECIT) 125 mg in sodium chloride 0.9 % 100 mL IVPB, 125 mg, Intravenous, Q T,Th,Sa-HD, Elmarie Shiley, MD, Stopped at 06/05/17 1545 .  Gerhardt's butt cream, , Topical, TID, Rama, Christina P, MD .  hydrocerin (EUCERIN) cream, , Topical, BID, Rama, Christina P, MD .  ipratropium-albuterol (DUONEB) 0.5-2.5 (3) MG/3ML nebulizer solution 3 mL, 3 mL, Nebulization, Q4H PRN, Alcario Drought, Jared M, DO .   levothyroxine (SYNTHROID, LEVOTHROID) tablet 100 mcg, 100 mcg, Oral, QAC breakfast, Rama, Venetia Maxon, MD, 100 mcg at 06/06/17 8144 .  linaclotide (LINZESS) capsule 145 mcg, 145 mcg, Oral, QAC breakfast, Rama, Venetia Maxon, MD, 145 mcg at 06/06/17 [phone] .  megestrol (MEGACE) tablet 40 mg, 40 mg, Oral, BID, Rama, Venetia Maxon, MD, 40 mg at 06/06/17 0818 .  metoprolol tartrate (LOPRESSOR) tablet 50 mg, 50 mg, Oral, BID, Lelon Perla, MD, 50 mg at 06/06/17 0817 .  midodrine (PROAMATINE) tablet 10 mg, 10 mg, Oral, TID WC, Rama, Venetia Maxon, MD, 10 mg at 06/06/17 1215 .  multivitamin (RENA-VIT) tablet 1 tablet, 1 tablet, Oral, QHS, Etta Quill, DO, 1 tablet at 06/05/17 2157 .  ondansetron (ZOFRAN) injection 4 mg, 4 mg, Intravenous, Q6H PRN, Alcario Drought, Jared M, DO .  ondansetron [facility]) injection 4 mg, 4 mg, Intravenous, Q6H PRN, Alcario Drought, Jared M, DO .  polyethylene glycol (MIRALAX / GLYCOLAX) packet 17 g, 17 g, Oral, BID, Rama, Venetia Maxon, MD, 17 g at 06/06/17 1215 .  QUEtiapine (SEROQUEL) tablet 25 mg, 25 mg, Oral, QHS, Gardner, Jared M, DO, 25 mg at 06/05/17 2157 .  senna-docusate (Senokot-S) tablet 2 tablet, 2 tablet, Oral, QHS, Etta Quill, DO, 2 tablet at 06/05/17 2157 .  sertraline (ZOLOFT) tablet 25 mg, 25 mg, Oral, QHS, Gardner, Jared M, DO, 25 mg at 06/05/17 2157 .  sucroferric oxyhydroxide (VELPHORO) chewable tablet 500 mg, 500 mg, Oral, TID WC, Elmarie Shiley, MD, 500 mg at 06/06/17 1215 .  Warfarin - Pharmacist Dosing Inpatient, , Does not apply, q1800, Laren Everts, RPH, Stopped at 06/04/17 1623  Patients Current Diet: Diet Heart Room service appropriate? Yes; Fluid consistency: Thin  Precautions / Restrictions Precautions Precautions: Fall Precaution Comments: R IJ perm cath; wound vac LLE Restrictions Weight Bearing Restrictions: No   Has the patient had 2 or more falls or a fall with injury in the past year?No  Prior Activity Level Community (5-7x/wk): Prior to  admission patient was fully independent and active.  She enjoyed shopping, the beach, and visiting friends and family.   Home Assistive Devices / Equipment Home Assistive Devices/Equipment: None Home Equipment: None  Prior Device Use: Indicate devices/aids used by the patient prior to current illness, exacerbation or injury? None of the above  Prior Functional Level Prior Function Level of Independence: Independent Comments: Independent with ADLs, enjoys household tasks  Self Care: Did the patient need help bathing, dressing, using the toilet or eating? Independent  Indoor Mobility: Did the patient need assistance with walking from room to room (with or without device)? Independent  Stairs: Did the patient need assistance with internal or external stairs (with or without device)? Independent  Functional Cognition: Did the patient need help planning regular tasks such as shopping or remembering to take medications? Independent  Current Functional Level Cognition  Overall Cognitive Status: Within Functional Limits for tasks assessed Orientation Level: Oriented X4    Extremity Assessment (includes Sensation/Coordination)  Upper Extremity Assessment: RUE deficits/detail, LUE deficits/detail RUE Deficits / Details: R shoulder weakness. Able to complete FF to @ 60; painfree ROM; capular weakness. ? RTC insufficiency RUE Coordination: decreased gross motor LUE Deficits / Details: generalized weakness  Lower Extremity Assessment: Defer  to PT evaluation RLE Deficits / Details: AAROM WFL, strength hip flexion 2-/5, knee extension 3-/5, ankle DF 3+/5 LLE Deficits / Details: AAROM WFL, strength hip flexion 1+/5, knee extension 2+/5, ankle DF 3/5    ADLs  Overall ADL's : Needs assistance/impaired Eating/Feeding: Modified independent, Sitting Grooming: Sitting, Wash/dry hands, Wash/dry face, Oral care, Set up Grooming Details (indicate cue type and reason): Pt tolerated sitting  EOB for 25 minutes today with supervision and set up to wash hands/face and brush teeth. Pt demonstrating continued improvement in overall strength and activity tolerance for self care tasks.  Upper Body Bathing: Minimal assistance, Sitting Lower Body Bathing: Bed level, Moderate assistance Upper Body Dressing : Minimal assistance, Sitting Lower Body Dressing: Moderate assistance, Sitting/lateral leans Functional mobility during ADLs: +2 for physical assistance, Maximal assistance    Mobility  Overal bed mobility: Needs Assistance Bed Mobility: Rolling, Sidelying to Sit Rolling: Min assist Sidelying to sit: Mod assist General bed mobility comments: to roll assist to bend L knee, then pt able to reach for railing, assist L LE off bed and to lift trunk upright    Transfers  Overall transfer level: Needs assistance Equipment used: 2 person hand held assist Transfers: Sit to/from Stand, Google Transfers Sit to Stand: Mod assist, +2 physical assistance, Max assist Squat pivot transfers: Max assist  Lateral/Scoot Transfers: Mod assist General transfer comment: +2 for initial sit to stand, then stood with +1 max A and second person for hygiene; then squat pivot to recliner max A utilizing pad under pt    Ambulation / Gait / Stairs / Wheelchair Mobility       Posture / Balance Dynamic Sitting Balance Sitting balance - Comments: EOB static sitting unaided Balance Overall balance assessment: Needs assistance Sitting-balance support: Single extremity supported, Feet supported Sitting balance-Leahy Scale: Good Sitting balance - Comments: EOB static sitting unaided Standing balance-Leahy Scale: Poor Standing balance comment: +1 mod A for support to stand for hygiene with pt able to take weight on legs and holding on for balance    Special needs/care consideration BiPAP/CPAP: No CPM: No Continuous Drip IV: No Dialysis: Yes, new since this hospital course        Days: T,Th,Sat at  [facility] in [place] 1st sitting. Coordination contact: Elvera Bicker [phone] Life Vest: No Oxygen: No Special Bed: No Trach Size: No Wound Vac (area): Yes, per Plastic Surgeon note from 8/28: Left foot/lower leg VAC functioning well. Dressing removed and Acell partially incorporated. No signs of infection or leg edema. VAC dressing was reapplied over Adaptic layer and therapy resumed at 125 mm Hg continuous therapy. Will need another VAC dressing change next week.         Skin: Dry and flaky; Cellulitis and abrasion right foot and ankle, Bruising to back and sacrum with moisture associated skin damage to sacrum and coccyx                                Bowel mgmt: Continent, 06/03/17  Bladder mgmt: Continent  Diabetic mgmt: No     Previous Home Environment Living Arrangements: Spouse/significant other  Lives With: Spouse Available Help at Discharge: Family, Available 24 hours/day Type of Home: House Home Layout: One level Home Access: Stairs to enter Entrance Stairs-Rails: Right, Left, Can reach both Entrance Stairs-Number of Steps: 4 Bathroom Shower/Tub: Tub/shower unit, Multimedia programmer: Standard Bathroom Accessibility: Yes How Accessible: Accessible via walker Home Care Services:  No Additional Comments: N/A  Discharge Living Setting Plans for Discharge Living Setting: Patient's home, Lives with (comment) (Spouse) Type of Home at Discharge: House Discharge Home Layout: One level Discharge Home Access: Stairs to enter Entrance Stairs-Rails: Right, Left, Can reach both Entrance Stairs-Number of Steps: 3-4 Discharge Bathroom Shower/Tub: Tub/shower unit, Walk-in shower Discharge Bathroom Toilet: Standard Discharge Bathroom Accessibility: Yes How Accessible: Accessible via walker Does the patient have any problems obtaining your medications?: No  Social/Family/Support Systems Patient Roles: Spouse, Parent Contact Information: Spouse Myliyah Rebuck  [phone] Anticipated Caregiver: Spouse  Anticipated Caregiver's Contact Information: see above Ability/Limitations of Caregiver: he works part time but will arrange care/provide 24/7 Caregiver Availability: 24/7 Discharge Plan Discussed with Primary Caregiver: Yes Is Caregiver In Agreement with Plan?: Yes Does Caregiver/Family have Issues with Lodging/Transportation while Pt is in Rehab?: No  Goals/Additional Needs Patient/Family Goal for Rehab: PT/OT Min A Expected length of stay: ~7 days  Cultural Considerations: Christian  Dietary Needs: Renal diet restrictions  Equipment Needs: TBD Special Service Needs: New HD patient and will need education on process for transition to HD outpatient schedule   Additional Information: Patient and spouse are from [facility] and originally from [facility] Pt/Family Agrees to Admission and willing to participate: Yes Program Orientation Provided & Reviewed with Pt/Caregiver Including Roles  & Responsibilities: Yes Additional Information Needs: Since this most recent acute admission patient with cardiology following and plans for hopeful avf placement 8/29 Information Needs to be Provided By: Medical team   Barriers to Discharge: Hemodialysis   Decrease burden of Care through IP rehab admission: No  Possible need for SNF placement upon discharge: No  Patient Condition: Patient known to our service from a previous admission. She was admitted to rehab 7/30/2018for inpatient therapies to consist of PT, ST and OT at least three hours five days a week. Past admission physiatrist, therapy team and rehab RN have worked together to provide customized collaborative inpatient rehab. However, due to tachycardia that developed from dialysis she was admitted back to acute for close medical management with cardiology and telemetry. Following medication changes she is again medically stable with continued documented therapy needs.  As a result, she will be re-admitted  today to complete her rehabilitation course with coordinated care and three hours of skilled therapy 5 out of 7 days of the week.    Preadmission Screen Completed By:  Gunnar Fusi, 06/06/2017 2:45 PM ______________________________________________________________________   Discussed status with Dr. Posey Pronto on 06/06/17 at 1445 and received telephone approval for admission today.  Admission Coordinator:  Gunnar Fusi, time 1445/Date 06/06/17       Revision History

## 2017-06-07 NOTE — Care Management Note (Signed)
[facility] Individual Statement of Services  Patient Name:  [name]  Date:  06/07/2017  Welcome to the [facility].  Our goal is to provide you with an individualized program based on your diagnosis and situation, designed to meet your specific needs.  With this comprehensive rehabilitation program, you will be expected to participate in at least 3 hours of rehabilitation therapies Monday-Friday, with modified therapy programming on the weekends.  Your rehabilitation program will include the following services:  Physical Therapy (PT), Occupational Therapy (OT), 24 hour per day rehabilitation nursing, Therapeutic Recreaction (TR), Neuropsychology, Case Management (Social Worker), Rehabilitation Medicine, Nutrition Services and Pharmacy Services  Weekly team conferences will be held on Wednesday to discuss your progress.  Your Social Worker will talk with you frequently to get your input and to update you on team discussions.  Team conferences with you and your family in attendance may also be held.  Expected length of stay: 10-14 days days  Overall anticipated outcome: min assist wheelchair level  Depending on your progress and recovery, your program may change. Your Social Worker will coordinate services and will keep you informed of any changes. Your Social Worker's name and contact numbers are listed  below.  The following services may also be recommended but are not provided by the [facility] will be made to provide these services after discharge if needed.  Arrangements include referral to agencies that provide these services.  Your insurance has been verified to be:  UHC-Medicare Your primary doctor is:  [facility]  Pertinent information will be shared with your doctor and your insurance company.  Social  Worker:  Ovidio Kin, [place] or ([phone]  Information discussed with and copy given to patient by: [name], 06/07/2017, 8:54 AM

## 2017-06-07 NOTE — Progress Notes (Signed)
assisted with cleaning after  Massive diarrhea x two. Sacral foam changed. Buttocks with excoriated fatty tissue. Patches of skin sloughing off for several patches with.  Largest purple spot of 1 cm. Several smaller, larger than petechiae...small amount of frank bleeding.

## 2017-06-07 NOTE — Progress Notes (Signed)
Physical Therapy Note  Patient Details  Name: Rylan Kaufmann MRN: [id number] Date of Birth: Apr 03, 1951 Today's Date: 06/07/2017    Time: 1500-1530 30 minutes  1:1 No c/o pain.  Session focused mostly on bed mobility and rolling for pt to get on bed pan and perform hygiene.  Pt performs rolling with assist to flex bilat knees, mod A for bridging in bed, total A to scoot up in bed with use of bed rails.  Supine <> sit with max A x 2 during session.  Sit to stand with max A, cues for upright trunk and UE placement on RW.  Pt able to perform gait with +2 for safety 5 steps fwd/5 steps backward. Pt continues with incontinence of bowel. Pt left on bedpan with needs at hand, RN notified.   Noora Locascio 06/07/2017, 3:32 PM

## 2017-06-07 NOTE — Progress Notes (Signed)
Shannon Arn, MD Physician Signed Physical Medicine and Rehabilitation  PMR Pre-admission Date of Service: 06/05/2017 1:54 PM  Related encounter: Admission (Discharged) from 06/03/2017 in [place]       [] Hide copied text PMR Admission Coordinator Pre-Admission Assessment  Patient: [name] is an 66 y.o., female MRN: [id number] DOB: 1951-08-19 Height: 5\' 5"  (165.1 cm) Weight: 72.5 kg (159 lb 13.3 oz)                                                                                                                                                  Insurance Information HMO:     PPO:      PCP:      IPA:      80/20:      OTHER: [name] PRIMARY: [employer] Medicare       Policy#: [id number]      Subscriber: Self CM Name: [name]       Phone#: [phone]     Fax#: [phone] Pre-Cert#: [id number]      Employer: Retired  Benefits:  Phone #: Verified online     Name: [name]. Date: 02/06/17     Deduct: $0      Out of Pocket Max: $4000      Life Max: N/A CIR: $160 a day, days 1-10; $0 a day, days 11+      SNF: $0 a day, days 1-20; $50 a day, days 21-100 Outpatient: PT/OT     Co-Pay: $20 per visit  Home Health: 100%      Co-Pay: None DME: 80%     Co-Pay: 20% Providers: In-network  SECONDARY: None      Policy#:       Subscriber:  CM Name:       Phone#:      Fax#:  Pre-Cert#:       Employer:  Benefits:  Phone #:      Name:  Eff. Date:      Deduct:       Out of Pocket Max:       Life Max:  CIR:       SNF:  Outpatient:      Co-Pay:  Home Health:       Co-Pay:  DME:      Co-Pay:   Medicaid Application Date:       Case Manager:  Disability Application Date:       Case Worker:   Emergency Contact Information        Contact Information    Name Relation Home Work Mobile   [name] Spouse [phone]       Current Medical History  Patient Admitting Diagnosis: Critical illness myopathy   History of Present Illness: [name]  an 66 y.o.female with history of HTN, A fib-on pradaxa who was admitted  on [facility] on 04/08/17 due to BLE cellulitis with difficulty walking. She was found to be septic with leucocytosis and hypotensive requiring fluid bolus and pressors. Per records, she has not had any meds X 3 months and had two week history of DOE, BLE edema with fatigue and weakness. She was started on broad spectrum antibiotics and was noted to be volume overloaded with right pleural effusion. CRRT initiated and hospital course significant for PEA arrest 04/11/17, ARDS, anuria requiring IJ placement by vascular surgery as now HD dependent, shocked liver with abnormal LFTs, thrombocytopenia, ongoing issues with hypotension and tachycardia. She developed BUE edema and dopplers done revealing "occlusive deep vein thrombosis in the left cephalic vein extending from the mid humeral region down towards the elbow". Pressors weaned off and ProAmatine added to help with BP support. She continues on amiodarone and Cardizem for HR control and Dr. [name] recommended coumadin or low dose Eliquis due to A fib/stroke prophylaxis. 2 D echo revealed EF 25-30% with mild concentric hypertrophy, moderate AVR, moderate TVR and mild MVR. Serologic work up negative and limited hope for renal recovery per nephrology--IV albumin being used during HD treatment prn. 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 Therapy working on pre-gait activity and CIR recommended due to substantial deficits in mobility and ability to carry out ADL tasks. She was admitted to rehab 7/30/2018for inpatient therapies to consist of PT, ST and OT at least three hours five days a week. Past admission physiatrist, therapy team and rehab RN have worked together to provide customized collaborative inpatient rehab.  On 06/03/17 patient became tachycardic from dialysis with vitals being BP 113/ 97 HR sustaining at 140's to 160's and as a result was  transferred back to acute for closer medical monitoring with telemetry and a cardiology consult.  06/06/18 patient medically stable after medication adjustments and as a result was re-admitted to complete her rehabilitation.     Past Medical History      Past Medical History:  Diagnosis Date  . A-fib ([place])    on pradaxa  . Chronic kidney disease   . Dysrhythmia   . Hypertension   . Hypothyroidism   . Thyroid disease     Family History  family history includes AAA (abdominal aortic aneurysm) in her mother.  Prior Rehab/Hospitalizations:  Has the patient had major surgery during 100 days prior to admission? No  Current Medications   Current Facility-Administered Medications:  .  acetaminophen (TYLENOL) tablet 650 mg, 650 mg, Oral, Q6H PRN, Etta Quill, DO .  acetaminophen (TYLENOL) tablet 650 mg, 650 mg, Oral, Q4H PRN, Etta Quill, DO .  ALPRAZolam Duanne Moron) tablet 0.25 mg, 0.25 mg, Oral, TID PRN, Rama, Venetia Maxon, MD, 0.25 mg at 06/04/17 2149 .  amiodarone (PACERONE) tablet 400 mg, 400 mg, Oral, BID, Radford Pax, Traci R, MD, 400 mg at 06/06/17 0817 .  bisacodyl (DULCOLAX) suppository 10 mg, 10 mg, Rectal, Daily PRN, Etta Quill, DO .  [START ON 06/07/2017] Darbepoetin Alfa (ARANESP) injection 60 mcg, 60 mcg, Intravenous, Q Thu-HD, Elmarie Shiley, MD .  famotidine (PEPCID) tablet 20 mg, 20 mg, Oral, QHS, Gardner, Jared M, DO, 20 mg at 06/05/17 2157 .  feeding supplement (PRO-STAT SUGAR FREE 64) liquid 30 mL, 30 mL, Oral, BID, Rama, Christina P, MD .  ferric gluconate (NULECIT) 125 mg in sodium chloride 0.9 % 100 mL IVPB, 125 mg, Intravenous, Q T,Th,Sa-HD, Elmarie Shiley, MD, Stopped at 06/05/17 1545 .  Gerhardt's butt cream, , Topical, TID, Rama, Christina P, MD .  hydrocerin (EUCERIN) cream, , Topical, BID, Rama, Christina P, MD .  ipratropium-albuterol (DUONEB) 0.5-2.5 (3) MG/3ML nebulizer solution 3 mL, 3 mL, Nebulization, Q4H PRN, Alcario Drought, Jared M, DO .   levothyroxine (SYNTHROID, LEVOTHROID) tablet 100 mcg, 100 mcg, Oral, QAC breakfast, Rama, Venetia Maxon, MD, 100 mcg at 06/06/17 3329 .  linaclotide (LINZESS) capsule 145 mcg, 145 mcg, Oral, QAC breakfast, Rama, Venetia Maxon, MD, 145 mcg at 06/06/17 [phone] .  megestrol (MEGACE) tablet 40 mg, 40 mg, Oral, BID, Rama, Venetia Maxon, MD, 40 mg at 06/06/17 0818 .  metoprolol tartrate (LOPRESSOR) tablet 50 mg, 50 mg, Oral, BID, Lelon Perla, MD, 50 mg at 06/06/17 0817 .  midodrine (PROAMATINE) tablet 10 mg, 10 mg, Oral, TID WC, Rama, Venetia Maxon, MD, 10 mg at 06/06/17 1215 .  multivitamin (RENA-VIT) tablet 1 tablet, 1 tablet, Oral, QHS, Etta Quill, DO, 1 tablet at 06/05/17 2157 .  ondansetron (ZOFRAN) injection 4 mg, 4 mg, Intravenous, Q6H PRN, Alcario Drought, Jared M, DO .  ondansetron [facility]) injection 4 mg, 4 mg, Intravenous, Q6H PRN, Alcario Drought, Jared M, DO .  polyethylene glycol (MIRALAX / GLYCOLAX) packet 17 g, 17 g, Oral, BID, Rama, Venetia Maxon, MD, 17 g at 06/06/17 1215 .  QUEtiapine (SEROQUEL) tablet 25 mg, 25 mg, Oral, QHS, Gardner, Jared M, DO, 25 mg at 06/05/17 2157 .  senna-docusate (Senokot-S) tablet 2 tablet, 2 tablet, Oral, QHS, Etta Quill, DO, 2 tablet at 06/05/17 2157 .  sertraline (ZOLOFT) tablet 25 mg, 25 mg, Oral, QHS, Gardner, Jared M, DO, 25 mg at 06/05/17 2157 .  sucroferric oxyhydroxide (VELPHORO) chewable tablet 500 mg, 500 mg, Oral, TID WC, Elmarie Shiley, MD, 500 mg at 06/06/17 1215 .  Warfarin - Pharmacist Dosing Inpatient, , Does not apply, q1800, Laren Everts, RPH, Stopped at 06/04/17 1623  Patients Current Diet: Diet Heart Room service appropriate? Yes; Fluid consistency: Thin  Precautions / Restrictions Precautions Precautions: Fall Precaution Comments: R IJ perm cath; wound vac LLE Restrictions Weight Bearing Restrictions: No   Has the patient had 2 or more falls or a fall with injury in the past year?No  Prior Activity Level Community (5-7x/wk): Prior to  admission patient was fully independent and active.  She enjoyed shopping, the beach, and visiting friends and family.   Home Assistive Devices / Equipment Home Assistive Devices/Equipment: None Home Equipment: None  Prior Device Use: Indicate devices/aids used by the patient prior to current illness, exacerbation or injury? None of the above  Prior Functional Level Prior Function Level of Independence: Independent Comments: Independent with ADLs, enjoys household tasks  Self Care: Did the patient need help bathing, dressing, using the toilet or eating? Independent  Indoor Mobility: Did the patient need assistance with walking from room to room (with or without device)? Independent  Stairs: Did the patient need assistance with internal or external stairs (with or without device)? Independent  Functional Cognition: Did the patient need help planning regular tasks such as shopping or remembering to take medications? Independent  Current Functional Level Cognition  Overall Cognitive Status: Within Functional Limits for tasks assessed Orientation Level: Oriented X4    Extremity Assessment (includes Sensation/Coordination)  Upper Extremity Assessment: RUE deficits/detail, LUE deficits/detail RUE Deficits / Details: R shoulder weakness. Able to complete FF to @ 60; painfree ROM; capular weakness. ? RTC insufficiency RUE Coordination: decreased gross motor LUE Deficits / Details: generalized weakness  Lower Extremity Assessment: Defer  to PT evaluation RLE Deficits / Details: AAROM WFL, strength hip flexion 2-/5, knee extension 3-/5, ankle DF 3+/5 LLE Deficits / Details: AAROM WFL, strength hip flexion 1+/5, knee extension 2+/5, ankle DF 3/5    ADLs  Overall ADL's : Needs assistance/impaired Eating/Feeding: Modified independent, Sitting Grooming: Sitting, Wash/dry hands, Wash/dry face, Oral care, Set up Grooming Details (indicate cue type and reason): Pt tolerated sitting  EOB for 25 minutes today with supervision and set up to wash hands/face and brush teeth. Pt demonstrating continued improvement in overall strength and activity tolerance for self care tasks.  Upper Body Bathing: Minimal assistance, Sitting Lower Body Bathing: Bed level, Moderate assistance Upper Body Dressing : Minimal assistance, Sitting Lower Body Dressing: Moderate assistance, Sitting/lateral leans Functional mobility during ADLs: +2 for physical assistance, Maximal assistance    Mobility  Overal bed mobility: Needs Assistance Bed Mobility: Rolling, Sidelying to Sit Rolling: Min assist Sidelying to sit: Mod assist General bed mobility comments: to roll assist to bend L knee, then pt able to reach for railing, assist L LE off bed and to lift trunk upright    Transfers  Overall transfer level: Needs assistance Equipment used: 2 person hand held assist Transfers: Sit to/from Stand, Google Transfers Sit to Stand: Mod assist, +2 physical assistance, Max assist Squat pivot transfers: Max assist  Lateral/Scoot Transfers: Mod assist General transfer comment: +2 for initial sit to stand, then stood with +1 max A and second person for hygiene; then squat pivot to recliner max A utilizing pad under pt    Ambulation / Gait / Stairs / Wheelchair Mobility       Posture / Balance Dynamic Sitting Balance Sitting balance - Comments: EOB static sitting unaided Balance Overall balance assessment: Needs assistance Sitting-balance support: Single extremity supported, Feet supported Sitting balance-Leahy Scale: Good Sitting balance - Comments: EOB static sitting unaided Standing balance-Leahy Scale: Poor Standing balance comment: +1 mod A for support to stand for hygiene with pt able to take weight on legs and holding on for balance    Special needs/care consideration BiPAP/CPAP: No CPM: No Continuous Drip IV: No Dialysis: Yes, new since this hospital course        Days: T,Th,Sat at  [facility] in [place] 1st sitting. Coordination contact: Elvera Bicker [phone] Life Vest: No Oxygen: No Special Bed: No Trach Size: No Wound Vac (area): Yes, per Plastic Surgeon note from 8/28: Left foot/lower leg VAC functioning well. Dressing removed and Acell partially incorporated. No signs of infection or leg edema. VAC dressing was reapplied over Adaptic layer and therapy resumed at 125 mm Hg continuous therapy. Will need another VAC dressing change next week.         Skin: Dry and flaky; Cellulitis and abrasion right foot and ankle, Bruising to back and sacrum with moisture associated skin damage to sacrum and coccyx                                Bowel mgmt: Continent, 06/03/17  Bladder mgmt: Continent  Diabetic mgmt: No     Previous Home Environment Living Arrangements: Spouse/significant other  Lives With: Spouse Available Help at Discharge: Family, Available 24 hours/day Type of Home: House Home Layout: One level Home Access: Stairs to enter Entrance Stairs-Rails: Right, Left, Can reach both Entrance Stairs-Number of Steps: 4 Bathroom Shower/Tub: Tub/shower unit, Multimedia programmer: Standard Bathroom Accessibility: Yes How Accessible: Accessible via walker Home Care Services:  No Additional Comments: N/A  Discharge Living Setting Plans for Discharge Living Setting: Patient's home, Lives with (comment) (Spouse) Type of Home at Discharge: House Discharge Home Layout: One level Discharge Home Access: Stairs to enter Entrance Stairs-Rails: Right, Left, Can reach both Entrance Stairs-Number of Steps: 3-4 Discharge Bathroom Shower/Tub: Tub/shower unit, Walk-in shower Discharge Bathroom Toilet: Standard Discharge Bathroom Accessibility: Yes How Accessible: Accessible via walker Does the patient have any problems obtaining your medications?: No  Social/Family/Support Systems Patient Roles: Spouse, Parent Contact Information: Spouse [place]  [phone] Anticipated Caregiver: Spouse  Anticipated Caregiver's Contact Information: see above Ability/Limitations of Caregiver: he works part time but will arrange care/provide 24/7 Caregiver Availability: 24/7 Discharge Plan Discussed with Primary Caregiver: Yes Is Caregiver In Agreement with Plan?: Yes Does Caregiver/Family have Issues with Lodging/Transportation while Pt is in Rehab?: No  Goals/Additional Needs Patient/Family Goal for Rehab: PT/OT Min A Expected length of stay: ~7 days  Cultural Considerations: Christian  Dietary Needs: Renal diet restrictions  Equipment Needs: TBD Special Service Needs: New HD patient and will need education on process for transition to HD outpatient schedule   Additional Information: Patient and spouse are from [facility] and originally from [facility] Pt/Family Agrees to Admission and willing to participate: Yes Program Orientation Provided & Reviewed with Pt/Caregiver Including Roles  & Responsibilities: Yes Additional Information Needs: Since this most recent acute admission patient with cardiology following and plans for hopeful avf placement 8/29 Information Needs to be Provided By: Medical team   Barriers to Discharge: Hemodialysis   Decrease burden of Care through IP rehab admission: No  Possible need for SNF placement upon discharge: No  Patient Condition: Patient known to our service from a previous admission. She was admitted to rehab 7/30/2018for inpatient therapies to consist of PT, ST and OT at least three hours five days a week. Past admission physiatrist, therapy team and rehab RN have worked together to provide customized collaborative inpatient rehab. However, due to tachycardia that developed from dialysis she was admitted back to acute for close medical management with cardiology and telemetry. Following medication changes she is again medically stable with continued documented therapy needs.  As a result, she will be re-admitted  today to complete her rehabilitation course with coordinated care and three hours of skilled therapy 5 out of 7 days of the week.    Preadmission Screen Completed By:  Gunnar Fusi, 06/06/2017 2:45 PM ______________________________________________________________________   Discussed status with Dr. Posey Pronto on 06/06/17 at 1445 and received telephone approval for admission today.  Admission Coordinator:  Gunnar Fusi, time 1445/Date 06/06/17       Revision History

## 2017-06-07 NOTE — Progress Notes (Signed)
Patient ID: [name], female   DOB: 08-Dec-1950, 66 y.o.   MRN: [id number]  [facility] Progress Note   Assessment/ Plan:   1. Chronic systolic congestive heart failure/dilated cardiomyopathy: Volume status continues to improve with ongoing hemodialysis/ultrafiltration and she continues to do better clinically. 2. ESRD: Continue dialysis on a Tuesday/Thursday/Saturday schedule with dialysis later today so as not to interfere with PT/OT planned by CIR. Awaiting confirmation with the kidney center at [facility] regarding chair time. Permanent access to be pursued as an outpatient per recommendations by vascular surgery. 3. Anemia: No overt loss but iron deficiency identified on labs-continue intravenous iron/ESA  4. CKD-MBD: PTH level at goal for ESRD and with rising calcium level-monitor phosphorous trend with current therapy. 5. Nutrition: With protein calorie malnutrition/chronic illness-continue nutritional supplementation with protein supplement/MVI. 6. Atrial fibrillation with RVR: Improving rate control with amiodarone and metoprolol- plans in place for possible TEE guided cardioversion if rate control remains difficult on beta-blocker (dose limited by blood pressure). 7. Hypotension: On midodrine, monitor with ultrafiltration and hemodialysis  Subjective:   Denies any active complaints overnight, continues to feel better.    Objective:   BP 105/76 (BP Location: Right Arm)   Pulse 89   Temp 97.7 F (36.5 C) (Oral)   Resp 18   Wt 71 kg (156 lb 8.4 oz)   SpO2 99%   BMI 26.05 kg/m   Physical Exam: JJO:[id number] resting in bed CVS: Pulse irregularly irregular and normal rate, S1 and S2 with ejection systolic murmur Resp: Diminished breath sounds over bases, anteriorly clear to auscultation Abd: Soft, flat, nontender, bowel sounds normal Ext: Trace-1+ lower extremity edema with Wound VAC left pretibial  Labs: BMET  Recent Labs Lab 05/31/17 2233 06/02/17 1729  06/03/17 0509 06/04/17 1431 06/05/17 0845 06/07/17 1014  NA 136 134* 136 136 136 136  K 3.9 3.4* 3.7 4.3 4.7 3.4*  CL 98* 98* 101 101 100* 99*  CO2 26 24 25  21* 21* 21*  GLUCOSE 86 87 114* 131* 100* 81  BUN 31* 34* 8 27* 35* 40*  CREATININE 3.32* 3.31* 1.37* 2.98* 3.56* 3.76*  CALCIUM 8.7* 8.5* 8.1* 8.8* 8.7* 8.8*  PHOS 3.9 3.8  --  4.5 5.9* 5.2*   CBC  Recent Labs Lab 05/31/17 2233 06/02/17 1730 06/05/17 0845 06/07/17 1014  WBC 7.8 9.9 7.8 2.7*  HGB 8.3* 8.9* 9.0* 10.0*  HCT 26.3* 28.3* 29.4* 32.5*  MCV 106.5* 105.6* 108.5* 108.3*  PLT 221 271 333 395   Medications:    . amiodarone  400 mg Oral BID  . darbepoetin (ARANESP) injection - DIALYSIS  60 mcg Intravenous Q Thu-HD  . famotidine  20 mg Oral QHS  . feeding supplement (PRO-STAT SUGAR FREE 64)  30 mL Oral BID  . Gerhardt's butt cream   Topical TID  . hydrocerin   Topical BID  . levothyroxine  100 mcg Oral QAC breakfast  . linaclotide  145 mcg Oral QAC breakfast  . megestrol  40 mg Oral BID  . metoprolol tartrate  50 mg Oral BID  . midodrine  10 mg Oral TID WC  . multivitamin  1 tablet Oral QHS  . polyethylene glycol  17 g Oral BID  . QUEtiapine  25 mg Oral QHS  . senna-docusate  2 tablet Oral BID  . sertraline  25 mg Oral QHS  . sucroferric oxyhydroxide  500 mg Oral TID WC  . Warfarin - Pharmacist Dosing Inpatient   Does not apply [phone]  Elmarie Shiley, MD 06/07/2017, 12:16 PM

## 2017-06-07 NOTE — Progress Notes (Signed)
Patient information reviewed and entered into eRehab system by Daiva Nakayama, RN, CRRN, [place] Coordinator.  Information including medical coding and functional independence measure will be reviewed and updated through discharge.     Per nursing patient was given "Data Collection Information Summary for Patients in Inpatient Rehabilitation Facilities with attached "Privacy Act [place] Records" upon admission.  Reviewed, patient returning to [place] after a short stay on acute hospital.

## 2017-06-07 NOTE — Evaluation (Signed)
Occupational Therapy Assessment and Plan  Patient Details  Name: Kaden Dunkel MRN: [id number] Date of Birth: 1951-07-05  OT Diagnosis: muscular wasting and disuse atrophy and muscle weakness (generalized) Rehab Potential: Rehab Potential (ACUTE ONLY): Good ELOS: 7-10 days   Today's Date: 06/07/2017 OT Individual Time: 1050-1200 OT Individual Time Calculation (min): 70 min     Problem List:  Patient Active Problem List   Diagnosis Date Noted  . Slow transit constipation   . Acute blood loss anemia   . Splinter of foot without major open wound or infection   . Dependence on renal dialysis ([place])   . Chronic kidney disease (CKD), stage IV (severe) ([place])   . Atrial fibrillation with rapid ventricular response ([place])   . Chronic anticoagulation   . Pressure injury of skin 06/04/2017  . Chronic systolic CHF (congestive heart failure) ([place]) 06/03/2017  . Atrial fibrillation with RVR ([place]) 06/02/2017  . ESRD (end stage renal disease) ([place])   . Vaginal discharge   . AKI (acute kidney injury) ([place])   . Bleeding   . A-fib ([place])   . Abnormal urinalysis   . Dependence on hemodialysis ([place])   . Ulcer of left lower extremity with fat layer exposed ([place])   . Skin ulcer of left foot with fat layer exposed ([place])   . Vaginal bleeding   . Hypoglycemia associated with diabetes ([place])   . Skin ulcer of right heel ([place])   . PAF (paroxysmal atrial fibrillation) ([place])   . Hypotension   . Leukocytosis   . Anemia of chronic disease   . Anxiety state   . Depression   . Prediabetes   . Stage 5 chronic kidney disease not on chronic dialysis ([place])   . 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 Pressure sore on buttocks 04/19/2017  . Acute renal failure (ARF) ([place])   . Cellulitis of lower extremity   . Edema extremities   . Acute respiratory failure ([place])   . Acute renal failure ([place])   . Multi-organ failure with heart failure ([place])   . Palliative care by specialist   . Goals of care,  counseling/discussion   . Septic shock ([place]) 04/08/2017    Past Medical History:  Past Medical History:  Diagnosis Date  . A-fib ([place])    on pradaxa  . Chronic kidney disease   . Dysrhythmia   . Hypertension   . Hypothyroidism   . Thyroid disease    Past Surgical History:  Past Surgical History:  Procedure Laterality Date  . APPLICATION OF A-CELL OF EXTREMITY Left 05/28/2017   Procedure: APPLICATION OF A-CELL OF LEFT LOWER EXTREMITY;  Surgeon: Wallace Going, DO;  Location: [facility];  Service: Plastics;  Laterality: Left;  . APPLICATION OF WOUND VAC Left 05/28/2017   Procedure: APPLICATION OF WOUND VAC;  Surgeon: Wallace Going, DO;  Location: [facility];  Service: Plastics;  Laterality: Left;  . DIALYSIS/PERMA CATHETER INSERTION N/A 04/26/2017   Procedure: Dialysis/Perma Catheter Insertion;  Surgeon: Algernon Huxley, MD;  Location: [facility];  Service: Cardiovascular;  Laterality: N/A;  . INCISION AND DRAINAGE OF WOUND Left 05/28/2017   Procedure: IRRIGATION AND DEBRIDEMENT LEFT LOWER LEG WOUND;  Surgeon: Wallace Going, DO;  Location: [facility];  Service: Plastics;  Laterality: Left;  . PR DEBRIDEMENT, SKIN, SUB-Q TISSUE,=<20 SQ CM  05/21/2017      . PR DEBRIDEMENT, SKIN, SUB-Q TISSUE,EACH ADD 20 SQ CM  05/21/2017      . WRIST ARTHROSCOPY  Assessment & Plan Clinical Impression: Patient is a 66 y.o. year old female with history of HTN, A fib-on pradaxa who was admitted on [facility] on 04/08/17 due to BLE cellulitis with difficulty walking. She was found to be septic with leucocytosis, hypotensive with two week history of DOE and BLE edema with cellulitis.  She was started on broad spectrum antibiotics and was noted to be volume overloaded with right pleural effusion requiring CRRT. Hospital course significant for PEA arrest 7/4, ARDS, anuria requiring IJ placement by vascular surgery as now HD dependent, shocked liver with abnormal LFTs, thrombocytopenia, ongoing issues with  hypotension and tachycardia. She developed BUE edema due to "occlusive deep vein thrombosis in the left cephalic vein extending from the mid humeral region down towards the elbow". She has had issues with A fib with RVR and placed on amiodarone and Cardizem for HR control and Dr. [name] recommended coumadin or low dose Eliquis due to A fib/stroke prophylaxis.  Serologic work up negative and she was HD dependent.  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  She was admitted to [facility] on 05/08/17 for intensive rehab program. She was noted to have BLE ulcers that have been monitored. HD has been ongoing with minimal renal recovery. She did develop vaginal bleeding and vaginal/pelvic ultrasound revealed "markedly abnormal and thickened endometrium containing a focal 3.7 cm hyperechoic mass". Gynecology was consulted and recommended outpatient follow up with biopsy. Left leg wound was debrided at bedside with minimal results therefore Dr. [name] was consulted for assistance. She was transitioned from Eliquis to coumadin and taken to OR for debridement with placement of Acell and VAC on 05/28/17.  She developed A fib with flutter and was transferred to acute for treatment on 8/26. Amiodarone and metoprolol doses were increased for rate with HR currently in 90- 110 range.  Dr. [name] was consulted for placement of AVG but INR remains therapeutic and plans for graft in 6 weeks on outpatient basis.  She was cleared to resume activity and admitted back to resume CIR program  .  Patient transferred to CIR on 06/06/2017 .    Patient currently requires mod with basic self-care skills secondary to muscle weakness and decreased standing balance, decreased postural control and decreased balance strategies.  Prior to hospitalization, patient could complete ADLs with independent .  Patient will benefit from skilled intervention to  decrease level of assist with basic self-care skills prior to discharge home with care partner.  Anticipate patient will require minimal physical assistance and follow up home health.  OT - End of Session Activity Tolerance: Tolerates 30+ min activity with multiple rests Endurance Deficit: Yes Endurance Deficit Description: required frequent rest breaks, returned to bed at end of session OT Assessment Rehab Potential (ACUTE ONLY): Good OT Barriers to Discharge: Hemodialysis;Inaccessible home environment;Wound Care OT Patient demonstrates impairments in the following area(s): Balance;Endurance;Motor;Pain;Safety;Skin Integrity OT Basic ADL's Functional Problem(s): Eating;Grooming;Bathing;Dressing;Toileting OT Transfers Functional Problem(s): Toilet;Tub/Shower OT Additional Impairment(s): Fuctional Use of Upper Extremity OT Plan OT Intensity: Minimum of 1-2 x/day, 45 to 90 minutes OT Frequency: 5 out of 7 days OT Duration/Estimated Length of Stay: 7-10 days OT Treatment/Interventions: Balance/vestibular training;Community reintegration;Discharge planning;Disease mangement/prevention;DME/adaptive equipment instruction;Functional mobility training;Neuromuscular re-education;Pain management;Patient/family education;Psychosocial support;Self Care/advanced ADL retraining;Skin care/wound managment;Therapeutic Activities;Therapeutic Exercise;UE/LE Strength taining/ROM;UE/LE Coordination activities;Wheelchair propulsion/positioning OT Self Feeding Anticipated Outcome(s): Supevision/setup OT Basic Self-Care Anticipated Outcome(s): Min assist OT Toileting Anticipated Outcome(s): Mod assist OT  Bathroom Transfers Anticipated Outcome(s): Min assist OT Recommendation Recommendations for Other Services: Therapeutic Recreation consult Therapeutic Recreation Interventions: Other (comment) Patient destination: Home Follow Up Recommendations: Home health OT;24 hour supervision/assistance Equipment Recommended:  3 in 1 bedside comode;Tub/shower bench (drop arm BSC)   Skilled Therapeutic Intervention OT eval completed with discussion of POC, ELOS, and goals.  ADL assessment completed with focus on functional transfers, lateral leans, and sit > stand during self-care tasks.  Pt received in bed reporting RN wanting pt to have BM.  Discussed recommendation to attempt toileting on BSC to allow gravity assist.  Pt required mod cues for sequencing with bed mobility, mod assist slide board transfer to drop arm BSC.  Engaged in bathing while attempting to have BM.  Utilized step stool for additional BLE support to aid in toileting, with success with aid from RN.  Pt able to lean forward with cues and min assist to allow therapist to complete hygiene.  Completed LB dressing and pulling up pants at sit > stand level with +2 for safety.  Pt able to achieve mostly upright standing with max assist of therapist and 2nd person to manage clothing.  Max assist squat pivot transfer back to bed.  Pt continues to demonstrate difficulty with problem solving and sequencing weight shifting for transitional movements.  OT Evaluation Precautions/Restrictions  Precautions Precautions: Fall Precaution Comments: R IJ perm cath; wound vac LLE Restrictions Weight Bearing Restrictions: No Pain Pain Assessment Pain Assessment: No/denies pain Home Living/Prior Functioning Home Living Family/patient expects to be discharged to:: Private residence Living Arrangements: Spouse/significant other Available Help at Discharge: Family, Available 24 hours/day Type of Home: House Home Access: Stairs to enter, Ramped entrance Technical brewer of Steps: 4 Home Layout: One level Bathroom Shower/Tub: Tub/shower unit, Multimedia programmer: Programmer, systems: Yes  Lives With: Spouse IADL History Homemaking Responsibilities: Yes Meal Prep Responsibility: Primary Laundry Responsibility: Primary Cleaning  Responsibility: Primary Shopping Responsibility: Primary Current License: Yes Education: high school  Occupation: Retired Prior Function Level of Independence: Independent with gait, Independent with transfers, Independent with basic ADLs, Independent with homemaking with ambulation  Able to Take Stairs?: Yes Driving: Yes Vocation: Retired Comments: Independent with ADLs, enjoys household tasks ADL  See Function Navigator Vision Baseline Vision/History: Wears glasses Wears Glasses: At all times Patient Visual Report: No change from baseline Vision Assessment?: No apparent visual deficits Perception  Perception: Within Functional Limits Praxis Praxis: Intact Cognition Overall Cognitive Status: Within Functional Limits for tasks assessed Arousal/Alertness: Awake/alert Orientation Level: Person;Place;Situation Person: Oriented Place: Oriented Situation: Oriented Year: 2018 Month: August Day of Week: Incorrect (Friday) Memory Impairment: Decreased recall of new information Immediate Memory Recall: Sock;Blue;Bed Memory Recall: Sock;Blue (unable to recall bed even from cue and choice of 3) Memory Recall Sock: Without Cue Memory Recall Blue: Without Cue Attention: Selective Selective Attention: Appears intact Awareness: Appears intact Safety/Judgment: Appears intact Sensation Sensation Light Touch: Appears Intact Hot/Cold: Appears Intact Proprioception: Appears Intact Coordination Gross Motor Movements are Fluid and Coordinated: No Fine Motor Movements are Fluid and Coordinated: Yes Finger Nose Finger Test: slow due to limited shoulder ROM, but fluid Extremity/Trunk Assessment RUE Assessment RUE Assessment: Exceptions to [facility] (shoulder flexion grossly 60* against gravity, strength grossly 3-/5) LUE Assessment LUE Assessment: Exceptions to [facility] (shoulder flexion grossly 50* can achieve 120 with abduction, strength grossly 3-/5)   See Function Navigator for Current  Functional Status.   Refer to Care Plan for Long Term Goals  Recommendations for other services: Neuropsych   Discharge  Criteria: Patient will be discharged from OT if patient refuses treatment 3 consecutive times without medical reason, if treatment goals not met, if there is a change in medical status, if patient makes no progress towards goals or if patient is discharged from hospital.  The above assessment, treatment plan, treatment alternatives and goals were discussed and mutually agreed upon: by patient  [name] 06/07/2017, 12:25 PM

## 2017-06-07 NOTE — Progress Notes (Signed)
ANTICOAGULATION CONSULT NOTE - Follow Up Consult  Pharmacy Consult for Coumadin > Eliquis Indication: atrial fibrillation  No Known Allergies  Patient Measurements: Weight: 156 lb 8.4 oz (71 kg)  Vital Signs: Temp: 98.4 F (36.9 C) (08/30 1351) Temp Source: Oral (08/30 1351) BP: 105/64 (08/30 1351) Pulse Rate: 88 (08/30 1351)  Labs:  Recent Labs  06/05/17 0845 06/05/17 1425 06/06/17 0311 06/07/17 1014 06/07/17 1435  HGB 9.0*  --   --  10.0*  --   HCT 29.4*  --   --  32.5*  --   PLT 333  --   --  395  --   LABPROT  --  36.2* 29.9*  --  29.8*  INR  --  3.67 2.88  --  2.86  CREATININE 3.56*  --   --  3.76*  --     Estimated Creatinine Clearance: 14.7 mL/min (A) (by C-G formula based on SCr of 3.76 mg/dL (H)).  Assessment: 66 yo female with Afib on apixaban PTA, now on coumadin.   Asked by rehab team to switch back to apixaban today.  Discussed w/ PA that Coumadin is drug of choice for ESRD, but patient and team wish to proceed with Eliquis for convenience.    Plan:  D/c Coumadin. Will start Eliquis 5 mg BID once INR < 2. (Dose should be 5 mg for ESRD unless weight < 60 kg or age > 63 yrs).  Uvaldo Rising, BCPS  Clinical Pharmacist Pager [phone]  06/07/2017 4:11 PM

## 2017-06-07 NOTE — Progress Notes (Signed)
Social Work Donata Clay, Vanetta Mulders, LCSW Social Worker Signed   Progress Notes Date of Service: 05/09/2017  5:07 PM  Related encounter: Admission (Discharged) from 05/07/2017 in [facility] copied text [place]over for attribution information Social Work  Social Work Assessment and Plan   Patient Details  Name: [name] MRN: [id number] Date of Birth: 15-Jun-1951   Today's Date: 05/09/2017   Problem List:      Patient Active Problem List    Diagnosis Date Noted  . Skin ulcer of right heel ([place])    . PAF (paroxysmal atrial fibrillation) ([place])    . Hypotension    . Leukocytosis    . Anemia of chronic disease    . Anxiety state    . Depression    . Prediabetes    . Stage 5 chronic kidney disease not on chronic dialysis ([place])    . 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 Pressure injury of skin 04/19/2017  . Acute renal failure (ARF) ([place])    . Cellulitis of lower extremity    . Edema extremities    . Acute respiratory failure ([place])    . Acute renal failure ([place])    . Multi-organ failure with heart failure ([place])    . Palliative care by specialist    . Goals of care, counseling/discussion    . Septic shock ([place]) 04/08/2017    Past Medical History:      Past Medical History:  Diagnosis Date  . A-fib ([place])      on pradaxa  . Chronic kidney disease    . Dysrhythmia    . Hypertension    . Hypothyroidism    . Thyroid disease      Past Surgical History:       Past Surgical History:  Procedure Laterality Date  . DIALYSIS/PERMA CATHETER INSERTION N/A 04/26/2017    Procedure: Dialysis/Perma Catheter Insertion;  Surgeon: Algernon Huxley, MD;  Location: [facility];  Service: Cardiovascular;  Laterality: N/A;  . WRIST ARTHROSCOPY        Social History:  reports that she has never smoked. She has never used smokeless tobacco. She reports that she does not drink alcohol or use drugs.   Family / Support Systems Marital Status: Married How  Long?: 22 yrs Patient Roles: Spouse, Parent Spouse/Significant Other: [name] @ (C) [phone] Anticipated Caregiver: Spouse  Ability/Limitations of Caregiver: he works part time but will arrange care/provide 24/7 Caregiver Availability: 24/7 Family Dynamics: [employer] very encouraging and supportive of pt.  States, "This whole thing shook my world.  I'm not going to let anything happen to her."   Social History Preferred language: English Religion: Baptist Cultural Background: NA Education: college Read: Yes Write: Yes Employment Status: Retired Freight forwarder Issues: None Guardian/Conservator: None - per MD, pt capable of making decisions on her own behalf    Abuse/Neglect Physical Abuse: Denies Verbal Abuse: Denies Sexual Abuse: Denies Exploitation of patient/patient's resources: Denies Self-Neglect: Denies   Emotional Status Pt's affect, behavior adn adjustment status: Pt very pleasant, talkative and completes interview without difficulty.  When questioned about mood and this lengthy hospitalization and new to HD, she reports that she feels she is "doing pretty well."  States, "I just keep realizing that I'm lucky just to be here.  I can deal with the dialysis part."  She does become a little tearful as husband talks about fear of "losing her."  Referred  for neuropsychology who was able to meet with her today. Recent Psychosocial Issues: None Pyschiatric History: none Substance Abuse History: None   Patient / Family Perceptions, Expectations & Goals Pt/Family understanding of illness & functional limitations: Pt and spouse have a very good understanding of her complicated medical course, current issues and functional limitations/ need for CIR. Premorbid pt/family roles/activities: Pt was completely independent and active at home and in community. Anticipated changes in roles/activities/participation: Pt's spouse assuming primary caregiver support role as she will need  physical asisstance. Pt/family expectations/goals: "I just want to get as much strength back as I can."   [employer]: None Premorbid Home Care/DME Agencies: None Transportation available at discharge: yes Resource referrals recommended: Neuropsychology   Discharge Planning Living Arrangements: Spouse/significant other Support Systems: Spouse/significant other Type of Residence: Private residence Insurance underwriter Resources: [employer] ([place] Medicare) Financial Resources: [place] Referred: No Living Expenses: Own Money Management: Spouse Does the patient have any problems obtaining your medications?: No Home Management: pt and spouse Patient/Family Preliminary Plans: Pt to d/c home with spouse providing all needed DME Social Work Anticipated Follow Up Needs: HH/OP Expected length of stay: 19-21 days    Clinical Impression Very pleasant woman here after a lengthy hospitalization and with debility and critical illness myelopathy.  New HD pt now as well.  She and husband very eager for CIR and hopeful she will make good gains but realistic she will require 24/7 assistance at home.  Husband very supportive and engaged and fully prepared to provide this.  Pt reports her mood has been stable and she has "come to terms" with need for HD.  Will follow for support and d/c planning needs.   Lennart Pall 05/09/2017, 5:07 PM       Lowella Curb, LCSW Social Worker Signed   Progress Notes Date of Service: 05/09/2017  5:07 PM  Related encounter: Admission (Discharged) from 05/07/2017 in [facility] copied text [place] for attribution information Social Work  Social Work Assessment and Plan   Patient Details  Name: [name] MRN: [id number] Date of Birth: 08/28/1951   Today's Date: 05/09/2017   Problem List:      Patient Active Problem List    Diagnosis Date Noted  . Skin ulcer of right heel ([place])    .  PAF (paroxysmal atrial fibrillation) ([place])    . Hypotension    . Leukocytosis    . Anemia of chronic disease    . Anxiety state    . Depression    . Prediabetes    . Stage 5 chronic kidney disease not on chronic dialysis ([place])    . 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 Pressure injury of skin 04/19/2017  . Acute renal failure (ARF) ([place])    . Cellulitis of lower extremity    . Edema extremities    . Acute respiratory failure ([place])    . Acute renal failure ([place])    . Multi-organ failure with heart failure ([place])    . Palliative care by specialist    . Goals of care, counseling/discussion    . Septic shock ([place]) 04/08/2017    Past Medical History:      Past Medical History:  Diagnosis Date  . A-fib ([place])      on pradaxa  . Chronic kidney disease    . Dysrhythmia    . Hypertension    . Hypothyroidism    .  Thyroid disease      Past Surgical History:       Past Surgical History:  Procedure Laterality Date  . DIALYSIS/PERMA CATHETER INSERTION N/A 04/26/2017    Procedure: Dialysis/Perma Catheter Insertion;  Surgeon: Algernon Huxley, MD;  Location: [facility];  Service: Cardiovascular;  Laterality: N/A;  . WRIST ARTHROSCOPY        Social History:  reports that she has never smoked. She has never used smokeless tobacco. She reports that she does not drink alcohol or use drugs.   Family / Support Systems Marital Status: Married How Long?: 22 yrs Patient Roles: Spouse, Parent Spouse/Significant Other: [name] @ (C) [phone] Anticipated Caregiver: Spouse  Ability/Limitations of Caregiver: he works part time but will arrange care/provide 24/7 Caregiver Availability: 24/7 Family Dynamics: [employer] very encouraging and supportive of pt.  States, "This whole thing shook my world.  I'm not going to let anything happen to her."   Social History Preferred language: English Religion: Baptist Cultural Background: NA Education: college Read: Yes Write: Yes Employment Status:  Retired Freight forwarder Issues: None Guardian/Conservator: None - per MD, pt capable of making decisions on her own behalf    Abuse/Neglect Physical Abuse: Denies Verbal Abuse: Denies Sexual Abuse: Denies Exploitation of patient/patient's resources: Denies Self-Neglect: Denies   Emotional Status Pt's affect, behavior adn adjustment status: Pt very pleasant, talkative and completes interview without difficulty.  When questioned about mood and this lengthy hospitalization and new to HD, she reports that she feels she is "doing pretty well."  States, "I just keep realizing that I'm lucky just to be here.  I can deal with the dialysis part."  She does become a little tearful as husband talks about fear of "losing her."  Referred for neuropsychology who was able to meet with her today. Recent Psychosocial Issues: None Pyschiatric History: none Substance Abuse History: None   Patient / Family Perceptions, Expectations & Goals Pt/Family understanding of illness & functional limitations: Pt and spouse have a very good understanding of her complicated medical course, current issues and functional limitations/ need for CIR. Premorbid pt/family roles/activities: Pt was completely independent and active at home and in community. Anticipated changes in roles/activities/participation: Pt's spouse assuming primary caregiver support role as she will need physical asisstance. Pt/family expectations/goals: "I just want to get as much strength back as I can."   [employer]: None Premorbid Home Care/DME Agencies: None Transportation available at discharge: yes Resource referrals recommended: Neuropsychology   Discharge Planning Living Arrangements: Spouse/significant other Support Systems: Spouse/significant other Type of Residence: Private residence Insurance underwriter Resources: [employer] ([place] Medicare) Financial Resources: [place] Referred:  No Living Expenses: Own Money Management: Spouse Does the patient have any problems obtaining your medications?: No Home Management: pt and spouse Patient/Family Preliminary Plans: Pt to d/c home with spouse providing all needed DME Social Work Anticipated Follow Up Needs: HH/OP Expected length of stay: 19-21 days    Clinical Impression Very pleasant woman here after a lengthy hospitalization and with debility and critical illness myelopathy.  New HD pt now as well.  She and husband very eager for CIR and hopeful she will make good gains but realistic she will require 24/7 assistance at home.  Husband very supportive and engaged and fully prepared to provide this.  Pt reports her mood has been stable and she has "come to terms" with need for HD.  Will follow for support and d/c planning needs.   HOYLE, LUCY 05/09/2017, 5:07  PM        Patient ID: [name], female   DOB: 09-14-1951, 66 y.o.   MRN: [id number]

## 2017-06-08 ENCOUNTER — Inpatient Hospital Stay (HOSPITAL_COMMUNITY): Payer: Medicare Other | Admitting: Occupational Therapy

## 2017-06-08 ENCOUNTER — Inpatient Hospital Stay (HOSPITAL_COMMUNITY): Payer: Medicare Other | Admitting: Physical Therapy

## 2017-06-08 DIAGNOSIS — D72819 Decreased white blood cell count, unspecified: Secondary | ICD-10-CM | POA: Insufficient documentation

## 2017-06-08 LAB — PROTIME-INR
INR: 2.55
Prothrombin Time: 27.2 s — ABNORMAL HIGH (ref 11.4–15.2)

## 2017-06-08 MED ORDER — PRO-STAT SUGAR FREE PO LIQD
30.0000 mL | Freq: Every day | ORAL | Status: DC
  Administered 2017-06-12: 30 mL via ORAL
  Filled 2017-06-08 (×3): qty 30

## 2017-06-08 NOTE — Progress Notes (Signed)
Physical Therapy Note  Patient Details  Name: [name] MRN: [id number] Date of Birth: 1951-10-02 Today's Date: 06/08/2017    Time: [phone] 55 minutes  1:1 No c/o pain.  W/c mobility with mod cuing, supervision assist x 35'.  Scooting transfers blocked practice to 23'', 22'', 21'' heights.  Pt initially mod A, declines to total A for transfers when fatigued. Pt requires mod cues for sequencing and motor planning.  Sit to stand training blocked practice at 25'', 24'', 23'' x 2 all with max A.  Supine NMR with bridging, LTR, SLR all with AAROM. Discussed home measurement sheet with pt and PT recommended hospital bed and scoot pivot transfers to favorite chair. Discussed car transfer with pt/husband and PT recommended attempting to borrow a standard car or husband to come for education on real car transfer.  [name] 06/08/2017, 10:43 AM

## 2017-06-08 NOTE — Progress Notes (Signed)
ANTICOAGULATION CONSULT NOTE - Follow Up Consult  Pharmacy Consult for Coumadin > Eliquis Indication: atrial fibrillation  No Known Allergies  Patient Measurements: Weight: 134 lb 7.7 oz (61 kg)  Vital Signs: Temp: 91 F (32.8 C) (08/31 0900) Temp Source: Oral (08/31 0550) BP: 105/70 (08/31 0550) Pulse Rate: 101 (08/31 0550)  Labs:  Recent Labs  06/06/17 0311 06/07/17 1014 06/07/17 1435 06/08/17 0503  HGB  --  10.0*  --   --   HCT  --  32.5*  --   --   PLT  --  395  --   --   LABPROT 29.9*  --  29.8* 27.2*  INR 2.88  --  2.86 2.55  CREATININE  --  3.76*  --   --     Estimated Creatinine Clearance: 13.4 mL/min (A) (by C-G formula based on SCr of 3.76 mg/dL (H)).  Assessment: 66 yo female with Afib on apixaban PTA, now on coumadin.   Asked by rehab team on 8/30 to switch back to apixaban today.  The pharmacist discussed w/ PA that Coumadin is drug of choice for ESRD, but patient and team wish to proceed with Eliquis for convenience.   INR is 2.55, still therapeutic from previous coumadin doses.  We will start apixaban when INR is <2.0.  Anemia of chronic disease. No bleeding reported.   Plan:  Coumadin discontinued 06/07/17. Will start Eliquis 5 mg BID once INR < 2. (Dose should be 5 mg for ESRD unless weight < 60 kg or age > 47 yrs).  Nicole Cella, RPh Clinical Pharmacist Pager: [phone]  06/08/2017 2:28 PM

## 2017-06-08 NOTE — Progress Notes (Signed)
[place] PHYSICAL MEDICINE & REHABILITATION     PROGRESS NOTE  Subjective/Complaints:  Pt seen laying in bed this AM.  She slept well overnight.  She had a good first day back in therapies.  She notes her wounds are healing.   ROS: Denies CP, SOB, N/V/D.  Objective: Vital Signs: Blood pressure 105/70, pulse (!) 101, temperature (!) 91 F (32.8 C), resp. rate 14, weight 61 kg (134 lb 7.7 oz), SpO2 100 %. No results found.  Recent Labs  06/07/17 1014  WBC 2.7*  HGB 10.0*  HCT 32.5*  PLT 395    Recent Labs  06/07/17 1014  NA 136  K 3.4*  CL 99*  GLUCOSE 81  BUN 40*  CREATININE 3.76*  CALCIUM 8.8*   CBG (last 3)  No results for input(s): GLUCAP in the last 72 hours.  Wt Readings from Last 3 Encounters:  06/07/17 61 kg (134 lb 7.7 oz)  06/05/17 72.5 kg (159 lb 13.3 oz)  06/02/17 64.5 kg (142 lb 3.2 oz)    Physical Exam:  BP 105/70 (BP Location: Right Arm)   Pulse (!) 101   Temp (!) 91 F (32.8 C)   Resp 14   Wt 61 kg (134 lb 7.7 oz)   SpO2 100%   BMI 22.38 kg/m  Constitutional: She appears well-developed and well-nourished. No distress.  HENT: Normocephalic and atraumatic.  Eyes: EOMI. No discharge.  Cardiovascular: Irregularly irregular rhythm present. Respiratory: Effort normal and breath sounds normal.  GI: Bowel sounds are normal. She exhibits no distension. Musculoskeletal: She exhibits no edema or tenderness.  Neurological: She is alert and oriented.  Motor: B/l UE: 4+/5 proximal to distal B/l LE: HF 3-/5, KE 3/5, ADF/PF 3+/5 (stable) Skin: Skin is warm and dry. She is not diaphoretic. Right foot with healed abrasions. Left shin/foot +VAC.   Psychiatric: She has a normal mood and affect. Her behavior is normal. Judgment and thought content normal.    Assessment/Plan: 1. 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 Physiatrist is providing close team  supervision and 24 hour management of active medical problems listed below. Physiatrist and rehab team continue to assess barriers to discharge/monitor patient progress toward functional and medical goals.  Function:  Bathing Bathing position   Position: Bed  Bathing parts Body parts bathed by patient: Right arm, Left arm, Chest, Abdomen, Right upper leg, Left upper leg, Front perineal area, Right lower leg Body parts bathed by helper: Buttocks, Back  Bathing assist Assist Level: Touching or steadying assistance(Pt > 75%)      Upper Body Dressing/Undressing Upper body dressing   What is the patient wearing?: Pull over shirt/dress     Pull over shirt/dress - Perfomed by patient: Thread/unthread left sleeve, Put head through opening Pull over shirt/dress - Perfomed by helper: Thread/unthread right sleeve, Pull shirt over trunk        Upper body assist Assist Level:  (Mod assist)      Lower Body Dressing/Undressing Lower body dressing   What is the patient wearing?: Pants, Non-skid slipper socks     Pants- Performed by patient: Pull pants up/down Pants- Performed by helper: Thread/unthread right pants leg, Thread/unthread left pants leg   Non-skid slipper socks- Performed by helper: Don/doff right sock, Don/doff left sock                  Lower body assist Assist for lower body dressing:  (  Total assist)      Toileting Toileting     Toileting steps completed by helper: Adjust clothing prior to toileting, Performs perineal hygiene, Adjust clothing after toileting    Toileting assist     Transfers Chair/bed transfer Chair/bed transfer activity did not occur: Safety/medical concerns (incontinent of bowel, time contraints) Chair/bed transfer method: Lateral scoot Chair/bed transfer assist level: Moderate assist (Pt 50 - 74%/lift or lower) Chair/bed transfer assistive device: Sliding board     Locomotion Ambulation Ambulation activity did not occur: Safety/medical  concerns (incontinent of bowel, time constraints)   Max distance: 5 Assist level: 2 helpers   Oceanographer activity did not occur: Safety/medical concerns (incontinent of bowel; time constraints)        Cognition Comprehension Comprehension assist level: Understands complex 90% of the time/cues 10% of the time  Expression Expression assist level: Expresses complex ideas: With extra time/assistive device  Social Interaction Social Interaction assist level: Interacts appropriately with others with medication or extra time (anti-anxiety, antidepressant).  Problem Solving Problem solving assist level: Solves basic 90% of the time/requires cueing < 10% of the time  Memory Memory assist level: Recognizes or recalls 75 - 89% of the time/requires cueing 10 - 24% of the time    Medical Problem List and Plan: 1.  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  Cont CIR 2.  DVT Prophylaxis/Anticoagulation: Pharmaceutical: Coumadin--question transition back to Eliquis  INR therapeutic on 8/31 3. Pain Management: tylenol prn  4. Mood: Remains motivated. LCSW to follow for support.  5. Neuropsych: This patient is not fully capable of making decisions on her own behalf. 6. Skin/Wound Care: Maintain adequate nutritional status to help promote healing. VAC dressing changes per plastics.  7. Fluids/Electrolytes/Nutrition: Monitor I/Os. Continue megace to help with appetite. On cardiac diet to help with food choices--add 1200 cc/FR 8. A fib with RVR: To continue amiodarone at 400 mg bid and metoprolol 50 mg bid.  9. CKD, now dialysis dependent: AVG postponed for now. HD dependent--Tue/Thu/Sat schedule at end of day to help with tolerance of therapy. 10. Hypotension: On amiodarone 10 mg tid.  Relatively controlled 8/31 11. Anemia of chronic disease: IV iron for iron deficiency. On aranesp weekly.  Labs with HD  Hb 10.0 on 8/30 (?accuracy) 12. Left foot wound: Wound  VAC over Acell for healing--dressing changes weekly by plastics.  13. Anxiety/depression: Team to provide ego support.  Continue Seroquel and Zoloft.  14. Constipation: Continue miralax and linzess. Increase senna S to bid.  15. Leukopenia  WBCs 2.7 on  8/30, likely erroneous  Cont to monitor  LOS (Days) 2 A FACE TO FACE EVALUATION WAS PERFORMED  [name] Lorie Phenix 06/08/2017 10:18 AM

## 2017-06-08 NOTE — Plan of Care (Signed)
Problem: RH Furniture Transfers Goal: LTG Patient will perform furniture transfers w/assist (OT/PT LTG: Patient will perform furniture transfers  with assistance (OT/PT).  Outcome: Not Applicable Date Met: 57/47/34 N/a 8/31  Problem: RH Ambulation Goal: LTG Patient will ambulate in home environment (PT) LTG: Patient will ambulate in home environment, # of feet with assistance (PT).  Outcome: Not Applicable Date Met: 03/70/96 N/a 8/31  Problem: RH Wheelchair Mobility Goal: LTG Patient will propel w/c in community environment (PT) LTG: Patient will propel wheelchair in community environment, # of feet with assist (PT)  Outcome: Not Applicable Date Met: 43/83/81 N/a 8/31  Problem: RH Stairs Goal: LTG Patient will ambulate up and down stairs w/assist (PT) LTG: Patient will ambulate up and down # of stairs with assistance (PT)  Outcome: Not Applicable Date Met: 84/03/75 N/a pt has ramp

## 2017-06-08 NOTE — Plan of Care (Signed)
Problem: Food- and Nutrition-Related Knowledge Deficit (NB-1.1) Goal: Nutrition education Formal process to instruct or train a patient/client in a skill or to impart knowledge to help patients/clients voluntarily manage or modify food choices and eating behavior to maintain or improve health. Outcome: Adequate for Discharge Nutrition Education Note  RD consulted for Renal Education. Provided "Food Pyramid for Healthy Eating for Patients with Kidney Disease" to patient/family. Reviewed food groups and provided written recommended serving sizes specifically determined for patient's current nutritional status.   Explained why diet restrictions are needed and provided lists of foods to limit/avoid that are high potassium, sodium, and phosphorus. Provided specific recommendations on safer alternatives of these foods. Strongly encouraged compliance of this diet.   Discussed importance of protein intake at each meal and snack. Provided examples of how to maximize protein intake throughout the day. Discussed need for fluid restriction with dialysis, importance of minimizing weight gain between HD treatments, and renal-friendly beverage options.  Encouraged pt to discuss specific diet questions/concerns with RD at HD outpatient facility. Teach back method used.  Expect fair compliance.  RD following for acute nutrition issues; see progress noted dated 06/08/17 for further details. RD will continue to follow and adjust care plan accordingly.  [name] A. Jimmye Norman, RD, LDN, CDE Pager: [phone] After hours Pager: [phone]

## 2017-06-08 NOTE — Consult Note (Signed)
[facility] Nurse wound consult note Reason for Consult:Re-consulted to assess sacral/buttock wound that was last assessed by my partner on 06/03/17.  Patient has had multiple episodes of liquid stool and maroon epithelium is sloughing off in small areas.  Otherwise wound is unchanged.  Staff have been implementing prophylactic sacral silicone foam and patient wears disposable briefs while working with therapy.  Will continue Gerhart's cream and discontinue silicone border foam dressing and disposable briefs ONLY while out of bed.  Contact with therapeutic linen will improve skin microclimate and promote healing.  Staff are reminded of this today at the bedside.  Eucerin cream is continued on the right foot.  Plastic surgery managing NPWT to left leg.   Wound type: Pressure and moisture (incontinence Associated Skin Damage) IAD Pressure Injury POA: No Measurement: 7 cm x 5 cm x 0.2 cm, sloughing with pale pink wound bed.  Wound PYK:DXIP pink Drainage (amount, consistency, odor) Sacral/buttocks wound with minimal serosanguinous  No odor.  Periwound:intact Dressing procedure/placement/frequency: We will continue Gerhart's Butt cream which is a compounded product containing antifungal, hydrocortisone and zinc oxide three times daily to the buttock and coccyx areas.  This is actually three times daily and as needed after fecal episodes which are currently liquid, but improving.  No disposable briefs while in bed.  No sacral foam dressing. Turn and reposition every two hours The right foot has several resolving areas of discoloration, we will treat twice daily with Eucerin cream and place foot into a pressure redistribution heel boot.   Bedside RN to call plastics team for further orders regarding NPWT dressing.  Will not follow at this time.  Please re-consult if needed.  Domenic Moras RN BSN [place] Pager [phone]

## 2017-06-08 NOTE — Progress Notes (Signed)
Occupational Therapy Session Note  Patient Details  Name: [name] MRN: [id number] Date of Birth: 1950-11-25  Today's Date: 06/08/2017 OT Individual Time: 1505-6979 and 1320-1430 OT Individual Time Calculation (min): 56 min and 70 min 25 make up mins   Short Term Goals: Week 1:  OT Short Term Goal 1 (Week 1): Pt will complete lateral leans with min assist and mod cues to decrease burden of care with toileting hygiene OT Short Term Goal 2 (Week 1): Pt will complete LB dressing at bed level with min assist OT Short Term Goal 3 (Week 1): Pt will complete slide board transfer to drop arm BSC with min assist  Skilled Therapeutic Interventions/Progress Updates:    1) Treatment session with focus on bed mobility and transfers to increase participation in self-care tasks.  Engaged in bed mobility with focus on rolling and adjusting legs in bed to participate in bathing and LB dressing.  Pt requires max assist to bend knees to engage in rolling for hygiene and when pulling pants over hips.  Mod cues for sequencing during rolling and to attempt to pull pants over hips.  Cues provided for sequencing with bed mobility with pt requiring mod (lifting) assist to come to EOB.  Completed slide board transfer to w/c with mod assist with cues for sequencing.  UB bathing and dressing completed at sink with setup for items.  Pt with decreased problem solving when shirt got twisted, requiring assist to problem solve and correct.  Pt with increased grip to open and squeeze toothpaste tube.  Left upright in w/c with all needs in reach and breakfast tray setup.  2) Treatment session with focus on trunk control with lateral scoots and sit > stand.  Pt received upright in w/c willing to engage in treatment session.  Completed slide board transfers w/c <> therapy mat with mod assist to unlevel surface and min assist downhill.  Alternated Wii bowling in sitting to challenge sitting balance and sit > stand x8 from therapy  mat with one UE on RW and other from mat to increase anterior weight shift and lift off from mat.  +2 for sit > stand, providing manual facilitation for weight shift and lift off.  Pt tolerated standing 30 -60 seconds at a time.  Returned to bed via slide board and left with pants doffed to allow air to buttocks.  Therapy Documentation Precautions:  Precautions Precautions: Fall Precaution Comments: R IJ perm cath; wound vac LLE; LUE DVT Restrictions Weight Bearing Restrictions: No General:   Vital Signs: Therapy Vitals Temp: 98.6 F (37 C) Temp Source: Oral Pulse Rate: (!) 101 Resp: 14 BP: 105/70 Patient Position (if appropriate): Lying Oxygen Therapy SpO2: 100 % O2 Device: Not Delivered Pain:  Pt with no c/o pain  See Function Navigator for Current Functional Status.   Therapy/Group: Individual Therapy  [name] 06/08/2017, 8:59 AM

## 2017-06-08 NOTE — Progress Notes (Signed)
Physical Therapy Session Note  Patient Details  Name: [name] MRN: [id number] Date of Birth: 08/17/1951  Today's Date: 06/08/2017 PT Individual Time: 1129-1156 PT Individual Time Calculation (min): 27 min   Short Term Goals: Week 1:  PT Short Term Goal 1 (Week 1): pt will perform bed>< w/c transfers with mod assist PT Short Term Goal 2 (Week 1): pt will propel w/c x 150' with supervision PT Short Term Goal 3 (Week 1): pt will sit> stand with functional hip and back extension, with assistance of 1 person PT Short Term Goal 4 (Week 1): pt will tolerate standing in standing frame or tilt table x 10 minutes  Skilled Therapeutic Interventions/Progress Updates:    no c/o pain but reports fatigue.  Session focus on transfer training.  Pt completes 2 slide board transfers (L and R) with max fade to mod multimodal cues and overall min assist.  Pt requires increased time and cues for hand placement, foot placement, weight shifting, head/hips relationship as well as assist to place/remove board.  Pt returned to room at end of session and positioned upright in w/c with call bell in reach and needs met.   Therapy Documentation Precautions:  Precautions Precautions: Fall Precaution Comments: R IJ perm cath; wound vac LLE; LUE DVT Restrictions Weight Bearing Restrictions: No   See Function Navigator for Current Functional Status.   Therapy/Group: Individual Therapy  Michel Santee 06/08/2017, 11:57 AM

## 2017-06-08 NOTE — Progress Notes (Signed)
Initial Nutrition Assessment  DOCUMENTATION CODES:   Not applicable  INTERVENTION:   -Decrease 30 ml Prostat to daily, each supplement provides 100 kcals and 15 grams protein -Continue renal MVI -Provided reinforcement on renal diet- see education not for further details  NUTRITION DIAGNOSIS:   Increased nutrient needs related to wound healing as evidenced by estimated needs.  GOAL:   Patient will meet greater than or equal to 90% of their needs  MONITOR:   PO intake, Supplement acceptance, Weight trends, Labs, Skin, I & O's  REASON FOR ASSESSMENT:   Consult Diet education  ASSESSMENT:   Pt admitted to CIR with critical illness myopathy.  Spoke with pt, who reports appetite is improving; noted meal completion 50-100%. Pt reports that she was originally on a regular diet when appetite was poor, however, she was recently transitioned to a renal diet. She does not like the Prostat supplement, however, understands importance of taking it.   Pt shares UBW is around 160-170#. Noted wt fluctuations, likely related to fluid. Pt reports being very edematous PTA.   Pt with wound vac to lt leg incision. RD reinforced importance of good protein intake to promote healing. Discussed ways to increase protein intake and diet. Pt able to recall basic principles of renal diet. Reviewed renal diet with her and discussed how diet may be liberalized as an outpatient based upon lab values. She shares she will miss some of her favorite foods, such as tomatoes, but realized importance of K and Phos restriction.  Nutrition-Focused physical exam completed. Findings are no fat depletion, no muscle depletion, and no edema.   Medications reviewed and include megace.   Labs reviewed: K: 3.4, Phos: 5.2 (on binder).  Diet Order:  Diet renal with fluid restriction Fluid restriction: 1200 mL Fluid; Room service appropriate? Yes; Fluid consistency: Thin  Skin:  Wound (see comment) (wound vac on lt leg  incision, MASD on sacrum and buttocks)  Last BM:  06/07/17  Height:   Ht Readings from Last 1 Encounters:  06/03/17 5\' 5"  (1.651 m)    Weight:   Wt Readings from Last 1 Encounters:  06/07/17 134 lb 7.7 oz (61 kg)    Ideal Body Weight:  56.8 kg  BMI:  Body mass index is 22.38 kg/m.  Estimated Nutritional Needs:   Kcal:  1800-2000  Protein:  95-110 grams  Fluid:  1000 ml + UOP  EDUCATION NEEDS:   Education needs addressed  Orren Pietsch A. Jimmye Norman, RD, LDN, CDE Pager: [phone] After hours Pager: [phone]

## 2017-06-08 NOTE — Progress Notes (Signed)
Patient ID: [name], female   DOB: 07/16/1951, 66 y.o.   MRN: [id number]  [facility] Progress Note   Assessment/ Plan:   1. Chronic systolic congestive heart failure/dilated cardiomyopathy: Volume status continues to improve with ongoing hemodialysis/ultrafiltration and she continues to do better clinically. 2. ESRD: Continue dialysis on a Tuesday/Thursday/Saturday schedule with next dialysis tomorrow. Permanent access placement deferred to out-patient when clinically more stable. Awaiting confirmation of OP HD unit placement. 3. Anemia:Hgb improving. Iron deficiency previously identified on labs-continue intravenous iron/ESA  4. CKD-MBD: PTH level at goal for ESRD and with rising calcium level-monitor phosphorous trend with current therapy. 5. Nutrition: With protein calorie malnutrition/chronic illness-continue nutritional supplementation with protein supplement/MVI. 6. Atrial fibrillation with RVR: Improving rate control with amiodarone and metoprolol- plans in place for possible TEE guided cardioversion if rate control remains difficult on beta-blocker (dose limited by blood pressure). 7. Hypotension: On midodrine, monitor with ultrafiltration and hemodialysis  Subjective:   Intradialytic blood pressure drop noted yesterday. Denies any chest pain or shortness of breath.    Objective:   BP 105/70 (BP Location: Right Arm)   Pulse (!) 101   Temp (!) 91 F (32.8 C)   Resp 14   Wt 61 kg (134 lb 7.7 oz)   SpO2 100%   BMI 22.38 kg/m   Physical Exam: Gen: Seen while participating in therapy CVS: Pulse irregularly irregular with mild tachycardia, S1 and S2 with ejection systolic murmur Resp: Diminished breath sounds over bases, anteriorly clear to auscultation Abd: Soft, flat, nontender, bowel sounds normal Ext: Trace-1+ lower extremity edema with Wound VAC left leg  Labs: BMET  Recent Labs Lab 06/02/17 1729 06/03/17 0509 06/04/17 1431 06/05/17 0845  06/07/17 1014  NA 134* 136 136 136 136  K 3.4* 3.7 4.3 4.7 3.4*  CL 98* 101 101 100* 99*  CO2 24 25 21* 21* 21*  GLUCOSE 87 114* 131* 100* 81  BUN 34* 8 27* 35* 40*  CREATININE 3.31* 1.37* 2.98* 3.56* 3.76*  CALCIUM 8.5* 8.1* 8.8* 8.7* 8.8*  PHOS 3.8  --  4.5 5.9* 5.2*   CBC  Recent Labs Lab 06/02/17 1730 06/05/17 0845 06/07/17 1014  WBC 9.9 7.8 2.7*  HGB 8.9* 9.0* 10.0*  HCT 28.3* 29.4* 32.5*  MCV 105.6* 108.5* 108.3*  PLT 271 333 395   Medications:    . amiodarone  400 mg Oral BID  . darbepoetin (ARANESP) injection - DIALYSIS  60 mcg Intravenous Q Thu-HD  . famotidine  20 mg Oral QHS  . feeding supplement (PRO-STAT SUGAR FREE 64)  30 mL Oral BID  . Gerhardt's butt cream   Topical TID  . hydrocerin   Topical BID  . levothyroxine  100 mcg Oral QAC breakfast  . megestrol  40 mg Oral BID  . metoprolol tartrate  50 mg Oral BID  . midodrine  10 mg Oral TID WC  . multivitamin  1 tablet Oral QHS  . polyethylene glycol  17 g Oral BID  . QUEtiapine  25 mg Oral QHS  . senna-docusate  2 tablet Oral BID  . sertraline  25 mg Oral QHS  . sucroferric oxyhydroxide  500 mg Oral TID WC   Elmarie Shiley, MD 06/08/2017, 10:15 AM

## 2017-06-09 ENCOUNTER — Inpatient Hospital Stay (HOSPITAL_COMMUNITY): Payer: Medicare Other

## 2017-06-09 LAB — PROTIME-INR
INR: 2.17
Prothrombin Time: 24 seconds — ABNORMAL HIGH (ref 11.4–15.2)

## 2017-06-09 MED ORDER — HEPARIN SODIUM (PORCINE) 1000 UNIT/ML DIALYSIS
40.0000 [IU]/kg | INTRAMUSCULAR | Status: DC | PRN
  Administered 2017-06-10: 2400 [IU] via INTRAVENOUS_CENTRAL
  Filled 2017-06-09 (×2): qty 3

## 2017-06-09 NOTE — Progress Notes (Signed)
Patient was still on the unit, Dialysis unit called the the nurse said patient wasn't on their list but she would try to get her in an hour, the nurse also told RN to hold all meds until after dialysis. We continue to monir.

## 2017-06-09 NOTE — Progress Notes (Signed)
Occupational Therapy Session Note  Patient Details  Name: [name] MRN: [id number] Date of Birth: 03-23-1951  Today's Date: 06/09/2017 OT Individual Time: 1000-1100 OT Individual Time Calculation (min): 60 min    Short Term Goals: Week 1:  OT Short Term Goal 1 (Week 1): Pt will complete lateral leans with min assist and mod cues to decrease burden of care with toileting hygiene OT Short Term Goal 2 (Week 1): Pt will complete LB dressing at bed level with min assist OT Short Term Goal 3 (Week 1): Pt will complete slide board transfer to drop arm BSC with min assist  Skilled Therapeutic Interventions/Progress Updates:    1;1. Pt declines bathing this session. Pt dons pants in long sitting figure four after OT threads wound vac through pant leg with VC for technique and min A for LE management. Pt rolls with min A to reach back to advance pants past hips. Pt dons non slip socks in long siting figure four. Pt slide board trnasfer EOB<>w/c<>EOM with min A for board place ment. Pt sits EOM leaning laterally B directions in max ranges outside BOS to obtain towels/washcloths to fold and place in pile on L with VC for leaning technique in prep for clothing management on BSC and strengthening BUE required for ADLs. Exited session with pt semi reclined in w/c with call light in reach and all needs met.  Therapy Documentation Precautions:  Precautions Precautions: Fall Precaution Comments: R IJ perm cath; wound vac LLE; LUE DVT Restrictions Weight Bearing Restrictions: No Pain: Pain Assessment Pain Assessment: No/denies pain  See Function Navigator for Current Functional Status.   Therapy/Group: Individual Therapy  Tonny Branch 06/09/2017, 10:47 AM

## 2017-06-09 NOTE — Progress Notes (Signed)
ANTICOAGULATION CONSULT NOTE - Follow Up Consult  Pharmacy Consult for Coumadin > Eliquis Indication: atrial fibrillation  No Known Allergies  Patient Measurements: Weight: 134 lb 7.7 oz (61 kg)  Vital Signs: Temp: 98.5 F (36.9 C) (09/01 0545) Temp Source: Oral (09/01 0545) BP: 101/69 (09/01 0545) Pulse Rate: 97 (09/01 0545)  Labs:  Recent Labs  06/07/17 1014 06/07/17 1435 06/08/17 0503 06/09/17 0603  HGB 10.0*  --   --   --   HCT 32.5*  --   --   --   PLT 395  --   --   --   LABPROT  --  29.8* 27.2* 24.0*  INR  --  2.86 2.55 2.17  CREATININE 3.76*  --   --   --     Estimated Creatinine Clearance: 13.4 mL/min (A) (by C-G formula based on SCr of 3.76 mg/dL (H)).  Assessment: 66 yo female with Afib on coumadin that is being transitioned to apixaban. INR is 2.17 today, so will need to hold the apixaban at least one more day prior to starting. Likely to begin tomorrow. CBC is stable, no bleeding noted.  Plan:  Coumadin discontinued 06/07/17. Will start Eliquis 5 mg BID once INR < 2. Monitor for s/sx of bleeding   Patterson Hammersmith PharmD PGY1 Pharmacy Practice Resident 06/09/2017 2:10 PM Pager: [phone]

## 2017-06-09 NOTE — Progress Notes (Signed)
[facility]     PROGRESS NOTE  Subjective/Complaints:  Patient seen lying bed this morning. She is slept well overnight. She denies complaints.  ROS: Denies CP, SOB, N/V/D.  Objective: Vital Signs: Blood pressure 101/69, pulse 97, temperature 98.5 F (36.9 C), temperature source Oral, resp. rate 18, weight 61 kg (134 lb 7.7 oz), SpO2 97 %. No results found.  Recent Labs  06/07/17 1014  WBC 2.7*  HGB 10.0*  HCT 32.5*  PLT 395    Recent Labs  06/07/17 1014  NA 136  K 3.4*  CL 99*  GLUCOSE 81  BUN 40*  CREATININE 3.76*  CALCIUM 8.8*   CBG (last 3)  No results for input(s): GLUCAP in the last 72 hours.  Wt Readings from Last 3 Encounters:  06/07/17 61 kg (134 lb 7.7 oz)  06/05/17 72.5 kg (159 lb 13.3 oz)  06/02/17 64.5 kg (142 lb 3.2 oz)    Physical Exam:  BP 101/69 (BP Location: Right Arm)   Pulse 97   Temp 98.5 F (36.9 C) (Oral)   Resp 18   Wt 61 kg (134 lb 7.7 oz)   SpO2 97%   BMI 22.38 kg/m  Constitutional: She appears well-developed and well-nourished. No distress.  HENT: Normocephalic and atraumatic.  Eyes: EOMI. No discharge.  Cardiovascular: Irregularly irregular rhythm present.No JVD Respiratory: Effort normal and breath sounds normal.  GI: Bowel sounds are normal. She exhibits no distension. Musculoskeletal: She exhibits no edema or tenderness.  Neurological: She is alert and oriented.  Motor: B/l UE: 4+/5 proximal to distal B/l LE: HF 3-/5, KE 3/5, ADF/PF 3+/5 (unchanged) Skin: Skin is warm and dry. She is not diaphoretic. Right foot with healed abrasions. Left shin/foot +VAC.   Psychiatric: She has a normal mood and affect. Her behavior is normal. Judgment and thought content normal.    Assessment/Plan: 1. 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 Physiatrist is providing close team supervision and 24 hour  management of active medical problems listed below. Physiatrist and rehab team continue to assess barriers to discharge/monitor patient progress toward functional and medical goals.  Function:  Bathing Bathing position   Position: Bed  Bathing parts Body parts bathed by patient: Right arm, Left arm, Chest, Abdomen, Right upper leg, Left upper leg, Front perineal area, Right lower leg Body parts bathed by helper: Buttocks, Back  Bathing assist Assist Level: Touching or steadying assistance(Pt > 75%)      Upper Body Dressing/Undressing Upper body dressing   What is the patient wearing?: Pull over shirt/dress     Pull over shirt/dress - Perfomed by patient: Thread/unthread left sleeve, Put head through opening Pull over shirt/dress - Perfomed by helper: Thread/unthread right sleeve, Pull shirt over trunk        Upper body assist Assist Level:  (Mod assist)      Lower Body Dressing/Undressing Lower body dressing   What is the patient wearing?: Pants, Non-skid slipper socks     Pants- Performed by patient: Pull pants up/down Pants- Performed by helper: Thread/unthread right pants leg, Thread/unthread left pants leg   Non-skid slipper socks- Performed by helper: Don/doff right sock, Don/doff left sock                  Lower body assist Assist for lower body dressing:  (Total assist)      Toileting Toileting     Toileting steps  completed by helper: Adjust clothing prior to toileting, Performs perineal hygiene, Adjust clothing after toileting    Toileting assist     Transfers Chair/bed transfer Chair/bed transfer activity did not occur: Safety/medical concerns (incontinent of bowel, time contraints) Chair/bed transfer method: Lateral scoot Chair/bed transfer assist level: Touching or steadying assistance (Pt > 75%) Chair/bed transfer assistive device: Sliding board     Locomotion Ambulation Ambulation activity did not occur: Safety/medical concerns (incontinent of  bowel, time constraints)   Max distance: 5 Assist level: 2 helpers   Oceanographer activity did not occur: Safety/medical concerns (incontinent of bowel; time constraints)        Cognition Comprehension Comprehension assist level: Follows complex conversation/direction with extra time/assistive device  Expression Expression assist level: Expresses complex ideas: With extra time/assistive device  Social Interaction Social Interaction assist level: Interacts appropriately with others with medication or extra time (anti-anxiety, antidepressant).  Problem Solving Problem solving assist level: Solves complex problems: With extra time  Memory Memory assist level: Recognizes or recalls 50 - 74% of the time/requires cueing 25 - 49% of the time    Medical Problem List and Plan: 1.  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  Cont CIR 2.  DVT Prophylaxis/Anticoagulation: Pharmaceutical: Eliquis restarted 3. Pain Management: tylenol prn  4. Mood: Remains motivated. LCSW to follow for support.  5. Neuropsych: This patient is not fully capable of making decisions on her own behalf. 6. Skin/Wound Care: Maintain adequate nutritional status to help promote healing. VAC dressing changes per plastics.  7. Fluids/Electrolytes/Nutrition: Monitor I/Os. Continue megace to help with appetite. On cardiac diet to help with food choices--add 1200 cc/FR 8. A fib with RVR: To continue amiodarone at 400 mg bid and metoprolol 50 mg bid.  9. CKD, now dialysis dependent: AVG postponed for now. HD dependent--Tue/Thu/Sat schedule at end of day to help with tolerance of therapy. 10. Hypotension: On amiodarone 10 mg tid.  Relatively controlled 9/1 11. Anemia of chronic disease: IV iron for iron deficiency. On aranesp weekly.  Labs with HD  Hb 10.0 on 8/30 (?accuracy)  Await repeat labs 12. Left foot wound: Wound VAC over Acell for healing--dressing changes weekly by plastics.   13. Anxiety/depression: Team to provide ego support.  Continue Seroquel and Zoloft.  14. Constipation: Continue miralax and linzess. Increase senna S to bid.  15. Leukopenia  WBCs 2.7 on  8/30, likely erroneous  Await repeat labs with HD  Cont to monitor  LOS (Days) 3 A FACE TO FACE EVALUATION WAS PERFORMED  Starlit Raburn Lorie Phenix 06/09/2017 8:54 AM

## 2017-06-10 ENCOUNTER — Inpatient Hospital Stay (HOSPITAL_COMMUNITY): Payer: Medicare Other | Admitting: Occupational Therapy

## 2017-06-10 ENCOUNTER — Inpatient Hospital Stay (HOSPITAL_COMMUNITY): Payer: Medicare Other

## 2017-06-10 LAB — RENAL FUNCTION PANEL
Albumin: 2.6 g/dL — ABNORMAL LOW (ref 3.5–5.0)
Anion gap: 12 (ref 5–15)
BUN: 32 mg/dL — ABNORMAL HIGH (ref 6–20)
CALCIUM: 8.3 mg/dL — AB (ref 8.9–10.3)
CO2: 26 mmol/L (ref 22–32)
CREATININE: 3.85 mg/dL — AB (ref 0.44–1.00)
Chloride: 97 mmol/L — ABNORMAL LOW (ref 101–111)
GFR, EST AFRICAN AMERICAN: 13 mL/min — AB (ref >60)
GFR, EST NON AFRICAN AMERICAN: 11 mL/min — AB (ref >60)
Glucose, Bld: 100 mg/dL — ABNORMAL HIGH (ref 65–99)
Phosphorus: 2.9 mg/dL (ref 2.5–4.6)
Potassium: 2.8 mmol/L — ABNORMAL LOW (ref 3.5–5.1)
SODIUM: 135 mmol/L (ref 135–145)

## 2017-06-10 LAB — CBC
HCT: 32.4 % — ABNORMAL LOW (ref 36.0–46.0)
Hemoglobin: 10 g/dL — ABNORMAL LOW (ref 12.0–15.0)
MCH: 33.4 pg (ref 26.0–34.0)
MCHC: 30.9 g/dL (ref 30.0–36.0)
MCV: 108.4 fL — ABNORMAL HIGH (ref 78.0–100.0)
PLATELETS: 383 10*3/uL (ref 150–400)
RBC: 2.99 MIL/uL — AB (ref 3.87–5.11)
RDW: 23.9 % — ABNORMAL HIGH (ref 11.5–15.5)
WBC: 2.5 10*3/uL — AB (ref 4.0–10.5)

## 2017-06-10 LAB — PROTIME-INR
INR: 2.04
Prothrombin Time: 22.9 seconds — ABNORMAL HIGH (ref 11.4–15.2)

## 2017-06-10 MED ORDER — APIXABAN 5 MG PO TABS
5.0000 mg | ORAL_TABLET | Freq: Two times a day (BID) | ORAL | Status: DC
  Administered 2017-06-11 – 2017-06-19 (×17): 5 mg via ORAL
  Filled 2017-06-10 (×17): qty 1

## 2017-06-10 MED ORDER — POTASSIUM CHLORIDE CRYS ER 20 MEQ PO TBCR
20.0000 meq | EXTENDED_RELEASE_TABLET | Freq: Two times a day (BID) | ORAL | Status: AC
  Administered 2017-06-10 – 2017-06-11 (×3): 20 meq via ORAL
  Filled 2017-06-10 (×3): qty 1

## 2017-06-10 NOTE — Progress Notes (Signed)
Occupational Therapy Session Note  Patient Details  Name: [name] MRN: [id number] Date of Birth: 10/17/1950  Today's Date: 06/10/2017 OT Individual Time: 0800-0905 OT Individual Time Calculation (min): 65 min    Skilled Therapeutic Interventions/Progress Updates:    Tx focus on adaptive bathing/dressing skills, dynamic balance, and endurance during self care tasks.   Pt greeted in bed, finishing up breakfast. Reported significant fatigue due to late dialysis, but agreeable to complete BADLs. Max A for supine<sit EOB, where she completed UB bathing. Pt unable to doff shirt by herself due to UE weakness/AROM deficits. Attempted lateral leans for pericare with pt sliding off of bed. Utilized circle sitting in bed for washing bilateral LEs with towel placed under each leg so she could pull them up herself. Pt able to don Rt sock with extra time. After rolling R>L for pericare (Min A), she returned to circle sitting/long sitting while OT threaded pants through LEs (and wound vac). Pt requiring Mod A for maintaining sitting balance unsupported. Min A for donning overhead shirt. At end of tx pt was repositioned for comfort on Rt side for pressure relief. Left with all needs within reach at time of departure.     Therapy Documentation Precautions:  Precautions Precautions: Fall Precaution Comments: R IJ perm cath; wound vac LLE; LUE DVT Restrictions Weight Bearing Restrictions: No Pain: No c/o pain during tx    ADL:  :    See Function Navigator for Current Functional Status.   Therapy/Group: Individual Therapy  [name] A Teofilo Lupinacci 06/10/2017, 12:17 PM

## 2017-06-10 NOTE — Progress Notes (Signed)
Physical Therapy Session Note  Patient Details  Name: [name] MRN: [id number] Date of Birth: 04/15/1951  Today's Date: 06/10/2017 PT Individual Time: 2585-2778, 2423-5361 PT Individual Time Calculation (min): 30 min, 58 min   Short Term Goals: Week 1:  PT Short Term Goal 1 (Week 1): pt will perform bed>< w/c transfers with mod assist PT Short Term Goal 2 (Week 1): pt will propel w/c x 150' with supervision PT Short Term Goal 3 (Week 1): pt will sit> stand with functional hip and back extension, with assistance of 1 person PT Short Term Goal 4 (Week 1): pt will tolerate standing in standing frame or tilt table x 10 minutes  Skilled Therapeutic Interventions/Progress Updates:    Session 1: PT supine in bed upon PT arrival, denies pain and agreeable to therapy tx. Pt transferred from supine to sitting EOB with mod assist to help move LEs and lift trunk. Pt performed x 2 sit<>stands from elevated bed surface with max assist and using RW, and x 2 unsuccessful attempts, increased time to complete. In standing, x 2 minutes before needing a seated rest break pt worked on alternating lifting heels off the ground. Pt performed slideboard transfer with mod assist from bed to w/c, verbal cues for technqiue and facilitation for anterior weightshift. Pt left seated in w/c at end of session with needs in reach.   Session 2: Pt supine in bed upon PT arrival, hesitant but agreeable to therapy tx. Pt denies pain but reports feeling very fatigued since getting back late from dialysis. Pt transferred from supine to sitting EOB with mod assist to help move LEs and lift trunk. Pt performed slideboard transfers bed<>w/c x 2 this session with mod assist, facilitation for anterior weightshift.Pt declined standing activities this session secondary to fatigue. Pt use UE ergometer alternating forwards and backwards every minute x 6 minutes with rest breaks throughout secondary to fatigue. Pt used kinetron x 10 minutes for  LE strengthening and endurance with max encouragement. Pt performed x 10 marches and 2 x 10 LAQ while seated in w/c. Pt propelled w/c x 50 ft using B UEs with min assist for momentum and steering. Pt transferred back to bed and left supine in bed at end of session with needs in reach.   Therapy Documentation Precautions:  Precautions Precautions: Fall Precaution Comments: R IJ perm cath; wound vac LLE; LUE DVT Restrictions Weight Bearing Restrictions: No   See Function Navigator for Current Functional Status.   Therapy/Group: Individual Therapy  Netta Corrigan, PT, DPT 06/10/2017, 7:47 AM

## 2017-06-10 NOTE — Progress Notes (Signed)
Occupational Therapy Session Note  Patient Details  Name: [name] MRN: [id number] Date of Birth: 10/08/1951  Today's Date: 06/10/2017 OT Individual Time: 8381-8403 OT Individual Time Calculation (min): 42 min   Skilled Therapeutic Interventions/Progress Updates:    1;1. Pt reporting having rough night not returning to room from dialysis till 3am. Pt reporting needing to use bathroom. Pt slide board transfer uphill to [facility] with MOD A for lifting downhill with MIN A to w/c and VC for head/hips relationship and hand placement throughout transfer. Pt able to void bowel and bladder with increased time. Pt requires total A for toileting but able to participate in wiping front peri area and leaning laterally/reaching back for partial clothing management with VC for leaning tecnique. Pt requires rest breaks 2/2 fatigue and OT discusses options of wearing sun dresses to lessen clothing management requirements. Pt slide board transfers back to bed with min A for board management and VC as stated above. Exited session with pt semi reclined in bed with call light in reach and husband entering room.  Therapy Documentation Precautions:  Precautions Precautions: Fall Precaution Comments: R IJ perm cath; wound vac LLE; LUE DVT Restrictions Weight Bearing Restrictions: No  See Function Navigator for Current Functional Status.   Therapy/Group: Individual Therapy  Tonny Branch 06/10/2017, 11:33 AM

## 2017-06-10 NOTE — Progress Notes (Signed)
[facility] PH[facility]ICAL MEDICINE & REHABILITATION     PROGRESS NOTE  Subjective/Complaints:  Patient seen lying in bed this morning. She states she slept well overnight after going to sleep. She states she returned from HD at 4:30 AM and is still sleepy.  ROS: Denies CP, SOB, N/V/D.  Objective: Vital Signs: Blood pressure 94/70, pulse 98, temperature 98.1 F (36.7 C), temperature source Oral, resp. rate 18, weight 64.5 kg (142 lb 3.2 oz), SpO2 100 %. No results found.  Recent Labs  06/07/17 1014 06/09/17 2249  WBC 2.7* 2.5*  HGB 10.0* 10.0*  HCT 32.5* 32.4*  PLT 395 383    Recent Labs  06/07/17 1014 06/09/17 2249  NA 136 135  K 3.4* 2.8*  CL 99* 97*  GLUCOSE 81 100*  BUN 40* 32*  CREATININE 3.76* 3.85*  CALCIUM 8.8* 8.3*   CBG (last 3)  No results for input(s): GLUCAP in the last 72 hours.  Wt Readings from Last 3 Encounters:  06/10/17 64.5 kg (142 lb 3.2 oz)  06/05/17 72.5 kg (159 lb 13.3 oz)  06/02/17 64.5 kg (142 lb 3.2 oz)    Physical Exam:  BP 94/70 (BP Location: Right Arm)   Pulse 98   Temp 98.1 F (36.7 C) (Oral)   Resp 18   Wt 64.5 kg (142 lb 3.2 oz)   SpO2 100%   BMI 23.66 kg/m  Constitutional: She appears well-developed and well-nourished. No distress.  HENT: Normocephalic and atraumatic.  Eyes: EOMI. No discharge.  Cardiovascular: IRIR.No JVD Respiratory:  Resp: Effort normal and breath sounds normal.  GI: Bowel sounds are normal. She exhibits no distension. Musculoskeletal: She exhibits no edema or tenderness.  Neurological: She is alert and oriented.  Motor: B/l UE: 4+/5 proximal to distal B/l LE: HF 3-/5, KE 3/5, ADF/PF 3+/5 (stable) Skin: Skin is warm and dry. She is not diaphoretic. Right foot with healed abrasions. Left shin/foot +VAC.   Psychiatric: She has a normal mood and affect. Her behavior is normal. Judgment and thought content normal.    Assessment/Plan: 1. 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 Physiatrist is providing close team supervision and 24 hour management of active medical problems listed below. Physiatrist and rehab team continue to assess barriers to discharge/monitor patient progress toward functional and medical goals.  Function:  Bathing Bathing position   Position: Bed  Bathing parts Body parts bathed by patient: Right arm, Left arm, Chest, Abdomen, Right upper leg, Left upper leg, Front perineal area, Right lower leg Body parts bathed by helper: Buttocks, Back  Bathing assist Assist Level: Touching or steadying assistance(Pt > 75%)      Upper Body Dressing/Undressing Upper body dressing   What is the patient wearing?: Pull over shirt/dress     Pull over shirt/dress - Perfomed by patient: Thread/unthread left sleeve, Put head through opening Pull over shirt/dress - Perfomed by helper: Thread/unthread right sleeve, Pull shirt over trunk        Upper body assist Assist Level:  (Mod assist)      Lower Body Dressing/Undressing Lower body dressing   What is the patient wearing?: Pants, Non-skid slipper socks     Pants- Performed by patient: Pull pants up/down Pants- Performed by helper: Thread/unthread right pants leg, Thread/unthread left pants leg   Non-skid slipper socks- Performed by helper: Don/doff right sock, Don/doff left sock  Lower body assist Assist for lower body dressing:  (Total assist)      Toileting Toileting     Toileting steps completed by helper: Adjust clothing prior to toileting, Performs perineal hygiene, Adjust clothing after toileting    Toileting assist     Transfers Chair/bed transfer Chair/bed transfer activity did not occur: Safety/medical concerns (incontinent of bowel, time contraints) Chair/bed transfer method: Lateral scoot Chair/bed transfer assist level: Touching or steadying assistance (Pt > 75%) Chair/bed transfer  assistive device: Sliding board     Locomotion Ambulation Ambulation activity did not occur: Safety/medical concerns (incontinent of bowel, time constraints)   Max distance: 5 Assist level: 2 helpers   Oceanographer activity did not occur: Safety/medical concerns (incontinent of bowel; time constraints)        Cognition Comprehension Comprehension assist level: Follows complex conversation/direction with no assist  Expression Expression assist level: Expresses complex ideas: With no assist  Social Interaction Social Interaction assist level: Interacts appropriately with others - No medications needed.  Problem Solving Problem solving assist level: Solves complex problems: Recognizes & self-corrects  Memory Memory assist level: Recognizes or recalls 90% of the time/requires cueing < 10% of the time    Medical Problem List and Plan: 1.  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  Cont CIR 2.  DVT Prophylaxis/Anticoagulation: Pharmaceutical: Cont Eliquis  3. Pain Management: tylenol prn  4. Mood: Remains motivated. LCSW to follow for support.  5. Neuropsych: This patient is not fully capable of making decisions on her own behalf. 6. Skin/Wound Care: Maintain adequate nutritional status to help promote healing. VAC dressing changes per plastics.  7. Fluids/Electrolytes/Nutrition: Monitor I/Os. Continue megace to help with appetite. On cardiac diet to help with food choices--add 1200 cc/FR 8. A fib with RVR: To continue amiodarone at 400 mg bid and metoprolol 50 mg bid.  9. CKD, now dialysis dependent: AVG postponed for now. HD dependent--Tue/Thu/Sat schedule at end of day to help with tolerance of therapy. 10. Hypotension: On amiodarone 10 mg tid.  Relatively controlled 9/2 11. Anemia of chronic disease: IV iron for iron deficiency. On aranesp weekly.  Labs with HD  Hb 10.0 on 9/1 12. Left foot wound: Wound VAC over Acell for healing--dressing  changes weekly by plastics.  13. Anxiety/depression: Team to provide ego support.  Continue Seroquel and Zoloft.  14. Constipation: Continue miralax and linzess. Increase senna S to bid.  15. Leukopenia  WBCs 2.5 on  9/1  Cont to monitor  Will order CBC with differential for tomorrow  LOS (Days) 4 A FACE TO FACE EVALUATION WAS PERFORMED  [name] Lorie Phenix 06/10/2017 7:24 AM

## 2017-06-10 NOTE — Progress Notes (Addendum)
Patient ID: [name], female   DOB: 01/15/1951, 66 y.o.   MRN: [id number]  [facility] Progress Note   Assessment/ Plan:   1. Chronic systolic congestive heart failure/dilated cardiomyopathy: She continues to get closer to her euvolemic status with ultrafiltration limited at dialysis by intradialytic tachycardia/hypotension. 2. ESRD: Continue dialysis on a Tuesday/Thursday/Saturday schedule with next dialysis tomorrow. Permanent access placement deferred to out-patient when clinically more stable. No acute indications for dialysis today-will order for dialysis again on Tuesday. 3. Anemia:Hgb improving. 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 5. Nutrition: With protein calorie malnutrition/chronic illness-continue nutritional supplementation with protein supplement/MVI. 6. Atrial fibrillation with RVR: Improving rate control with amiodarone and metoprolol- possible TEE guided cardioversion if rate control remains difficult on beta-blocker (dose limited by blood pressure). 7. Hypotension: On midodrine, monitor with ultrafiltration and hemodialysis 8. Hypokalemia: Secondary to renal diet/dialysis-will supplement via oral route  Subjective:   Denies any overnight complaints and states that inpatient rehabilitation is physically challenging.    Objective:   BP 94/70 (BP Location: Right Arm)   Pulse 98   Temp 98.1 F (36.7 C) (Oral)   Resp 18   Wt 64.5 kg (142 lb 3.2 oz)   SpO2 100%   BMI 23.66 kg/m   Physical Exam: Gen: Comfortably resting in recliner CVS: Pulse irregularly irregular with normal rate, S1 and S2 with ejection systolic murmur Resp: Diminished breath sounds over bases, anteriorly clear to auscultation Abd: Soft, flat, nontender, bowel sounds normal Ext: Trace\ lower extremity edema with Wound VAC left leg  Labs: BMET  Recent Labs Lab  06/04/17 1431 06/05/17 0845 06/07/17 1014 06/09/17 2249  NA 136 136 136 135  K 4.3 4.7 3.4* 2.8*  CL 101 100* 99* 97*  CO2 21* 21* 21* 26  GLUCOSE 131* 100* 81 100*  BUN 27* 35* 40* 32*  CREATININE 2.98* 3.56* 3.76* 3.85*  CALCIUM 8.8* 8.7* 8.8* 8.3*  PHOS 4.5 5.9* 5.2* 2.9   CBC  Recent Labs Lab 06/05/17 0845 06/07/17 1014 06/09/17 2249  WBC 7.8 2.7* 2.5*  HGB 9.0* 10.0* 10.0*  HCT 29.4* 32.5* 32.4*  MCV 108.5* 108.3* 108.4*  PLT 333 395 383   Medications:    . amiodarone  400 mg Oral BID  . darbepoetin (ARANESP) injection - DIALYSIS  60 mcg Intravenous Q Thu-HD  . famotidine  20 mg Oral QHS  . feeding supplement (PRO-STAT SUGAR FREE 64)  30 mL Oral Daily  . Gerhardt's butt cream   Topical TID  . hydrocerin   Topical BID  . levothyroxine  100 mcg Oral QAC breakfast  . megestrol  40 mg Oral BID  . metoprolol tartrate  50 mg Oral BID  . midodrine  10 mg Oral TID WC  . multivitamin  1 tablet Oral QHS  . polyethylene glycol  17 g Oral BID  . QUEtiapine  25 mg Oral QHS  . senna-docusate  2 tablet Oral BID  . sertraline  25 mg Oral QHS  . sucroferric oxyhydroxide  500 mg Oral TID WC   Elmarie Shiley, MD 06/10/2017, 11:10 AM

## 2017-06-10 NOTE — Progress Notes (Signed)
ANTICOAGULATION CONSULT NOTE - Follow Up Consult  Pharmacy Consult for Coumadin > Eliquis Indication: atrial fibrillation  No Known Allergies  Patient Measurements: Weight: 142 lb 3.2 oz (64.5 kg)  Vital Signs: Temp: 98.1 F (36.7 C) (09/02 0556) Temp Source: Oral (09/02 0556) BP: 94/70 (09/02 0556) Pulse Rate: 98 (09/02 0556)  Labs:  Recent Labs  06/08/17 0503 06/09/17 0603 06/09/17 2249 06/10/17 0525  HGB  --   --  10.0*  --   HCT  --   --  32.4*  --   PLT  --   --  383  --   LABPROT 27.2* 24.0*  --  22.9*  INR 2.55 2.17  --  2.04  CREATININE  --   --  3.85*  --     Estimated Creatinine Clearance: 13.1 mL/min (A) (by C-G formula based on SCr of 3.85 mg/dL (H)).  Assessment: 66 yo female with Afib on coumadin that is being transitioned to apixaban. INR is 2.04 today and has been continually trending down, so will likely be less than 2 this evening or early tomorrow. In anticipation of the INR trend, I will start the apixaban tomorrow morning. CBC is stable, no bleeding noted.  Plan:  Eliquis 5 mg BID Monitor for s/sx of bleeding   Patterson Hammersmith PharmD PGY1 Pharmacy Practice Resident 06/10/2017 3:27 PM Pager: [phone]

## 2017-06-11 ENCOUNTER — Inpatient Hospital Stay (HOSPITAL_COMMUNITY): Payer: Medicare Other | Admitting: Occupational Therapy

## 2017-06-11 ENCOUNTER — Inpatient Hospital Stay (HOSPITAL_COMMUNITY): Payer: Medicare Other | Admitting: Physical Therapy

## 2017-06-11 LAB — CBC WITH DIFFERENTIAL/PLATELET
BAND NEUTROPHILS: 0 %
BASOS ABS: 0 10*3/uL (ref 0.0–0.1)
BASOS PCT: 0 %
BLASTS: 0 %
EOS ABS: 0 10*3/uL (ref 0.0–0.7)
EOS PCT: 0 %
HCT: 35.6 % — ABNORMAL LOW (ref 36.0–46.0)
Hemoglobin: 10.8 g/dL — ABNORMAL LOW (ref 12.0–15.0)
LYMPHS ABS: 1.7 10*3/uL (ref 0.7–4.0)
Lymphocytes Relative: 55 %
MCH: 33.8 pg (ref 26.0–34.0)
MCHC: 30.3 g/dL (ref 30.0–36.0)
MCV: 111.3 fL — AB (ref 78.0–100.0)
METAMYELOCYTES PCT: 0 %
MONO ABS: 1.4 10*3/uL — AB (ref 0.1–1.0)
MONOS PCT: 45 %
Myelocytes: 0 %
NEUTROS ABS: 0 10*3/uL — AB (ref 1.7–7.7)
Neutrophils Relative %: 0 %
Other: 0 %
Platelets: 329 10*3/uL (ref 150–400)
Promyelocytes Absolute: 0 %
RBC: 3.2 MIL/uL — ABNORMAL LOW (ref 3.87–5.11)
RDW: 24 % — ABNORMAL HIGH (ref 11.5–15.5)
WBC: 3.1 10*3/uL — ABNORMAL LOW (ref 4.0–10.5)
nRBC: 11 /100 WBC — ABNORMAL HIGH

## 2017-06-11 NOTE — Discharge Instructions (Addendum)
Inpatient Rehab Discharge Instructions  [name] Discharge date and time: 06/19/17   Activities/Precautions/ Functional Status: Activity: no lifting, driving, or strenuous exercise till cleared by MD Diet: renal diet--limit fluids to 5 cups (1200 cc/day) Wound Care: 1 - Santyl with damp to dry dressing to areas on buttocks. Change daily.                        2.- Left lower leg wound care: Apply surgical lubricant to the adaptic, meshed layer and cover with 4 x 4 gauze, secure with kerlix and ACE wrapping. OK to weight bear on left leg.   Functional status:  ___ No restrictions     ___ Walk up steps independently _X__ 24/7 supervision/assistance   ___ Walk up steps with assistance ___ Intermittent supervision/assistance  ___ Bathe/dress independently ___ Walk with walker     _X__ Bathe/dress with assistance ___ Walk Independently    ___ Shower independently ___ Walk with assistance    ___ Shower with assistance _X__ No alcohol     ___ Return to work/school ________  Special Instructions: 1. Need to boost every 20 minutes when in chair for pressure relief on bottom. 2. Try to lie on side when in bed to keep pressure off buttocks.     COMMUNITY REFERRALS UPON DISCHARGE:    Home Health:   PT     OT     RN                      Agency:  [facility]    Phone: [phone]   Medical Equipment/Items Ordered:  Wheelchair, cushion, hospital bed, wide drop arm commode and transfer board                                                      Agency/Supplier:  [facility] @ [phone]      My questions have been answered and I understand these instructions. I will adhere to these goals and the provided educational materials after my discharge from the hospital.  Patient/Caregiver Signature _______________________________ Date __________  Clinician Signature _______________________________________ Date __________  Please bring this form and your medication list with you  to all your follow-up doctor's appointments.     Information on my medicine - ELIQUIS (apixaban)  This medication education was reviewed with me or my healthcare representative as part of my discharge preparation.  The pharmacist that spoke with me during my hospital stay was:  Patterson Hammersmith, [place]  Why was Eliquis prescribed for you? Eliquis was prescribed for you to reduce the risk of forming blood clots that can cause a stroke if you have a medical condition called atrial fibrillation (a type of irregular heartbeat) OR to reduce the risk of a blood clots forming after orthopedic surgery.  What do You need to know about Eliquis ? Take your Eliquis TWICE DAILY - one tablet in the morning and one tablet in the evening with or without food.  It would be best to take the doses about the same time each day.  If you have difficulty swallowing the tablet whole please discuss with your pharmacist how to take the medication safely.  Take Eliquis exactly as prescribed by your doctor and DO NOT stop taking Eliquis without talking to the  doctor who prescribed the medication.  Stopping may increase your risk of developing a new clot or stroke.  Refill your prescription before you run out.  After discharge, you should have regular check-up appointments with your healthcare provider that is prescribing your Eliquis.  In the future your dose may need to be changed if your kidney function or weight changes by a significant amount or as you get older.  What do you do if you miss a dose? If you miss a dose, take it as soon as you remember on the same day and resume taking twice daily.  Do not take more than one dose of ELIQUIS at the same time.  Important Safety Information A possible side effect of Eliquis is bleeding. You should call your healthcare provider right away if you experience any of the following: ? Bleeding from an injury or your nose that does not stop. ? Unusual colored urine (red or  dark brown) or unusual colored stools (red or black). ? Unusual bruising for unknown reasons. ? A serious fall or if you hit your head (even if there is no bleeding).  Some medicines may interact with Eliquis and might increase your risk of bleeding or clotting while on Eliquis. To help avoid this, consult your healthcare provider or pharmacist prior to using any new prescription or non-prescription medications, including herbals, vitamins, non-steroidal anti-inflammatory drugs (NSAIDs) and supplements.  This website has more information on Eliquis (apixaban): [email or web address].

## 2017-06-11 NOTE — Progress Notes (Signed)
Occupational Therapy Session Note  Patient Details  Name: [name] MRN: [id number] Date of Birth: December 20, 1950  Today's Date: 06/11/2017 OT Individual Time: 2703-5009 and 3818-2993 OT Individual Time Calculation (min): 57 min and 30 min   Short Term Goals: Week 1:  OT Short Term Goal 1 (Week 1): Pt will complete lateral leans with min assist and mod cues to decrease burden of care with toileting hygiene OT Short Term Goal 2 (Week 1): Pt will complete LB dressing at bed level with min assist OT Short Term Goal 3 (Week 1): Pt will complete slide board transfer to drop arm BSC with min assist  Skilled Therapeutic Interventions/Progress Updates:    1) Treatment session with focus on functional transfers and BUE strengthening as required for self-care tasks.  Pt received on [facility] with nurse tech present aiding in hygiene post toileting.  Returned to bed via slide board mod assist when going uphill.  Pt declined LB bathing and dressing, therefore transfer back to w/c with slide board with mod assist initially faded to min.  Increased time when donning and doffing shirt due to decreased UE strength and problem solving.  Pt attempted to don head through shirt hole, requiring cues to problem solve.  Grooming tasks completed with setup.  Engaged in 3 sets of 10 chest presses and bicep curls as well as lateral rows for UE strengthening, cues for proper technique.  2) Treatment session with focus on bed mobility, transfers, and BUE strengthening.  Pt received supine in bed reporting low BP earlier in day.  Assessed vitals in supine and sitting WNL (see flowsheets).  Completed bed mobility and slide board transfer with mod assist.  Engaged in UE arm bike for 8 mins total with 2 min forward and 2 min backward.  Pt required increased time and question cues to problem solve when to stop, having difficulty adding 4 +2.  Returned to room and left upright in w/c with all needs in reach.  Therapy  Documentation Precautions:  Precautions Precautions: Fall Precaution Comments: R IJ perm cath; wound vac LLE; LUE DVT Restrictions Weight Bearing Restrictions: No General:   Vital Signs: Therapy Vitals Temp: 97.8 F (36.6 C) Temp Source: Oral Pulse Rate: 90 Resp: 16 BP: 98/60 Patient Position (if appropriate): Lying Oxygen Therapy SpO2: 100 % O2 Device: Not Delivered Pain:  Pt with no c/o pain  See Function Navigator for Current Functional Status.   Therapy/Group: Individual Therapy  Ronya Gilcrest, [place] 06/11/2017, 8:30 AM

## 2017-06-11 NOTE — Progress Notes (Signed)
Assessment/ Plan:   1. Chronic systolic congestive heart failure/dilated cardiomyopathy: Volume improve, BP down with HD 2. ESRD: Continue dialysis on a Tuesday/Thursday/Saturday schedule with next dialysis tomorrow. Permanent access placement deferred to out-patient when clinically more stable.  3. Anemia:continue intravenous iron/ESA  4. Atrial fibrillation with RVR:  amiodarone and metoprolol- possible TEE guided cardioversion. 7. Hypotension: On midodrine, monitor with ultrafiltration and hemodialysis 8. Hypokalemia: recheck  Subjective: Interval History: Low BP with recent HD.  Objective: Vital signs in last 24 hours: Temp:  [97.6 F (36.4 [name])-97.8 F (36.6 [name])] 97.8 F (36.6 [name]) (09/03 0602) Pulse Rate:  [90-121] 121 (09/03 1020) Resp:  [16-20] 20 (09/03 1020) BP: (84-98)/(52-65) 84/52 (09/03 1020) SpO2:  [100 %] 100 % (09/03 1020) Weight:  [65 kg (143 lb 4.3 oz)] 65 kg (143 lb 4.3 oz) (09/03 0602) Weight change: -0.013 kg (-0.5 oz)  Intake/Output from previous day: 09/02 0701 - 09/03 0700 In: 764 [P.O.:764] Out: -  Intake/Output this shift: No intake/output data recorded.  General appearance: alert and cooperative Chest wall: no tenderness Cardio: irregularly irregular rhythm Extremities: edema 1+  Lab Results:  Recent Labs  06/09/17 2249 06/11/17 0419  WBC 2.5* 3.1*  HGB 10.0* 10.8*  HCT 32.4* 35.6*  PLT 383 329   BMET:  Recent Labs  06/09/17 2249  NA 135  K 2.8*  CL 97*  CO2 26  GLUCOSE 100*  BUN 32*  CREATININE 3.85*  CALCIUM 8.3*   No results for input(s): PTH in the last 72 hours. Iron Studies: No results for input(s): IRON, TIBC, TRANSFERRIN, FERRITIN in the last 72 hours. Studies/Results: No results found.  Scheduled: . amiodarone  400 mg Oral BID  . apixaban  5 mg Oral BID  . darbepoetin (ARANESP) injection - DIALYSIS  60 mcg Intravenous Q Thu-HD  . famotidine  20 mg Oral QHS  . feeding supplement (PRO-STAT SUGAR FREE 64)  30 mL Oral  Daily  . Gerhardt's butt cream   Topical TID  . hydrocerin   Topical BID  . levothyroxine  100 mcg Oral QAC breakfast  . megestrol  40 mg Oral BID  . metoprolol tartrate  50 mg Oral BID  . midodrine  10 mg Oral TID WC  . multivitamin  1 tablet Oral QHS  . polyethylene glycol  17 g Oral BID  . QUEtiapine  25 mg Oral QHS  . senna-docusate  2 tablet Oral BID  . sertraline  25 mg Oral QHS  . sucroferric oxyhydroxide  500 mg Oral TID WC    LOS: 5 days   [name] 06/11/2017,12:32 PM

## 2017-06-11 NOTE — Progress Notes (Signed)
Attempted to reach physician regarding BP med order d/t mild hypotension. Unable to reach at this time. Will notify primary RN.

## 2017-06-11 NOTE — Progress Notes (Signed)
Spoke with PA regarding blood pressures and lopressor med. Was instructed to give lopressor so long as patient asymptomatic and that patient receiving midodrine. Lopressor given at this time.

## 2017-06-11 NOTE — Progress Notes (Addendum)
Physical Therapy Note  Patient Details  Name: [name] MRN: [id number] Date of Birth: 01/10/1951 Today's Date: 06/11/2017    Time: 1100-1134 34 minutes  1:1 No c/o pain. Pt c/o feeling nauseous but agreeable to participate in treatment. Pt performed squat pivot transfer with mod/max A, increased cuing for UE placement, increased time to follow commands.  Sit to stand from elevated surface x 3 with max A, cues each attempt for UE placement.  Attempt horseshoe reach in standing, pt unable to stand with only 1 UE support, c/o fatigue. Performed horseshoe reach seated with frequent rests.  Stand pivot transfer training with blocked practice with increased cues for each step of transfer, max A to stand and mod A for standing balance with turning each attempt. Pt left in bed with needs at hand and husband present.   Time 2: 1400-1439 39 minutes  1:1 No c/o pain.  BP continues low 84/65, RN aware. Pt with irregular heart rate but no c/o symptoms, RN advises to continue therapy.  Scoot pivot transfers with max A throughout session.  Sit to stand training x 7 from w/c level requiring +2 assist with pt able to stand x 60 seconds at most.  Pt with more c/o fatigue this session.  Pt max A to transfer back to bed and max A bed mobility at end of session.  Makar Slatter 06/11/2017, 11:36 AM

## 2017-06-11 NOTE — Progress Notes (Signed)
[facility]ICAL [facility]ICINE & REHABILITATION     PROGRESS NOTE  Subjective/Complaints:  Pt seen laying in bed this AM.  She slept well overnight.  She tells me the same story about HD as if it was yesterday.  When reminded she mentioned that to me the day before, she states, "maybe it was 2 days ago".    ROS: Denies CP, SOB, N/V/D.  Objective: Vital Signs: Blood pressure 98/60, pulse 90, temperature 97.8 F (36.6 C), temperature source Oral, resp. rate 16, weight 65 kg (143 lb 4.3 oz), SpO2 100 %. No results found.  Recent Labs  06/09/17 2249 06/11/17 0419  WBC 2.5* 3.1*  HGB 10.0* 10.8*  HCT 32.4* 35.6*  PLT 383 329    Recent Labs  06/09/17 2249  NA 135  K 2.8*  CL 97*  GLUCOSE 100*  BUN 32*  CREATININE 3.85*  CALCIUM 8.3*   CBG (last 3)  No results for input(s): GLUCAP in the last 72 hours.  Wt Readings from Last 3 Encounters:  06/11/17 65 kg (143 lb 4.3 oz)  06/05/17 72.5 kg (159 lb 13.3 oz)  06/02/17 64.5 kg (142 lb 3.2 oz)    Physical Exam:  BP 98/60 (BP Location: Right Arm)   Pulse 90   Temp 97.8 F (36.6 C) (Oral)   Resp 16   Wt 65 kg (143 lb 4.3 oz)   SpO2 100%   BMI 23.84 kg/m  Constitutional: She appears well-developed and well-nourished. No distress.  HENT: Normocephalic and atraumatic.  Eyes: EOMI. No discharge.  Cardiovascular: IRIR.No JVD Respiratory:  Resp: Effort normal and breath sounds normal.  GI: Bowel sounds are normal. She exhibits no distension. Musculoskeletal: She exhibits no edema or tenderness.  Neurological: She is alert and oriented.  Motor: B/l UE: 4+/5 proximal to distal B/l LE: HF 3-/5, KE 3/5, ADF/PF 3+/5 (stable) Skin: Skin is warm and dry. She is not diaphoretic. Right foot with healed abrasions. Left shin/foot +VAC.   Psychiatric: She has a normal mood and affect. Her behavior is normal. Judgment and thought content normal.    Assessment/Plan: 1. 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 Physiatrist is providing close team supervision and 24 hour management of active medical problems listed below. Physiatrist and rehab team continue to assess barriers to discharge/monitor patient progress toward functional and medical goals.  Function:  Bathing Bathing position   Position: Wheelchair/chair at sink (UB only)  Bathing parts Body parts bathed by patient: Right arm, Left arm, Chest, Abdomen Body parts bathed by helper: Back  Bathing assist Assist Level: Set up   Set up : To obtain items  Upper Body Dressing/Undressing Upper body dressing   What is the patient wearing?: Pull over shirt/dress     Pull over shirt/dress - Perfomed by patient: Thread/unthread left sleeve, Put head through opening, Thread/unthread right sleeve, Pull shirt over trunk Pull over shirt/dress - Perfomed by helper: Pull shirt over trunk        Upper body assist Assist Level: Supervision or verbal cues   Set up : To obtain clothing/put away  Lower Body Dressing/Undressing Lower body dressing   What is the patient wearing?: Pants, Non-skid slipper socks     Pants- Performed by patient: Pull pants up/down Pants- Performed by helper: Thread/unthread right pants leg, Thread/unthread left pants leg, Pull pants up/down Non-skid slipper socks- Performed by patient: Don/doff right sock Non-skid slipper socks- Performed  by helper: Don/doff left sock                  Lower body assist Assist for lower body dressing:  (Total assist)      Toileting Toileting     Toileting steps completed by helper: Adjust clothing prior to toileting, Performs perineal hygiene, Adjust clothing after toileting    Toileting assist     Transfers Chair/bed transfer Chair/bed transfer activity did not occur: Safety/medical concerns (incontinent of bowel, time contraints) Chair/bed transfer method: Lateral scoot Chair/bed transfer assist  level: Moderate assist (Pt 50 - 74%/lift or lower) Chair/bed transfer assistive device: Sliding board     Locomotion Ambulation Ambulation activity did not occur: Safety/medical concerns (incontinent of bowel, time constraints)   Max distance: 5 Assist level: 2 helpers   Oceanographer activity did not occur: Safety/medical concerns (incontinent of bowel; time constraints)        Cognition Comprehension Comprehension assist level: Understands complex 90% of the time/cues 10% of the time  Expression Expression assist level: Expresses complex ideas: With extra time/assistive device  Social Interaction Social Interaction assist level: Interacts appropriately with others with medication or extra time (anti-anxiety, antidepressant).  Problem Solving Problem solving assist level: Solves basic 90% of the time/requires cueing < 10% of the time  Memory Memory assist level: Recognizes or recalls 75 - 89% of the time/requires cueing 10 - 24% of the time    Medical Problem List and Plan: 1.  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  Cont CIR 2.  DVT Prophylaxis/Anticoagulation: Pharmaceutical: Cont Eliquis  3. Pain Management: tylenol prn  4. Mood: Remains motivated. LCSW to follow for support.  5. Neuropsych: This patient is not fully capable of making decisions on her own behalf. 6. Skin/Wound Care: Maintain adequate nutritional status to help promote healing. VAC dressing changes per plastics.  7. Fluids/Electrolytes/Nutrition: Monitor I/Os. Continue megace to help with appetite. On cardiac diet to help with food choices--add 1200 cc/FR 8. A fib with RVR: To continue amiodarone at 400 mg bid and metoprolol 50 mg bid.  9. CKD, now dialysis dependent: AVG postponed for now. HD dependent--Tue/Thu/Sat schedule at end of day to help with tolerance of therapy. 10. Hypotension: On amiodarone 10 mg tid.  Relatively controlled 9/3 11. Anemia of chronic disease:  IV iron for iron deficiency. On aranesp weekly.  Labs with HD  Hb 10.8 on 9/3 12. Left foot wound: Wound VAC over Acell for healing--dressing changes weekly by plastics.  13. Anxiety/depression: Team to provide ego support.  Continue Seroquel and Zoloft.  14. Constipation: Continue miralax and linzess. Increase senna S to bid.  15. Leukopenia  WBCs 3.1 on  9/3  Cont to monitor  Differential pending  LOS (Days) 5 A FACE TO FACE EVALUATION WAS PERFORMED  Ankit Lorie Phenix 06/11/2017 8:55 AM

## 2017-06-12 ENCOUNTER — Encounter (HOSPITAL_COMMUNITY): Payer: Self-pay | Admitting: Radiology

## 2017-06-12 ENCOUNTER — Inpatient Hospital Stay (HOSPITAL_COMMUNITY): Payer: Medicare Other | Admitting: Physical Therapy

## 2017-06-12 ENCOUNTER — Inpatient Hospital Stay (HOSPITAL_COMMUNITY): Payer: Medicare Other

## 2017-06-12 ENCOUNTER — Inpatient Hospital Stay (HOSPITAL_COMMUNITY): Payer: Medicare Other | Admitting: Occupational Therapy

## 2017-06-12 DIAGNOSIS — D708 Other neutropenia: Secondary | ICD-10-CM

## 2017-06-12 LAB — BASIC METABOLIC PANEL
Anion gap: 14 (ref 5–15)
BUN: 40 mg/dL — ABNORMAL HIGH (ref 6–20)
CALCIUM: 9.2 mg/dL (ref 8.9–10.3)
CO2: 22 mmol/L (ref 22–32)
CREATININE: 4.37 mg/dL — AB (ref 0.44–1.00)
Chloride: 102 mmol/L (ref 101–111)
GFR calc non Af Amer: 10 mL/min — ABNORMAL LOW (ref >60)
GFR, EST AFRICAN AMERICAN: 11 mL/min — AB (ref >60)
Glucose, Bld: 106 mg/dL — ABNORMAL HIGH (ref 65–99)
Potassium: 6 mmol/L — ABNORMAL HIGH (ref 3.5–5.1)
SODIUM: 138 mmol/L (ref 135–145)

## 2017-06-12 MED ORDER — IOPAMIDOL (ISOVUE-300) INJECTION 61%
INTRAVENOUS | Status: AC
  Administered 2017-06-12: 75 mL
  Filled 2017-06-12: qty 75

## 2017-06-12 MED ORDER — IOPAMIDOL (ISOVUE-300) INJECTION 61%
INTRAVENOUS | Status: AC
  Filled 2017-06-12: qty 30

## 2017-06-12 MED ORDER — MIDODRINE HCL 5 MG PO TABS
ORAL_TABLET | ORAL | Status: AC
  Administered 2017-06-12: 10 mg via ORAL
  Filled 2017-06-12: qty 2

## 2017-06-12 NOTE — Progress Notes (Signed)
Pt's potassium is 6.0 pre HD today.  Dr. Mercy Moore notified and orders given to run pt on a 2K+ bath today.

## 2017-06-12 NOTE — Progress Notes (Signed)
Per Radiology tech, pt cleared to have IV contrast per Nephro.  Agree with Nephro to proceed with IV contrast for CT abd/pelvis.

## 2017-06-12 NOTE — Progress Notes (Signed)
Occupational Therapy Session Note  Patient Details  Name: [name] MRN: [id number] Date of Birth: 1951-02-06  Today's Date: 06/12/2017 OT Individual Time: 7408-1448 OT Individual Time Calculation (min): 62 min    Short Term Goals: Week 1:  OT Short Term Goal 1 (Week 1): Pt will complete lateral leans with min assist and mod cues to decrease burden of care with toileting hygiene OT Short Term Goal 2 (Week 1): Pt will complete LB dressing at bed level with min assist OT Short Term Goal 3 (Week 1): Pt will complete slide board transfer to drop arm BSC with min assist  Skilled Therapeutic Interventions/Progress Updates:    Pt seen to focus on self-care/ADL tasks, bed mobility, and functional transfers. Pt with no c/o of pain but reported feeling "a little tired" upon arrival. Pt willing to participate in therapy session. Pt able to complete LB bathing at bed level with min. A and set up for functional positioning to complete task. Therapist provided mod verbal cues to promote independence in recall/carryover of bed mobility. Pt able to complete LB dressing with min A for LE placement and to thread LLE due to difficulty threading with non-skid sock on. Supine > EOB with mod. A due to decreased strength and sequencing. Slide board transfer bed > w/c light mod. A and verbal cues for sequencing. Pt able to complete UB bathing and dressing with supervision/set-up in w/c at sink level. Therapist provided verbal cues for problem-solving with UB dressing. Able to complete grooming task with set-up. Pt left in w/c with breakfast tray and all needs in reach.  Therapy Documentation Precautions:  Precautions Precautions: Fall Precaution Comments: R IJ perm cath; wound vac LLE; LUE DVT Restrictions Weight Bearing Restrictions: No    See Function Navigator for Current Functional Status.   Therapy/Group: Individual Therapy  Hoyt Koch 06/12/2017, 12:07 PM

## 2017-06-12 NOTE — ED Notes (Signed)
74 CC extravasation, right ac. Ice pack to area.

## 2017-06-12 NOTE — Progress Notes (Signed)
Physical Therapy Note  Patient Details  Name: Dustin Burrill MRN: [id number] Date of Birth: 04-07-1951 Today's Date: 06/12/2017    Time: [phone] 60 minutes  1:1 No c/o pain.  W/c mobility in controlled environment with supervision 25' x 2.  Pt requires min A and max cuing for obstacle negotiation each attempt, no carryover during session.  Sliding board transfer training to 20'' with min A, 22'' mod A, 25'' x 2 with max A.  Pt shown height of car and performed sit to stand from 32'', then unable to get back up into 32'' seat. Pt made aware of PT recommendation to borrow a car or use alternate transportation as it is not safe to attempt car transfer for d/c home. Pt states understanding.  Standing balance with 1 UE support for reaching task with pt able to reach and remain standing 90 seconds x 2.  Pt continues with low BP (91/67)  causing increased fatigue throughout session.   Kendarius Vigen 06/12/2017, 10:32 AM

## 2017-06-12 NOTE — Progress Notes (Signed)
Occupational Therapy Session Note  Patient Details  Name: [name] MRN: [id number] Date of Birth: 1951/08/08  Today's Date: 06/12/2017 OT Individual Time: 4098-1191 OT Individual Time Calculation (min): 25 min    Short Term Goals: Week 1:  OT Short Term Goal 1 (Week 1): Pt will complete lateral leans with min assist and mod cues to decrease burden of care with toileting hygiene OT Short Term Goal 2 (Week 1): Pt will complete LB dressing at bed level with min assist OT Short Term Goal 3 (Week 1): Pt will complete slide board transfer to drop arm BSC with min assist  Skilled Therapeutic Interventions/Progress Updates:    Pt seen to focus on BUE strength and ROM to promote improvement in bed/functional mobility and self-care/ADLs. Pt received in w/c with no c/o pain and willing to participate in therapy session. Pt able to complete slide board transfer w/c<> therapy mat with light mod. A due to decreased strength. Pt able to propel w/c ~ 35' with min verbal cues for w/c steering with focus on BUE strengthening and simple problem-solving. Pt participated in therapeutic activity of ball toss on therapy mat with no resistance of BUE. Pt able to catch and toss ball when in midline of pt. However, due to lack of BUE ROM, pt was unable to catch ball when tossed on either the left or right side. Pt tolerated ~7 minutes of activity before requesting to go back to her room due to possible decreased motivation. BP read 99/77. Pt reported feeling "tired". Pt left in room in w/c with lunch tray and call bell in place. Therapist encouraged pt to eat her lunch.  Therapy Documentation Precautions:  Precautions Precautions: Fall Precaution Comments: R IJ perm cath; wound vac LLE; LUE DVT Restrictions Weight Bearing Restrictions: No    See Function Navigator for Current Functional Status.   Therapy/Group: Individual Therapy  Hoyt Koch 06/12/2017, 12:20 PM

## 2017-06-12 NOTE — Progress Notes (Signed)
Recreational Therapy Assessment and Plan  Patient Details  Name: [name] MRN: [id number] Date of Birth: [birth year] Today's Date: 06/12/2017  Rehab Potential:  Good  ELOS: 7 days Assessment  Problem List:      Patient Active Problem List   Diagnosis Date Noted  . Slow transit constipation   . Acute blood loss anemia   . Splinter of foot without major open wound or infection   . Dependence on renal dialysis ([place])   . Chronic kidney disease (CKD), stage IV (severe) ([place])   . Atrial fibrillation with rapid ventricular response ([place])   . Chronic anticoagulation   . Pressure injury of skin 06/04/2017  . Chronic systolic CHF (congestive heart failure) ([place]) 06/03/2017  . Atrial fibrillation with RVR ([place]) 06/02/2017  . ESRD (end stage renal disease) ([place])   . Vaginal discharge   . AKI (acute kidney injury) ([place])   . Bleeding   . A-fib ([place])   . Abnormal urinalysis   . Dependence on hemodialysis ([place])   . Ulcer of left lower extremity with fat layer exposed ([name])   . Skin ulcer of left foot with fat layer exposed ([place])   . Vaginal bleeding   . Hypoglycemia associated with diabetes ([name])   . Skin ulcer of right heel ([name])   . PAF (paroxysmal atrial fibrillation) ([name])   . Hypotension   . Leukocytosis   . Anemia of chronic disease   . Anxiety state   . Depression   . Prediabetes   . Stage 5 chronic kidney disease not on chronic dialysis ([place])   . 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 Pressure sore on buttocks 04/19/2017  . Acute renal failure (ARF) ([place])   . Cellulitis of lower extremity   . Edema extremities   . Acute respiratory failure ([place])   . Acute renal failure ([place])   . Multi-organ failure with heart failure ([place])   . Palliative care by specialist   . Goals of care, counseling/discussion   . Septic shock ([place]) 04/08/2017    Past Medical History:      Past Medical History:  Diagnosis Date  . A-fib ([name])    on  pradaxa  . Chronic kidney disease   . Dysrhythmia   . Hypertension   . Hypothyroidism   . Thyroid disease    Past Surgical History:       Past Surgical History:  Procedure Laterality Date  . APPLICATION OF A-CELL OF EXTREMITY Left 05/28/2017   Procedure: APPLICATION OF A-CELL OF LEFT LOWER EXTREMITY;  Surgeon: Wallace Going, DO;  Location: [facility];  Service: Plastics;  Laterality: Left;  . APPLICATION OF WOUND VAC Left 05/28/2017   Procedure: APPLICATION OF WOUND VAC;  Surgeon: Wallace Going, DO;  Location: [facility];  Service: Plastics;  Laterality: Left;  . DIALYSIS/PERMA CATHETER INSERTION N/A 04/26/2017   Procedure: Dialysis/Perma Catheter Insertion;  Surgeon: Algernon Huxley, MD;  Location: [facility];  Service: Cardiovascular;  Laterality: N/A;  . INCISION AND DRAINAGE OF WOUND Left 05/28/2017   Procedure: IRRIGATION AND DEBRIDEMENT LEFT LOWER LEG WOUND;  Surgeon: Wallace Going, DO;  Location: [facility];  Service: Plastics;  Laterality: Left;  . PR DEBRIDEMENT, SKIN, SUB-Q TISSUE,=<20 SQ CM  05/21/2017      . PR DEBRIDEMENT, SKIN, SUB-Q TISSUE,EACH ADD 20 SQ CM  05/21/2017      . WRIST ARTHROSCOPY      Assessment & Plan Clinical Impression: Patient is a 66 y.o. year  old female with history of HTN, A fib-on pradaxa who was admitted on [facility] on 04/08/17 due to BLE cellulitis with difficulty walking. She was found to be septic with leucocytosis, hypotensive with two week history of DOE and BLE edema with cellulitis. She was started on broad spectrum antibiotics and was noted to be volume overloaded with right pleural effusion requiring CRRT. Hospital course significant for PEA arrest 7/4, ARDS, anuria requiring IJ placement by vascular surgery as now HD dependent, shocked liver with abnormal LFTs, thrombocytopenia, ongoing issues with hypotension and tachycardia. She developed BUE edema due to "occlusive deep vein thrombosis in the left cephalic vein  extending from the mid humeral region down towards the elbow". She has had issues with A fib with RVR and placed on amiodarone and Cardizem for HR control and Dr. [name] recommended coumadin or low dose Eliquis due to A fib/stroke prophylaxis. Serologic work up negative and she was HD dependent. 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  She was admitted to [facility] on 05/08/17 for intensive rehab program. She was noted to have BLE ulcers that have been monitored. HD has been ongoing with minimal renal recovery. She did develop vaginal bleeding and vaginal/pelvic ultrasound revealed "markedly abnormal and thickened endometrium containing a focal 3.7 cm hyperechoic mass". Gynecology was consulted and recommended outpatient follow up with biopsy. Left leg wound was debrided at bedside with minimal results therefore Dr. [name] was consulted for assistance. She was transitioned from Eliquis to coumadin and taken to OR for debridement with placement of Acell and VAC on 05/28/17. She developed A fib with flutter and was transferred to acute for treatment on 8/26. Amiodarone and metoprolol doses were increased for rate with HR currently in 90- 110 range. Dr. [name] was consulted for placement of AVG but INR remains therapeutic and plans for graft in 6 weeks on outpatient basis. She was cleared to resume activity and admitted back to resume CIR program.  Patient transferred to CIR on 06/06/2017.    Pt presents with decreased activity tolerance, decreased functional mobility, decreased balance Limiting pt's independence with leisure/community pursuits.  Plan Min 1 TR session/group >25 minutes during LOS  Recommendations for other services: Neuropsych  Discharge Criteria: Patient will be discharged from TR if patient refuses treatment 3 consecutive times without medical reason.  If treatment goals not met, if there  is a change in medical status, if patient makes no progress towards goals or if patient is discharged from hospital.  The above assessment, treatment plan, treatment alternatives and goals were discussed and mutually agreed upon: by patient  Newcomb 06/12/2017, 2:58 PM

## 2017-06-12 NOTE — Progress Notes (Signed)
Assessment/ Plan:   1. Chronic systolic congestive heart failure/dilated cardiomyopathy: Volume improved, BP down with HD 2. ESRD: Continue dialysis on a Tuesday/Thursday/Saturday schedule Permanent access placement deferred to out-patient when clinically more stable.  3. Anemia:continue intravenous iron/ESA  4. Atrial fibrillation with RVR:  amiodarone and metoprolol 5. Hypotension:On midodrine, monitor with ultrafiltration and hemodialysis 6.Hypokalemia: recheck  Subjective: Interval History: Working with rehab  Objective: Vital signs in last 24 hours: Temp:  [97.6 F (36.4 C)-98.2 F (36.8 C)] 98.2 F (36.8 C) (09/04 1856) Pulse Rate:  [83-113] 113 (09/04 2051) Resp:  [18] 18 (09/04 1856) BP: (73-101)/(21-67) 99/65 (09/04 2051) SpO2:  [100 %] 100 % (09/04 1856) Weight:  [63 kg (138 lb 14.2 oz)-68 kg (149 lb 14.6 oz)] 68 kg (149 lb 14.6 oz) (09/04 1516) Weight change: -1.987 kg (-4 lb 6.1 oz)  Intake/Output from previous day: 09/03 0701 - 09/04 0700 In: 240 [P.O.:240] Out: 0  Intake/Output this shift: No intake/output data recorded.   thin female Kyphosis Ext reduced edema to 1+    Lab Results:  Recent Labs  06/09/17 2249 06/11/17 0419  WBC 2.5* 3.1*  HGB 10.0* 10.8*  HCT 32.4* 35.6*  PLT 383 329   BMET:  Recent Labs  06/09/17 2249 06/12/17 1529  NA 135 138  K 2.8* 6.0*  CL 97* 102  CO2 26 22  GLUCOSE 100* 106*  BUN 32* 40*  CREATININE 3.85* 4.37*  CALCIUM 8.3* 9.2   No results for input(s): PTH in the last 72 hours. Iron Studies: No results for input(s): IRON, TIBC, TRANSFERRIN, FERRITIN in the last 72 hours. Studies/Results: No results found.  Scheduled: . iopamidol      . iopamidol      . amiodarone  400 mg Oral BID  . apixaban  5 mg Oral BID  . darbepoetin (ARANESP) injection - DIALYSIS  60 mcg Intravenous Q Thu-HD  . famotidine  20 mg Oral QHS  . feeding supplement (PRO-STAT SUGAR FREE 64)  30 mL Oral Daily  . Gerhardt's butt cream    Topical TID  . hydrocerin   Topical BID  . levothyroxine  100 mcg Oral QAC breakfast  . megestrol  40 mg Oral BID  . metoprolol tartrate  50 mg Oral BID  . midodrine  10 mg Oral TID WC  . multivitamin  1 tablet Oral QHS  . polyethylene glycol  17 g Oral BID  . QUEtiapine  25 mg Oral QHS  . senna-docusate  2 tablet Oral BID  . sertraline  25 mg Oral QHS  . sucroferric oxyhydroxide  500 mg Oral TID WC      LOS: 6 days   Zienna Ahlin C 06/12/2017,9:14 PM

## 2017-06-12 NOTE — Consult Note (Signed)
[place]  [phone]   HEMATOLOGY ONCOLOGY INPATIENT CONSULTATION   [name]  DOB: Feb 27, 1951  MR#: [id number]  CSN#: [id number]    Requesting Physician: rehab Dr. 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 I was called to evaluate her newly onset neutropenia.   She was admitted on [facility] on 04/08/17 due to BLE cellulitis with difficulty walking. She was found to be septic with leucocytosis, hypotensive and multiple organ failure, including renal failure required hemodialysis, PEA arrest 7/4, ARDS, shocked liver with abnormal LFTs, thrombocytopenia, ongoing issues with hypotension and tachycardia. She finally stabilized and She was admitted to rehab on 05/08/17. She was noted to have BLE ulcers that have been monitored. HD has been ongoing with minimal renal recovery. She did develop vaginal bleeding and vaginal/pelvic ultrasound revealed "markedly abnormal and thickened endometrium containing a focal 3.7 cm hyperechoic mass". Gynecology was consulted and recommended outpatient follow up with biopsy. Left leg wound was debrided at bedside with minimal results therefore Dr. [name] was consulted for assistance. She was transitioned from Eliquis to coumadin and taken to OR for debridement with placement of Acell and VAC on 05/28/17.  She developed A fib with flutter and was transferred to acute for treatment on 8/26. Amiodarone and metoprolol doses were increased for rate with HR currently in 90- 110 range.  Dr. [name] was consulted for placement of AVG but INR remains therapeutic and plans for graft in 6 weeks on outpatient basis.  She was cleared to  resume activity and admitted back to resume CIR program on 06/06/2017.   Her initial CBC on admission 04/08/2017 showed WBC 13.2K, with absolute neutrophil 12.0K, hemoglobin 15, platelets 142. Through her prolonged hospital stay, she gradually developed anemia, macrocytosis. Her WBC has been normal until 5 days ago, when WBC dropped to 2.7K, today CBC showed WBC 3.1, ANC 0, HB stable at 10.8, plt normal. She denies any fever or chills, or recent infection.   MEDICAL HISTORY:  Past Medical History:  Diagnosis Date  . A-fib ([place])    on pradaxa  . Chronic kidney disease   . Dysrhythmia   . Hypertension   . Hypothyroidism   . Thyroid disease     SURGICAL HISTORY: Past Surgical History:  Procedure Laterality Date  . APPLICATION OF A-CELL OF EXTREMITY Left 05/28/2017   Procedure: APPLICATION OF A-CELL OF LEFT LOWER EXTREMITY;  Surgeon: Wallace Going, DO;  Location: [place];  Service: Plastics;  Laterality: Left;  . APPLICATION OF WOUND VAC Left 05/28/2017   Procedure: APPLICATION OF WOUND VAC;  Surgeon: Wallace Going, DO;  Location: [place];  Service: Plastics;  Laterality: Left;  . DIALYSIS/PERMA CATHETER INSERTION N/A 04/26/2017   Procedure: Dialysis/Perma Catheter Insertion;  Surgeon: Algernon Huxley, MD;  Location: [place] [facility];  Service: Cardiovascular;  Laterality: N/A;  . INCISION AND DRAINAGE OF WOUND Left 05/28/2017   Procedure: IRRIGATION AND DEBRIDEMENT LEFT LOWER LEG WOUND;  Surgeon: Wallace Going, DO;  Location: [place];  Service: Plastics;  Laterality: Left;  . PR DEBRIDEMENT, SKIN, SUB-Q TISSUE,=<20 SQ CM  05/21/2017      . PR DEBRIDEMENT, SKIN, SUB-Q TISSUE,EACH ADD 20 SQ  CM  05/21/2017      . WRIST ARTHROSCOPY      SOCIAL HISTORY: Social History   Social History  . Marital status: Married    Spouse name: N/A  . Number of children: N/A  . Years of education: N/A   Occupational History  . Not on file.   Social History Main Topics  . Smoking status:  Never Smoker  . Smokeless tobacco: Never Used  . Alcohol use No  . Drug use: No  . Sexual activity: Not Currently   Other Topics Concern  . Not on file   Social History Narrative  . No narrative on file    FAMILY HISTORY: Family History  Problem Relation Age of Onset  . AAA (abdominal aortic aneurysm) Mother     ALLERGIES:  has No Known Allergies.  MEDICATIONS:  Current Facility-Administered Medications  Medication Dose Route Frequency Provider Last Rate Last Dose  . acetaminophen (TYLENOL) tablet 325-650 mg  325-650 mg Oral Q4H PRN Bary Leriche, PA-C      . ALPRAZolam Duanne Moron) tablet 0.25 mg  0.25 mg Oral TID PRN Love, Pamela S, PA-C      . alum & mag hydroxide-simeth (MAALOX/MYLANTA) 200-200-20 MG/5ML suspension 30 mL  30 mL Oral Q4H PRN Love, Pamela S, PA-C      . amiodarone (PACERONE) tablet 400 mg  400 mg Oral BID Love, Pamela S, PA-C   400 mg at 06/13/17 1038  . apixaban (ELIQUIS) tablet 5 mg  5 mg Oral BID [name], MD   5 mg at 06/13/17 1039  . bisacodyl (DULCOLAX) suppository 10 mg  10 mg Rectal Daily PRN Love, Pamela S, PA-C      . collagenase (SANTYL) ointment   Topical Daily [name], MD      . Darbepoetin Alfa (ARANESP) injection 60 mcg  60 mcg Intravenous Q Thu-HD Bary Leriche, PA-C   60 mcg at 06/07/17 1818  . diphenhydrAMINE (BENADRYL) 12.5 MG/5ML elixir 12.5-25 mg  12.5-25 mg Oral Q6H PRN Love, Pamela S, PA-C      . famotidine (PEPCID) tablet 20 mg  20 mg Oral QHS Love, Pamela S, PA-C   20 mg at 06/12/17 2346  . feeding supplement (PRO-STAT SUGAR FREE 64) liquid 30 mL  30 mL Oral Daily [name], MD   30 mL at 06/12/17 0854  . ferric gluconate (NULECIT) 125 mg in sodium chloride 0.9 % 100 mL IVPB  125 mg Intravenous Q T,Th,Sa-HD Bary Leriche, PA-C 110 mL/hr at 06/12/17 1758 125 mg at 06/12/17 1758  . guaiFENesin-dextromethorphan (ROBITUSSIN DM) 100-10 MG/5ML syrup 5-10 mL  5-10 mL Oral Q6H PRN Love, Pamela S, PA-C      . hydrocerin  (EUCERIN) cream   Topical BID Love, Pamela S, PA-C      . ipratropium-albuterol (DUONEB) 0.5-2.5 (3) MG/3ML nebulizer solution 3 mL  3 mL Nebulization Q4H PRN Love, Pamela S, PA-C      . levothyroxine (SYNTHROID, LEVOTHROID) tablet 100 mcg  100 mcg Oral QAC breakfast Reesa Chew S, PA-C   100 mcg at 06/13/17 1038  . megestrol (MEGACE) tablet 40 mg  40 mg Oral BID Bary Leriche, PA-C   40 mg at 06/13/17 1038  . metoprolol tartrate (LOPRESSOR) tablet 50 mg  50 mg Oral BID Reesa Chew S, PA-C   50 mg at 06/13/17 1038  . midodrine (PROAMATINE) tablet 10 mg  10 mg Oral TID WC Love, Ivan Anchors, PA-C  10 mg at 06/13/17 1737  . multivitamin (RENA-VIT) tablet 1 tablet  1 tablet Oral QHS Bary Leriche, PA-C   1 tablet at 06/12/17 2347  . polyethylene glycol (MIRALAX / GLYCOLAX) packet 17 g  17 g Oral BID Bary Leriche, PA-C   17 g at 06/13/17 1037  . prochlorperazine (COMPAZINE) tablet 5-10 mg  5-10 mg Oral Q6H PRN Love, Pamela S, PA-C       Or  . prochlorperazine (COMPAZINE) injection 5-10 mg  5-10 mg Intramuscular Q6H PRN Love, Pamela S, PA-C       Or  . prochlorperazine (COMPAZINE) suppository 12.5 mg  12.5 mg Rectal Q6H PRN Love, Pamela S, PA-C      . QUEtiapine (SEROQUEL) tablet 25 mg  25 mg Oral QHS Love, Pamela S, PA-C   25 mg at 06/12/17 2346  . senna-docusate (Senokot-S) tablet 2 tablet  2 tablet Oral BID Bary Leriche, PA-C   2 tablet at 06/13/17 1038  . sertraline (ZOLOFT) tablet 25 mg  25 mg Oral QHS Bary Leriche, PA-C   25 mg at 06/12/17 2347  . sucroferric oxyhydroxide (VELPHORO) chewable tablet 500 mg  500 mg Oral TID WC Love, Ivan Anchors, PA-C   500 mg at 06/13/17 1738  . traZODone (DESYREL) tablet 25-50 mg  25-50 mg Oral QHS PRN Love, Pamela S, PA-C        REVIEW OF SYSTEMS:   Constitutional: Denies fevers, chills or abnormal night sweats Eyes: Denies blurriness of vision, double vision or watery eyes Ears, nose, mouth, throat, and face: Denies mucositis or sore throat Respiratory:  Denies cough, dyspnea or wheezes Cardiovascular: Denies palpitation, chest discomfort or lower extremity swelling Gastrointestinal:  Denies nausea, heartburn or change in bowel habits Skin: Denies abnormal skin rashes Lymphatics: Denies new lymphadenopathy or easy bruising Neurological:Denies numbness, tingling or new weaknesses Behavioral/Psych: Mood is stable, no new changes  All other systems were reviewed with the patient and are negative.  PHYSICAL EXAMINATION: ECOG PERFORMANCE STATUS: 3 - Symptomatic, >50% confined to bed  Vitals:   06/13/17 1114 06/13/17 1555  BP: 91/65 102/79  Pulse: 89 97  Resp:  19  Temp:  98 F (36.7 C)  SpO2:  100%   Filed Weights   06/12/17 0515 06/12/17 1516 06/13/17 0420  Weight: 138 lb 14.2 oz (63 kg) 149 lb 14.6 oz (68 kg) 154 lb 5.2 oz (70 kg)    GENERAL:alert, no distress and comfortable SKIN: skin color, texture, turgor are normal, no rashes or significant lesions EYES: normal, conjunctiva are pink and non-injected, sclera clear OROPHARYNX:no exudate, no erythema and lips, buccal mucosa, and tongue normal  NECK: supple, thyroid normal size, non-tender, without nodularity LYMPH:  no palpable lymphadenopathy in the cervical, axillary or inguinal LUNGS: clear to auscultation and percussion with normal breathing effort HEART: regular rate & rhythm and no murmurs and no lower extremity edema ABDOMEN:abdomen soft, non-tender and normal bowel sounds Musculoskeletal:no cyanosis of digits and no clubbing  PSYCH: alert & oriented x 3 with fluent speech NEURO: no focal motor/sensory deficits  LABORATORY DATA:  I have reviewed the data as listed CBC Latest Ref Rng & Units 06/13/2017 06/11/2017 06/09/2017  WBC 4.0 - 10.5 K/uL 2.9(L) 3.1(L) 2.5(L)  Hemoglobin 12.0 - 15.0 g/dL 10.5(L) 10.8(L) 10.0(L)  Hematocrit 36.0 - 46.0 % 33.4(L) 35.6(L) 32.4(L)  Platelets 150 - 400 K/uL 268 329 383     Recent Labs  04/16/17 0550  04/23/17 0437  04/25/17 0441  04/28/17 0507  06/05/17 0845 06/07/17 1014 06/09/17 2249 06/12/17 1529  NA  --   < > 137  < > 138  < > 138  < > 136 136 135 138  K  --   < > 4.8  < > 4.4  < > 3.5  < > 4.7 3.4* 2.8* 6.0*  CL  --   < > 100*  < > 101  < > 96*  < > 100* 99* 97* 102  CO2  --   < > 27  < > 26  < > 30  < > 21* 21* 26 22  GLUCOSE  --   < > 125*  < > 98  < > 79  < > 100* 81 100* 106*  BUN  --   < > 39*  < > 48*  < > 38*  < > 35* 40* 32* 40*  CREATININE  --   < > 0.77  < > 1.03*  < > 1.41*  < > 3.56* 3.76* 3.85* 4.37*  CALCIUM  --   < > 8.7*  < > 8.7*  < > 7.8*  < > 8.7* 8.8* 8.3* 9.2  GFRNONAA  --   < > >60  < > 56*  < > 38*  < > 12* 12* 11* 10*  GFRAA  --   < > >60  < > >60  < > 44*  < > 14* 14* 13* 11*  PROT 4.7*  < > 6.5  --  6.3*  --  5.8*  --   --   --   --   --   ALBUMIN 2.5*  < > 4.0  4.0  < > 4.1  4.1  < > 3.4*  < > 2.9* 2.9* 2.6*  --   AST 219*  < > 83*  --  66*  --  51*  --   --   --   --   --   ALT 752*  < > 139*  --  93*  --  59*  --   --   --   --   --   ALKPHOS 608*  < > 421*  --  322*  --  235*  --   --   --   --   --   BILITOT 4.3*  < > 3.2*  --  2.9*  --  2.4*  --   --   --   --   --   BILIDIR 2.4*  --  1.5*  --  1.3*  --   --   --   --   --   --   --   IBILI 1.9*  --  1.7*  --  1.6*  --   --   --   --   --   --   --   < > = values in this interval not displayed.  RADIOGRAPHIC STUDIES: I have personally reviewed the radiological images as listed and agreed with the findings in the report. Dg Chest 2 View  Result Date: 05/30/2017 CLINICAL DATA:  CHF. EXAM: CHEST  2 VIEW COMPARISON:  Chest x-ray dated April 21, 2017. FINDINGS: Interval removal of bilateral internal jugular central venous catheters. Interval placement of a tunneled right internal jugular dialysis catheter with the tip at the cavoatrial junction. Interval removal of previously seen enteric tube. Cardiomegaly, unchanged. Mild pulmonary vascular congestion. Mild hazy opacification of the right lung base, likely a combination of  pleural effusion and atelectasis. No consolidation or pneumothorax. No acute osseous abnormality. IMPRESSION: 1. Cardiomegaly with mild pulmonary vascular congestion. 2. Small right pleural effusion with adjacent right lower lobe opacity, likely atelectasis. Electronically Signed   By: Titus Dubin M.D.   On: 05/30/2017 15:44   US Transvaginal Non-ob  Result Date: 05/17/2017 CLINICAL DATA:  Vaginal bleeding. EXAM: TRANSABDOMINAL AND TRANSVAGINAL ULTRASOUND OF PELVIS TECHNIQUE: Both transabdominal and transvaginal ultrasound examinations of the pelvis were performed. Transabdominal technique was performed for global imaging of the pelvis including uterus, ovaries, adnexal regions, and pelvic cul-de-sac. It was necessary to proceed with endovaginal exam following the transabdominal exam to visualize the endometrium. COMPARISON:  None. FINDINGS: Uterus Measurements: 7.2 x 3.7 x 5.1 cm. No fibroids. Endometrium Thickness: 22 mm, abnormal. There is a 3.7 x 2.0 x 3.4 cm hyperechoic endometrial mass and a small amount of endometrial fluid. Right ovary Not well visualized. Possible 2.7 x 1.6 x 1.4 cm cystic lesion in the right adnexa. Left ovary Not visualized.  No adnexal mass. Other findings Trace free fluid. IMPRESSION: 1. Markedly abnormal and thickened endometrium containing a focal 3.7 cm hyperechoic mass. In the setting of post-menopausal bleeding, endometrial sampling is indicated to exclude carcinoma. If results are benign, sonohysterogram should be considered for focal lesion work-up prior to hysteroscopy. (Ref: Radiological Reasoning: Algorithmic Workup of Abnormal Vaginal Bleeding with Endovaginal Sonography and Sonohysterography. AJR 2008; 952:W41-32) 2. Possible 2.7 cm cystic lesion in the right adnexa, not well seen on transvaginal images. This is almost certainly benign, but follow up ultrasound is recommended in 1 year according to the Society of Radiologists in [place]  Statement (D Clovis Riley et al. Management of Asymptomatic Ovarian and Other Adnexal Cysts Imaged at [place]: Society of Radiologists in [place] Statement 2010. Radiology 256 (Sept 2010): 440-102.). Electronically Signed   By: Titus Dubin M.D.   On: 05/17/2017 11:39   US Pelvis Complete  Result Date: 05/17/2017 CLINICAL DATA:  Vaginal bleeding. EXAM: TRANSABDOMINAL AND TRANSVAGINAL ULTRASOUND OF PELVIS TECHNIQUE: Both transabdominal and transvaginal ultrasound examinations of the pelvis were performed. Transabdominal technique was performed for global imaging of the pelvis including uterus, ovaries, adnexal regions, and pelvic cul-de-sac. It was necessary to proceed with endovaginal exam following the transabdominal exam to visualize the endometrium. COMPARISON:  None. FINDINGS: Uterus Measurements: 7.2 x 3.7 x 5.1 cm. No fibroids. Endometrium Thickness: 22 mm, abnormal. There is a 3.7 x 2.0 x 3.4 cm hyperechoic endometrial mass and a small amount of endometrial fluid. Right ovary Not well visualized. Possible 2.7 x 1.6 x 1.4 cm cystic lesion in the right adnexa. Left ovary Not visualized.  No adnexal mass. Other findings Trace free fluid. IMPRESSION: 1. Markedly abnormal and thickened endometrium containing a focal 3.7 cm hyperechoic mass. In the setting of post-menopausal bleeding, endometrial sampling is indicated to exclude carcinoma. If results are benign, sonohysterogram should be considered for focal lesion work-up prior to hysteroscopy. (Ref: Radiological Reasoning: Algorithmic Workup of Abnormal Vaginal Bleeding with Endovaginal Sonography and Sonohysterography. AJR 2008; 725:D66-44) 2. Possible 2.7 cm cystic lesion in the right adnexa, not well seen on transvaginal images. This is almost certainly benign, but follow up ultrasound is recommended in 1 year according to the Society of Radiologists in [place] Statement (D Clovis Riley et al. Management of Asymptomatic  Ovarian and Other Adnexal Cysts Imaged at [place]: Society of Radiologists in [place] Statement 2010. Radiology 256 (Sept 2010): 034-742.). Electronically Signed   By: Huntley Dec  Derry M.D.   On: 05/17/2017 11:39   Ct Abdomen Pelvis W Contrast  Result Date: 06/12/2017 CLINICAL DATA:  Initial workup for endometrial carcinoma. EXAM: CT ABDOMEN AND PELVIS WITH CONTRAST TECHNIQUE: Multidetector CT imaging of the abdomen and pelvis was performed using the standard protocol following bolus administration of intravenous contrast. CONTRAST:  75 mL Isovue 300 was injected into the existing IV in the right antecubital fossa. Examination was complicated by extravasation of contrast material into the right antecubital fossa. None of the injected contrast material into the vascular system in the examination is essentially a noncontrast examination. No IV access was available and no additional contrast-enhanced images were obtained. COMPARISON:  04/04/2017 FINDINGS: Lower chest: Small right pleural effusion. Atelectasis in both lung bases. Mild cardiac enlargement. Coronary artery calcifications. Residual contrast material in the lower esophagus without dilatation. This may indicate reflux or dysmotility. Hepatobiliary: Liver parenchyma appears homogeneous. Evaluation for metastatic disease is limited without contrast material. The gallbladder is mildly distended and multiple stones are present. Stones are present in the gallbladder neck. No gallbladder wall thickening or edema. No bile duct dilatation. Pancreas: Unremarkable. No pancreatic ductal dilatation or surrounding inflammatory changes. Spleen: Normal in size without focal abnormality. Adrenals/Urinary Tract: No adrenal gland nodules. Cyst in the midpole left kidney. No hydronephrosis in either kidney. No stones identified. Bladder wall is not thickened. Stomach/Bowel: Stomach is within normal limits. Appendix appears normal. No evidence of bowel wall  thickening, distention, or inflammatory changes. Vascular/Lymphatic: Aortic atherosclerosis. No enlarged abdominal or pelvic lymph nodes. Reproductive: Uterus and ovaries are not enlarged. 1.4 cm exophytic structure off of the uterine do not month probably represents a small fibroid. Fat planes around the uterus and cervix appear intact. No obvious endometrial thickening. Other: No abdominal wall hernia or abnormality. No abdominopelvic ascites. Musculoskeletal: Degenerative changes in the spine. No destructive or expansile bone lesions are appreciated. CONTRAST EXTRAVASATION CONSULTATION: Type of contrast:  Isovue 300 Site of extravasation: Right antecubital fossa Estimated volume of extravasation: 75 ml Area of extravasation scanned with CT? Partially included within the field of view. PATIENT'S SIGNS AND SYMPTOMS Skin blistering/ulceration: no Decrease capillary refill: no Change in skin color: No. Pre-existing bruising was present above the antecubital fossa. Decreased motor function or severe tightness: no Decreased pulses distal to site of extravasation: no Altered sensation: no Increasing pain or signs of increased swelling during observation: no. Patient complaining of no pain during or after the extravasation event. TREATMENT Observation period at site: Yes Limb elevation: yes Ice packs applied: yes Heat pads applied: None Plastic surgery consulted? no DOCUMENTATION AND FOLLOW-UP Site contrast extravasation forms submitted? yes Post extravasation orders completed? yes Was additional follow up assigned to PA's? no Patient's questions answered? yes Patient instructed to call [phone] or seek immediate medical care if symptoms progress. IMPRESSION: 1. No definite evidence of metastatic disease although lack of contrast material limits evaluation in some areas. 2. Cholelithiasis with mild gallbladder distention. Can't exclude early change of cholecystitis. 3. Aortic atherosclerosis. 4. Examination  complicated by intravenous contrast extravasation. See above. Electronically Signed   By: Lucienne Capers M.D.   On: 06/12/2017 23:54    ASSESSMENT & PLAN: 65 year old Caucasian female, with past medical history of hypertension and A. fib, was recently admitted to hospital for cellulitis and sepsis in July 2018, subsequently developed multiple medical problems, including your renal failure on dialysis, cardiac arrest, shock liver, DVT, low extremity wound, and a new uterine mass.  1. neutropenia since 06/05/2017 2. 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 ESRD on HD 4. AF 5. HTN 6. History of cardiac arrest 7. Abnormal liver function, history of shock liver 8. DVT 9. 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 Malnutrition   Recommendations: -her neutropenia is relatively new, started about one week ago, likely secondary, possible related to medications vs virus infection, although she clinically dose not have any signs of infection  -I will check folic acid, V42, and methylmalonic acid level, to ruled out magloblastic anemia  -certainly bone marrow disease is also possible, although my suspicion is not very high. Given she is clinically asymptomatic (no infection) and short duration, I recommend clinical monitoring for now. Please repeat CBC with diff 1-2 times a week.  -I will see her back in my clinic in a month. If she has persistent neutropenia, I would consider to get a bone marrow biopsy then.  -Thanks for the opportunity to participate this medically complicated patient. Please call me if you have any questions.    All questions were answered. The patient knows to call the clinic with any problems, questions or concerns.      Truitt Merle, MD 06/13/2017 9:30 PM

## 2017-06-12 NOTE — Progress Notes (Signed)
Physical Therapy Weekly Progress Note  Patient Details  Name: [name] MRN: [id number] Date of Birth: [birth year]  Beginning of progress report period: June 08, 2017 End of progress report period: June 13, 2017   Patient has met 1 of 4 short term goals.  Pt making slow progress towards functional goals.  Fluctuates between min and total A depending on fatigue and issues with memory and motor planning.  Long term goals downgraded due to slow progress.  Patient continues to demonstrate the following deficits muscle weakness, decreased motor planning, decreased problem solving, decreased memory and delayed processing and decreased sitting balance, decreased standing balance, decreased postural control and decreased balance strategies and therefore will continue to benefit from skilled PT intervention to increase functional independence with mobility.  Patient not progressing toward long term goals.  See goal revision..  Continue plan of care.  PT Short Term Goals Week 1:  PT Short Term Goal 1 (Week 1): pt will perform bed>< w/c transfers with mod assist PT Short Term Goal 1 - Progress (Week 1): Progressing toward goal PT Short Term Goal 2 (Week 1): pt will propel w/c x 150' with supervision PT Short Term Goal 2 - Progress (Week 1): Not met PT Short Term Goal 3 (Week 1): pt will sit> stand with functional hip and back extension, with assistance of 1 person PT Short Term Goal 3 - Progress (Week 1): Met PT Short Term Goal 4 (Week 1): pt will tolerate standing in standing frame or tilt table x 10 minutes PT Short Term Goal 4 - Progress (Week 1): Met Week 2:  PT Short Term Goal 1 (Week 2): = LTGs  Skilled Therapeutic Interventions/Progress Updates:  Ambulation/gait training;Balance/vestibular training;Cognitive remediation/compensation;Discharge planning;Community reintegration;DME/adaptive equipment instruction;Functional mobility training;Patient/family education;Pain  management;Neuromuscular re-education;Psychosocial support;Splinting/orthotics;Therapeutic Exercise;Stair training;Therapeutic Activities;Skin care/wound management;UE/LE Strength taining/ROM;UE/LE Coordination activities;Wheelchair propulsion/positioning     See Function Navigator for Current Functional Status.  Carry [name] 06/12/2017, 8:15 AM

## 2017-06-12 NOTE — Plan of Care (Signed)
Problem: RH Balance Goal: LTG Patient will maintain dynamic standing balance (PT) LTG:  Patient will maintain dynamic standing balance with assistance during mobility activities (PT)  Downgraded due to slow progress  Problem: RH Bed to Chair Transfers Goal: LTG Patient will perform bed/chair transfers w/assist (PT) LTG: Patient will perform bed/chair transfers with assistance, with/without cues (PT).  Downgraded due to slow progress  Problem: RH Car Transfers Goal: LTG Patient will perform car transfers with assist (PT) LTG: Patient will perform car transfers with assistance (PT).  Downgraded due to slow progress

## 2017-06-12 NOTE — Progress Notes (Signed)
Physical Therapy Note  Patient Details  Name: Sidonia Nutter MRN: [id number] Date of Birth: [birth year] Today's Date: 06/12/2017    Time: 1340-1422 42 minutes  1:1 No c/o pain.  Session focused on transfer training with sliding board.  Pt performed sliding board transfers x 5 with min/mod A.  Lateral scoots with min/mod A.  Seated ab strengthening mini crunches x 10, ball toss with trunk rotation 3 x 10.  Pt continues to require increased cues for all mobility.   Dontavious Emily 06/12/2017, 2:26 PM

## 2017-06-12 NOTE — Progress Notes (Signed)
[name] PHYSICAL MEDICINE & REHABILITATION     PROGRESS NOTE  Subjective/Complaints:  Pt seen laying in bed this AM.  She slept well overnight.  She states she would like to rest some more.    ROS: Denies CP, SOB, N/V/D.  Objective: Vital Signs: Blood pressure 96/67, pulse (!) 108, temperature 97.8 F (36.6 C), temperature source Oral, resp. rate 18, weight 63 kg (138 lb 14.2 oz), SpO2 99 %. No results found.  Recent Labs  06/09/17 2249 06/11/17 0419  WBC 2.5* 3.1*  HGB 10.0* 10.8*  HCT 32.4* 35.6*  PLT 383 329    Recent Labs  06/09/17 2249  NA 135  K 2.8*  CL 97*  GLUCOSE 100*  BUN 32*  CREATININE 3.85*  CALCIUM 8.3*   CBG (last 3)  No results for input(s): GLUCAP in the last 72 hours.  Wt Readings from Last 3 Encounters:  06/12/17 63 kg (138 lb 14.2 oz)  06/05/17 72.5 kg (159 lb 13.3 oz)  06/02/17 64.5 kg (142 lb 3.2 oz)    Physical Exam:  BP 96/67 (BP Location: Right Arm)   Pulse (!) 108   Temp 97.8 F (36.6 C) (Oral)   Resp 18   Wt 63 kg (138 lb 14.2 oz)   SpO2 99%   BMI 23.11 kg/m  Constitutional: She appears well-developed and well-nourished. No distress.  HENT: Normocephalic and atraumatic.  Eyes: EOMI. No discharge.  Cardiovascular: IRIR.No JVD Respiratory:  Resp: Effort normal and breath sounds normal.  GI: Bowel sounds are normal. She exhibits no distension. Musculoskeletal: She exhibits no edema or tenderness.  Neurological: She is alert and oriented.  Motor: B/l UE: 4+/5 proximal to distal B/l LE: HF 3-/5, KE 3/5, ADF/PF 3+/5 (unchanged) Skin: Skin is warm and dry. She is not diaphoretic. Right foot with healed abrasions. Left shin/foot +VAC.   Psychiatric: She has a normal mood and affect. Her behavior is normal. Judgment and thought content normal.   Assessment/Plan: 1. 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 Physiatrist is providing  close team supervision and 24 hour management of active medical problems listed below. Physiatrist and rehab team continue to assess barriers to discharge/monitor patient progress toward functional and medical goals.  Function:  Bathing Bathing position   Position: Wheelchair/chair at sink (UB only)  Bathing parts Body parts bathed by patient: Right arm, Left arm, Chest, Abdomen Body parts bathed by helper: Back  Bathing assist Assist Level: Set up   Set up : To obtain items  Upper Body Dressing/Undressing Upper body dressing   What is the patient wearing?: Pull over shirt/dress     Pull over shirt/dress - Perfomed by patient: Thread/unthread left sleeve, Put head through opening, Thread/unthread right sleeve, Pull shirt over trunk Pull over shirt/dress - Perfomed by helper: Pull shirt over trunk        Upper body assist Assist Level: Supervision or verbal cues   Set up : To obtain clothing/put away  Lower Body Dressing/Undressing Lower body dressing   What is the patient wearing?: Pants, Non-skid slipper socks     Pants- Performed by patient: Pull pants up/down Pants- Performed by helper: Thread/unthread right pants leg, Thread/unthread left pants leg, Pull pants up/down Non-skid slipper socks- Performed by patient: Don/doff right sock Non-skid slipper socks- Performed by helper: Don/doff left sock  Lower body assist Assist for lower body dressing:  (Total assist)      Toileting Toileting     Toileting steps completed by helper: Adjust clothing prior to toileting, Performs perineal hygiene, Adjust clothing after toileting    Toileting assist     Transfers Chair/bed transfer Chair/bed transfer activity did not occur: Safety/medical concerns (incontinent of bowel, time contraints) Chair/bed transfer method: Lateral scoot Chair/bed transfer assist level: Moderate assist (Pt 50 - 74%/lift or lower) Chair/bed transfer assistive device: Sliding  board     Locomotion Ambulation Ambulation activity did not occur: Safety/medical concerns (incontinent of bowel, time constraints)   Max distance: 5 Assist level: 2 helpers   Oceanographer activity did not occur: Safety/medical concerns (incontinent of bowel; time constraints)        Cognition Comprehension Comprehension assist level: Understands complex 90% of the time/cues 10% of the time  Expression Expression assist level: Expresses complex ideas: With extra time/assistive device  Social Interaction Social Interaction assist level: Interacts appropriately with others with medication or extra time (anti-anxiety, antidepressant).  Problem Solving Problem solving assist level: Solves basic 90% of the time/requires cueing < 10% of the time  Memory Memory assist level: Recognizes or recalls 75 - 89% of the time/requires cueing 10 - 24% of the time    Medical Problem List and Plan: 1.  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  Cont CIR 2.  DVT Prophylaxis/Anticoagulation: Pharmaceutical: Cont Eliquis  3. Pain Management: tylenol prn  4. Mood: Remains motivated. LCSW to follow for support.  5. Neuropsych: This patient is not fully capable of making decisions on her own behalf. 6. Skin/Wound Care: Maintain adequate nutritional status to help promote healing. VAC dressing changes per plastics.  7. Fluids/Electrolytes/Nutrition: Monitor I/Os. Continue megace to help with appetite. On cardiac diet to help with food choices--add 1200 cc/FR 8. A fib with RVR: To continue amiodarone at 400 mg bid and metoprolol 50 mg bid.  9. CKD, now dialysis dependent: AVG postponed for now. HD dependent--Tue/Thu/Sat schedule at end of day to help with tolerance of therapy. 10. Hypotension: On amiodarone 10 mg tid.  Relatively controlled 9/4 11. Anemia of chronic disease: IV iron for iron deficiency. On aranesp weekly.  Labs with HD  Hb 10.8 on 9/3 12. Left foot  wound: Wound VAC over Acell for healing--dressing changes weekly by plastics.  13. Anxiety/depression: Team to provide ego support.  Continue Seroquel and Zoloft.  14. Constipation: Continue miralax and linzess. Increase senna S to bid.  15. Neurtropenia  WBCs 3.1 on  9/3  Cont to monitor  Will consult Heme/Onc due to neutropenia and previous transvaginal U/S concerning for endometrial CA  Will order CT abd/pelvis due to concerns for mets to L-spine  LOS (Days) 6 A FACE TO FACE EVALUATION WAS PERFORMED  [name] 06/12/2017 8:33 AM

## 2017-06-12 NOTE — Progress Notes (Signed)
This patient has received 75 ml's of IV Isovue  300 contrast extravasation into right antecubital fossa during a CT Abdomen and Pelvis exam.  The exam was performed on (date) 06/12/2017  See the radiology report for this examination for additional details.  Site / affected area assessed by W. Leonidas Romberg, MD

## 2017-06-13 ENCOUNTER — Encounter (HOSPITAL_COMMUNITY): Payer: Medicare Other | Admitting: Psychology

## 2017-06-13 ENCOUNTER — Inpatient Hospital Stay (HOSPITAL_COMMUNITY): Payer: Medicare Other

## 2017-06-13 ENCOUNTER — Inpatient Hospital Stay (HOSPITAL_COMMUNITY): Payer: Medicare Other | Admitting: Occupational Therapy

## 2017-06-13 ENCOUNTER — Inpatient Hospital Stay (HOSPITAL_COMMUNITY): Payer: Medicare Other | Admitting: Physical Therapy

## 2017-06-13 DIAGNOSIS — F411 Generalized anxiety disorder: Secondary | ICD-10-CM

## 2017-06-13 DIAGNOSIS — L03115 Cellulitis of right lower limb: Secondary | ICD-10-CM

## 2017-06-13 DIAGNOSIS — F331 Major depressive disorder, recurrent, moderate: Secondary | ICD-10-CM

## 2017-06-13 DIAGNOSIS — D709 Neutropenia, unspecified: Secondary | ICD-10-CM

## 2017-06-13 DIAGNOSIS — D539 Nutritional anemia, unspecified: Secondary | ICD-10-CM

## 2017-06-13 DIAGNOSIS — I82409 Acute embolism and thrombosis of unspecified deep veins of unspecified lower extremity: Secondary | ICD-10-CM

## 2017-06-13 DIAGNOSIS — L03116 Cellulitis of left lower limb: Secondary | ICD-10-CM

## 2017-06-13 DIAGNOSIS — G7289 Other specified myopathies: Secondary | ICD-10-CM

## 2017-06-13 DIAGNOSIS — N189 Chronic kidney disease, unspecified: Secondary | ICD-10-CM

## 2017-06-13 DIAGNOSIS — E46 Unspecified protein-calorie malnutrition: Secondary | ICD-10-CM

## 2017-06-13 LAB — CBC WITH DIFFERENTIAL/PLATELET
BAND NEUTROPHILS: 1 %
BLASTS: 0 %
Basophils Absolute: 0 10*3/uL (ref 0.0–0.1)
Basophils Relative: 1 %
Eosinophils Absolute: 0 10*3/uL (ref 0.0–0.7)
Eosinophils Relative: 1 %
HCT: 33.4 % — ABNORMAL LOW (ref 36.0–46.0)
Hemoglobin: 10.5 g/dL — ABNORMAL LOW (ref 12.0–15.0)
LYMPHS PCT: 43 %
Lymphs Abs: 1.2 10*3/uL (ref 0.7–4.0)
MCH: 34.3 pg — AB (ref 26.0–34.0)
MCHC: 31.4 g/dL (ref 30.0–36.0)
MCV: 109.2 fL — AB (ref 78.0–100.0)
MONOS PCT: 52 %
Metamyelocytes Relative: 0 %
Monocytes Absolute: 1.6 10*3/uL — ABNORMAL HIGH (ref 0.1–1.0)
Myelocytes: 1 %
NEUTROS ABS: 0.1 10*3/uL — AB (ref 1.7–7.7)
NRBC: 0 /100{WBCs}
Neutrophils Relative %: 1 %
OTHER: 0 %
PLATELETS: 268 10*3/uL (ref 150–400)
Promyelocytes Absolute: 0 %
RBC: 3.06 MIL/uL — AB (ref 3.87–5.11)
RDW: 23.2 % — AB (ref 11.5–15.5)
WBC: 2.9 10*3/uL — AB (ref 4.0–10.5)

## 2017-06-13 LAB — PATHOLOGIST SMEAR REVIEW

## 2017-06-13 LAB — HEPATITIS B SURFACE ANTIGEN: Hepatitis B Surface Ag: NEGATIVE

## 2017-06-13 LAB — VITAMIN B12: VITAMIN B 12: 1074 pg/mL — AB (ref 180–914)

## 2017-06-13 MED ORDER — COLLAGENASE 250 UNIT/GM EX OINT
TOPICAL_OINTMENT | Freq: Every day | CUTANEOUS | Status: DC
  Administered 2017-06-13 – 2017-06-19 (×6): via TOPICAL
  Filled 2017-06-13: qty 30

## 2017-06-13 NOTE — Progress Notes (Signed)
Subjective/Chief Complaint: Back on rehab unit. VAC dressing removed from the left lower leg and the Acell has not fully incorporated. Will begin daily surgical lubricant to the area to keep Acell hydrated.  Likely for discharge next week.    Objective: Vital signs in last 24 hours: Temp:  [98.1 F (36.7 C)-98.2 F (36.8 C)] 98.1 F (36.7 C) (09/05 0420) Pulse Rate:  [41-113] 89 (09/05 1114) Resp:  [18] 18 (09/05 0420) BP: (73-99)/(21-70) 91/65 (09/05 1114) SpO2:  [99 %-100 %] 99 % (09/05 0420) Weight:  [70 kg (154 lb 5.2 oz)] 70 kg (154 lb 5.2 oz) (09/05 0420) Last BM Date: 06/12/17  Intake/Output from previous day: 09/04 0701 - 09/05 0700 In: 1508 [P.O.:1508] Out: -400  Intake/Output this shift: Total I/O In: 240 [P.O.:240] Out: -   General appearance: alert, cooperative and no distress left lower leg wounds- Acell remains in place over wound beds. No sign of infection.   Lab Results:   Recent Labs  06/11/17 0419 06/13/17 0601  WBC 3.1* 2.9*  HGB 10.8* 10.5*  HCT 35.6* 33.4*  PLT 329 268   BMET  Recent Labs  06/12/17 1529  NA 138  K 6.0*  CL 102  CO2 22  GLUCOSE 106*  BUN 40*  CREATININE 4.37*  CALCIUM 9.2   PT/INR No results for input(s): LABPROT, INR in the last 72 hours. ABG No results for input(s): PHART, HCO3 in the last 72 hours.  Invalid input(s): PCO2, PO2  Studies/Results: Ct Abdomen Pelvis W Contrast  Result Date: 06/12/2017 CLINICAL DATA:  Initial workup for endometrial carcinoma. EXAM: CT ABDOMEN AND PELVIS WITH CONTRAST TECHNIQUE: Multidetector CT imaging of the abdomen and pelvis was performed using the standard protocol following bolus administration of intravenous contrast. CONTRAST:  75 mL Isovue 300 was injected into the existing IV in the right antecubital fossa. Examination was complicated by extravasation of contrast material into the right antecubital fossa. None of the injected contrast material into the vascular system  in the examination is essentially a noncontrast examination. No IV access was available and no additional contrast-enhanced images were obtained. COMPARISON:  04/04/2017 FINDINGS: Lower chest: Small right pleural effusion. Atelectasis in both lung bases. Mild cardiac enlargement. Coronary artery calcifications. Residual contrast material in the lower esophagus without dilatation. This may indicate reflux or dysmotility. Hepatobiliary: Liver parenchyma appears homogeneous. Evaluation for metastatic disease is limited without contrast material. The gallbladder is mildly distended and multiple stones are present. Stones are present in the gallbladder neck. No gallbladder wall thickening or edema. No bile duct dilatation. Pancreas: Unremarkable. No pancreatic ductal dilatation or surrounding inflammatory changes. Spleen: Normal in size without focal abnormality. Adrenals/Urinary Tract: No adrenal gland nodules. Cyst in the midpole left kidney. No hydronephrosis in either kidney. No stones identified. Bladder wall is not thickened. Stomach/Bowel: Stomach is within normal limits. Appendix appears normal. No evidence of bowel wall thickening, distention, or inflammatory changes. Vascular/Lymphatic: Aortic atherosclerosis. No enlarged abdominal or pelvic lymph nodes. Reproductive: Uterus and ovaries are not enlarged. 1.4 cm exophytic structure off of the uterine do not month probably represents a small fibroid. Fat planes around the uterus and cervix appear intact. No obvious endometrial thickening. Other: No abdominal wall hernia or abnormality. No abdominopelvic ascites. Musculoskeletal: Degenerative changes in the spine. No destructive or expansile bone lesions are appreciated. CONTRAST EXTRAVASATION CONSULTATION: Type of contrast:  Isovue 300 Site of extravasation: Right antecubital fossa Estimated volume of extravasation: 75 ml Area of extravasation scanned with CT? Partially included  within the field of view.  PATIENT'S SIGNS AND SYMPTOMS Skin blistering/ulceration: no Decrease capillary refill: no Change in skin color: No. Pre-existing bruising was present above the antecubital fossa. Decreased motor function or severe tightness: no Decreased pulses distal to site of extravasation: no Altered sensation: no Increasing pain or signs of increased swelling during observation: no. Patient complaining of no pain during or after the extravasation event. TREATMENT Observation period at site: Yes Limb elevation: yes Ice packs applied: yes Heat pads applied: None Plastic surgery consulted? no DOCUMENTATION AND FOLLOW-UP Site contrast extravasation forms submitted? yes Post extravasation orders completed? yes Was additional follow up assigned to PA's? no Patient's questions answered? yes Patient instructed to call [phone] or seek immediate medical care if symptoms progress. IMPRESSION: 1. No definite evidence of metastatic disease although lack of contrast material limits evaluation in some areas. 2. Cholelithiasis with mild gallbladder distention. Can't exclude early change of cholecystitis. 3. Aortic atherosclerosis. 4. Examination complicated by intravenous contrast extravasation. See above. Electronically Signed   By: Lucienne Capers M.D.   On: 06/12/2017 23:54    Anti-infectives: Anti-infectives    None      Assessment/Plan: s/p * No surgery found * Will discuss plan with Dr. [name].   Does not seem ready for skin graft at this point.  On Eliquis now.  Will plan for follow up as outpatient.   LOS: 7 days    RAYBURN,SHAWN,PA-C Plastic Surgery [phone]

## 2017-06-13 NOTE — Progress Notes (Signed)
Spoke to patients RN, no increased swelling or redness. She has been elevating arm and placing ice packs on site. She will contact radiology with any questions.

## 2017-06-13 NOTE — Consult Note (Addendum)
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 Wound bed: 100% covered in slough, tan, no drainage or odor Drainage (amount, consistency, odor) none Periwound:red, IAD Dressing procedure/placement/frequency: I have spoken with Algis Liming, PA and she states the patient will not be here much longer and wants to do hydrotherapy and Santyl so it will speed up the process. Will place orders for this and for bedside nurse to perform on Sunday when hydrotherapy not available. We will not follow, but will remain available to this patient, to nursing, and the medical and/or surgical teams.  Please re-consult if we need to assist further.   Fara Olden, RN-C, WTA-C Wound Treatment Associate

## 2017-06-13 NOTE — Progress Notes (Signed)
Physical Therapy Session Note  Patient Details  Name: [name] MRN: [id number] Date of Birth: Aug 07, 1951  Today's Date: 06/13/2017 PT Individual Time: 1335-1400 PT Individual Time Calculation (min): 25 min   Short Term Goals: Week 2:  PT Short Term Goal 1 (Week 2): = LTGs  Skilled Therapeutic Interventions/Progress Updates:   Pt received sitting in WC and agreeable to PT  WC mobility x 136f with min assist from PT for increased use of the LUE to maintain straight trajectory.   Pregait training for sit<>stand x 2 with max-total assist from PT. Max cues from PT for set up and positioning. In standing, reciprocal stepping x 3 BLE in parallel bars. Moderate assist to upright posture and weight shifting for stepping task.   [facility] and all needs met.         Therapy Documentation Precautions:  Precautions Precautions: Fall Precaution Comments: R IJ perm cath; wound vac LLE; LUE DVT Restrictions Weight Bearing Restrictions: No Vital Signs: Therapy Vitals Pulse Rate: 89 BP: 91/65 Patient Position (if appropriate): Sitting Pain:   0/10 at rest  See Function Navigator for Current Functional Status.   Therapy/Group: Individual Therapy  ALorie Phenix9/02/2017, 2:06 PM

## 2017-06-13 NOTE — Progress Notes (Signed)
Social Work Patient ID: [name], female   DOB: 02-01-1951, 66 y.o.   MRN: [id number]  Spoke with husband via telephone to discuss team conference goals-mod assist and target discharge 9/11. Husband can start coming in tomorrow to learn pt's care. Discussed goals have been down graded to mod assist level. She may be a lot of care for husband. He will be here @ 9:30 for PT session. Asked for a grounds pass for pt. Will see husband tomorrow.

## 2017-06-13 NOTE — Progress Notes (Addendum)
Occupational Therapy Session Note  Patient Details  Name: [name] MRN: [id number] Date of Birth: 11-01-1950  Today's Date: 06/13/2017 OT Individual Time: 0906-1005 OT Individual Time Calculation (min): 59 min    Short Term Goals: Week 1:  OT Short Term Goal 1 (Week 1): Pt will complete lateral leans with min assist and mod cues to decrease burden of care with toileting hygiene OT Short Term Goal 2 (Week 1): Pt will complete LB dressing at bed level with min assist OT Short Term Goal 3 (Week 1): Pt will complete slide board transfer to drop arm BSC with min assist  Skilled Therapeutic Interventions/Progress Updates:    Pt seen to focus on bed mobility and carryover, trunk/abdominal strength, and self-care tasks/ADLs. Pt received supine in bed with no c/o of pain. Pt completed supine<>left side and right side x8 with and without use of bedrails with instructional cues and min. A to roll to improve bed mobility and carryover. Trunk twists in supine x10 to promote trunk/abdominal strengthening to improve bed mobility to lighten burden of care. Therapist provided min-mod A to bend BLE during exercises. Hip bridges x10 with pt education of benefit. Pt performed LB dressing supine requiring light mod. A for LE placement and rest breaks due to fatigue and possible frustration. Pt attempted slide board transfer bed > w/c however therapist completed transfer for pt safety due to board sliding off air mattress. Therapist provided assist for UB dressing due to time constraint. Pt able to complete grooming task with set-up. Pt left in w/c with all needs in reach.  Therapy Documentation Precautions:  Precautions Precautions: Fall Precaution Comments: R IJ perm cath; wound vac LLE; LUE DVT Restrictions Weight Bearing Restrictions: No    Vital Signs: Therapy Vitals Pulse Rate: 89 BP: 91/65 Patient Position (if appropriate): Sitting  See Function Navigator for Current Functional  Status.   Therapy/Group: Individual Therapy  Hoyt Koch 06/13/2017, 12:14 PM    I have read and attest to the accuracy of this treatment session and documentation. Meriel Pica, OTR/L 06/13/2017  12:35 PM

## 2017-06-13 NOTE — Progress Notes (Signed)
Occupational Therapy Session Note  Patient Details  Name: [name] MRN: [id number] Date of Birth: 1951/02/25  Today's Date: 06/13/2017 OT Individual Time: 6759-1638 OT Individual Time Calculation (min): 43 min    Short Term Goals: Week 1:  OT Short Term Goal 1 (Week 1): Pt will complete lateral leans with min assist and mod cues to decrease burden of care with toileting hygiene OT Short Term Goal 2 (Week 1): Pt will complete LB dressing at bed level with min assist OT Short Term Goal 3 (Week 1): Pt will complete slide board transfer to drop arm BSC with min assist  Skilled Therapeutic Interventions/Progress Updates:    Treatment session focused on functional transfers and functional mobility to improve independence and lessen burden of care of self-care tasks/ADLs. Pt in bed upon arrival with no c/o pain. Supine <> EOB requiring min. A for sequencing and LE management. Bed > w/c sliding board transfer with min. A and verbal cues for technique. Pt participated in therapeutic activity to focus on lateral leaning to left and right for improvement in clothing management. Sitting on therapy mat, pt completed lateral leaning to left and right x15 on each side with mod instructional cues and close supervision for pt safety. Pt required rest breaks between each side. Slide board transfer therapy mat <> w/c with min. A and verbal cues. Pt requested to get back in bed at end of session. Therapist provided mod. A for slide board transfer w/c > bed fading to min. A due to pt fatigue. Pt left with all needs in reach.  Therapy Documentation Precautions:  Precautions Precautions: Fall Precaution Comments: R IJ perm cath; wound vac LLE; LUE DVT Restrictions Weight Bearing Restrictions: No General:   Vital Signs: Therapy Vitals Temp: 98 F (36.7 C) Temp Source: Oral Pulse Rate: 97 Resp: 19 BP: 102/79 Patient Position (if appropriate): Lying Oxygen Therapy SpO2: 100 % O2 Device: Not  Delivered  See Function Navigator for Current Functional Status.   Therapy/Group: Individual Therapy  Hoyt Koch 06/13/2017, 4:15 PM

## 2017-06-13 NOTE — Progress Notes (Addendum)
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 Now has dry adherent yellow eschar--likely needs santyl v/s mechanical debridement--will consult WOC for input.

## 2017-06-13 NOTE — Consult Note (Signed)
Neuropsychological consultation  Patient:  [name]   DOB: 12-Jun-1951  MR Number: [id number]  Start: 10 AM End: 11 AM  Provider/Observer:     Ilean Skill, Psy.D.   Reason For Service:     Khya Halls is a 66 year old female admitted 04/08/2017 due to BLE cellulitis with difficulty walking.  She was found to be septic and hypotensive.  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 Patient was readmitted to CIR on 05/08/2017 after treatment for BLE ulcers.    Interventions Strategy:  Cognitive/behavioral therapeutic interventions and working on coping skills and strategies around anxiety and depressive type symptoms.  There were some concerns about memory and cognitive issues and these were also addressed today.  Participation Level:   Active  Participation Quality:  Appropriate      Behavioral Observation:  Well Groomed, Alert, and Appropriate.   Current Psychosocial Factors: The patient continues to describe issues and concerns about how her husband will be able to take care of her and if she will be a burden.    Content of Session:   Reviewed current symptoms as well as conducted a mental status exam. Also worked on Therapist, occupational and strategies around issues related to anxiety and worry and assessed current symptoms of depression.  Current Status: The patient reports anxiety more about thinking about and worrying about discharge issues. The patient reports that she has had some days with a lot of frustration.  Impression/Diagnosis:   [name] is a 66 year old female admitted 04/08/2017 due to BLE cellulitis with difficulty walking.  She was found to be septic and hypotensive.  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  The patient reports that she is coping fairly well given the medical situation.  She agreed to keep  staff informed if depressive or anxiety type symptoms develop or worsen.  The patient acknowledges that she has had times with anxiety and frustration along with depressive events. Her anxiety is more about what it will be like when she is discharged and will she be ready. The patient reports that her mood is much better today and she is not feeling overly depressed. She was instructed to keep [place] informed if she does develop more a recurrent depressive type symptoms. We worked specifically today around issues of anxiety with regard to her medical treatment and upcoming discharge. Her discharge date has not been determined at this point.  Memory and cognitive functioning appear to be improving.  She did well with Mental status Exam today.    Diagnosis:

## 2017-06-13 NOTE — Progress Notes (Signed)
Physical Therapy Note  Patient Details  Name: [name] MRN: [id number] Date of Birth: 02-03-1951 Today's Date: 06/13/2017  0800-0900, 60 min individual tx Pain: none per pt  Supine> sit with HOB, mod assist.  Pt sat EOB with feet support, to eat breakfast.  VCs for upright posture and to avoid posterior pelvic tilt with UE activity. Pt scooting forward/backward and laterally with mod/max assist.  R/L lateral leans for trunk/UE strengthening; mod assist needed to achieve upright from L lateral lean, stand by assist for R lateral leans..  Pt noted to be soiled.  Bed moibility in flat bed with rails for rolling for hygiene change.  R sacral skin area noted; PT requested RN assess before putting diaper back on. Supine.therex: 10 x 2 lower trunk rotation, bridging; L side lying exs: 10 x 1 R hip and knee flex/ext active assistive, and R clam shells for R hip abduction.  Pt left resting in bed in L sidelying with all needs within reach.  See function navigator for current status.    See function navigator for current status.  [name] 06/13/2017, 7:50 AM

## 2017-06-13 NOTE — Patient Care Conference (Signed)
Inpatient RehabilitationTeam Conference and Plan of Care Update Date: 06/13/2017   Time: 2:30 PM    Patient Name: [name]      Medical Record Number: [id number]  Date of Birth: 12-24-1950 Sex: Female         Room/Bed: 4W07C/4W07C-01 Payor Info: Payor: [employer] MEDICARE / Plan: [employer] MEDICARE / Product Type: *No Product type* /    Admitting Diagnosis: Cervical at MYO  Admit Date/Time:  06/06/2017  5:51 PM Admission Comments: No comment available   Primary Diagnosis:  <principal problem not specified> Principal Problem: <principal problem not specified>  Patient Active Problem List   Diagnosis Date Noted  . Leukopenia   . Slow transit constipation   . Acute blood loss anemia   . Splinter of foot without major open wound or infection   . Dependence on renal dialysis ([place])   . Chronic kidney disease (CKD), stage IV (severe) ([place])   . Atrial fibrillation with rapid ventricular response ([place])   . Chronic anticoagulation   . Pressure injury of skin 06/04/2017  . Chronic systolic CHF (congestive heart failure) ([place]) 06/03/2017  . Atrial fibrillation with RVR ([place]) 06/02/2017  . ESRD (end stage renal disease) ([place])   . Vaginal discharge   . AKI (acute kidney injury) ([place])   . Bleeding   . A-fib ([place])   . Abnormal urinalysis   . Dependence on hemodialysis ([place])   . Ulcer of left lower extremity with fat layer exposed ([place])   . Skin ulcer of left foot with fat layer exposed ([place])   . Vaginal bleeding   . Hypoglycemia associated with diabetes ([place])   . Skin ulcer of right heel ([place])   . PAF (paroxysmal atrial fibrillation) ([place])   . Hypotension   . Leukocytosis   . Anemia of chronic disease   . Anxiety state   . Depression   . Prediabetes   . Stage 5 chronic kidney disease not on chronic dialysis ([place])   . 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 Pressure sore on buttocks 04/19/2017  . Acute renal failure (ARF) ([place])   . Cellulitis of lower extremity   . Edema  extremities   . Acute respiratory failure ([place])   . Acute renal failure ([place])   . Multi-organ failure with heart failure ([place])   . Palliative care by specialist   . Goals of care, counseling/discussion   . Septic shock ([place]) 04/08/2017    Expected Discharge Date: Expected Discharge Date: 06/19/17  Team Members Present: Physician leading conference: Dr. Delice Lesch Social Worker Present: Ovidio Kin, LCSW Nurse Present: Other (comment) Dimitri Ped) PT Present: Georjean Mode, PT OT Present: [name], OT PPS Coordinator present : Daiva Nakayama, RN, CRRN     Current Status/Progress Goal Weekly Team Focus  Medical   LE>UE weakness, difficulty with mobility and transfers secondary to CIM  Improve endurance, labs, cognition  See above   Bowel/Bladder   continent/ incontinent,loose stool last BM 06/13/2017  stay continent  toileting more often   Swallow/Nutrition/ Hydration             ADL's   mod assist transfers with slide board, min assist LB bathing and dressing at bed level, +2 for any functional standing.  Working on lateral leans to decrease burden of care with toileting  min assist overall, mod assist toileting  ADL retraining, recall of education from previous session, transfers, BUE strengthening, lateral leans   Mobility   mod/max A transfers, can be total if  fatigued  downgraded to mod A transfers, max A car transfer  family ed, d/c planning, strength, endurance   Communication             Safety/Cognition/ Behavioral Observations            Pain   no complains of pain  pain free  monitor and adress it as need it    Skin   wound vac on left leg was d/c today,sdaily dressing for now, hydrotherapy for her  pressure injury  obn her sacrum  monitor and change the dressings  monitor and adress the wounds      *See Care Plan and progress notes for long and short-term goals.     Barriers to Discharge  Current Status/Progress Possible Resolutions Date Resolved    Physician    Medical stability;Wound Care     See above  VAC changes per PRS, Recs per Nephro/Heme?onc, follow labs, therapies      Nursing                  PT                    OT                  SLP                SW                Discharge Planning/Teaching Needs:  HOme with husband will need to come in for education since downgraded goals-mod-max level      Team Discussion:  Downgraded goals to mod level with car-max level. Would vac discharged today. Hydrotherapy starting tomorrow on her sacral wound. Poor po intake-trying to do better. Fatigues easily. CT scan yesterday IV infiltrated and arm is swollen. Will need husband in for multiple days of family education prior to discharge  Revisions to Treatment Plan:  DC 9/11    Continued Need for Acute Rehabilitation Level of Care: The patient requires daily medical management by a physician with specialized training in physical medicine and rehabilitation for the following conditions: Daily direction of a multidisciplinary physical rehabilitation program to ensure safe treatment while eliciting the highest outcome that is of practical value to the patient.: Yes Daily medical management of patient stability for increased activity during participation in an intensive rehabilitation regime.: Yes Daily analysis of laboratory values and/or radiology reports with any subsequent need for medication adjustment of medical intervention for : Post surgical problems;Wound care problems;Renal problems;Neurological problems;Mood/behavior problems  [name] 06/14/2017, 8:29 AM

## 2017-06-13 NOTE — Progress Notes (Signed)
Recreational Therapy Session Note  Patient Details  Name: [name] MRN: [id number] Date of Birth: [birth year] Today's Date: 06/13/2017  TR eval deferred as pt has low activity tolerance and needs to focus on family education in preparation for discharge 9/11.  Will continue to monitor through team. [place] 06/13/2017, 4:21 PM

## 2017-06-13 NOTE — Progress Notes (Signed)
[place] PHYSICAL MEDICINE & REHABILITATION     PROGRESS NOTE  Subjective/Complaints:  Pt seen laying in bed this AM.  She slept well overnight.  Denies complaints.   ROS: Denies CP, SOB, N/V/D.  Objective: Vital Signs: Blood pressure 94/70, pulse (!) 41, temperature 98.1 F (36.7 C), temperature source Oral, resp. rate 18, weight 70 kg (154 lb 5.2 oz), SpO2 99 %. Ct Abdomen Pelvis W Contrast  Result Date: 06/12/2017 CLINICAL DATA:  Initial workup for endometrial carcinoma. EXAM: CT ABDOMEN AND PELVIS WITH CONTRAST TECHNIQUE: Multidetector CT imaging of the abdomen and pelvis was performed using the standard protocol following bolus administration of intravenous contrast. CONTRAST:  75 mL Isovue 300 was injected into the existing IV in the right antecubital fossa. Examination was complicated by extravasation of contrast material into the right antecubital fossa. None of the injected contrast material into the vascular system in the examination is essentially a noncontrast examination. No IV access was available and no additional contrast-enhanced images were obtained. COMPARISON:  04/04/2017 FINDINGS: Lower chest: Small right pleural effusion. Atelectasis in both lung bases. Mild cardiac enlargement. Coronary artery calcifications. Residual contrast material in the lower esophagus without dilatation. This may indicate reflux or dysmotility. Hepatobiliary: Liver parenchyma appears homogeneous. Evaluation for metastatic disease is limited without contrast material. The gallbladder is mildly distended and multiple stones are present. Stones are present in the gallbladder neck. No gallbladder wall thickening or edema. No bile duct dilatation. Pancreas: Unremarkable. No pancreatic ductal dilatation or surrounding inflammatory changes. Spleen: Normal in size without focal abnormality. Adrenals/Urinary Tract: No adrenal gland nodules. Cyst in the midpole left kidney. No hydronephrosis in either kidney. No  stones identified. Bladder wall is not thickened. Stomach/Bowel: Stomach is within normal limits. Appendix appears normal. No evidence of bowel wall thickening, distention, or inflammatory changes. Vascular/Lymphatic: Aortic atherosclerosis. No enlarged abdominal or pelvic lymph nodes. Reproductive: Uterus and ovaries are not enlarged. 1.4 cm exophytic structure off of the uterine do not month probably represents a small fibroid. Fat planes around the uterus and cervix appear intact. No obvious endometrial thickening. Other: No abdominal wall hernia or abnormality. No abdominopelvic ascites. Musculoskeletal: Degenerative changes in the spine. No destructive or expansile bone lesions are appreciated. CONTRAST EXTRAVASATION CONSULTATION: Type of contrast:  Isovue 300 Site of extravasation: Right antecubital fossa Estimated volume of extravasation: 75 ml Area of extravasation scanned with CT? Partially included within the field of view. PATIENT'S SIGNS AND SYMPTOMS Skin blistering/ulceration: no Decrease capillary refill: no Change in skin color: No. Pre-existing bruising was present above the antecubital fossa. Decreased motor function or severe tightness: no Decreased pulses distal to site of extravasation: no Altered sensation: no Increasing pain or signs of increased swelling during observation: no. Patient complaining of no pain during or after the extravasation event. TREATMENT Observation period at site: Yes Limb elevation: yes Ice packs applied: yes Heat pads applied: None Plastic surgery consulted? no DOCUMENTATION AND FOLLOW-UP Site contrast extravasation forms submitted? yes Post extravasation orders completed? yes Was additional follow up assigned to PA's? no Patient's questions answered? yes Patient instructed to call [phone] or seek immediate medical care if symptoms progress. IMPRESSION: 1. No definite evidence of metastatic disease although lack of contrast material limits evaluation in some areas.  2. Cholelithiasis with mild gallbladder distention. Can't exclude early change of cholecystitis. 3. Aortic atherosclerosis. 4. Examination complicated by intravenous contrast extravasation. See above. Electronically Signed   By: Lucienne Capers M.D.   On: 06/12/2017 23:54    Recent  Labs  06/11/17 0419 06/13/17 0601  WBC 3.1* 2.9*  HGB 10.8* 10.5*  HCT 35.6* 33.4*  PLT 329 268    Recent Labs  06/12/17 1529  NA 138  K 6.0*  CL 102  GLUCOSE 106*  BUN 40*  CREATININE 4.37*  CALCIUM 9.2   CBG (last 3)  No results for input(s): GLUCAP in the last 72 hours.  Wt Readings from Last 3 Encounters:  06/13/17 70 kg (154 lb 5.2 oz)  06/05/17 72.5 kg (159 lb 13.3 oz)  06/02/17 64.5 kg (142 lb 3.2 oz)    Physical Exam:  BP 94/70 (BP Location: Right Arm)   Pulse (!) 41   Temp 98.1 F (36.7 C) (Oral)   Resp 18   Wt 70 kg (154 lb 5.2 oz)   SpO2 99%   BMI 25.68 kg/m  Constitutional: She appears well-developed and well-nourished. No distress.  HENT: Normocephalic and atraumatic.  Eyes: EOMI. No discharge.  Cardiovascular: IRIR.No JVD Respiratory:  Resp: Effort normal and breath sounds normal.  GI: Bowel sounds are normal. She exhibits no distension. Musculoskeletal: She exhibits no edema or tenderness.  Neurological: She is alert and oriented.  Motor: B/l UE: 4+/5 proximal to distal B/l LE: HF 3-/5, KE 3/5, ADF/PF 3+/5 (stable) Skin: Skin is warm and dry. She is not diaphoretic. Right foot with healed abrasions. Left shin/foot +VAC.   Psychiatric: She has a normal mood and affect. Her behavior is normal. Judgment and thought content normal.   Assessment/Plan: 1. 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 Physiatrist is providing close team supervision and 24 hour management of active medical problems listed below. Physiatrist and rehab team continue to assess barriers to  discharge/monitor patient progress toward functional and medical goals.  Function:  Bathing Bathing position   Position: Bed (LB at bed level; UB in w/c at sink)  Bathing parts Body parts bathed by patient: Right arm, Left arm, Chest, Abdomen, Front perineal area, Right upper leg, Left upper leg, Right lower leg Body parts bathed by helper: Back, Buttocks  Bathing assist Assist Level: Touching or steadying assistance(Pt > 75%)   Set up : To obtain items  Upper Body Dressing/Undressing Upper body dressing   What is the patient wearing?: Pull over shirt/dress     Pull over shirt/dress - Perfomed by patient: Thread/unthread right sleeve, Thread/unthread left sleeve, Put head through opening, Pull shirt over trunk Pull over shirt/dress - Perfomed by helper: Pull shirt over trunk        Upper body assist Assist Level: Supervision or verbal cues   Set up : To obtain clothing/put away  Lower Body Dressing/Undressing Lower body dressing   What is the patient wearing?: Pants, Non-skid slipper socks     Pants- Performed by patient: Thread/unthread right pants leg, Pull pants up/down Pants- Performed by helper: Thread/unthread left pants leg Non-skid slipper socks- Performed by patient: Don/doff right sock Non-skid slipper socks- Performed by helper: Don/doff left sock                  Lower body assist Assist for lower body dressing: Touching or steadying assistance (Pt > 75%)      Toileting Toileting     Toileting steps completed by helper: Adjust clothing prior to toileting, Performs perineal hygiene, Adjust clothing after toileting    Toileting assist     Transfers Chair/bed transfer Chair/bed transfer activity did not occur: Safety/medical  concerns (incontinent of bowel, time contraints) Chair/bed transfer method: Lateral scoot Chair/bed transfer assist level: Moderate assist (Pt 50 - 74%/lift or lower) Chair/bed transfer assistive device: Sliding board      Locomotion Ambulation Ambulation activity did not occur: Safety/medical concerns (incontinent of bowel, time constraints)   Max distance: 5 Assist level: 2 helpers   Oceanographer activity did not occur: Safety/medical concerns (incontinent of bowel; time constraints)     Assist Level: Dependent (Pt equals 0%)  Cognition Comprehension Comprehension assist level: Understands complex 90% of the time/cues 10% of the time  Expression Expression assist level: Expresses complex 90% of the time/cues < 10% of the time  Social Interaction Social Interaction assist level: Interacts appropriately with others with medication or extra time (anti-anxiety, antidepressant). (zoloft)  Problem Solving Problem solving assist level: Solves basic 90% of the time/requires cueing < 10% of the time  Memory Memory assist level: Recognizes or recalls 75 - 89% of the time/requires cueing 10 - 24% of the time    Medical Problem List and Plan: 1.  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  Cont CIR 2.  DVT Prophylaxis/Anticoagulation: Pharmaceutical: Cont Eliquis  3. Pain Management: tylenol prn  4. Mood: Remains motivated. LCSW to follow for support.  5. Neuropsych: This patient is not fully capable of making decisions on her own behalf. 6. Skin/Wound Care: Maintain adequate nutritional status to help promote healing. VAC dressing changes per plastics.  7. Fluids/Electrolytes/Nutrition: Monitor I/Os. Continue megace to help with appetite. On cardiac diet to help with food choices--add 1200 cc/FR 8. A fib with RVR: To continue amiodarone at 400 mg bid and metoprolol 50 mg bid.  9. CKD, now dialysis dependent: AVG postponed for now. HD dependent--Tue/Thu/Sat schedule at end of day to help with tolerance of therapy. 10. Hypotension: On amiodarone 10 mg tid.  Relatively controlled 9/5 11. Anemia of chronic disease: IV iron for iron deficiency. On aranesp weekly.  Labs with  HD  Hb 10.5 on 9/5 12. Left foot wound: Wound VAC over Acell for healing--dressing changes weekly by plastics.  13. Anxiety/depression: Team to provide ego support.  Continue Seroquel and Zoloft.  14. Constipation: Continue miralax and linzess. Increase senna S to bid.  15. Neurtropenia  WBCs 2.9 on  9/5  Cont to monitor  Will consult Heme/Onc due to neutropenia and previous transvaginal U/S concerning for endometrial CA  CT reviewed, limited, but relatively unremarkable for abdominal pathology  Await final Heme/Onc recs  LOS (Days) 7 A FACE TO FACE EVALUATION WAS PERFORMED  Ankit Lorie Phenix 06/13/2017 8:06 AM

## 2017-06-14 ENCOUNTER — Encounter: Payer: Medicare Other | Admitting: Obstetrics & Gynecology

## 2017-06-14 ENCOUNTER — Inpatient Hospital Stay (HOSPITAL_COMMUNITY): Payer: Medicare Other | Admitting: Physical Therapy

## 2017-06-14 ENCOUNTER — Inpatient Hospital Stay (HOSPITAL_COMMUNITY): Payer: Medicare Other | Admitting: Occupational Therapy

## 2017-06-14 LAB — RENAL FUNCTION PANEL
Albumin: 2.7 g/dL — ABNORMAL LOW (ref 3.5–5.0)
Anion gap: 13 (ref 5–15)
BUN: 29 mg/dL — ABNORMAL HIGH (ref 6–20)
CALCIUM: 8.7 mg/dL — AB (ref 8.9–10.3)
CO2: 23 mmol/L (ref 22–32)
CREATININE: 3.69 mg/dL — AB (ref 0.44–1.00)
Chloride: 96 mmol/L — ABNORMAL LOW (ref 101–111)
GFR calc Af Amer: 14 mL/min — ABNORMAL LOW (ref >60)
GFR calc non Af Amer: 12 mL/min — ABNORMAL LOW (ref >60)
GLUCOSE: 78 mg/dL (ref 65–99)
Phosphorus: 2.8 mg/dL (ref 2.5–4.6)
Potassium: 4.7 mmol/L (ref 3.5–5.1)
SODIUM: 132 mmol/L — AB (ref 135–145)

## 2017-06-14 LAB — CBC
HCT: 33.7 % — ABNORMAL LOW (ref 36.0–46.0)
HEMOGLOBIN: 10.1 g/dL — AB (ref 12.0–15.0)
MCH: 32.9 pg (ref 26.0–34.0)
MCHC: 30 g/dL (ref 30.0–36.0)
MCV: 109.8 fL — AB (ref 78.0–100.0)
Platelets: 336 10*3/uL (ref 150–400)
RBC: 3.07 MIL/uL — ABNORMAL LOW (ref 3.87–5.11)
RDW: 22.5 % — ABNORMAL HIGH (ref 11.5–15.5)
WBC: 5 10*3/uL (ref 4.0–10.5)

## 2017-06-14 LAB — FOLATE RBC
Folate, RBC: >1879 ng/mL (ref >498)
HEMATOCRIT: 33 % — AB (ref 34.0–46.6)

## 2017-06-14 MED ORDER — SODIUM CHLORIDE 0.9 % IV SOLN: 100.0000 mL | INTRAVENOUS | Status: DC | PRN

## 2017-06-14 MED ORDER — MIDODRINE HCL 5 MG PO TABS
ORAL_TABLET | ORAL | Status: AC
  Administered 2017-06-14: 10 mg via ORAL
  Filled 2017-06-14: qty 2

## 2017-06-14 MED ORDER — ALTEPLASE 2 MG IJ SOLR: 2.0000 mg | Freq: Once | INTRAMUSCULAR | Status: DC | PRN

## 2017-06-14 MED ORDER — HEPARIN SODIUM (PORCINE) 1000 UNIT/ML DIALYSIS: 20.0000 [IU]/kg | INTRAMUSCULAR | Status: DC | PRN

## 2017-06-14 MED ORDER — LIDOCAINE HCL (PF) 1 % IJ SOLN: 5.0000 mL | INTRAMUSCULAR | Status: DC | PRN

## 2017-06-14 MED ORDER — NEPRO/CARBSTEADY PO LIQD
237.0000 mL | Freq: Two times a day (BID) | ORAL | Status: DC
  Administered 2017-06-15 – 2017-06-18 (×4): 237 mL via ORAL

## 2017-06-14 MED ORDER — LIDOCAINE-PRILOCAINE 2.5-2.5 % EX CREA: 1.0000 "application " | TOPICAL_CREAM | CUTANEOUS | Status: DC | PRN

## 2017-06-14 MED ORDER — DARBEPOETIN ALFA 60 MCG/0.3ML IJ SOSY
PREFILLED_SYRINGE | INTRAMUSCULAR | Status: AC
  Administered 2017-06-14: 60 ug via INTRAVENOUS
  Filled 2017-06-14: qty 0.3

## 2017-06-14 MED ORDER — PENTAFLUOROPROP-TETRAFLUOROETH EX AERO: 1.0000 "application " | INHALATION_SPRAY | CUTANEOUS | Status: DC | PRN

## 2017-06-14 MED ORDER — HEPARIN SODIUM (PORCINE) 1000 UNIT/ML DIALYSIS: 1000.0000 [IU] | INTRAMUSCULAR | Status: DC | PRN

## 2017-06-14 NOTE — Progress Notes (Signed)
Physical Therapy Wound Evaluation/Treatment Patient Details  Name: [name] MRN: [id number] Date of Birth: 1950-12-26  Today's Date: 06/14/2017 PT Individual Time:  0830-0910     Patient Active Problem List   Diagnosis Date Noted  . Leukopenia   . Slow transit constipation   . Acute blood loss anemia   . Splinter of foot without major open wound or infection   . Dependence on renal dialysis ([place])   . Chronic kidney disease (CKD), stage IV (severe) ([place])   . Atrial fibrillation with rapid ventricular response ([place])   . Chronic anticoagulation   . Pressure injury of skin 06/04/2017  . Chronic systolic CHF (congestive heart failure) ([place]) 06/03/2017  . Atrial fibrillation with RVR ([place]) 06/02/2017  . ESRD (end stage renal disease) ([place])   . Vaginal discharge   . AKI (acute kidney injury) ([place])   . Bleeding   . A-fib ([place])   . Abnormal urinalysis   . Dependence on hemodialysis ([place])   . Ulcer of left lower extremity with fat layer exposed ([place])   . Skin ulcer of left foot with fat layer exposed ([place])   . Vaginal bleeding   . Hypoglycemia associated with diabetes ([place])   . Skin ulcer of right heel ([place])   . PAF (paroxysmal atrial fibrillation) ([place])   . Hypotension   . Leukocytosis   . Anemia of chronic disease   . Anxiety state   . Depression   . Prediabetes   . Stage 5 chronic kidney disease not on chronic dialysis ([place])   . 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 Pressure sore on buttocks 04/19/2017  . Acute renal failure (ARF) ([place])   . Cellulitis of lower extremity   . Edema extremities   . Acute respiratory failure ([place])   . Acute renal failure ([place])   . Multi-organ failure with heart failure ([place])   . Palliative care by specialist   . Goals of care, counseling/discussion   . Septic shock ([place]) 04/08/2017   Past Medical History:  Diagnosis Date  . A-fib ([place])    on pradaxa  . Chronic kidney disease   . Dysrhythmia   . Hypertension   . Hypothyroidism   .  Thyroid disease    Past Surgical History:  Procedure Laterality Date  . APPLICATION OF A-CELL OF EXTREMITY Left 05/28/2017   Procedure: APPLICATION OF A-CELL OF LEFT LOWER EXTREMITY;  Surgeon: Wallace Going, DO;  Location: [facility];  Service: Plastics;  Laterality: Left;  . APPLICATION OF WOUND VAC Left 05/28/2017   Procedure: APPLICATION OF WOUND VAC;  Surgeon: Wallace Going, DO;  Location: [facility];  Service: Plastics;  Laterality: Left;  . DIALYSIS/PERMA CATHETER INSERTION N/A 04/26/2017   Procedure: Dialysis/Perma Catheter Insertion;  Surgeon: Algernon Huxley, MD;  Location: [facility];  Service: Cardiovascular;  Laterality: N/A;  . INCISION AND DRAINAGE OF WOUND Left 05/28/2017   Procedure: IRRIGATION AND DEBRIDEMENT LEFT LOWER LEG WOUND;  Surgeon: Wallace Going, DO;  Location: [facility];  Service: Plastics;  Laterality: Left;  . PR DEBRIDEMENT, SKIN, SUB-Q TISSUE,=<20 SQ CM  05/21/2017      . PR DEBRIDEMENT, SKIN, SUB-Q TISSUE,EACH ADD 20 SQ CM  05/21/2017      . WRIST ARTHROSCOPY      Subjective  Subjective: Pt pleasant and agreeable to hydrotherapy Patient and Family Stated Goals: To heal wounds  Pain Score:    Objective  Cognition: Impaired:  Communication: WFL Mobility: Impaired:  ROM: Impaired:  Sensation: not tested Protective sensation (10g): not tested Signs of venous insufficiency:N/A Signs of arterial insufficiency: N/A  Wound Assessment  Pressure Injury 06/03/17 Unstageable - Full thickness tissue loss in which the base of the ulcer is covered by slough (yellow, tan, gray, green or brown) and/or eschar (tan, brown or black) in the wound bed. yellow slough in area on rt buttocks (Active)  Dressing Type ABD;Barrier Film (skin prep);Gauze (Comment);Moist to dry;Paper tape 06/14/2017  9:00 AM  Dressing Changed 06/14/2017  9:00 AM  Dressing Change Frequency Daily 06/14/2017  9:00 AM  State of Healing Eschar 06/14/2017  9:00 AM  Site / Wound Assessment Yellow  06/14/2017  9:00 AM  % Wound base Red or Granulating 0% 06/14/2017  9:00 AM  % Wound base Yellow/Fibrinous Exudate 100% 06/14/2017  9:00 AM  % Wound base Black/Eschar 0% 06/14/2017  9:00 AM  % Wound base Other/Granulation Tissue (Comment) 0% 06/14/2017  9:00 AM  Peri-wound Assessment Intact;Pink 06/14/2017  9:00 AM  Wound Length (cm) 0.2 cm 06/14/2017  9:00 AM  Wound Width (cm) 0.4 cm 06/14/2017  9:00 AM  Wound Depth (cm) 0.1 cm 06/14/2017  9:00 AM  Tunneling (cm) 0 06/14/2017  9:00 AM  Undermining (cm) 0 06/14/2017  9:00 AM  Margins Unattached edges (unapproximated) 06/14/2017  9:00 AM  Drainage Amount Scant 06/14/2017  9:00 AM  Drainage Description Purulent 06/14/2017  9:00 AM  Treatment Debridement (Selective);Hydrotherapy (Pulse lavage);Packing (Saline gauze) 06/14/2017  9:00 AM   Santyl applied to wound bed prior to applying dressing.   Pressure Injury 06/12/17 Unstageable - Full thickness tissue loss in which the base of the ulcer is covered by slough (yellow, tan, gray, green or brown) and/or eschar (tan, brown or black) in the wound bed. slough covered area on left buttocks (Active)  Dressing Type ABD;Barrier Film (skin prep);Gauze (Comment);Moist to dry;Paper tape 06/14/2017  9:00 AM  Dressing Changed 06/14/2017  9:00 AM  Dressing Change Frequency Daily 06/14/2017  9:00 AM  State of Healing Eschar 06/14/2017  9:00 AM  Site / Wound Assessment Yellow 06/14/2017  9:00 AM  % Wound base Red or Granulating 0% 06/14/2017  9:00 AM  % Wound base Yellow/Fibrinous Exudate 100% 06/14/2017  9:00 AM  % Wound base Black/Eschar 0% 06/14/2017  9:00 AM  % Wound base Other/Granulation Tissue (Comment) 0% 06/14/2017  9:00 AM  Peri-wound Assessment Intact;Pink 06/14/2017  9:00 AM  Wound Length (cm) 0.5 cm 06/14/2017  9:00 AM  Wound Width (cm) 1.5 cm 06/14/2017  9:00 AM  Wound Depth (cm) 0.1 cm 06/14/2017  9:00 AM  Tunneling (cm) 0 06/14/2017  9:00 AM  Undermining (cm) 0 06/14/2017  9:00 AM  Margins Unattached edges (unapproximated) 06/14/2017  9:00 AM   Drainage Amount Scant 06/14/2017  9:00 AM  Drainage Description No odor;Purulent 06/14/2017  9:00 AM  Treatment Debridement (Selective);Hydrotherapy (Pulse lavage);Packing (Saline gauze) 06/14/2017  9:00 AM   Santyl applied to wound bed prior to applying dressing.   Pressure Injury 06/14/17 Unstageable - Full thickness tissue loss in which the base of the ulcer is covered by slough (yellow, tan, gray, green or brown) and/or eschar (tan, brown or black) in the wound bed. (Active)  Dressing Type ABD;Barrier Film (skin prep);Gauze (Comment);Moist to dry;Paper tape 06/14/2017  9:00 AM  Dressing Changed 06/14/2017  9:00 AM  Dressing Change Frequency Daily 06/14/2017  9:00 AM  State of Healing Eschar 06/14/2017  9:00 AM  Site / Wound Assessment Yellow 06/14/2017  9:00 AM  % Wound base  Red or Granulating 0% 06/14/2017  9:00 AM  % Wound base Yellow/Fibrinous Exudate 100% 06/14/2017  9:00 AM  % Wound base Black/Eschar 0% 06/14/2017  9:00 AM  % Wound base Other/Granulation Tissue (Comment) 0% 06/14/2017  9:00 AM  Peri-wound Assessment Intact;Pink 06/14/2017  9:00 AM  Wound Length (cm) 3.2 cm 06/14/2017  9:00 AM  Wound Width (cm) 2 cm 06/14/2017  9:00 AM  Wound Depth (cm) 0.1 cm 06/14/2017  9:00 AM  Tunneling (cm) 0 06/14/2017  9:00 AM  Undermining (cm) 0 06/14/2017  9:00 AM  Margins Unattached edges (unapproximated) 06/14/2017  9:00 AM  Drainage Amount Scant 06/14/2017  9:00 AM  Drainage Description Purulent;No odor 06/14/2017  9:00 AM  Treatment Debridement (Selective);Hydrotherapy (Pulse lavage);Packing (Saline gauze) 06/14/2017  9:00 AM   Santyl applied to wound bed prior to applying dressing.   Pressure Injury 06/14/17 Unstageable - Full thickness tissue loss in which the base of the ulcer is covered by slough (yellow, tan, gray, green or brown) and/or eschar (tan, brown or black) in the wound bed. (Active)  Dressing Type ABD;Barrier Film (skin prep);Gauze (Comment);Moist to dry;Paper tape 06/14/2017  9:00 AM  Dressing Changed 06/14/2017   9:00 AM  Dressing Change Frequency Daily 06/14/2017  9:00 AM  State of Healing Eschar 06/14/2017  9:00 AM  Site / Wound Assessment Yellow 06/14/2017  9:00 AM  % Wound base Red or Granulating 0% 06/14/2017  9:00 AM  % Wound base Yellow/Fibrinous Exudate 100% 06/14/2017  9:00 AM  % Wound base Black/Eschar 0% 06/14/2017  9:00 AM  % Wound base Other/Granulation Tissue (Comment) 0% 06/14/2017  9:00 AM  Peri-wound Assessment Intact;Pink 06/14/2017  9:00 AM  Wound Length (cm) 0.6 cm 06/14/2017  9:00 AM  Wound Width (cm) 0.6 cm 06/14/2017  9:00 AM  Wound Depth (cm) 0.1 cm 06/14/2017  9:00 AM  Tunneling (cm) 0 06/14/2017  9:00 AM  Undermining (cm) 0 06/14/2017  9:00 AM  Margins Unattached edges (unapproximated) 06/14/2017  9:00 AM  Drainage Amount Scant 06/14/2017  9:00 AM  Drainage Description Purulent 06/14/2017  9:00 AM  Treatment Debridement (Selective);Hydrotherapy (Pulse lavage);Packing (Saline gauze) 06/14/2017  9:00 AM   Santyl applied to wound bed prior to applying dressing.    Hydrotherapy Pulsed lavage therapy - wound location: x4 buttock wounds Pulsed Lavage with Suction (psi): 12 psi Pulsed Lavage with Suction - Normal Saline Used: 1000 mL Pulsed Lavage Tip: Tip with splash shield Selective Debridement Selective Debridement - Location: x4 buttock wounds Selective Debridement - Tools Used: Forceps;Scalpel Selective Debridement - Tissue Removed: yellow unviable tissue   Wound Assessment and Plan  Wound Therapy - Assess/Plan/Recommendations Wound Therapy - Clinical Statement: Pt presents to hydrotherapy with x4 buttock wounds covered in 100% yellow/fibrinous exudate. Pt will benefit from continued hydrotherapy for selective removal of unviable tissue, and to promote wound bed healing.  Wound Therapy - Functional Problem List: Decreased tolerance for sitting up OOB Factors Delaying/Impairing Wound Healing: Incontinence;Immobility;Multiple medical problems Hydrotherapy Plan: Debridement;Dressing  change;Patient/family education;Pulsatile lavage with suction Wound Therapy - Frequency: 6X / week Wound Therapy - Follow Up Recommendations: Home health RN Wound Plan: See above  Wound Therapy Goals- Improve the function of patient's integumentary system by progressing the wound(s) through the phases of wound healing (inflammation - proliferation - remodeling) by: Decrease Necrotic Tissue to: 0% Decrease Necrotic Tissue - Progress: Goal set today Increase Granulation Tissue to: 100% Increase Granulation Tissue - Progress: Goal set today Goals/treatment plan/discharge plan were made with and agreed upon by  patient/family: Yes Time For Goal Achievement: 7 days Wound Therapy - Potential for Goals: Good  Goals will be updated until maximal potential achieved or discharge criteria met.  Discharge criteria: when goals achieved, discharge from hospital, MD decision/surgical intervention, no progress towards goals, refusal/missing three consecutive treatments without notification or medical reason.  Thelma Comp 06/14/2017, 9:39 AM   Rolinda Roan, PT, DPT Acute Rehabilitation Services Pager: [phone]

## 2017-06-14 NOTE — Progress Notes (Signed)
Nutrition Follow-up  DOCUMENTATION CODES:   Not applicable  INTERVENTION:  Provide Nepro Shake po BID, each supplement provides 425 kcal and 19 grams protein  Discontinue Prostat.  Encourage adequate PO intake.   NUTRITION DIAGNOSIS:   Increased nutrient needs related to wound healing as evidenced by estimated needs; ongoing  GOAL:   Patient will meet greater than or equal to 90% of their needs; progressing  MONITOR:   PO intake, Supplement acceptance, Weight trends, Labs, Skin, I & O's  REASON FOR ASSESSMENT:   Consult Diet education  ASSESSMENT:   Pt admitted to CIR with critical illness myopathy.  Meal completion has been varied from 25-100% with 60% at lunch today. Pt currently has Prostat ordered however has been refusing them. RD to order Nepro Shake instead to aid in adequate caloric and protein needs.   Labs and medications reviewed.   Diet Order:  Diet renal with fluid restriction Fluid restriction: 1200 mL Fluid; Room service appropriate? Yes; Fluid consistency: Thin  Skin:  Wound (see comment) (Unstageable to buttocks, wound VAC on L leg)  Last BM:  9/5  Height:   Ht Readings from Last 1 Encounters:  06/03/17 5\' 5"  (1.651 m)    Weight:   Wt Readings from Last 1 Encounters:  06/14/17 154 lb 10 oz (70.1 kg)    Ideal Body Weight:  56.8 kg  BMI:  Body mass index is 25.73 kg/m.  Estimated Nutritional Needs:   Kcal:  1800-2000  Protein:  95-110 grams  Fluid:  1.2 L/day  EDUCATION NEEDS:   Education needs addressed  Corrin Parker, MS, RD, LDN Pager # [phone] After hours/ weekend pager # [phone]

## 2017-06-14 NOTE — Plan of Care (Signed)
Problem: RH Toileting Goal: LTG Patient will perform toileting w/assist, cues/equip (OT) LTG: Patient will perform toiletiing (clothes management/hygiene) with assist, with/without cues using equipment (OT)  Downgraded due to slow progress towards goals

## 2017-06-14 NOTE — Progress Notes (Signed)
Occupational Therapy Session Note  Patient Details  Name: [name] MRN: [id number] Date of Birth: 1951/05/25  Today's Date: 06/14/2017 OT Individual Time: 4166-0630 OT Individual Time Calculation (min): 55 min    Short Term Goals: Week 1:  OT Short Term Goal 1 (Week 1): Pt will complete lateral leans with min assist and mod cues to decrease burden of care with toileting hygiene OT Short Term Goal 2 (Week 1): Pt will complete LB dressing at bed level with min assist OT Short Term Goal 3 (Week 1): Pt will complete slide board transfer to drop arm BSC with min assist  Skilled Therapeutic Interventions/Progress Updates:    Pt seen to focus on self-care tasks/ADLs and bed mobility. Pt received in bed eating breakfast with no c/o of pain. Pt performed LB bathing supine in bed for peri hygiene with min. A to wash buttocks area. Pt completed LB bathing with HOB raised to allow pt to wash LE with more independence requiring min. A for LE placement. Pt completed LB dressing with min. A and mod verbal cues for problem-solving and sequencing for clothing management. Therapist provided physical assist to obtain LE placement to increase independence with threading pants. UB bathing completed sitting EOB with supervision/set-up. Therapist provided min. A and verbal cues for UB dressing due to pt fatigue vs problem-solving/sequencing. Pt completed grooming tasks EOB with set-up. Pt able to roll supine <> left/right with light min. A for bed mobility and lessen burden of care. Sit <> lying requiring mod. A from therapist to lift BLE into bed due to decreased strength and problem-solving/sequencing. Pt left supine in bed with all needs in reach.  Therapy Documentation Precautions:  Precautions Precautions: Fall Precaution Comments: R IJ perm cath; wound vac LLE; LUE DVT Restrictions Weight Bearing Restrictions: No General:   Vital Signs:   Pain: Pain Assessment Pain Assessment: No/denies pain  See  Function Navigator for Current Functional Status.   Therapy/Group: Individual Therapy  Hoyt Koch 06/14/2017, 12:17 PM

## 2017-06-14 NOTE — Progress Notes (Signed)
Physical Therapy Session Note  Patient Details  Name: Latima Hamza MRN: [id number] Date of Birth: 03/24/1951  Today's Date: 06/14/2017 PT Individual Time: 1130-1200 PT Individual Time Calculation (min): 30 min   Short Term Goals: Week 2:  PT Short Term Goal 1 (Week 2): = LTGs  Skilled Therapeutic Interventions/Progress Updates:   Pt in w/c upon arrival and agreeable to therapy, no c/o pain. Husband present and PT asked both pt and husband if there was anything else they would like to work on while husband was present. Both denied anything specific and expressed concern about pt's LE and UE strength. Worked on LE/UE strength and endurance this session on NuStep - 10 min @ L1 w/ multiple rest breaks secondary to fatigue. Max A x2 transfer to/from NuStep. Ended session supine in bed, call bell within reach and all needs met.   Therapy Documentation Precautions:  Precautions Precautions: Fall Precaution Comments: R IJ perm cath; wound vac LLE; LUE DVT Restrictions Weight Bearing Restrictions: No Pain: Pain Assessment Pain Assessment: No/denies pain  See Function Navigator for Current Functional Status.   Therapy/Group: Individual Therapy  Renell Coaxum K Arnette 06/14/2017, 12:09 PM

## 2017-06-14 NOTE — Progress Notes (Signed)
Physical Therapy Note  Patient Details  Name: [name] MRN: [id number] Date of Birth: 1951/07/22 Today's Date: 06/14/2017    Time: [id number] 60 minutes  1:1 No c/o pain.  Pt's husband present for pt/family education.  Session focused on transfer training.  Sliding board transfers to even and uneven surfaces with PT demonstrating and pt/husband performing at min/mod A level.  Pt's husband able to perform w/c set up and parts management with minimal cuing.  Attempt stand pivot transfer without RW for car transfers.  PT demonstrates sit to stand with max A.  Pt's husband performs sit to stand multiple attempts with max A.  Attempt stand pivot x 4 attempts to perform car transfer. On first 3 attempts pt unable to pivot feet. On final attempt pt's LEs slipped out from under her and required PT to assist to prevent fall.  Pt/husband educated on PTs recommendation to borrow a car for going home and then to use transportation for dialysis and MD appointments.  Pt's husband still resistant to this recommendation.   Devon Pretty 06/14/2017, 11:03 AM

## 2017-06-14 NOTE — Progress Notes (Signed)
Occupational Therapy Session Note  Patient Details  Name: Brandyn Thien MRN: [id number] Date of Birth: Mar 25, 1951  Today's Date: 06/14/2017 OT Individual Time: 9292-4462 OT Individual Time Calculation (min): 45 min    Short Term Goals: Week 1:  OT Short Term Goal 1 (Week 1): Pt will complete lateral leans with min assist and mod cues to decrease burden of care with toileting hygiene OT Short Term Goal 1 - Progress (Week 1): Met OT Short Term Goal 2 (Week 1): Pt will complete LB dressing at bed level with min assist OT Short Term Goal 2 - Progress (Week 1): Met OT Short Term Goal 3 (Week 1): Pt will complete slide board transfer to drop arm BSC with min assist OT Short Term Goal 3 - Progress (Week 1): Met  Skilled Therapeutic Interventions/Progress Updates:    Treatment to focus on functional transfers and hygiene management for preparation for d/c. Pt received in bed reporting fatigue but willing to participate in therapy session. Pt able to complete lying <> sit with mod. A due to decreased strength and ability to sequence/problem-solve. Pt completed slide board transfer bed > drop arm BSC with min. A and verbal cues for sequence of transfer. Pt performing lateral leans on BSC in attempt to doff pants to use toilet. Therapist provided tactile and verbal cues and min. A for pt to perform lateral leans. However, due to lack of strength and problem-solving, pt required max. A to complete doffing pants in order to use toilet and complete toilet hygiene. Slide board transfer BSC > bed requiring mod. A due to decreased problem-solving skills and muscle weakness. Pt left in bed with all needs in reach and RN aware current need to further assess skin.  Therapy Documentation Precautions:  Precautions Precautions: Fall Precaution Comments: R IJ perm cath; wound vac LLE; LUE DVT Restrictions Weight Bearing Restrictions: No   See Function Navigator for Current Functional Status.   Therapy/Group:  Individual Therapy  Hoyt Koch 06/14/2017, 2:53 PM

## 2017-06-14 NOTE — Progress Notes (Signed)
Patient arrived to unit by bed.  Reviewed treatment plan and this RN agrees with plan.  Report received from bedside RN, Felizardo Hoffmann.  Consent verified.  Patient A & o X 4.   Lung sounds diminished to ausculation in all fields. Generalized edema. Cardiac:  Afib, RVR.  Removed caps and cleansed RIJ catheter with chlorhedxidine.  Aspirated ports of heparin and flushed them with saline per protocol.  Connected and secured lines, initiated treatment at 1626.  UF Goal of 2000 mL and net fluid removal 1.5 L.  Will continue to monitor.

## 2017-06-14 NOTE — Procedures (Signed)
I was present at this dialysis session. I have reviewed the session itself and made appropriate changes.   Filed Weights   06/13/17 0420 06/14/17 0554 06/14/17 1617  Weight: 70 kg (154 lb 5.2 oz) 70.1 kg (154 lb 10 oz) 70 kg (154 lb 5.2 oz)     Recent Labs Lab 06/09/17 2249 06/12/17 1529  NA 135 138  K 2.8* 6.0*  CL 97* 102  CO2 26 22  GLUCOSE 100* 106*  BUN 32* 40*  CREATININE 3.85* 4.37*  CALCIUM 8.3* 9.2  PHOS 2.9  --      Recent Labs Lab 06/09/17 2249 06/11/17 0419 06/13/17 0601  WBC 2.5* 3.1* 2.9*  NEUTROABS  --  0.0* 0.1*  HGB 10.0* 10.8* 10.5*  HCT 32.4* 35.6* 33.4*  33.0*  MCV 108.4* 111.3* 109.2*  PLT 383 329 268    Scheduled Meds: . amiodarone  400 mg Oral BID  . apixaban  5 mg Oral BID  . collagenase   Topical Daily  . darbepoetin (ARANESP) injection - DIALYSIS  60 mcg Intravenous Q Thu-HD  . famotidine  20 mg Oral QHS  . feeding supplement (NEPRO CARB STEADY)  237 mL Oral BID BM  . hydrocerin   Topical BID  . levothyroxine  100 mcg Oral QAC breakfast  . megestrol  40 mg Oral BID  . metoprolol tartrate  50 mg Oral BID  . midodrine  10 mg Oral TID WC  . multivitamin  1 tablet Oral QHS  . polyethylene glycol  17 g Oral BID  . QUEtiapine  25 mg Oral QHS  . senna-docusate  2 tablet Oral BID  . sertraline  25 mg Oral QHS  . sucroferric oxyhydroxide  500 mg Oral TID WC   Continuous Infusions: . sodium chloride    . sodium chloride    . ferric gluconate (FERRLECIT/NULECIT) IV 125 mg (06/12/17 1758)   PRN Meds:.sodium chloride, sodium chloride, acetaminophen, ALPRAZolam, alteplase, alum & mag hydroxide-simeth, bisacodyl, diphenhydrAMINE, guaiFENesin-dextromethorphan, heparin, heparin, ipratropium-albuterol, lidocaine (PF), lidocaine-prilocaine, pentafluoroprop-tetrafluoroeth, prochlorperazine **OR** prochlorperazine **OR** prochlorperazine, traZODone   Donetta Potts,  MD 06/14/2017, 4:39 PM

## 2017-06-14 NOTE — Progress Notes (Signed)
Occupational Therapy Weekly Progress Note  Patient Details  Name: [name] MRN: [id number] Date of Birth: 03/29/1951  Beginning of progress report period: June 07, 2017 End of progress report period: June 14, 2017  Patient has met 3 of 3 short term goals.  Pt is making slow progress towards goals.  Pt currently requires mod-max cues for sequencing and problem solving during basic ADLs with bathing and dressing tasks.  Currently recommend completing LB bathing and dressing at bed level to decrease burden of care as pt continues to require +2 for functional standing balance.  Pt currently mod-min assist with lateral scoot transfers with slide board requiring increased assist when surface is unlevel.  Recommend repetition for increased carryover, however pt still requiring increased cues due to weakness and cognitive/memory impairments.  Family education is scheduled to begin to assess husband's ability to provide for pt at current min-mod assist level.  Patient continues to demonstrate the following deficits: muscle weakness, decreased problem solving and decreased memory and decreased standing balance and decreased postural control and decreased balance strategies and therefore will continue to benefit from skilled OT intervention to enhance overall performance with BADL and Reduce care partner burden.  Patient progressing toward long term goals..  Continue plan of care.  OT Short Term Goals Week 1:  OT Short Term Goal 1 (Week 1): Pt will complete lateral leans with min assist and mod cues to decrease burden of care with toileting hygiene OT Short Term Goal 1 - Progress (Week 1): Met OT Short Term Goal 2 (Week 1): Pt will complete LB dressing at bed level with min assist OT Short Term Goal 2 - Progress (Week 1): Met OT Short Term Goal 3 (Week 1): Pt will complete slide board transfer to drop arm BSC with min assist OT Short Term Goal 3 - Progress (Week 1): Met Week 2:  OT Short Term Goal  1 (Week 2): STG = LTGs due to remaining LOS   Therapy Documentation Precautions:  Precautions Precautions: Fall Precaution Comments: R IJ perm cath; wound vac LLE; LUE DVT Restrictions Weight Bearing Restrictions: No General:   Vital Signs: Therapy Vitals Temp: 98.2 F (36.8 C) Temp Source: Oral Pulse Rate: 73 Resp: 18 BP: 99/67 Patient Position (if appropriate): Lying Oxygen Therapy SpO2: 100 % O2 Device: Not Delivered Pain:   Pt with no c/o pain  See Function Navigator for Current Functional Status.   [name] 06/14/2017, 7:43 AM

## 2017-06-14 NOTE — Progress Notes (Signed)
Dialysis treatment completed.  2000 mL ultrafiltrated.  1500 mL net fluid removal.  Patient status unchanged. Lung sounds diminished to ausculation in all fields. Generalized edema. Cardiac: Afib.  Cleansed RIJ catheter with chlorhexidine.  Disconnected lines and flushed ports with saline per protocol.  Ports locked with heparin and capped per protocol.    Report given to bedside, RN Tonya.

## 2017-06-14 NOTE — Progress Notes (Addendum)
[place] PHYSICAL MEDICINE & REHABILITATION     PROGRESS NOTE  Subjective/Complaints:  Pt seen laying in bed this AM.  She slept well overnight.  She is not aware PRS will evaluate her leg wound today.    ROS: Denies CP, SOB, N/V.  Objective: Vital Signs: Blood pressure 99/67, pulse 73, temperature 98.2 F (36.8 C), temperature source Oral, resp. rate 18, weight 70.1 kg (154 lb 10 oz), SpO2 100 %. Ct Abdomen Pelvis W Contrast  Result Date: 06/12/2017 CLINICAL DATA:  Initial workup for endometrial carcinoma. EXAM: CT ABDOMEN AND PELVIS WITH CONTRAST TECHNIQUE: Multidetector CT imaging of the abdomen and pelvis was performed using the standard protocol following bolus administration of intravenous contrast. CONTRAST:  75 mL Isovue 300 was injected into the existing IV in the right antecubital fossa. Examination was complicated by extravasation of contrast material into the right antecubital fossa. None of the injected contrast material into the vascular system in the examination is essentially a noncontrast examination. No IV access was available and no additional contrast-enhanced images were obtained. COMPARISON:  04/04/2017 FINDINGS: Lower chest: Small right pleural effusion. Atelectasis in both lung bases. Mild cardiac enlargement. Coronary artery calcifications. Residual contrast material in the lower esophagus without dilatation. This may indicate reflux or dysmotility. Hepatobiliary: Liver parenchyma appears homogeneous. Evaluation for metastatic disease is limited without contrast material. The gallbladder is mildly distended and multiple stones are present. Stones are present in the gallbladder neck. No gallbladder wall thickening or edema. No bile duct dilatation. Pancreas: Unremarkable. No pancreatic ductal dilatation or surrounding inflammatory changes. Spleen: Normal in size without focal abnormality. Adrenals/Urinary Tract: No adrenal gland nodules. Cyst in the midpole left kidney. No  hydronephrosis in either kidney. No stones identified. Bladder wall is not thickened. Stomach/Bowel: Stomach is within normal limits. Appendix appears normal. No evidence of bowel wall thickening, distention, or inflammatory changes. Vascular/Lymphatic: Aortic atherosclerosis. No enlarged abdominal or pelvic lymph nodes. Reproductive: Uterus and ovaries are not enlarged. 1.4 cm exophytic structure off of the uterine do not month probably represents a small fibroid. Fat planes around the uterus and cervix appear intact. No obvious endometrial thickening. Other: No abdominal wall hernia or abnormality. No abdominopelvic ascites. Musculoskeletal: Degenerative changes in the spine. No destructive or expansile bone lesions are appreciated. CONTRAST EXTRAVASATION CONSULTATION: Type of contrast:  Isovue 300 Site of extravasation: Right antecubital fossa Estimated volume of extravasation: 75 ml Area of extravasation scanned with CT? Partially included within the field of view. PATIENT'S SIGNS AND SYMPTOMS Skin blistering/ulceration: no Decrease capillary refill: no Change in skin color: No. Pre-existing bruising was present above the antecubital fossa. Decreased motor function or severe tightness: no Decreased pulses distal to site of extravasation: no Altered sensation: no Increasing pain or signs of increased swelling during observation: no. Patient complaining of no pain during or after the extravasation event. TREATMENT Observation period at site: Yes Limb elevation: yes Ice packs applied: yes Heat pads applied: None Plastic surgery consulted? no DOCUMENTATION AND FOLLOW-UP Site contrast extravasation forms submitted? yes Post extravasation orders completed? yes Was additional follow up assigned to PA's? no Patient's questions answered? yes Patient instructed to call [phone] or seek immediate medical care if symptoms progress. IMPRESSION: 1. No definite evidence of metastatic disease although lack of contrast  material limits evaluation in some areas. 2. Cholelithiasis with mild gallbladder distention. Can't exclude early change of cholecystitis. 3. Aortic atherosclerosis. 4. Examination complicated by intravenous contrast extravasation. See above. Electronically Signed   By: Oren Beckmann.D.  On: 06/12/2017 23:54    Recent Labs  06/13/17 0601  WBC 2.9*  HGB 10.5*  HCT 33.4*  PLT 268    Recent Labs  06/12/17 1529  NA 138  K 6.0*  CL 102  GLUCOSE 106*  BUN 40*  CREATININE 4.37*  CALCIUM 9.2   CBG (last 3)  No results for input(s): GLUCAP in the last 72 hours.  Wt Readings from Last 3 Encounters:  06/14/17 70.1 kg (154 lb 10 oz)  06/05/17 72.5 kg (159 lb 13.3 oz)  06/02/17 64.5 kg (142 lb 3.2 oz)    Physical Exam:  BP 99/67 (BP Location: Right Arm)   Pulse 73   Temp 98.2 F (36.8 C) (Oral)   Resp 18   Wt 70.1 kg (154 lb 10 oz)   SpO2 100%   BMI 25.73 kg/m  Constitutional: She appears well-developed and well-nourished. No distress.  HENT: Normocephalic and atraumatic.  Eyes: EOMI. No discharge.  Cardiovascular: IRIR.No JVD Respiratory:  Resp: Effort normal and breath sounds normal.  GI: Bowel sounds are normal. She exhibits no distension. Musculoskeletal: She exhibits no edema or tenderness.  Neurological: She is alert and oriented.  Motor: B/l UE: 4+/5 proximal to distal B/l LE: HF 3-/5, KE 3/5, ADF/PF 3+/5 (unchanged) Skin: Skin is warm and dry. She is not diaphoretic. Right foot with healed abrasions.  LLE with ulcer and dressing clean.   Sacral ulcer, not examined today Psychiatric: She has a normal mood and affect. Her behavior is normal. Judgment and thought content normal.   Assessment/Plan: 1. 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 Physiatrist is providing close team supervision and 24 hour management of active medical problems listed below. Physiatrist  and rehab team continue to assess barriers to discharge/monitor patient progress toward functional and medical goals.  Function:  Bathing Bathing position   Position: Bed (LB at bed level; UB in w/c at sink)  Bathing parts Body parts bathed by patient: Right arm, Left arm, Chest, Abdomen, Front perineal area, Right upper leg, Left upper leg, Right lower leg Body parts bathed by helper: Back, Buttocks  Bathing assist Assist Level: Touching or steadying assistance(Pt > 75%)   Set up : To obtain items  Upper Body Dressing/Undressing Upper body dressing   What is the patient wearing?: Pull over shirt/dress     Pull over shirt/dress - Perfomed by patient: Thread/unthread right sleeve, Thread/unthread left sleeve, Put head through opening, Pull shirt over trunk Pull over shirt/dress - Perfomed by helper: Pull shirt over trunk        Upper body assist Assist Level: Supervision or verbal cues   Set up : To obtain clothing/put away  Lower Body Dressing/Undressing Lower body dressing   What is the patient wearing?: Pants, Non-skid slipper socks     Pants- Performed by patient: Thread/unthread right pants leg, Thread/unthread left pants leg, Pull pants up/down Pants- Performed by helper: Thread/unthread left pants leg Non-skid slipper socks- Performed by patient: Don/doff right sock Non-skid slipper socks- Performed by helper: Don/doff left sock                  Lower body assist Assist for lower body dressing: Touching or steadying assistance (Pt > 75%)      Toileting Toileting     Toileting steps completed by helper: Adjust clothing prior to toileting, Performs perineal hygiene, Adjust clothing after toileting    Toileting assist  Transfers Chair/bed transfer Chair/bed transfer activity did not occur: Safety/medical concerns (incontinent of bowel, time contraints) Chair/bed transfer method: Lateral scoot Chair/bed transfer assist level: Moderate assist (Pt 50 -  74%/lift or lower) Chair/bed transfer assistive device: Sliding board     Locomotion Ambulation Ambulation activity did not occur: Safety/medical concerns (incontinent of bowel, time constraints)   Max distance: 5 Assist level: 2 helpers   Oceanographer activity did not occur: Safety/medical concerns (incontinent of bowel; time constraints) Type: Manual Max wheelchair distance: 169ft  Assist Level: Touching or steadying assistance (Pt > 75%)  Cognition Comprehension Comprehension assist level: Understands complex 90% of the time/cues 10% of the time  Expression Expression assist level: Expresses complex 90% of the time/cues < 10% of the time  Social Interaction Social Interaction assist level: Interacts appropriately with others with medication or extra time (anti-anxiety, antidepressant).  Problem Solving Problem solving assist level: Solves basic 90% of the time/requires cueing < 10% of the time  Memory Memory assist level: Recognizes or recalls 75 - 89% of the time/requires cueing 10 - 24% of the time    Medical Problem List and Plan: 1.  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  Cont CIR 2.  DVT Prophylaxis/Anticoagulation: Pharmaceutical: Cont Eliquis  3. Pain Management: tylenol prn  4. Mood: Remains motivated. LCSW to follow for support.  5. Neuropsych: This patient is not fully capable of making decisions on her own behalf. 6. Skin/Wound Care: Maintain adequate nutritional status to help promote healing.   VAC d/ced, spoke with PRS PA, MD to evaluate today for further recs.  Hydrotherapy for sacral ulcer 7. Fluids/Electrolytes/Nutrition: Monitor I/Os. Continue megace to help with appetite. On cardiac diet to help with food choices--add 1200 cc/FR 8. A fib with RVR: To continue amiodarone at 400 mg bid and metoprolol 50 mg bid.  9. CKD, now dialysis dependent: AVG postponed for now. HD dependent--Tue/Thu/Sat schedule at end of day to  help with tolerance of therapy. 10. Hypotension: On amiodarone 10 mg tid.  Relatively controlled 9/6 11. Anemia of chronic disease: IV iron for iron deficiency. On aranesp weekly.  Labs with HD  Hb 10.5 on 9/5 12. Left foot wound: Wound VAC over Acell for healing--dressing changes weekly by plastics.  13. Anxiety/depression: Team to provide ego support.  Continue Seroquel and Zoloft.  14. Constipation: Continue miralax and linzess. Increase senna S to bid.  15. Neurtropenia  WBCs 2.9 on  9/5  Cont to monitor  Consulted Heme/Onc due to neutropenia and previous transvaginal U/S concerning for endometrial CA  CT reviewed, limited, but relatively unremarkable for abdominal pathology  Heme/Onc ordered further workup, appreciate recs.  Repeat labs ordered  LOS (Days) 8 A FACE TO FACE EVALUATION WAS PERFORMED  Ankit Lorie Phenix 06/14/2017 8:17 AM

## 2017-06-15 ENCOUNTER — Inpatient Hospital Stay (HOSPITAL_COMMUNITY): Payer: Medicare Other | Admitting: Occupational Therapy

## 2017-06-15 ENCOUNTER — Ambulatory Visit (HOSPITAL_COMMUNITY): Payer: Medicare Other

## 2017-06-15 ENCOUNTER — Encounter: Payer: Medicare Other | Admitting: Physical Medicine & Rehabilitation

## 2017-06-15 DIAGNOSIS — L97922 Non-pressure chronic ulcer of unspecified part of left lower leg with fat layer exposed: Secondary | ICD-10-CM

## 2017-06-15 LAB — CBC WITH DIFFERENTIAL/PLATELET
Basophils Absolute: 0.1 10*3/uL (ref 0.0–0.1)
Basophils Relative: 2 %
EOS PCT: 0 %
Eosinophils Absolute: 0 10*3/uL (ref 0.0–0.7)
HEMATOCRIT: 34.9 % — AB (ref 36.0–46.0)
Hemoglobin: 11 g/dL — ABNORMAL LOW (ref 12.0–15.0)
LYMPHS ABS: 1.3 10*3/uL (ref 0.7–4.0)
Lymphocytes Relative: 25 %
MCH: 34.3 pg — AB (ref 26.0–34.0)
MCHC: 31.5 g/dL (ref 30.0–36.0)
MCV: 108.7 fL — ABNORMAL HIGH (ref 78.0–100.0)
MONO ABS: 1.8 10*3/uL — AB (ref 0.1–1.0)
Monocytes Relative: 34 %
NEUTROS ABS: 2 10*3/uL (ref 1.7–7.7)
Neutrophils Relative %: 39 %
Platelets: 311 10*3/uL (ref 150–400)
RBC: 3.21 MIL/uL — AB (ref 3.87–5.11)
RDW: 22.8 % — AB (ref 11.5–15.5)
WBC: 5.2 10*3/uL (ref 4.0–10.5)

## 2017-06-15 LAB — METHYLMALONIC ACID, SERUM: Methylmalonic Acid, Quantitative: 336 nmol/L (ref 0–378)

## 2017-06-15 NOTE — Progress Notes (Signed)
Occupational Therapy Session Note  Patient Details  Name: Dell Hurtubise MRN: [id number] Date of Birth: [birth year]  Today's Date: 06/15/2017 OT Individual Time: 5449-2010 OT Individual Time Calculation (min): 25 min    Short Term Goals: Week 2:  OT Short Term Goal 1 (Week 2): STG = LTGs due to remaining LOS  Skilled Therapeutic Interventions/Progress Updates:    1:1. Pt requesting to groom this session. Pt requires increased time for sequencing and MOD A for supine HOB elevated>EOB. Pt demonstrating more difficulty with sequencing this session often waiting for cue from OT. Pt brushes teeth/hair and washes face with supervision sitting EOB. Pt unable to problem solve opening mouth wash cap by squeexing tabs and turning cap open. Pt completes BUE  UB therex in all planes with 2 LOB laterally sitting EOB with tactile/Vc for technique. Exited session with pt supine in bed with PT hydrotherapy entering.  Therapy Documentation Precautions:  Precautions Precautions: Fall Precaution Comments: R IJ perm cath; wound vac LLE; LUE DVT Restrictions Weight Bearing Restrictions: No  See Function Navigator for Current Functional Status.   Therapy/Group: Individual Therapy  Tonny Branch 06/15/2017, 8:42 AM

## 2017-06-15 NOTE — Progress Notes (Signed)
Physical Therapy Wound Treatment Patient Details  Name: [name] MRN: [id number] Date of Birth: Feb 29, 1951  Today's Date: 06/15/2017 PT Individual Time: 0835-0910 PT Individual Time Calculation (min): 35 min   Subjective  Subjective: Pt pleasant and agreeable to hydrotherapy Patient and Family Stated Goals: To heal wounds  Pain Score:  Pt reports minimal pain throughout treatment. Premedicated per RN.    Wound Assessment  Pressure Injury 06/03/17 Unstageable - Full thickness tissue loss in which the base of the ulcer is covered by slough (yellow, tan, gray, green or brown) and/or eschar (tan, brown or black) in the wound bed. yellow slough in area on rt buttocks (Active)  Dressing Type ABD;Barrier Film (skin prep);Gauze (Comment);Moist to dry;Paper tape 06/15/2017  9:43 AM  Dressing Changed 06/15/2017  9:43 AM  Dressing Change Frequency Daily 06/15/2017  9:43 AM  State of Healing Eschar 06/15/2017  9:43 AM  Site / Wound Assessment Yellow 06/15/2017  9:43 AM  % Wound base Red or Granulating 0% 06/15/2017  9:43 AM  % Wound base Yellow/Fibrinous Exudate 100% 06/15/2017  9:43 AM  % Wound base Black/Eschar 0% 06/15/2017  9:43 AM  % Wound base Other/Granulation Tissue (Comment) 0% 06/15/2017  9:43 AM  Peri-wound Assessment Intact;Pink 06/15/2017  9:43 AM  Wound Length (cm) 0.2 cm 06/14/2017  9:00 AM  Wound Width (cm) 0.4 cm 06/14/2017  9:00 AM  Wound Depth (cm) 0.1 cm 06/14/2017  9:00 AM  Tunneling (cm) 0 06/14/2017  9:00 AM  Undermining (cm) 0 06/14/2017  9:00 AM  Margins Unattached edges (unapproximated) 06/15/2017  9:43 AM  Drainage Amount Scant 06/15/2017  9:43 AM  Drainage Description Purulent 06/15/2017  9:43 AM  Treatment Debridement (Selective);Hydrotherapy (Pulse lavage);Packing (Saline gauze) 06/15/2017  9:43 AM   Santyl applied to wound bed prior to applying dressing.   Pressure Injury 06/12/17 Unstageable - Full thickness tissue loss in which the base of the ulcer is covered by slough (yellow, tan, gray, green  or brown) and/or eschar (tan, brown or black) in the wound bed. slough covered area on left buttocks (Active)  Dressing Type ABD;Barrier Film (skin prep);Gauze (Comment);Moist to dry;Paper tape 06/15/2017  9:43 AM  Dressing Changed 06/15/2017  9:43 AM  Dressing Change Frequency Daily 06/15/2017  9:43 AM  State of Healing Eschar 06/15/2017  9:43 AM  Site / Wound Assessment Yellow 06/15/2017  9:43 AM  % Wound base Red or Granulating 0% 06/15/2017  9:43 AM  % Wound base Yellow/Fibrinous Exudate 100% 06/15/2017  9:43 AM  % Wound base Black/Eschar 0% 06/15/2017  9:43 AM  % Wound base Other/Granulation Tissue (Comment) 0% 06/15/2017  9:43 AM  Peri-wound Assessment Intact;Pink 06/15/2017  9:43 AM  Wound Length (cm) 0.5 cm 06/14/2017  9:00 AM  Wound Width (cm) 1.5 cm 06/14/2017  9:00 AM  Wound Depth (cm) 0.1 cm 06/14/2017  9:00 AM  Tunneling (cm) 0 06/14/2017  9:00 AM  Undermining (cm) 0 06/14/2017  9:00 AM  Margins Unattached edges (unapproximated) 06/15/2017  9:43 AM  Drainage Amount Scant 06/15/2017  9:43 AM  Drainage Description No odor;Purulent 06/15/2017  9:43 AM  Treatment Debridement (Selective);Hydrotherapy (Pulse lavage);Packing (Saline gauze) 06/15/2017  9:43 AM   Santyl applied to wound bed prior to applying dressing.   Pressure Injury 06/14/17 Unstageable - Full thickness tissue loss in which the base of the ulcer is covered by slough (yellow, tan, gray, green or brown) and/or eschar (tan, brown or black) in the wound bed. (Active)  Dressing Type ABD;Barrier Film (skin prep);Gauze (  Comment);Moist to dry;Paper tape 06/15/2017  9:43 AM  Dressing Changed 06/15/2017  9:43 AM  Dressing Change Frequency Daily 06/15/2017  9:43 AM  State of Healing Non-healing 06/15/2017  9:43 AM  Site / Wound Assessment Yellow;Brown;Pink 06/15/2017  9:43 AM  % Wound base Red or Granulating 30% 06/15/2017  9:43 AM  % Wound base Yellow/Fibrinous Exudate 60% 06/15/2017  9:43 AM  % Wound base Black/Eschar 10% 06/15/2017  9:43 AM  % Wound base Other/Granulation  Tissue (Comment) 0% 06/15/2017  9:43 AM  Peri-wound Assessment Intact;Pink 06/15/2017  9:43 AM  Wound Length (cm) 3.2 cm 06/14/2017  9:00 AM  Wound Width (cm) 2 cm 06/14/2017  9:00 AM  Wound Depth (cm) 0.1 cm 06/14/2017  9:00 AM  Tunneling (cm) 0 06/14/2017  9:00 AM  Undermining (cm) 0 06/14/2017  9:00 AM  Margins Unattached edges (unapproximated) 06/15/2017  9:43 AM  Drainage Amount Scant 06/15/2017  9:43 AM  Drainage Description Purulent;No odor 06/15/2017  9:43 AM  Treatment Debridement (Selective);Hydrotherapy (Pulse lavage);Packing (Saline gauze) 06/15/2017  9:43 AM   Santyl applied to wound bed prior to applying dressing.   Pressure Injury 06/14/17 Unstageable - Full thickness tissue loss in which the base of the ulcer is covered by slough (yellow, tan, gray, green or brown) and/or eschar (tan, brown or black) in the wound bed. (Active)  Dressing Type ABD;Barrier Film (skin prep);Gauze (Comment);Moist to dry;Paper tape 06/15/2017  9:43 AM  Dressing Changed 06/15/2017  9:43 AM  Dressing Change Frequency Daily 06/15/2017  9:43 AM  State of Healing Eschar 06/15/2017  9:43 AM  Site / Wound Assessment Yellow 06/15/2017  9:43 AM  % Wound base Red or Granulating 0% 06/15/2017  9:43 AM  % Wound base Yellow/Fibrinous Exudate 100% 06/15/2017  9:43 AM  % Wound base Black/Eschar 0% 06/15/2017  9:43 AM  % Wound base Other/Granulation Tissue (Comment) 0% 06/15/2017  9:43 AM  Peri-wound Assessment Intact;Pink 06/15/2017  9:43 AM  Wound Length (cm) 0.6 cm 06/14/2017  9:00 AM  Wound Width (cm) 0.6 cm 06/14/2017  9:00 AM  Wound Depth (cm) 0.1 cm 06/14/2017  9:00 AM  Tunneling (cm) 0 06/14/2017  9:00 AM  Undermining (cm) 0 06/14/2017  9:00 AM  Margins Unattached edges (unapproximated) 06/15/2017  9:43 AM  Drainage Amount Scant 06/15/2017  9:43 AM  Drainage Description Purulent 06/15/2017  9:43 AM  Treatment Debridement (Selective);Hydrotherapy (Pulse lavage);Packing (Saline gauze) 06/15/2017  9:43 AM   Santyl applied to wound bed prior to applying  dressing.    Hydrotherapy Pulsed lavage therapy - wound location: x4 buttock wounds Pulsed Lavage with Suction (psi): 12 psi Pulsed Lavage with Suction - Normal Saline Used: 1000 mL Pulsed Lavage Tip: Tip with splash shield Selective Debridement Selective Debridement - Location: x4 buttock wounds Selective Debridement - Tools Used: Forceps;Scalpel Selective Debridement - Tissue Removed: yellow unviable tissue   Wound Assessment and Plan  Wound Therapy - Assess/Plan/Recommendations Wound Therapy - Clinical Statement: Noted necrotic tissue softer today and more susceptible to sharps debridement. Pt will benefit from continued hydrotherapy for selective removal of unviable tissue, and to promote wound bed healing.  Wound Therapy - Functional Problem List: Decreased tolerance for sitting up OOB Factors Delaying/Impairing Wound Healing: Incontinence;Immobility;Multiple medical problems Hydrotherapy Plan: Debridement;Dressing change;Patient/family education;Pulsatile lavage with suction Wound Therapy - Frequency: 6X / week Wound Therapy - Follow Up Recommendations: Home health RN Wound Plan: See above  Wound Therapy Goals- Improve the function of patient's integumentary system by progressing the wound(s) through the phases  of wound healing (inflammation - proliferation - remodeling) by: Decrease Necrotic Tissue to: 0% Increase Granulation Tissue to: 100% Goals/treatment plan/discharge plan were made with and agreed upon by patient/family: Yes Time For Goal Achievement: 7 days Wound Therapy - Potential for Goals: Good  Goals will be updated until maximal potential achieved or discharge criteria met.  Discharge criteria: when goals achieved, discharge from hospital, MD decision/surgical intervention, no progress towards goals, refusal/missing three consecutive treatments without notification or medical reason.  GP      D  06/15/2017, 9:49 AM    , PT, DPT Acute  Rehabilitation Services Pager: [phone]    

## 2017-06-15 NOTE — Progress Notes (Signed)
[facility] PHYSICAL MEDICINE & REHABILITATION     PROGRESS NOTE  Subjective/Complaints:  Pt seen laying in bed this AM.  She slept well overnight.  When asked about PRS, she states they stopped by yesterday and plan to assess wound daily.   ROS: Denies CP, SOB, N/V.  Objective: Vital Signs: Blood pressure 110/76, pulse (!) 114, temperature 97.9 F (36.6 C), temperature source Oral, resp. rate 17, weight 68.6 kg (151 lb 3.8 oz), SpO2 97 %. No results found.  Recent Labs  06/14/17 1628 06/15/17 0650  WBC 5.0 5.2  HGB 10.1* 11.0*  HCT 33.7* 34.9*  PLT 336 311    Recent Labs  06/12/17 1529 06/14/17 1628  NA 138 132*  K 6.0* 4.7  CL 102 96*  GLUCOSE 106* 78  BUN 40* 29*  CREATININE 4.37* 3.69*  CALCIUM 9.2 8.7*   CBG (last 3)  No results for input(s): GLUCAP in the last 72 hours.  Wt Readings from Last 3 Encounters:  06/14/17 68.6 kg (151 lb 3.8 oz)  06/05/17 72.5 kg (159 lb 13.3 oz)  06/02/17 64.5 kg (142 lb 3.2 oz)    Physical Exam:  BP 110/76 (BP Location: Right Arm)   Pulse (!) 114   Temp 97.9 F (36.6 C) (Oral)   Resp 17   Wt 68.6 kg (151 lb 3.8 oz)   SpO2 97%   BMI 25.17 kg/m  Constitutional: She appears well-developed and well-nourished. No distress.  HENT: Normocephalic and atraumatic.  Eyes: EOMI. No discharge.  Cardiovascular: IRIR.No JVD Respiratory:  Resp: Effort normal and breath sounds normal.  GI: Bowel sounds are normal. She exhibits no distension. Musculoskeletal: She exhibits no edema or tenderness.  Neurological: She is alert and oriented.  Motor: B/l UE: 4+/5 proximal to distal B/l LE: HF 3-/5, KE 3/5, ADF/PF 3+/5 (stable) Skin: Skin is warm and dry. She is not diaphoretic. Right foot with healed abrasions.  LLE with ulcer and dressing clean.   Sacral ulcer, not examined today Psychiatric: She has a normal mood and affect. Her behavior is normal. Judgment and thought content normal.   Assessment/Plan: 1. 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 Physiatrist is providing close team supervision and 24 hour management of active medical problems listed below. Physiatrist and rehab team continue to assess barriers to discharge/monitor patient progress toward functional and medical goals.  Function:  Bathing Bathing position   Position: Bed (LB in bed; UB sitting EOB)  Bathing parts Body parts bathed by patient: Right arm, Left arm, Chest, Abdomen, Front perineal area, Right upper leg, Right lower leg, Left upper leg Body parts bathed by helper: Back, Buttocks  Bathing assist Assist Level: Touching or steadying assistance(Pt > 75%)   Set up : To obtain items  Upper Body Dressing/Undressing Upper body dressing   What is the patient wearing?: Pull over shirt/dress     Pull over shirt/dress - Perfomed by patient: Thread/unthread right sleeve, Thread/unthread left sleeve, Put head through opening Pull over shirt/dress - Perfomed by helper: Pull shirt over trunk        Upper body assist Assist Level:  (Min. A)   Set up : To obtain clothing/put away  Lower Body Dressing/Undressing Lower body dressing   What is the patient wearing?: Pants, Non-skid slipper socks     Pants- Performed by patient: Thread/unthread right pants leg, Thread/unthread left pants leg, Pull pants up/down Pants- Performed by helper:  Thread/unthread left pants leg Non-skid slipper socks- Performed by patient: Don/doff right sock Non-skid slipper socks- Performed by helper: Don/doff left sock                  Lower body assist Assist for lower body dressing: Touching or steadying assistance (Pt > 75%)      Toileting Toileting     Toileting steps completed by helper: Adjust clothing prior to toileting, Performs perineal hygiene, Adjust clothing after toileting    Toileting assist Assist level:  (Total Assist)    Transfers Chair/bed transfer Chair/bed transfer activity did not occur: Safety/medical concerns (incontinent of bowel, time contraints) Chair/bed transfer method: Lateral scoot Chair/bed transfer assist level: 2 helpers Chair/bed transfer assistive device: Armrests     Locomotion Ambulation Ambulation activity did not occur: Safety/medical concerns (incontinent of bowel, time constraints)   Max distance: 5 Assist level: 2 helpers   Oceanographer activity did not occur: Safety/medical concerns (incontinent of bowel; time constraints) Type: Manual Max wheelchair distance: 176ft  Assist Level: Touching or steadying assistance (Pt > 75%)  Cognition Comprehension Comprehension assist level: Understands complex 90% of the time/cues 10% of the time  Expression Expression assist level: Expresses complex 90% of the time/cues < 10% of the time  Social Interaction Social Interaction assist level: Interacts appropriately with others with medication or extra time (anti-anxiety, antidepressant).  Problem Solving Problem solving assist level: Solves basic 90% of the time/requires cueing < 10% of the time  Memory Memory assist level: Recognizes or recalls 75 - 89% of the time/requires cueing 10 - 24% of the time    Medical Problem List and Plan: 1.  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  Cont CIR 2.  DVT Prophylaxis/Anticoagulation: Pharmaceutical: Cont Eliquis  3. Pain Management: tylenol prn  4. Mood: Remains motivated. LCSW to follow for support.  5. Neuropsych: This patient is not fully capable of making decisions on her own behalf. 6. Skin/Wound Care: Maintain adequate nutritional status to help promote healing.   VAC d/ced, spoke with PRS PA, MD ?daily evaluation per pt  Hydrotherapy for sacral ulcer 7. Fluids/Electrolytes/Nutrition: Monitor I/Os. Continue megace to help with appetite. On cardiac diet to help with food choices--add 1200 cc/FR 8.  A fib with RVR: To continue amiodarone at 400 mg bid and metoprolol 50 mg bid.  9. CKD, now dialysis dependent: AVG postponed for now. HD dependent--Tue/Thu/Sat schedule at end of day to help with tolerance of therapy. 10. Hypotension: On amiodarone 10 mg tid.  Relatively controlled 9/7 11. Anemia of chronic disease: IV iron for iron deficiency. On aranesp weekly.  Labs with HD  Hb 11.0 on 9/7 12. Left foot wound: Wound VAC over Acell for healing--dressing changes weekly by plastics.  13. Anxiety/depression: Team to provide ego support.  Continue Seroquel and Zoloft.  14. Constipation: Continue miralax and linzess. Increase senna S to bid.  15. Neutropenia: ?Resolved  WBCs 5.2 on  9/7  Cont to monitor  Consulted Heme/Onc due to neutropenia and previous transvaginal U/S concerning for endometrial CA  CT reviewed, limited, but relatively unremarkable for abdominal pathology  Heme/Onc ordered further workup, appreciate recs.  LOS (Days) 9 A FACE TO FACE EVALUATION WAS PERFORMED  Ankit Lorie Phenix 06/15/2017 8:20 AM

## 2017-06-15 NOTE — Progress Notes (Signed)
Occupational Therapy Session Note  Patient Details  Name: [name] MRN: [id number] Date of Birth: [birth year]  Today's Date: 06/15/2017 OT Individual Time: 0133-0230 OT Individual Time Calculation (min): 57 min    Short Term Goals: Week 2:  OT Short Term Goal 1 (Week 2): STG = LTGs due to remaining LOS  Skilled Therapeutic Interventions/Progress Updates:    Treatment focus on functional transfers and bed mobility in preparation for d/c home. Pt with no pain but c/o fatigue but willing to participate in therapy session. Pt also reported feeling anxious about upcoming d/c. W/c <> therapy mat slide board transfer with min. A and verbal cues for sequencing and proper positioning to promote improvement of transfer. Engaged in sit > partial stand with therapist seated in armchair in front of pt to facilitate anterior weight shift as needed for transfers and to progress towards standing. Pt very fearful with any attempts at standing. Attempted three musketeers technique for sit > stand to decrease fearfulness while facilitating typical transitional movement. Completed supine > side lying with focus on management of LE and trunk control with pt requiring min. A and verbal cues for LE positioning due to persistent muscle weakness. Pt continues to require moderate verbal and tactile cues for sequencing and problem-solving with bed mobility, despite daily repetition. Pt returned to bed end of session with slide board min. A and all needs in reach.  Husband not present for family education. Continue to plan hands on education with husband in preparation for d/c home.  Therapy Documentation Precautions:  Precautions Precautions: Fall Precaution Comments: R IJ perm cath; wound vac LLE; LUE DVT Restrictions Weight Bearing Restrictions: No    See Function Navigator for Current Functional Status.   Therapy/Group: Individual Therapy  Hoyt Koch 06/15/2017, 2:51 PM

## 2017-06-15 NOTE — Progress Notes (Signed)
Social Work Patient ID: Mckenzi Buonomo, female   DOB: Dec 10, 1950, 66 y.o.   MRN: [id number]  Met with pt husband has eben here the past few days to learn her care. Car transfer did not go well today and it will not work in [place], commissioning. Spoke with [facility] to ask if OP HD arranged, they are still working on this and are hoping [place] has a slot for her. She is to get back with this worker regarding her OP HD. Have contacted [facility] regarding transport To HD. Have to know the site first before can be arranged. Have ordered equipment-hospital bed, wheelchair, drop-arm bedside commode and 30 transfer board, to contact husband to deliver bed on Monday. Husband to come in Over the weekend to go through more therapies.

## 2017-06-15 NOTE — Progress Notes (Signed)
Physical Therapy Session Note  Patient Details  Name: [name] MRN: [id number] Date of Birth: 1951/04/07  Today's Date: 06/15/2017 PT Individual Time: 1100-1200 PT Individual Time Calculation (min): 60 min   Short Term Goals: Week 2:  PT Short Term Goal 1 (Week 2): = LTGs  Skilled Therapeutic Interventions/Progress Updates:    Session focused on family education for real car transfer and discharge planning. Educated and problem solved various options as they have a tall SUV ([employer] pilot). Demonstrated that the slideboard was not a safe option and both agreed. Husband able to perform total assist sit -> stand x 2 and maintain standing about 10-15 seconds before pt request to sit back down and refuses/unable to advance feet to attempt the pivot. Discussed current recommendation to either use public transportation (via w/c especially due to HD schedule and pt generally very fatigued and requiring up to total assist) or option to borrow a sedan car (to be able to do a slideboard transfer). Both in agreement that transfer to SUV at this time is not an option and unsafe but they would like to "think about it". Reminded pt that there are only a few days left before planned d/c and need to make arrangements ASAP. Will notify CSW of recommendation at this time.   Before returning to room, pt request to complete therapy outside. Engaged in seated LE therex for functional strengthening including isometric hip abduction and adduction with 10 second hold and LAQ with 5 second each x 10 reps each BLE.   Therapy Documentation Precautions:  Precautions Precautions: Fall Precaution Comments: R IJ perm cath; wound vac LLE; LUE DVT Restrictions Weight Bearing Restrictions: No  Pain:  Denies pain.    See Function Navigator for Current Functional Status.   Therapy/Group: Individual Therapy  Canary Brim Ivory Broad, PT, DPT  06/15/2017, 12:08 PM

## 2017-06-15 NOTE — Progress Notes (Signed)
Social Work Patient ID: [name], female   DOB: [birth year], 66 y.o.   MRN: [id number]  Spoke with [facility] who reports Slot in [place]-HD slot time 6:40 am needs to be there at 6:20 am T, TH & Sat. Need to be there 9/12 between 10-12 to complete paperwork, since new patient. Will inform pt and husband of this.

## 2017-06-15 NOTE — Progress Notes (Signed)
Occupational Therapy Session Note  Patient Details  Name: [name] MRN: [id number] Date of Birth: 14-Nov-1950  Today's Date: 06/15/2017 OT Individual Time: 6553-7482 OT Individual Time Calculation (min): 59 min    Short Term Goals: Week 2:  OT Short Term Goal 1 (Week 2): STG = LTGs due to remaining LOS  Skilled Therapeutic Interventions/Progress Updates:    Treatment focus on self-care, bed mobility, transfers, and family education. Pt reporting no pain but c/o fatigue. Pt performed LB bathing/dressing at bed level with min. A for LE placement to promote independence and lessen burden of care. Pt demonstrated improvement in BLE management requiring less cues for sequencing when washing BLE and donning pants. Pt able to roll supine <> left and right with min. A for steadying to allow pt to pull pants over hips. Pt completed UB bathing/dressing in w/c at sink with supervision/set-up. Pt continues to require extra time and verbal/tactile cues for problem-solving/sequencing for UB dressing. Family education provided to spouse throughout session of self-care tasks, bed mobility, and functional transfers that will promote pt independence/improvement and lessen burden of care. Spouse performed slide board transfer bed > w/c with therapist providing mod cues for technique and appropriate cueing for pt with leans and transfer. Continue hands on family education in preparation for d/c. Pt left in w/c with husband and all needs in reach.  Therapy Documentation Precautions:  Precautions Precautions: Fall Precaution Comments: R IJ perm cath; wound vac LLE; LUE DVT Restrictions Weight Bearing Restrictions: No    See Function Navigator for Current Functional Status.   Therapy/Group: Individual Therapy  Hoyt Koch 06/15/2017, 11:40 AM

## 2017-06-15 NOTE — Progress Notes (Signed)
Hematology follow up note  I have reviewed her recent lab results, her neutropenia has spontaneously resolved now. Folic acid, L97, and methylmalonic acid levels were normal. No further workup is needed. I'll sign off. Her anemia is managed by her nephrologist. No need to f/u with me in future.   Truitt Merle  06/15/2017

## 2017-06-16 LAB — RENAL FUNCTION PANEL
ALBUMIN: 2.5 g/dL — AB (ref 3.5–5.0)
ANION GAP: 13 (ref 5–15)
BUN: 19 mg/dL (ref 6–20)
CALCIUM: 8.5 mg/dL — AB (ref 8.9–10.3)
CO2: 26 mmol/L (ref 22–32)
Chloride: 96 mmol/L — ABNORMAL LOW (ref 101–111)
Creatinine, Ser: 3.3 mg/dL — ABNORMAL HIGH (ref 0.44–1.00)
GFR, EST AFRICAN AMERICAN: 16 mL/min — AB (ref >60)
GFR, EST NON AFRICAN AMERICAN: 14 mL/min — AB (ref >60)
Glucose, Bld: 74 mg/dL (ref 65–99)
PHOSPHORUS: 2.5 mg/dL (ref 2.5–4.6)
Potassium: 3.4 mmol/L — ABNORMAL LOW (ref 3.5–5.1)
SODIUM: 135 mmol/L (ref 135–145)

## 2017-06-16 LAB — CBC
HCT: 34.2 % — ABNORMAL LOW (ref 36.0–46.0)
HEMOGLOBIN: 10.4 g/dL — AB (ref 12.0–15.0)
MCH: 33.3 pg (ref 26.0–34.0)
MCHC: 30.4 g/dL (ref 30.0–36.0)
MCV: 109.6 fL — ABNORMAL HIGH (ref 78.0–100.0)
PLATELETS: 312 10*3/uL (ref 150–400)
RBC: 3.12 MIL/uL — AB (ref 3.87–5.11)
RDW: 22.1 % — ABNORMAL HIGH (ref 11.5–15.5)
WBC: 8 10*3/uL (ref 4.0–10.5)

## 2017-06-16 LAB — GLUCOSE, CAPILLARY: GLUCOSE-CAPILLARY: 88 mg/dL (ref 65–99)

## 2017-06-16 MED ORDER — HEPARIN SODIUM (PORCINE) 1000 UNIT/ML DIALYSIS: 1000.0000 [IU] | INTRAMUSCULAR | Status: DC | PRN

## 2017-06-16 MED ORDER — SODIUM CHLORIDE 0.9 % IV SOLN: 100.0000 mL | INTRAVENOUS | Status: DC | PRN

## 2017-06-16 MED ORDER — HEPARIN SODIUM (PORCINE) 1000 UNIT/ML DIALYSIS: 20.0000 [IU]/kg | INTRAMUSCULAR | Status: DC | PRN

## 2017-06-16 MED ORDER — NYSTATIN 100000 UNIT/ML MT SUSP
5.0000 mL | Freq: Four times a day (QID) | OROMUCOSAL | Status: DC
  Administered 2017-06-16 – 2017-06-19 (×6): 500000 [IU] via ORAL
  Filled 2017-06-16 (×9): qty 5

## 2017-06-16 MED ORDER — LIDOCAINE HCL (PF) 1 % IJ SOLN: 5.0000 mL | INTRAMUSCULAR | Status: DC | PRN

## 2017-06-16 MED ORDER — ALTEPLASE 2 MG IJ SOLR: 2.0000 mg | Freq: Once | INTRAMUSCULAR | Status: DC | PRN

## 2017-06-16 MED ORDER — MIDODRINE HCL 5 MG PO TABS
ORAL_TABLET | ORAL | Status: AC
  Administered 2017-06-16: 10 mg via ORAL
  Filled 2017-06-16: qty 2

## 2017-06-16 MED ORDER — PENTAFLUOROPROP-TETRAFLUOROETH EX AERO: 1.0000 "application " | INHALATION_SPRAY | CUTANEOUS | Status: DC | PRN

## 2017-06-16 MED ORDER — LIDOCAINE-PRILOCAINE 2.5-2.5 % EX CREA: 1.0000 "application " | TOPICAL_CREAM | CUTANEOUS | Status: DC | PRN

## 2017-06-16 MED ORDER — NYSTATIN 100000 UNIT/ML MT SUSP: 5.0000 mL | Freq: Four times a day (QID) | OROMUCOSAL | 0 refills | Status: DC

## 2017-06-16 NOTE — Progress Notes (Unsigned)
06/16/17.  [name] is a 66 y.o. 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  Past Medical History:  Diagnosis Date  . A-fib ([place])    on pradaxa  . Chronic kidney disease   . Dysrhythmia   . Hypertension   . Hypothyroidism   . Thyroid disease     Subjective: No new complaints. No new problems.HD later today.  Objective: Vital signs in last 24 hours:      Intake/Output from previous day: 09/07 0701 - 09/08 0700 In: 340 [P.O.:340] Out: -  Last cbgs: CBG (last 3)  No results for input(s): GLUCAP in the last 72 hours.   Physical Exam General: No apparent distress   HEENT: THRUSH Lungs: Normal effort. Lungs clear to auscultation, no crackles or wheezes. Cardiovascular: Irregular rate and rhythm, no edema Abdomen: S/NT/ND; BS(+) Musculoskeletal:  unchanged Neurological: No new neurological deficits General weakness Wounds: N/A    Skin: clear  Aging changes Mental state: Alert, oriented, cooperative    Lab Results: BMET    Component Value Date/Time   NA 135 06/16/2017 0839   NA 140 09/19/2014 0621   K 3.4 (L) 06/16/2017 0839   K 4.1 09/19/2014 0621   CL 96 (L) 06/16/2017 0839   CL 109 (H) 09/19/2014 0621   CO2 26 06/16/2017 0839   CO2 23 09/19/2014 0621   GLUCOSE 74 06/16/2017 0839   GLUCOSE 99 09/19/2014 0621   BUN 19 06/16/2017 0839   BUN 10 09/19/2014 0621   CREATININE 3.30 (H) 06/16/2017 0839   CREATININE 0.62 09/19/2014 0621   CALCIUM 8.5 (L) 06/16/2017 0839   CALCIUM 8.7 05/29/2017 1616   GFRNONAA 14 (L) 06/16/2017 0839   GFRNONAA >60 09/19/2014 0621   GFRAA 16 (L) 06/16/2017 0839   GFRAA >60 09/19/2014 0621   CBC    Component Value Date/Time   WBC 8.0 06/16/2017 0830   RBC 3.12 (L) 06/16/2017 0830   HGB 10.4 (L) 06/16/2017 0830   HGB 14.4 09/19/2014 0621   HCT 34.2 (L) 06/16/2017 0830   HCT 33.0 (L) 06/13/2017 0601   PLT 312 06/16/2017 0830   PLT 225 09/19/2014 0621   MCV 109.6 (H) 06/16/2017 0830   MCV 98  09/19/2014 0621   MCH 33.3 06/16/2017 0830   MCHC 30.4 06/16/2017 0830   RDW 22.1 (H) 06/16/2017 0830   RDW 12.6 09/19/2014 0621   LYMPHSABS 1.3 06/15/2017 0650   LYMPHSABS 2.4 09/19/2014 0621   MONOABS 1.8 (H) 06/15/2017 0650   MONOABS 0.6 09/19/2014 0621   EOSABS 0.0 06/15/2017 0650   EOSABS 0.1 09/19/2014 0621   BASOSABS 0.1 06/15/2017 0650   BASOSABS 0.0 09/19/2014 0621    Medications: I have reviewed the patient's current medications.  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  Will treat with mycostatin CKD. Contine HD. Renal f/u A Fib. Controlled VR Multiple wounds. Continue aggressive local wound care    Length of stay, days: 0  Nyoka Cowden , MD 06/16/2017, 11:17 AM

## 2017-06-16 NOTE — Progress Notes (Signed)
Physical Therapy Wound Treatment Patient Details  Name: [name] MRN: [id number] Date of Birth: [birth year]  Today's Date: 06/16/2017 PT Individual Time: 3295-1884 PT Individual Time Calculation (min): 32 min   Subjective  Subjective: Pt pleasant and agreeable to hydrotherapy Patient and Family Stated Goals: To heal wounds  Pain Score:  "minimal"  -  Patient was premedicated  Wound Assessment  Pressure Injury 06/03/17 Unstageable - Full thickness tissue loss in which the base of the ulcer is covered by slough (yellow, tan, gray, green or brown) and/or eschar (tan, brown or black) in the wound bed. yellow slough in area on rt buttocks (Active)  Dressing Type ABD;Barrier Film (skin prep);Gauze (Comment);Moist to dry;Paper tape 06/16/2017  9:20 PM  Dressing Changed 06/16/2017  9:20 PM  Dressing Change Frequency Daily 06/16/2017  9:20 PM  State of Healing Eschar 06/16/2017  9:20 PM  Site / Wound Assessment Yellow 06/16/2017  9:20 PM  % Wound base Red or Granulating 0% 06/16/2017  9:20 PM  % Wound base Yellow/Fibrinous Exudate 100% 06/16/2017  9:20 PM  % Wound base Black/Eschar 0% 06/16/2017  9:20 PM  % Wound base Other/Granulation Tissue (Comment) 0% 06/16/2017  9:20 PM  Peri-wound Assessment Intact;Pink 06/16/2017  9:20 PM  Wound Length (cm) 0.2 cm 06/14/2017  9:00 AM  Wound Width (cm) 0.4 cm 06/14/2017  9:00 AM  Wound Depth (cm) 0.1 cm 06/14/2017  9:00 AM  Tunneling (cm) 0 06/14/2017  9:00 AM  Undermining (cm) 0 06/14/2017  9:00 AM  Margins Unattached edges (unapproximated) 06/16/2017  9:20 PM  Drainage Amount Scant 06/16/2017  9:20 PM  Drainage Description Purulent;No odor 06/16/2017  9:20 PM  Treatment Debridement (Selective);Hydrotherapy (Pulse lavage) 06/16/2017  9:20 PM        Pressure Injury 06/12/17 Unstageable - Full thickness tissue loss in which the base of the ulcer is covered by slough (yellow, tan, gray, green or brown) and/or eschar (tan, brown or black) in the wound bed. slough covered area on left  buttocks (Active)  Dressing Type ABD;Barrier Film (skin prep);Gauze (Comment);Moist to dry;Paper tape 06/16/2017  9:20 PM  Dressing Changed 06/16/2017  9:20 PM  Dressing Change Frequency Daily 06/16/2017  9:20 PM  State of Healing Eschar 06/16/2017  9:20 PM  Site / Wound Assessment Yellow 06/16/2017  9:20 PM  % Wound base Red or Granulating 0% 06/16/2017  9:20 PM  % Wound base Yellow/Fibrinous Exudate 100% 06/16/2017  9:20 PM  % Wound base Black/Eschar 0% 06/16/2017  9:20 PM  % Wound base Other/Granulation Tissue (Comment) 0% 06/16/2017  9:20 PM  Peri-wound Assessment Intact;Pink 06/16/2017  9:20 PM  Wound Length (cm) 0.5 cm 06/14/2017  9:00 AM  Wound Width (cm) 1.5 cm 06/14/2017  9:00 AM  Wound Depth (cm) 0.1 cm 06/14/2017  9:00 AM  Tunneling (cm) 0 06/14/2017  9:00 AM  Undermining (cm) 0 06/14/2017  9:00 AM  Margins Unattached edges (unapproximated) 06/16/2017  9:20 PM  Drainage Amount Scant 06/16/2017  9:20 PM  Drainage Description No odor;Purulent 06/16/2017  9:20 PM  Treatment Debridement (Selective);Hydrotherapy (Pulse lavage) 06/16/2017  9:20 PM     Pressure Injury 06/14/17 Unstageable - Full thickness tissue loss in which the base of the ulcer is covered by slough (yellow, tan, gray, green or brown) and/or eschar (tan, brown or black) in the wound bed. (Active)  Dressing Type ABD;Barrier Film (skin prep);Gauze (Comment);Moist to dry;Paper tape 06/16/2017  9:20 PM  Dressing Changed 06/16/2017  9:20 PM  Dressing Change Frequency Daily 06/16/2017  9:20 PM  State of Healing Non-healing 06/16/2017  9:20 PM  Site / Wound Assessment Yellow;Brown;Pink 06/16/2017  9:20 PM  % Wound base Red or Granulating 30% 06/16/2017  9:20 PM  % Wound base Yellow/Fibrinous Exudate 60% 06/16/2017  9:20 PM  % Wound base Black/Eschar 10% 06/16/2017  9:20 PM  % Wound base Other/Granulation Tissue (Comment) 0% 06/16/2017  9:20 PM  Peri-wound Assessment Intact;Pink 06/16/2017  9:20 PM  Wound Length (cm) 3.2 cm 06/14/2017  9:00 AM  Wound Width (cm) 2 cm 06/14/2017   9:00 AM  Wound Depth (cm) 0.1 cm 06/14/2017  9:00 AM  Tunneling (cm) 0 06/14/2017  9:00 AM  Undermining (cm) 0 06/14/2017  9:00 AM  Margins Unattached edges (unapproximated) 06/16/2017  9:20 PM  Drainage Amount Scant 06/16/2017  9:20 PM  Drainage Description Purulent;No odor 06/16/2017  9:20 PM  Treatment Debridement (Selective);Hydrotherapy (Pulse lavage) 06/16/2017  9:20 PM     Pressure Injury 06/14/17 Unstageable - Full thickness tissue loss in which the base of the ulcer is covered by slough (yellow, tan, gray, green or brown) and/or eschar (tan, brown or black) in the wound bed. (Active)  Dressing Type ABD;Barrier Film (skin prep);Gauze (Comment);Moist to dry;Paper tape 06/16/2017  9:20 PM  Dressing Changed 06/16/2017  9:20 PM  Dressing Change Frequency Daily 06/16/2017  9:20 PM  State of Healing Eschar 06/16/2017  9:20 PM  Site / Wound Assessment Yellow 06/16/2017  9:20 PM  % Wound base Red or Granulating 0% 06/16/2017  9:20 PM  % Wound base Yellow/Fibrinous Exudate 100% 06/16/2017  9:20 PM  % Wound base Black/Eschar 0% 06/16/2017  9:20 PM  % Wound base Other/Granulation Tissue (Comment) 0% 06/16/2017  9:20 PM  Peri-wound Assessment Intact;Pink 06/16/2017  9:20 PM  Wound Length (cm) 0.6 cm 06/14/2017  9:00 AM  Wound Width (cm) 0.6 cm 06/14/2017  9:00 AM  Wound Depth (cm) 0.1 cm 06/14/2017  9:00 AM  Tunneling (cm) 0 06/14/2017  9:00 AM  Undermining (cm) 0 06/14/2017  9:00 AM  Margins Unattached edges (unapproximated) 06/16/2017  9:20 PM  Drainage Amount Scant 06/16/2017  9:20 PM  Drainage Description Purulent;No odor 06/16/2017  9:20 PM  Treatment Debridement (Selective);Hydrotherapy (Pulse lavage) 06/16/2017  9:20 PM      Hydrotherapy Pulsed lavage therapy - wound location: x4 buttock wounds Pulsed Lavage with Suction (psi): 8 psi (8-12 psi) Pulsed Lavage with Suction - Normal Saline Used: 1000 mL Pulsed Lavage Tip: Tip with splash shield Selective Debridement Selective Debridement - Location: x4 buttock wounds Selective  Debridement - Tools Used: Forceps;Scalpel Selective Debridement - Tissue Removed: black and yellow nonviable tissue   Wound Assessment and Plan  Wound Therapy - Assess/Plan/Recommendations Wound Therapy - Clinical Statement: Able to remove black eschar on larger wound today. Pt will benefit from continued hydrotherapy for selective removal of unviable tissue, and to promote wound bed healing.  Wound Therapy - Functional Problem List: Decreased tolerance for sitting up OOB Factors Delaying/Impairing Wound Healing: Incontinence;Immobility;Multiple medical problems Hydrotherapy Plan: Debridement;Dressing change;Patient/family education;Pulsatile lavage with suction Wound Therapy - Frequency: 6X / week Wound Therapy - Follow Up Recommendations: Home health RN Wound Plan: See above  Wound Therapy Goals- Improve the function of patient's integumentary system by progressing the wound(s) through the phases of wound healing (inflammation - proliferation - remodeling) by: Decrease Necrotic Tissue to: 0% Decrease Necrotic Tissue - Progress: Progressing toward goal Increase Granulation Tissue to: 100% Increase Granulation Tissue - Progress: Progressing toward goal Goals/treatment plan/discharge plan were made with  and agreed upon by patient/family: Yes Time For Goal Achievement: 7 days Wound Therapy - Potential for Goals: Good  Goals will be updated until maximal potential achieved or discharge criteria met.  Discharge criteria: when goals achieved, discharge from hospital, MD decision/surgical intervention, no progress towards goals, refusal/missing three consecutive treatments without notification or medical reason.  GP     Despina Pole 06/16/2017, 9:28 PM Carita Pian. Sanjuana Kava, [place] Pager [phone]

## 2017-06-16 NOTE — Progress Notes (Signed)
Dressing change to left lower leg completed per order.

## 2017-06-16 NOTE — Procedures (Signed)
Tol HD Goal 3 liters (2.5) via TDC. Soft BP but stable; trying to get weight down. [name] C

## 2017-06-17 ENCOUNTER — Encounter (HOSPITAL_COMMUNITY): Payer: Medicare Other

## 2017-06-17 NOTE — Progress Notes (Signed)
Occupational Therapy Session Note  Patient Details  Name: Latrease Kunde MRN: [id number] Date of Birth: April 14, 1951  Today's Date: 06/17/2017 OT Individual Time: 5615-3794 OT Individual Time Calculation (min): 43 min    Short Term Goals: Week 2:  OT Short Term Goal 1 (Week 2): STG = LTGs due to remaining LOS  Skilled Therapeutic Interventions/Progress Updates:    1:1. Family no present for family training session. Upon entering room pt requesting to eat breakfast first lying down. Encouraged pt to sit EOB. Despite practice daily, pt requires Vc to sequencing transition to EOB and A to management legs and/trunk. Pt sits EOB with question cues to problem solve finding opening to cereal package and ultimately requiring A to start opening package 2/2 grip weakness. Pt eats EOB with supervision for sitting balance and tactile cues for posture. Pt threads BLE into pants at EOB with reacher with tactile cues and VC for technique. Pt leans laterally and assist OT to advance pants past hips with mod A. Pt completes slide board transfer with question cues for sequencing and safety awareness of transfer EOB<>BSC with MIN A for balance. Exited session with pt semi reclined in bed with call light inreach and all needs met.   Therapy Documentation Precautions:  Precautions Precautions: Fall Precaution Comments: R IJ perm cath; wound vac LLE; LUE DVT Restrictions Weight Bearing Restrictions: No  See Function Navigator for Current Functional Status.   Therapy/Group: Individual Therapy  Tonny Branch 06/17/2017, 9:41 AM

## 2017-06-17 NOTE — Progress Notes (Signed)
Assessment/ Plan:   1. Chronic systolic congestive heart failure/dilated cardiomyopathy: Volume improved, BP down with HD 2. ESRD:  Tuesday/Thursday/Saturday schedule.  Permanent access placement deferred to out-patient when clinically more stable.  3. Anemia:continue intravenous iron/ESA  4. Atrial fibrillation with RVR: amiodarone and metoprolol 5. Hypotension:On midodrine, monitor with ultrafiltration and hemodialysis 6.Hypokalemia: recheck  Subjective: Interval History: Tol HD yest.  [place] [facility] HD on TTS  Objective: Vital signs in last 24 hours: Temp:  [97.8 F (36.6 C)-98 F (36.7 C)] 98 F (36.7 C) (09/09 1446) Pulse Rate:  [54-91] 54 (09/09 1446) Resp:  [18] 18 (09/09 1446) BP: (96-105)/(67-75) 103/72 (09/09 1446) SpO2:  [99 %-100 %] 99 % (09/09 1446) Weight:  [70 kg (154 lb 5.2 oz)] 70 kg (154 lb 5.2 oz) (09/09 0456) Weight change:   Intake/Output from previous day: 09/08 0701 - 09/09 0700 In: 180 [P.O.:180] Out: -  Intake/Output this shift: No intake/output data recorded.  Resp: clear to auscultation bilaterally Cardio: regular rate and rhythm, S1, S2 normal, no murmur, click, rub or gallop  TDC Ext 1+ edema  Lab Results:  Recent Labs  06/15/17 0650 06/16/17 0830  WBC 5.2 8.0  HGB 11.0* 10.4*  HCT 34.9* 34.2*  PLT 311 312   BMET:  Recent Labs  06/14/17 1628 06/16/17 0839  NA 132* 135  K 4.7 3.4*  CL 96* 96*  CO2 23 26  GLUCOSE 78 74  BUN 29* 19  CREATININE 3.69* 3.30*  CALCIUM 8.7* 8.5*   No results for input(s): PTH in the last 72 hours. Iron Studies: No results for input(s): IRON, TIBC, TRANSFERRIN, FERRITIN in the last 72 hours. Studies/Results: No results found.  Scheduled: . amiodarone  400 mg Oral BID  . apixaban  5 mg Oral BID  . collagenase   Topical Daily  . darbepoetin (ARANESP) injection - DIALYSIS  60 mcg Intravenous Q Thu-HD  . famotidine  20 mg Oral QHS  . feeding supplement (NEPRO CARB STEADY)  237 mL Oral BID BM  .  hydrocerin   Topical BID  . levothyroxine  100 mcg Oral QAC breakfast  . megestrol  40 mg Oral BID  . metoprolol tartrate  50 mg Oral BID  . midodrine  10 mg Oral TID WC  . multivitamin  1 tablet Oral QHS  . nystatin  5 mL Oral QID  . polyethylene glycol  17 g Oral BID  . QUEtiapine  25 mg Oral QHS  . senna-docusate  2 tablet Oral BID  . sertraline  25 mg Oral QHS  . sucroferric oxyhydroxide  500 mg Oral TID WC    LOS: 11 days   [name] C 06/17/2017,4:04 PM

## 2017-06-17 NOTE — Progress Notes (Signed)
Patient ID: [name], female   DOB: 16-Nov-1950, 66 y.o.   MRN: [id number]   06/17/17.  [name] is a 66 y.o. 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   Past Medical History:  Diagnosis Date  . A-fib ([place])    on pradaxa  . Chronic kidney disease   . Dysrhythmia   . Hypertension   . Hypothyroidism   . Thyroid disease      Subjective: No new complaints. No new problems. HD yesterday.  Started on mycostatin for thrush.  Objective: Vital signs in last 24 hours: Temp:  [97.8 F (36.6 C)-98.4 F (36.9 C)] 97.8 F (36.6 C) (09/09 0456) Pulse Rate:  [57-105] 57 (09/09 0456) Resp:  [18] 18 (09/09 0456) BP: (44-105)/(21-71) 96/67 (09/09 0456) SpO2:  [100 %] 100 % (09/09 0456) Weight:  [154 lb 5.2 oz (70 kg)] 154 lb 5.2 oz (70 kg) (09/09 0456) Weight change:  Last BM Date: 06/15/17  Intake/Output from previous day: 09/08 0701 - 09/09 0700 In: 180 [P.O.:180] Out: -  Last cbgs: CBG (last 3)   Recent Labs  06/16/17 2114  GLUCAP 88   BP Readings from Last 3 Encounters:  06/17/17 101/75  06/06/17 107/76  06/03/17 99/71    Physical Exam General: No apparent distress  Weak HEENT: scattered exudates of tongue and oropharynx c/w thrush Lungs: Normal effort. Lungs clear to auscultation, no crackles or wheezes. Cardiovascular: Regular rate and rhythm, no edema Abdomen: S/NT/ND; BS(+) Musculoskeletal:  unchanged Neurological: No new neurological deficits Wounds: N/A    Skin: clear   Mental state: Alert, oriented, cooperative    Lab Results: BMET    Component Value Date/Time   NA 135 06/16/2017 0839   NA 140 09/19/2014 0621   K 3.4 (L) 06/16/2017 0839   K 4.1 09/19/2014 0621   CL 96 (L) 06/16/2017 0839   CL 109 (H) 09/19/2014 0621   CO2 26 06/16/2017 0839   CO2 23 09/19/2014 0621   GLUCOSE 74 06/16/2017 0839   GLUCOSE 99 09/19/2014 0621   BUN 19 06/16/2017 0839   BUN 10 09/19/2014 0621   CREATININE 3.30 (H) 06/16/2017 0839   CREATININE 0.62 09/19/2014 0621   CALCIUM 8.5 (L) 06/16/2017 0839   CALCIUM 8.7 05/29/2017 1616   GFRNONAA 14 (L) 06/16/2017 0839   GFRNONAA >60 09/19/2014 0621   GFRAA 16 (L) 06/16/2017 0839   GFRAA >60 09/19/2014 0621   CBC    Component Value Date/Time   WBC 8.0 06/16/2017 0830   RBC 3.12 (L) 06/16/2017 0830   HGB 10.4 (L) 06/16/2017 0830   HGB 14.4 09/19/2014 0621   HCT 34.2 (L) 06/16/2017 0830   HCT 33.0 (L) 06/13/2017 0601   PLT 312 06/16/2017 0830   PLT 225 09/19/2014 0621   MCV 109.6 (H) 06/16/2017 0830   MCV 98 09/19/2014 0621   MCH 33.3 06/16/2017 0830   MCHC 30.4 06/16/2017 0830   RDW 22.1 (H) 06/16/2017 0830   RDW 12.6 09/19/2014 0621   LYMPHSABS 1.3 06/15/2017 0650   LYMPHSABS 2.4 09/19/2014 0621   MONOABS 1.8 (H) 06/15/2017 0650   MONOABS 0.6 09/19/2014 0621   EOSABS 0.0 06/15/2017 0650   EOSABS 0.1 09/19/2014 0621   BASOSABS 0.1 06/15/2017 0650   BASOSABS 0.0 09/19/2014 0621     Medications: I have reviewed the patient's current medications.  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  Will continue treatment with mycostatin CKD. Contine HD. Renal f/u A Fib. Controlled  VR Continue pradaxa Multiple wounds. Continue aggressive local wound care HTN- good control   Length of stay, days: [place] , MD 06/17/2017, 8:50 AM

## 2017-06-17 NOTE — Progress Notes (Signed)
Patient ID: [name], female   DOB: 1950-10-12, 66 y.o.   MRN: [id number]    Marletta Lor, MD  Internal Medicine  Expand All Collapse All   [] Hide copied text 06/16/17.  [name] is a 66 y.o. 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      Past Medical History:  Diagnosis Date  . A-fib ([place])    on pradaxa  . Chronic kidney disease   . Dysrhythmia   . Hypertension   . Hypothyroidism   . Thyroid disease     Subjective: No new complaints. No new problems.HD later today.  Objective: Vital signs in last 24 hours:    Intake/Output from previous day: 09/07 0701 - 09/08 0700 In: 340 [P.O.:340] Out: -  Last cbgs: CBG (last 3)  Recent Labs (last 2 labs)   No results for input(s): GLUCAP in the last 72 hours.     Physical Exam General: No apparent distress   HEENT: THRUSH Lungs: Normal effort. Lungs clear to auscultation, no crackles or wheezes. Cardiovascular: Irregular rate and rhythm, no edema Abdomen: S/NT/ND; BS(+) Musculoskeletal:  unchanged Neurological: No new neurological deficits General weakness Wounds: N/A    Skin: clear  Aging changes Mental state: Alert, oriented, cooperative    Lab Results: BMET Labs (Brief)          Component Value Date/Time   NA 135 06/16/2017 0839   NA 140 09/19/2014 0621   K 3.4 (L) 06/16/2017 0839   K 4.1 09/19/2014 0621   CL 96 (L) 06/16/2017 0839   CL 109 (H) 09/19/2014 0621   CO2 26 06/16/2017 0839   CO2 23 09/19/2014 0621   GLUCOSE 74 06/16/2017 0839   GLUCOSE 99 09/19/2014 0621   BUN 19 06/16/2017 0839   BUN 10 09/19/2014 0621   CREATININE 3.30 (H) 06/16/2017 0839   CREATININE 0.62 09/19/2014 0621   CALCIUM 8.5 (L) 06/16/2017 0839   CALCIUM 8.7 05/29/2017 1616   GFRNONAA 14 (L) 06/16/2017 0839   GFRNONAA >60 09/19/2014 0621   GFRAA 16 (L) 06/16/2017 0839   GFRAA >60 09/19/2014 0621     CBC Labs (Brief)          Component Value  Date/Time   WBC 8.0 06/16/2017 0830   RBC 3.12 (L) 06/16/2017 0830   HGB 10.4 (L) 06/16/2017 0830   HGB 14.4 09/19/2014 0621   HCT 34.2 (L) 06/16/2017 0830   HCT 33.0 (L) 06/13/2017 0601   PLT 312 06/16/2017 0830   PLT 225 09/19/2014 0621   MCV 109.6 (H) 06/16/2017 0830   MCV 98 09/19/2014 0621   MCH 33.3 06/16/2017 0830   MCHC 30.4 06/16/2017 0830   RDW 22.1 (H) 06/16/2017 0830   RDW 12.6 09/19/2014 0621   LYMPHSABS 1.3 06/15/2017 0650   LYMPHSABS 2.4 09/19/2014 0621   MONOABS 1.8 (H) 06/15/2017 0650   MONOABS 0.6 09/19/2014 0621   EOSABS 0.0 06/15/2017 0650   EOSABS 0.1 09/19/2014 0621   BASOSABS 0.1 06/15/2017 0650   BASOSABS 0.0 09/19/2014 0621      Medications: I have reviewed the patient's current medications.  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  Will treat with mycostatin CKD. Contine HD. Renal f/u A Fib. Controlled VR Multiple wounds. Continue aggressive local wound care    Length of stay, days: 0  Nyoka Cowden , MD 06/16/2017, 11:17 AM

## 2017-06-18 ENCOUNTER — Inpatient Hospital Stay (HOSPITAL_COMMUNITY): Payer: Medicare Other

## 2017-06-18 ENCOUNTER — Inpatient Hospital Stay (HOSPITAL_COMMUNITY): Payer: Medicare Other | Admitting: Occupational Therapy

## 2017-06-18 ENCOUNTER — Encounter: Payer: Self-pay | Admitting: Hematology

## 2017-06-18 ENCOUNTER — Inpatient Hospital Stay (HOSPITAL_COMMUNITY): Payer: Medicare Other | Admitting: Physical Therapy

## 2017-06-18 DIAGNOSIS — L98429 Non-pressure chronic ulcer of back with unspecified severity: Secondary | ICD-10-CM

## 2017-06-18 MED ORDER — LIDOCAINE HCL 4 % EX SOLN
Freq: Once | CUTANEOUS | Status: AC
  Administered 2017-06-18: 13:00:00 via TOPICAL
  Filled 2017-06-18: qty 50

## 2017-06-18 NOTE — Progress Notes (Signed)
Assessment/ Plan:   1. Chronic systolic congestive heart failure/dilated cardiomyopathy: Volume improved, BP down with HD 2. ESRD:  Tuesday/Thursday/Saturday schedule.  Permanent access placement deferred to out-patient when clinically more stable.  No issues with R IJ TDC currently 3. Anemia:continue intravenous iron/ESA  4. Atrial fibrillation with RVR: amiodarone and metoprolol 5. Hypotension:On midodrine, monitor with ultrafiltration and hemodialysis 6.Hypokalemia: recheck  Subjective: Interval History: No complaints.  [place] [facility] HD on TTS- CLIP complete  Objective: Vital signs in last 24 hours: Temp:  [98.1 F (36.7 C)] 98.1 F (36.7 C) (09/10 0500) Pulse Rate:  [56] 56 (09/10 0500) Resp:  [18] 18 (09/10 0500) BP: (107-110)/(71-74) 107/71 (09/10 0500) SpO2:  [99 %] 99 % (09/10 0500) Weight:  [69.4 kg (153 lb)] 69.4 kg (153 lb) (09/10 0500) Weight change: 0.8 kg (1 lb 12.2 oz)  Intake/Output from previous day: 09/09 0701 - 09/10 0700 In: 600 [P.O.:600] Out: -  Intake/Output this shift: Total I/O In: 220 [P.O.:220] Out: -   Resp: clear to auscultation bilaterally Cardio: regular rate and rhythm, S1, S2 normal, no murmur, click, rub or gallop  R IJ TDC Ext 1+ edema  Lab Results:  Recent Labs  06/16/17 0830  WBC 8.0  HGB 10.4*  HCT 34.2*  PLT 312   BMET:   Recent Labs  06/16/17 0839  NA 135  K 3.4*  CL 96*  CO2 26  GLUCOSE 74  BUN 19  CREATININE 3.30*  CALCIUM 8.5*   No results for input(s): PTH in the last 72 hours. Iron Studies: No results for input(s): IRON, TIBC, TRANSFERRIN, FERRITIN in the last 72 hours. Studies/Results: No results found.  Scheduled: . amiodarone  400 mg Oral BID  . apixaban  5 mg Oral BID  . collagenase   Topical Daily  . darbepoetin (ARANESP) injection - DIALYSIS  60 mcg Intravenous Q Thu-HD  . famotidine  20 mg Oral QHS  . feeding supplement (NEPRO CARB STEADY)  237 mL Oral BID BM  . hydrocerin   Topical BID  .  levothyroxine  100 mcg Oral QAC breakfast  . megestrol  40 mg Oral BID  . metoprolol tartrate  50 mg Oral BID  . midodrine  10 mg Oral TID WC  . multivitamin  1 tablet Oral QHS  . nystatin  5 mL Oral QID  . polyethylene glycol  17 g Oral BID  . QUEtiapine  25 mg Oral QHS  . senna-docusate  2 tablet Oral BID  . sertraline  25 mg Oral QHS  . sucroferric oxyhydroxide  500 mg Oral TID WC    LOS: 12 days   Thea Holshouser 06/18/2017,3:36 PM

## 2017-06-18 NOTE — Progress Notes (Signed)
Occupational Therapy Session Note  Patient Details  Name: [name] MRN: [id number] Date of Birth: [birth year]  Today's Date: 06/18/2017 OT Individual Time: 2330-0762 OT Individual Time Calculation (min): 59 min    Short Term Goals: Week 2:  OT Short Term Goal 1 (Week 2): STG = LTGs due to remaining LOS  Skilled Therapeutic Interventions/Progress Updates:    Pt seen to focus on family education of self-care tasks/ADLs, bed mobility, and functional transfers in preparation for d/c. Pt with no c/o of pain but reporting fatigue. Spouse present for family ed session. Therapist discussed self-care tasks with spouse and benefits of equipment, pt independence, and providing assistance when appropriate. Spouse demonstrated confidence in pt care. Pt completed LB bathing supine and dressing sitting in bed with assistance provided from spouse. Therapist provided instructional cueing for both pt and spouse throughout session to promote carryover and increase pt participation in preparation for d/c. Spouse completed slide board transfers bed <> w/c and bed <> drop arm commode with min instructional cues provided by therapist for safety and technique demonstrating improvement with repetition. Prior to end of session, therapist discussed importance of spouse being present for family ed sessions throughout the day. Pt left in w/c with spouse and all needs met.  Therapy Documentation Precautions:  Precautions Precautions: Fall Precaution Comments: R IJ perm cath; wound vac LLE; LUE DVT; wounds buttocks Restrictions Weight Bearing Restrictions: No General:   Vital Signs:  Pain: Pain Assessment Pain Assessment: No/denies pain ADL:   Vision Baseline Vision/History: Wears glasses Wears Glasses: At all times Patient Visual Report: No change from baseline Vision Assessment?: No apparent visual deficits Perception  Perception: Within Functional Limits Praxis Praxis: Intact  See Function Navigator  for Current Functional Status.   Therapy/Group: Individual Therapy  Hoyt Koch 06/18/2017, 12:11 PM

## 2017-06-18 NOTE — Progress Notes (Signed)
[place] PHYSICAL MEDICINE & REHABILITATION     PROGRESS NOTE  Subjective/Complaints:  Pt seen laying in bed this AM.  She slept well overnight and had a good weekend.  She notes thrush which is improving.    ROS: Denies CP, SOB, N/V.  Objective: Vital Signs: Blood pressure 107/71, pulse (!) 56, temperature 98.1 F (36.7 C), temperature source Oral, resp. rate 18, weight 69.4 kg (153 lb), SpO2 99 %. No results found.  Recent Labs  06/16/17 0830  WBC 8.0  HGB 10.4*  HCT 34.2*  PLT 312    Recent Labs  06/16/17 0839  NA 135  K 3.4*  CL 96*  GLUCOSE 74  BUN 19  CREATININE 3.30*  CALCIUM 8.5*   CBG (last 3)   Recent Labs  06/16/17 2114  GLUCAP 88    Wt Readings from Last 3 Encounters:  06/18/17 69.4 kg (153 lb)  06/05/17 72.5 kg (159 lb 13.3 oz)  06/02/17 64.5 kg (142 lb 3.2 oz)    Physical Exam:  BP 107/71 (BP Location: Right Arm)   Pulse (!) 56   Temp 98.1 F (36.7 C) (Oral)   Resp 18   Wt 69.4 kg (153 lb)   SpO2 99%   BMI 25.46 kg/m  Constitutional: She appears well-developed and well-nourished. No distress.  HENT: Normocephalic and atraumatic.  Eyes: EOMI. No discharge.  Cardiovascular: IRIR. No JVD  Respiratory: Effort normal and breath sounds normal.  GI: Bowel sounds are normal. She exhibits no distension. Musculoskeletal: She exhibits no edema or tenderness.  Neurological: She is alert and oriented.  Motor: B/l UE: 4+/5 proximal to distal B/l LE: HF 3-/5, KE 3/5, ADF/PF 3+/5 (unchanged) Skin: Skin is warm and dry. She is not diaphoretic. Right foot with healed abrasions.  LLE with ulcer and dressing clean.   Sacral ulcer, not examined today. Psychiatric: She has a normal mood and affect. Her behavior is normal. Judgment and thought content normal.   Assessment/Plan: 1. 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 Physiatrist is providing close  team supervision and 24 hour management of active medical problems listed below. Physiatrist and rehab team continue to assess barriers to discharge/monitor patient progress toward functional and medical goals.  Function:  Bathing Bathing position   Position: Bed (LB supine in bed; UB at sink in w/c)  Bathing parts Body parts bathed by patient: Right arm, Left arm, Chest, Abdomen, Front perineal area, Right upper leg, Left upper leg, Right lower leg Body parts bathed by helper: Back  Bathing assist Assist Level: Touching or steadying assistance(Pt > 75%)   Set up : To obtain items  Upper Body Dressing/Undressing Upper body dressing   What is the patient wearing?: Pull over shirt/dress     Pull over shirt/dress - Perfomed by patient: Thread/unthread right sleeve, Thread/unthread left sleeve, Put head through opening, Pull shirt over trunk Pull over shirt/dress - Perfomed by helper: Pull shirt over trunk        Upper body assist Assist Level:  (Min. assist)   Set up : To obtain clothing/put away  Lower Body Dressing/Undressing Lower body dressing   What is the patient wearing?: Pants, Non-skid slipper socks     Pants- Performed by patient: Thread/unthread right pants leg, Thread/unthread left pants leg, Pull pants up/down Pants- Performed by helper: Thread/unthread left pants leg Non-skid slipper socks- Performed by patient: Don/doff right sock Non-skid slipper socks- Performed by  helper: Don/doff left sock                  Lower body assist Assist for lower body dressing: Touching or steadying assistance (Pt > 75%)      Toileting Toileting     Toileting steps completed by helper: Adjust clothing prior to toileting, Performs perineal hygiene, Adjust clothing after toileting    Toileting assist Assist level:  (Total Assist)   Transfers Chair/bed transfer Chair/bed transfer activity did not occur: Safety/medical concerns (incontinent of bowel, time  contraints) Chair/bed transfer method: Lateral scoot Chair/bed transfer assist level: Touching or steadying assistance (Pt > 75%) Chair/bed transfer assistive device: Sliding board     Locomotion Ambulation Ambulation activity did not occur: Safety/medical concerns (incontinent of bowel, time constraints)   Max distance: 5 Assist level: 2 helpers   Oceanographer activity did not occur: Safety/medical concerns (incontinent of bowel; time constraints) Type: Manual Max wheelchair distance: 128ft  Assist Level: Touching or steadying assistance (Pt > 75%)  Cognition Comprehension Comprehension assist level: Follows complex conversation/direction with no assist  Expression Expression assist level: Expresses complex ideas: With no assist  Social Interaction Social Interaction assist level: Interacts appropriately with others with medication or extra time (anti-anxiety, antidepressant).  Problem Solving Problem solving assist level: Solves basic problems with no assist  Memory Memory assist level: Complete Independence: No helper    Medical Problem List and Plan: 1.  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  Cont CIR, plan for d/c tomorrow 2.  DVT Prophylaxis/Anticoagulation: Pharmaceutical: Cont Eliquis  3. Pain Management: tylenol prn  4. Mood: Remains motivated. LCSW to follow for support.  5. Neuropsych: This patient is not fully capable of making decisions on her own behalf. 6. Skin/Wound Care: Maintain adequate nutritional status to help promote healing.   VAC d/ced, spoke with PRS PA, MD ?daily evaluation per pt, cont dressing changes  Hydrotherapy for sacral ulcer  Sharp debridement today. 7. Fluids/Electrolytes/Nutrition: Monitor I/Os. Continue megace to help with appetite. On cardiac diet to help with food choices--add 1200 cc/FR 8. A fib with RVR: To continue amiodarone at 400 mg bid and metoprolol 50 mg bid.  9. CKD, now dialysis  dependent: AVG postponed for now. HD dependent--Tue/Thu/Sat schedule at end of day to help with tolerance of therapy. 10. Hypotension: On amiodarone 10 mg tid.  Relatively controlled 9/10 11. Anemia of chronic disease: IV iron for iron deficiency. On aranesp weekly.  Labs with HD  Hb 10.4 on 9/8 12. Left foot wound: Wound VAC over Acell for healing--dressing changes weekly by plastics.  13. Anxiety/depression: Team to provide ego support.  Continue Seroquel and Zoloft.  14. Constipation: Continue miralax and linzess. Increase senna S to bid.  15. Neutropenia: Resolved  Cont to monitor  Consulted Heme/Onc due to neutropenia and previous transvaginal U/S concerning for endometrial CA  CT reviewed, limited, but relatively unremarkable for abdominal pathology  Heme/Onc singed off after spontaneous resolution  LOS (Days) 12 A FACE TO FACE EVALUATION WAS PERFORMED  Violeta Lecount Lorie Phenix 06/18/2017 11:09 AM

## 2017-06-18 NOTE — Progress Notes (Signed)
Physical Therapy Wound Treatment Patient Details  Name: [name] MRN: [id number] Date of Birth: 1951/01/09  Today's Date: 06/18/2017 PT Individual Time: 3817-7116 PT Individual Time Calculation (min): 34 min   Subjective  Subjective: Pt pleasant and agreeable to hydrotherapy Patient and Family Stated Goals: To heal wounds  Pain Score:  Pt complained of minor pain/discomfort during debridement.  Wound Assessment  Pressure Injury 06/03/17 Unstageable - Full thickness tissue loss in which the base of the ulcer is covered by slough (yellow, tan, gray, green or brown) and/or eschar (tan, brown or black) in the wound bed. yellow slough in area on rt buttocks (Active)  Dressing Type ABD;Barrier Film (skin prep);Gauze (Comment);Moist to dry;Paper tape 06/18/2017  9:00 AM  Dressing Clean;Dry;Intact 06/18/2017  9:00 AM  Dressing Change Frequency Daily 06/18/2017  9:00 AM  State of Healing Early/partial granulation 06/18/2017  9:00 AM  Site / Wound Assessment Yellow;Pink 06/18/2017  9:00 AM  % Wound base Red or Granulating 25% 06/18/2017  9:00 AM  % Wound base Yellow/Fibrinous Exudate 75% 06/18/2017  9:00 AM  % Wound base Black/Eschar 0% 06/18/2017  9:00 AM  % Wound base Other/Granulation Tissue (Comment) 0% 06/18/2017  9:00 AM  Peri-wound Assessment Intact;Pink 06/18/2017  9:00 AM  Wound Length (cm) 0.2 cm 06/14/2017  9:00 AM  Wound Width (cm) 0.4 cm 06/14/2017  9:00 AM  Wound Depth (cm) 0.1 cm 06/14/2017  9:00 AM  Tunneling (cm) 0 06/14/2017  9:00 AM  Undermining (cm) 0 06/14/2017  9:00 AM  Margins Unattached edges (unapproximated) 06/18/2017  9:00 AM  Drainage Amount Scant 06/18/2017  9:00 AM  Drainage Description Purulent;No odor 06/18/2017  9:00 AM  Treatment Debridement (Selective);Hydrotherapy (Pulse lavage);Packing (Saline gauze) 06/18/2017  9:00 AM   Santyl applied to wound bed prior to applying dressing.      Pressure Injury 06/12/17 Unstageable - Full thickness tissue loss in which the base of the  ulcer is covered by slough (yellow, tan, gray, green or brown) and/or eschar (tan, brown or black) in the wound bed. slough covered area on left buttocks (Active)  Dressing Type Gauze (Comment) 06/18/2017  9:00 AM  Dressing Changed 06/18/2017  9:00 AM  Dressing Change Frequency Daily 06/18/2017  9:00 AM  State of Healing Early/partial granulation 06/18/2017  9:00 AM  Site / Wound Assessment Yellow;Pink 06/18/2017  9:00 AM  % Wound base Red or Granulating 30% 06/18/2017  9:00 AM  % Wound base Yellow/Fibrinous Exudate 70% 06/18/2017  9:00 AM  % Wound base Black/Eschar 0% 06/18/2017  9:00 AM  % Wound base Other/Granulation Tissue (Comment) 0% 06/18/2017  9:00 AM  Peri-wound Assessment Intact;Pink 06/18/2017  9:00 AM  Wound Length (cm) 0.5 cm 06/14/2017  9:00 AM  Wound Width (cm) 1.5 cm 06/14/2017  9:00 AM  Wound Depth (cm) 0.1 cm 06/14/2017  9:00 AM  Tunneling (cm) 0 06/14/2017  9:00 AM  Undermining (cm) 0 06/14/2017  9:00 AM  Margins Unattached edges (unapproximated) 06/18/2017  9:00 AM  Drainage Amount Scant 06/18/2017  9:00 AM  Drainage Description No odor;Purulent 06/18/2017  9:00 AM  Treatment Debridement (Selective);Hydrotherapy (Pulse lavage);Packing (Saline gauze) 06/18/2017  9:00 AM   Santyl applied to wound bed prior to applying dressing.   Pressure Injury 06/14/17 Unstageable - Full thickness tissue loss in which the base of the ulcer is covered by slough (yellow, tan, gray, green or brown) and/or eschar (tan, brown or black) in the wound bed. (Active)  Dressing Type ABD;Barrier Film (skin prep);Gauze (Comment);Moist to dry;Paper tape  06/18/2017  9:00 AM  Dressing Clean;Dry;Intact 06/18/2017  9:00 AM  Dressing Change Frequency Daily 06/18/2017  9:00 AM  State of Healing Early/partial granulation 06/18/2017  9:00 AM  Site / Wound Assessment Yellow;Pink 06/18/2017  9:00 AM  % Wound base Red or Granulating 50% 06/18/2017  9:00 AM  % Wound base Yellow/Fibrinous Exudate 50% 06/18/2017  9:00 AM  % Wound base  Black/Eschar 0% 06/18/2017  9:00 AM  % Wound base Other/Granulation Tissue (Comment) 0% 06/18/2017  9:00 AM  Peri-wound Assessment Intact;Pink 06/18/2017  9:00 AM  Wound Length (cm) 3.2 cm 06/14/2017  9:00 AM  Wound Width (cm) 2 cm 06/14/2017  9:00 AM  Wound Depth (cm) 0.1 cm 06/14/2017  9:00 AM  Tunneling (cm) 0 06/14/2017  9:00 AM  Undermining (cm) 0 06/14/2017  9:00 AM  Margins Unattached edges (unapproximated) 06/18/2017  9:00 AM  Drainage Amount Scant 06/18/2017  9:00 AM  Drainage Description Purulent;No odor 06/18/2017  9:00 AM  Treatment Debridement (Selective);Hydrotherapy (Pulse lavage);Packing (Saline gauze) 06/18/2017  9:00 AM   Santyl applied to wound bed prior to applying dressing.    Pressure Injury 06/14/17 Unstageable - Full thickness tissue loss in which the base of the ulcer is covered by slough (yellow, tan, gray, green or brown) and/or eschar (tan, brown or black) in the wound bed. (Active)  Dressing Type ABD;Barrier Film (skin prep);Gauze (Comment);Moist to dry;Paper tape 06/18/2017  9:00 AM  Dressing Clean;Dry;Intact 06/18/2017  9:00 AM  Dressing Change Frequency Daily 06/18/2017  9:00 AM  State of Healing Early/partial granulation 06/18/2017  9:00 AM  Site / Wound Assessment Yellow;Pink 06/18/2017  9:00 AM  % Wound base Red or Granulating 50% 06/18/2017  9:00 AM  % Wound base Yellow/Fibrinous Exudate 50% 06/18/2017  9:00 AM  % Wound base Black/Eschar 0% 06/18/2017  9:00 AM  % Wound base Other/Granulation Tissue (Comment) 0% 06/18/2017  9:00 AM  Peri-wound Assessment Intact;Pink 06/18/2017  9:00 AM  Wound Length (cm) 0.6 cm 06/14/2017  9:00 AM  Wound Width (cm) 0.6 cm 06/14/2017  9:00 AM  Wound Depth (cm) 0.1 cm 06/14/2017  9:00 AM  Tunneling (cm) 0 06/14/2017  9:00 AM  Undermining (cm) 0 06/14/2017  9:00 AM  Margins Unattached edges (unapproximated) 06/18/2017  9:00 AM  Drainage Amount Scant 06/18/2017  9:00 AM  Drainage Description Purulent;No odor 06/18/2017  9:00 AM  Treatment Debridement  (Selective);Hydrotherapy (Pulse lavage);Packing (Saline gauze) 06/18/2017  9:00 AM  Santyl applied to wound bed prior to applying dressing.   Hydrotherapy Pulsed lavage therapy - wound location: x4 buttock wounds Pulsed Lavage with Suction (psi): 12 psi Pulsed Lavage with Suction - Normal Saline Used: 1000 mL Pulsed Lavage Tip: Tip with splash shield Selective Debridement Selective Debridement - Location: x4 buttock wounds Selective Debridement - Tools Used: Scalpel Selective Debridement - Tissue Removed: yellow nonviable tissue   Wound Assessment and Plan  Wound Therapy - Assess/Plan/Recommendations Wound Therapy - Clinical Statement: Noted increased pink/pre-granular tissue with easy bleeding during debridement. Pt will benefit from continued hydrotherapy for selective removal of unviable tissue, and to promote wound bed healing.  Wound Therapy - Functional Problem List: Decreased tolerance for sitting up OOB Factors Delaying/Impairing Wound Healing: Incontinence;Immobility;Multiple medical problems Hydrotherapy Plan: Debridement;Dressing change;Patient/family education;Pulsatile lavage with suction Wound Therapy - Frequency: 6X / week Wound Therapy - Follow Up Recommendations: Home health RN Wound Plan: See above  Wound Therapy Goals- Improve the function of patient's integumentary system by progressing the wound(s) through the phases of wound healing (inflammation -  proliferation - remodeling) by: Decrease Necrotic Tissue to: 0% Decrease Necrotic Tissue - Progress: Progressing toward goal Increase Granulation Tissue to: 100% Increase Granulation Tissue - Progress: Progressing toward goal Goals/treatment plan/discharge plan were made with and agreed upon by patient/family: Yes Time For Goal Achievement: 7 days Wound Therapy - Potential for Goals: Good  Goals will be updated until maximal potential achieved or discharge criteria met.  Discharge criteria: when goals achieved,  discharge from hospital, MD decision/surgical intervention, no progress towards goals, refusal/missing three consecutive treatments without notification or medical reason.  GP     Thelma Comp 06/18/2017, 9:31 AM   Rolinda Roan, PT, DPT Acute Rehabilitation Services Pager: [phone]

## 2017-06-18 NOTE — Progress Notes (Signed)
Occupational Therapy Session Note  Patient Details  Name: [name] MRN: [id number] Date of Birth: 10-May-1951  Today's Date: 06/18/2017 OT Individual Time: 0132-0218 OT Individual Time Calculation (min): 46 min    Short Term Goals: Week 2:  OT Short Term Goal 1 (Week 2): STG = LTGs due to remaining LOS  Skilled Therapeutic Interventions/Progress Updates:    Pt seen to focus on functional transfers and lateral leaning to improve patient independence in clothing management and toilet hygiene in preparation for d/c. Pt had no c/o of pain but reported fatigue. Bed <> w/c and w/c <> therapy mat slide board transfers completed with min. A and mod verbal cues for technique. Pt performed simulated therapeutic activity with therapist to engage pt in functional mobility to improve independence in slide board transfers. Pt sit > partial stand x6 with BUE support to facilitate weight shifting needed for slide board transfers with min. A and encouragement provided by therapist. Pt performed lateral leans on left and right x5 on each side to facilitate weight shifting needed for toilet hygiene and to lessen burden of care in preparation for d/c. Pt returned to bed via slide board transfer requiring mod. A fading to min. A due to fatigue. Pt left with all needs in reach.  Therapy Documentation Precautions:  Precautions Precautions: Fall Precaution Comments: R IJ perm cath; wound vac LLE; LUE DVT; wounds buttocks Restrictions Weight Bearing Restrictions: No General:   Vital Signs:  Pain: Pain Assessment Pain Assessment: No/denies pain ADL:   Vision Baseline Vision/History: Wears glasses Wears Glasses: At all times Patient Visual Report: No change from baseline Vision Assessment?: No apparent visual deficits Perception  Perception: Within Functional Limits Praxis Praxis: Intact  See Function Navigator for Current Functional Status.   Therapy/Group: Individual Therapy  Hoyt Koch 06/18/2017, 3:25 PM

## 2017-06-18 NOTE — Progress Notes (Signed)
Physical Therapy Discharge Summary  Patient Details  Name: [name] MRN: [id number] Date of Birth: [birth year]    Patient has met 7 of 8 long term goals due to improved activity tolerance, improved balance, increased strength and ability to compensate for deficits.  Patient to discharge at a wheelchair level [place].   Patient's care partner is independent to provide the necessary physical assistance at discharge.  Reasons goals not met: pt unable to perform gait training due to anxiety and weakness  Recommendation:  Patient will benefit from ongoing skilled PT services in home health setting to continue to advance safe functional mobility, address ongoing impairments in strength, endurance, functional mobility, and minimize fall risk.  Equipment: RW, w/c, hospital bed, sliding board  Reasons for discharge: treatment goals met and discharge from hospital  Patient/family agrees with progress made and goals achieved: Yes  PT Discharge Precautions/Restrictions- fall    Pain- none per pt   Vision/Perception - wears glasses; no change    Cognition Overall Cognitive Status: Impaired/Different from baseline Arousal/Alertness: Awake/alert Orientation Level: Oriented X4 Attention: Selective Selective Attention: Appears intact Memory: Impaired Memory Impairment: Decreased recall of new information Awareness: Appears intact Problem Solving: Impaired Sensation Sensation Light Touch: Appears Intact Coordination Heel Shin Test: NT due to weakness Motor  Motor Motor - Skilled Clinical Observations: generalized weakness Motor - Discharge Observations: generalized weakness  Mobility Bed Mobility Bed Mobility: Rolling Right;Rolling Left;Right Sidelying to Sit;Sit to Supine Rolling Right: 3: Mod assist Rolling Right Details: Manual facilitation for weight shifting;Verbal cues for technique;Verbal cues for sequencing;Manual facilitation for placement;Manual facilitation for  weight bearing Rolling Left: 4: Min assist Rolling Left Details: Manual facilitation for weight shifting;Verbal cues for technique Right Sidelying to Sit Details: Verbal cues for sequencing;Verbal cues for technique;Manual facilitation for weight shifting;Manual facilitation for placement Sit to Supine: 3: Mod assist Sit to Supine - Details: Manual facilitation for placement;Manual facilitation for weight bearing;Manual facilitation for weight shifting;Verbal cues for precautions/safety Transfers Sit to Stand Details (indicate cue type and reason): slide board transfer with mod assist after set up, mod cues for sequencing and technique Locomotion  Ambulation Ambulation: No Gait Gait: No Stairs / Additional Locomotion Stairs: No Wheelchair Mobility Wheelchair Mobility: Yes Wheelchair Assistance: 5: Careers information officer: Both upper extremities Distance: 150 with 1 rest break  Trunk/Postural Assessment  Cervical Assessment Cervical Assessment: Within Functional Limits Thoracic Assessment Thoracic Assessment: Exceptions to [facility] (mild kyphosis) Lumbar Assessment Lumbar Assessment: Exceptions to [facility] (sits in posterior pelvic tilt) Postural Control Postural Control: Deficits on evaluation Righting Reactions: delayed  Balance Balance Balance Assessed: Yes Static Sitting Balance Static Sitting - Level of Assistance: 5: Stand by assistance Dynamic Sitting Balance Dynamic Sitting - Level of Assistance: 4: Min assist Sitting balance - Comments: LOB R/L when reaching within BOS Static Standing Balance Static Standing - Level of Assistance: Not tested (comment) Dynamic Standing Balance Dynamic Standing - Level of Assistance: Not tested (comment) Extremity Assessment      RLE Assessment RLE Assessment: Exceptions to [facility] RLE Strength RLE Overall Strength Comments: grossly in sitting: hip flex 2-/5; hip adduction 3+/5, abduction 3+/5,knee ext 3+/5, ankle DF 4-/5 LLE  Assessment LLE Assessment: Exceptions to [facility] (wound lower anterior leg; mild edema LL) LLE Strength LLE Overall Strength Comments: grossly in sitting: hip flex 2-/5, hip abduction 4-/5, knee ext 3+/5, ankle DF 4-/5   See Function Navigator for Current Functional Status.  COOK,CAROLINE 06/18/2017, 3:46 PM

## 2017-06-18 NOTE — Progress Notes (Signed)
Physical Therapy Note  Patient Details  Name: [name] MRN: [id number] Date of Birth: [birth year] Today's Date: 06/18/2017  1105-1205, 60 min individual tx Pain: none reported    Husband is buying a sedan today with lower seats than their [employer].  Tx focused on family ed with husband for simulated car transfer, discussion with husband about need for further ed to ensure safe car transfer, w/c propulsion, seated neuro re-ed, bed mobility and transfers.   Husband performed simulated car transfer x 1 to 21-22" high seat, level with w/c, using slide board.  He stated he had to go to car dealership and could not practice another transfer.  He needs further training on head/hips relationship and his need to give pt space to lean forward and laterally during placement of board and scooting forward/backward in seat, , importance of pt performing lifts across board instead of sliding, and managing leg rests on and off w/c.   Pt performed slide board transfer with mod assist.  She propelled w/c x 150' with supervision.  Pt left resting in w/c with lunch tray set up, and all needs within reach.  See function navigator for current status.  Nadege Carriger 06/18/2017, 3:32 PM

## 2017-06-18 NOTE — Progress Notes (Signed)
Occupational Therapy Discharge Summary  Patient Details  Name: [name] MRN: [id number] Date of Birth: Oct 27, 1950  Patient has met 7 of 9 long term goals due to improved activity tolerance, improved balance, postural control, ability to compensate for deficits and improved awareness.  Patient to discharge at [facility] Assist level with bathing and LB dressing at bed level, min-mod assist transfers depending on level of fatigue.  Patient's care partner is independent to provide the necessary physical and cognitive assistance at discharge.  Pt requires frequent cues for sequencing and problem solving during self-care tasks and transfers, pt's husband has demonstrated ability to provide appropriate cues and physical assistance.    Reasons goals not met: Pt continues to require mod-max assist for sit > stand and +2 to complete any functional task in standing.  Pt continues to require total assist for toileting tasks with mod cues for lateral leans while caregiver manages clothing and hygiene.    Recommendation:  Patient will benefit from ongoing skilled OT services in home health setting to continue to advance functional skills in the area of BADL and Reduce care partner burden.  Equipment: drop arm BSC, hospital bed  Reasons for discharge: treatment goals met and discharge from hospital  Patient/family agrees with progress made and goals achieved: Yes  OT Discharge Precautions/Restrictions  Precautions Precautions: Fall Precaution Comments: R IJ perm cath; LUE DVT; wounds buttocks General   Vital Signs Therapy Vitals Temp: (!) 97.5 F (36.4 C) Temp Source: Oral Pulse Rate: (!) 47 Resp: 16 BP: (!) 70/39 Patient Position (if appropriate): Lying Oxygen Therapy SpO2: 97 % O2 Device: Not Delivered Pain Pain Assessment Pain Assessment: No/denies pain Pain Score: 0-No pain ADL   Vision Baseline Vision/History: Wears glasses Wears Glasses: At all times Patient Visual Report:  No change from baseline Vision Assessment?: No apparent visual deficits Perception  Perception: Within Functional Limits Praxis Praxis: Intact Cognition Overall Cognitive Status: Impaired/Different from baseline Arousal/Alertness: Awake/alert Orientation Level: Oriented X4 Attention: Selective Selective Attention: Appears intact Memory: Impaired Memory Impairment: Decreased recall of new information Awareness: Appears intact Problem Solving: Impaired Safety/Judgment: Appears intact Sensation Sensation Light Touch: Appears Intact Hot/Cold: Appears Intact Proprioception: Appears Intact Coordination Gross Motor Movements are Fluid and Coordinated: No Fine Motor Movements are Fluid and Coordinated: Yes Finger Nose Finger Test: slow due to limited shoulder ROM, but fluid Heel Shin Test: NT due to weakness Motor    Mobility     Trunk/Postural Assessment     Balance   Extremity/Trunk Assessment RUE Assessment RUE Assessment: Exceptions to [facility] (shoulder flexion grossly 60* against gravity, full PROM, elbow extension -10*, loose gross grasp. Strength grossly 2+/5 ) LUE Assessment LUE Assessment: Exceptions to [facility] (shoulder flexion grossly 60* against gravity, PROM WFL, elbow extension -10*, strength grossly 2+/5)   See Function Navigator for Current Functional Status.  [name] 06/19/2017, 7:44 AM

## 2017-06-19 ENCOUNTER — Inpatient Hospital Stay (HOSPITAL_COMMUNITY): Payer: Medicare Other

## 2017-06-19 DIAGNOSIS — D72829 Elevated white blood cell count, unspecified: Secondary | ICD-10-CM

## 2017-06-19 LAB — RENAL FUNCTION PANEL
ALBUMIN: 2.5 g/dL — AB (ref 3.5–5.0)
ANION GAP: 11 (ref 5–15)
BUN: 31 mg/dL — AB (ref 6–20)
CHLORIDE: 98 mmol/L — AB (ref 101–111)
CO2: 25 mmol/L (ref 22–32)
Calcium: 8.3 mg/dL — ABNORMAL LOW (ref 8.9–10.3)
Creatinine, Ser: 4.65 mg/dL — ABNORMAL HIGH (ref 0.44–1.00)
GFR, EST AFRICAN AMERICAN: 10 mL/min — AB (ref >60)
GFR, EST NON AFRICAN AMERICAN: 9 mL/min — AB (ref >60)
Glucose, Bld: 73 mg/dL (ref 65–99)
PHOSPHORUS: 2.8 mg/dL (ref 2.5–4.6)
POTASSIUM: 3.7 mmol/L (ref 3.5–5.1)
Sodium: 134 mmol/L — ABNORMAL LOW (ref 135–145)

## 2017-06-19 LAB — CBC
HEMATOCRIT: 34.5 % — AB (ref 36.0–46.0)
HEMOGLOBIN: 10.8 g/dL — AB (ref 12.0–15.0)
MCH: 34.1 pg — ABNORMAL HIGH (ref 26.0–34.0)
MCHC: 31.3 g/dL (ref 30.0–36.0)
MCV: 108.8 fL — AB (ref 78.0–100.0)
Platelets: 290 10*3/uL (ref 150–400)
RBC: 3.17 MIL/uL — AB (ref 3.87–5.11)
RDW: 21.2 % — ABNORMAL HIGH (ref 11.5–15.5)
WBC: 14.4 10*3/uL — AB (ref 4.0–10.5)

## 2017-06-19 MED ORDER — RENA-VITE PO TABS: 1.0000 | ORAL_TABLET | Freq: Every day | ORAL | 0 refills | Status: AC

## 2017-06-19 MED ORDER — HYDROCERIN EX CREA: 1.0000 "application " | TOPICAL_CREAM | Freq: Two times a day (BID) | CUTANEOUS | 0 refills | Status: AC

## 2017-06-19 MED ORDER — MIDODRINE HCL 10 MG PO TABS: 10.0000 mg | ORAL_TABLET | Freq: Three times a day (TID) | ORAL | 0 refills | Status: AC

## 2017-06-19 MED ORDER — POLYETHYLENE GLYCOL 3350 17 G PO PACK: 17.0000 g | PACK | Freq: Two times a day (BID) | ORAL | 0 refills | Status: DC

## 2017-06-19 MED ORDER — MIDODRINE HCL 5 MG PO TABS
ORAL_TABLET | ORAL | Status: AC
  Filled 2017-06-19: qty 2

## 2017-06-19 MED ORDER — FAMOTIDINE 20 MG PO TABS: 20.0000 mg | ORAL_TABLET | Freq: Every day | ORAL | 0 refills | Status: AC

## 2017-06-19 MED ORDER — QUETIAPINE FUMARATE 25 MG PO TABS: 25.0000 mg | ORAL_TABLET | Freq: Every day | ORAL | 0 refills | Status: DC

## 2017-06-19 MED ORDER — MEGESTROL ACETATE 40 MG PO TABS: 40.0000 mg | ORAL_TABLET | Freq: Two times a day (BID) | ORAL | 1 refills | Status: AC

## 2017-06-19 MED ORDER — ALTEPLASE 2 MG IJ SOLR
4.0000 mg | Freq: Once | INTRAMUSCULAR | Status: AC
  Administered 2017-06-19: 4 mg

## 2017-06-19 MED ORDER — SENNOSIDES-DOCUSATE SODIUM 8.6-50 MG PO TABS: 2.0000 | ORAL_TABLET | Freq: Two times a day (BID) | ORAL | 0 refills | Status: AC

## 2017-06-19 MED ORDER — SERTRALINE HCL 25 MG PO TABS: 25.0000 mg | ORAL_TABLET | Freq: Every day | ORAL | 0 refills | Status: DC

## 2017-06-19 MED ORDER — NYSTATIN 100000 UNIT/ML MT SUSP: 5.0000 mL | Freq: Four times a day (QID) | OROMUCOSAL | 0 refills | Status: DC

## 2017-06-19 MED ORDER — AMIODARONE HCL 400 MG PO TABS: 400.0000 mg | ORAL_TABLET | Freq: Two times a day (BID) | ORAL | 0 refills | Status: AC

## 2017-06-19 MED ORDER — FAMOTIDINE 20 MG PO TABS: 20.0000 mg | ORAL_TABLET | Freq: Every day | ORAL | 0 refills | Status: DC

## 2017-06-19 MED ORDER — LEVOTHYROXINE SODIUM 100 MCG PO TABS: 100.0000 ug | ORAL_TABLET | Freq: Every day | ORAL | 0 refills | Status: AC

## 2017-06-19 MED ORDER — COLLAGENASE 250 UNIT/GM EX OINT: TOPICAL_OINTMENT | Freq: Every day | CUTANEOUS | 0 refills | Status: AC

## 2017-06-19 MED ORDER — APIXABAN 5 MG PO TABS: 5.0000 mg | ORAL_TABLET | Freq: Two times a day (BID) | ORAL | 0 refills | Status: DC

## 2017-06-19 MED ORDER — AMIODARONE HCL 400 MG PO TABS: 400.0000 mg | ORAL_TABLET | Freq: Two times a day (BID) | ORAL | 0 refills | Status: DC

## 2017-06-19 MED ORDER — METOPROLOL TARTRATE 50 MG PO TABS: 50.0000 mg | ORAL_TABLET | Freq: Two times a day (BID) | ORAL | 0 refills | Status: DC

## 2017-06-19 MED ORDER — SUCROFERRIC OXYHYDROXIDE 500 MG PO CHEW: 500.0000 mg | CHEWABLE_TABLET | Freq: Three times a day (TID) | ORAL | 0 refills | Status: DC

## 2017-06-19 MED ORDER — ALTEPLASE 2 MG IJ SOLR
INTRAMUSCULAR | Status: AC
  Filled 2017-06-19: qty 4

## 2017-06-19 MED ORDER — POLYETHYLENE GLYCOL 3350 17 G PO PACK: 17.0000 g | PACK | Freq: Every day | ORAL | 0 refills | Status: AC | PRN

## 2017-06-19 NOTE — Progress Notes (Signed)
Pt ready for discharge today. Algis Liming, PA reviewed discharge instructions with patient and husband. This RN provided dialysis information to patient including location and chair time. This RN also educated pt and husband on wound care to buttocks and wounds on LLE. Husband assisted with wound care. Pt and husband escorted by NT Candance to parking lot.

## 2017-06-19 NOTE — Progress Notes (Signed)
Assessment/ Plan:   1. Chronic systolic congestive heart failure/dilated cardiomyopathy: Volume improved, BP down with HD 2. ESRD:  Tuesday/Thursday/Saturday schedule.  Permanent access placement deferred to out-patient.  Has R IJ TDC in place. 3. Anemia:continue intravenous iron/ESA.  Aranesp 60 mcg every Thursday; Nulecit 125 mg q treatment x 7 doses (started 06/07/17). 4. Atrial fibrillation with RVR: amiodarone and metoprolol, on Eliquis as well 5. Hypotension:On midodrine, monitor with ultrafiltration and hemodialysis.  IDH a problem- will decrease machine temperature, UF profile 4, midodrine 10 mg before treatment and 10 mg during if needed.  Additionally she had been running only a 3 hr treatment time it appears; I will extend this time to 3 hrs 30 min for now and she will probably need even longer time based on UF and kinetics (ie using the catheter).   6. Bone/ mineral:  Phos 2.8, on velphoro 500 mg TID with meals.   7.  Nutrition: albumin 2.5, on protein supplements 8. Dispo: CLIP'd to [facility].   Subjective: Interval History: No complaints.  [place] OP HD on TTS- CLIP complete  Objective: Vital signs in last 24 hours: Temp:  [97.5 F (36.4 C)-98.1 F (36.7 C)] 97.5 F (36.4 C) (09/11 0620) Pulse Rate:  [47-61] 47 (09/11 0700) Resp:  [16-18] 16 (09/11 0626) BP: (66-115)/(37-66) 70/39 (09/11 0700) SpO2:  [97 %-100 %] 97 % (09/11 0620) Weight:  [72 kg (158 lb 11.7 oz)] 72 kg (158 lb 11.7 oz) (09/11 0620) Weight change: 2.6 kg (5 lb 11.7 oz)  Intake/Output from previous day: 09/10 0701 - 09/11 0700 In: 440 [P.O.:440] Out: -  Intake/Output this shift: No intake/output data recorded.  Resp: clear to auscultation bilaterally Cardio: regular rate and rhythm, S1, S2 normal, no murmur, click, rub or gallop  R IJ TDC Ext 1+ edema  Lab Results:  Recent Labs  06/16/17 0830 06/19/17 0638  WBC 8.0 14.4*  HGB 10.4* 10.8*  HCT 34.2* 34.5*  PLT 312 290   BMET:   Recent  Labs  06/16/17 0839 06/19/17 0639  NA 135 134*  K 3.4* 3.7  CL 96* 98*  CO2 26 25  GLUCOSE 74 73  BUN 19 31*  CREATININE 3.30* 4.65*  CALCIUM 8.5* 8.3*   No results for input(s): PTH in the last 72 hours. Iron Studies: No results for input(s): IRON, TIBC, TRANSFERRIN, FERRITIN in the last 72 hours. Studies/Results: No results found.  Scheduled: . midodrine      . amiodarone  400 mg Oral BID  . apixaban  5 mg Oral BID  . collagenase   Topical Daily  . darbepoetin (ARANESP) injection - DIALYSIS  60 mcg Intravenous Q Thu-HD  . famotidine  20 mg Oral QHS  . feeding supplement (NEPRO CARB STEADY)  237 mL Oral BID BM  . hydrocerin   Topical BID  . levothyroxine  100 mcg Oral QAC breakfast  . megestrol  40 mg Oral BID  . metoprolol tartrate  50 mg Oral BID  . midodrine  10 mg Oral TID WC  . multivitamin  1 tablet Oral QHS  . nystatin  5 mL Oral QID  . polyethylene glycol  17 g Oral BID  . QUEtiapine  25 mg Oral QHS  . senna-docusate  2 tablet Oral BID  . sertraline  25 mg Oral QHS  . sucroferric oxyhydroxide  500 mg Oral TID WC    LOS: 13 days   Bow Buntyn 06/19/2017,7:36 AM

## 2017-06-19 NOTE — Discharge Summary (Signed)
Physician Discharge Summary  Patient ID: Lenix Benoist MRN: [id number] DOB/AGE: 1951/04/23 66 y.o.  Admit date: 06/06/2017 Discharge date: 06/19/2017  Discharge Diagnoses:  Principal Problem:   Critical illness myopathy Active Problems:   Acute blood loss anemia   Splinter of foot without major open wound or infection   Dependence on renal dialysis ([place])   Chronic kidney disease (CKD), stage IV (severe) (HCC)   Atrial fibrillation with rapid ventricular response (HCC)   Chronic anticoagulation   Skin ulcer of sacrum ([place])   Discharged Condition: stable   Significant Diagnostic Studies: Dg Chest 2 View  Result Date: 06/19/2017 CLINICAL DATA:  Leukocytosis.  Weakness.  Shortness of breath . EXAM: CHEST  2 VIEW COMPARISON:  Chest x-ray 11/28/2016. FINDINGS: Right IJ dialysis catheter stable position. Stable cardiomegaly and mild pulmonary venous congestion. Low lung volumes with basilar atelectasis. Again mild right base infiltrate and small right pleural effusion cannot be excluded. Small left pleural effusion appears be present on today's exam. IMPRESSION: 1.  Right IJ dialysis catheter stable position. 2. Stable cardiomegaly and mild pulmonary venous congestion. Again mild right base infiltrate small right pleural effusion cannot be excluded. Small left pleural effusion appears be present on today's exam . Electronically Signed   By: Marcello Moores  Register   On: 06/19/2017 12:41   Dg Chest 2 View  Result Date: 05/30/2017 CLINICAL DATA:  CHF. EXAM: CHEST  2 VIEW COMPARISON:  Chest x-ray dated April 21, 2017. FINDINGS: Interval removal of bilateral internal jugular central venous catheters. Interval placement of a tunneled right internal jugular dialysis catheter with the tip at the cavoatrial junction. Interval removal of previously seen enteric tube. Cardiomegaly, unchanged. Mild pulmonary vascular congestion. Mild hazy opacification of the right lung base, likely a combination of pleural effusion  and atelectasis. No consolidation or pneumothorax. No acute osseous abnormality. IMPRESSION: 1. Cardiomegaly with mild pulmonary vascular congestion. 2. Small right pleural effusion with adjacent right lower lobe opacity, likely atelectasis. Electronically Signed   By: Titus Dubin M.D.   On: 05/30/2017 15:44   Ct Abdomen Pelvis W Contrast  Result Date: 06/12/2017 CLINICAL DATA:  Initial workup for endometrial carcinoma. EXAM: CT ABDOMEN AND PELVIS WITH CONTRAST TECHNIQUE: Multidetector CT imaging of the abdomen and pelvis was performed using the standard protocol following bolus administration of intravenous contrast. CONTRAST:  75 mL Isovue 300 was injected into the existing IV in the right antecubital fossa. Examination was complicated by extravasation of contrast material into the right antecubital fossa. None of the injected contrast material into the vascular system in the examination is essentially a noncontrast examination. No IV access was available and no additional contrast-enhanced images were obtained. COMPARISON:  04/04/2017 FINDINGS: Lower chest: Small right pleural effusion. Atelectasis in both lung bases. Mild cardiac enlargement. Coronary artery calcifications. Residual contrast material in the lower esophagus without dilatation. This may indicate reflux or dysmotility. Hepatobiliary: Liver parenchyma appears homogeneous. Evaluation for metastatic disease is limited without contrast material. The gallbladder is mildly distended and multiple stones are present. Stones are present in the gallbladder neck. No gallbladder wall thickening or edema. No bile duct dilatation. Pancreas: Unremarkable. No pancreatic ductal dilatation or surrounding inflammatory changes. Spleen: Normal in size without focal abnormality. Adrenals/Urinary Tract: No adrenal gland nodules. Cyst in the midpole left kidney. No hydronephrosis in either kidney. No stones identified. Bladder wall is not thickened. Stomach/Bowel:  Stomach is within normal limits. Appendix appears normal. No evidence of bowel wall thickening, distention, or inflammatory changes. Vascular/Lymphatic: Aortic atherosclerosis. No  enlarged abdominal or pelvic lymph nodes. Reproductive: Uterus and ovaries are not enlarged. 1.4 cm exophytic structure off of the uterine do not month probably represents a small fibroid. Fat planes around the uterus and cervix appear intact. No obvious endometrial thickening. Other: No abdominal wall hernia or abnormality. No abdominopelvic ascites. Musculoskeletal: Degenerative changes in the spine. No destructive or expansile bone lesions are appreciated. CONTRAST EXTRAVASATION CONSULTATION: Type of contrast:  Isovue 300 Site of extravasation: Right antecubital fossa Estimated volume of extravasation: 75 ml Area of extravasation scanned with CT? Partially included within the field of view. PATIENT'S SIGNS AND SYMPTOMS Skin blistering/ulceration: no Decrease capillary refill: no Change in skin color: No. Pre-existing bruising was present above the antecubital fossa. Decreased motor function or severe tightness: no Decreased pulses distal to site of extravasation: no Altered sensation: no Increasing pain or signs of increased swelling during observation: no. Patient complaining of no pain during or after the extravasation event. TREATMENT Observation period at site: Yes Limb elevation: yes Ice packs applied: yes Heat pads applied: None Plastic surgery consulted? no DOCUMENTATION AND FOLLOW-UP Site contrast extravasation forms submitted? yes Post extravasation orders completed? yes Was additional follow up assigned to PA's? no Patient's questions answered? yes Patient instructed to call [phone] or seek immediate medical care if symptoms progress. IMPRESSION: 1. No definite evidence of metastatic disease although lack of contrast material limits evaluation in some areas. 2. Cholelithiasis with mild gallbladder distention. Can't  exclude early change of cholecystitis. 3. Aortic atherosclerosis. 4. Examination complicated by intravenous contrast extravasation. See above. Electronically Signed   By: Lucienne Capers M.D.   On: 06/12/2017 23:54    Labs:  Basic Metabolic Panel:  Recent Labs Lab 06/14/17 1628 06/16/17 0839 06/19/17 0639  NA 132* 135 134*  K 4.7 3.4* 3.7  CL 96* 96* 98*  CO2 _0 GLUCOSE 78 74 73  BUN 29* 19 31*  CREATININE 3.69* 3.30* 4.65*  CALCIUM 8.7* 8.5* 8.3*  PHOS 2.8 2.5 2.8    CBC:  Recent Labs Lab 06/13/17 0601  06/15/17 0650 06/16/17 0830 06/19/17 0638  WBC 2.9*  < > 5.2 8.0 14.4*  NEUTROABS 0.1*  --  2.0  --   --   HGB 10.5*  < > 11.0* 10.4* 10.8*  HCT 33.4*  33.0*  < > 34.9* 34.2* 34.5*  MCV 109.2*  < > 108.7* 109.6* 108.8*  PLT 268  < > 311 312 290  < > = values in this interval not displayed.  CBG:  Recent Labs Lab 06/16/17 2114  GLUCAP 88    Brief HPI:   Jossalyn Forgione an 66 y.o.female with history of HTN, A fib-on pradaxa who was originally  admitted on [facility] on 04/08/17 due sepsis from cellulitis. Hospital course significant for  PEA arrest 7/4, ARDS, acute renal failure with anuria -- now HD dependent, LUE DVT, A fib with RVR, hypotension and diffuse weakness due to critical illness myopathy. Patient with significant deficits in mobility and self care tasks and was admitted to rehab 05/07/2017 for inpatient therapies to consist of PT, ST and OT.   CBC at admission revealed leucocytosis with WBC 13.4 and she was treated with Keflex X 7 days for pyuria.  She had two episodes of vaginal bleeding and GYN @ [place] recommended megace 40 mg bid to for bleeding as well as Pelvic/vaginal ultrasound which revealed focal 3.7 cm endometrial mass. Dr. Roselie Awkward recommended outpatient exam with biopsy for work up.  HD has been ongoing  at end of day to help with tolerance of therapy.  LLE ulceration with eschar on dorsum of foot and distal tibia were debrided at bedside with poor  results therefore plastics was consulted.  On 8/20, she underwent I and D of wound with placement of Acell by Dr. [name]. Post op she had issues with hypotension as well as A fib with RVR.  She was transferred to telemetry for monitoring on Cardiology felt that recent surgery with inflammatory state was contributing to tachycardia. Low dose BB and amiodarone was added to help with heart rate. Post dialysis on 8/26, she developed A flutter as was transferred to telemetry for treatment and monitoring. She was started on low dose BB and amiodarone with improvement in rate control and transferred back to CIR on 06/06/17 to complete her rehab program.    Hospital Course:   Kloe Oates was redmitted to rehab 06/06/17 inpatient therapies to consist of PT, and OT at least three hours five days a week. Past admission physiatrist, therapy team and rehab RN have worked together to provide customized collaborative inpatient rehab.  Blood pressures and heart rate have been monitored on bid basis and have been relatively controlled.  No cardiac symptoms reported with increase in activity.  She did develop transient neutropenia and Dr. Evelena Leyden was consulted for work up and felt that drop in WBC likely due to medication v/s viral infection.  CT abdomen/pelvis was ordered and showed 1.4 cm exophytic structure off uterine that was probably small fibroid and no obvious endometrial thickening or enlarged abdominal or pelvic lymph nodes noted.    Neutropenia has resolved and Folic acid, B 789 and MMA levels are WNL.   No further work up needed per Dr. Evelena Leyden. Anemia of chronic disease has been managed with IV iron as well as ESA. H/H has improved to 10.8. CBC at discharge showed rise in WBC to 14.4 but no signs of infection noted and patient has been afebrile. CXR done prior to discharge and was negative for infection. She has had breakdown on sacrum due to MASD and pressure unstagable wounds which were treated with hydrotherapy as  well as santyl with wet to dry dressings.  Wounds are showing improvement and were further debrided at bedside by Dr. Posey Pronto.    Plastics has followed up on left foot wound and VAC was removed on 9/5 with recommendations to keep Acell hydrated daily with surgical lubricant.  HHRN to continue to follow wound after discharge. HD has been ongoing and achieving  IDH continues to be an issue.  Weight at discharge is 158 lbs and she will continue to receive outpatient HD on TTS in [place]. [place] support has been provided by team to help with anxiety levels. She has progressed to mod assist at wheelchair level and will continue to receive follow up [place], [place] and [place] by [facility] after discharge.    Rehab course: During patient's stay in rehab weekly team conferences were held to monitor patient's progress, set goals and discuss barriers to discharge. At admission from [facility], patient required total assist with ADL tasks and mobility. She has had improvement in activity tolerance, balance, postural control, as well as ability to compensate for deficits.  She is able to complete ADL tasks at bed level with min assist.   She is able to transfer with min to mod assist and use of SB. She requires mod to max assist for sit to stand to complete functional tasks while standing. Family education  was completed with husband regarding all aspects of care.      Disposition: 01-Home or Self Care  Diet: Renal diet. 1200 cc FR.   Wound Care: 1 - Santyl with damp to dry dressing to areas on buttocks. Change daily.                        2.- Left lower leg wound care: Apply surgical lubricant to the adaptic, meshed layer and cover with 4 x 4 gauze, secure with kerlix and ACE wrapping. OK to weight bear on left leg.  Special Instructions: 1. Limit fluid intake to 1200 cc/day--5 cups. 2. Continue pressure relief measures. Boost every 20 minutes when in chair. Try to side lie when in bed.   Discharge Instructions     Ambulatory referral to Physical Medicine Rehab    Complete by:  As directed    1-2 weeks transitional care     Discharge Medication List as of 06/19/2017  1:59 PM    START taking these medications   Details  apixaban (ELIQUIS) 5 MG TABS tablet Take 1 tablet (5 mg total) by mouth 2 (two) times daily., Starting Tue 06/19/2017, Normal    collagenase (SANTYL) ointment Apply topically daily., Starting Wed 06/20/2017, Normal    hydrocerin (EUCERIN) CREA Apply 1 application topically 2 (two) times daily. To dry areas on feet., Starting Tue 06/19/2017, Normal    megestrol (MEGACE) 40 MG tablet Take 1 tablet (40 mg total) by mouth 2 (two) times daily., Starting Tue 06/19/2017, Normal    metoprolol tartrate (LOPRESSOR) 50 MG tablet Take 1 tablet (50 mg total) by mouth 2 (two) times daily., Starting Tue 06/19/2017, Normal    multivitamin (RENA-VIT) TABS tablet Take 1 tablet by mouth at bedtime., Starting Tue 06/19/2017, Normal    QUEtiapine (SEROQUEL) 25 MG tablet Take 1 tablet (25 mg total) by mouth at bedtime., Starting Tue 06/19/2017, Normal    senna-docusate (SENOKOT-S) 8.6-50 MG tablet Take 2 tablets by mouth 2 (two) times daily., Starting Tue 06/19/2017, Normal    sucroferric oxyhydroxide (VELPHORO) 500 MG chewable tablet Chew 1 tablet (500 mg total) by mouth 3 (three) times daily with meals., Starting Tue 06/19/2017, Normal      CONTINUE these medications which have CHANGED   Details  amiodarone (PACERONE) 400 MG tablet Take 1 tablet (400 mg total) by mouth 2 (two) times daily., Starting Tue 06/19/2017, Normal    famotidine (PEPCID) 20 MG tablet Take 1 tablet (20 mg total) by mouth at bedtime., Starting Tue 06/19/2017, Normal    levothyroxine (SYNTHROID, LEVOTHROID) 100 MCG tablet Take 1 tablet (100 mcg total) by mouth daily before breakfast., Starting Tue 06/19/2017, Normal    midodrine (PROAMATINE) 10 MG tablet Take 1 tablet (10 mg total) by mouth 3 (three) times daily with meals., Starting Tue  06/19/2017, Normal    nystatin (MYCOSTATIN) 100000 UNIT/ML suspension Take 5 mLs (500,000 Units total) by mouth 4 (four) times daily., Starting Tue 06/19/2017, Normal    polyethylene glycol (MIRALAX / GLYCOLAX) packet Take 17 g by mouth daily as needed. For constipation, Starting Tue 06/19/2017, Normal    sertraline (ZOLOFT) 25 MG tablet Take 1 tablet (25 mg total) by mouth daily., Starting Tue 06/19/2017, Normal      CONTINUE these medications which have NOT CHANGED   Details  ALPRAZolam (XANAX) 0.25 MG tablet Take 0.25 mg by mouth at bedtime as needed for anxiety., Historical Med       Follow-up Information  Tracie Harrier, MD Follow up on 06/29/2017.   Specialty:  Internal Medicine Why:  @ 1:30 pm (hospital follow up appt) Contact information: [place] [address] [phone]        [name], MD Follow up.   Specialty:  Physical Medicine and Rehabilitation Why:  office will call you with follow up appointment Contact information: [address] [place] [address] [phone]        Wallace Going, DO. Call in 1 day(s).   Specialty:  Plastic Surgery Why:  for follow up appointment in 1 week (left leg) Contact information: [place] [address] [phone]        Minna Merritts, MD. Call in 1 day(s).   Specialty:  Cardiology Why:  for follow up appointment in 2 weeks Contact information: [place] [address] [phone]        [facility] Follow up on 07/04/2017.   Why:  Dr. Juliene Pina Dove--be there at 2:10 pm for 2:20 pm appt (note that your appt will be rescheduled if you are 20 minutes late) Contact information: [place] [phone]          Signed: Bary Leriche 06/19/2017, 5:28 PM

## 2017-06-19 NOTE — Procedures (Signed)
Patient seen and examined on Hemodialysis. QB 350, UF goal 2L  IDH still an issue.  Will change orders to the following:  3hr 30 min 2K/2.25 Ca F180 dialyzer BFR 400 mL/ min DFR 800 mL/ min Temperature 36 degrees C UF profile 4  Midodrine given just prior to treatment.  She may need an intra-dialytic dose as well.  Treatment adjusted as needed.  Madelon Lips MD [facility] pgr [phone] 7:42 AM

## 2017-06-19 NOTE — Progress Notes (Signed)
Social Work  Discharge Note  The overall goal for the admission was met for:   Discharge location: Yes - home with spouse who will provide 24/7 assistance.  Length of Stay: No  -did not meet original estimate due to return to acute.  LOS at re-admit = 13 days  Discharge activity level: Yes - moderate assist overall  Home/community participation: Yes  Services provided included: MD, RD, PT, OT, SLP, RN, TR, Pharmacy, [place]: [facility] Medicare  Follow-up services arranged: Home Health: RN, PT, OT via [facility], DME: 18x18 lighweight w/c, Ulice Dash 2 cushion, hospital bed, wide drop arm commode and 30 transfer board via [place] and Patient/Family has no preference for HH/DME agencies  Comments (or additional information):  Patient/Family verbalized understanding of follow-up arrangements: Yes  Individual responsible for coordination of the follow-up plan: pt  Confirmed correct DME delivered: Binnie Vonderhaar 06/19/2017    Keana Dueitt

## 2017-06-19 NOTE — Progress Notes (Signed)
[facility]IT[facility]     PROGRESS NOTE  Subjective/Complaints:  Pt seen laying in bed this AM.  She slept well overnight and states she had her dressings changed recently. She is ready for discharge.   ROS: Denies CP, SOB, N/V/D.  Objective: Vital Signs: Blood pressure (!) 105/58, pulse (!) 57, temperature 98 F (36.7 C), temperature source Oral, resp. rate 16, weight 71.7 kg (158 lb 1.1 oz), SpO2 97 %. No results found.  Recent Labs  06/19/17 0638  WBC 14.4*  HGB 10.8*  HCT 34.5*  PLT 290    Recent Labs  06/19/17 0639  NA 134*  K 3.7  CL 98*  GLUCOSE 73  BUN 31*  CREATININE 4.65*  CALCIUM 8.3*   CBG (last 3)   Recent Labs  06/16/17 2114  GLUCAP 88    Wt Readings from Last 3 Encounters:  06/19/17 71.7 kg (158 lb 1.1 oz)  06/05/17 72.5 kg (159 lb 13.3 oz)  06/02/17 64.5 kg (142 lb 3.2 oz)    Physical Exam:  BP (!) 105/58 (BP Location: Right Arm)   Pulse (!) 57   Temp 98 F (36.7 C) (Oral)   Resp 16   Wt 71.7 kg (158 lb 1.1 oz)   SpO2 97%   BMI 26.30 kg/m  Constitutional: She appears well-developed and well-nourished. No distress.  HENT: Normocephalic and atraumatic.  Eyes: EOMI. No discharge.  Cardiovascular: IRIR. No JVD  Respiratory: Effort normal and breath sounds normal.  GI: Bowel sounds are normal. She exhibits no distension. Musculoskeletal: She exhibits no edema or tenderness.  Neurological: She is alert and oriented.  Motor: B/l UE: 4+/5 proximal to distal B/l LE: HF 3-/5, KE 3/5, ADF/PF 3+/5 (stable) Skin: Skin is warm and dry. She is not diaphoretic.  LLE with ulcer and dressing clean.   Sacral ulcer, not examined today. Psychiatric: She has a normal mood and affect. Her behavior is normal. Judgment and thought content normal.   Assessment/Plan: 1. 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 Physiatrist is  providing close team supervision and 24 hour management of active medical problems listed below. Physiatrist and rehab team continue to assess barriers to discharge/monitor patient progress toward functional and medical goals.  Function:  Bathing Bathing position   Position: Bed (LB in bed)  Bathing parts Body parts bathed by patient: Right upper leg, Left upper leg, Right lower leg, Buttocks, Front perineal area, Right arm, Left arm, Chest, Abdomen Body parts bathed by helper: Back  Bathing assist Assist Level: Touching or steadying assistance(Pt > 75%)   Set up : To obtain items  Upper Body Dressing/Undressing Upper body dressing   What is the patient wearing?: Pull over shirt/dress     Pull over shirt/dress - Perfomed by patient: Thread/unthread right sleeve, Thread/unthread left sleeve, Put head through opening, Pull shirt over trunk Pull over shirt/dress - Perfomed by helper: Pull shirt over trunk        Upper body assist Assist Level: Supervision or verbal cues   Set up : To obtain clothing/put away  Lower Body Dressing/Undressing Lower body dressing   What is the patient wearing?: Pants, Non-skid slipper socks     Pants- Performed by patient: Thread/unthread right pants leg, Thread/unthread left pants leg, Pull pants up/down Pants- Performed by helper: Thread/unthread left pants leg Non-skid slipper socks- Performed by patient: Don/doff right sock Non-skid slipper socks- Performed by helper:  Don/doff left sock                  Lower body assist Assist for lower body dressing: Touching or steadying assistance (Pt > 75%)      Toileting Toileting     Toileting steps completed by helper: Adjust clothing prior to toileting, Performs perineal hygiene, Adjust clothing after toileting    Toileting assist Assist level:  (Total Assist)   Transfers Chair/bed transfer Chair/bed transfer activity did not occur: Safety/medical concerns (incontinent of bowel, time  contraints) Chair/bed transfer method: Lateral scoot Chair/bed transfer assist level: Moderate assist (Pt 50 - 74%/lift or lower) Chair/bed transfer assistive device: Sliding board     Locomotion Ambulation Ambulation activity did not occur: Safety/medical concerns (unable due to weakness)   Max distance: 5 Assist level: 2 helpers   Oceanographer activity did not occur: Safety/medical concerns (incontinent of bowel; time constraints) Type: Manual Max wheelchair distance: 150 Assist Level: Supervision or verbal cues (with rest break)  Cognition Comprehension Comprehension assist level: Understands complex 90% of the time/cues 10% of the time  Expression Expression assist level: Expresses complex 90% of the time/cues < 10% of the time  Social Interaction Social Interaction assist level: Interacts appropriately with others with medication or extra time (anti-anxiety, antidepressant).  Problem Solving Problem solving assist level: Solves basic 90% of the time/requires cueing < 10% of the time  Memory Memory assist level: Recognizes or recalls 75 - 89% of the time/requires cueing 10 - 24% of the time    Medical Problem List and Plan: 1.  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  D/c today  Will see patient in 1-2 weeks for transitional care management.  2.  DVT Prophylaxis/Anticoagulation: Pharmaceutical: Cont Eliquis  3. Pain Management: tylenol prn  4. Mood: Remains motivated. LCSW to follow for support.  5. Neuropsych: This patient is not fully capable of making decisions on her own behalf. 6. Skin/Wound Care: Maintain adequate nutritional status to help promote healing.   VAC d/ced, spoke with PRS PA, MD ?daily evaluation per pt, cont dressing changes  Hydrotherapy for sacral ulcer  Sharp debridement performed 9/11. 7. Fluids/Electrolytes/Nutrition: Monitor I/Os. Continue megace to help with appetite. On cardiac diet to help with food  choices--add 1200 cc/FR 8. A fib with RVR: To continue amiodarone at 400 mg bid and metoprolol 50 mg bid.  9. CKD, now dialysis dependent: AVG postponed for now. HD dependent--Tue/Thu/Sat schedule at end of day to help with tolerance of therapy. 10. Hypotension: On amiodarone 10 mg tid.  Relatively controlled 9/11 11. Anemia of chronic disease: IV iron for iron deficiency. On aranesp weekly.  Labs with HD  Hb 10.8 on 9/11 12. Left foot wound: Wound VAC over Acell for healing--dressing changes weekly by plastics.  13. Anxiety/depression: Team to provide ego support.  Continue Seroquel and Zoloft.  14. Constipation: Continue miralax and linzess. Increase senna S to bid.  15. Neutropenia: Resolved  Cont to monitor  Consulted Heme/Onc due to neutropenia and previous transvaginal U/S concerning for endometrial CA  CT reviewed, limited, but relatively unremarkable for abdominal pathology  Heme/Onc singed off after spontaneous resolution 16. Leukocytosis  WBCs 14.4 on 9/11  Will attempt to collect UA/Ucx prior to d/c, however, has been labile due to chronic illness  Afebrile  Will need to follow up as outpt  LOS (Days) 13 A FACE TO FACE EVALUATION WAS PERFORMED  Ankit Lorie Phenix 06/19/2017 10:33 AM

## 2017-06-20 ENCOUNTER — Telehealth: Payer: Self-pay | Admitting: *Deleted

## 2017-06-20 NOTE — Telephone Encounter (Signed)
Rec'd incoming fax from [facility] stating prior authorization for megestrol AC tab40mg  was Approved until 10/08/2017

## 2017-06-21 ENCOUNTER — Telehealth: Payer: Self-pay | Admitting: *Deleted

## 2017-06-21 NOTE — Telephone Encounter (Signed)
Transitional care call completed, appointment confirmed, address confirmed, new patient packet mailed  Transitional Care Questions   Questions for our staff to ask patients on Transitional care 48 hour phone call:   1. Are you/is patient experiencing any problems since coming home? The only major problem is transfers.  Are there any questions regarding any aspect of care? Not yet  2. Are there any questions regarding medications administration/dosing? No Are meds being taken as prescribed? Yes Patient should review meds with caller to confirm   3. Have there been any falls? No  4. Has Home Health been to the house and/or have they contacted you? They called and left a message, visit has not been established If not, have you tried to contact them? Can we help you contact them?   5. Are bowels and bladder emptying properly? Constipation, miralax has been purchased, no urination yet Are there any unexpected incontinence issues? No If applicable, is patient following bowel/bladder programs?   6. Any fevers, problems with breathing, unexpected pain? No, No, No  7. Are there any skin problems or new areas of breakdown?  No 8. Has the patient/family member arranged specialty MD follow up (ie cardiology/neurology/renal/surgical/etc)? Specialty care has been arranged, cardiology appointment is further out than they would like. Can we help arrange? No   9. Does the patient need any other services or support that we can help arrange? Not sure yet  10. Are caregivers following through as expected in assisting the patient? Husband is sole caregiver. Little bit overwhelmed. I advised speaking with HHRN about [facility] aide when the nurse comes to visit  11. Has the patient quit smoking, drinking alcohol, or using drugs as recommended? No smoke, No drink, No illicit drug use

## 2017-06-27 ENCOUNTER — Encounter: Payer: Self-pay | Admitting: Physical Medicine & Rehabilitation

## 2017-06-27 ENCOUNTER — Encounter: Payer: Medicare Other | Attending: Physical Medicine & Rehabilitation | Admitting: Physical Medicine & Rehabilitation

## 2017-06-27 VITALS — BP 88/59 | HR 58 | Resp 14

## 2017-06-27 DIAGNOSIS — Z992 Dependence on renal dialysis: Secondary | ICD-10-CM | POA: Diagnosis not present

## 2017-06-27 DIAGNOSIS — G7281 Critical illness myopathy: Secondary | ICD-10-CM | POA: Diagnosis present

## 2017-06-27 DIAGNOSIS — F419 Anxiety disorder, unspecified: Secondary | ICD-10-CM | POA: Diagnosis not present

## 2017-06-27 DIAGNOSIS — I120 Hypertensive chronic kidney disease with stage 5 chronic kidney disease or end stage renal disease: Secondary | ICD-10-CM | POA: Insufficient documentation

## 2017-06-27 DIAGNOSIS — F330 Major depressive disorder, recurrent, mild: Secondary | ICD-10-CM | POA: Diagnosis not present

## 2017-06-27 DIAGNOSIS — N186 End stage renal disease: Secondary | ICD-10-CM | POA: Insufficient documentation

## 2017-06-27 DIAGNOSIS — L89313 Pressure ulcer of right buttock, stage 3: Secondary | ICD-10-CM | POA: Diagnosis not present

## 2017-06-27 DIAGNOSIS — N184 Chronic kidney disease, stage 4 (severe): Secondary | ICD-10-CM | POA: Diagnosis not present

## 2017-06-27 DIAGNOSIS — I482 Chronic atrial fibrillation, unspecified: Secondary | ICD-10-CM

## 2017-06-27 DIAGNOSIS — E039 Hypothyroidism, unspecified: Secondary | ICD-10-CM | POA: Diagnosis not present

## 2017-06-27 DIAGNOSIS — F329 Major depressive disorder, single episode, unspecified: Secondary | ICD-10-CM | POA: Insufficient documentation

## 2017-06-27 DIAGNOSIS — L97922 Non-pressure chronic ulcer of unspecified part of left lower leg with fat layer exposed: Secondary | ICD-10-CM

## 2017-06-27 DIAGNOSIS — I4891 Unspecified atrial fibrillation: Secondary | ICD-10-CM | POA: Insufficient documentation

## 2017-06-27 DIAGNOSIS — R269 Unspecified abnormalities of gait and mobility: Secondary | ICD-10-CM | POA: Insufficient documentation

## 2017-06-27 NOTE — Progress Notes (Signed)
Subjective:    Patient ID: [name], female    DOB: 01/30/1951, 66 y.o.   MRN: [id number]  HPI 66 y.o. female with history of HTN, A fib presents for transitional care management after receiving CIR for CIM.  Admit date: 06/06/2017 Discharge date: 06/19/2017  At discharge, she as instructed to clean wounds, which she has been.  She has been follow up PRS for LLE.  She has been limiting her fluids.  She states she is complaint with pressure relief.  Patient is poor historian.  She saw her PCP. She has appointment with [facility].  Depression meds being adjusted by PCP. Denies fevers/chills.  Husband feeling caregiver fatigue.   Therapies: 2/week Mobility: Wheelchair at all times DME: Hospital bed, transfer board, bedside commode (no seat)  Pain Inventory Average Pain 0 Pain Right Now 0 My pain is no pain  In the last 24 hours, has pain interfered with the following? General activity 0 Relation with others 0 Enjoyment of life 0 What TIME of day is your pain at its worst? no pain Sleep (in general) Good  Pain is worse with: no pain Pain improves with: no pain Relief from Meds: no pain  Mobility ability to climb steps?  no do you drive?  no use a wheelchair needs help with transfers  Function retired  Neuro/Psych weakness anxiety  Prior Studies transitional care  Physicians involved in your care transitional care   Family History  Problem Relation Age of Onset  . AAA (abdominal aortic aneurysm) Mother    Social History   Social History  . Marital status: Married    Spouse name: N/A  . Number of children: N/A  . Years of education: N/A   Social History Main Topics  . Smoking status: Never Smoker  . Smokeless tobacco: Never Used  . Alcohol use No  . Drug use: No  . Sexual activity: Not Currently   Other Topics Concern  . None   Social History Narrative  . None   Past Surgical History:  Procedure Laterality Date  . APPLICATION OF  A-CELL OF EXTREMITY Left 05/28/2017   Procedure: APPLICATION OF A-CELL OF LEFT LOWER EXTREMITY;  Surgeon: Wallace Going, DO;  Location: [place];  Service: Plastics;  Laterality: Left;  . APPLICATION OF WOUND VAC Left 05/28/2017   Procedure: APPLICATION OF WOUND VAC;  Surgeon: Wallace Going, DO;  Location: [facility];  Service: Plastics;  Laterality: Left;  . DIALYSIS/PERMA CATHETER INSERTION N/A 04/26/2017   Procedure: Dialysis/Perma Catheter Insertion;  Surgeon: Algernon Huxley, MD;  Location: [facility];  Service: Cardiovascular;  Laterality: N/A;  . INCISION AND DRAINAGE OF WOUND Left 05/28/2017   Procedure: IRRIGATION AND DEBRIDEMENT LEFT LOWER LEG WOUND;  Surgeon: Wallace Going, DO;  Location: [place];  Service: Plastics;  Laterality: Left;  . PR DEBRIDEMENT, SKIN, SUB-Q TISSUE,=<20 SQ CM  05/21/2017      . PR DEBRIDEMENT, SKIN, SUB-Q TISSUE,EACH ADD 20 SQ CM  05/21/2017      . WRIST ARTHROSCOPY     Past Medical History:  Diagnosis Date  . A-fib ([place])    on pradaxa  . Chronic kidney disease   . Dysrhythmia   . Hypertension   . Hypothyroidism   . Thyroid disease    BP (!) 88/59 (BP Location: Right Arm, Patient Position: Sitting, Cuff Size: Normal)   Pulse (!) 58   Resp 14   SpO2 98%   Opioid Risk Score:  Fall Risk Score:  `1  Depression screen PHQ 2/9  Depression screen PHQ 2/9 06/27/2017  Decreased Interest 0  Down, Depressed, Hopeless 2  PHQ - 2 Score 2  Altered sleeping 0  Tired, decreased energy 2  Change in appetite 3  Feeling bad or failure about yourself  1  Trouble concentrating 2  Moving slowly or fidgety/restless 1  Suicidal thoughts 0  PHQ-9 Score 11  Difficult doing work/chores Extremely dIfficult    Review of Systems  HENT: Negative.   Eyes: Negative.   Respiratory: Negative.   Cardiovascular: Negative.   Gastrointestinal: Positive for constipation.  Endocrine: Negative.   Genitourinary: Negative.   Musculoskeletal: Negative.     Skin: Negative.   Allergic/Immunologic: Negative.   Neurological: Positive for weakness.  Hematological: Negative.   Psychiatric/Behavioral: Negative.   All other systems reviewed and are negative.     Objective:   Physical Exam Constitutional: She appears well-developed and well-nourished. No distress.  HENT: Normocephalic and atraumatic.  Eyes: EOMI. No discharge.  Cardiovascular: IRIR. No JVD  Respiratory: Effort normal and breath sounds normal.  GI: Bowel sounds are normal. She exhibits no distension. Musculoskeletal: She exhibits no edema or tenderness.  Neurological: She is alert and oriented.  Motor: B/l UE: 4+/5 proximal to distal RLE: HF 2+/5, KE 3/5, ADF/PF 3+/5  RLE: HF 2+/5, KE 2+/5, ADF/PF 3-/5  Skin: Skin is warm and dry. She is not diaphoretic.  LLE with ulcer and dressing clean.   Sacral ulcer, not examined. Psychiatric: She has a normal mood and affect. Her behavior is normal. Judgment and thought content normal.      Assessment & Plan:  66 y.o. female with history of HTN, A fib presents for transitional care management after receiving CIR for CIM.   1.  CIM  Cont therapies  2. Skin/Wound Care  Cont dressing changes, pressure relief to sacrum  Cont follow up with PRS  3. A fib  Cont meds  Cont follow up with [facility]  4. CKD, now dialysis dependent  Cont HD  Cont recs per Neprho  5. Anxiety/depression:   Cont meds, being adjusted by PCP  6. Emdometrial mass  Follow up with [facility]  7. Gait abnormality  Cont therapies  Cont walker for safety  Meds reviewed Referrals reviewed All questions answered

## 2017-06-28 ENCOUNTER — Telehealth: Payer: Self-pay | Admitting: Physical Medicine & Rehabilitation

## 2017-06-28 NOTE — Telephone Encounter (Signed)
Called connie back and approved verbal orders

## 2017-06-28 NOTE — Telephone Encounter (Signed)
CONNIE WITH [facility] VERBAL ORDERS FOR OT 2 X A WEEK X 8 WEEKS PLEASE CALL TO CONFIRM IF APPROVED

## 2017-06-28 NOTE — Telephone Encounter (Signed)
[name] with [facility] needs to get a verbal order to see patient 2W8.  Please call her at [phone].

## 2017-06-28 NOTE — Telephone Encounter (Signed)
Called [name] back and approved verbal orders

## 2017-07-04 ENCOUNTER — Encounter: Payer: Self-pay | Admitting: Obstetrics & Gynecology

## 2017-07-04 ENCOUNTER — Ambulatory Visit (INDEPENDENT_AMBULATORY_CARE_PROVIDER_SITE_OTHER): Payer: Medicare Other | Admitting: Obstetrics & Gynecology

## 2017-07-04 VITALS — BP 122/96 | HR 117

## 2017-07-04 DIAGNOSIS — N950 Postmenopausal bleeding: Secondary | ICD-10-CM

## 2017-07-04 MED ORDER — MISOPROSTOL 200 MCG PO TABS: ORAL_TABLET | ORAL | 0 refills | Status: DC

## 2017-07-04 NOTE — Progress Notes (Signed)
   Subjective:    Patient ID: [name], female    DOB: Feb 18, 1951, 66 y.o.   MRN: [id number]  HPI  66 yo MW G0 here for PMB. This started about a month ago. An u/s showed a 3.7 cm endometrium.   Review of Systems     Objective:   Physical Exam Breathing, conversing  Normally She is wheelchair-bound     Assessment & Plan:  PMB- plan for [facility] Pretreat with cytotec

## 2017-07-04 NOTE — Progress Notes (Signed)
PHQ-9 score is high. Patient is seeing PCP for this.

## 2017-07-06 ENCOUNTER — Inpatient Hospital Stay
Admission: EM | Admit: 2017-07-06 | Discharge: 2017-07-13 | DRG: 377 | Disposition: A | Discharge status: Skilled Nursing Facility | Payer: Medicare Other | Attending: Internal Medicine | Admitting: Internal Medicine

## 2017-07-06 ENCOUNTER — Encounter: Payer: Self-pay | Admitting: Emergency Medicine

## 2017-07-06 DIAGNOSIS — Z86718 Personal history of other venous thrombosis and embolism: Secondary | ICD-10-CM

## 2017-07-06 DIAGNOSIS — K31811 Angiodysplasia of stomach and duodenum with bleeding: Secondary | ICD-10-CM

## 2017-07-06 DIAGNOSIS — I951 Orthostatic hypotension: Secondary | ICD-10-CM | POA: Diagnosis present

## 2017-07-06 DIAGNOSIS — R001 Bradycardia, unspecified: Secondary | ICD-10-CM | POA: Diagnosis present

## 2017-07-06 DIAGNOSIS — Z79818 Long term (current) use of other agents affecting estrogen receptors and estrogen levels: Secondary | ICD-10-CM

## 2017-07-06 DIAGNOSIS — K921 Melena: Secondary | ICD-10-CM

## 2017-07-06 DIAGNOSIS — Z992 Dependence on renal dialysis: Secondary | ICD-10-CM

## 2017-07-06 DIAGNOSIS — E43 Unspecified severe protein-calorie malnutrition: Secondary | ICD-10-CM | POA: Insufficient documentation

## 2017-07-06 DIAGNOSIS — D631 Anemia in chronic kidney disease: Secondary | ICD-10-CM | POA: Diagnosis present

## 2017-07-06 DIAGNOSIS — A0472 Enterocolitis due to Clostridium difficile, not specified as recurrent: Secondary | ICD-10-CM | POA: Diagnosis present

## 2017-07-06 DIAGNOSIS — Z66 Do not resuscitate: Secondary | ICD-10-CM | POA: Diagnosis present

## 2017-07-06 DIAGNOSIS — I5042 Chronic combined systolic (congestive) and diastolic (congestive) heart failure: Secondary | ICD-10-CM | POA: Diagnosis present

## 2017-07-06 DIAGNOSIS — M7989 Other specified soft tissue disorders: Secondary | ICD-10-CM

## 2017-07-06 DIAGNOSIS — R627 Adult failure to thrive: Secondary | ICD-10-CM | POA: Diagnosis present

## 2017-07-06 DIAGNOSIS — D509 Iron deficiency anemia, unspecified: Secondary | ICD-10-CM | POA: Diagnosis present

## 2017-07-06 DIAGNOSIS — Z79899 Other long term (current) drug therapy: Secondary | ICD-10-CM

## 2017-07-06 DIAGNOSIS — Z8674 Personal history of sudden cardiac arrest: Secondary | ICD-10-CM | POA: Diagnosis not present

## 2017-07-06 DIAGNOSIS — I132 Hypertensive heart and chronic kidney disease with heart failure and with stage 5 chronic kidney disease, or end stage renal disease: Secondary | ICD-10-CM | POA: Diagnosis present

## 2017-07-06 DIAGNOSIS — Z515 Encounter for palliative care: Secondary | ICD-10-CM

## 2017-07-06 DIAGNOSIS — R441 Visual hallucinations: Secondary | ICD-10-CM | POA: Diagnosis present

## 2017-07-06 DIAGNOSIS — D62 Acute posthemorrhagic anemia: Secondary | ICD-10-CM | POA: Diagnosis present

## 2017-07-06 DIAGNOSIS — E039 Hypothyroidism, unspecified: Secondary | ICD-10-CM | POA: Diagnosis present

## 2017-07-06 DIAGNOSIS — I480 Paroxysmal atrial fibrillation: Secondary | ICD-10-CM | POA: Diagnosis present

## 2017-07-06 DIAGNOSIS — N186 End stage renal disease: Secondary | ICD-10-CM | POA: Diagnosis present

## 2017-07-06 DIAGNOSIS — E876 Hypokalemia: Secondary | ICD-10-CM | POA: Diagnosis present

## 2017-07-06 DIAGNOSIS — I82409 Acute embolism and thrombosis of unspecified deep veins of unspecified lower extremity: Secondary | ICD-10-CM

## 2017-07-06 DIAGNOSIS — K625 Hemorrhage of anus and rectum: Secondary | ICD-10-CM | POA: Diagnosis present

## 2017-07-06 DIAGNOSIS — Z681 Body mass index (BMI) 19 or less, adult: Secondary | ICD-10-CM

## 2017-07-06 DIAGNOSIS — L89153 Pressure ulcer of sacral region, stage 3: Secondary | ICD-10-CM | POA: Diagnosis present

## 2017-07-06 DIAGNOSIS — K2970 Gastritis, unspecified, without bleeding: Secondary | ICD-10-CM | POA: Diagnosis present

## 2017-07-06 DIAGNOSIS — N2581 Secondary hyperparathyroidism of renal origin: Secondary | ICD-10-CM | POA: Diagnosis present

## 2017-07-06 DIAGNOSIS — Z7901 Long term (current) use of anticoagulants: Secondary | ICD-10-CM

## 2017-07-06 DIAGNOSIS — D649 Anemia, unspecified: Secondary | ICD-10-CM | POA: Diagnosis present

## 2017-07-06 HISTORY — DX: End stage renal disease: N18.6

## 2017-07-06 LAB — TYPE AND SCREEN
ABO/RH(D): O POS
ANTIBODY SCREEN: NEGATIVE

## 2017-07-06 LAB — COMPREHENSIVE METABOLIC PANEL
ALK PHOS: 183 U/L — AB (ref 38–126)
ALT: 34 U/L (ref 14–54)
ANION GAP: 14 (ref 5–15)
AST: 41 U/L (ref 15–41)
Albumin: 2.5 g/dL — ABNORMAL LOW (ref 3.5–5.0)
BILIRUBIN TOTAL: 1.4 mg/dL — AB (ref 0.3–1.2)
BUN: 27 mg/dL — AB (ref 6–20)
CO2: 27 mmol/L (ref 22–32)
CREATININE: 3.02 mg/dL — AB (ref 0.44–1.00)
Calcium: 8.1 mg/dL — ABNORMAL LOW (ref 8.9–10.3)
Chloride: 99 mmol/L — ABNORMAL LOW (ref 101–111)
GFR calc non Af Amer: 15 mL/min — ABNORMAL LOW (ref >60)
GFR, EST AFRICAN AMERICAN: 18 mL/min — AB (ref >60)
GLUCOSE: 86 mg/dL (ref 65–99)
Potassium: 2.5 mmol/L — CL (ref 3.5–5.1)
SODIUM: 140 mmol/L (ref 135–145)
TOTAL PROTEIN: 5.4 g/dL — AB (ref 6.5–8.1)

## 2017-07-06 LAB — CBC
HCT: 29 % — ABNORMAL LOW (ref 35.0–47.0)
Hemoglobin: 9.9 g/dL — ABNORMAL LOW (ref 12.0–16.0)
MCH: 35.7 pg — ABNORMAL HIGH (ref 26.0–34.0)
MCHC: 34 g/dL (ref 32.0–36.0)
MCV: 105.1 fL — AB (ref 80.0–100.0)
PLATELETS: 225 10*3/uL (ref 150–440)
RBC: 2.76 MIL/uL — ABNORMAL LOW (ref 3.80–5.20)
RDW: 20.3 % — AB (ref 11.5–14.5)
WBC: 10.8 10*3/uL (ref 3.6–11.0)

## 2017-07-06 LAB — IRON AND TIBC
Iron: 74 ug/dL (ref 28–170)
Saturation Ratios: 55 % — ABNORMAL HIGH (ref 10.4–31.8)
TIBC: 136 ug/dL — ABNORMAL LOW (ref 250–450)
UIBC: 62 ug/dL

## 2017-07-06 LAB — RETICULOCYTES
RBC.: 2.69 MIL/uL — ABNORMAL LOW (ref 3.80–5.20)
Retic Count, Absolute: 48.4 10*3/uL (ref 19.0–183.0)
Retic Ct Pct: 1.8 % (ref 0.4–3.1)

## 2017-07-06 LAB — HEMOGLOBIN: HEMOGLOBIN: 10.3 g/dL — AB (ref 12.0–16.0)

## 2017-07-06 LAB — FOLATE: Folate: 63.4 ng/mL (ref >5.9)

## 2017-07-06 LAB — MRSA PCR SCREENING: MRSA by PCR: NEGATIVE

## 2017-07-06 LAB — PROTIME-INR
INR: 2.52
Prothrombin Time: 27 seconds — ABNORMAL HIGH (ref 11.4–15.2)

## 2017-07-06 LAB — FERRITIN: Ferritin: 2535 ng/mL — ABNORMAL HIGH (ref 11–307)

## 2017-07-06 MED ORDER — ONDANSETRON HCL 4 MG/2ML IJ SOLN: 4.0000 mg | Freq: Four times a day (QID) | INTRAMUSCULAR | Status: DC | PRN

## 2017-07-06 MED ORDER — ACETAMINOPHEN 325 MG PO TABS: 650.0000 mg | ORAL_TABLET | Freq: Four times a day (QID) | ORAL | Status: DC | PRN

## 2017-07-06 MED ORDER — RENA-VITE PO TABS
1.0000 | ORAL_TABLET | Freq: Every day | ORAL | Status: DC
  Administered 2017-07-06 – 2017-07-12 (×6): 1 via ORAL
  Filled 2017-07-06 (×7): qty 1

## 2017-07-06 MED ORDER — MIDODRINE HCL 5 MG PO TABS
10.0000 mg | ORAL_TABLET | Freq: Three times a day (TID) | ORAL | Status: DC
  Administered 2017-07-07 – 2017-07-13 (×13): 10 mg via ORAL
  Filled 2017-07-06 (×23): qty 2

## 2017-07-06 MED ORDER — NEPRO/CARBSTEADY PO LIQD
237.0000 mL | Freq: Three times a day (TID) | ORAL | Status: DC
  Administered 2017-07-07 – 2017-07-08 (×4): 237 mL via ORAL

## 2017-07-06 MED ORDER — POTASSIUM CHLORIDE CRYS ER 20 MEQ PO TBCR
40.0000 meq | EXTENDED_RELEASE_TABLET | ORAL | Status: AC
  Administered 2017-07-06 (×2): 40 meq via ORAL
  Filled 2017-07-06 (×2): qty 2

## 2017-07-06 MED ORDER — QUETIAPINE FUMARATE 25 MG PO TABS
50.0000 mg | ORAL_TABLET | Freq: Every day | ORAL | Status: DC
  Administered 2017-07-06 – 2017-07-12 (×6): 50 mg via ORAL
  Filled 2017-07-06 (×7): qty 2

## 2017-07-06 MED ORDER — ONDANSETRON HCL 4 MG PO TABS: 4.0000 mg | ORAL_TABLET | Freq: Four times a day (QID) | ORAL | Status: DC | PRN

## 2017-07-06 MED ORDER — PANTOPRAZOLE SODIUM 40 MG IV SOLR
40.0000 mg | Freq: Two times a day (BID) | INTRAVENOUS | Status: DC
  Administered 2017-07-06 – 2017-07-13 (×14): 40 mg via INTRAVENOUS
  Filled 2017-07-06 (×14): qty 40

## 2017-07-06 MED ORDER — SUCROFERRIC OXYHYDROXIDE 500 MG PO CHEW
500.0000 mg | CHEWABLE_TABLET | Freq: Three times a day (TID) | ORAL | Status: DC
  Administered 2017-07-07 – 2017-07-10 (×6): 500 mg via ORAL
  Filled 2017-07-06 (×12): qty 1

## 2017-07-06 MED ORDER — ALPRAZOLAM 0.25 MG PO TABS
0.2500 mg | ORAL_TABLET | Freq: Every evening | ORAL | Status: DC | PRN
  Administered 2017-07-08: 0.25 mg via ORAL
  Filled 2017-07-06: qty 1

## 2017-07-06 MED ORDER — AMIODARONE HCL 200 MG PO TABS
200.0000 mg | ORAL_TABLET | Freq: Two times a day (BID) | ORAL | Status: DC
  Filled 2017-07-06 (×2): qty 1

## 2017-07-06 MED ORDER — LEVOTHYROXINE SODIUM 100 MCG PO TABS
100.0000 ug | ORAL_TABLET | Freq: Every day | ORAL | Status: DC
  Administered 2017-07-07 – 2017-07-13 (×5): 100 ug via ORAL
  Filled 2017-07-06 (×5): qty 1

## 2017-07-06 MED ORDER — MEGESTROL ACETATE 20 MG PO TABS
40.0000 mg | ORAL_TABLET | Freq: Two times a day (BID) | ORAL | Status: DC
  Administered 2017-07-06 – 2017-07-13 (×13): 40 mg via ORAL
  Filled 2017-07-06 (×11): qty 2
  Filled 2017-07-06: qty 1
  Filled 2017-07-06 (×3): qty 2

## 2017-07-06 MED ORDER — ACETAMINOPHEN 650 MG RE SUPP: 650.0000 mg | Freq: Four times a day (QID) | RECTAL | Status: DC | PRN

## 2017-07-06 NOTE — ED Triage Notes (Signed)
EMS reports that pt takes a pill that the nurse puts in apple sauce that is the brown, she has had dark stools, but they have noticed that her stools have been even darker than normal.

## 2017-07-06 NOTE — ED Notes (Signed)
Awake alert and oriented to the floor.

## 2017-07-06 NOTE — H&P (Signed)
[name]    MR#:  [id number]  DATE OF BIRTH:  [birth year]  DATE OF ADMISSION:  07/06/2017  PRIMARY CARE PHYSICIAN: Tracie Harrier, MD   REQUESTING/REFERRING PHYSICIAN: Dr. Delman Kitten  CHIEF COMPLAINT:   Chief Complaint  Patient presents with  . Rectal Bleeding    HISTORY OF PRESENT ILLNESS:  [name]  is a 66 y.o. female with a known history of atrial fibrillation on eliquis, end-stage renal disease on Tuesday, Thursday and Saturday hemodialysis, hypertension presents to the hospital from home secondary to hypotension and weakness. Patient is a poor historian, most of the history is obtained from significant other at bedside. According to him, patient had a complicated hospital course about 3 months ago at which time she was started on dialysis and also had a cardiac arrest.She has recovered and went to rehabilitation twice in the last 3 months and has been home on these since the past 2 weeks. She is still in a wheelchair and physical therapy is working with her at home. She does have history of orthostatic hypotension and is started on midodrine as an outpatient. She was noted to have low blood pressure yesterday after dialysis. She did complain of some weakness, fatigue and dizziness. She has been having dark stools on and off since discharge 2 weeks ago, however they have noted to be much darker in the last couple of days. Today the home health nurse saw hard stool and asked the physical therapist to check her vitals and patient was noted to have hypotension on standing up associated with dizziness. She has a hemoglobin of 9.9 here in the emergency room. She also has significant bruising history with the anticoagulation.her potassium was noted to be 2.5 today. She is also on multivitamin with iron supplementation in them.  PAST MEDICAL HISTORY:   Past Medical History:  Diagnosis Date  . A-fib ([place])    on  eliquis  . Dysrhythmia   . ESRD (end stage renal disease) ([place])    on Tue-Thur-Sat dialysis  . Hypertension   . Hypothyroidism   . Thyroid disease     PAST SURGICAL HISTORY:   Past Surgical History:  Procedure Laterality Date  . APPLICATION OF A-CELL OF EXTREMITY Left 05/28/2017   Procedure: APPLICATION OF A-CELL OF LEFT LOWER EXTREMITY;  Surgeon: Wallace Going, DO;  Location: [facility];  Service: Plastics;  Laterality: Left;  . APPLICATION OF WOUND VAC Left 05/28/2017   Procedure: APPLICATION OF WOUND VAC;  Surgeon: Wallace Going, DO;  Location: [facility];  Service: Plastics;  Laterality: Left;  . DIALYSIS/PERMA CATHETER INSERTION N/A 04/26/2017   Procedure: Dialysis/Perma Catheter Insertion;  Surgeon: Algernon Huxley, MD;  Location: [facility];  Service: Cardiovascular;  Laterality: N/A;  . INCISION AND DRAINAGE OF WOUND Left 05/28/2017   Procedure: IRRIGATION AND DEBRIDEMENT LEFT LOWER LEG WOUND;  Surgeon: Wallace Going, DO;  Location: [facility];  Service: Plastics;  Laterality: Left;  . PR DEBRIDEMENT, SKIN, SUB-Q TISSUE,=<20 SQ CM  05/21/2017      . PR DEBRIDEMENT, SKIN, SUB-Q TISSUE,EACH ADD 20 SQ CM  05/21/2017      . WRIST ARTHROSCOPY      SOCIAL HISTORY:   Social History  Substance Use Topics  . Smoking status: Never Smoker  . Smokeless tobacco: Never Used  . Alcohol use No    FAMILY HISTORY:   Family History  Problem Relation Age  of Onset  . AAA (abdominal aortic aneurysm) Mother     DRUG ALLERGIES:  No Known Allergies  REVIEW OF SYSTEMS:   Review of Systems  Constitutional: Positive for malaise/fatigue. Negative for chills, fever and weight loss.       Poor appetite  HENT: Negative for ear discharge, ear pain, hearing loss and nosebleeds.   Eyes: Negative for blurred vision, double vision and photophobia.  Respiratory: Negative for cough, hemoptysis, shortness of breath and wheezing.   Cardiovascular: Negative for chest pain, palpitations,  orthopnea and leg swelling.  Gastrointestinal: Positive for melena. Negative for abdominal pain, constipation, diarrhea, heartburn, nausea and vomiting.  Genitourinary: Negative for dysuria and urgency.  Musculoskeletal: Negative for back pain, myalgias and neck pain.  Skin: Negative for rash.  Neurological: Positive for weakness. Negative for dizziness, sensory change, speech change, focal weakness and headaches.  Endo/Heme/Allergies: Does not bruise/bleed easily.  Psychiatric/Behavioral: Negative for depression.    MEDICATIONS AT HOME:   Prior to Admission medications   Medication Sig Start Date End Date Taking? Authorizing Provider  amiodarone (PACERONE) 400 MG tablet Take 1 tablet (400 mg total) by mouth 2 (two) times daily. 06/19/17  Yes Love, Ivan Anchors, PA-C  apixaban (ELIQUIS) 5 MG TABS tablet Take 1 tablet (5 mg total) by mouth 2 (two) times daily. 06/19/17  Yes Love, Ivan Anchors, PA-C  collagenase (SANTYL) ointment Apply topically daily. 06/20/17  Yes Love, Ivan Anchors, PA-C  famotidine (PEPCID) 20 MG tablet Take 1 tablet (20 mg total) by mouth at bedtime. 06/19/17  Yes Love, Ivan Anchors, PA-C  hydrocerin (EUCERIN) CREA Apply 1 application topically 2 (two) times daily. To dry areas on feet. 06/19/17  Yes Love, Ivan Anchors, PA-C  levothyroxine (SYNTHROID, LEVOTHROID) 100 MCG tablet Take 1 tablet (100 mcg total) by mouth daily before breakfast. 06/19/17  Yes Love, Ivan Anchors, PA-C  megestrol (MEGACE) 40 MG tablet Take 1 tablet (40 mg total) by mouth 2 (two) times daily. 06/19/17  Yes Love, Ivan Anchors, PA-C  metoprolol tartrate (LOPRESSOR) 50 MG tablet Take 1 tablet (50 mg total) by mouth 2 (two) times daily. 06/19/17  Yes Love, Ivan Anchors, PA-C  midodrine (PROAMATINE) 10 MG tablet Take 1 tablet (10 mg total) by mouth 3 (three) times daily with meals. 06/19/17  Yes Love, Ivan Anchors, PA-C  multivitamin (RENA-VIT) TABS tablet Take 1 tablet by mouth at bedtime. 06/19/17  Yes Love, Ivan Anchors, PA-C  QUEtiapine (SEROQUEL)  25 MG tablet Take 1 tablet (25 mg total) by mouth at bedtime. 06/19/17  Yes Love, Ivan Anchors, PA-C  senna-docusate (SENOKOT-S) 8.6-50 MG tablet Take 2 tablets by mouth 2 (two) times daily. 06/19/17  Yes Love, Ivan Anchors, PA-C  sertraline (ZOLOFT) 50 MG tablet Take 50 mg by mouth daily.   Yes [provider]  sucroferric oxyhydroxide (VELPHORO) 500 MG chewable tablet Chew 1 tablet (500 mg total) by mouth 3 (three) times daily with meals. 06/19/17  Yes Love, Ivan Anchors, PA-C  ALPRAZolam (XANAX) 0.25 MG tablet Take 50 mg by mouth at bedtime as needed for anxiety.     [provider]  misoprostol (CYTOTEC) 200 MCG tablet Take 3 pills by mouth the night before biopsy. Patient not taking: Reported on 07/06/2017 07/04/17   Emily Filbert, MD  nystatin (MYCOSTATIN) 100000 UNIT/ML suspension Take 5 mLs (500,000 Units total) by mouth 4 (four) times daily. Patient not taking: Reported on 07/06/2017 06/19/17   Love, Ivan Anchors, PA-C  polyethylene glycol [facility] Flor[facility] Raveling) packet Take  17 g by mouth daily as needed. For constipation 06/19/17   Love, Ivan Anchors, PA-C      VITAL SIGNS:  Blood pressure (!) 125/111, pulse (!) 48, temperature 98.7 F (37.1 C), temperature source Oral, resp. rate 20, SpO2 98 %.  PHYSICAL EXAMINATION:   Physical Exam  GENERAL:  66 y.o.-year-old patient lying in the bed with no acute distress. Disheveled appearing EYES: Pupils equal, round, reactive to light and accommodation. No scleral icterus. Extraocular muscles intact.  HEENT: Head atraumatic, normocephalic. Oropharynx and nasopharynx clear.  NECK:  Supple, no jugular venous distention. No thyroid enlargement, no tenderness.  LUNGS: Normal breath sounds bilaterally, no wheezing, rales,rhonchi or crepitation. No use of accessory muscles of respiration. Decreased bibasilar breath sounds CARDIOVASCULAR: S1, S2 normal. No murmurs, rubs, or gallops.  ABDOMEN: Soft, nontender, nondistended. Bowel sounds present. No organomegaly  or mass.  EXTREMITIES: No pedal edema, cyanosis, or clubbing. Left foot is in dressing secondary to recent plastic surgery NEUROLOGIC: Cranial nerves II through XII are intact. Muscle strength 5/5 in all extremities. Sensation intact. Gait not checked.Global weakness present  PSYCHIATRIC: The patient is alert and oriented x 3.  SKIN: No obvious rash, lesion, or ulcer. Ecchymosis noted all over her legs.  LABORATORY PANEL:   CBC  Recent Labs Lab 07/06/17 1604  WBC 10.8  HGB 9.9*  HCT 29.0*  PLT 225   ------------------------------------------------------------------------------------------------------------------  Chemistries   Recent Labs Lab 07/06/17 1604  NA 140  K 2.5*  CL 99*  CO2 27  GLUCOSE 86  BUN 27*  CREATININE 3.02*  CALCIUM 8.1*  AST 41  ALT 34  ALKPHOS 183*  BILITOT 1.4*   ------------------------------------------------------------------------------------------------------------------  Cardiac Enzymes No results for input(s): TROPONINI in the last 168 hours. ------------------------------------------------------------------------------------------------------------------  RADIOLOGY:  No results found.  EKG:   Orders placed or performed during the hospital encounter of 07/06/17  . EKG 12-Lead  . EKG 12-Lead    IMPRESSION AND PLAN:   [name]  is a 66 y.o. female with a known history of atrial fibrillation on eliquis, end-stage renal disease on Tuesday, Thursday and Saturday hemodialysis, hypertension presents to the hospital from home secondary to hypotension and weakness.  #1 orthostatic hypotension-known history of hypotension, continue midodrine. - Since bradycardic anyways, hold off metoprolol. -PT consult and monitor  #2 melena- patient is on eliquis, has been on anticoagulation for almost 7 years and was recently changed to eliquis. -Also on multivitamin pills with iron supplementation. Baseline hemoglobin seems to be around 10.4,  except 9.9 today. -Holding eliquis. Check hemoglobin every 8 hours -If stable without any further bleeding off of eliquis-can be followed up with GI as an outpatient.Next line-if blood counts drop or having active bleed, we'll get GI consult and a bleeding scan. -Started on Protonix IV  #3 atrial fibrillation- bradycardic today, remains in sinus rhythm. -Decreased the dose of amiodarone. Hold metoprolol -Hold eliquis due to possible GI bleed risk versus benefit discussed with patient and husband at bedside  #4 end-stage renal disease on hemodialysis-nephrology consulted. Patient on Tuesday, Thursday and Saturday hemodialysis. Last dialysis was yesterday  #5 hypothyroidism-continue Synthroid  #6 hypokalemia-will be replaced. We'll check in the morning prior to dialysis  #7 DVT prophylaxis-we will hold off due to GI bleed   Physical therapy consulted    All the records are reviewed and case discussed with ED provider. Management plans discussed with the patient, family and they are in agreement.  CODE STATUS: Full Code  TOTAL TIME TAKING CARE  OF THIS PATIENT: 50 minutes.    Gladstone Lighter M.D on 07/06/2017 at 5:36 PM  Between 7am to 6pm - Pager - [phone]  After 6pm go to [email or web address] - password [email or web address]  Office  [phone]  CC: Primary care physician; Tracie Harrier, MD

## 2017-07-06 NOTE — ED Notes (Signed)
Date and time results received: 07/06/17 4:36 PM  (use smartphrase ".now" to insert current time)  Test: K Critical Value: 2.5  Name of Provider Notified: Dr.Quale  Orders Received? Or Actions Taken?: Orders Received - See Orders for details

## 2017-07-06 NOTE — ED Notes (Signed)
Multiple atttempts made to establish IV access.  Unsuccessful.  Phlebotomy called to attempt to draw lab work, unsuccessful attempts.  Dr. Reita Cliche notified and to bedside to evaluate potential EJ access.  Blood work changed to arterial draw, respiratory paged. IV team consult placed.

## 2017-07-06 NOTE — ED Provider Notes (Signed)
[facility] Emergency Department Provider Note ____________________________________________   I have reviewed the triage vital signs and the triage nursing note.  HISTORY  Chief Complaint Rectal Bleeding   Historian Patient  HPI [name] is a 66 y.o. female with a history of atrial fibrillation on Pradaxa, as well as chronic kidney disease with recent diagnosis dialysis with right sided tunneled chest wall dialysis catheter, presents because physical therapy came to her house and noted that she was orthostatic and hypotensive into the 90s when she stood up. Upon further questioning patient reported that she had dark stools and was referred to come into the ER for further investigation.  Patient denies history of prior GI bleeding. She does have easy bruising all over. Husband has been caregiving for her at home and she has really been unable to get out of bed or transfer very well on her own and he is very overwhelmed taking care of her and is wondering a higher level of care.  Patient denies any pain.  She states that they always have a hard time finding an IV or blood stick for her. She has plans to have a port placed.    Past Medical History:  Diagnosis Date  . A-fib ([place])    on pradaxa  . Chronic kidney disease   . Dysrhythmia   . Hypertension   . Hypothyroidism   . Thyroid disease     Patient Active Problem List   Diagnosis Date Noted  . Skin ulcer of sacrum ([place])   . Leukopenia   . Slow transit constipation   . Acute blood loss anemia   . Splinter of foot without major open wound or infection   . Dependence on renal dialysis ([place])   . Chronic kidney disease (CKD), stage IV (severe) ([place])   . Atrial fibrillation with rapid ventricular response ([place])   . Chronic anticoagulation   . Pressure injury of skin 06/04/2017  . Chronic systolic CHF (congestive heart failure) ([place]) 06/03/2017  . Atrial fibrillation with RVR ([place]) 06/02/2017  .  ESRD (end stage renal disease) ([place])   . Vaginal discharge   . AKI (acute kidney injury) ([place])   . Bleeding   . A-fib ([place])   . Abnormal urinalysis   . Dependence on hemodialysis ([place])   . Ulcer of left lower extremity with fat layer exposed ([place])   . Skin ulcer of left foot with fat layer exposed ([place])   . Vaginal bleeding   . Hypoglycemia associated with diabetes ([place])   . Skin ulcer of right heel ([place])   . PAF (paroxysmal atrial fibrillation) ([place])   . Hypotension   . Leukocytosis   . Anemia of chronic disease   . Anxiety state   . Depression   . Prediabetes   . Stage 5 chronic kidney disease not on chronic dialysis ([place])   . 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 Pressure sore on buttocks 04/19/2017  . Acute renal failure (ARF) ([place])   . Cellulitis of lower extremity   . Edema extremities   . Acute respiratory failure ([place])   . Acute renal failure ([place])   . Multi-organ failure with heart failure ([place])   . Palliative care by specialist   . Goals of care, counseling/discussion   . Septic shock ([place]) 04/08/2017    Past Surgical History:  Procedure Laterality Date  . APPLICATION OF A-CELL OF EXTREMITY Left 05/28/2017   Procedure: APPLICATION OF A-CELL OF LEFT LOWER EXTREMITY;  Surgeon: [name],  Loel Lofty, DO;  Location: [place];  Service: Plastics;  Laterality: Left;  . APPLICATION OF WOUND VAC Left 05/28/2017   Procedure: APPLICATION OF WOUND VAC;  Surgeon: Wallace Going, DO;  Location: [facility] [place];  Service: Plastics;  Laterality: Left;  . DIALYSIS/PERMA CATHETER INSERTION N/A 04/26/2017   Procedure: Dialysis/Perma Catheter Insertion;  Surgeon: Algernon Huxley, MD;  Location: [facility];  Service: Cardiovascular;  Laterality: N/A;  . INCISION AND DRAINAGE OF WOUND Left 05/28/2017   Procedure: IRRIGATION AND DEBRIDEMENT LEFT LOWER LEG WOUND;  Surgeon: Wallace Going, DO;  Location: [facility];  Service: Plastics;  Laterality: Left;  . PR DEBRIDEMENT, SKIN, SUB-Q  TISSUE,=<20 SQ CM  05/21/2017      . PR DEBRIDEMENT, SKIN, SUB-Q TISSUE,EACH ADD 20 SQ CM  05/21/2017      . WRIST ARTHROSCOPY      Prior to Admission medications   Medication Sig Start Date End Date Taking? Authorizing Provider  ALPRAZolam (XANAX) 0.25 MG tablet Take 50 mg by mouth at bedtime as needed for anxiety.     [provider]  amiodarone (PACERONE) 400 MG tablet Take 1 tablet (400 mg total) by mouth 2 (two) times daily. 06/19/17   Love, Ivan Anchors, PA-C  apixaban (ELIQUIS) 5 MG TABS tablet Take 1 tablet (5 mg total) by mouth 2 (two) times daily. 06/19/17   Love, Ivan Anchors, PA-C  collagenase (SANTYL) ointment Apply topically daily. 06/20/17   Love, Ivan Anchors, PA-C  famotidine (PEPCID) 20 MG tablet Take 1 tablet (20 mg total) by mouth at bedtime. 06/19/17   Love, Ivan Anchors, PA-C  hydrocerin (EUCERIN) CREA Apply 1 application topically 2 (two) times daily. To dry areas on feet. 06/19/17   Love, Ivan Anchors, PA-C  levothyroxine (SYNTHROID, LEVOTHROID) 100 MCG tablet Take 1 tablet (100 mcg total) by mouth daily before breakfast. 06/19/17   Love, Ivan Anchors, PA-C  megestrol (MEGACE) 40 MG tablet Take 1 tablet (40 mg total) by mouth 2 (two) times daily. 06/19/17   Love, Ivan Anchors, PA-C  metoprolol tartrate (LOPRESSOR) 50 MG tablet Take 1 tablet (50 mg total) by mouth 2 (two) times daily. 06/19/17   Love, Ivan Anchors, PA-C  midodrine (PROAMATINE) 10 MG tablet Take 1 tablet (10 mg total) by mouth 3 (three) times daily with meals. 06/19/17   Love, Ivan Anchors, PA-C  misoprostol (CYTOTEC) 200 MCG tablet Take 3 pills by mouth the night before biopsy. 07/04/17   Emily Filbert, MD  multivitamin (RENA-VIT) TABS tablet Take 1 tablet by mouth at bedtime. 06/19/17   Love, Ivan Anchors, PA-C  nystatin (MYCOSTATIN) 100000 UNIT/ML suspension Take 5 mLs (500,000 Units total) by mouth 4 (four) times daily. 06/19/17   Love, Ivan Anchors, PA-C  polyethylene glycol (MIRALAX / GLYCOLAX) packet Take 17 g by mouth daily as needed. For  constipation 06/19/17   Love, Ivan Anchors, PA-C  QUEtiapine (SEROQUEL) 25 MG tablet Take 1 tablet (25 mg total) by mouth at bedtime. 06/19/17   Love, Ivan Anchors, PA-C  senna-docusate (SENOKOT-S) 8.6-50 MG tablet Take 2 tablets by mouth 2 (two) times daily. 06/19/17   Love, Ivan Anchors, PA-C  sertraline (ZOLOFT) 50 MG tablet Take 50 mg by mouth daily.    [provider]  sucroferric oxyhydroxide (VELPHORO) 500 MG chewable tablet Chew 1 tablet (500 mg total) by mouth 3 (three) times daily with meals. 06/19/17   Love, Ivan Anchors, PA-C    No Known Allergies  Family History  Problem  Relation Age of Onset  . AAA (abdominal aortic aneurysm) Mother     Social History Social History  Substance Use Topics  . Smoking status: Never Smoker  . Smokeless tobacco: Never Used  . Alcohol use No    Review of Systems  Constitutional: Negative for fever. Eyes: Negative for visual changes. ENT: Negative for sore throat. Cardiovascular: Negative for chest pain. Respiratory: Negative for shortness of breath. Gastrointestinal: Negative for abdominal pain, vomiting and diarrhea. Genitourinary: Negative for dysuria. Musculoskeletal: Negative for back pain. Skin: multiple ecchymosis across her body, chest, arms, buttocks. She also has a sacral ulcer that is being treated with a wound dressing. Neurological: Negative for headache.  ____________________________________________   PHYSICAL EXAM:  VITAL SIGNS: ED Triage Vitals  Enc Vitals Group     BP 07/06/17 1253 126/64     Pulse Rate 07/06/17 1253 (!) 48     Resp --      Temp 07/06/17 1253 98.7 F (37.1 C)     Temp Source 07/06/17 1253 Oral     SpO2 07/06/17 1253 98 %     Weight --      Height --      Head Circumference --      Peak Flow --      Pain Score 07/06/17 1251 0     Pain Loc --      Pain Edu? --      Excl. in [place]? --      Constitutional: Alert and oriented. Well appearing and in no distress. HEENT   Head: Normocephalic and  atraumatic.      Eyes: Conjunctivae are normal. Pupils equal and round.       Ears:         Nose: No congestion/rhinnorhea.   Mouth/Throat: Mucous membranes are mildly dry.   Neck: No stridor. Cardiovascular/Chest: Normal rate, regular rhythm.  No murmurs, rubs, or gallops. Respiratory: Normal respiratory effort without tachypnea nor retractions. Breath sounds are clear and equal bilaterally. No wheezes/rales/rhonchi. Gastrointestinal: Soft. No distention, no guarding, no rebound. Nontender.    Genitourinary/rectal:  she does have a sacral decubitus ulcer which is covered by a dressing at this point.  stool in her underwear is black and smells of melena and is strongly heme positive Musculoskeletal: Nontender with normal range of motion in all extremities. No joint effusions.  No lower extremity tenderness.  No edema. Neurologic:  Normal speech and language. No gross or focal neurologic deficits are appreciated. Skin:  Skin is warm, dry.  multiple areas of ecchymosis both on her buttocks as well as her upper extremities and lower extremities and chest. Psychiatric: Mood and affect are normal. Speech and behavior are normal. Patient exhibits appropriate insight and judgment.   ____________________________________________  LABS (pertinent positives/negatives) I, Lisa Roca, MD the attending physician have reviewed the labs noted below.  Labs Reviewed  CBC  COMPREHENSIVE METABOLIC PANEL  PROTIME-INR  POC OCCULT BLOOD, ED  TYPE AND SCREEN    ____________________________________________    EKG I, Lisa Roca, MD, the attending physician have personally viewed and interpreted all ECGs.  50 bpm. Normal sinus rhythm. Narrow QRS. Left extremity deviation. Nonspecific T-wave ____________________________________________  RADIOLOGY All Xrays were viewed by me.  Imaging interpreted by Radiologist, and I, Lisa Roca, MD the attending physician have reviewed the radiologist  interpretation noted below.  none __________________________________________  PROCEDURES  Procedure(s) performed: None  Critical Care performed: None  ____________________________________________  No current facility-administered medications on file prior to encounter.  Current Outpatient Prescriptions on File Prior to Encounter  Medication Sig Dispense Refill  . ALPRAZolam (XANAX) 0.25 MG tablet Take 50 mg by mouth at bedtime as needed for anxiety.     Marland Kitchen amiodarone (PACERONE) 400 MG tablet Take 1 tablet (400 mg total) by mouth 2 (two) times daily. 60 tablet 0  . apixaban (ELIQUIS) 5 MG TABS tablet Take 1 tablet (5 mg total) by mouth 2 (two) times daily. 60 tablet 0  . collagenase (SANTYL) ointment Apply topically daily. 15 g 0  . famotidine (PEPCID) 20 MG tablet Take 1 tablet (20 mg total) by mouth at bedtime. 30 tablet 0  . hydrocerin (EUCERIN) CREA Apply 1 application topically 2 (two) times daily. To dry areas on feet. 454 g 0  . levothyroxine (SYNTHROID, LEVOTHROID) 100 MCG tablet Take 1 tablet (100 mcg total) by mouth daily before breakfast. 30 tablet 0  . megestrol (MEGACE) 40 MG tablet Take 1 tablet (40 mg total) by mouth 2 (two) times daily. 60 tablet 1  . metoprolol tartrate (LOPRESSOR) 50 MG tablet Take 1 tablet (50 mg total) by mouth 2 (two) times daily. 60 tablet 0  . midodrine (PROAMATINE) 10 MG tablet Take 1 tablet (10 mg total) by mouth 3 (three) times daily with meals. 90 tablet 0  . misoprostol (CYTOTEC) 200 MCG tablet Take 3 pills by mouth the night before biopsy. 3 tablet 0  . multivitamin (RENA-VIT) TABS tablet Take 1 tablet by mouth at bedtime. 30 tablet 0  . nystatin (MYCOSTATIN) 100000 UNIT/ML suspension Take 5 mLs (500,000 Units total) by mouth 4 (four) times daily. 400 mL 0  . polyethylene glycol (MIRALAX / GLYCOLAX) packet Take 17 g by mouth daily as needed. For constipation 60 each 0  . QUEtiapine (SEROQUEL) 25 MG tablet Take 1 tablet (25 mg total) by  mouth at bedtime. 30 tablet 0  . senna-docusate (SENOKOT-S) 8.6-50 MG tablet Take 2 tablets by mouth 2 (two) times daily. 120 tablet 0  . sertraline (ZOLOFT) 50 MG tablet Take 50 mg by mouth daily.    . sucroferric oxyhydroxide (VELPHORO) 500 MG chewable tablet Chew 1 tablet (500 mg total) by mouth 3 (three) times daily with meals. 90 tablet 0    ____________________________________________  ED COURSE / ASSESSMENT AND PLAN  Pertinent labs & imaging results that were available during my care of the patient were reviewed by me and considered in my medical decision making (see chart for details).    Ms. Quinteros has stable vital signs here although there was reported hypotension as well as orthostatic hypotension at home with description of black stool. She does have melena here confirmed with Hemoccult testing. Given the history of hypotension and orthostasis, patient is going to need workup for her melena. Patient is extremely difficult IV access at baseline, she is probably dehydrated now making it got much worse. No EJ available. Multiple peripheral IV attempts were made as well as ultrasound-guided IV at times. IV team was consulted to help with accessing blood and IV.  Patient care transferred to Dr. Jacqualine Code at shift change 3:45 PM. Patient will need hospital admission, pending laboratory studies and hospital admission.  DIFFERENTIAL DIAGNOSIS: including but not limited to dehydration, acute renal failure, thrombocytopenia, upper GI bleed, lower GI bleed, acute on chronic renal failure, etc.  CONSULTATIONS:   Plan for hospitalist admission, pending consult at time of transfer of care.   Patient / Family / Caregiver informed of clinical course, medical decision-making process, and agree with  plan.  ___________________________________________   FINAL CLINICAL IMPRESSION(S) / ED DIAGNOSES   Final diagnoses:  Rectal bleeding              Note: This dictation was prepared  with Dragon dictation. Any transcriptional errors that result from this process are unintentional    Lisa Roca, MD 07/06/17 1541

## 2017-07-06 NOTE — ED Notes (Signed)
Unable to get an US guided IV. Iv team in room at this time.

## 2017-07-06 NOTE — Progress Notes (Signed)
Per Dr. Verdell Carmine okay to place wound consult due to multiple wounds and skin graft.

## 2017-07-07 LAB — OCCULT BLOOD X 1 CARD TO LAB, STOOL: FECAL OCCULT BLD: POSITIVE — AB

## 2017-07-07 LAB — CBC
HCT: 33.2 % — ABNORMAL LOW (ref 35.0–47.0)
Hemoglobin: 11 g/dL — ABNORMAL LOW (ref 12.0–16.0)
MCH: 35.4 pg — ABNORMAL HIGH (ref 26.0–34.0)
MCHC: 33.1 g/dL (ref 32.0–36.0)
MCV: 106.8 fL — AB (ref 80.0–100.0)
PLATELETS: 208 10*3/uL (ref 150–440)
RBC: 3.11 MIL/uL — AB (ref 3.80–5.20)
RDW: 20 % — AB (ref 11.5–14.5)
WBC: 9.9 10*3/uL (ref 3.6–11.0)

## 2017-07-07 LAB — BASIC METABOLIC PANEL
Anion gap: 13 (ref 5–15)
BUN: 33 mg/dL — ABNORMAL HIGH (ref 6–20)
CALCIUM: 8.4 mg/dL — AB (ref 8.9–10.3)
CHLORIDE: 103 mmol/L (ref 101–111)
CO2: 26 mmol/L (ref 22–32)
CREATININE: 3.41 mg/dL — AB (ref 0.44–1.00)
GFR, EST AFRICAN AMERICAN: 15 mL/min — AB (ref >60)
GFR, EST NON AFRICAN AMERICAN: 13 mL/min — AB (ref >60)
Glucose, Bld: 85 mg/dL (ref 65–99)
Potassium: 3.5 mmol/L (ref 3.5–5.1)
SODIUM: 142 mmol/L (ref 135–145)

## 2017-07-07 LAB — HEMOGLOBIN
HEMOGLOBIN: 11 g/dL — AB (ref 12.0–16.0)
HEMOGLOBIN: 9.9 g/dL — AB (ref 12.0–16.0)

## 2017-07-07 LAB — GLUCOSE, CAPILLARY: GLUCOSE-CAPILLARY: 67 mg/dL (ref 65–99)

## 2017-07-07 LAB — VITAMIN B12: Vitamin B-12: 896 pg/mL (ref 180–914)

## 2017-07-07 MED ORDER — DEXTROSE 50 % IV SOLN
INTRAVENOUS | Status: AC
  Administered 2017-07-07: 50 mL via INTRAVENOUS
  Filled 2017-07-07: qty 50

## 2017-07-07 MED ORDER — DEXTROSE 50 % IV SOLN
1.0000 | INTRAVENOUS | Status: AC
  Administered 2017-07-07: 50 mL via INTRAVENOUS

## 2017-07-07 MED ORDER — AMIODARONE HCL 200 MG PO TABS
200.0000 mg | ORAL_TABLET | Freq: Every day | ORAL | Status: DC
  Administered 2017-07-08 – 2017-07-13 (×5): 200 mg via ORAL
  Filled 2017-07-07 (×6): qty 1

## 2017-07-07 MED ORDER — ALBUMIN HUMAN 25 % IV SOLN
25.0000 g | Freq: Once | INTRAVENOUS | Status: AC
  Administered 2017-07-07: 25 g via INTRAVENOUS
  Filled 2017-07-07 (×2): qty 100

## 2017-07-07 NOTE — Progress Notes (Signed)
HD COMPLETED  

## 2017-07-07 NOTE — Progress Notes (Signed)
PT Cancellation Note  Patient Details Name: [name] MRN: [id number] DOB: 1951/09/30   Cancelled Treatment:    Reason Eval/Treat Not Completed: Other (comment).  In HD and will try later as time and pt allow.   Ramond Dial 07/07/2017, 2:16 PM   Mee Hives, PT MS Acute Rehab Dept. Number: [place] [facility] [place]

## 2017-07-07 NOTE — Progress Notes (Signed)
Notified Dr. Earleen Newport about patient low HR, verbal order to hold morning dose of Amiodarone and MD will put in new order; Will continue to monitor.

## 2017-07-07 NOTE — Progress Notes (Signed)
PRE DIALYSIS ASSESSMENT 

## 2017-07-07 NOTE — Progress Notes (Signed)
[facility]  ROUNDING NOTE   Subjective:   [name] admitted to [facility] on 07/06/2017 for Rectal bleeding [K62.5]   Patient was discharged from [facility] rehab center on 9/11.   She is scheduled for dialysis today. RIJ permcath  Objective:  Vital signs in last 24 hours:  Temp:  [97.7 F (36.5 C)-98.7 F (37.1 C)] 97.7 F (36.5 C) (09/29 0445) Pulse Rate:  [48-93] 52 (09/29 0445) Resp:  [16-23] 16 (09/29 0445) BP: (107-129)/(48-111) 107/48 (09/29 0445) SpO2:  [98 %-100 %] 100 % (09/29 0445) Weight:  [59.4 kg (131 lb)] 59.4 kg (131 lb) (09/28 2000)  Weight change:  Filed Weights   07/06/17 2000  Weight: 59.4 kg (131 lb)    Intake/Output: No intake/output data recorded.   Intake/Output this shift:  No intake/output data recorded.  Physical Exam: General: NAD, laying in bed  Head: Normocephalic, atraumatic. Moist oral mucosal membranes  Eyes: Anicteric, PERRL  Neck: Supple, trachea midline  Lungs:  Clear to auscultation  Heart: Regular rate and rhythm  Abdomen:  Soft, nontender,   Extremities: trace peripheral edema.  Neurologic: Nonfocal, moving all four extremities  Skin: No lesions  Access: RIJ permcath    Basic Metabolic Panel:  Recent Labs Lab 07/06/17 1604 07/07/17 0333  NA 140 142  K 2.5* 3.5  CL 99* 103  CO2 27 26  GLUCOSE 86 85  BUN 27* 33*  CREATININE 3.02* 3.41*  CALCIUM 8.1* 8.4*    Liver Function Tests:  Recent Labs Lab 07/06/17 1604  AST 41  ALT 34  ALKPHOS 183*  BILITOT 1.4*  PROT 5.4*  ALBUMIN 2.5*   No results for input(s): LIPASE, AMYLASE in the last 168 hours. No results for input(s): AMMONIA in the last 168 hours.  CBC:  Recent Labs Lab 07/06/17 1604 07/06/17 2054 07/07/17 0333  WBC 10.8  --  9.9  HGB 9.9* 10.3* 11.0*  HCT 29.0*  --  33.2*  MCV 105.1*  --  106.8*  PLT 225  --  208    Cardiac Enzymes: No results for input(s): CKTOTAL, CKMB, CKMBINDEX, TROPONINI in the last 168  hours.  BNP: Invalid input(s): POCBNP  CBG: No results for input(s): GLUCAP in the last 168 hours.  Microbiology: Results for orders placed or performed during the hospital encounter of 07/06/17  MRSA PCR Screening     Status: None   Collection Time: 07/06/17  8:21 PM  Result Value Ref Range Status   MRSA by PCR NEGATIVE NEGATIVE Final    Comment:        The GeneXpert MRSA Assay (FDA approved for NASAL specimens only), is one component of a comprehensive MRSA colonization surveillance program. It is not intended to diagnose MRSA infection nor to guide or monitor treatment for MRSA infections.     Coagulation Studies:  Recent Labs  07/06/17 1604  LABPROT 27.0*  INR 2.52    Urinalysis: No results for input(s): COLORURINE, LABSPEC, PHURINE, GLUCOSEU, HGBUR, BILIRUBINUR, KETONESUR, PROTEINUR, UROBILINOGEN, NITRITE, LEUKOCYTESUR in the last 72 hours.  Invalid input(s): APPERANCEUR    Imaging: No results found.   Medications:    . [START ON 07/08/2017] amiodarone  200 mg Oral Daily  . feeding supplement (NEPRO CARB STEADY)  237 mL Oral TID BM  . levothyroxine  100 mcg Oral QAC breakfast  . megestrol  40 mg Oral BID  . midodrine  10 mg Oral TID WC  . multivitamin  1 tablet Oral QHS  . pantoprazole (PROTONIX)  IV  40 mg Intravenous Q12H  . QUEtiapine  50 mg Oral QHS  . sucroferric oxyhydroxide  500 mg Oral TID WC   acetaminophen **OR** acetaminophen, ALPRAZolam, ondansetron **OR** ondansetron (ZOFRAN) IV  Assessment/ Plan:  [name] is a 66 y.o. white female with end stage renal disease on hemodialysis, hypertension, atrial fibrillation, hypothyroidism, congestive heart failure   TTS [facility] [place]  1. End stage renal disease: with RIJ permcath. TTS schedule - hemodialysis for today. Orders prepared  2. Hypotension - midodrine with treatment  3. Anemia of chronic kidney disease: with GI bleed. Recently change to apixaban.  Status post PRBC 1  unit 9/28  4. Secondary Hyperparathyroidism:  - velphoro as outpatient.    LOS: 1 Irbin Fines 9/29/201812:48 PM

## 2017-07-07 NOTE — Progress Notes (Signed)
Patient ID: [name], female   DOB: 04-14-1951, 66 y.o.   MRN: [id number]  [facility] PROGRESS NOTE  [name] [id number]:[id number] DOB: 02/09/1951 DOA: 07/06/2017 PCP: Shannon Harrier, MD  HPI/Subjective: Patient known from prior hospitalization where she was very sick. She ended up going to rehabilitation and is now at home. Family noticed black stools and her blood pressure being low.  Patient does not offer much with regards to complaints.  Objective: Vitals:   07/07/17 1442 07/07/17 1445  BP: 99/62 137/67  Pulse: (!) 51 60  Resp: 14 16  Temp:    SpO2:      Filed Weights   07/06/17 2000 07/07/17 1325  Weight: 59.4 kg (131 lb) 59.2 kg (130 lb 8.2 oz)    ROS: Review of Systems  Constitutional: Negative for chills and fever.  Eyes: Negative for blurred vision.  Respiratory: Negative for cough and shortness of breath.   Cardiovascular: Negative for chest pain.  Gastrointestinal: Positive for melena. Negative for abdominal pain, constipation, diarrhea, nausea and vomiting.  Genitourinary: Negative for dysuria.  Musculoskeletal: Negative for joint pain.  Neurological: Negative for dizziness and headaches.   Exam: Physical Exam  Constitutional: She is oriented to person, place, and time.  HENT:  Nose: No mucosal edema.  Mouth/Throat: No oropharyngeal exudate or posterior oropharyngeal edema.  Eyes: Pupils are equal, round, and reactive to light. Conjunctivae, EOM and lids are normal.  Neck: No JVD present. Carotid bruit is not present. No edema present. No thyroid mass and no thyromegaly present.  Cardiovascular: S1 normal and S2 normal.  Exam reveals no gallop.   No murmur heard. Pulses:      Dorsalis pedis pulses are 2+ on the right side, and 2+ on the left side.  Respiratory: No respiratory distress. She has no wheezes. She has no rhonchi. She has no rales.  GI: Soft. Bowel sounds are normal. There is no tenderness.  Musculoskeletal:       Right ankle: She  exhibits swelling.       Left ankle: She exhibits swelling.  Lymphadenopathy:    She has no cervical adenopathy.  Neurological: She is alert and oriented to person, place, and time. No cranial nerve deficit.  Skin: Skin is warm. Nails show no clubbing.  Patient had skin grafts of her left lower extremity that are covered. Patient has at least a stage III decubitus on the sacrum. Large amount of bruising throughout the entire body  Psychiatric: She has a normal mood and affect.      Data Reviewed: Basic Metabolic Panel:  Recent Labs Lab 07/06/17 1604 07/07/17 0333  NA 140 142  K 2.5* 3.5  CL 99* 103  CO2 27 26  GLUCOSE 86 85  BUN 27* 33*  CREATININE 3.02* 3.41*  CALCIUM 8.1* 8.4*   Liver Function Tests:  Recent Labs Lab 07/06/17 1604  AST 41  ALT 34  ALKPHOS 183*  BILITOT 1.4*  PROT 5.4*  ALBUMIN 2.5*   CBC:  Recent Labs Lab 07/06/17 1604 07/06/17 2054 07/07/17 0333 07/07/17 1237  WBC 10.8  --  9.9  --   HGB 9.9* 10.3* 11.0* 11.0*  HCT 29.0*  --  33.2*  --   MCV 105.1*  --  106.8*  --   PLT 225  --  208  --    BNP (last 3 results)  Recent Labs  04/08/17 0139  BNP 2,012.0*     Recent Results (from the past 240 hour(s))  MRSA PCR Screening  Status: None   Collection Time: 07/06/17  8:21 PM  Result Value Ref Range Status   MRSA by PCR NEGATIVE NEGATIVE Final    Comment:        The GeneXpert MRSA Assay (FDA approved for NASAL specimens only), is one component of a comprehensive MRSA colonization surveillance program. It is not intended to diagnose MRSA infection nor to guide or monitor treatment for MRSA infections.      Scheduled Meds: . [START ON 07/08/2017] amiodarone  200 mg Oral Daily  . feeding supplement (NEPRO CARB STEADY)  237 mL Oral TID BM  . levothyroxine  100 mcg Oral QAC breakfast  . megestrol  40 mg Oral BID  . midodrine  10 mg Oral TID WC  . multivitamin  1 tablet Oral QHS  . pantoprazole (PROTONIX) IV  40 mg  Intravenous Q12H  . QUEtiapine  50 mg Oral QHS  . sucroferric oxyhydroxide  500 mg Oral TID WC   Continuous Infusions: . albumin human      Assessment/Plan:  1. Hypotension and bradycardia. Hold metoprolol. Hold amiodarone today and tomorrow started a lower dose of 200 mg daily. Patient takes midodrine at home. 2. Melena with upper GI bleed. Hold Eliquis and monitor. So far hemoglobin has actually gone up. We'll get GI consultation. On Protonix IV. 3. Atrial fibrillation with bradycardia. Hold metoprolol. Hold amiodarone today and started a lower dose of 200 mg daily tomorrow. Hold Eliquis with GI bleed. Risk of stroke higher off anticoagulation 4. Recent left upper extremity DVT. Ultrasound of the left upper extremity to see if it's still present. 5. End-stage renal disease on hemodialysis. Patient at hemodialysis currently. Patient seen earlier. 6. Hypothyroidism unspecified on Synthroid 7. Stage III decubitus ulcer present on admission. Asked nurse to put a wet to dry until wound care nurse evaluates. 8. Physical therapy evaluation  Code Status:     Code Status Orders        Start     Ordered   07/06/17 1852  Full code  Continuous     07/06/17 1851    Code Status History    Date Active Date Inactive Code Status Order ID Comments User Context   06/06/2017  6:10 PM 06/06/2017  6:10 PM Full Code [id number]  Bary Leriche, PA-C Inpatient   06/06/2017  6:10 PM 06/19/2017  6:49 PM Full Code [id number]  Bary Leriche, PA-C Inpatient   06/03/2017 12:44 AM 06/06/2017  5:51 PM Full Code [id number]  Etta Quill, DO Inpatient   05/07/2017  6:46 PM 06/03/2017 12:44 AM Full Code [id number]  Bary Leriche, PA-C Inpatient   05/07/2017  6:46 PM 05/07/2017  6:46 PM Full Code [id number]  Bary Leriche, PA-C Inpatient   05/01/2017 11:47 AM 05/07/2017  6:18 PM Full Code [id number]  Lavonia Dana, MD Inpatient   04/19/2017 10:50 AM 05/01/2017 11:47 AM DNR [id number]  Flora Lipps, MD Inpatient   04/08/2017   6:50 AM 04/19/2017 10:50 AM Full Code [id number]  Harrie Foreman, MD Inpatient     Family Communication: husband and other family member at the bedside Disposition Plan: to be determined  Consultants:  gastroenterology  Time spent: 48 minutes  Loletha Grayer  [employer]

## 2017-07-07 NOTE — Progress Notes (Signed)
This note also relates to the following rows which could not be included: Pulse Rate - Cannot attach notes to unvalidated device data Resp - Cannot attach notes to unvalidated device data BP - Cannot attach notes to unvalidated device data SpO2 - Cannot attach notes to unvalidated device data  HD STARTED  

## 2017-07-07 NOTE — Progress Notes (Signed)
POST DIALYSIS ASSESSMENT 

## 2017-07-08 ENCOUNTER — Inpatient Hospital Stay: Payer: Medicare Other

## 2017-07-08 DIAGNOSIS — K921 Melena: Secondary | ICD-10-CM

## 2017-07-08 LAB — GLUCOSE, CAPILLARY
GLUCOSE-CAPILLARY: 76 mg/dL (ref 65–99)
GLUCOSE-CAPILLARY: 93 mg/dL (ref 65–99)
Glucose-Capillary: 173 mg/dL — ABNORMAL HIGH (ref 65–99)
Glucose-Capillary: 179 mg/dL — ABNORMAL HIGH (ref 65–99)
Glucose-Capillary: 59 mg/dL — ABNORMAL LOW (ref 65–99)
Glucose-Capillary: 88 mg/dL (ref 65–99)
Glucose-Capillary: 89 mg/dL (ref 65–99)

## 2017-07-08 LAB — CBC
HEMATOCRIT: 29.4 % — AB (ref 35.0–47.0)
Hemoglobin: 9.9 g/dL — ABNORMAL LOW (ref 12.0–16.0)
MCH: 36 pg — AB (ref 26.0–34.0)
MCHC: 33.7 g/dL (ref 32.0–36.0)
MCV: 106.8 fL — AB (ref 80.0–100.0)
PLATELETS: 195 10*3/uL (ref 150–440)
RBC: 2.76 MIL/uL — ABNORMAL LOW (ref 3.80–5.20)
RDW: 19.9 % — ABNORMAL HIGH (ref 11.5–14.5)
WBC: 13.3 10*3/uL — ABNORMAL HIGH (ref 3.6–11.0)

## 2017-07-08 LAB — AMMONIA: Ammonia: 17 umol/L (ref 9–35)

## 2017-07-08 MED ORDER — DEXTROSE 50 % IV SOLN
1.0000 | INTRAVENOUS | Status: AC
  Administered 2017-07-08: 50 mL via INTRAVENOUS

## 2017-07-08 MED ORDER — DEXTROSE 50 % IV SOLN
INTRAVENOUS | Status: AC
  Administered 2017-07-08: 50 mL via INTRAVENOUS
  Filled 2017-07-08: qty 50

## 2017-07-08 MED ORDER — SERTRALINE HCL 50 MG PO TABS
25.0000 mg | ORAL_TABLET | Freq: Every day | ORAL | Status: DC
  Administered 2017-07-08 – 2017-07-13 (×6): 25 mg via ORAL
  Filled 2017-07-08 (×6): qty 1

## 2017-07-08 MED ORDER — HALOPERIDOL LACTATE 5 MG/ML IJ SOLN
1.0000 mg | Freq: Four times a day (QID) | INTRAMUSCULAR | Status: DC | PRN
  Filled 2017-07-08: qty 0.2

## 2017-07-08 NOTE — Plan of Care (Signed)
Problem: Skin Integrity: Goal: Risk for impaired skin integrity will decrease Outcome: Progressing Encouraging patient to drink her nutritional supplements to promote wound healing. Repositioning patient every two hours to alleviate pressure on vulnerable areas.   Problem: Activity: Goal: Risk for activity intolerance will decrease Outcome: Progressing Worked with PT. Performed bed exercises but refused to get up out the bed.

## 2017-07-08 NOTE — Progress Notes (Signed)
[facility]  ROUNDING NOTE   Subjective:   Husband at bedside.  Scheduled for endoscopy tomorrow.   Hemodialysis treatment yesterday. Hypotensive during treatment. Given IV albumin. Net +244mL.   Objective:  Vital signs in last 24 hours:  Temp:  [97.6 F (36.4 C)-98.2 F (36.8 C)] 98.1 F (36.7 C) (09/30 1140) Pulse Rate:  [51-67] 67 (09/30 1140) Resp:  [10-25] 17 (09/30 1140) BP: (55-145)/(43-79) 145/77 (09/30 1140) SpO2:  [99 %-100 %] 99 % (09/30 1140) Weight:  [59.2 kg (130 lb 8.2 oz)] 59.2 kg (130 lb 8.2 oz) (09/29 1325)  Weight change: -0.221 kg (-7.8 oz) Filed Weights   07/06/17 2000 07/07/17 1325  Weight: 59.4 kg (131 lb) 59.2 kg (130 lb 8.2 oz)    Intake/Output: I/O last 3 completed shifts: In: 0  Out: -200    Intake/Output this shift:  No intake/output data recorded.  Physical Exam: General: NAD, laying in bed  Head: Normocephalic, atraumatic. Moist oral mucosal membranes  Eyes: Anicteric, PERRL  Neck: Supple, trachea midline  Lungs:  Clear to auscultation  Heart: Regular rate and rhythm  Abdomen:  Soft, nontender,   Extremities: No peripheral edema.  Neurologic: Nonfocal, moving all four extremities  Skin: +sacral decub  Access: RIJ permcath    Basic Metabolic Panel:  Recent Labs Lab 07/06/17 1604 07/07/17 0333  NA 140 142  K 2.5* 3.5  CL 99* 103  CO2 27 26  GLUCOSE 86 85  BUN 27* 33*  CREATININE 3.02* 3.41*  CALCIUM 8.1* 8.4*    Liver Function Tests:  Recent Labs Lab 07/06/17 1604  AST 41  ALT 34  ALKPHOS 183*  BILITOT 1.4*  PROT 5.4*  ALBUMIN 2.5*   No results for input(s): LIPASE, AMYLASE in the last 168 hours. No results for input(s): AMMONIA in the last 168 hours.  CBC:  Recent Labs Lab 07/06/17 1604 07/06/17 2054 07/07/17 0333 07/07/17 1237 07/07/17 1922 07/08/17 0516  WBC 10.8  --  9.9  --   --  13.3*  HGB 9.9* 10.3* 11.0* 11.0* 9.9* 9.9*  HCT 29.0*  --  33.2*  --   --  29.4*  MCV 105.1*  --   106.8*  --   --  106.8*  PLT 225  --  208  --   --  195    Cardiac Enzymes: No results for input(s): CKTOTAL, CKMB, CKMBINDEX, TROPONINI in the last 168 hours.  BNP: Invalid input(s): POCBNP  CBG:  Recent Labs Lab 07/07/17 2355 07/08/17 0503 07/08/17 0548 07/08/17 0851 07/08/17 1139  GLUCAP 179* 59* 173* 89 58    Microbiology: Results for orders placed or performed during the hospital encounter of 07/06/17  MRSA PCR Screening     Status: None   Collection Time: 07/06/17  8:21 PM  Result Value Ref Range Status   MRSA by PCR NEGATIVE NEGATIVE Final    Comment:        The GeneXpert MRSA Assay (FDA approved for NASAL specimens only), is one component of a comprehensive MRSA colonization surveillance program. It is not intended to diagnose MRSA infection nor to guide or monitor treatment for MRSA infections.     Coagulation Studies:  Recent Labs  07/06/17 1604  LABPROT 27.0*  INR 2.52    Urinalysis: No results for input(s): COLORURINE, LABSPEC, PHURINE, GLUCOSEU, HGBUR, BILIRUBINUR, KETONESUR, PROTEINUR, UROBILINOGEN, NITRITE, LEUKOCYTESUR in the last 72 hours.  Invalid input(s): APPERANCEUR    Imaging: US Venous Img Upper Uni Left  Result Date: 07/08/2017  CLINICAL DATA:  History DVT involving the cephalic vein, now with left upper extremity pain and edema. History of type 2 diabetes. Patient is on dialysis with right jugular approach dialysis catheter. Evaluate for acute or chronic DVT. EXAM: LEFT UPPER EXTREMITY VENOUS DOPPLER ULTRASOUND TECHNIQUE: Gray-scale sonography with graded compression, as well as color Doppler and duplex ultrasound were performed to evaluate the upper extremity deep venous system from the level of the subclavian vein and including the jugular, axillary, basilic, radial, ulnar and upper cephalic vein. Spectral Doppler was utilized to evaluate flow at rest and with distal augmentation maneuvers. COMPARISON:  Chest radiograph - 06/19/2017  FINDINGS: Contralateral Subclavian Vein: Not visualized secondary to presence of right jugular approach dialysis catheter and associated bandage. Internal Jugular Vein: No evidence of thrombus. Normal compressibility, respiratory phasicity and response to augmentation. Subclavian Vein: No evidence of thrombus. Normal compressibility, respiratory phasicity and response to augmentation. Axillary Vein: No evidence of thrombus. Normal compressibility, respiratory phasicity and response to augmentation. Cephalic Vein: No evidence of thrombus. Normal compressibility, respiratory phasicity and response to augmentation. Basilic Vein: No evidence of thrombus. Normal compressibility, respiratory phasicity and response to augmentation. Brachial Veins: No evidence of thrombus. Normal compressibility, respiratory phasicity and response to augmentation. Radial Veins: No evidence of thrombus. Normal compressibility, respiratory phasicity and response to augmentation. Ulnar Veins: No evidence of thrombus. Normal compressibility, respiratory phasicity and response to augmentation. Venous Reflux:  None visualized. Other Findings:  None visualized. IMPRESSION: No evidence of acute or chronic DVT within the left upper extremity. Electronically Signed   By: Sandi Mariscal M.D.   On: 07/08/2017 08:42     Medications:    . amiodarone  200 mg Oral Daily  . feeding supplement (NEPRO CARB STEADY)  237 mL Oral TID BM  . levothyroxine  100 mcg Oral QAC breakfast  . megestrol  40 mg Oral BID  . midodrine  10 mg Oral TID WC  . multivitamin  1 tablet Oral QHS  . pantoprazole (PROTONIX) IV  40 mg Intravenous Q12H  . QUEtiapine  50 mg Oral QHS  . sertraline  25 mg Oral Daily  . sucroferric oxyhydroxide  500 mg Oral TID WC   acetaminophen **OR** acetaminophen, ALPRAZolam, ondansetron **OR** ondansetron (ZOFRAN) IV  Assessment/ Plan:  [name] is a 66 y.o. white female with end stage renal disease on hemodialysis,  hypertension, atrial fibrillation, hypothyroidism, congestive heart failure   TTS [facility]  1. End stage renal disease: with RIJ permcath. TTS schedule  2. Hypotension:  - midodrine with treatment  3. Anemia of chronic kidney disease: with GI bleed. Recently change to apixaban.  Status post PRBC 1 unit 9/28 - Appreciate GI input.   4. Secondary Hyperparathyroidism:  - velphoro as outpatient.    LOS: [place], [place] 9/30/201812:29 PM

## 2017-07-08 NOTE — Clinical Social Work Note (Signed)
Clinical Social Work Assessment  Patient Details  Name: [name] MRN: [id number] Date of Birth: 1951-09-02  Date of referral:  07/08/17               Reason for consult:  Facility Placement                Permission sought to share information with:  Chartered certified accountant granted to share information::  Yes, Verbal Permission Granted  Name::        Agency::     Relationship::     Contact Information:     Housing/Transportation Living arrangements for the past 2 months:  Single Family Home Source of Information:  Medical Team, Spouse Patient Interpreter Needed:  None Criminal Activity/Legal Involvement Pertinent to Current Situation/Hospitalization:  No - Comment as needed Significant Relationships:  Spouse Lives with:  Spouse Do you feel safe going back to the place where you live?  Yes Need for family participation in patient care:  Yes (Comment) (The patient is currently AMS)  Care giving concerns:  PT recommendation for STR   Social Worker assessment / plan:  The CSW spoke with the patient's spouse as the patient is currently lethargic and AMS. Richardson Landry gave verbal permission to begin the STR referral process. At this time, he reports that he is unsure if STR is the direction they want to pursue; however, he wants to keep the options open and ready should they make that decision.  Currently, Richardson Landry has been providing all care for his wife who has been bed/wheelchair bound for the past month. The patient has been incontinent of both bladder and bowel due to the difficulty of using a slide board with a bedside commode as the patient has a pressure wound on her buttocks. The patient's husband seems to be in the beginning stage of caregiver burnout as he is reporting that he feels guilty and helpless with regards to some aspects of his wife's care. The CSW provided emotional support and validated his feelings as well as offered thanks for his care.   Discharge is  unknown at this time. The CSW has begun the referral process and will follow up with bed offers as they become available.    Employment status:  Retired Nurse, adult PT Recommendations:  [place] / Referral to community resources:  [place]  Patient/Family's Response to care:  The patient was non-verbal. The patient's husband thanked the CSW.  Patient/Family's Understanding of and Emotional Response to Diagnosis, Current Treatment, and Prognosis:  The patient's husband seems to be feeling the burden of caregiving which is appropriate given the level of care his wife needs. He is undecided at this time about STR vs. Home health; however, he has given permission to begin the process.  Emotional Assessment Appearance:  Appears stated age Attitude/Demeanor/Rapport:  Lethargic, Irrational Affect (typically observed):  Quiet Orientation:  Oriented to Self Alcohol / Substance use:  Never Used Psych involvement (Current and /or in the community):  No (Comment)  Discharge Needs  Concerns to be addressed:  Care Coordination, Discharge Planning Concerns Readmission within the last 30 days:  Yes Current discharge risk:  Chronically ill Barriers to Discharge:  Continued Medical Work up   [employer], LCSW 07/08/2017, 4:17 PM

## 2017-07-08 NOTE — Progress Notes (Signed)
Patient ID: [name], female   DOB: July 10, 1951, 66 y.o.   MRN: [id number]  [facility] PROGRESS NOTE  [name] WFU:[id number] DOB: 01/16/1951 DOA: 07/06/2017 PCP: Tracie Harrier, MD  HPI/Subjective: As per husband, patient having hallucinations seeing things outside and on the walls. As per the husband, patient seems a little bit better today. Had another black stool this morning. No nausea or vomiting or abdominal pain.  Objective: Vitals:   07/08/17 0429 07/08/17 1140  BP: (!) 142/79 (!) 145/77  Pulse: 64 67  Resp: 16 17  Temp: 98.1 F (36.7 C) 98.1 F (36.7 C)  SpO2: 100% 99%    Filed Weights   07/06/17 2000 07/07/17 1325  Weight: 59.4 kg (131 lb) 59.2 kg (130 lb 8.2 oz)    ROS: Review of Systems  Constitutional: Negative for chills and fever.  Eyes: Negative for blurred vision.  Respiratory: Negative for cough and shortness of breath.   Cardiovascular: Negative for chest pain.  Gastrointestinal: Positive for diarrhea and melena. Negative for abdominal pain, constipation, nausea and vomiting.  Genitourinary: Negative for dysuria.  Musculoskeletal: Negative for joint pain.  Neurological: Negative for dizziness and headaches.   Exam: Physical Exam  Constitutional: She is oriented to person, place, and time.  HENT:  Nose: No mucosal edema.  Mouth/Throat: No oropharyngeal exudate or posterior oropharyngeal edema.  Eyes: Pupils are equal, round, and reactive to light. Conjunctivae, EOM and lids are normal.  Neck: No JVD present. Carotid bruit is not present. No edema present. No thyroid mass and no thyromegaly present.  Cardiovascular: S1 normal and S2 normal.  Exam reveals no gallop.   No murmur heard. Pulses:      Dorsalis pedis pulses are 2+ on the right side, and 2+ on the left side.  Respiratory: No respiratory distress. She has no wheezes. She has no rhonchi. She has no rales.  GI: Soft. Bowel sounds are normal. There is no tenderness.   Musculoskeletal:       Right ankle: She exhibits swelling.       Left ankle: She exhibits swelling.  Lymphadenopathy:    She has no cervical adenopathy.  Neurological: She is alert and oriented to person, place, and time. No cranial nerve deficit.  Skin: Skin is warm. Nails show no clubbing.  Patient had skin grafts of her left lower extremity that are covered. Patient has at least a stage III decubitus on the sacrum. Large amount of bruising throughout the entire body  Psychiatric: She has a normal mood and affect.      Data Reviewed: Basic Metabolic Panel:  Recent Labs Lab 07/06/17 1604 07/07/17 0333  NA 140 142  K 2.5* 3.5  CL 99* 103  CO2 27 26  GLUCOSE 86 85  BUN 27* 33*  CREATININE 3.02* 3.41*  CALCIUM 8.1* 8.4*   Liver Function Tests:  Recent Labs Lab 07/06/17 1604  AST 41  ALT 34  ALKPHOS 183*  BILITOT 1.4*  PROT 5.4*  ALBUMIN 2.5*   CBC:  Recent Labs Lab 07/06/17 1604 07/06/17 2054 07/07/17 0333 07/07/17 1237 07/07/17 1922 07/08/17 0516  WBC 10.8  --  9.9  --   --  13.3*  HGB 9.9* 10.3* 11.0* 11.0* 9.9* 9.9*  HCT 29.0*  --  33.2*  --   --  29.4*  MCV 105.1*  --  106.8*  --   --  106.8*  PLT 225  --  208  --   --  195   BNP (last  3 results)  Recent Labs  04/08/17 0139  BNP 2,012.0*     Recent Results (from the past 240 hour(s))  MRSA PCR Screening     Status: None   Collection Time: 07/06/17  8:21 PM  Result Value Ref Range Status   MRSA by PCR NEGATIVE NEGATIVE Final    Comment:        The GeneXpert MRSA Assay (FDA approved for NASAL specimens only), is one component of a comprehensive MRSA colonization surveillance program. It is not intended to diagnose MRSA infection nor to guide or monitor treatment for MRSA infections.      Scheduled Meds: . amiodarone  200 mg Oral Daily  . feeding supplement (NEPRO CARB STEADY)  237 mL Oral TID BM  . levothyroxine  100 mcg Oral QAC breakfast  . megestrol  40 mg Oral BID  .  midodrine  10 mg Oral TID WC  . multivitamin  1 tablet Oral QHS  . pantoprazole (PROTONIX) IV  40 mg Intravenous Q12H  . QUEtiapine  50 mg Oral QHS  . sertraline  25 mg Oral Daily  . sucroferric oxyhydroxide  500 mg Oral TID WC    Assessment/Plan:  1. Hypotension and bradycardia. Hold metoprolol. Restart lower dose amiodarone. Hypotension has resolved on midodrine. Bradycardia has improved and heart rate is 67. 2. Melena with upper GI bleed. Hold Eliquis and monitor. Watch hemoglobin closely. Last hemoglobin 9.9. On Protonix IV.  Appreciate GI consultation for endoscopy tomorrow 3. Atrial fibrillation with bradycardia. Hold metoprolol. Restart amiodarone today and started a lower dose of 200 mg daily. Hold Eliquis with GI bleed. Risk of stroke higher off anticoagulation 4. Recent left upper extremity DVT. Ultrasound of the left upper extremity negative for DVT. 5. End-stage renal disease on hemodialysis. Patient had hemodialysis yesterday 6. Hypothyroidism unspecified on Synthroid 7. Stage III decubitus ulcer present on admission. Asked nurse to put a wet to dry until wound care nurse evaluates. 8. Physical therapy evaluation. 9. Hallucinations. On Seroquel at night. Add IV Haldol when necessary during the day. Husband thinks it secondary to the increasing the Zoloft dose. This was decreased down to 25 mg daily  Code Status:     Code Status Orders        Start     Ordered   07/06/17 1852  Full code  Continuous     07/06/17 1851    Code Status History    Date Active Date Inactive Code Status Order ID Comments User Context   06/06/2017  6:10 PM 06/06/2017  6:10 PM Full Code [id number]  Bary Leriche, PA-C Inpatient   06/06/2017  6:10 PM 06/19/2017  6:49 PM Full Code [id number]  Bary Leriche, PA-C Inpatient   06/03/2017 12:44 AM 06/06/2017  5:51 PM Full Code [id number]  Etta Quill, DO Inpatient   05/07/2017  6:46 PM 06/03/2017 12:44 AM Full Code [id number]  Bary Leriche, PA-C  Inpatient   05/07/2017  6:46 PM 05/07/2017  6:46 PM Full Code [id number]  Bary Leriche, PA-C Inpatient   05/01/2017 11:47 AM 05/07/2017  6:18 PM Full Code [id number]  Lavonia Dana, MD Inpatient   04/19/2017 10:50 AM 05/01/2017 11:47 AM DNR [id number]  Flora Lipps, MD Inpatient   04/08/2017  6:50 AM 04/19/2017 10:50 AM Full Code [id number]  Harrie Foreman, MD Inpatient     Family Communication: husband and other family member at the bedside Disposition Plan: to be determined  Consultants:  gastroenterology  Time spent: 46  minutes  Loletha Grayer  [employer]

## 2017-07-08 NOTE — Clinical Social Work Note (Signed)
CSW is aware through chart review that PT has recommended STR. CSW will assess when able.  Santiago Bumpers, MSW, Latanya Presser [phone]

## 2017-07-08 NOTE — Progress Notes (Signed)
Physical Therapy Evaluation Patient Details Name: [name] MRN: [id number] DOB: 08-Apr-1951 Today's Date: 07/08/2017   History of Present Illness  Patient is a 66 y.o. female admitted on 79 SEP with anemia and rectal bleeding. PMH includes atrial fibrillation, ESRD with hemodialysis Tuesday, Thursday, and Saturday, and hypertension.  More recently, patient has been at home with husband in w/c, receiving HHPT.  Clinical Impression  Patient oriented x2 at time of evaluation. Patient was cooperative at initiation of session with decreased short term memory recall. Demonstrated 3-/5 to 3/5 strength in both UE/LEs globally, partially due to decreased ability to follow commands, but able to be moved without pain passively. Rolled to L with moderate assistance and cues. Husband arrived during assessment, and patient became tearful and less cooperative with PT. Patient was transferred to sitting with moderate-maximal assistance, at times for static seated balance, leaning and requesting to return to supine. Patient refused sit to stand transfer attempt. Upon return to supine, PT assisted RN with changing soiled undergarments/bedding where patient put forth even less effort to roll in bed. Patient is deemed to not be at baseline level of function and will continue to benefit from progressive PT to restore overall strength and functional mobility. Patient will require f/u at SNF upon d/c.    Follow Up Recommendations SNF    Equipment Recommendations  Rolling walker with 5" wheels    Recommendations for Other Services       Precautions / Restrictions Precautions Precautions: Fall Restrictions Weight Bearing Restrictions: No      Mobility  Bed Mobility Overal bed mobility: Needs Assistance Bed Mobility: Rolling;Supine to Sit Rolling: Mod assist   Supine to sit: Max assist;HOB elevated     General bed mobility comments: Patient required moderate assistance to roll during evaluation and  maximal assistance during functional activity of changing soiled undergaments. Required maximal assistance to sitting, pushing back towards [facility], refusing to stay seated/perform sit to stand transfer.  Transfers                 General transfer comment: Patient refused  Ambulation/Gait                Stairs            Wheelchair Mobility    Modified Rankin (Stroke Patients Only)       Balance Overall balance assessment: Needs assistance Sitting-balance support: Bilateral upper extremity supported Sitting balance-Leahy Scale: Poor Sitting balance - Comments: Patient leaning  Postural control: Left lateral lean                                   Pertinent Vitals/Pain Pain Assessment: Faces Faces Pain Scale: Hurts little more Pain Location: Buttock Pain Descriptors / Indicators: Aching Pain Intervention(s): Limited activity within patient's tolerance;Monitored during session;Repositioned;Utilized relaxation techniques    Home Living Family/patient expects to be discharged to:: Private residence Living Arrangements: Spouse/significant other Available Help at Discharge: Family;Available 24 hours/day Type of Home: House Home Access: Ramped entrance;Stairs to enter Entrance Stairs-Rails: Right;Left;Can reach both Entrance Stairs-Number of Steps: 4 Home Layout: One level Home Equipment: Walker - 4 wheels;Bedside commode;Hand held shower head;Hospital bed;Wheelchair - Education administrator (comment) (Slide board)      Prior Function Level of Independence: Needs assistance   Gait / Transfers Assistance Needed: Slide board transfers with +2 to w/c  ADL's / Homemaking Assistance Needed: Performed by husband  Hand Dominance        Extremity/Trunk Assessment   Upper Extremity Assessment Upper Extremity Assessment: Generalized weakness;Difficult to assess due to impaired cognition    Lower Extremity Assessment Lower Extremity Assessment:  Generalized weakness;Difficult to assess due to impaired cognition       Communication   Communication: No difficulties  Cognition Arousal/Alertness: Awake/alert Behavior During Therapy: Flat affect Overall Cognitive Status: Impaired/Different from baseline Area of Impairment: Memory                     Memory: Decreased short-term memory                General Comments      Exercises     Assessment/Plan    PT Assessment Patient needs continued PT services  PT Problem List Decreased strength;Decreased range of motion;Decreased activity tolerance;Decreased balance;Decreased mobility;Decreased cognition;Decreased safety awareness;Pain;Decreased skin integrity       PT Treatment Interventions DME instruction;Gait training;Stair training;Functional mobility training;Therapeutic activities;Therapeutic exercise;Balance training;Patient/family education    PT Goals (Current goals can be found in the Care Plan section)  Acute Rehab PT Goals Patient Stated Goal: Unstated PT Goal Formulation: With patient/family Time For Goal Achievement: 07/22/17 Potential to Achieve Goals: Fair    Frequency Min 2X/week   Barriers to discharge Inaccessible home environment;Decreased caregiver support      Co-evaluation               AM-PAC PT "6 Clicks" Daily Activity  Outcome Measure Difficulty turning over in bed (including adjusting bedclothes, sheets and blankets)?: Unable Difficulty moving from lying on back to sitting on the side of the bed? : Unable Difficulty sitting down on and standing up from a chair with arms (e.g., wheelchair, bedside commode, etc,.)?: Unable Help needed moving to and from a bed to chair (including a wheelchair)?: Total Help needed walking in hospital room?: Total Help needed climbing 3-5 steps with a railing? : Total 6 Click Score: 6    End of Session   Activity Tolerance: Treatment limited secondary to agitation Patient left: in  bed;with call bell/phone within reach;with bed alarm set;with family/visitor present;with nursing/sitter in room   PT Visit Diagnosis: Muscle weakness (generalized) (M62.81);Difficulty in walking, not elsewhere classified (R26.2);Pain;Adult, failure to thrive (R62.7)    Time: 3267-1245 PT Time Calculation (min) (ACUTE ONLY): 42 min   Charges:   PT Evaluation $PT Eval Low Complexity: 1 Low PT Treatments $Therapeutic Activity: 8-22 mins   PT G Codes:   PT G-Codes **NOT FOR INPATIENT CLASS** Functional Assessment Tool Used: AM-PAC 6 Clicks Basic Mobility;Clinical judgement Functional Limitation: Mobility: Walking and moving around Mobility: Walking and Moving Around Current Status (Y0998): At least 80 percent but less than 100 percent impaired, limited or restricted Mobility: Walking and Moving Around Goal Status [phone]): At least 80 percent but less than 100 percent impaired, limited or restricted      Dorice Lamas, PT, DPT 07/08/2017, 9:30 AM

## 2017-07-08 NOTE — Progress Notes (Signed)
Pt noted to be irritable and uncooperative at beginning of shift. Yelling out and confused. Baseline Alert and Oriented x 4. BG checked - 67. VSS. MD notified. Orders for 1 amp d50 ordered and given. Repeat cbg - 179. Pt is more cooperative and pleasant. Currently sleeping in bed. Will continue to monitor and re-assess mentation when she wakes up.

## 2017-07-08 NOTE — NC FL2 (Signed)
[place] LEVEL OF CARE SCREENING TOOL     IDENTIFICATION  Patient Name: [name] Birthdate: 05/13/1951 Sex: female Admission Date (Current Location): 07/06/2017  [place] and [place] Number:  Engineering geologist and Address:  [facility] [address], [place], [place] [address]      Provider Number: [id number]  Attending Physician Name and Address:  Loletha Grayer, MD  Relative Name and Phone Number:  [name] ([phone])    Current Level of Care: Hospital Recommended Level of Care: [place] Prior Approval Number:    Date Approved/Denied:   PASRR Number: [id number]  Discharge Plan: SNF    Current Diagnoses: Patient Active Problem List   Diagnosis Date Noted  . Melena   . Anemia 07/06/2017  . Skin ulcer of sacrum ([place])   . Leukopenia   . Slow transit constipation   . Acute blood loss anemia   . Splinter of foot without major open wound or infection   . Dependence on renal dialysis ([place])   . Chronic kidney disease (CKD), stage IV (severe) ([place])   . Atrial fibrillation with rapid ventricular response ([place])   . Chronic anticoagulation   . Pressure injury of skin 06/04/2017  . Chronic systolic CHF (congestive heart failure) ([place]) 06/03/2017  . Atrial fibrillation with RVR ([place]) 06/02/2017  . ESRD (end stage renal disease) ([place])   . Vaginal discharge   . AKI (acute kidney injury) ([place])   . Bleeding   . A-fib ([place])   . Abnormal urinalysis   . Dependence on hemodialysis ([place])   . Ulcer of left lower extremity with fat layer exposed ([place])   . Skin ulcer of left foot with fat layer exposed ([place])   . Vaginal bleeding   . Hypoglycemia associated with diabetes ([place])   . Skin ulcer of right heel ([place])   . PAF (paroxysmal atrial fibrillation) ([place])   . Hypotension   . Leukocytosis   . Anemia of chronic disease   . Anxiety state   . Depression   . Prediabetes   . Stage 5 chronic kidney disease  not on chronic dialysis ([place])   . 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 Pressure sore on buttocks 04/19/2017  . Acute renal failure (ARF) ([place])   . Cellulitis of lower extremity   . Edema extremities   . Acute respiratory failure ([place])   . Acute renal failure ([place])   . Multi-organ failure with heart failure ([place])   . Palliative care by specialist   . Goals of care, counseling/discussion   . Septic shock ([place]) 04/08/2017    Orientation RESPIRATION BLADDER Height & Weight     Self, Time, Situation, Place  Normal Incontinent Weight: 130 lb 8.2 oz (59.2 kg) Height:  5\' 5"  (165.1 cm)  BEHAVIORAL SYMPTOMS/MOOD NEUROLOGICAL BOWEL NUTRITION STATUS      Incontinent Diet (Clear liquid)  AMBULATORY STATUS COMMUNICATION OF NEEDS Skin   Extensive Assist Verbally PU Stage and Appropriate Care, Skin abrasions, Bruising (Stage III decubitus; saccral. Skin grafting on lower back area. Dressing daily; bruising on multiple areas over the body.) PU Stage 1 Dressing: Daily                     Personal Care Assistance Level of Assistance  Bathing, Feeding, Dressing Bathing Assistance: Maximum assistance Feeding assistance: Limited assistance Dressing Assistance: Maximum assistance     Functional Limitations Info  SPECIAL CARE FACTORS FREQUENCY  PT (By licensed PT)     PT Frequency: Up to 5X per day, 5 days per week              Contractures Contractures Info: Not present    Additional Factors Info  Code Status, Allergies, Psychotropic Code Status Info: Full Allergies Info: No Known Allergies Psychotropic Info: Xanax, Seroquel, Zoloft         Current Medications (07/08/2017):  This is the current hospital active medication list Current Facility-Administered Medications  Medication Dose Route Frequency Provider Last Rate Last Dose  . acetaminophen (TYLENOL) tablet 650 mg  650 mg Oral Q6H PRN Gladstone Lighter, MD       Or  . acetaminophen (TYLENOL)  suppository 650 mg  650 mg Rectal Q6H PRN Gladstone Lighter, MD      . ALPRAZolam Duanne Moron) tablet 0.25 mg  0.25 mg Oral QHS PRN Gladstone Lighter, MD      . amiodarone (PACERONE) tablet 200 mg  200 mg Oral Daily Loletha Grayer, MD   200 mg at 07/08/17 1346  . feeding supplement (NEPRO CARB STEADY) liquid 237 mL  237 mL Oral TID BM Gladstone Lighter, MD   237 mL at 07/08/17 1340  . haloperidol lactate (HALDOL) injection 1 mg  1 mg Intravenous Q6H PRN Wieting, Richard, MD      . levothyroxine (SYNTHROID, LEVOTHROID) tablet 100 mcg  100 mcg Oral QAC breakfast Gladstone Lighter, MD   100 mcg at 07/08/17 5625  . megestrol (MEGACE) tablet 40 mg  40 mg Oral BID Gladstone Lighter, MD   40 mg at 07/08/17 0923  . midodrine (PROAMATINE) tablet 10 mg  10 mg Oral TID WC Gladstone Lighter, MD   10 mg at 07/08/17 0919  . multivitamin (RENA-VIT) tablet 1 tablet  1 tablet Oral QHS Gladstone Lighter, MD   1 tablet at 07/07/17 2109  . ondansetron (ZOFRAN) tablet 4 mg  4 mg Oral Q6H PRN Gladstone Lighter, MD       Or  . ondansetron (ZOFRAN) injection 4 mg  4 mg Intravenous Q6H PRN Gladstone Lighter, MD      . pantoprazole (PROTONIX) injection 40 mg  40 mg Intravenous Q12H Gladstone Lighter, MD   40 mg at 07/08/17 0923  . QUEtiapine (SEROQUEL) tablet 50 mg  50 mg Oral QHS Gladstone Lighter, MD   50 mg at 07/07/17 2108  . sertraline (ZOLOFT) tablet 25 mg  25 mg Oral Daily Kolluru, Sarath, MD   25 mg at 07/08/17 1340  . sucroferric oxyhydroxide (VELPHORO) chewable tablet 500 mg  500 mg Oral TID WC Gladstone Lighter, MD   500 mg at 07/08/17 6389     Discharge Medications: Please see discharge summary for a list of discharge medications.  Relevant Imaging Results:  Relevant Lab Results:   Additional Information SS# [id number]  Zettie Pho, LCSW

## 2017-07-08 NOTE — Consult Note (Signed)
Shannon Lame, MD [facility]  [address] [place], [place] [address] Phone: [phone] Fax : [phone]  Consultation  Referring Provider:     Dr. Leslye Peer Primary Care Physician:  Tracie Harrier, MD Primary Gastroenterologist:  Althia Forts         Reason for Consultation:     Melena  Date of Admission:  07/06/2017 Date of Consultation:  07/08/2017         HPI:   [name] is a 66 y.o. female Who has a history of end-stage renal disease who also was in the hospital recently and was critically ill. The patient was in the hospital for sepsis from cellulitis with a PA arrest and was found to have a left upper extremity DVT. The patient also suffers from atrial fibrillation.  The patient had been having dark stools on and off for the last 2 weeks.  In the last couple of days the stools have gotten darker and reported as tarry black.  The patient's hemoglobin on admission was 9.9 with a repeat hemoglobin yesterday of 11 which came back down to 9.9 today.  The patient denies any abdominal pain nausea vomiting fevers or chills.  The patient has been having decreased by mouth intake and does not really have an appetite.  Past Medical History:  Diagnosis Date  . A-fib ([place])    on eliquis  . Dysrhythmia   . ESRD (end stage renal disease) ([place])    on Tue-Thur-Sat dialysis  . Hypertension   . Hypothyroidism   . Thyroid disease     Past Surgical History:  Procedure Laterality Date  . APPLICATION OF A-CELL OF EXTREMITY Left 05/28/2017   Procedure: APPLICATION OF A-CELL OF LEFT LOWER EXTREMITY;  Surgeon: Wallace Going, DO;  Location: [place] [facility];  Service: Plastics;  Laterality: Left;  . APPLICATION OF WOUND VAC Left 05/28/2017   Procedure: APPLICATION OF WOUND VAC;  Surgeon: Wallace Going, DO;  Location: [place];  Service: Plastics;  Laterality: Left;  . DIALYSIS/PERMA CATHETER INSERTION N/A 04/26/2017   Procedure: Dialysis/Perma Catheter Insertion;  Surgeon: Algernon Huxley, MD;   Location: [facility];  Service: Cardiovascular;  Laterality: N/A;  . INCISION AND DRAINAGE OF WOUND Left 05/28/2017   Procedure: IRRIGATION AND DEBRIDEMENT LEFT LOWER LEG WOUND;  Surgeon: Wallace Going, DO;  Location: [facility];  Service: Plastics;  Laterality: Left;  . PR DEBRIDEMENT, SKIN, SUB-Q TISSUE,=<20 SQ CM  05/21/2017      . PR DEBRIDEMENT, SKIN, SUB-Q TISSUE,EACH ADD 20 SQ CM  05/21/2017      . WRIST ARTHROSCOPY      Prior to Admission medications   Medication Sig Start Date End Date Taking? Authorizing Provider  amiodarone (PACERONE) 400 MG tablet Take 1 tablet (400 mg total) by mouth 2 (two) times daily. 06/19/17  Yes Love, Ivan Anchors, PA-C  apixaban (ELIQUIS) 5 MG TABS tablet Take 1 tablet (5 mg total) by mouth 2 (two) times daily. 06/19/17  Yes Love, Ivan Anchors, PA-C  collagenase (SANTYL) ointment Apply topically daily. 06/20/17  Yes Love, Ivan Anchors, PA-C  famotidine (PEPCID) 20 MG tablet Take 1 tablet (20 mg total) by mouth at bedtime. 06/19/17  Yes Love, Ivan Anchors, PA-C  hydrocerin (EUCERIN) CREA Apply 1 application topically 2 (two) times daily. To dry areas on feet. 06/19/17  Yes Love, Ivan Anchors, PA-C  levothyroxine (SYNTHROID, LEVOTHROID) 100 MCG tablet Take 1 tablet (100 mcg total) by mouth daily before breakfast. 06/19/17  Yes  Love, Pamela S, PA-C  megestrol (MEGACE) 40 MG tablet Take 1 tablet (40 mg total) by mouth 2 (two) times daily. 06/19/17  Yes Love, Ivan Anchors, PA-C  metoprolol tartrate (LOPRESSOR) 50 MG tablet Take 1 tablet (50 mg total) by mouth 2 (two) times daily. 06/19/17  Yes Love, Ivan Anchors, PA-C  midodrine (PROAMATINE) 10 MG tablet Take 1 tablet (10 mg total) by mouth 3 (three) times daily with meals. 06/19/17  Yes Love, Ivan Anchors, PA-C  multivitamin (RENA-VIT) TABS tablet Take 1 tablet by mouth at bedtime. 06/19/17  Yes Love, Ivan Anchors, PA-C  QUEtiapine (SEROQUEL) 25 MG tablet Take 1 tablet (25 mg total) by mouth at bedtime. 06/19/17  Yes Love, Ivan Anchors, PA-C    senna-docusate (SENOKOT-S) 8.6-50 MG tablet Take 2 tablets by mouth 2 (two) times daily. 06/19/17  Yes Love, Ivan Anchors, PA-C  sertraline (ZOLOFT) 50 MG tablet Take 50 mg by mouth daily.   Yes [provider]  sucroferric oxyhydroxide (VELPHORO) 500 MG chewable tablet Chew 1 tablet (500 mg total) by mouth 3 (three) times daily with meals. 06/19/17  Yes Love, Ivan Anchors, PA-C  ALPRAZolam (XANAX) 0.25 MG tablet Take 50 mg by mouth at bedtime as needed for anxiety.     [provider]  misoprostol (CYTOTEC) 200 MCG tablet Take 3 pills by mouth the night before biopsy. Patient not taking: Reported on 07/06/2017 07/04/17   Shannon Filbert, MD  nystatin (MYCOSTATIN) 100000 UNIT/ML suspension Take 5 mLs (500,000 Units total) by mouth 4 (four) times daily. Patient not taking: Reported on 07/06/2017 06/19/17   Love, Ivan Anchors, PA-C  polyethylene glycol [facility] / GLYCOLAX) packet Take 17 g by mouth daily as needed. For constipation 06/19/17   Love, Ivan Anchors, PA-C    Family History  Problem Relation Age of Onset  . AAA (abdominal aortic aneurysm) Mother      Social History  Substance Use Topics  . Smoking status: Never Smoker  . Smokeless tobacco: Never Used  . Alcohol use No    Allergies as of 07/06/2017  . (No Known Allergies)    Review of Systems:    All systems reviewed and negative except where noted in HPI.   Physical Exam:  Vital signs in last 24 hours: Temp:  [97.6 F (36.4 C)-98.2 F (36.8 C)] 98.1 F (36.7 C) (09/30 1140) Pulse Rate:  [51-67] 67 (09/30 1140) Resp:  [10-25] 17 (09/30 1140) BP: (73-145)/(45-79) 145/77 (09/30 1140) SpO2:  [99 %-100 %] 99 % (09/30 1140) Last BM Date: 07/08/17 General:   Pleasant, cooperative in NAD Head:  Normocephalic and atraumatic. Eyes:   No icterus.   Conjunctiva pink. PERRLA. Ears:  Normal auditory acuity. Neck:  Supple; no masses or thyroidomegaly Lungs: Respirations even and unlabored. Lungs clear to auscultation bilaterally.    No wheezes, crackles, or rhonchi.  Heart:  Regular rate and rhythm;  Without murmur, clicks, rubs or gallops Abdomen:  Soft, nondistended, nontender. Normal bowel sounds. No appreciable masses or hepatomegaly.  No rebound or guarding.  Rectal:  Not performed. Msk:  Symmetrical without gross deformities.    Extremities:  Without edema, cyanosis or clubbing. Neurologic:  Alert But confused;  grossly normal neurologically. Skin:  Intact without significant lesions or rashes. Cervical Nodes:  No significant cervical adenopathy. Psych:  Alert and cooperative. Normal affect.  LAB RESULTS:  Recent Labs  07/06/17 1604  07/07/17 0333 07/07/17 1237 07/07/17 1922 07/08/17 0516  WBC 10.8  --  9.9  --   --  13.3*  HGB 9.9*  < > 11.0* 11.0* 9.9* 9.9*  HCT 29.0*  --  33.2*  --   --  29.4*  PLT 225  --  208  --   --  195  < > = values in this interval not displayed. BMET  Recent Labs  07/06/17 1604 07/07/17 0333  NA 140 142  K 2.5* 3.5  CL 99* 103  CO2 27 26  GLUCOSE 86 85  BUN 27* 33*  CREATININE 3.02* 3.41*  CALCIUM 8.1* 8.4*   LFT  Recent Labs  07/06/17 1604  PROT 5.4*  ALBUMIN 2.5*  AST 41  ALT 34  ALKPHOS 183*  BILITOT 1.4*   PT/INR  Recent Labs  07/06/17 1604  LABPROT 27.0*  INR 2.52    STUDIES: US Venous Img Upper Uni Left  Result Date: 07/08/2017 CLINICAL DATA:  History DVT involving the cephalic vein, now with left upper extremity pain and edema. History of type 2 diabetes. Patient is on dialysis with right jugular approach dialysis catheter. Evaluate for acute or chronic DVT. EXAM: LEFT UPPER EXTREMITY VENOUS DOPPLER ULTRASOUND TECHNIQUE: Gray-scale sonography with graded compression, as well as color Doppler and duplex ultrasound were performed to evaluate the upper extremity deep venous system from the level of the subclavian vein and including the jugular, axillary, basilic, radial, ulnar and upper cephalic vein. Spectral Doppler was utilized to evaluate  flow at rest and with distal augmentation maneuvers. COMPARISON:  Chest radiograph - 06/19/2017 FINDINGS: Contralateral Subclavian Vein: Not visualized secondary to presence of right jugular approach dialysis catheter and associated bandage. Internal Jugular Vein: No evidence of thrombus. Normal compressibility, respiratory phasicity and response to augmentation. Subclavian Vein: No evidence of thrombus. Normal compressibility, respiratory phasicity and response to augmentation. Axillary Vein: No evidence of thrombus. Normal compressibility, respiratory phasicity and response to augmentation. Cephalic Vein: No evidence of thrombus. Normal compressibility, respiratory phasicity and response to augmentation. Basilic Vein: No evidence of thrombus. Normal compressibility, respiratory phasicity and response to augmentation. Brachial Veins: No evidence of thrombus. Normal compressibility, respiratory phasicity and response to augmentation. Radial Veins: No evidence of thrombus. Normal compressibility, respiratory phasicity and response to augmentation. Ulnar Veins: No evidence of thrombus. Normal compressibility, respiratory phasicity and response to augmentation. Venous Reflux:  None visualized. Other Findings:  None visualized. IMPRESSION: No evidence of acute or chronic DVT within the left upper extremity. Electronically Signed   By: Sandi Mariscal M.D.   On: 07/08/2017 08:42      Impression / Plan:   [name] is a 66 y.o. y/o female with Black stools that have been present for the last couple of weeks.  The patient has had a hemoglobin that on admission was 9.9 and then went up to 11 and is now back to 9.9.  She did have her stool sent off for blood and they were heme positive.  The patient will be set up for an EGD for tomorrow to look for source of her black stools.  The patient has been on by mouth iron but reports that her stools are not hard and they are more like tar. I have discussed risks & benefits  which include, but are not limited to, bleeding, infection, perforation & drug reaction.  The patient agrees with this plan & written consent will be obtained.      Thank you for involving me in the care of this patient.      LOS: 2 days   Shannon Lame, MD  07/08/2017,  1:46 PM   Note: This dictation was prepared with Dragon dictation along with smaller phrase technology. Any transcriptional errors that result from this process are unintentional.

## 2017-07-09 ENCOUNTER — Inpatient Hospital Stay: Payer: Medicare Other | Admitting: Anesthesiology

## 2017-07-09 ENCOUNTER — Encounter
Admission: EM | Disposition: A | Discharge status: Skilled Nursing Facility | Payer: Self-pay | Source: Home / Self Care | Attending: Internal Medicine

## 2017-07-09 DIAGNOSIS — E43 Unspecified severe protein-calorie malnutrition: Secondary | ICD-10-CM | POA: Insufficient documentation

## 2017-07-09 DIAGNOSIS — K31811 Angiodysplasia of stomach and duodenum with bleeding: Secondary | ICD-10-CM

## 2017-07-09 DIAGNOSIS — D62 Acute posthemorrhagic anemia: Secondary | ICD-10-CM

## 2017-07-09 HISTORY — PX: ESOPHAGOGASTRODUODENOSCOPY (EGD) WITH PROPOFOL: SHX5813

## 2017-07-09 LAB — GASTROINTESTINAL PANEL BY PCR, STOOL (REPLACES STOOL CULTURE)
Adenovirus F40/41: NOT DETECTED
Astrovirus: NOT DETECTED
CAMPYLOBACTER SPECIES: NOT DETECTED
CYCLOSPORA CAYETANENSIS: NOT DETECTED
Cryptosporidium: NOT DETECTED
ENTAMOEBA HISTOLYTICA: NOT DETECTED
ENTEROTOXIGENIC E COLI (ETEC): NOT DETECTED
Enteroaggregative E coli (EAEC): NOT DETECTED
Enteropathogenic E coli (EPEC): NOT DETECTED
Giardia lamblia: NOT DETECTED
NOROVIRUS GI/GII: NOT DETECTED
PLESIMONAS SHIGELLOIDES: NOT DETECTED
Rotavirus A: NOT DETECTED
SALMONELLA SPECIES: NOT DETECTED
SAPOVIRUS (I, II, IV, AND V): NOT DETECTED
SHIGA LIKE TOXIN PRODUCING E COLI (STEC): NOT DETECTED
SHIGELLA/ENTEROINVASIVE E COLI (EIEC): NOT DETECTED
VIBRIO CHOLERAE: NOT DETECTED
Vibrio species: NOT DETECTED
Yersinia enterocolitica: NOT DETECTED

## 2017-07-09 LAB — GLUCOSE, CAPILLARY
GLUCOSE-CAPILLARY: 102 mg/dL — AB (ref 65–99)
Glucose-Capillary: 70 mg/dL (ref 65–99)
Glucose-Capillary: 139 mg/dL — ABNORMAL HIGH (ref 65–99)
Glucose-Capillary: 80 mg/dL (ref 65–99)
Glucose-Capillary: 99 mg/dL (ref 65–99)

## 2017-07-09 LAB — HEPATITIS B CORE ANTIBODY, TOTAL: HEP B C TOTAL AB: NEGATIVE

## 2017-07-09 LAB — CLOSTRIDIUM DIFFICILE BY PCR: CDIFFPCR: POSITIVE — AB

## 2017-07-09 LAB — HEPATITIS B SURFACE ANTIGEN: Hepatitis B Surface Ag: NEGATIVE

## 2017-07-09 LAB — C DIFFICILE QUICK SCREEN W PCR REFLEX
C DIFFICLE (CDIFF) ANTIGEN: POSITIVE — AB
C Diff toxin: NEGATIVE

## 2017-07-09 LAB — HEMOGLOBIN: Hemoglobin: 9.7 g/dL — ABNORMAL LOW (ref 12.0–16.0)

## 2017-07-09 LAB — HEPATITIS B SURFACE ANTIBODY,QUALITATIVE: Hep B S Ab: NONREACTIVE

## 2017-07-09 SURGERY — ESOPHAGOGASTRODUODENOSCOPY (EGD) WITH PROPOFOL
Anesthesia: General

## 2017-07-09 MED ORDER — PROPOFOL 500 MG/50ML IV EMUL
INTRAVENOUS | Status: AC
  Filled 2017-07-09: qty 50

## 2017-07-09 MED ORDER — COLLAGENASE 250 UNIT/GM EX OINT
TOPICAL_OINTMENT | Freq: Every day | CUTANEOUS | Status: DC
  Administered 2017-07-09 – 2017-07-13 (×4): via TOPICAL
  Filled 2017-07-09: qty 30

## 2017-07-09 MED ORDER — ZINC SULFATE 220 (50 ZN) MG PO CAPS
220.0000 mg | ORAL_CAPSULE | Freq: Every day | ORAL | Status: DC
  Administered 2017-07-09 – 2017-07-13 (×5): 220 mg via ORAL
  Filled 2017-07-09 (×5): qty 1

## 2017-07-09 MED ORDER — SODIUM CHLORIDE 0.9 % IV SOLN
INTRAVENOUS | Status: DC | PRN
  Administered 2017-07-09: 12:00:00 via INTRAVENOUS

## 2017-07-09 MED ORDER — DEXTROSE 5 % IV SOLN
INTRAVENOUS | Status: DC
  Administered 2017-07-09: 13:00:00 via INTRAVENOUS

## 2017-07-09 MED ORDER — DEXTROSE 50 % IV SOLN
25.0000 mL | Freq: Once | INTRAVENOUS | Status: AC
  Administered 2017-07-09: 25 mL via INTRAVENOUS
  Filled 2017-07-09: qty 50

## 2017-07-09 MED ORDER — VITAMIN C 500 MG PO TABS
500.0000 mg | ORAL_TABLET | Freq: Two times a day (BID) | ORAL | Status: DC
  Administered 2017-07-09 – 2017-07-13 (×7): 500 mg via ORAL
  Filled 2017-07-09 (×9): qty 1

## 2017-07-09 MED ORDER — LIDOCAINE HCL (PF) 2 % IJ SOLN
INTRAMUSCULAR | Status: AC
  Filled 2017-07-09: qty 4

## 2017-07-09 MED ORDER — LIDOCAINE HCL (CARDIAC) 20 MG/ML IV SOLN
INTRAVENOUS | Status: DC | PRN
  Administered 2017-07-09: 80 mg via INTRAVENOUS

## 2017-07-09 MED ORDER — PROPOFOL 500 MG/50ML IV EMUL
INTRAVENOUS | Status: DC | PRN
Start: 2017-07-09 — End: 2017-07-09
  Administered 2017-07-09: 75 ug/kg/min via INTRAVENOUS

## 2017-07-09 MED ORDER — PROPOFOL 10 MG/ML IV BOLUS
INTRAVENOUS | Status: DC | PRN
  Administered 2017-07-09 (×2): 20 mg via INTRAVENOUS

## 2017-07-09 MED ORDER — PREMIER PROTEIN SHAKE
11.0000 [oz_av] | Freq: Two times a day (BID) | ORAL | Status: DC
  Administered 2017-07-09 – 2017-07-13 (×7): 11 [oz_av] via ORAL

## 2017-07-09 NOTE — Progress Notes (Signed)
PT Cancellation Note  Patient Details Name: [name] MRN: [id number] DOB: [birth year]   Cancelled Treatment:    Reason Eval/Treat Not Completed: Fatigue/lethargy limiting ability to participate;Other (comment). Treatment attempted; pt soundly sleeping and spouse in the room. Spouse notes pt has had a "rough" day, and would like to allow pt to rest. Spouse request that therapy attempt between 10 and 10:30, so that he may be present. He feels he may better be able to encourage pt for participation.    Larae Grooms, PTA 07/09/2017, 3:29 PM

## 2017-07-09 NOTE — Progress Notes (Signed)
Initial Nutrition Assessment  DOCUMENTATION CODES:   Severe malnutrition in context of chronic illness  INTERVENTION:   Recommend PEG tube placement to help pt meet estimated needs   Check selenium, ascorbic acid, zinc, and vitamin K labs  Premier Protein BID, each supplement provides 160 kcal and 30 grams of protein.   Renal MVI  Vitamin C 547m BID  Zinc 2225mdaily   NUTRITION DIAGNOSIS:   Malnutrition (severe) related to chronic illness (ESRD on HD, CHF) as evidenced by moderate depletions of muscle mass, 18 percent weight loss in <3 weeks, energy intake < or equal to 75% for > or equal to 1 month.  GOAL:   Patient will meet greater than or equal to 90% of their needs  MONITOR:   PO intake, Supplement acceptance, Labs, Weight trends, Skin  REASON FOR ASSESSMENT:   Low Braden    ASSESSMENT:    [age].o. white female with end stage renal disease on hemodialysis, hypertension, atrial fibrillation, hypothyroidism, congestive heart failure admitted for melena and generalized bruising   Met with pt and pt's husband in room today. RD is familiar with this pt from previous admits. Pt with long history of inadequate oral intake and poor wound healing. It was recommended on last admit that pt have PEG tube placement as pt was continuously refusing meals and required a NGT with enteral feeds during hospitalization. [facility] did have an improvement in her appetite and oral intake but the majority of pt's meal intake was <75%. Per pt's husband she has not really eaten hardly anything since returning home. Pt has been drinking small amounts of a 30g protein shake that he has been providing. Pt is tired of Prostat and Nepro; RD will try Premier Protein. Per chart, pt has lost 28lbs(18%) in <3 weeks; this is severe wt loss. Some wt loss is likely related to pt with edema and on HD. Pt is wheelchair bound r/t weakness. Pt noted to have generalized  bruising and dry/thinning hair. Pt reports that she has recently noticed her hair has been falling out and that it has stopped growing. Pt is at high risk for nutrient deficiencies related to her chronic poor appetite, increased needs from wound healing, and increased nutrient losses from hemodialysis. Will check selenium, zinc, vitamin C, and vitamin K labs. Pt also has unstageable sacral wound that is non healing. Even with pt eating 75% of meals, she is not likely to meet her estimated needs via oral intake. Pt would benefit from PEG tube placement. Per pt's husband, he reports that pt has increased energy and strength at times when she is eating well. Pt's last HD treatment was 9/29. Pt s/p EGD today noted to have multiple bleeding angiectasias and gastritis; biopsies pending. Pt s/p I & D 8/20.      Medications reviewed and include: synthroid, megace, MVI, protonix, Dextrose 5% @ 5062mr, zofran    Labs reviewed: BUN 33(H), creat 3.41(H), Ca 8.4(L) AlkPhos 183(H), alb 2.5(L), tbili 1.4(H)- 9/29 Ammonia 17 P 2.8 wnl- 9/11 Folate >63.4- 9/28 B-12- 896- 9/28 Wbc- 13.3(H), Hct 29.4(L)- 9/30 Hgb 9.7(L)-9/30 PT- 27(H)- 9/28  Nutrition-Focused physical exam completed. Findings are no fat depletion, mild/moderate muscle depletion in temporal regions, moderate muscle depletions in hands and clavicles, severe muscle depletion in BLE, and mild generalized edema. Pt also noted to have dull, thinning hair and bruising over entire body.   Diet Order:  Diet NPO time specified  Skin:  Unstageable pressure injury sacrum (2 cm x 2 cm x 2 cm ), incision leg s/p skin graft  Last BM:  10/1- type 7 with melena   Height:   Ht Readings from Last 1 Encounters:  07/06/17 _0  (1.651 m)    Weight:   Wt Readings from Last 1 Encounters:  07/07/17 130 lb 8.2 oz (59.2 kg)    Ideal Body Weight:  56.8 kg  BMI:  Body mass index is 21.72 kg/m.  Estimated Nutritional Needs:   Kcal:  1800-2000kcal/day    Protein:  95-106g/day   Fluid:  1280m per MD  EDUCATION NEEDS:   Education needs addressed  CKoleen DistanceMS, RD, LDN Pager #-[phone] Hours Pager: [phone]

## 2017-07-09 NOTE — Progress Notes (Signed)
While rounding, [place] met husband of Pt in the hallway. Both husband and Pt are familiar from a previous admittance to [facility]. Husband stated that the Pt is experiencing new symptoms and states that she is not herself at this time. Husband is trying to take better care of himself in order to give care to her.    07/09/17 1400  Clinical Encounter Type  Visited With Family;Patient not available  Visit Type Initial;Follow-up;Spiritual support  Referral From Family  Consult/Referral To Chaplain  Spiritual Encounters  Spiritual Needs Prayer;Emotional

## 2017-07-09 NOTE — Progress Notes (Signed)
[facility]  ROUNDING NOTE   Subjective:   Patient seen earlier today.Husband at bedside.  Scheduled for endoscopy today Patient reports being able to tolerate clears last night No nausea, vomiting or shortness of breath this morning   Objective:  Vital signs in last 24 hours:  Temp:  [96.9 F (36.1 C)-98.4 F (36.9 C)] 97.5 F (36.4 C) (10/01 1333) Pulse Rate:  [63-72] 70 (10/01 1333) Resp:  [16-20] 16 (10/01 1333) BP: (104-148)/(51-68) 148/68 (10/01 1333) SpO2:  [100 %] 100 % (10/01 1333)  Weight change:  Filed Weights   07/06/17 2000 07/07/17 1325  Weight: 59.4 kg (131 lb) 59.2 kg (130 lb 8.2 oz)    Intake/Output: I/O last 3 completed shifts: In: 200 [P.O.:200] Out: -    Intake/Output this shift:  Total I/O In: 150 [I.V.:150] Out: -   Physical Exam: General: NAD, laying in bed  Head: Normocephalic, atraumatic. Moist oral mucosal membranes  Eyes: Anicteric,  Neck: Supple, trachea midline  Lungs:  Clear to auscultation  Heart: No rub  Abdomen:  Soft, nontender,   Extremities: No peripheral edema.  Neurologic: Nonfocal, moving all four extremities  Skin: +sacral decub  Access: RIJ permcath, bruising noted around permcath site    Basic Metabolic Panel:  Recent Labs Lab 07/06/17 1604 07/07/17 0333  NA 140 142  K 2.5* 3.5  CL 99* 103  CO2 27 26  GLUCOSE 86 85  BUN 27* 33*  CREATININE 3.02* 3.41*  CALCIUM 8.1* 8.4*    Liver Function Tests:  Recent Labs Lab 07/06/17 1604  AST 41  ALT 34  ALKPHOS 183*  BILITOT 1.4*  PROT 5.4*  ALBUMIN 2.5*   No results for input(s): LIPASE, AMYLASE in the last 168 hours.  Recent Labs Lab 07/08/17 1400  AMMONIA 17    CBC:  Recent Labs Lab 07/06/17 1604  07/07/17 0333 07/07/17 1237 07/07/17 1922 07/08/17 0516 07/09/17 0423  WBC 10.8  --  9.9  --   --  13.3*  --   HGB 9.9*  < > 11.0* 11.0* 9.9* 9.9* 9.7*  HCT 29.0*  --  33.2*  --   --  29.4*  --   MCV 105.1*  --  106.8*  --   --   106.8*  --   PLT 225  --  208  --   --  195  --   < > = values in this interval not displayed.  Cardiac Enzymes: No results for input(s): CKTOTAL, CKMB, CKMBINDEX, TROPONINI in the last 168 hours.  BNP: Invalid input(s): POCBNP  CBG:  Recent Labs Lab 07/08/17 1634 07/08/17 2139 07/09/17 0731 07/09/17 0919 07/09/17 1329  GLUCAP 76 93 70 139* 57    Microbiology: Results for orders placed or performed during the hospital encounter of 07/06/17  MRSA PCR Screening     Status: None   Collection Time: 07/06/17  8:21 PM  Result Value Ref Range Status   MRSA by PCR NEGATIVE NEGATIVE Final    Comment:        The GeneXpert MRSA Assay (FDA approved for NASAL specimens only), is one component of a comprehensive MRSA colonization surveillance program. It is not intended to diagnose MRSA infection nor to guide or monitor treatment for MRSA infections.     Coagulation Studies:  Recent Labs  07/06/17 1604  LABPROT 27.0*  INR 2.52    Urinalysis: No results for input(s): COLORURINE, LABSPEC, PHURINE, GLUCOSEU, HGBUR, BILIRUBINUR, KETONESUR, PROTEINUR, UROBILINOGEN, NITRITE, LEUKOCYTESUR in the last 72  hours.  Invalid input(s): APPERANCEUR    Imaging: US Venous Img Upper Uni Left  Result Date: 07/08/2017 CLINICAL DATA:  History DVT involving the cephalic vein, now with left upper extremity pain and edema. History of type 2 diabetes. Patient is on dialysis with right jugular approach dialysis catheter. Evaluate for acute or chronic DVT. EXAM: LEFT UPPER EXTREMITY VENOUS DOPPLER ULTRASOUND TECHNIQUE: Gray-scale sonography with graded compression, as well as color Doppler and duplex ultrasound were performed to evaluate the upper extremity deep venous system from the level of the subclavian vein and including the jugular, axillary, basilic, radial, ulnar and upper cephalic vein. Spectral Doppler was utilized to evaluate flow at rest and with distal augmentation maneuvers.  COMPARISON:  Chest radiograph - 06/19/2017 FINDINGS: Contralateral Subclavian Vein: Not visualized secondary to presence of right jugular approach dialysis catheter and associated bandage. Internal Jugular Vein: No evidence of thrombus. Normal compressibility, respiratory phasicity and response to augmentation. Subclavian Vein: No evidence of thrombus. Normal compressibility, respiratory phasicity and response to augmentation. Axillary Vein: No evidence of thrombus. Normal compressibility, respiratory phasicity and response to augmentation. Cephalic Vein: No evidence of thrombus. Normal compressibility, respiratory phasicity and response to augmentation. Basilic Vein: No evidence of thrombus. Normal compressibility, respiratory phasicity and response to augmentation. Brachial Veins: No evidence of thrombus. Normal compressibility, respiratory phasicity and response to augmentation. Radial Veins: No evidence of thrombus. Normal compressibility, respiratory phasicity and response to augmentation. Ulnar Veins: No evidence of thrombus. Normal compressibility, respiratory phasicity and response to augmentation. Venous Reflux:  None visualized. Other Findings:  None visualized. IMPRESSION: No evidence of acute or chronic DVT within the left upper extremity. Electronically Signed   By: Sandi Mariscal M.D.   On: 07/08/2017 08:42     Medications:   . dextrose 50 mL/hr at 07/09/17 1324   . amiodarone  200 mg Oral Daily  . collagenase   Topical Daily  . levothyroxine  100 mcg Oral QAC breakfast  . megestrol  40 mg Oral BID  . midodrine  10 mg Oral TID WC  . multivitamin  1 tablet Oral QHS  . pantoprazole (PROTONIX) IV  40 mg Intravenous Q12H  . protein supplement shake  11 oz Oral BID BM  . QUEtiapine  50 mg Oral QHS  . sertraline  25 mg Oral Daily  . sucroferric oxyhydroxide  500 mg Oral TID WC  . vitamin C  500 mg Oral BID  . zinc sulfate  220 mg Oral Daily   acetaminophen **OR** acetaminophen, ALPRAZolam,  haloperidol lactate, ondansetron **OR** ondansetron (ZOFRAN) IV  Assessment/ Plan:  [name] is a 66 y.o. white female with end stage renal disease on hemodialysis, hypertension, atrial fibrillation, hypothyroidism, congestive heart failure   TTS [facility] [place]  1. End stage renal disease: with RIJ permcath.  TTS schedule Next HD planned for tomorrow  2. Hypotension:  - midodrine with treatment  3. Anemia of chronic kidney disease: with GI bleed.   EGD today  4. Secondary Hyperparathyroidism:  - velphoro as outpatient.    LOS: 3 Jamaul Heist 10/1/20182:08 PM

## 2017-07-09 NOTE — Anesthesia Preprocedure Evaluation (Signed)
Anesthesia Evaluation  Patient identified by MRN, date of birth, ID band Patient awake    Reviewed: Allergy & Precautions, NPO status , Patient's Chart, lab work & pertinent test results  History of Anesthesia Complications Negative for: history of anesthetic complications  Airway Mallampati: III  TM Distance: >3 FB Neck ROM: Full    Dental  (+) Poor Dentition   Pulmonary neg pulmonary ROS, neg sleep apnea, neg COPD,    breath sounds clear to auscultation- rhonchi (-) wheezing      Cardiovascular hypertension, Pt. on medications +CHF (diastolic)  (-) CAD, (-) Past MI and (-) Cardiac Stents + dysrhythmias Atrial Fibrillation  Rhythm:Regular Rate:Normal - Systolic murmurs and - Diastolic murmurs    Neuro/Psych PSYCHIATRIC DISORDERS Anxiety Depression negative neurological ROS     GI/Hepatic negative GI ROS, Neg liver ROS,   Endo/Other  diabetesHypothyroidism   Renal/GU ESRF and DialysisRenal disease (last dialysis 07/06/17)     Musculoskeletal negative musculoskeletal ROS (+)   Abdominal (+) - obese,   Peds  Hematology  (+) anemia ,   Anesthesia Other Findings Past Medical History: No date: A-fib (HCC)     Comment:  on eliquis No date: Dysrhythmia No date: ESRD (end stage renal disease) (HCC)     Comment:  on Tue-Thur-Sat dialysis No date: Hypertension No date: Hypothyroidism No date: Thyroid disease   Reproductive/Obstetrics                             Anesthesia Physical Anesthesia Plan  ASA: IV  Anesthesia Plan: General   Post-op Pain Management:    Induction: Intravenous  PONV Risk Score and Plan: 2 and Propofol infusion  Airway Management Planned: Natural Airway  Additional Equipment:   Intra-op Plan:   Post-operative Plan:   Informed Consent: I have reviewed the patients History and Physical, chart, labs and discussed the procedure including the risks, benefits  and alternatives for the proposed anesthesia with the patient or authorized representative who has indicated his/her understanding and acceptance.   Dental advisory given  Plan Discussed with: CRNA and Anesthesiologist  Anesthesia Plan Comments:         Anesthesia Quick Evaluation

## 2017-07-09 NOTE — Progress Notes (Signed)
Patient refused to take medication.  Spoke to daughter who called to ask about her stepmother.  Daughter was under the impression that her mother was getting iv nutrition.  Will pass off information to day shift nurse to make doctor on call aware.  Daughter states that her stepmother became more alert and oriented after a feeding tube was placed in ICU with nutrition.  [name]  07/09/2017  9:43 PM

## 2017-07-09 NOTE — Anesthesia Procedure Notes (Signed)
Date/Time: 07/09/2017 11:44 AM Performed by: Nelda Marseille Pre-anesthesia Checklist: Patient identified, Emergency Drugs available, Suction available, Patient being monitored and Timeout performed Oxygen Delivery Method: Nasal cannula

## 2017-07-09 NOTE — Transfer of Care (Signed)
Immediate Anesthesia Transfer of Care Note  Patient: [name]  Procedure(s) Performed: ESOPHAGOGASTRODUODENOSCOPY (EGD) WITH PROPOFOL (N/A )  Patient Location: PACU  Anesthesia Type:General  Level of Consciousness: sedated  Airway & Oxygen Therapy: Patient Spontanous Breathing and Patient connected to nasal cannula oxygen  Post-op Assessment: Report given to RN and Post -op Vital signs reviewed and stable  Post vital signs: Reviewed and stable  Last Vitals:  Vitals:   07/09/17 0900 07/09/17 1159  BP: 134/60   Pulse: 72   Resp: 18   Temp: 36.7 C (!) 36.1 C  SpO2: 100%     Last Pain:  Vitals:   07/09/17 0900  TempSrc: Oral  PainSc:          Complications: No apparent anesthesia complications

## 2017-07-09 NOTE — Progress Notes (Signed)
Patient ID: [name], female   DOB: 1951-07-08, 66 y.o.   MRN: [id number]  [facility] PROGRESS NOTE  Diara Chaudhari JKD:[id number] DOB: 14-May-1951 DOA: 07/06/2017 PCP: Tracie Harrier, MD  HPI/Subjective: Patient had a relative low sugar required an amp of D50. At that time she was confused. Now as per husband doing a little bit better.  Objective: Vitals:   07/09/17 1159 07/09/17 1333  BP: 123/64 (!) 148/68  Pulse:  70  Resp:  16  Temp: (!) 96.9 F (36.1 C) (!) 97.5 F (36.4 C)  SpO2:  100%    Filed Weights   07/06/17 2000 07/07/17 1325  Weight: 59.4 kg (131 lb) 59.2 kg (130 lb 8.2 oz)    ROS: Review of Systems  Constitutional: Negative for chills and fever.  Eyes: Negative for blurred vision.  Respiratory: Negative for cough and shortness of breath.   Cardiovascular: Negative for chest pain.  Gastrointestinal: Positive for diarrhea and melena. Negative for abdominal pain, constipation, nausea and vomiting.  Genitourinary: Negative for dysuria.  Musculoskeletal: Negative for joint pain.  Neurological: Negative for dizziness and headaches.   Exam: Physical Exam  Constitutional: She is oriented to person, place, and time.  HENT:  Nose: No mucosal edema.  Mouth/Throat: No oropharyngeal exudate or posterior oropharyngeal edema.  Eyes: Pupils are equal, round, and reactive to light. Conjunctivae, EOM and lids are normal.  Neck: No JVD present. Carotid bruit is not present. No edema present. No thyroid mass and no thyromegaly present.  Cardiovascular: S1 normal and S2 normal.  Exam reveals no gallop.   No murmur heard. Pulses:      Dorsalis pedis pulses are 2+ on the right side, and 2+ on the left side.  Respiratory: No respiratory distress. She has no wheezes. She has no rhonchi. She has no rales.  GI: Soft. Bowel sounds are normal. There is no tenderness.  Musculoskeletal:       Right ankle: She exhibits swelling.       Left ankle: She exhibits swelling.   Lymphadenopathy:    She has no cervical adenopathy.  Neurological: She is alert and oriented to person, place, and time. No cranial nerve deficit.  Skin: Skin is warm. Nails show no clubbing.  Patient had skin grafts of her left lower extremity that are covered. Patient has at least a stage III decubitus on the sacrum. Large amount of bruising throughout the entire body  Psychiatric: She has a normal mood and affect.      Data Reviewed: Basic Metabolic Panel:  Recent Labs Lab 07/06/17 1604 07/07/17 0333  NA 140 142  K 2.5* 3.5  CL 99* 103  CO2 27 26  GLUCOSE 86 85  BUN 27* 33*  CREATININE 3.02* 3.41*  CALCIUM 8.1* 8.4*   Liver Function Tests:  Recent Labs Lab 07/06/17 1604  AST 41  ALT 34  ALKPHOS 183*  BILITOT 1.4*  PROT 5.4*  ALBUMIN 2.5*   CBC:  Recent Labs Lab 07/06/17 1604  07/07/17 0333 07/07/17 1237 07/07/17 1922 07/08/17 0516 07/09/17 0423  WBC 10.8  --  9.9  --   --  13.3*  --   HGB 9.9*  < > 11.0* 11.0* 9.9* 9.9* 9.7*  HCT 29.0*  --  33.2*  --   --  29.4*  --   MCV 105.1*  --  106.8*  --   --  106.8*  --   PLT 225  --  208  --   --  195  --   < > =  values in this interval not displayed. BNP (last 3 results)  Recent Labs  04/08/17 0139  BNP 2,012.0*     Recent Results (from the past 240 hour(s))  MRSA PCR Screening     Status: None   Collection Time: 07/06/17  8:21 PM  Result Value Ref Range Status   MRSA by PCR NEGATIVE NEGATIVE Final    Comment:        The GeneXpert MRSA Assay (FDA approved for NASAL specimens only), is one component of a comprehensive MRSA colonization surveillance program. It is not intended to diagnose MRSA infection nor to guide or monitor treatment for MRSA infections.      Scheduled Meds: . amiodarone  200 mg Oral Daily  . collagenase   Topical Daily  . levothyroxine  100 mcg Oral QAC breakfast  . megestrol  40 mg Oral BID  . midodrine  10 mg Oral TID WC  . multivitamin  1 tablet Oral QHS  .  pantoprazole (PROTONIX) IV  40 mg Intravenous Q12H  . protein supplement shake  11 oz Oral BID BM  . QUEtiapine  50 mg Oral QHS  . sertraline  25 mg Oral Daily  . sucroferric oxyhydroxide  500 mg Oral TID WC  . vitamin C  500 mg Oral BID  . zinc sulfate  220 mg Oral Daily    Assessment/Plan:  1. Hypotension and bradycardia. Hold metoprolol. Restart lower dose amiodarone. Both have improved. 2. Melena with upper GI bleed. Hold Eliquis and monitor. Watch hemoglobin closely. Last hemoglobin 9.7. On Protonix IV.  EGD showing gastritis and angiectasia's that were cauterized. Continue to hold anticoagulation at this point. 3. Atrial fibrillation with bradycardia. Hold metoprolol. Continue lower dose amiodarone.  Hold Eliquis with GI bleed. Risk of stroke higher off anticoagulation 4. Recent left upper extremity DVT. Ultrasound of the left upper extremity negative for DVT. 5. End-stage renal disease on hemodialysis. Patient had hemodialysis yesterday 6. Hypothyroidism unspecified on Synthroid 7. Stage III decubitus ulcer present on admission. Asked nurse to put a wet to dry until wound care nurse evaluates. 8. Physical therapy evaluation. 9. Hallucinations. On Seroquel at night. Add IV Haldol when necessary during the day. Husband thinks it secondary to the increasing the Zoloft dose. This was decreased down to 25 mg daily.  Code Status:     Code Status Orders        Start     Ordered   07/06/17 1852  Full code  Continuous     07/06/17 1851    Code Status History    Date Active Date Inactive Code Status Order ID Comments User Context   06/06/2017  6:10 PM 06/06/2017  6:10 PM Full Code [id number]  Bary Leriche, PA-C Inpatient   06/06/2017  6:10 PM 06/19/2017  6:49 PM Full Code [id number]  Bary Leriche, PA-C Inpatient   06/03/2017 12:44 AM 06/06/2017  5:51 PM Full Code [id number]  Etta Quill, DO Inpatient   05/07/2017  6:46 PM 06/03/2017 12:44 AM Full Code [id number]  Bary Leriche, PA-C  Inpatient   05/07/2017  6:46 PM 05/07/2017  6:46 PM Full Code [id number]  Bary Leriche, PA-C Inpatient   05/01/2017 11:47 AM 05/07/2017  6:18 PM Full Code [id number]  Lavonia Dana, MD Inpatient   04/19/2017 10:50 AM 05/01/2017 11:47 AM DNR [id number]  Flora Lipps, MD Inpatient   04/08/2017  6:50 AM 04/19/2017 10:50 AM Full Code [id number]  Harrie Foreman, MD Inpatient  Family Communication: husband and other family member at the bedside Disposition Plan: to be determined  Consultants:  gastroenterology  Time spent: 25 minutes  Loletha Grayer  [employer]

## 2017-07-09 NOTE — Anesthesia Postprocedure Evaluation (Signed)
Anesthesia Post Note  Patient: [name]  Procedure(s) Performed: ESOPHAGOGASTRODUODENOSCOPY (EGD) WITH PROPOFOL (N/A )  Patient location during evaluation: Endoscopy Anesthesia Type: General Level of consciousness: awake and alert and oriented Pain management: pain level controlled Vital Signs Assessment: post-procedure vital signs reviewed and stable Respiratory status: spontaneous breathing, nonlabored ventilation and respiratory function stable Cardiovascular status: blood pressure returned to baseline and stable Postop Assessment: no signs of nausea or vomiting Anesthetic complications: no     Last Vitals:  Vitals:   07/09/17 0900 07/09/17 1159  BP: 134/60 123/64  Pulse: 72   Resp: 18   Temp: 36.7 C (!) 36.1 C  SpO2: 100%     Last Pain:  Vitals:   07/09/17 1159  TempSrc: Tympanic  PainSc:                  [name]

## 2017-07-09 NOTE — Consult Note (Addendum)
[place] Nurse wound consult note Reason for Consult:Nonhealing wound to left lower extremity.  Acell placement in mid September.  Has been receiving HH since then.  Last orders from 9/18 were for collagen every other day.  Explained to patient that we do not have collagen dressings on formulary.  She states she has none at home and that they have been using petrolatum gauze dressing.  Odor and drainage noted to wound today.  Will begin Xeroform gauze dressing to wound bed.  No erythema, warmth or pain noted.    Unstageable pressure injury to sacrum, present on this admission. 100% slough to wound bed and periwound from 7 to 12 o'clock Wound type:Chronic nonhealing wound, full thickness Pressure Injury POA: NA Measurement: Proximal wound 1 cm x 0.5 cm x 0.2 cm  Distal wound (left anterior lower leg):  6.4 cm x 3 cm x 0.2 cm  Sacrum:  Wound is 2 cm x 2 cm x 2 cm with slough to wound bed circumferential tissue loss from 7 to 12 o'clock extends 1.3 cm and is 100% adherent slough.  Will begin enzymatic debridement. Albumin is 2.5 and spouse at bedside.  Explained role of albumin in wound healing and that her nutrition must be improved for new tissue growth.   Will order low air loss bed for pressure redistribution and improve skin microclimate.  Wound bed: granulation tissue noted.   Drainage (amount, consistency, odor) Moderate creamy, thick drainage with musty odor.  Periwound:intact Dressing procedure/placement/frequency:Cleanse wounds to left lower leg with NS.  Apply Xeroform gauze to wound bed and cover with 4x4 gauze, kerlix and ace wrap.  Change three times weekly on Monday/Wednesday/Friday. Bedside Rn to perform  Cleanse sacral wound and periwound with NS.  Apply Santyl to all areas of slough.  Fill wound defect with Ns moist 2x2 gauze.  Cover with silicone border foam dressing. Change daily.  Turn and reposition every two hours.  NO DISPOSABLE briefs or underpads.  Will not follow at this time.   Please re-consult if needed.  Domenic Moras RN BSN [place] Pager [phone]

## 2017-07-09 NOTE — Anesthesia Post-op Follow-up Note (Signed)
Anesthesia QCDR form completed.        

## 2017-07-09 NOTE — OR Nursing (Signed)
REPORT CALLED TO TERRY,RN ON FLOOR . PT HAD LARGE LOOSE GREEN STOOL WHICH SOILED SACRAL PAD AND DECUBITUS DRESSING. ALL REMOVED AND DRESSING APPLIED. PT TO RETURN TO ROOM ON CLEAR LIQUIDS. SLIGHT AGITATION NOTED. MOUTH CARE GIVEN

## 2017-07-09 NOTE — Op Note (Signed)
[facility] Patient Name: [name] Procedure Date: 07/09/2017 11:31 AM MRN: [id number] Account #: [id number] Date of Birth: 02/27/1951 Admit Type: Inpatient Age: 66 Room: [facility] ENDO ROOM 4 Gender: Female Note Status: Finalized Procedure:            Upper GI endoscopy Indications:          Acute post hemorrhagic anemia, Iron deficiency anemia                        secondary to chronic blood loss Providers:            Lucilla Lame MD, MD Referring MD:         Tracie Harrier, MD (Referring MD) Medicines:            Propofol per Anesthesia Complications:        No immediate complications. Procedure:            Pre-Anesthesia Assessment:                       - Prior to the procedure, a History and Physical was                        performed, and patient medications and allergies were                        reviewed. The patient's tolerance of previous                        anesthesia was also reviewed. The risks and benefits of                        the procedure and the sedation options and risks were                        discussed with the patient. All questions were                        answered, and informed consent was obtained. Prior                        Anticoagulants: The patient has taken no previous                        anticoagulant or antiplatelet agents. ASA Grade                        Assessment: II - A patient with mild systemic disease.                        After reviewing the risks and benefits, the patient was                        deemed in satisfactory condition to undergo the                        procedure.                       After obtaining informed consent, the endoscope was  passed under direct vision. Throughout the procedure,                        the patient's blood pressure, pulse, and oxygen                        saturations were monitored continuously. The Endoscope                   was introduced through the mouth, and advanced to the                        second part of duodenum. The upper GI endoscopy was                        accomplished without difficulty. The patient tolerated                        the procedure well. Findings:      The examined esophagus was normal.      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       Coagulation for hemostasis using argon beam at 2 liters/minute and 30       watts was successful.      Diffuse moderate inflammation characterized by erythema was found in the       gastric antrum. Biopsies were taken with a cold forceps for histology.      The examined duodenum was normal. Impression:           - Normal esophagus.                       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                        Treated with argon beam coagulation.                       - Gastritis. Biopsied.                       - Normal examined duodenum. Recommendation:       - Return patient to hospital ward for ongoing care.                       - Clear liquid diet.                       - Continue present medications. Procedure Code(s):    --- Professional ---                       24097, 26, Esophagogastroduodenoscopy, flexible,                        transoral; with control of bleeding, any method                       43239, Esophagogastroduodenoscopy, flexible, transoral;                        with biopsy, single or multiple Diagnosis Code(s):    --- Professional ---  D62, Acute posthemorrhagic anemia                       D50.0, Iron deficiency anemia secondary to blood loss                        (chronic)                       K31.811, Angiodysplasia of stomach and duodenum with                        bleeding                       K29.70, Gastritis, unspecified, without bleeding CPT copyright 2016 American Medical Association. All rights reserved. The codes  documented in this report are preliminary and upon coder review may  be revised to meet current compliance requirements. Lucilla Lame MD, MD 07/09/2017 11:55:25 AM This report has been signed electronically. Number of Addenda: 0 Note Initiated On: 07/09/2017 11:31 AM      [facility]

## 2017-07-10 ENCOUNTER — Encounter: Payer: Self-pay | Admitting: Gastroenterology

## 2017-07-10 LAB — GLUCOSE, CAPILLARY
GLUCOSE-CAPILLARY: 111 mg/dL — AB (ref 65–99)
GLUCOSE-CAPILLARY: 89 mg/dL (ref 65–99)
GLUCOSE-CAPILLARY: 120 mg/dL — AB (ref 65–99)
Glucose-Capillary: 100 mg/dL — ABNORMAL HIGH (ref 65–99)

## 2017-07-10 LAB — RENAL FUNCTION PANEL
Albumin: 2.2 g/dL — ABNORMAL LOW (ref 3.5–5.0)
Anion gap: 7 (ref 5–15)
BUN: 34 mg/dL — ABNORMAL HIGH (ref 6–20)
CHLORIDE: 101 mmol/L (ref 101–111)
CO2: 26 mmol/L (ref 22–32)
CREATININE: 3.97 mg/dL — AB (ref 0.44–1.00)
Calcium: 8.2 mg/dL — ABNORMAL LOW (ref 8.9–10.3)
GFR calc non Af Amer: 11 mL/min — ABNORMAL LOW (ref >60)
GFR, EST AFRICAN AMERICAN: 13 mL/min — AB (ref >60)
GLUCOSE: 109 mg/dL — AB (ref 65–99)
Phosphorus: <1 mg/dL — CL (ref 2.5–4.6)
Potassium: 3.7 mmol/L (ref 3.5–5.1)
Sodium: 134 mmol/L — ABNORMAL LOW (ref 135–145)

## 2017-07-10 LAB — CBC
HCT: 29.3 % — ABNORMAL LOW (ref 35.0–47.0)
Hemoglobin: 9.8 g/dL — ABNORMAL LOW (ref 12.0–16.0)
MCH: 36 pg — AB (ref 26.0–34.0)
MCHC: 33.5 g/dL (ref 32.0–36.0)
MCV: 107.4 fL — AB (ref 80.0–100.0)
PLATELETS: 180 10*3/uL (ref 150–440)
RBC: 2.73 MIL/uL — ABNORMAL LOW (ref 3.80–5.20)
RDW: 20 % — AB (ref 11.5–14.5)
WBC: 12.6 10*3/uL — ABNORMAL HIGH (ref 3.6–11.0)

## 2017-07-10 LAB — TSH: TSH: 3.206 u[IU]/mL (ref 0.350–4.500)

## 2017-07-10 LAB — ZINC: Zinc: 58 ug/dL (ref 56–134)

## 2017-07-10 LAB — SURGICAL PATHOLOGY

## 2017-07-10 MED ORDER — METOPROLOL TARTRATE 25 MG PO TABS
12.5000 mg | ORAL_TABLET | Freq: Two times a day (BID) | ORAL | Status: DC
  Administered 2017-07-10 – 2017-07-13 (×5): 12.5 mg via ORAL
  Filled 2017-07-10 (×6): qty 1

## 2017-07-10 MED ORDER — VANCOMYCIN 50 MG/ML ORAL SOLUTION
125.0000 mg | Freq: Four times a day (QID) | ORAL | Status: DC
  Administered 2017-07-10 – 2017-07-13 (×12): 125 mg via ORAL
  Filled 2017-07-10 (×16): qty 2.5

## 2017-07-10 MED ORDER — POTASSIUM PHOSPHATES 15 MMOLE/5ML IV SOLN
20.0000 mmol | Freq: Once | INTRAVENOUS | Status: AC
  Administered 2017-07-10: 20 mmol via INTRAVENOUS
  Filled 2017-07-10: qty 6.67

## 2017-07-10 NOTE — Progress Notes (Signed)
Pre hd assessment  

## 2017-07-10 NOTE — Plan of Care (Signed)
Problem: Education: Goal: Knowledge of [place] [facility] Education information/materials will improve Outcome: Not Progressing Patient is confused.  Problem: Health Behavior/Discharge Planning: Goal: Ability to manage health-related needs will improve Outcome: Not Progressing Patient is confused.  Problem: Fluid Volume: Goal: Ability to maintain a balanced intake and output will improve Outcome: Not Progressing Patient has poor appetite and refuses to eat.  Problem: Nutrition: Goal: Adequate nutrition will be maintained Outcome: Not Progressing Patient has poor appetite.

## 2017-07-10 NOTE — Progress Notes (Signed)
Post hd vitals 

## 2017-07-10 NOTE — Progress Notes (Signed)
Post hd assessment 

## 2017-07-10 NOTE — Progress Notes (Signed)
  End of hd 

## 2017-07-10 NOTE — Progress Notes (Signed)
Pre hd vitals 

## 2017-07-10 NOTE — Progress Notes (Signed)
Physical Therapy Treatment Patient Details Name: [name] MRN: [id number] DOB: 13-Jul-1951 Today's Date: 07/10/2017    History of Present Illness Patient is a 66 y.o. female admitted on 15 SEP with anemia and rectal bleeding. PMH includes atrial fibrillation, ESRD with hemodialysis Tuesday, Thursday, and Saturday, and hypertension.  More recently, patient has been at home with husband in w/c, receiving HHPT.    PT Comments    Participated in exercises as described below.  Pt and husband stated they use +2 assist with sliding board at home but it is becoming increasingly difficulty.  Pt agrees to sit edge of bed this am (out of bed deferred as dialysis team is expected shortly for session).  Pt puts little effort into assisting mobility skills today relying fully on Probation officer.  She sat edge of bed for less than on minute before laying her trunk back down on the bed due to painful coccyx wound.  Pt on pressure relief bed.  Positioned for comfort.   Follow Up Recommendations  SNF     Equipment Recommendations       Recommendations for Other Services       Precautions / Restrictions Precautions Precautions: Fall Precaution Comments: R IJ perm cath; LUE DVT; wounds buttocks Restrictions Weight Bearing Restrictions: No    Mobility  Bed Mobility Overal bed mobility: Needs Assistance Bed Mobility: Rolling;Supine to Sit;Sit to Supine Rolling: Max assist;Total assist   Supine to sit: Max assist;Total assist Sit to supine: Max assist;Total assist   General bed mobility comments: Pt with little to no effort to assist with bed mobility and supine to/from sit transfers  Transfers                    Ambulation/Gait                 Stairs            Wheelchair Mobility    Modified Rankin (Stroke Patients Only)       Balance Overall balance assessment: Needs assistance Sitting-balance support: Single extremity supported;Feet supported Sitting balance-Leahy  Scale: Poor Sitting balance - Comments: Patient leaning  Postural control: Right lateral lean                                  Cognition Arousal/Alertness: Awake/alert Behavior During Therapy: Flat affect Overall Cognitive Status: Impaired/Different from baseline                                        Exercises Other Exercises Other Exercises: BLE  AAROM ankle pumps, heel slides, SLR and ab/add x 10    General Comments        Pertinent Vitals/Pain Faces Pain Scale: Hurts even more Pain Location: Buttock Pain Descriptors / Indicators: Sore Pain Intervention(s): Limited activity within patient's tolerance;Monitored during session    Home Living                      Prior Function            PT Goals (current goals can now be found in the care plan section)      Frequency    Min 2X/week      PT Plan Current plan remains appropriate    Co-evaluation  AM-PAC PT "6 Clicks" Daily Activity  Outcome Measure  Difficulty turning over in bed (including adjusting bedclothes, sheets and blankets)?: Unable Difficulty moving from lying on back to sitting on the side of the bed? : Unable Difficulty sitting down on and standing up from a chair with arms (e.g., wheelchair, bedside commode, etc,.)?: Unable Help needed moving to and from a bed to chair (including a wheelchair)?: Total Help needed walking in hospital room?: Total Help needed climbing 3-5 steps with a railing? : Total 6 Click Score: 6    End of Session   Activity Tolerance: Patient limited by pain;Other (comment) Patient left: in bed;with bed alarm set;with call bell/phone within reach;with family/visitor present;with nursing/sitter in room         Time: 8921-1941 PT Time Calculation (min) (ACUTE ONLY): 20 min  Charges:  $Therapeutic Exercise: 8-22 mins                    G Codes:       Chesley Noon, PTA 07/10/17, 10:31 AM

## 2017-07-10 NOTE — Care Management (Signed)
Chronic HD patient.  Elvera Bicker HD liaison aware of admission.

## 2017-07-10 NOTE — Progress Notes (Signed)
Hd start, pt alert, no c/o, stable, calm, report from primary rn

## 2017-07-10 NOTE — Progress Notes (Signed)
[facility]  ROUNDING NOTE   Subjective:   Patient seen earlier today. Husband at bedside.  endoscopy showed multiple bleeding angioectates he is in the stomach which were treated by argon beam coagulation Appetite is poor.  Patient is trying small amount of clears Tested positive for C. Difficile Scheduled for hemodialysis today  Objective:  Vital signs in last 24 hours:  Temp:  [97.3 F (36.3 C)-98.4 F (36.9 C)] 97.8 F (36.6 C) (10/02 1400) Pulse Rate:  [60-107] 88 (10/02 1410) Resp:  [14-18] 14 (10/02 1410) BP: (86-151)/(48-78) 117/65 (10/02 1410) SpO2:  [98 %-100 %] 98 % (10/02 0455)  Weight change:  Filed Weights   07/06/17 2000 07/07/17 1325  Weight: 59.4 kg (131 lb) 59.2 kg (130 lb 8.2 oz)    Intake/Output: I/O last 3 completed shifts: In: 210 [P.O.:60; I.V.:150] Out: -    Intake/Output this shift:  No intake/output data recorded.  Physical Exam: General: NAD, laying in bed  Head: Normocephalic, atraumatic. Moist oral mucosal membranes  Eyes: Anicteric,  Neck: Supple, trachea midline  Lungs:  Clear to auscultation  Heart: No rub  Abdomen:  Soft, nontender,   Extremities: No peripheral edema.  Neurologic: Nonfocal, moving all four extremities  Skin: +sacral decub  Access: RIJ permcath, bruising noted around permcath site    Basic Metabolic Panel:  Recent Labs Lab 07/06/17 1604 07/07/17 0333 07/10/17 1101  NA 140 142 134*  K 2.5* 3.5 3.7  CL 99* 103 101  CO2 27 26 26   GLUCOSE 86 85 109*  BUN 27* 33* 34*  CREATININE 3.02* 3.41* 3.97*  CALCIUM 8.1* 8.4* 8.2*  PHOS  --   --  <1.0*    Liver Function Tests:  Recent Labs Lab 07/06/17 1604 07/10/17 1101  AST 41  --   ALT 34  --   ALKPHOS 183*  --   BILITOT 1.4*  --   PROT 5.4*  --   ALBUMIN 2.5* 2.2*   No results for input(s): LIPASE, AMYLASE in the last 168 hours.  Recent Labs Lab 07/08/17 1400  AMMONIA 17    CBC:  Recent Labs Lab 07/06/17 1604  07/07/17 0333  07/07/17 1237 07/07/17 1922 07/08/17 0516 07/09/17 0423 07/10/17 1101  WBC 10.8  --  9.9  --   --  13.3*  --  12.6*  HGB 9.9*  < > 11.0* 11.0* 9.9* 9.9* 9.7* 9.8*  HCT 29.0*  --  33.2*  --   --  29.4*  --  29.3*  MCV 105.1*  --  106.8*  --   --  106.8*  --  107.4*  PLT 225  --  208  --   --  195  --  180  < > = values in this interval not displayed.  Cardiac Enzymes: No results for input(s): CKTOTAL, CKMB, CKMBINDEX, TROPONINI in the last 168 hours.  BNP: Invalid input(s): POCBNP  CBG:  Recent Labs Lab 07/09/17 1329 07/09/17 1718 07/09/17 2115 07/10/17 0745 07/10/17 1157  GLUCAP 80 102* 99 111* 100*    Microbiology: Results for orders placed or performed during the hospital encounter of 07/06/17  MRSA PCR Screening     Status: None   Collection Time: 07/06/17  8:21 PM  Result Value Ref Range Status   MRSA by PCR NEGATIVE NEGATIVE Final    Comment:        The GeneXpert MRSA Assay (FDA approved for NASAL specimens only), is one component of a comprehensive MRSA colonization surveillance program. It  is not intended to diagnose MRSA infection nor to guide or monitor treatment for MRSA infections.   Gastrointestinal Panel by PCR , Stool     Status: None   Collection Time: 07/09/17  1:48 PM  Result Value Ref Range Status   Campylobacter species NOT DETECTED NOT DETECTED Final   Plesimonas shigelloides NOT DETECTED NOT DETECTED Final   Salmonella species NOT DETECTED NOT DETECTED Final   Yersinia enterocolitica NOT DETECTED NOT DETECTED Final   Vibrio species NOT DETECTED NOT DETECTED Final   Vibrio cholerae NOT DETECTED NOT DETECTED Final   Enteroaggregative E coli (EAEC) NOT DETECTED NOT DETECTED Final   Enteropathogenic E coli (EPEC) NOT DETECTED NOT DETECTED Final   Enterotoxigenic E coli (ETEC) NOT DETECTED NOT DETECTED Final   Shiga like toxin producing E coli (STEC) NOT DETECTED NOT DETECTED Final   Shigella/Enteroinvasive E coli (EIEC) NOT DETECTED NOT  DETECTED Final   Cryptosporidium NOT DETECTED NOT DETECTED Final   Cyclospora cayetanensis NOT DETECTED NOT DETECTED Final   Entamoeba histolytica NOT DETECTED NOT DETECTED Final   Giardia lamblia NOT DETECTED NOT DETECTED Final   Adenovirus F40/41 NOT DETECTED NOT DETECTED Final   Astrovirus NOT DETECTED NOT DETECTED Final   Norovirus GI/GII NOT DETECTED NOT DETECTED Final   Rotavirus A NOT DETECTED NOT DETECTED Final   Sapovirus (I, II, IV, and V) NOT DETECTED NOT DETECTED Final  C difficile quick scan w PCR reflex     Status: Abnormal   Collection Time: 07/09/17  1:48 PM  Result Value Ref Range Status   C Diff antigen POSITIVE (A) NEGATIVE Final   C Diff toxin NEGATIVE NEGATIVE Final   C Diff interpretation Results are indeterminate. See PCR results.  Final  Clostridium Difficile by PCR     Status: Abnormal   Collection Time: 07/09/17  1:48 PM  Result Value Ref Range Status   Toxigenic C Difficile by pcr POSITIVE (A) NEGATIVE Final    Comment: Positive for toxigenic C. difficile with little to no toxin production. Only treat if clinical presentation suggests symptomatic illness.    Coagulation Studies: No results for input(s): LABPROT, INR in the last 72 hours.  Urinalysis: No results for input(s): COLORURINE, LABSPEC, PHURINE, GLUCOSEU, HGBUR, BILIRUBINUR, KETONESUR, PROTEINUR, UROBILINOGEN, NITRITE, LEUKOCYTESUR in the last 72 hours.  Invalid input(s): APPERANCEUR    Imaging: No results found.   Medications:    . amiodarone  200 mg Oral Daily  . collagenase   Topical Daily  . levothyroxine  100 mcg Oral QAC breakfast  . megestrol  40 mg Oral BID  . metoprolol tartrate  12.5 mg Oral BID  . midodrine  10 mg Oral TID WC  . multivitamin  1 tablet Oral QHS  . pantoprazole (PROTONIX) IV  40 mg Intravenous Q12H  . protein supplement shake  11 oz Oral BID BM  . QUEtiapine  50 mg Oral QHS  . sertraline  25 mg Oral Daily  . sucroferric oxyhydroxide  500 mg Oral TID WC  .  vancomycin  125 mg Oral Q6H  . vitamin C  500 mg Oral BID  . zinc sulfate  220 mg Oral Daily   acetaminophen **OR** acetaminophen, ALPRAZolam, haloperidol lactate, ondansetron **OR** ondansetron (ZOFRAN) IV  Assessment/ Plan:  [name] is a 66 y.o. white female with end stage renal disease on hemodialysis, hypertension, atrial fibrillation, hypothyroidism, congestive heart failure   TTS [facility]  1. End stage renal disease: with RIJ permcath.  TTS  schedule Urinalysis planned for today to be done in the room  2. Hypotension:  - midodrine with treatment  3. Anemia of chronic kidney disease: with GI bleed.   EGD shows bleeding angioectasia is in the stomach  4. Secondary Hyperparathyroidism:  - velphoro as outpatient.  - hold for now.  May restart once patient is able to take normal oral intake   LOS: 4 Latwan Luchsinger 10/2/20182:49 PM

## 2017-07-10 NOTE — Clinical Social Work Note (Signed)
CSW spoke with patient's husband today to extend bed offers. Patient's husband wishes to do some research on the facilities that offered. Patient's husband also stated that tomorrow he will be having a procedure on his eyes and won't be able to see until about 4 or 5pm so he will not be able to come to the hospital tomorrow. Shela Leff MSW,LCSW [phone]

## 2017-07-10 NOTE — Progress Notes (Signed)
Patient ID: [name], female   DOB: 03/31/1951, 66 y.o.   MRN: [id number]  [facility] PROGRESS NOTE  [name] MPN:[id number] DOB: Jan 16, 1951 DOA: 07/06/2017 PCP: Tracie Harrier, MD  HPI/Subjective: Patient seen earlier and at that time she was saving the binder on her tongue.  Patient feels okay. Still having diarrhea.  Objective: Vitals:   07/10/17 1330 07/10/17 1345  BP: (!) 119/48 (!) 100/56  Pulse: (!) 107 (!) 101  Resp: 16 16  Temp:    SpO2:      Filed Weights   07/06/17 2000 07/07/17 1325  Weight: 59.4 kg (131 lb) 59.2 kg (130 lb 8.2 oz)    ROS: Review of Systems  Constitutional: Negative for chills and fever.  Eyes: Negative for blurred vision.  Respiratory: Negative for cough and shortness of breath.   Cardiovascular: Negative for chest pain.  Gastrointestinal: Positive for diarrhea and melena. Negative for abdominal pain, constipation, nausea and vomiting.  Genitourinary: Negative for dysuria.  Musculoskeletal: Negative for joint pain.  Neurological: Positive for weakness. Negative for dizziness and headaches.   Exam: Physical Exam  HENT:  Nose: No mucosal edema.  Mouth/Throat: No oropharyngeal exudate or posterior oropharyngeal edema.  Eyes: Pupils are equal, round, and reactive to light. Conjunctivae, EOM and lids are normal.  Neck: No JVD present. Carotid bruit is not present. No edema present. No thyroid mass and no thyromegaly present.  Cardiovascular: S1 normal and S2 normal.  Exam reveals no gallop.   No murmur heard. Pulses:      Dorsalis pedis pulses are 2+ on the right side, and 2+ on the left side.  Respiratory: No respiratory distress. She has no wheezes. She has no rhonchi. She has no rales.  GI: Soft. Bowel sounds are normal. There is no tenderness.  Musculoskeletal:       Right ankle: She exhibits swelling.       Left ankle: She exhibits swelling.  Lymphadenopathy:    She has no cervical adenopathy.  Neurological: She is alert.  No cranial nerve deficit.  Skin: Skin is warm. Nails show no clubbing.  Patient had skin grafts of her left lower extremity that are covered. Patient has at least a stage III decubitus on the sacrum. Large amount of bruising throughout the entire body  Psychiatric: She has a normal mood and affect.      Data Reviewed: Basic Metabolic Panel:  Recent Labs Lab 07/06/17 1604 07/07/17 0333 07/10/17 1101  NA 140 142 134*  K 2.5* 3.5 3.7  CL 99* 103 101  CO2 27 26 26   GLUCOSE 86 85 109*  BUN 27* 33* 34*  CREATININE 3.02* 3.41* 3.97*  CALCIUM 8.1* 8.4* 8.2*  PHOS  --   --  <1.0*   Liver Function Tests:  Recent Labs Lab 07/06/17 1604 07/10/17 1101  AST 41  --   ALT 34  --   ALKPHOS 183*  --   BILITOT 1.4*  --   PROT 5.4*  --   ALBUMIN 2.5* 2.2*   CBC:  Recent Labs Lab 07/06/17 1604  07/07/17 0333 07/07/17 1237 07/07/17 1922 07/08/17 0516 07/09/17 0423 07/10/17 1101  WBC 10.8  --  9.9  --   --  13.3*  --  12.6*  HGB 9.9*  < > 11.0* 11.0* 9.9* 9.9* 9.7* 9.8*  HCT 29.0*  --  33.2*  --   --  29.4*  --  29.3*  MCV 105.1*  --  106.8*  --   --  106.8*  --  107.4*  PLT 225  --  208  --   --  195  --  180  < > = values in this interval not displayed. BNP (last 3 results)  Recent Labs  04/08/17 0139  BNP 2,012.0*     Recent Results (from the past 240 hour(s))  MRSA PCR Screening     Status: None   Collection Time: 07/06/17  8:21 PM  Result Value Ref Range Status   MRSA by PCR NEGATIVE NEGATIVE Final    Comment:        The GeneXpert MRSA Assay (FDA approved for NASAL specimens only), is one component of a comprehensive MRSA colonization surveillance program. It is not intended to diagnose MRSA infection nor to guide or monitor treatment for MRSA infections.   Gastrointestinal Panel by PCR , Stool     Status: None   Collection Time: 07/09/17  1:48 PM  Result Value Ref Range Status   Campylobacter species NOT DETECTED NOT DETECTED Final   Plesimonas  shigelloides NOT DETECTED NOT DETECTED Final   Salmonella species NOT DETECTED NOT DETECTED Final   Yersinia enterocolitica NOT DETECTED NOT DETECTED Final   Vibrio species NOT DETECTED NOT DETECTED Final   Vibrio cholerae NOT DETECTED NOT DETECTED Final   Enteroaggregative E coli (EAEC) NOT DETECTED NOT DETECTED Final   Enteropathogenic E coli (EPEC) NOT DETECTED NOT DETECTED Final   Enterotoxigenic E coli (ETEC) NOT DETECTED NOT DETECTED Final   Shiga like toxin producing E coli (STEC) NOT DETECTED NOT DETECTED Final   Shigella/Enteroinvasive E coli (EIEC) NOT DETECTED NOT DETECTED Final   Cryptosporidium NOT DETECTED NOT DETECTED Final   Cyclospora cayetanensis NOT DETECTED NOT DETECTED Final   Entamoeba histolytica NOT DETECTED NOT DETECTED Final   Giardia lamblia NOT DETECTED NOT DETECTED Final   Adenovirus F40/41 NOT DETECTED NOT DETECTED Final   Astrovirus NOT DETECTED NOT DETECTED Final   Norovirus GI/GII NOT DETECTED NOT DETECTED Final   Rotavirus A NOT DETECTED NOT DETECTED Final   Sapovirus (I, II, IV, and V) NOT DETECTED NOT DETECTED Final  C difficile quick scan w PCR reflex     Status: Abnormal   Collection Time: 07/09/17  1:48 PM  Result Value Ref Range Status   C Diff antigen POSITIVE (A) NEGATIVE Final   C Diff toxin NEGATIVE NEGATIVE Final   C Diff interpretation Results are indeterminate. See PCR results.  Final  Clostridium Difficile by PCR     Status: Abnormal   Collection Time: 07/09/17  1:48 PM  Result Value Ref Range Status   Toxigenic C Difficile by pcr POSITIVE (A) NEGATIVE Final    Comment: Positive for toxigenic C. difficile with little to no toxin production. Only treat if clinical presentation suggests symptomatic illness.     Scheduled Meds: . amiodarone  200 mg Oral Daily  . collagenase   Topical Daily  . levothyroxine  100 mcg Oral QAC breakfast  . megestrol  40 mg Oral BID  . midodrine  10 mg Oral TID WC  . multivitamin  1 tablet Oral QHS  .  pantoprazole (PROTONIX) IV  40 mg Intravenous Q12H  . protein supplement shake  11 oz Oral BID BM  . QUEtiapine  50 mg Oral QHS  . sertraline  25 mg Oral Daily  . sucroferric oxyhydroxide  500 mg Oral TID WC  . vancomycin  125 mg Oral Q6H  . vitamin C  500 mg Oral BID  . zinc  sulfate  220 mg Oral Daily    Assessment/Plan:  1. Melena with upper GI bleed. Hold Eliquis and monitor. Watch hemoglobin closely. Last hemoglobin 9.8. On Protonix IV.  EGD showing gastritis and angiectasia's that were cauterized. Continue to hold anticoagulation at this point. Advance diet. 2. Clostridium difficile colitis. I started oral vancomycin. I was concerned that the patient still had diarrhea and sent off stool studies. 3. Atrial fibrillation. Now heart rate starting to creep up. Initially had hypotension and bradycardia. Restart low-dose metoprolol. Continue amiodarone.  Hold Eliquis with GI bleed. Risk of stroke higher off anticoagulation 4. Recent left upper extremity DVT. Ultrasound of the left upper extremity negative for DVT. 5. End-stage renal disease on hemodialysis. Patient had hemodialysis today 6. Hypothyroidism unspecified on Synthroid 7. Stage III decubitus ulcer present on admission.  Appreciate wound care evaluation 8. Physical therapy evaluation. 9. Hallucinations. On Seroquel at night. As needed IV Haldol.  Code Status:     Code Status Orders        Start     Ordered   07/06/17 1852  Full code  Continuous     07/06/17 1851    Code Status History    Date Active Date Inactive Code Status Order ID Comments User Context   06/06/2017  6:10 PM 06/06/2017  6:10 PM Full Code [id number]  Bary Leriche, PA-C Inpatient   06/06/2017  6:10 PM 06/19/2017  6:49 PM Full Code [id number]  Bary Leriche, PA-C Inpatient   06/03/2017 12:44 AM 06/06/2017  5:51 PM Full Code [id number]  Etta Quill, DO Inpatient   05/07/2017  6:46 PM 06/03/2017 12:44 AM Full Code [id number]  Bary Leriche, PA-C Inpatient    05/07/2017  6:46 PM 05/07/2017  6:46 PM Full Code [id number]  Bary Leriche, PA-C Inpatient   05/01/2017 11:47 AM 05/07/2017  6:18 PM Full Code [id number]  Lavonia Dana, MD Inpatient   04/19/2017 10:50 AM 05/01/2017 11:47 AM DNR [id number]  Flora Lipps, MD Inpatient   04/08/2017  6:50 AM 04/19/2017 10:50 AM Full Code [id number]  Harrie Foreman, MD Inpatient     Family Communication: husband yesterday Disposition Plan: likely will need rehabilitation again  Consultants:  gastroenterology  Time spent: 24 minutes  [place], [facility]

## 2017-07-11 ENCOUNTER — Other Ambulatory Visit: Payer: Self-pay | Admitting: Hematology

## 2017-07-11 LAB — GLUCOSE, CAPILLARY
GLUCOSE-CAPILLARY: 87 mg/dL (ref 65–99)
Glucose-Capillary: 83 mg/dL (ref 65–99)
Glucose-Capillary: 75 mg/dL (ref 65–99)
Glucose-Capillary: 93 mg/dL (ref 65–99)

## 2017-07-11 MED ORDER — EPOETIN ALFA 10000 UNIT/ML IJ SOLN
10000.0000 [IU] | INTRAMUSCULAR | Status: DC
  Administered 2017-07-12: 10000 [IU] via INTRAVENOUS
  Filled 2017-07-11: qty 1

## 2017-07-11 NOTE — Clinical Social Work Note (Signed)
Patient and husband have chosen [facility]. CSW has notified [facility]. Shela Leff MSW,LCSW [phone]

## 2017-07-11 NOTE — Progress Notes (Signed)
[facility]  ROUNDING NOTE   Subjective:   Patient seen earlier today.  endoscopy showed multiple bleeding angioectates he is in the stomach which were treated by argon beam coagulation Appetite is poor.  Patient is trying small amount of clears Tested positive for C. Difficile Tolerated hemodialysis yesterday Patient is in a depressed mood today  Objective:  Vital signs in last 24 hours:  Temp:  [98.4 F (36.9 C)-98.7 F (37.1 C)] 98.7 F (37.1 C) (10/03 0621) Pulse Rate:  [77-90] 90 (10/03 0621) Resp:  [18] 18 (10/03 0621) BP: (123-137)/(61-81) 137/81 (10/03 0621) SpO2:  [100 %] 100 % (10/03 0621)  Weight change:  Filed Weights   07/06/17 2000 07/07/17 1325  Weight: 59.4 kg (131 lb) 59.2 kg (130 lb 8.2 oz)    Intake/Output: No intake/output data recorded.   Intake/Output this shift:  No intake/output data recorded.  Physical Exam: General: NAD, laying in bed  Head: Normocephalic, atraumatic. Moist oral mucosal membranes  Eyes: Anicteric,  Neck: Supple, trachea midline  Lungs:  B/l basial crackles  Heart: No rub  Abdomen:  Soft, nontender,   Extremities: No peripheral edema.  Neurologic: Nonfocal, able to answer questions, depressed mood  Skin: +sacral decub  Access: RIJ permcath, bruising noted around permcath site    Basic Metabolic Panel:  Recent Labs Lab 07/06/17 1604 07/07/17 0333 07/10/17 1101  NA 140 142 134*  K 2.5* 3.5 3.7  CL 99* 103 101  CO2 27 26 26   GLUCOSE 86 85 109*  BUN 27* 33* 34*  CREATININE 3.02* 3.41* 3.97*  CALCIUM 8.1* 8.4* 8.2*  PHOS  --   --  <1.0*    Liver Function Tests:  Recent Labs Lab 07/06/17 1604 07/10/17 1101  AST 41  --   ALT 34  --   ALKPHOS 183*  --   BILITOT 1.4*  --   PROT 5.4*  --   ALBUMIN 2.5* 2.2*   No results for input(s): LIPASE, AMYLASE in the last 168 hours.  Recent Labs Lab 07/08/17 1400  AMMONIA 17    CBC:  Recent Labs Lab 07/06/17 1604  07/07/17 0333  07/07/17 1237 07/07/17 1922 07/08/17 0516 07/09/17 0423 07/10/17 1101  WBC 10.8  --  9.9  --   --  13.3*  --  12.6*  HGB 9.9*  < > 11.0* 11.0* 9.9* 9.9* 9.7* 9.8*  HCT 29.0*  --  33.2*  --   --  29.4*  --  29.3*  MCV 105.1*  --  106.8*  --   --  106.8*  --  107.4*  PLT 225  --  208  --   --  195  --  180  < > = values in this interval not displayed.  Cardiac Enzymes: No results for input(s): CKTOTAL, CKMB, CKMBINDEX, TROPONINI in the last 168 hours.  BNP: Invalid input(s): POCBNP  CBG:  Recent Labs Lab 07/10/17 1157 07/10/17 1656 07/10/17 2252 07/11/17 0752 07/11/17 1214  GLUCAP 100* 89 120* 83 75    Microbiology: Results for orders placed or performed during the hospital encounter of 07/06/17  MRSA PCR Screening     Status: None   Collection Time: 07/06/17  8:21 PM  Result Value Ref Range Status   MRSA by PCR NEGATIVE NEGATIVE Final    Comment:        The GeneXpert MRSA Assay (FDA approved for NASAL specimens only), is one component of a comprehensive MRSA colonization surveillance program. It is not intended to  diagnose MRSA infection nor to guide or monitor treatment for MRSA infections.   Gastrointestinal Panel by PCR , Stool     Status: None   Collection Time: 07/09/17  1:48 PM  Result Value Ref Range Status   Campylobacter species NOT DETECTED NOT DETECTED Final   Plesimonas shigelloides NOT DETECTED NOT DETECTED Final   Salmonella species NOT DETECTED NOT DETECTED Final   Yersinia enterocolitica NOT DETECTED NOT DETECTED Final   Vibrio species NOT DETECTED NOT DETECTED Final   Vibrio cholerae NOT DETECTED NOT DETECTED Final   Enteroaggregative E coli (EAEC) NOT DETECTED NOT DETECTED Final   Enteropathogenic E coli (EPEC) NOT DETECTED NOT DETECTED Final   Enterotoxigenic E coli (ETEC) NOT DETECTED NOT DETECTED Final   Shiga like toxin producing E coli (STEC) NOT DETECTED NOT DETECTED Final   Shigella/Enteroinvasive E coli (EIEC) NOT DETECTED NOT  DETECTED Final   Cryptosporidium NOT DETECTED NOT DETECTED Final   Cyclospora cayetanensis NOT DETECTED NOT DETECTED Final   Entamoeba histolytica NOT DETECTED NOT DETECTED Final   Giardia lamblia NOT DETECTED NOT DETECTED Final   Adenovirus F40/41 NOT DETECTED NOT DETECTED Final   Astrovirus NOT DETECTED NOT DETECTED Final   Norovirus GI/GII NOT DETECTED NOT DETECTED Final   Rotavirus A NOT DETECTED NOT DETECTED Final   Sapovirus (I, II, IV, and V) NOT DETECTED NOT DETECTED Final  C difficile quick scan w PCR reflex     Status: Abnormal   Collection Time: 07/09/17  1:48 PM  Result Value Ref Range Status   C Diff antigen POSITIVE (A) NEGATIVE Final   C Diff toxin NEGATIVE NEGATIVE Final   C Diff interpretation Results are indeterminate. See PCR results.  Final  Clostridium Difficile by PCR     Status: Abnormal   Collection Time: 07/09/17  1:48 PM  Result Value Ref Range Status   Toxigenic C Difficile by pcr POSITIVE (A) NEGATIVE Final    Comment: Positive for toxigenic C. difficile with little to no toxin production. Only treat if clinical presentation suggests symptomatic illness.    Coagulation Studies: No results for input(s): LABPROT, INR in the last 72 hours.  Urinalysis: No results for input(s): COLORURINE, LABSPEC, PHURINE, GLUCOSEU, HGBUR, BILIRUBINUR, KETONESUR, PROTEINUR, UROBILINOGEN, NITRITE, LEUKOCYTESUR in the last 72 hours.  Invalid input(s): APPERANCEUR    Imaging: No results found.   Medications:    . amiodarone  200 mg Oral Daily  . collagenase   Topical Daily  . levothyroxine  100 mcg Oral QAC breakfast  . megestrol  40 mg Oral BID  . metoprolol tartrate  12.5 mg Oral BID  . midodrine  10 mg Oral TID WC  . multivitamin  1 tablet Oral QHS  . pantoprazole (PROTONIX) IV  40 mg Intravenous Q12H  . protein supplement shake  11 oz Oral BID BM  . QUEtiapine  50 mg Oral QHS  . sertraline  25 mg Oral Daily  . vancomycin  125 mg Oral Q6H  . vitamin C  500  mg Oral BID  . zinc sulfate  220 mg Oral Daily   acetaminophen **OR** acetaminophen, ALPRAZolam, haloperidol lactate, ondansetron **OR** ondansetron (ZOFRAN) IV  Assessment/ Plan:  Ms. Neah Sporrer is a 66 y.o. white female with end stage renal disease on hemodialysis, hypertension, atrial fibrillation, hypothyroidism, congestive heart failure   TTS [facility]  1. End stage renal disease: with RIJ permcath.  TTS schedule Hemodialysis planned for tomorrow  2. Hypotension:  - midodrine with treatment as  necessary  3. Anemia of chronic kidney disease: with GI bleed.   EGD shows bleeding angioectasia is in the stomach EPO with HD  4. Secondary Hyperparathyroidism:  - velphoro as outpatient.  - hold for now.  May restart once patient is able to take normal oral intake - phosphorus was lowyesterday.  Given IV replacement. Try to draw labs on dialysis days   LOS: 5 Anuja Manka 10/3/20183:42 PM

## 2017-07-11 NOTE — Progress Notes (Signed)
Patient ID: [name], female   DOB: [birth year], 66 y.o.   MRN: [id number]  [facility] PROGRESS NOTE  Manon Banbury INO:[id number] DOB: 1951/08/21 DOA: 07/06/2017 PCP: Tracie Harrier, MD  HPI/Subjective: Patient not eating much. Keeping some food in her mouth. She answers yes or no questions appropriately. Husband at the bedside requesting psychiatric consultation. Patient refuses blood work today  Objective: Vitals:   07/10/17 2127 07/11/17 0621  BP: 123/61 137/81  Pulse: 77 90  Resp: 18 18  Temp: 98.4 F (36.9 C) 98.7 F (37.1 C)  SpO2: 100% 100%    Filed Weights   07/06/17 2000 07/07/17 1325  Weight: 59.4 kg (131 lb) 59.2 kg (130 lb 8.2 oz)    ROS: Review of Systems  Constitutional: Negative for chills and fever.  Eyes: Negative for blurred vision.  Respiratory: Negative for cough and shortness of breath.   Cardiovascular: Negative for chest pain.  Gastrointestinal: Positive for diarrhea. Negative for abdominal pain, constipation, nausea and vomiting.  Genitourinary: Negative for dysuria.  Musculoskeletal: Negative for joint pain.  Neurological: Positive for weakness. Negative for dizziness and headaches.   Exam: Physical Exam  HENT:  Nose: No mucosal edema.  Mouth/Throat: No oropharyngeal exudate or posterior oropharyngeal edema.  Eyes: Pupils are equal, round, and reactive to light. Conjunctivae, EOM and lids are normal.  Neck: No JVD present. Carotid bruit is not present. No edema present. No thyroid mass and no thyromegaly present.  Cardiovascular: S1 normal and S2 normal.  Exam reveals no gallop.   No murmur heard. Pulses:      Dorsalis pedis pulses are 2+ on the right side, and 2+ on the left side.  Respiratory: No respiratory distress. She has no wheezes. She has no rhonchi. She has no rales.  GI: Soft. Bowel sounds are normal. There is no tenderness.  Musculoskeletal:       Right ankle: She exhibits swelling.       Left ankle: She exhibits  swelling.  Lymphadenopathy:    She has no cervical adenopathy.  Neurological: She is alert. No cranial nerve deficit.  Skin: Skin is warm. Nails show no clubbing.  Patient had skin grafts of her left lower extremity that are covered. Patient has at least a stage III decubitus on the sacrum. Large amount of bruising throughout the entire body  Psychiatric: She has a normal mood and affect.      Data Reviewed: Basic Metabolic Panel:  Recent Labs Lab 07/06/17 1604 07/07/17 0333 07/10/17 1101  NA 140 142 134*  K 2.5* 3.5 3.7  CL 99* 103 101  CO2 27 26 26   GLUCOSE 86 85 109*  BUN 27* 33* 34*  CREATININE 3.02* 3.41* 3.97*  CALCIUM 8.1* 8.4* 8.2*  PHOS  --   --  <1.0*   Liver Function Tests:  Recent Labs Lab 07/06/17 1604 07/10/17 1101  AST 41  --   ALT 34  --   ALKPHOS 183*  --   BILITOT 1.4*  --   PROT 5.4*  --   ALBUMIN 2.5* 2.2*   CBC:  Recent Labs Lab 07/06/17 1604  07/07/17 0333 07/07/17 1237 07/07/17 1922 07/08/17 0516 07/09/17 0423 07/10/17 1101  WBC 10.8  --  9.9  --   --  13.3*  --  12.6*  HGB 9.9*  < > 11.0* 11.0* 9.9* 9.9* 9.7* 9.8*  HCT 29.0*  --  33.2*  --   --  29.4*  --  29.3*  MCV 105.1*  --  106.8*  --   --  106.8*  --  107.4*  PLT 225  --  208  --   --  195  --  180  < > = values in this interval not displayed. BNP (last 3 results)  Recent Labs  04/08/17 0139  BNP 2,012.0*     Recent Results (from the past 240 hour(s))  MRSA PCR Screening     Status: None   Collection Time: 07/06/17  8:21 PM  Result Value Ref Range Status   MRSA by PCR NEGATIVE NEGATIVE Final    Comment:        The GeneXpert MRSA Assay (FDA approved for NASAL specimens only), is one component of a comprehensive MRSA colonization surveillance program. It is not intended to diagnose MRSA infection nor to guide or monitor treatment for MRSA infections.   Gastrointestinal Panel by PCR , Stool     Status: None   Collection Time: 07/09/17  1:48 PM  Result  Value Ref Range Status   Campylobacter species NOT DETECTED NOT DETECTED Final   Plesimonas shigelloides NOT DETECTED NOT DETECTED Final   Salmonella species NOT DETECTED NOT DETECTED Final   Yersinia enterocolitica NOT DETECTED NOT DETECTED Final   Vibrio species NOT DETECTED NOT DETECTED Final   Vibrio cholerae NOT DETECTED NOT DETECTED Final   Enteroaggregative E coli (EAEC) NOT DETECTED NOT DETECTED Final   Enteropathogenic E coli (EPEC) NOT DETECTED NOT DETECTED Final   Enterotoxigenic E coli (ETEC) NOT DETECTED NOT DETECTED Final   Shiga like toxin producing E coli (STEC) NOT DETECTED NOT DETECTED Final   Shigella/Enteroinvasive E coli (EIEC) NOT DETECTED NOT DETECTED Final   Cryptosporidium NOT DETECTED NOT DETECTED Final   Cyclospora cayetanensis NOT DETECTED NOT DETECTED Final   Entamoeba histolytica NOT DETECTED NOT DETECTED Final   Giardia lamblia NOT DETECTED NOT DETECTED Final   Adenovirus F40/41 NOT DETECTED NOT DETECTED Final   Astrovirus NOT DETECTED NOT DETECTED Final   Norovirus GI/GII NOT DETECTED NOT DETECTED Final   Rotavirus A NOT DETECTED NOT DETECTED Final   Sapovirus (I, II, IV, and V) NOT DETECTED NOT DETECTED Final  C difficile quick scan w PCR reflex     Status: Abnormal   Collection Time: 07/09/17  1:48 PM  Result Value Ref Range Status   C Diff antigen POSITIVE (A) NEGATIVE Final   C Diff toxin NEGATIVE NEGATIVE Final   C Diff interpretation Results are indeterminate. See PCR results.  Final  Clostridium Difficile by PCR     Status: Abnormal   Collection Time: 07/09/17  1:48 PM  Result Value Ref Range Status   Toxigenic C Difficile by pcr POSITIVE (A) NEGATIVE Final    Comment: Positive for toxigenic C. difficile with little to no toxin production. Only treat if clinical presentation suggests symptomatic illness.     Scheduled Meds: . amiodarone  200 mg Oral Daily  . collagenase   Topical Daily  . [START ON 07/12/2017] epoetin (EPOGEN/PROCRIT)  injection  10,000 Units Intravenous Q T,Th,Sa-HD  . levothyroxine  100 mcg Oral QAC breakfast  . megestrol  40 mg Oral BID  . metoprolol tartrate  12.5 mg Oral BID  . midodrine  10 mg Oral TID WC  . multivitamin  1 tablet Oral QHS  . pantoprazole (PROTONIX) IV  40 mg Intravenous Q12H  . protein supplement shake  11 oz Oral BID BM  . QUEtiapine  50 mg Oral QHS  . sertraline  25 mg Oral Daily  .  vancomycin  125 mg Oral Q6H  . vitamin C  500 mg Oral BID  . zinc sulfate  220 mg Oral Daily    Assessment/Plan:  1. Clostridium difficile colitis. I started oral vancomycin. I was concerned that the patient still had diarrhea and sent off stool studies. 2. Melena with upper GI bleed. Angiectasia is cauterized when EGD was done. Advance diet. Hemoglobin stable. 3. Atrial fibrillation. Initially had hypotension and bradycardia. Restart low-dose metoprolol and continue amiodarone.  Hold Eliquis with GI bleed. Risk of stroke higher off anticoagulation.  4. Recent left upper extremity DVT. Ultrasound of the left upper extremity negative for DVT. 5. End-stage renal disease on hemodialysis. Patient had hemodialysis yesterday. 6. Hypothyroidism unspecified on Synthroid 7. Stage III decubitus ulcer present on admission.  Appreciate wound care evaluation 8. Physical therapy evaluation. 9. Hallucinations. Seems to have improved. On Seroquel at night. As needed IV Haldol.  Has been requested psychiatric consultation. 10. I advised the patient if she does not eat she will not survive.  Code Status:     Code Status Orders        Start     Ordered   07/06/17 1852  Full code  Continuous     07/06/17 1851    Code Status History    Date Active Date Inactive Code Status Order ID Comments User Context   06/06/2017  6:10 PM 06/06/2017  6:10 PM Full Code [id number]  Bary Leriche, PA-C Inpatient   06/06/2017  6:10 PM 06/19/2017  6:49 PM Full Code [id number]  Bary Leriche, PA-C Inpatient   06/03/2017 12:44 AM  06/06/2017  5:51 PM Full Code [id number]  Etta Quill, DO Inpatient   05/07/2017  6:46 PM 06/03/2017 12:44 AM Full Code [id number]  Bary Leriche, PA-C Inpatient   05/07/2017  6:46 PM 05/07/2017  6:46 PM Full Code [id number]  Bary Leriche, PA-C Inpatient   05/01/2017 11:47 AM 05/07/2017  6:18 PM Full Code [id number]  Lavonia Dana, MD Inpatient   04/19/2017 10:50 AM 05/01/2017 11:47 AM DNR [id number]  Flora Lipps, MD Inpatient   04/08/2017  6:50 AM 04/19/2017 10:50 AM Full Code [id number]  Harrie Foreman, MD Inpatient     Family Communication: husband At the bedside Disposition Plan: Rehabilitation soon  Consultants:  Gastroenterology  Nephrology  Husband requested psychiatry  Time spent: 25 minutes  [place], [facility]

## 2017-07-11 NOTE — Care Management Important Message (Signed)
Important Message  Patient Details  Name: [name] MRN: [id number] Date of Birth: [birth year]   Medicare Important Message Given:  Yes    Beverly Sessions, RN 07/11/2017, 4:53 PM

## 2017-07-12 ENCOUNTER — Other Ambulatory Visit: Payer: Medicare Other

## 2017-07-12 ENCOUNTER — Inpatient Hospital Stay: Payer: Medicare Other | Admitting: Hematology

## 2017-07-12 LAB — GLUCOSE, CAPILLARY
GLUCOSE-CAPILLARY: 109 mg/dL — AB (ref 65–99)
Glucose-Capillary: 102 mg/dL — ABNORMAL HIGH (ref 65–99)
Glucose-Capillary: 101 mg/dL — ABNORMAL HIGH (ref 65–99)
Glucose-Capillary: 103 mg/dL — ABNORMAL HIGH (ref 65–99)

## 2017-07-12 LAB — CBC
HEMATOCRIT: 27.3 % — AB (ref 35.0–47.0)
Hemoglobin: 9.1 g/dL — ABNORMAL LOW (ref 12.0–16.0)
MCH: 34.9 pg — ABNORMAL HIGH (ref 26.0–34.0)
MCHC: 33.3 g/dL (ref 32.0–36.0)
MCV: 104.9 fL — ABNORMAL HIGH (ref 80.0–100.0)
PLATELETS: 151 10*3/uL (ref 150–440)
RBC: 2.6 MIL/uL — ABNORMAL LOW (ref 3.80–5.20)
RDW: 20.4 % — AB (ref 11.5–14.5)
WBC: 14.8 10*3/uL — AB (ref 3.6–11.0)

## 2017-07-12 LAB — RENAL FUNCTION PANEL
ALBUMIN: 2 g/dL — AB (ref 3.5–5.0)
Anion gap: 11 (ref 5–15)
BUN: 34 mg/dL — AB (ref 6–20)
CALCIUM: 8.4 mg/dL — AB (ref 8.9–10.3)
CO2: 26 mmol/L (ref 22–32)
CREATININE: 3.17 mg/dL — AB (ref 0.44–1.00)
Chloride: 99 mmol/L — ABNORMAL LOW (ref 101–111)
GFR calc Af Amer: 17 mL/min — ABNORMAL LOW (ref >60)
GFR, EST NON AFRICAN AMERICAN: 14 mL/min — AB (ref >60)
GLUCOSE: 112 mg/dL — AB (ref 65–99)
PHOSPHORUS: 1.5 mg/dL — AB (ref 2.5–4.6)
Potassium: 3.9 mmol/L (ref 3.5–5.1)
SODIUM: 136 mmol/L (ref 135–145)

## 2017-07-12 LAB — PHOSPHORUS: PHOSPHORUS: 1.6 mg/dL — AB (ref 2.5–4.6)

## 2017-07-12 MED ORDER — ORAL CARE MOUTH RINSE
15.0000 mL | Freq: Two times a day (BID) | OROMUCOSAL | Status: DC
  Administered 2017-07-12: 15 mL via OROMUCOSAL

## 2017-07-12 MED ORDER — CHLORHEXIDINE GLUCONATE 0.12 % MT SOLN
15.0000 mL | Freq: Two times a day (BID) | OROMUCOSAL | Status: DC
  Administered 2017-07-12 – 2017-07-13 (×2): 15 mL via OROMUCOSAL
  Filled 2017-07-12 (×2): qty 15

## 2017-07-12 NOTE — Consult Note (Signed)
Consultation Note Date: 07/12/2017   Patient Name: [name]  DOB: Oct 31, 1950  MRN: [id number]  Age / Sex: 66 y.o., female  PCP: Tracie Harrier, MD Referring Physician: Bettey Costa, MD  Reason for Consultation: Establishing goals of care  HPI/Patient Profile: [name]  is a 66 y.o. female with a known history of atrial fibrillation on eliquis, end-stage renal disease on Tuesday, Thursday and Saturday hemodialysis, hypertension presents to the hospital from home secondary to hypotension and weakness. She currently has a diagnosis of c-diff.    Clinical Assessment and Goals of Care:   Met [name] today during her dialysis session, dialysis RN present at bedside. Worked today on [employer]. She worked 39 years at [employer] prior to retiring. She states she lives with her significant other who is not present at this time, and does not have children.   She states she has been taking dialysis for approximately 3 months and it is going much better than when she started. She states she has not been hungry, and not eating much. She states she had bleeding with her Eliquis which is noted in EMR.   She states she does have other family, but would like her significant other to be her decision maker. Pastoral Care order placed for directive listing him as POA.    She is currently alert and oriented and making decisions.     SUMMARY OF RECOMMENDATIONS   Will return tomorrow for [place] and code status conversation.    Code Status/Advance Care Planning:  Full code   Psycho-social/Spiritual:   Desire for further Chaplaincy support:yes, advance directive for POA.    Prognosis:   < 6 months Depending on further bleeding and oral intake  Discharge Planning: To Be Determined      Primary Diagnoses: Present on Admission: . Anemia   I have reviewed the medical record, interviewed the patient and  family, and examined the patient. The following aspects are pertinent.  Past Medical History:  Diagnosis Date  . A-fib ([place])    on eliquis  . Dysrhythmia   . ESRD (end stage renal disease) ([place])    on Tue-Thur-Sat dialysis  . Hypertension   . Hypothyroidism   . Thyroid disease    Social History   Social History  . Marital status: Married    Spouse name: N/A  . Number of children: N/A  . Years of education: N/A   Social History Main Topics  . Smoking status: Never Smoker  . Smokeless tobacco: Never Used  . Alcohol use No  . Drug use: No  . Sexual activity: Not Currently   Other Topics Concern  . None   Social History Narrative   Lives at home with husband, has a wheel chair   Family History  Problem Relation Age of Onset  . AAA (abdominal aortic aneurysm) Mother    Scheduled Meds: . amiodarone  200 mg Oral Daily  . collagenase   Topical Daily  . epoetin (EPOGEN/PROCRIT) injection  10,000 Units Intravenous Q T,Th,Sa-HD  .  levothyroxine  100 mcg Oral QAC breakfast  . megestrol  40 mg Oral BID  . metoprolol tartrate  12.5 mg Oral BID  . midodrine  10 mg Oral TID WC  . multivitamin  1 tablet Oral QHS  . pantoprazole (PROTONIX) IV  40 mg Intravenous Q12H  . protein supplement shake  11 oz Oral BID BM  . QUEtiapine  50 mg Oral QHS  . sertraline  25 mg Oral Daily  . vancomycin  125 mg Oral Q6H  . vitamin C  500 mg Oral BID  . zinc sulfate  220 mg Oral Daily   Continuous Infusions: PRN Meds:.acetaminophen **OR** acetaminophen, ALPRAZolam, haloperidol lactate, ondansetron **OR** ondansetron (ZOFRAN) IV Medications Prior to Admission:  Prior to Admission medications   Medication Sig Start Date End Date Taking? Authorizing Provider  amiodarone (PACERONE) 400 MG tablet Take 1 tablet (400 mg total) by mouth 2 (two) times daily. 06/19/17  Yes Love, Ivan Anchors, PA-C  apixaban (ELIQUIS) 5 MG TABS tablet Take 1 tablet (5 mg total) by mouth 2 (two) times daily. 06/19/17  Yes  Love, Ivan Anchors, PA-C  collagenase (SANTYL) ointment Apply topically daily. 06/20/17  Yes Love, Ivan Anchors, PA-C  famotidine (PEPCID) 20 MG tablet Take 1 tablet (20 mg total) by mouth at bedtime. 06/19/17  Yes Love, Ivan Anchors, PA-C  hydrocerin (EUCERIN) CREA Apply 1 application topically 2 (two) times daily. To dry areas on feet. 06/19/17  Yes Love, Ivan Anchors, PA-C  levothyroxine (SYNTHROID, LEVOTHROID) 100 MCG tablet Take 1 tablet (100 mcg total) by mouth daily before breakfast. 06/19/17  Yes Love, Ivan Anchors, PA-C  megestrol (MEGACE) 40 MG tablet Take 1 tablet (40 mg total) by mouth 2 (two) times daily. 06/19/17  Yes Love, Ivan Anchors, PA-C  metoprolol tartrate (LOPRESSOR) 50 MG tablet Take 1 tablet (50 mg total) by mouth 2 (two) times daily. 06/19/17  Yes Love, Ivan Anchors, PA-C  midodrine (PROAMATINE) 10 MG tablet Take 1 tablet (10 mg total) by mouth 3 (three) times daily with meals. 06/19/17  Yes Love, Ivan Anchors, PA-C  multivitamin (RENA-VIT) TABS tablet Take 1 tablet by mouth at bedtime. 06/19/17  Yes Love, Ivan Anchors, PA-C  QUEtiapine (SEROQUEL) 25 MG tablet Take 1 tablet (25 mg total) by mouth at bedtime. 06/19/17  Yes Love, Ivan Anchors, PA-C  senna-docusate (SENOKOT-S) 8.6-50 MG tablet Take 2 tablets by mouth 2 (two) times daily. 06/19/17  Yes Love, Ivan Anchors, PA-C  sertraline (ZOLOFT) 50 MG tablet Take 50 mg by mouth daily.   Yes [provider]  sucroferric oxyhydroxide (VELPHORO) 500 MG chewable tablet Chew 1 tablet (500 mg total) by mouth 3 (three) times daily with meals. 06/19/17  Yes Love, Ivan Anchors, PA-C  ALPRAZolam (XANAX) 0.25 MG tablet Take 50 mg by mouth at bedtime as needed for anxiety.     [provider]  misoprostol (CYTOTEC) 200 MCG tablet Take 3 pills by mouth the night before biopsy. Patient not taking: Reported on 07/06/2017 07/04/17   Emily Filbert, MD  nystatin (MYCOSTATIN) 100000 UNIT/ML suspension Take 5 mLs (500,000 Units total) by mouth 4 (four) times daily. Patient not taking:  Reported on 07/06/2017 06/19/17   Love, Ivan Anchors, PA-C  polyethylene glycol [facility] / GLYCOLAX) packet Take 17 g by mouth daily as needed. For constipation 06/19/17   Love, Ivan Anchors, PA-C   No Known Allergies Review of Systems  Constitutional: Positive for appetite change.    Physical Exam  Constitutional: She is oriented to  person, place, and time. No distress.  HENT:  Head: Normocephalic.  Eyes: EOM are normal.  Cardiovascular:  Warm and dry  Pulmonary/Chest: Effort normal.  Neurological: She is alert and oriented to person, place, and time.  Psychiatric: She has a normal mood and affect.    Vital Signs: BP 104/78   Pulse 87   Temp 98 F (36.7 C) (Oral)   Resp 16   Ht '5\' 5"'  (1.651 m)   Wt 59.2 kg (130 lb 8.2 oz)   SpO2 99%   BMI 21.72 kg/m  Pain Assessment: No/denies pain   Pain Score: 0-No pain   SpO2: SpO2: 99 % O2 Device:SpO2: 99 % O2 Flow Rate: .O2 Flow Rate (L/min): 2 L/min  IO: Intake/output summary:  Intake/Output Summary (Last 24 hours) at 07/12/17 1520 Last data filed at 07/12/17 0522  Gross per 24 hour  Intake               80 ml  Output                0 ml  Net               80 ml    LBM: Last BM Date: 07/11/17 Baseline Weight: Weight: 59.4 kg (131 lb) Most recent weight: Weight: 59.2 kg (130 lb 8.2 oz)     Palliative Assessment/Data: 40%     Time In: 3:10 Time Out: 3:45 Time Total: 35 minutes Greater than 50%  of this time was spent counseling and coordinating care related to the above assessment and plan.  Signed by: Asencion Gowda, NP 07/12/2017 3:40 PM Office: [phone] 7am-7pm  Call primary team after hours  Please contact Palliative Medicine Team phone at [phone] for questions and concerns.  For individual provider: See Shea Evans

## 2017-07-12 NOTE — Progress Notes (Signed)
Family Meeting Note  Advance Directive:no  Today a meeting took place with the Patient.and husband  The following clinical team members were present during this meeting:MD  The following were discussed:Patient's diagnosis:c dif colitis esrd on hd  atrial fibrillation Poor nutrition,, adult FTT  Patient's progosis: < 12 months  If she does not eatand Goals for treatment: Palliative Care  Additional follow-up to be provided: PC consult   Time spent during discussion:20 minutes  Shannon Hege, MD

## 2017-07-12 NOTE — Evaluation (Signed)
Clinical/Bedside Swallow Evaluation Patient Details  Name: [name] MRN: [id number] Date of Birth: 08-11-1951  Today's Date: 07/12/2017 Time: SLP Start Time (ACUTE ONLY): 1250 SLP Stop Time (ACUTE ONLY): 1350 SLP Time Calculation (min) (ACUTE ONLY): 60 min  Past Medical History:  Past Medical History:  Diagnosis Date  . A-fib ([place])    on eliquis  . Dysrhythmia   . ESRD (end stage renal disease) ([place])    on Tue-Thur-Sat dialysis  . Hypertension   . Hypothyroidism   . Thyroid disease    Past Surgical History:  Past Surgical History:  Procedure Laterality Date  . APPLICATION OF A-CELL OF EXTREMITY Left 05/28/2017   Procedure: APPLICATION OF A-CELL OF LEFT LOWER EXTREMITY;  Surgeon: Wallace Going, DO;  Location: [place];  Service: Plastics;  Laterality: Left;  . APPLICATION OF WOUND VAC Left 05/28/2017   Procedure: APPLICATION OF WOUND VAC;  Surgeon: Wallace Going, DO;  Location: [place];  Service: Plastics;  Laterality: Left;  . DIALYSIS/PERMA CATHETER INSERTION N/A 04/26/2017   Procedure: Dialysis/Perma Catheter Insertion;  Surgeon: Algernon Huxley, MD;  Location: [place] [facility];  Service: Cardiovascular;  Laterality: N/A;  . ESOPHAGOGASTRODUODENOSCOPY (EGD) WITH PROPOFOL N/A 07/09/2017   Procedure: ESOPHAGOGASTRODUODENOSCOPY (EGD) WITH PROPOFOL;  Surgeon: Lucilla Lame, MD;  Location: [facility];  Service: Endoscopy;  Laterality: N/A;  . INCISION AND DRAINAGE OF WOUND Left 05/28/2017   Procedure: IRRIGATION AND DEBRIDEMENT LEFT LOWER LEG WOUND;  Surgeon: Wallace Going, DO;  Location: [place];  Service: Plastics;  Laterality: Left;  . PR DEBRIDEMENT, SKIN, SUB-Q TISSUE,=<20 SQ CM  05/21/2017      . PR DEBRIDEMENT, SKIN, SUB-Q TISSUE,EACH ADD 20 SQ CM  05/21/2017      . WRIST ARTHROSCOPY     HPI:  Pt is an 66 y.o. female with history of ESRD, hypothyroidism, HTN, A fib-on pradaxa who was admitted on [facility] on 04/08/17 due to BLE cellulitis with difficulty walking. She  was found to be septic with leucocytosis, hypotensive with two week history of DOE and BLE edema with cellulitis.  She was started on broad spectrum antibiotics and was noted to be volume overloaded with right pleural effusion requiring CRRT. Hospital course significant for PEA arrest 7/4, ARDS, anuria requiring  IJ placement by vascular surgery as now HD dependent, shocked liver with abnormal LFTs, thrombocytopenia, ongoing issues with hypotension and tachycardia.  She developed BUE edema due to "occlusive deep vein thrombosis in the left cephalic vein extending from the mid humeral region down towards the elbow".   She has had issues with A fib with RVR and placed on amiodarone and Cardizem for HR control and Dr. [name] recommended coumadin or low dose Eliquis due to A fib/stroke prophylaxis.   Serologic work up negative and she was HD dependent.  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 She was admitted to [place] on 05/08/17 for intensive rehab program. She was noted to have BLE ulcers that have been monitored. HD has been ongoing with minimal renal recovery. She did develop vaginal bleeding and vaginal/pelvic ultrasound revealed "markedly abnormal and thickened endometrium containing a focal 3.7 cm hyperechoic mass". Gynecology was consulted and recommended outpatient follow up with biopsy. Left leg wound was debrided at bedside with minimal results therefore Dr. [name] was consulted for assistance. She was transitioned from Eliquis to coumadin and taken to OR for debridement with placement  of Acell and VAC on 05/28/17.  She developed A fib with flutter and was transferred to acute for treatment on 8/26. Amiodarone and metoprolol doses were increased for rate with HR currently in 90- 110 range.  Dr. [name] was consulted for placement of AVG but INR remains therapeutic and plans for graft in 6 weeks on outpatient basis.   She was cleared to resume activity and admitted back to resume CIR program. She is still in a wheelchair and physical therapy is working with her at home. She has h/o orthostatic hypotension and is on midodrine as an outpatient. She was noted to have low blood pressure yesterday after dialysis. She c/o dizziness, fatigue and had dark stools; sacral wound. Adult FTT per MD.    Assessment / Plan / Recommendation Clinical Impression  Pt appears to present w/ adequate oropharyngeal phase swallow function w/ bolus consistencies accepted by pt when offered; reduced risk for aspiration of po's from an oropharyngeal phase standpoint. Pt only accepted a few po trials but consumed all trials w/ no overt s/s of aspiration noted; clear vocal quality and no decline in respiratory status during/post trials. Oral phase appeared [facility] for bolus management and clearing w/ liquid trial consistency given via Straw (husband fed her as pt did not attempt to hold the drink herself). However, when given a Mech Soft consistency food (x1 trial) she would usually eat(per husband), pt exhibited min+ oral holding which appeared related to her not wanting the bolus trial and unsure if she wanted to continue to chew it to swallow it vs spit it out. With 2 sips of liquid, she finished masticating it in order to transfer it posteriorly for swallowing; she cleared orally and stated "no more". Pt's explanation was she "just didn't want it". This has been a similar sentiment when offered other po's and when eating meals. Pt does not appear to have any oral motor weakness that would impact her oral phase of swallowing. Thorough education given on general aspiration precautions. Educated pt on the need to monitor food consistencies especially meats and breads and moistening/breaking down thoroughly for easier mastication and conservation of energy. Recommend a Mech Soft diet w/ thin liquids; rest breaks as needed, slow Down, small bites/sips, oral  clearing w/ each bolus. Also discussed w/ pt and Husband that pt's reduced oral intake could be related to her Cognitive/Mental mindset and that she needed to create a behavior plan as to how and when she would try to eat/graze at foods/liquids. Encouraged them to f/u w/ a Dietician to gain information on frequent mini meals/snacks throughout the day to meet her needs nutritionally. Encouraged her(and husband) to pair oral intake w/ other fun things like visitors and favorite TV shows; also focus on a few bites at a time, not whole meals and use condiments/flavors. Pt's extended illness and C-Diff may be impacting her desire for oral intake.  SLP Visit Diagnosis: Dysphagia, unspecified (R13.10)    Aspiration Risk   (reduced )    Diet Recommendation  Mech Soft diet consistency for easier mastication and energy conservation; Thin liquids. General aspiration precautions.   Medication Administration: Whole meds with puree (crush if needed )    Other  Recommendations Recommended Consults: Consider GI evaluation (Dietician; palliative care; OT for self-feeding; Psychiatry) Oral Care Recommendations: Oral care BID;Staff/trained caregiver to provide oral care;Patient independent with oral care Other Recommendations:  (n/a)   Follow up Recommendations None      Frequency and Duration  (n/a)   (n/a)  Prognosis Prognosis for Safe Diet Advancement: Good Barriers to Reach Goals: Behavior;Severity of deficits (adult FTT; C-Diff)      Swallow Study   General Date of Onset: 07/06/17 HPI: Pt is an 66 y.o. female with history of ESRD, hypothyroidism, HTN, A fib-on pradaxa who was admitted on [facility] on 04/08/17 due to BLE cellulitis with difficulty walking. She was found to be septic with leucocytosis, hypotensive with two week history of DOE and BLE edema with cellulitis.  She was started on broad spectrum antibiotics and was noted to be volume overloaded with right pleural effusion requiring CRRT.  Hospital course significant for PEA arrest 7/4, ARDS, anuria requiring  IJ placement by vascular surgery as now HD dependent, shocked liver with abnormal LFTs, thrombocytopenia, ongoing issues with hypotension and tachycardia.  She developed BUE edema due to "occlusive deep vein thrombosis in the left cephalic vein extending from the mid humeral region down towards the elbow".   She has had issues with A fib with RVR and placed on amiodarone and Cardizem for HR control and Dr. [name] recommended coumadin or low dose Eliquis due to A fib/stroke prophylaxis.   Serologic work up negative and she was HD dependent.  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 She was admitted to [place] on 05/08/17 for intensive rehab program. She was noted to have BLE ulcers that have been monitored. HD has been ongoing with minimal renal recovery. She did develop vaginal bleeding and vaginal/pelvic ultrasound revealed "markedly abnormal and thickened endometrium containing a focal 3.7 cm hyperechoic mass". Gynecology was consulted and recommended outpatient follow up with biopsy. Left leg wound was debrided at bedside with minimal results therefore Dr. [name] was consulted for assistance. She was transitioned from Eliquis to coumadin and taken to OR for debridement with placement of Acell and VAC on 05/28/17.  She developed A fib with flutter and was transferred to acute for treatment on 8/26. Amiodarone and metoprolol doses were increased for rate with HR currently in 90- 110 range.  Dr. [name] was consulted for placement of AVG but INR remains therapeutic and plans for graft in 6 weeks on outpatient basis.  She was cleared to resume activity and admitted back to resume CIR program. She is still in a wheelchair and physical therapy is working with her at home. She has h/o orthostatic hypotension and is on midodrine as an outpatient. She was  noted to have low blood pressure yesterday after dialysis. She c/o dizziness, fatigue and had dark stools; sacral wound. Adult FTT per MD.  Type of Study: Bedside Swallow Evaluation Previous Swallow Assessment: none Diet Prior to this Study: Regular;Thin liquids (not eating much but swallows pills w/ NSG; drinks) Temperature Spikes Noted: No (wbc elevated - does have wounds per WOC) Respiratory Status: Room air History of Recent Intubation: No Behavior/Cognition: Alert;Cooperative;Pleasant mood Oral Cavity Assessment: Dry Oral Care Completed by SLP: Recent completion by staff Oral Cavity - Dentition: Adequate natural dentition Vision: Functional for self-feeding (but is being fed by husband often) Self-Feeding Abilities: Needs assist;Needs set up;Total assist Patient Positioning: Upright in bed (had to be repositioned) Baseline Vocal Quality: Normal;Low vocal intensity Volitional Cough: Strong Volitional Swallow: Able to elicit    Oral/Motor/Sensory Function Overall Oral Motor/Sensory Function: Within functional limits   Ice Chips Ice chips: Not tested   Thin Liquid Thin Liquid: Within functional limits Presentation: Straw (fed by husband; 6 trials)  Nectar Thick Nectar Thick Liquid: Not tested   Honey Thick Honey Thick Liquid: Not tested   Puree Puree: Not tested Other Comments: declined   Solid   GO   Solid: Impaired (mech soft) Presentation:  (fed; 1 trial accepted) Oral Phase Impairments:  (did not want it - orally held it brief but then cleared it) Pharyngeal Phase Impairments:  (none) Other Comments: pt's oral holding appeared related to her not wanting the bolus trial and unsure if she wanted to continue to chew it to swallow it vs spit it out. With 2 sips of liquid, she finished chewing it in order to transfer it posteriorly for swallowing. Pt's explanation was she "just didn't want it". This has been a similar sentiment when offered other po's and eating meals. Pt does  not appear to have any oral motor weakness that would impact her oral phase of swallowing.     Functional Assessment Tool Used: clinical judgement Functional Limitations: Swallowing Swallow Current Status (Q2449): At least 1 percent but less than 20 percent impaired, limited or restricted Swallow Goal Status [phone]): At least 1 percent but less than 20 percent impaired, limited or restricted Swallow Discharge Status [phone]): At least 1 percent but less than 20 percent impaired, limited or restricted     Orinda Kenner, MS, CCC-SLP Watson,Katherine 07/12/2017,2:13 PM

## 2017-07-12 NOTE — Consult Note (Signed)
[place] Nurse wound follow up Continue wound care to left lower extremity as ordered. 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 100% slough to wound bed and periwound from 7 to 12 o'clock Wound type:Chronic nonhealing wound, full thickness Pressure Injury POA: NA Sacrum:  Wound is 2 cm x 2 cm x 2 cm with slough to wound bed circumferential tissue loss from 7 to 12 o'clock extends 1.3 cm and is 100% adherent gray slough. Ongoing Santyl daily.  Has frequent loose stools (C-diff).  Wound has foul odor. Wound care is performed to sacrum at this time.  She is going to dialysis today.  She likes to lay on her right side (spouse at bedside).  Explained to patient and spouse that we must reposition every two hours to avoid breakdown on the right side. They verbalize understanding and allow positioning on left side, facing window with pillow behind her back.  She is on a mattress replacement with low air loss feature and low bed for safety due to confusion.     Drainage (amount, consistency, odor) Moderate creamy, thick drainage with musty odor.  Periwound:intact Dressing procedure/placement/frequency:Cleanse sacral wound and periwound with NS.  Apply Santyl to all areas of slough.  Fill wound defect with Ns moist 2x2 gauze.  Cover with silicone border foam dressing. Change daily.  Turn and reposition every two hours.  NO DISPOSABLE briefs or underpads.  [place] team will follow as needed.  Domenic Moras RN BSN [place] Pager [phone]

## 2017-07-12 NOTE — Progress Notes (Signed)
Physical Therapy Treatment Patient Details Name: [name] MRN: [id number] DOB: 03/15/1951 Today's Date: 07/12/2017    History of Present Illness Patient is a 66 y.o. female admitted on 68 SEP with anemia and rectal bleeding. PMH includes atrial fibrillation, ESRD with hemodialysis Tuesday, Thursday, and Saturday, and hypertension.  More recently, patient has been at home with husband in w/c, receiving HHPT.    PT Comments    Ms. Sudbeck presents with 10/10 buttocks pain and diffuse BUE and BLE weakness, thus deferred bed mobility this session.  Pt participated in therapeutic exercises in supine, requiring cues throughout due to poor attention.  SNF remains appropriate d/c plan.     Follow Up Recommendations  SNF     Equipment Recommendations  Rolling walker with 5" wheels    Recommendations for Other Services       Precautions / Restrictions Precautions Precautions: Fall Precaution Comments: R IJ perm cath; sacral ulcer; (LUE negative for DVT) Restrictions Weight Bearing Restrictions: No    Mobility  Bed Mobility               General bed mobility comments: Did not attempt due to 10/10 buttocks pain and diffuse BUE and BLE weakness  Transfers                    Ambulation/Gait                 Stairs            Wheelchair Mobility    Modified Rankin (Stroke Patients Only)       Balance Overall balance assessment:  (Unable to assess )                                          Cognition Arousal/Alertness: Awake/alert Behavior During Therapy: Flat affect Overall Cognitive Status: Impaired/Different from baseline Area of Impairment: Attention;Orientation;Memory                 Orientation Level: Disoriented to;Time Current Attention Level: Selective Memory: Decreased short-term memory                Exercises General Exercises - Upper Extremity Shoulder Flexion: AROM;Both;10 reps;Supine Elbow  Flexion: AROM;Both;10 reps;Supine Elbow Extension: AROM;Both;10 reps;Supine General Exercises - Lower Extremity Heel Slides: AAROM;Both;10 reps;Supine Hip ABduction/ADduction: AAROM;Both;10 reps;Supine Straight Leg Raises: AAROM;Both;10 reps;Supine    General Comments General comments (skin integrity, edema, etc.): Husband present in room during session      Pertinent Vitals/Pain Pain Assessment: 0-10 Pain Score: 10-Worst pain ever Pain Location: Buttocks Pain Descriptors / Indicators: Sore Pain Intervention(s): Limited activity within patient's tolerance;Monitored during session (pt had been repositioned 1 hr prior to session)    Home Living                      Prior Function            PT Goals (current goals can now be found in the care plan section) Acute Rehab PT Goals Patient Stated Goal: pt agreeable to work with therapy PT Goal Formulation: With patient Time For Goal Achievement: 07/22/17 Potential to Achieve Goals: Fair Progress towards PT goals: Not progressing toward goals - comment (due to fatigue, lethargy)    Frequency    Min 2X/week      PT Plan Current plan remains appropriate    Co-evaluation  AM-PAC PT "6 Clicks" Daily Activity  Outcome Measure  Difficulty turning over in bed (including adjusting bedclothes, sheets and blankets)?: Unable Difficulty moving from lying on back to sitting on the side of the bed? : Unable Difficulty sitting down on and standing up from a chair with arms (e.g., wheelchair, bedside commode, etc,.)?: Unable Help needed moving to and from a bed to chair (including a wheelchair)?: Total Help needed walking in hospital room?: Total Help needed climbing 3-5 steps with a railing? : Total 6 Click Score: 6    End of Session   Activity Tolerance: Patient limited by fatigue;Patient limited by lethargy;Patient tolerated treatment well Patient left: in bed;with call bell/phone within reach;with bed  alarm set;with family/visitor present Nurse Communication: Mobility status PT Visit Diagnosis: Muscle weakness (generalized) (M62.81);Difficulty in walking, not elsewhere classified (R26.2);Pain;Adult, failure to thrive (R62.7) Pain - Right/Left:  (buttocks) Pain - part of body:  (buttocks)     Time: 0165-5374 PT Time Calculation (min) (ACUTE ONLY): 17 min  Charges:  $Therapeutic Exercise: 8-22 mins                    G Codes:       Collie Siad PT, DPT 07/12/2017, 12:12 PM

## 2017-07-12 NOTE — Progress Notes (Addendum)
[place] [facility] [place] NAME: [name]    MR#:  [id number]  DATE OF BIRTH:  [birth year]  SUBJECTIVE:   Patient reports she does not want surgical feeding tube or NG tube placed. She is trying to eat but she does not have an appetite.  REVIEW OF SYSTEMS:    Review of Systems  Constitutional: Negative for fever, chills weight loss Positive generalized weakness and poor intake HENT: Negative for ear pain, nosebleeds, congestion, facial swelling, rhinorrhea, neck pain, neck stiffness and ear discharge.   Respiratory: Negative for cough, shortness of breath, wheezing  Cardiovascular: Negative for chest pain, palpitations and leg swelling.  Gastrointestinal: Negative for heartburn, abdominal pain, vomiting,positive for diarrhea Genitourinary: Negative for dysuria, urgency, frequency, hematuria Musculoskeletal: Negative for back pain or joint pain Neurological: Negative for dizziness, seizures, syncope, focal weakness,  numbness and headaches.  Hematological: Does not bruise/bleed easily.  Psychiatric/Behavioral: Negative for hallucinations, confusion, dysphoric mood    Tolerating Diet: not eating much      DRUG ALLERGIES:  No Known Allergies  VITALS:  Blood pressure 122/63, pulse 74, temperature 98.1 F (36.7 C), temperature source Oral, resp. rate 16, height 5\' 5"  (1.651 m), weight 59.2 kg (130 lb 8.2 oz), SpO2 100 %.  PHYSICAL EXAMINATION:  Constitutional: Appears well-developed and well-nourished. No distress. HENT: Normocephalic. Marland Kitchen Oropharynx is clear and moist.  Eyes: Conjunctivae and EOM are normal. PERRLA, no scleral icterus.  Neck: Normal ROM. Neck supple. No JVD. No tracheal deviation. CVS: RRR, S1/S2 +, no murmurs, no gallops, no carotid bruit.  Pulmonary: Effort and breath sounds normal, no stridor, rhonchi, wheezes, rales.  Abdominal: Soft. BS +,  no distension, tenderness, rebound or guarding.  Musculoskeletal: Normal  range of motion. No edema and no tenderness.  Neuro: Alert. CN 2-12 grossly intact. No focal deficits. Skin: Skin is warm and dry. No rash noted. Skin grafts of LE covered stage 3 decub sacrum Psychiatric: FLAT affect     LABORATORY PANEL:   CBC  Recent Labs Lab 07/10/17 1101  WBC 12.6*  HGB 9.8*  HCT 29.3*  PLT 180   ------------------------------------------------------------------------------------------------------------------  Chemistries   Recent Labs Lab 07/06/17 1604  07/10/17 1101  NA 140  < > 134*  K 2.5*  < > 3.7  CL 99*  < > 101  CO2 27  < > 26  GLUCOSE 86  < > 109*  BUN 27*  < > 34*  CREATININE 3.02*  < > 3.97*  CALCIUM 8.1*  < > 8.2*  AST 41  --   --   ALT 34  --   --   ALKPHOS 183*  --   --   BILITOT 1.4*  --   --   < > = values in this interval not displayed. ------------------------------------------------------------------------------------------------------------------  Cardiac Enzymes No results for input(s): TROPONINI in the last 168 hours. ------------------------------------------------------------------------------------------------------------------  RADIOLOGY:  No results found.   ASSESSMENT AND PLAN:   66 year old female with a history of PAF on an anticoagulation, end-stage renal disease on hemodialysis and essential hypertension who presented on September 28 due to concerns of generalized weakness and low blood pressure.  1. Orthostatic hypotension:this has improved on midodrine  2. Atrial fibrillation: Heart rate is controlled on amiodarone and metoprolol No anticoagulation due to GI bleed 3. C. Difficile colitis: Continue vancomycin for total of 14 days, started October 2  4.sacral decubiti stage III present on admission:  Cleanse sacral wound and periwound with NS.  Apply Santyl to all areas of slough. Fill wound defect with Ns moist 2x2 gauze. Cover with silicone border foam dressing. Change daily. Turn and  reposition every two hours. NO DISPOSABLE briefs or underpads  5. End-stage renal disease on dialysis: Continue dialysis as per her regular regimen.  6. Recent left upper extremity DVT: More recent ultrasound shows no DVT 7. Melena with upper GI bleed: EGD shows angiectasia is which were cauterized For now all anticoagulation has been discontinued  8. Hallucinations, visual: This is improved with Seroquel Psychiatry consultation pending  9. Nutrition, adult FTT: I had a long discussion with the family and the patient regarding her nutritional status. She is not eating and taking in the nutrients and calories that is needed for full recovery. Patient is well aware this. I will have speech consultation regarding her swallowing ability. She does not want NG tube or PEG placement.  Palliative care consultation is requested. Management plans discussed with the patient and husband and are in agreement.  CODE STATUS: full  TOTAL TIME TAKING CARE OF THIS PATIENT: 30 minutes.   Physical therapy is recommended skilled nursing faccharge  POSSIBLE D/C 1-3 days, DEPENDING ON CLINICAL CONDITION.   [name] M.D on 07/12/2017 at 11:16 AM  Between 7am to 6pm - Pager - [phone] After 6pm go to [email or web address] - password [email or web address] [facility]  Office  [phone]  CC: Primary care physician; Shannon Harrier, MD  Note: This dictation was prepared with Dragon dictation along with smaller phrase technology. Any transcriptional errors that result from this process are unintentional.

## 2017-07-13 LAB — BASIC METABOLIC PANEL
ANION GAP: 9 (ref 5–15)
BUN: 15 mg/dL (ref 6–20)
CALCIUM: 8.3 mg/dL — AB (ref 8.9–10.3)
CO2: 28 mmol/L (ref 22–32)
Chloride: 101 mmol/L (ref 101–111)
Creatinine, Ser: 1.87 mg/dL — ABNORMAL HIGH (ref 0.44–1.00)
GFR, EST AFRICAN AMERICAN: 31 mL/min — AB (ref >60)
GFR, EST NON AFRICAN AMERICAN: 27 mL/min — AB (ref >60)
Glucose, Bld: 106 mg/dL — ABNORMAL HIGH (ref 65–99)
POTASSIUM: 3.8 mmol/L (ref 3.5–5.1)
Sodium: 138 mmol/L (ref 135–145)

## 2017-07-13 LAB — GLUCOSE, CAPILLARY
GLUCOSE-CAPILLARY: 105 mg/dL — AB (ref 65–99)
GLUCOSE-CAPILLARY: 111 mg/dL — AB (ref 65–99)

## 2017-07-13 MED ORDER — PREMIER PROTEIN SHAKE: 11.0000 [oz_av] | Freq: Two times a day (BID) | ORAL | 1 refills | Status: AC

## 2017-07-13 MED ORDER — QUETIAPINE FUMARATE 50 MG PO TABS: 50.0000 mg | ORAL_TABLET | Freq: Every day | ORAL | 0 refills | Status: AC

## 2017-07-13 MED ORDER — ASCORBIC ACID 500 MG PO TABS: 500.0000 mg | ORAL_TABLET | Freq: Two times a day (BID) | ORAL | 0 refills | Status: AC

## 2017-07-13 MED ORDER — ZINC SULFATE 220 (50 ZN) MG PO CAPS: 220.0000 mg | ORAL_CAPSULE | ORAL | Status: DC

## 2017-07-13 MED ORDER — VANCOMYCIN 50 MG/ML ORAL SOLUTION: 125.0000 mg | Freq: Four times a day (QID) | ORAL | 0 refills | Status: AC

## 2017-07-13 MED ORDER — ZINC SULFATE 220 (50 ZN) MG PO CAPS: 220.0000 mg | ORAL_CAPSULE | ORAL | 0 refills | Status: AC

## 2017-07-13 MED ORDER — METOPROLOL TARTRATE 25 MG PO TABS: 12.5000 mg | ORAL_TABLET | Freq: Two times a day (BID) | ORAL | 0 refills | Status: AC

## 2017-07-13 MED ORDER — ALPRAZOLAM 0.25 MG PO TABS: 0.2500 mg | ORAL_TABLET | Freq: Every evening | ORAL | 0 refills | Status: AC | PRN

## 2017-07-13 MED ORDER — ADULT MULTIVITAMIN W/MINERALS CH: 1.0000 | ORAL_TABLET | ORAL | Status: DC

## 2017-07-13 NOTE — Discharge Planning (Signed)
Patient IV removed.  RN assessment and VS revealed stability for DC to facility.  Discharge papers printed, explained and educated.  Informed of suggested FU appt and appts made.  Needed scripts signed, printed in in packet.  Report called to [facility] s/w Vernetta Honey, RN. Patient dialysis time and place also placed in packet, per facility request.  EMS contacted to transport to room 407.  Husband will be informed when EMS arrives.  Awaiting arrival..

## 2017-07-13 NOTE — Progress Notes (Signed)
Nutrition Follow Up Note   DOCUMENTATION CODES:   Severe malnutrition in context of chronic illness  INTERVENTION:   Pt declines PEG tube placement  Liberalize diet and change to dysphasia 3 per SLP recs  Add MVI twice weekly   Selenium, ascorbic acid, and vitamin K labs pending  Monitor and supplement Phosphorus as needed; pt at high refeeding risk  Premier Protein BID, each supplement provides 160 kcal and 30 grams of protein.   Renal MVI daily   Vitamin C 500mg  BID  Zinc 220mg - change to 2 times weekly to prevent copper deficiency     NUTRITION DIAGNOSIS:   Malnutrition (severe) related to chronic illness (ESRD on HD, CHF) as evidenced by moderate depletions of muscle mass, 18 percent weight loss in <3 weeks, energy intake < or equal to 75% for > or equal to 1 month.  GOAL:   Patient will meet greater than or equal to 90% of their needs- not meeting   MONITOR:   PO intake, Supplement acceptance, Labs, Weight trends, Skin  ASSESSMENT:    66 y.o. white female with end stage renal disease on hemodialysis, hypertension, atrial fibrillation, hypothyroidism, congestive heart failure admitted for melena and generalized bruising   RIJ permcath  Pt continues to eat poorly and is severely malnourished. RD recommending PEG tube placement; pt does not want any feeding tubes. Pt's zinc lab returned low normal. Ascorbic acid, selenium, and vitamin K labs pending. Pt has continued to loose weight since admit. Pt with 12lb weight loss since admit; unsure how much is related to fluid versus true weight loss. Pt also with generalized edema and on HD. RD will add MVI twice weekly r/t pt's poor oral intake. Palliative care consulted and is now following this pt. Pt with continued low phosphorus; recommend monitor ans supplement as needed per MD discretion. Pt is at high refeeding risk. Pt s/p SLP evaluation 10/4; recommend dysphagia 3/thins for energy conservation. RD will liberalize renal  diet as pt is not eating hardly anything.   Medications reviewed and include: epoetin, synthroid, MVI, protonix, megace, vancomycin, Vit C, zinc, zofran    Creat 1.87(H), Ca 8.3(L) adj. 9.9 wnl, alb 2.0(L), P 1.5(L) WBC- 14.8(H), Hgb 9.1(L), Hct 27.3(L)  Diet Order:  DIET DYS 3 Room service appropriate? Yes; Fluid consistency: Thin; Fluid restriction: 1200 mL Fluid  Skin:   Unstageable pressure injury sacrum (2 cm x 2 cm x 2 cm ), incision leg s/p skin graft  Last BM:  10/3- Type 7  Height:   Ht Readings from Last 1 Encounters:  07/06/17 5\' 5"  (1.651 m)    Weight:   Wt Readings from Last 1 Encounters:  07/12/17 118 lb (53.5 kg)    Ideal Body Weight:  56.8 kg  BMI:  Body mass index is 19.64 kg/m.  Estimated Nutritional Needs:   Kcal:  1800-2000kcal/day   Protein:  95-106g/day   Fluid:  1230ml per MD  EDUCATION NEEDS:   Education needs addressed  Koleen Distance MS, RD, LDN Pager #[phone] After Hours Pager: [phone]

## 2017-07-13 NOTE — Discharge Summary (Signed)
[facility] [place] NAME: [name]    MR#:  [id number]  DATE OF BIRTH:  July 05, 1951  DATE OF ADMISSION:  07/06/2017 ADMITTING PHYSICIAN: Gladstone Lighter, MD  DATE OF DISCHARGE: 07/13/2017  PRIMARY CARE PHYSICIAN: Tracie Harrier, MD    ADMISSION DIAGNOSIS:  Rectal bleeding [K62.5]  DISCHARGE DIAGNOSIS:  Active Problems:   Anemia   Melena   Acute posthemorrhagic anemia   Angiodysplasia of stomach and duodenum with hemorrhage   Protein-calorie malnutrition, severe   SECONDARY DIAGNOSIS:   Past Medical History:  Diagnosis Date  . A-fib ([place])    on eliquis  . Dysrhythmia   . ESRD (end stage renal disease) ([place])    on Tue-Thur-Sat dialysis  . Hypertension   . Hypothyroidism   . Thyroid disease     HOSPITAL COURSE:  66 year old female with a history of PAF on an anticoagulation, end-stage renal disease on hemodialysis and essential hypertension who presented on September 28 due to concerns of generalized weakness and low blood pressure.  1. Orthostatic hypotension:This has improved on midodrine  2. Atrial fibrillation: Heart rate is controlled on amiodarone and metoprolol No anticoagulation due to GI bleed  3. C. Difficile colitis: Continue vancomycin for total of 14 days, started October 2  4.sacral decubiti stage III present on admission:  Cleanse sacral wound and periwound with NS. Apply Santyl to all areas of slough. Fill wound defect with Ns moist 2x2 gauze. Cover with silicone border foam dressing. Change daily. Turn and reposition every two hours. NO DISPOSABLE briefs or underpads  5. End-stage renal disease on dialysis: Continue dialysis as per her regular regimen.  6. Recent left upper extremity DVT: More recent ultrasound shows no DVT 7. Melena with upper GI bleed: EGD shows angiectasia is which were cauterized For now all anticoagulation has been discontinued  8. Hallucinations, visual: This is  improved with Seroquel  9. Nutrition, adult FTT: I had a long discussion with the family and the patient regarding her nutritional status. She is not eating and taking in the nutrients and calories that is needed for full recovery. Patient is well aware this.  She does not want NG tube or PEG placement.  Her reduced oral intake could be related to her Cognitive/Mental mindset and that she needed to create a behavior plan as to how and when she would try to eat/graze at foods/liquids. She is encouraged them to f/u w/ a Dietician to gain information on frequent mini meals/snacks throughout the day to meet her needs nutritionally.  DISCHARGE CONDITIONS AND DIET:   Stable  Mech Soft diet consistency for easier mastication and energy conservation; Thin liquids. General aspiration precautions.   Medication Administration: Whole meds with puree (crush if needed   CONSULTS OBTAINED:  Treatment Team:  Lavonia Dana, MD Lucilla Lame, MD Clapacs, Madie Reno, MD  DRUG ALLERGIES:  No Known Allergies  DISCHARGE MEDICATIONS:   Current Discharge Medication List    START taking these medications   Details  protein supplement shake (PREMIER PROTEIN) LIQD Take 325 mLs (11 oz total) by mouth 2 (two) times daily between meals. Qty: 19500 mL, Refills: 1    vancomycin (VANCOCIN) 50 mg/mL oral solution Take 2.5 mLs (125 mg total) by mouth every 6 (six) hours. Qty: 110 mL, Refills: 0    vitamin C (VITAMIN C) 500 MG tablet Take 1 tablet (500 mg total) by mouth 2 (two) times daily. Qty: 60 tablet, Refills: 0    zinc sulfate  220 (50 Zn) MG capsule Take 1 capsule (220 mg total) by mouth 2 (two) times a week. Qty: 60 capsule, Refills: 0      CONTINUE these medications which have CHANGED   Details  ALPRAZolam (XANAX) 0.25 MG tablet Take 1 tablet (0.25 mg total) by mouth at bedtime as needed for anxiety. Qty: 30 tablet, Refills: 0    metoprolol tartrate (LOPRESSOR) 25 MG tablet Take 0.5 tablets (12.5  mg total) by mouth 2 (two) times daily. Qty: 60 tablet, Refills: 0    QUEtiapine (SEROQUEL) 50 MG tablet Take 1 tablet (50 mg total) by mouth at bedtime. Qty: 30 tablet, Refills: 0      CONTINUE these medications which have NOT CHANGED   Details  amiodarone (PACERONE) 400 MG tablet Take 1 tablet (400 mg total) by mouth 2 (two) times daily. Qty: 60 tablet, Refills: 0    collagenase (SANTYL) ointment Apply topically daily. Qty: 15 g, Refills: 0    famotidine (PEPCID) 20 MG tablet Take 1 tablet (20 mg total) by mouth at bedtime. Qty: 30 tablet, Refills: 0    hydrocerin (EUCERIN) CREA Apply 1 application topically 2 (two) times daily. To dry areas on feet. Qty: 454 g, Refills: 0    levothyroxine (SYNTHROID, LEVOTHROID) 100 MCG tablet Take 1 tablet (100 mcg total) by mouth daily before breakfast. Qty: 30 tablet, Refills: 0    megestrol (MEGACE) 40 MG tablet Take 1 tablet (40 mg total) by mouth 2 (two) times daily. Qty: 60 tablet, Refills: 1    midodrine (PROAMATINE) 10 MG tablet Take 1 tablet (10 mg total) by mouth 3 (three) times daily with meals. Qty: 90 tablet, Refills: 0    multivitamin (RENA-VIT) TABS tablet Take 1 tablet by mouth at bedtime. Qty: 30 tablet, Refills: 0    senna-docusate (SENOKOT-S) 8.6-50 MG tablet Take 2 tablets by mouth 2 (two) times daily. Qty: 120 tablet, Refills: 0    sertraline (ZOLOFT) 50 MG tablet Take 50 mg by mouth daily.    polyethylene glycol (MIRALAX / GLYCOLAX) packet Take 17 g by mouth daily as needed. For constipation Qty: 60 each, Refills: 0      STOP taking these medications     apixaban (ELIQUIS) 5 MG TABS tablet      sucroferric oxyhydroxide (VELPHORO) 500 MG chewable tablet      misoprostol (CYTOTEC) 200 MCG tablet      nystatin (MYCOSTATIN) 100000 UNIT/ML suspension           Today   CHIEF COMPLAINT:  Trying to eat she wants to eat whatever she would like    VITAL SIGNS:  Blood pressure 100/88, pulse 81,  temperature 99.4 F (37.4 C), temperature source Oral, resp. rate 16, height 5\' 5"  (1.651 m), weight 53.5 kg (118 lb), SpO2 100 %.   REVIEW OF SYSTEMS:  Review of Systems  Constitutional: Positive for malaise/fatigue. Negative for chills and fever.       Decreased appetitie  HENT: Negative.  Negative for ear discharge, ear pain, hearing loss, nosebleeds and sore throat.   Eyes: Negative.  Negative for blurred vision and pain.  Respiratory: Negative.  Negative for cough, hemoptysis, shortness of breath and wheezing.   Cardiovascular: Negative.  Negative for chest pain, palpitations and leg swelling.  Gastrointestinal: Negative.  Negative for abdominal pain, blood in stool, diarrhea, nausea and vomiting.  Genitourinary: Negative.  Negative for dysuria.  Musculoskeletal: Negative.  Negative for back pain.  Skin: Negative.   Neurological: Positive for weakness.  Negative for dizziness, tremors, speech change, focal weakness, seizures and headaches.  Endo/Heme/Allergies: Negative.  Does not bruise/bleed easily.  Psychiatric/Behavioral: Negative.  Negative for depression, hallucinations and suicidal ideas.     PHYSICAL EXAMINATION:  GENERAL:  66 y.o.-year-old patient lying in the bed with no acute distress.  NECK:  Supple, no jugular venous distention. No thyroid enlargement, no tenderness.  LUNGS: Normal breath sounds bilaterally, no wheezing, rales,rhonchi  No use of accessory muscles of respiration.  CARDIOVASCULAR: S1, S2 normal. No murmurs, rubs, or gallops.  ABDOMEN: Soft, non-tender, non-distended. Bowel sounds present. No organomegaly or mass.  EXTREMITIES: No pedal edema, cyanosis, or clubbing.  PSYCHIATRIC: The patient is alert and oriented x 3.  SKIN: No obvious rash, lesion, or ulcer. bruising on arms  DATA REVIEW:   CBC  Recent Labs Lab 07/12/17 1440  WBC 14.8*  HGB 9.1*  HCT 27.3*  PLT 151    Chemistries   Recent Labs Lab 07/06/17 1604  07/13/17 0557  NA 140   < > 138  K 2.5*  < > 3.8  CL 99*  < > 101  CO2 27  < > 28  GLUCOSE 86  < > 106*  BUN 27*  < > 15  CREATININE 3.02*  < > 1.87*  CALCIUM 8.1*  < > 8.3*  AST 41  --   --   ALT 34  --   --   ALKPHOS 183*  --   --   BILITOT 1.4*  --   --   < > = values in this interval not displayed.  Cardiac Enzymes No results for input(s): TROPONINI in the last 168 hours.  Microbiology Results  @MICRORSLT48 @  RADIOLOGY:  No results found.    Current Discharge Medication List    START taking these medications   Details  protein supplement shake (PREMIER PROTEIN) LIQD Take 325 mLs (11 oz total) by mouth 2 (two) times daily between meals. Qty: 19500 mL, Refills: 1    vancomycin (VANCOCIN) 50 mg/mL oral solution Take 2.5 mLs (125 mg total) by mouth every 6 (six) hours. Qty: 110 mL, Refills: 0    vitamin C (VITAMIN C) 500 MG tablet Take 1 tablet (500 mg total) by mouth 2 (two) times daily. Qty: 60 tablet, Refills: 0    zinc sulfate 220 (50 Zn) MG capsule Take 1 capsule (220 mg total) by mouth 2 (two) times a week. Qty: 60 capsule, Refills: 0      CONTINUE these medications which have CHANGED   Details  ALPRAZolam (XANAX) 0.25 MG tablet Take 1 tablet (0.25 mg total) by mouth at bedtime as needed for anxiety. Qty: 30 tablet, Refills: 0    metoprolol tartrate (LOPRESSOR) 25 MG tablet Take 0.5 tablets (12.5 mg total) by mouth 2 (two) times daily. Qty: 60 tablet, Refills: 0    QUEtiapine (SEROQUEL) 50 MG tablet Take 1 tablet (50 mg total) by mouth at bedtime. Qty: 30 tablet, Refills: 0      CONTINUE these medications which have NOT CHANGED   Details  amiodarone (PACERONE) 400 MG tablet Take 1 tablet (400 mg total) by mouth 2 (two) times daily. Qty: 60 tablet, Refills: 0    collagenase (SANTYL) ointment Apply topically daily. Qty: 15 g, Refills: 0    famotidine (PEPCID) 20 MG tablet Take 1 tablet (20 mg total) by mouth at bedtime. Qty: 30 tablet, Refills: 0    hydrocerin (EUCERIN)  CREA Apply 1 application topically 2 (two) times daily. To dry  areas on feet. Qty: 454 g, Refills: 0    levothyroxine (SYNTHROID, LEVOTHROID) 100 MCG tablet Take 1 tablet (100 mcg total) by mouth daily before breakfast. Qty: 30 tablet, Refills: 0    megestrol (MEGACE) 40 MG tablet Take 1 tablet (40 mg total) by mouth 2 (two) times daily. Qty: 60 tablet, Refills: 1    midodrine (PROAMATINE) 10 MG tablet Take 1 tablet (10 mg total) by mouth 3 (three) times daily with meals. Qty: 90 tablet, Refills: 0    multivitamin (RENA-VIT) TABS tablet Take 1 tablet by mouth at bedtime. Qty: 30 tablet, Refills: 0    senna-docusate (SENOKOT-S) 8.6-50 MG tablet Take 2 tablets by mouth 2 (two) times daily. Qty: 120 tablet, Refills: 0    sertraline (ZOLOFT) 50 MG tablet Take 50 mg by mouth daily.    polyethylene glycol (MIRALAX / GLYCOLAX) packet Take 17 g by mouth daily as needed. For constipation Qty: 60 each, Refills: 0      STOP taking these medications     apixaban (ELIQUIS) 5 MG TABS tablet      sucroferric oxyhydroxide (VELPHORO) 500 MG chewable tablet      misoprostol (CYTOTEC) 200 MCG tablet      nystatin (MYCOSTATIN) 100000 UNIT/ML suspension           Management plans discussed with the patient and husband and they are in agreement. Stable for discharge SNF  Patient should follow up with pcp  CODE STATUS:     Code Status Orders        Start     Ordered   07/06/17 1852  Full code  Continuous     07/06/17 1851    Code Status History    Date Active Date Inactive Code Status Order ID Comments User Context   06/06/2017  6:10 PM 06/06/2017  6:10 PM Full Code [id number]  Bary Leriche, PA-C Inpatient   06/06/2017  6:10 PM 06/19/2017  6:49 PM Full Code [id number]  Bary Leriche, PA-C Inpatient   06/03/2017 12:44 AM 06/06/2017  5:51 PM Full Code [id number]  Etta Quill, DO Inpatient   05/07/2017  6:46 PM 06/03/2017 12:44 AM Full Code [id number]  Bary Leriche, PA-C Inpatient    05/07/2017  6:46 PM 05/07/2017  6:46 PM Full Code [id number]  Bary Leriche, PA-C Inpatient   05/01/2017 11:47 AM 05/07/2017  6:18 PM Full Code [id number]  Lavonia Dana, MD Inpatient   04/19/2017 10:50 AM 05/01/2017 11:47 AM DNR [id number]  Flora Lipps, MD Inpatient   04/08/2017  6:50 AM 04/19/2017 10:50 AM Full Code [id number]  Harrie Foreman, MD Inpatient      TOTAL TIME TAKING CARE OF THIS PATIENT: 37 minutes.    Note: This dictation was prepared with Dragon dictation along with smaller phrase technology. Any transcriptional errors that result from this process are unintentional.  Deaysia Grigoryan M.D on 07/13/2017 at 10:32 AM  Between 7am to 6pm - Pager - [phone] After 6pm go to [email or web address] - password [email or web address]  Office  [phone]  CC: Primary care physician; Tracie Harrier, MD

## 2017-07-13 NOTE — Progress Notes (Signed)
[facility], [place] 07/13/17  Subjective:    patient appears well. Denies any acute c/o Able to drink protein shakes No nausea, vomiting, SOB reported  Objective:  Vital signs in last 24 hours:  Temp:  [97.6 F (36.4 C)-99.4 F (37.4 C)] 99.4 F (37.4 C) (10/05 0800) Pulse Rate:  [75-110] 81 (10/05 0800) Resp:  [16-20] 16 (10/05 0800) BP: (72-142)/(50-88) 100/88 (10/05 0800) SpO2:  [99 %-100 %] 100 % (10/05 0800) Weight:  [53.5 kg (118 lb)] 53.5 kg (118 lb) (10/04 1750)  Weight change:  Filed Weights   07/06/17 2000 07/07/17 1325 07/12/17 1750  Weight: 59.4 kg (131 lb) 59.2 kg (130 lb 8.2 oz) 53.5 kg (118 lb)    Intake/Output:    Intake/Output Summary (Last 24 hours) at 07/13/17 1148 Last data filed at 07/12/17 1740  Gross per 24 hour  Intake                0 ml  Output              500 ml  Net             -500 ml   Physical Exam: General: NAD, laying in bed, appears more alert than yesterday  Head:  Moist oral mucosal membranes  Eyes: Anicteric,  Neck: Supple,   Lungs:  Decreased breath sounds at bases  Heart: Irregular, No rub  Abdomen:  Soft, nontender,   Extremities: trace peripheral edema.  Neurologic:  able to answer questions,    Skin: +sacral decub  Access: RIJ permcath, bruising noted around permcath site    Basic Metabolic Panel:   Recent Labs Lab 07/06/17 1604 07/07/17 0333 07/10/17 1101 07/12/17 1440 07/13/17 0557  NA 140 142 134* 136 138  K 2.5* 3.5 3.7 3.9 3.8  CL 99* 103 101 99* 101  CO2 27 26 26 26 28   GLUCOSE 86 85 109* 112* 106*  BUN 27* 33* 34* 34* 15  CREATININE 3.02* 3.41* 3.97* 3.17* 1.87*  CALCIUM 8.1* 8.4* 8.2* 8.4* 8.3*  PHOS  --   --  <1.0* 1.5*  1.6*  --      CBC:  Recent Labs Lab 07/06/17 1604  07/07/17 0333  07/07/17 1922 07/08/17 0516 07/09/17 0423 07/10/17 1101 07/12/17 1440  WBC 10.8  --  9.9  --   --  13.3*  --  12.6* 14.8*  HGB 9.9*  < > 11.0*  < > 9.9* 9.9* 9.7* 9.8*  9.1*  HCT 29.0*  --  33.2*  --   --  29.4*  --  29.3* 27.3*  MCV 105.1*  --  106.8*  --   --  106.8*  --  107.4* 104.9*  PLT 225  --  208  --   --  195  --  180 151  < > = values in this interval not displayed.    Lab Results  Component Value Date   HEPBSAG Negative 07/07/2017   HEPBSAB Non Reactive 07/07/2017   HEPBIGM Negative 04/09/2017      Microbiology:  Recent Results (from the past 240 hour(s))  MRSA PCR Screening     Status: None   Collection Time: 07/06/17  8:21 PM  Result Value Ref Range Status   MRSA by PCR NEGATIVE NEGATIVE Final    Comment:        The GeneXpert MRSA Assay (FDA approved for NASAL specimens only), is one component of a comprehensive MRSA colonization surveillance program. It is not intended  to diagnose MRSA infection nor to guide or monitor treatment for MRSA infections.   Gastrointestinal Panel by PCR , Stool     Status: None   Collection Time: 07/09/17  1:48 PM  Result Value Ref Range Status   Campylobacter species NOT DETECTED NOT DETECTED Final   Plesimonas shigelloides NOT DETECTED NOT DETECTED Final   Salmonella species NOT DETECTED NOT DETECTED Final   Yersinia enterocolitica NOT DETECTED NOT DETECTED Final   Vibrio species NOT DETECTED NOT DETECTED Final   Vibrio cholerae NOT DETECTED NOT DETECTED Final   Enteroaggregative E coli (EAEC) NOT DETECTED NOT DETECTED Final   Enteropathogenic E coli (EPEC) NOT DETECTED NOT DETECTED Final   Enterotoxigenic E coli (ETEC) NOT DETECTED NOT DETECTED Final   Shiga like toxin producing E coli (STEC) NOT DETECTED NOT DETECTED Final   Shigella/Enteroinvasive E coli (EIEC) NOT DETECTED NOT DETECTED Final   Cryptosporidium NOT DETECTED NOT DETECTED Final   Cyclospora cayetanensis NOT DETECTED NOT DETECTED Final   Entamoeba histolytica NOT DETECTED NOT DETECTED Final   Giardia lamblia NOT DETECTED NOT DETECTED Final   Adenovirus F40/41 NOT DETECTED NOT DETECTED Final   Astrovirus NOT DETECTED NOT  DETECTED Final   Norovirus GI/GII NOT DETECTED NOT DETECTED Final   Rotavirus A NOT DETECTED NOT DETECTED Final   Sapovirus (I, II, IV, and V) NOT DETECTED NOT DETECTED Final  C difficile quick scan w PCR reflex     Status: Abnormal   Collection Time: 07/09/17  1:48 PM  Result Value Ref Range Status   C Diff antigen POSITIVE (A) NEGATIVE Final   C Diff toxin NEGATIVE NEGATIVE Final   C Diff interpretation Results are indeterminate. See PCR results.  Final  Clostridium Difficile by PCR     Status: Abnormal   Collection Time: 07/09/17  1:48 PM  Result Value Ref Range Status   Toxigenic C Difficile by pcr POSITIVE (A) NEGATIVE Final    Comment: Positive for toxigenic C. difficile with little to no toxin production. Only treat if clinical presentation suggests symptomatic illness.    Coagulation Studies: No results for input(s): LABPROT, INR in the last 72 hours.  Urinalysis: No results for input(s): COLORURINE, LABSPEC, PHURINE, GLUCOSEU, HGBUR, BILIRUBINUR, KETONESUR, PROTEINUR, UROBILINOGEN, NITRITE, LEUKOCYTESUR in the last 72 hours.  Invalid input(s): APPERANCEUR    Imaging: No results found.   Medications:    . amiodarone  200 mg Oral Daily  . chlorhexidine  15 mL Mouth Rinse BID  . collagenase   Topical Daily  . epoetin (EPOGEN/PROCRIT) injection  10,000 Units Intravenous Q T,Th,Sa-HD  . levothyroxine  100 mcg Oral QAC breakfast  . mouth rinse  15 mL Mouth Rinse q12n4p  . megestrol  40 mg Oral BID  . metoprolol tartrate  12.5 mg Oral BID  . midodrine  10 mg Oral TID WC  . multivitamin  1 tablet Oral QHS  . multivitamin with minerals  1 tablet Oral Once per day on Mon Thu  . pantoprazole (PROTONIX) IV  40 mg Intravenous Q12H  . protein supplement shake  11 oz Oral BID BM  . QUEtiapine  50 mg Oral QHS  . sertraline  25 mg Oral Daily  . vancomycin  125 mg Oral Q6H  . vitamin C  500 mg Oral BID  . [START ON 07/16/2017] zinc sulfate  220 mg Oral Once per day on Mon Thu    acetaminophen **OR** acetaminophen, ALPRAZolam, haloperidol lactate, ondansetron **OR** ondansetron (ZOFRAN) IV  Assessment/ Plan:  66 y.o.caucasian female with end stage renal disease on hemodialysis, hypertension, atrial fibrillation, hypothyroidism, congestive heart failure   TTS [facility]  1. End stage renal disease: with RIJ permcath.  TTS schedule  patient is potentially d/c today. Will plan to dialyze on Saturday if still in hospital  2. Hypotension:  - midodrine with treatment as necessary  3. Anemia of chronic kidney disease: with GI bleed.   EGD shows bleeding angioectasias in the stomach EPO with HD  4. Secondary Hyperparathyroidism:  - velphoro as outpatient.  - hold for now.  May restart once patient is able to take normal oral intake - phosphorus  Improved after replacement     LOS: 7 Itsel Opfer 10/5/201811:48 [place], [place]

## 2017-07-13 NOTE — Progress Notes (Signed)
[facility], [place] 07/13/17  Subjective:     HEMODIALYSIS FLOWSHEET:  Blood Flow Rate (mL/min): 250 mL/min Arterial Pressure (mmHg): -150 mmHg Venous Pressure (mmHg): 70 mmHg Transmembrane Pressure (mmHg): 70 mmHg Ultrafiltration Rate (mL/min): 300 mL/min Dialysate Flow Rate (mL/min): 600 ml/min Conductivity: Machine : 13.9 Conductivity: Machine : 13.9 Dialysis Fluid Bolus: Normal Saline Bolus Amount (mL): 200 mL Dialysate Change:  (new bicarb jug)  Decreased blood pressure during the treatment; UF goal lowered  Objective:  Vital signs in last 24 hours:  Temp:  [97.6 F (36.4 C)-98.5 F (36.9 C)] 98.4 F (36.9 C) (10/05 0623) Pulse Rate:  [74-110] 78 (10/05 0623) Resp:  [16-20] 18 (10/05 0623) BP: (72-142)/(50-82) 130/66 (10/05 0623) SpO2:  [99 %-100 %] 99 % (10/05 0623) Weight:  [53.5 kg (118 lb)] 53.5 kg (118 lb) (10/04 1750)  Weight change:  Filed Weights   07/06/17 2000 07/07/17 1325 07/12/17 1750  Weight: Shannon.4 kg (131 lb) Shannon.2 kg (130 lb 8.2 oz) 53.5 kg (118 lb)    Intake/Output:    Intake/Output Summary (Last 24 hours) at 07/13/17 0756 Last data filed at 07/12/17 1740  Gross per 24 hour  Intake                0 ml  Output              500 ml  Net             -500 ml   Physical Exam: General: NAD, laying in bed, appears more alert than yesterday  Head:  Moist oral mucosal membranes  Eyes: Anicteric,  Neck: Supple, trachea midline  Lungs:  Decreased breath sounds  Heart: No rub  Abdomen:  Soft, nontender,   Extremities: trace peripheral edema.  Neurologic: Nonfocal, able to answer questions, depressed mood  Skin: +sacral decub  Access: RIJ permcath, bruising noted around permcath site    Basic Metabolic Panel:   Recent Labs Lab 07/06/17 1604 07/07/17 0333 07/10/17 1101 07/12/17 1440 07/13/17 0557  NA 140 142 134* 136 138  K 2.5* 3.5 3.7 3.9 3.8  CL 99* 103 101 99* 101  CO2 27 26 26 26 28   GLUCOSE 86 85  109* 112* 106*  BUN 27* 33* 34* 34* 15  CREATININE 3.02* 3.41* 3.97* 3.17* 1.87*  CALCIUM 8.1* 8.4* 8.2* 8.4* 8.3*  PHOS  --   --  <1.0* 1.5*  1.6*  --      CBC:  Recent Labs Lab 07/06/17 1604  07/07/17 0333  07/07/17 1922 07/08/17 0516 07/09/17 0423 07/10/17 1101 07/12/17 1440  WBC 10.8  --  9.9  --   --  13.3*  --  12.6* 14.8*  HGB 9.9*  < > 11.0*  < > 9.9* 9.9* 9.7* 9.8* 9.1*  HCT 29.0*  --  33.2*  --   --  29.4*  --  29.3* 27.3*  MCV 105.1*  --  106.8*  --   --  106.8*  --  107.4* 104.9*  PLT 225  --  208  --   --  195  --  180 151  < > = values in this interval not displayed.   Lab Results  Component Value Date   HEPBSAG Negative 07/07/2017   HEPBSAB Non Reactive 07/07/2017   HEPBIGM Negative 04/09/2017      Microbiology:  Recent Results (from the past 240 hour(s))  MRSA PCR Screening     Status: None   Collection Time: 07/06/17  8:21  PM  Result Value Ref Range Status   MRSA by PCR NEGATIVE NEGATIVE Final    Comment:        The GeneXpert MRSA Assay (FDA approved for NASAL specimens only), is one component of a comprehensive MRSA colonization surveillance program. It is not intended to diagnose MRSA infection nor to guide or monitor treatment for MRSA infections.   Gastrointestinal Panel by PCR , Stool     Status: None   Collection Time: 07/09/17  1:48 PM  Result Value Ref Range Status   Campylobacter species NOT DETECTED NOT DETECTED Final   Plesimonas shigelloides NOT DETECTED NOT DETECTED Final   Salmonella species NOT DETECTED NOT DETECTED Final   Yersinia enterocolitica NOT DETECTED NOT DETECTED Final   Vibrio species NOT DETECTED NOT DETECTED Final   Vibrio cholerae NOT DETECTED NOT DETECTED Final   Enteroaggregative E coli (EAEC) NOT DETECTED NOT DETECTED Final   Enteropathogenic E coli (EPEC) NOT DETECTED NOT DETECTED Final   Enterotoxigenic E coli (ETEC) NOT DETECTED NOT DETECTED Final   Shiga like toxin producing E coli (STEC) NOT  DETECTED NOT DETECTED Final   Shigella/Enteroinvasive E coli (EIEC) NOT DETECTED NOT DETECTED Final   Cryptosporidium NOT DETECTED NOT DETECTED Final   Cyclospora cayetanensis NOT DETECTED NOT DETECTED Final   Entamoeba histolytica NOT DETECTED NOT DETECTED Final   Giardia lamblia NOT DETECTED NOT DETECTED Final   Adenovirus F40/41 NOT DETECTED NOT DETECTED Final   Astrovirus NOT DETECTED NOT DETECTED Final   Norovirus GI/GII NOT DETECTED NOT DETECTED Final   Rotavirus A NOT DETECTED NOT DETECTED Final   Sapovirus (I, II, IV, and V) NOT DETECTED NOT DETECTED Final  C difficile quick scan w PCR reflex     Status: Abnormal   Collection Time: 07/09/17  1:48 PM  Result Value Ref Range Status   C Diff antigen POSITIVE (A) NEGATIVE Final   C Diff toxin NEGATIVE NEGATIVE Final   C Diff interpretation Results are indeterminate. See PCR results.  Final  Clostridium Difficile by PCR     Status: Abnormal   Collection Time: 07/09/17  1:48 PM  Result Value Ref Range Status   Toxigenic C Difficile by pcr POSITIVE (A) NEGATIVE Final    Comment: Positive for toxigenic C. difficile with little to no toxin production. Only treat if clinical presentation suggests symptomatic illness.    Coagulation Studies: No results for input(s): LABPROT, INR in the last 72 hours.  Urinalysis: No results for input(s): COLORURINE, LABSPEC, PHURINE, GLUCOSEU, HGBUR, BILIRUBINUR, KETONESUR, PROTEINUR, UROBILINOGEN, NITRITE, LEUKOCYTESUR in the last 72 hours.  Invalid input(s): APPERANCEUR    Imaging: No results found.   Medications:    . amiodarone  200 mg Oral Daily  . chlorhexidine  15 mL Mouth Rinse BID  . collagenase   Topical Daily  . epoetin (EPOGEN/PROCRIT) injection  10,000 Units Intravenous Q T,Th,Sa-HD  . levothyroxine  100 mcg Oral QAC breakfast  . mouth rinse  15 mL Mouth Rinse q12n4p  . megestrol  40 mg Oral BID  . metoprolol tartrate  12.5 mg Oral BID  . midodrine  10 mg Oral TID WC  .  multivitamin  1 tablet Oral QHS  . pantoprazole (PROTONIX) IV  40 mg Intravenous Q12H  . protein supplement shake  11 oz Oral BID BM  . QUEtiapine  50 mg Oral QHS  . sertraline  25 mg Oral Daily  . vancomycin  125 mg Oral Q6H  . vitamin C  500 mg Oral  BID  . zinc sulfate  220 mg Oral Daily   acetaminophen **OR** acetaminophen, ALPRAZolam, haloperidol lactate, ondansetron **OR** ondansetron (ZOFRAN) IV  Assessment/ Plan:  66 y.o.caucasian [name] with end stage renal disease on hemodialysis, hypertension, atrial fibrillation, hypothyroidism, congestive heart failure   TTS [facility]  1. End stage renal disease: with RIJ permcath.  TTS schedule Hemodialysis planned for Saturday Patient seen during dialysis Tolerating well UF goal lowered due to low BP  2. Hypotension:  - midodrine with treatment as necessary  3. Anemia of chronic kidney disease: with GI bleed.   EGD shows bleeding angioectasias in the stomach EPO with HD  4. Secondary Hyperparathyroidism:  - velphoro as outpatient.  - hold for now.  May restart once patient is able to take normal oral intake - phosphorus was low yesterday.  Given IV replacement. Improved. Try to draw labs on dialysis days   LOS: 7 Kyree Fedorko 10/5/20187:56 AM  [facility] [place], [place]

## 2017-07-13 NOTE — Plan of Care (Signed)
Attempted re-visit to discuss [place] and code status as yesterday she was receiving dialysis at time of visit. She has been discharged.

## 2017-07-13 NOTE — Care Management (Addendum)
[facility] HD liaison notified of discharge to [facility]. CSW facilitating.  RNCM signing off

## 2017-07-15 LAB — VITAMIN K1, SERUM: VITAMIN K1: 0.59 ng/mL (ref 0.13–1.88)

## 2017-07-16 LAB — MISC LABCORP TEST (SEND OUT): Labcorp test code: 1479

## 2017-07-20 ENCOUNTER — Telehealth: Payer: Self-pay

## 2017-07-20 ENCOUNTER — Encounter: Payer: Self-pay | Admitting: Gastroenterology

## 2017-07-25 ENCOUNTER — Encounter: Payer: Medicare Other | Admitting: Physical Medicine & Rehabilitation

## 2017-07-27 ENCOUNTER — Ambulatory Visit: Payer: Medicare Other | Admitting: Cardiovascular Disease

## 2017-07-30 ENCOUNTER — Encounter (INDEPENDENT_AMBULATORY_CARE_PROVIDER_SITE_OTHER): Payer: Medicare Other

## 2017-07-30 ENCOUNTER — Encounter (INDEPENDENT_AMBULATORY_CARE_PROVIDER_SITE_OTHER): Payer: Medicare Other | Admitting: Vascular Surgery

## 2017-08-09 NOTE — Telephone Encounter (Signed)
Please review pathology from EGD on 07/09/17 and advise.

## 2017-08-09 DEATH — deceased

## 2018-04-02 IMAGING — DX DG CHEST 1V PORT
1 series · 1 of 1 positions shown · non-contrast
Comparison: 04/19/2017

CLINICAL DATA: Respiratory failure.  Followup exam.

EXAM:
PORTABLE CHEST 1 VIEW

[chest ap]
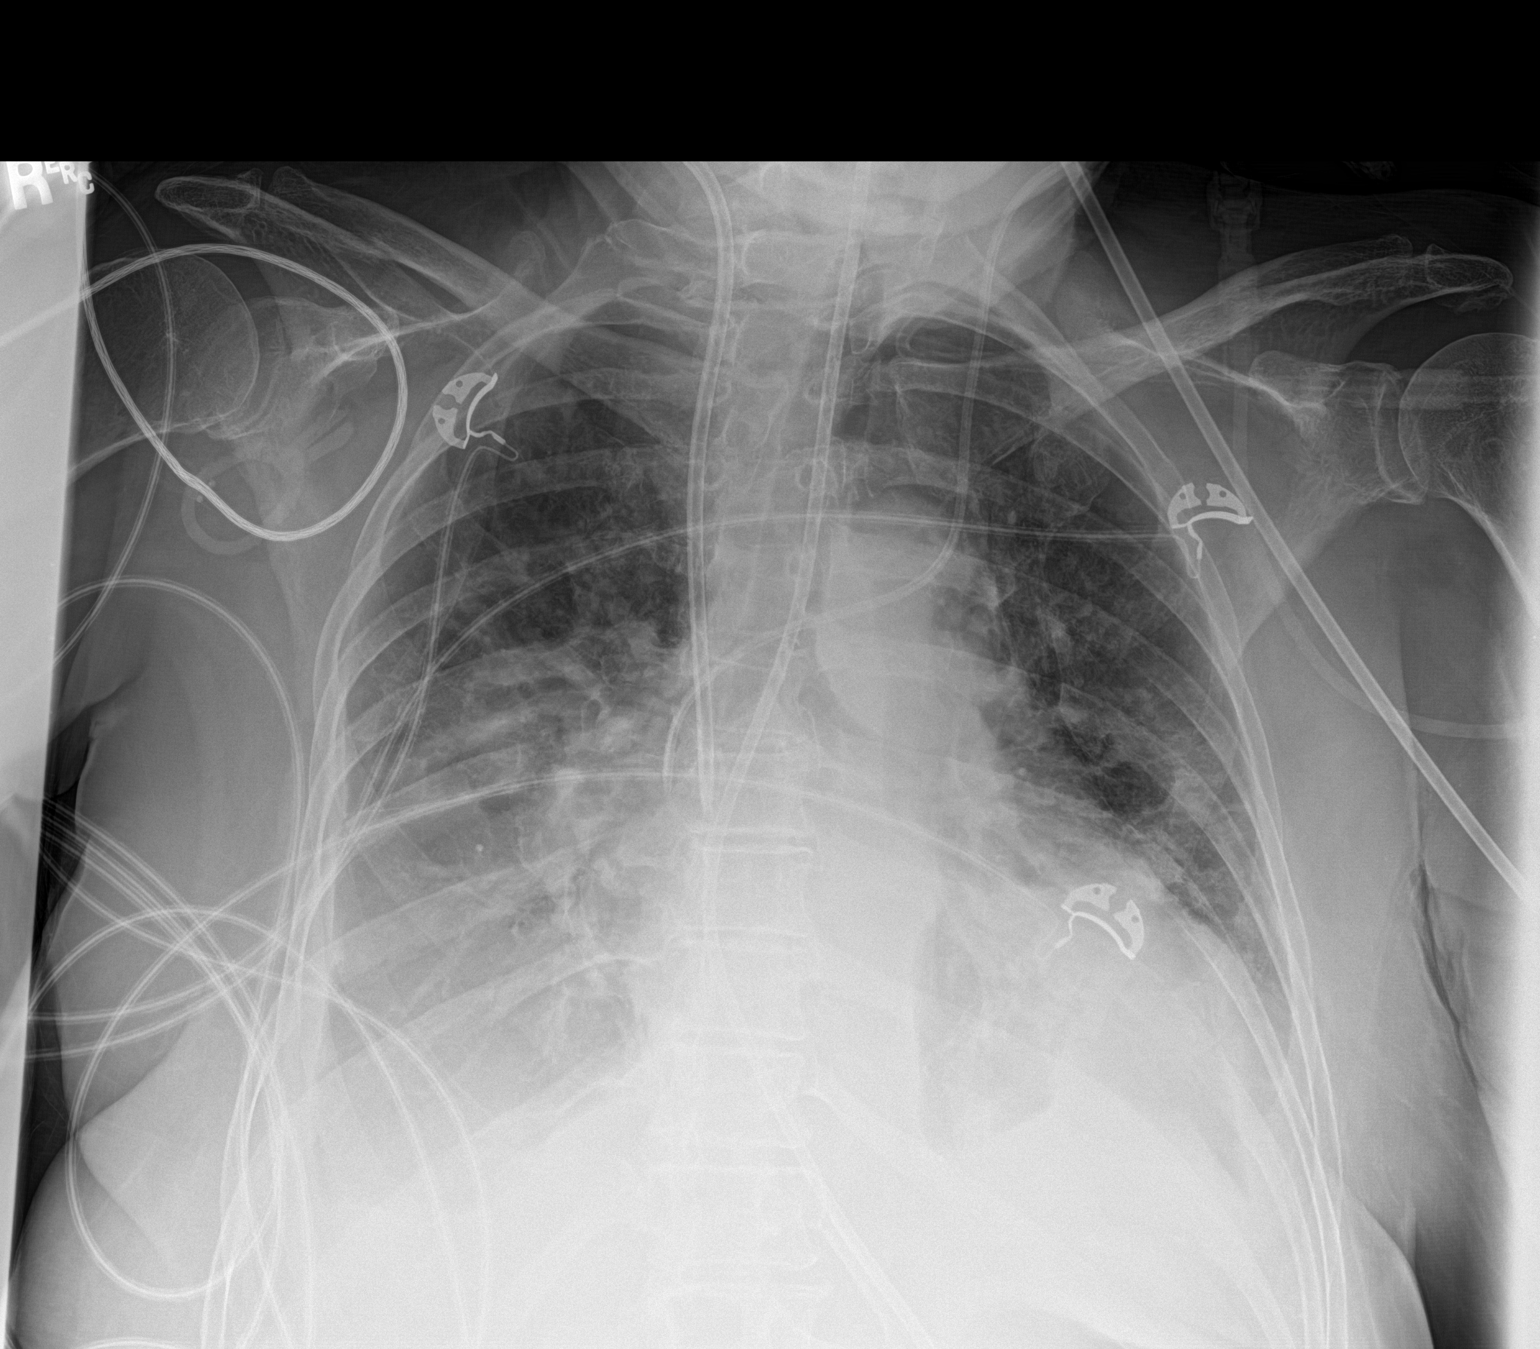

[1 of 1 positions shown; findings below may reference images not displayed]

FINDINGS: Has been mild improvement in lung aeration since the previous exam
with improved lung opacity most evident in the mid lungs. Persistent
lower lung zone opacity obscures hemidiaphragms, greater on the
right, likely combination of pleural effusions with atelectasis,
pneumonia or a combination. There is no evidence of pulmonary edema.

No pneumothorax.

New enteric feeding tube passes below the diaphragm and below the
included field of view.

Bilateral internal jugular central venous lines are stable and well
positioned.
IMPRESSION: 1. Mild improvement in lung aeration since the previous exam.
2. New enteric feeding tube passes below the diaphragm, not
completely visualized on this exam.

## 2018-05-11 IMAGING — DX DG CHEST 2V
2 series · 2 of 2 positions shown · non-contrast
Comparison: Chest x-ray dated April 21, 2017.

CLINICAL DATA: CHF.

EXAM:
CHEST  2 VIEW

[x chest ap]
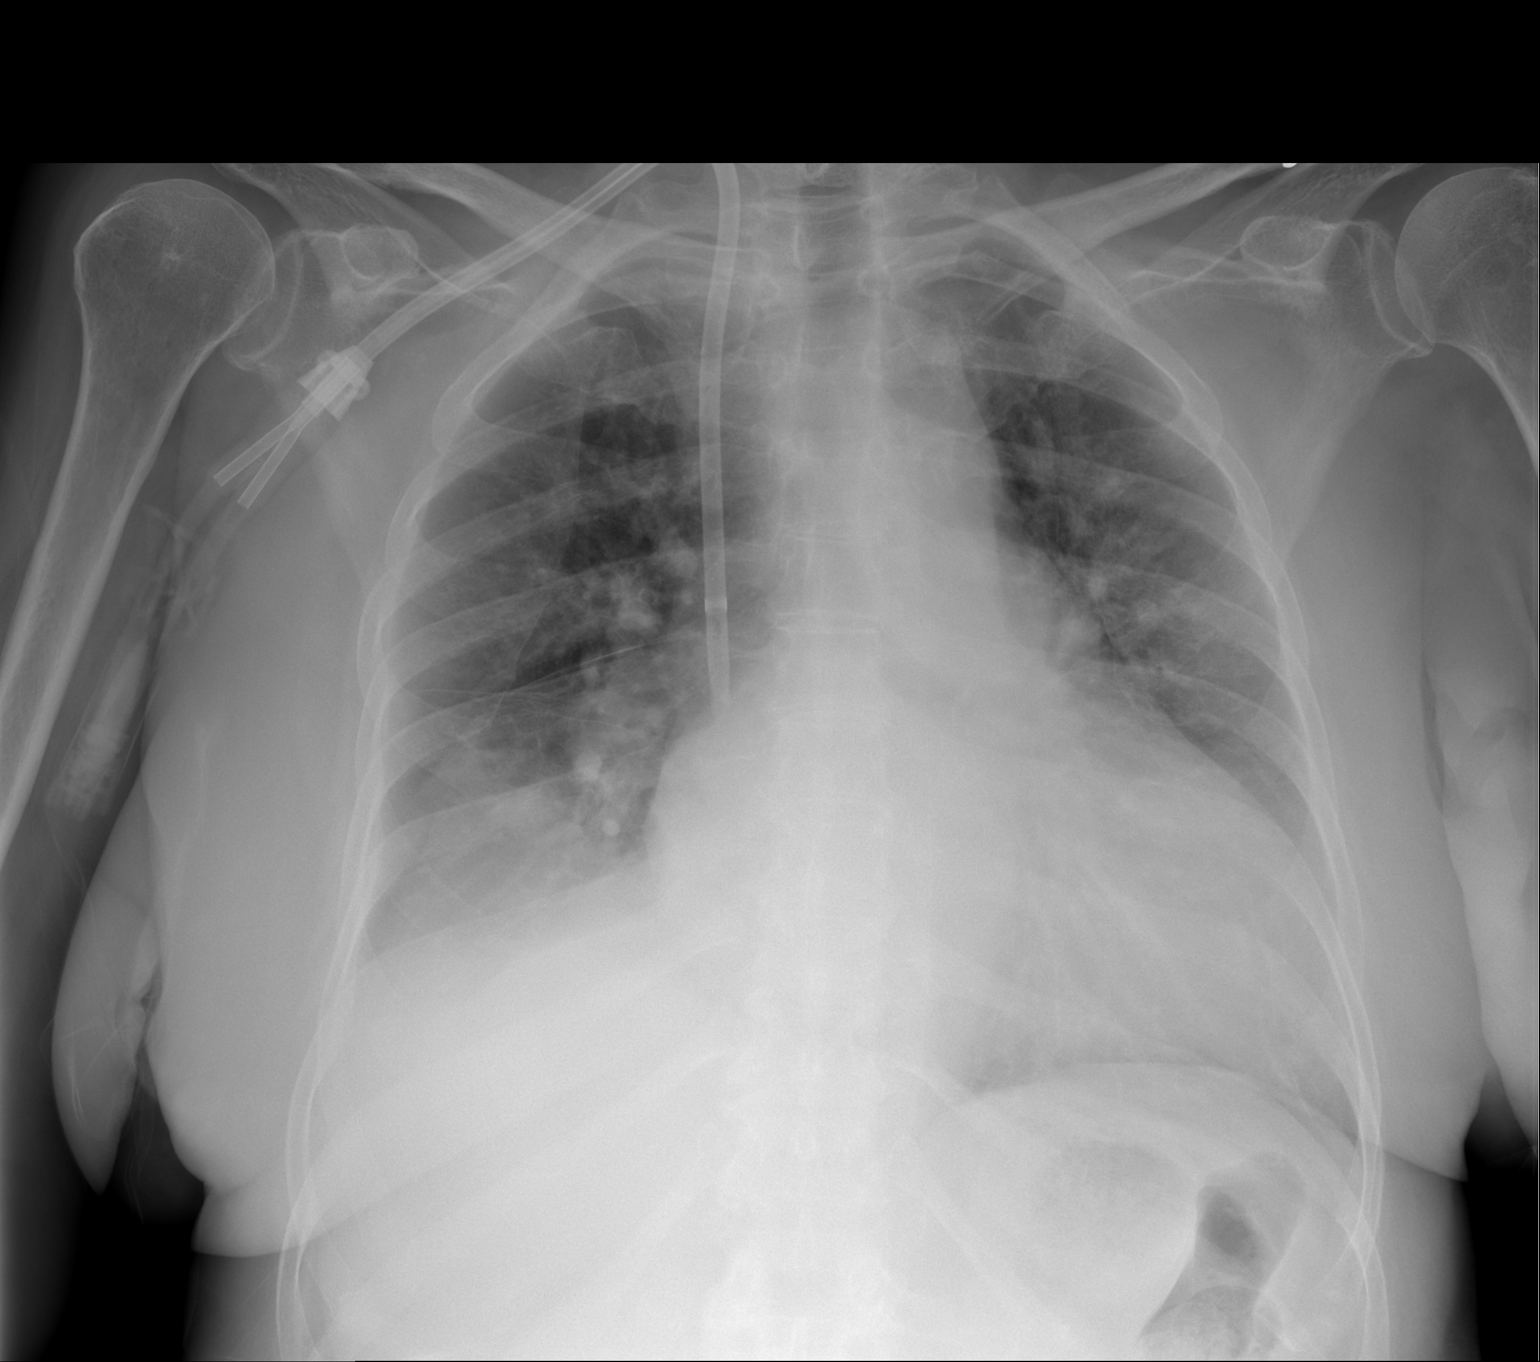

[w chest lat]
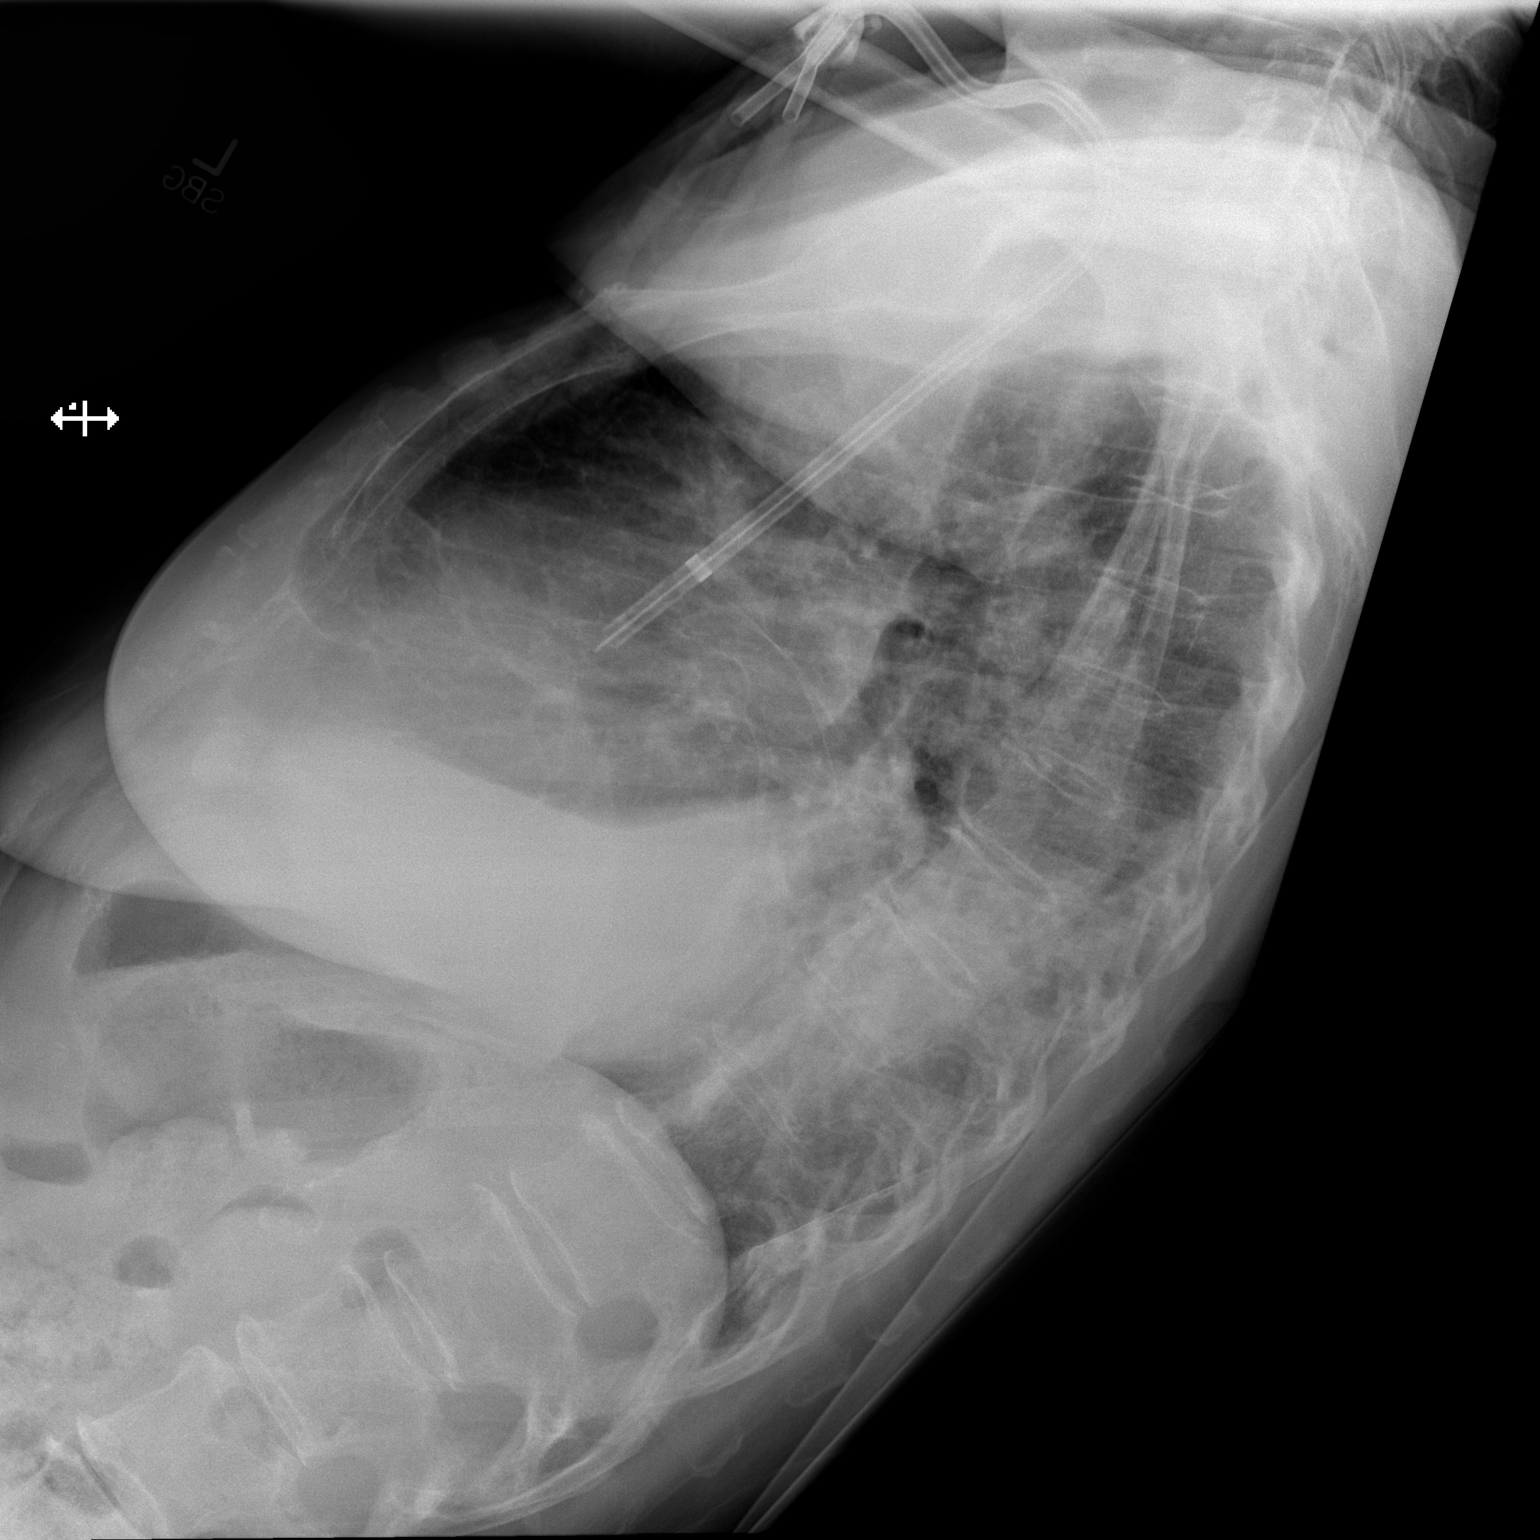

[2 of 2 positions shown; findings below may reference images not displayed]

FINDINGS: Interval removal of bilateral internal jugular central venous
catheters. Interval placement of a tunneled right internal jugular
dialysis catheter with the tip at the cavoatrial junction. Interval
removal of previously seen enteric tube. Cardiomegaly, unchanged.
Mild pulmonary vascular congestion. Mild hazy opacification of the
right lung base, likely a combination of pleural effusion and
atelectasis. No consolidation or pneumothorax. No acute osseous
abnormality.
IMPRESSION: 1. Cardiomegaly with mild pulmonary vascular congestion.
2. Small right pleural effusion with adjacent right lower lobe
opacity, likely atelectasis.

## 2018-05-31 IMAGING — DX DG CHEST 2V
2 series · 2 of 2 positions shown · non-contrast
Comparison: Chest x-ray 11/28/2016.

CLINICAL DATA: Leukocytosis.  Weakness.  Shortness of breath .

EXAM:
CHEST  2 VIEW

[x chest ap]
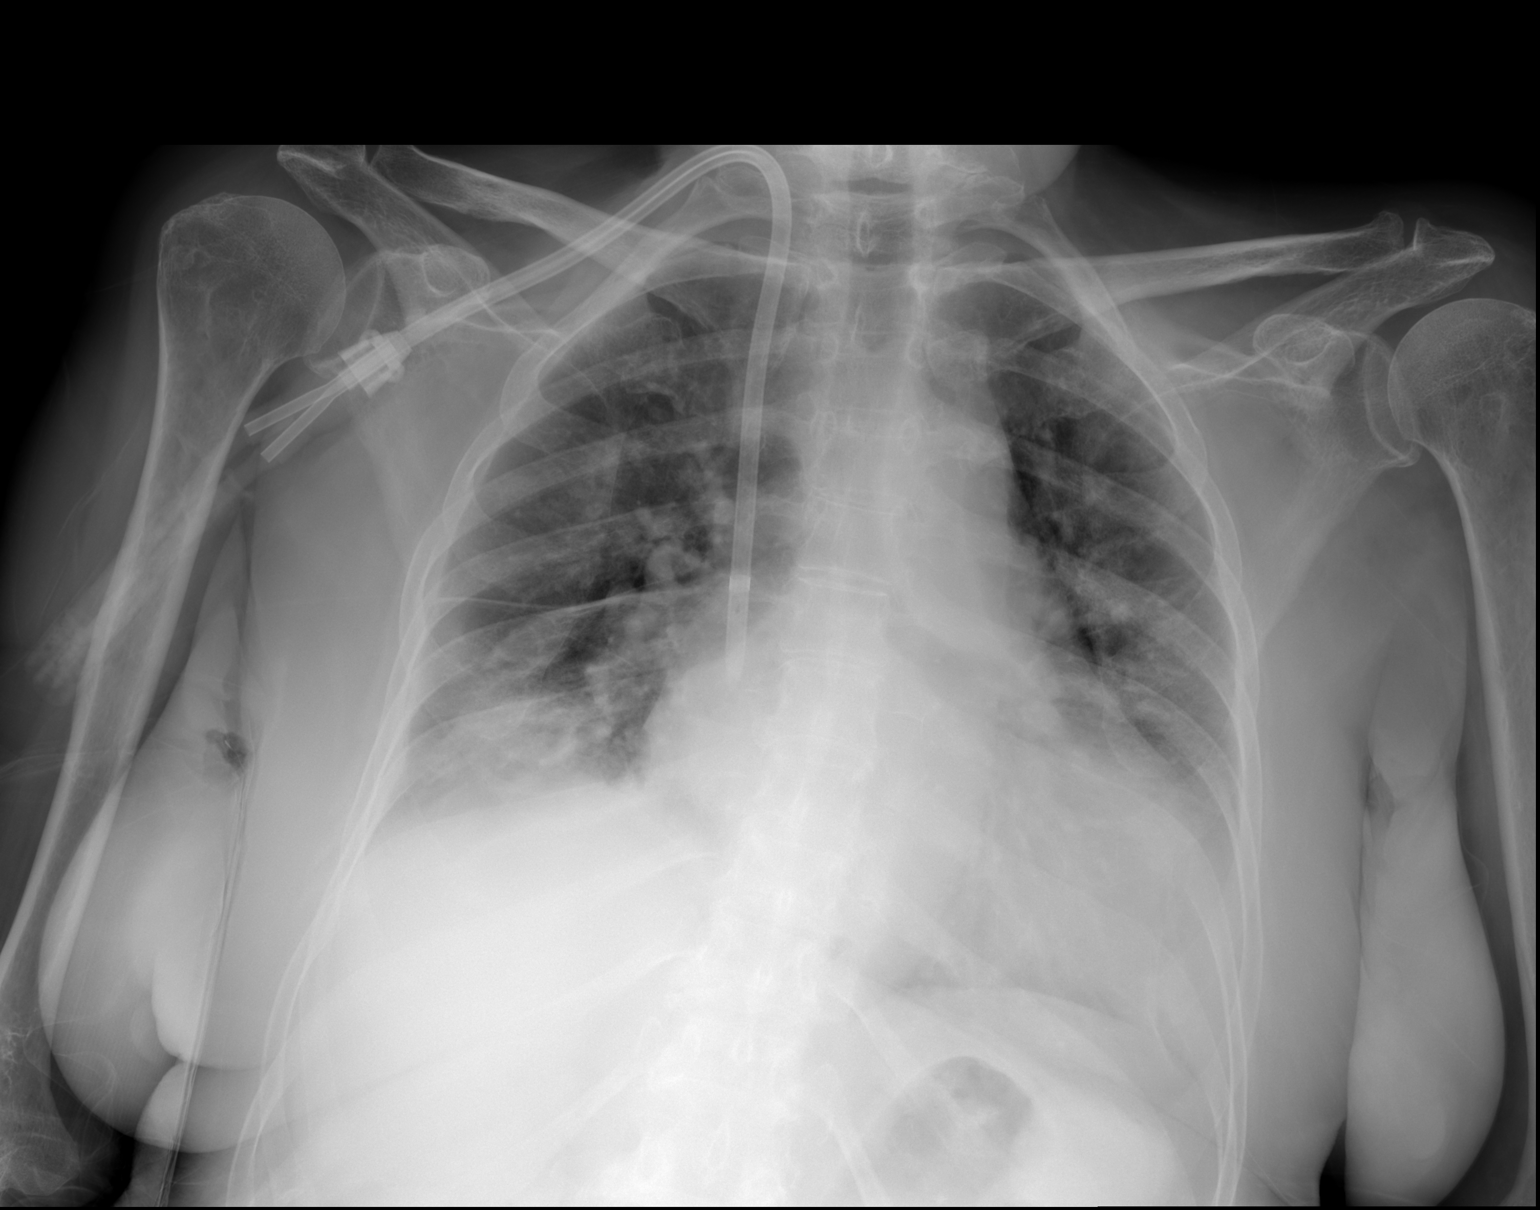

[w chest lat]
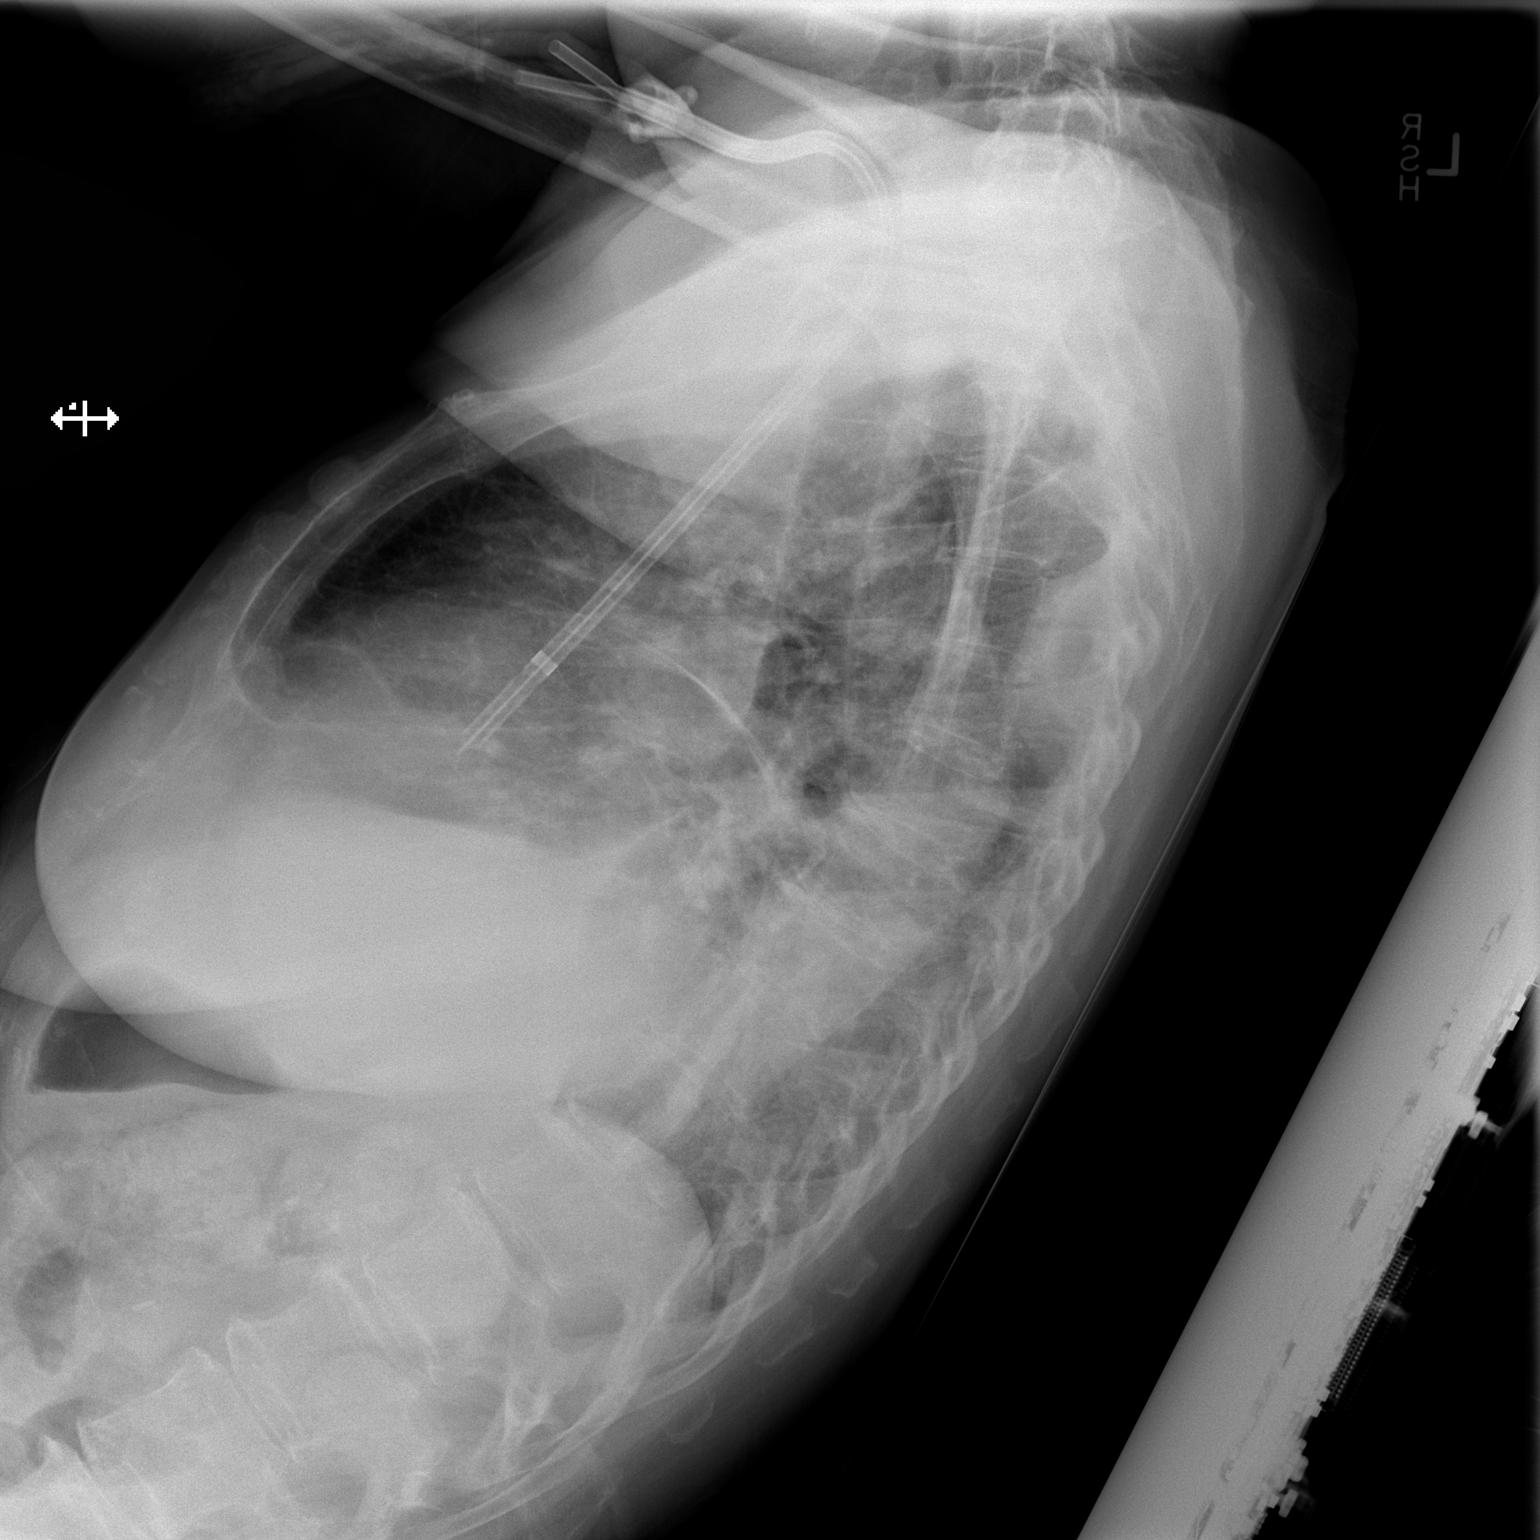

[2 of 2 positions shown; findings below may reference images not displayed]

FINDINGS: Right IJ dialysis catheter stable position. Stable cardiomegaly and
mild pulmonary venous congestion. Low lung volumes with basilar
atelectasis. Again mild right base infiltrate and small right
pleural effusion cannot be excluded. Small left pleural effusion
appears be present on today's exam.
IMPRESSION: 1.  Right IJ dialysis catheter stable position.

2. Stable cardiomegaly and mild pulmonary venous congestion. Again
mild right base infiltrate small right pleural effusion cannot be
excluded. Small left pleural effusion appears be present on today's
exam .

## 2019-08-02 IMAGING — US US TRANSVAGINAL NON-OB
1 series · 13 of 25 positions shown · non-contrast
Comparison: None.

CLINICAL DATA: Vaginal bleeding.



[Series 1: us transvaginal non-ob · 0.24mm/px · 49 acquisitions, 13 frames shown]
[im 1/49]
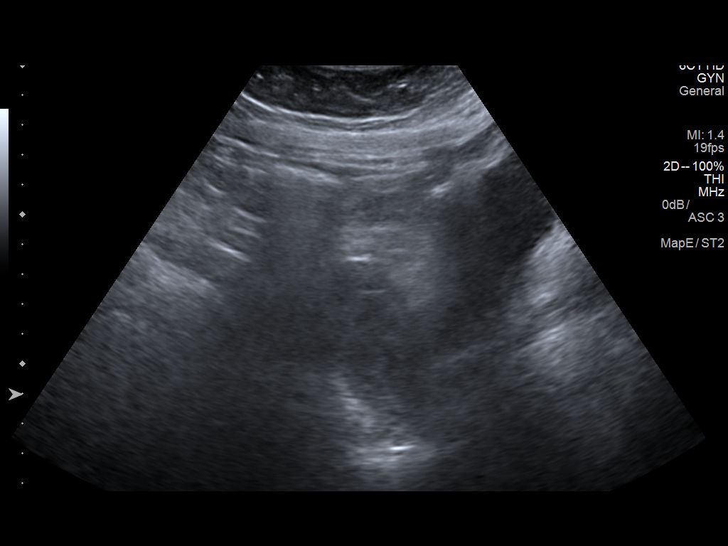
[im 5/49]
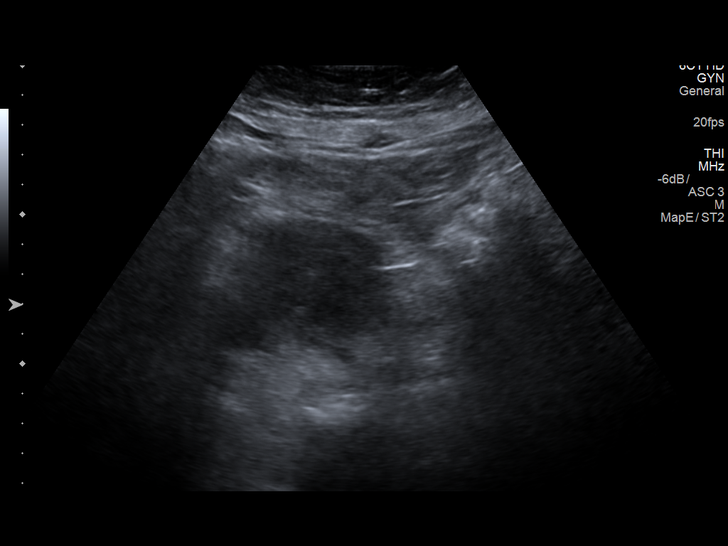
[im 9/49]
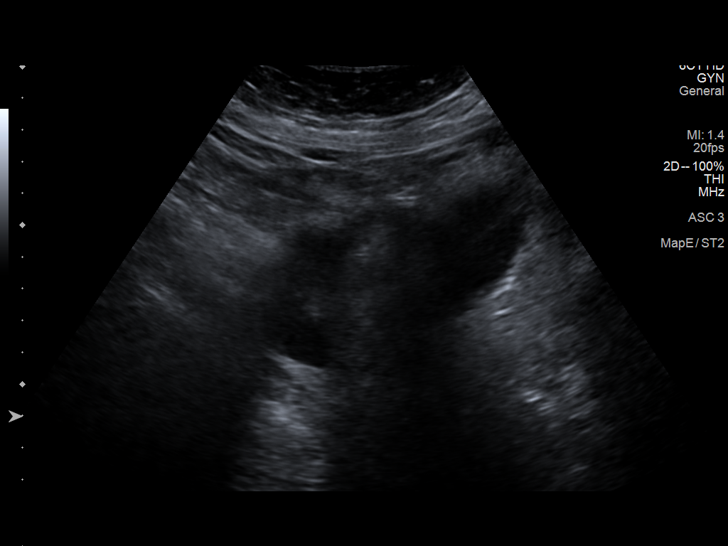
[im 13/49]
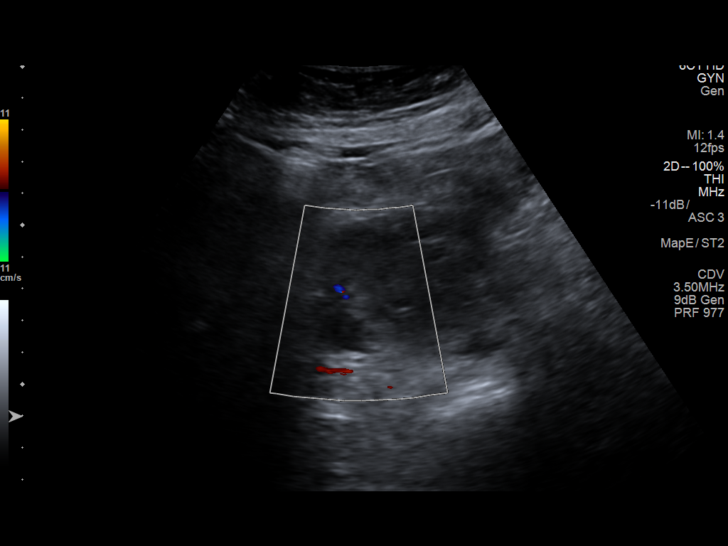
[im 17/49]
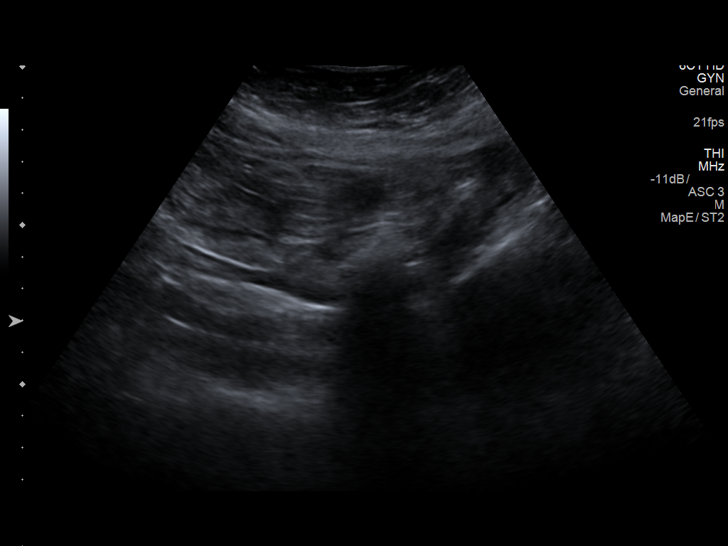
[im 21/49]
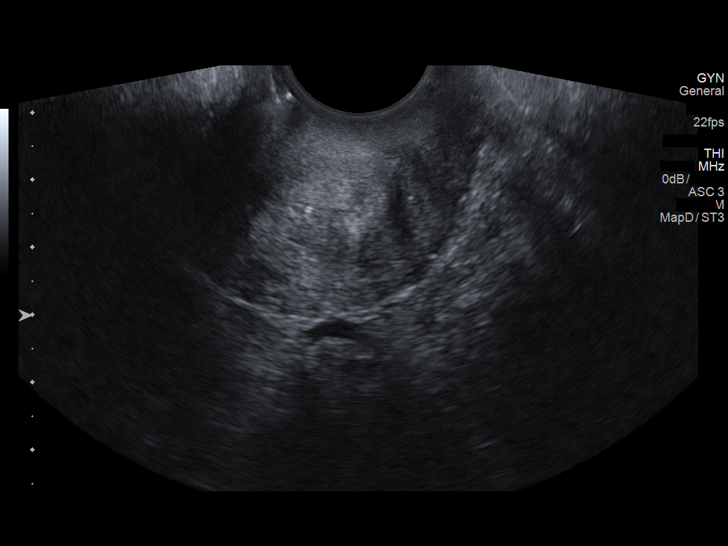
[im 25/49]
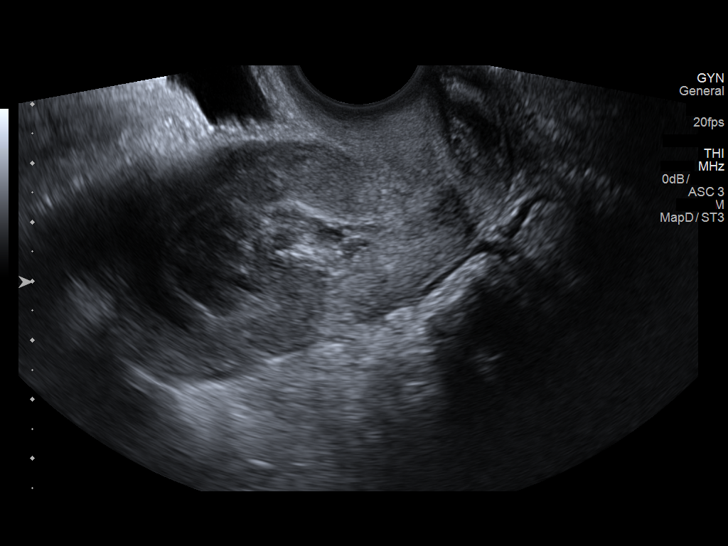
[im 29/49]
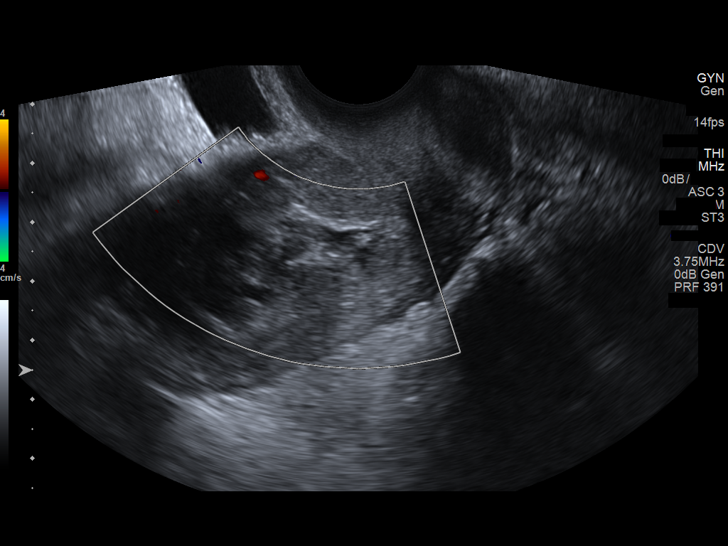
[im 33/49]
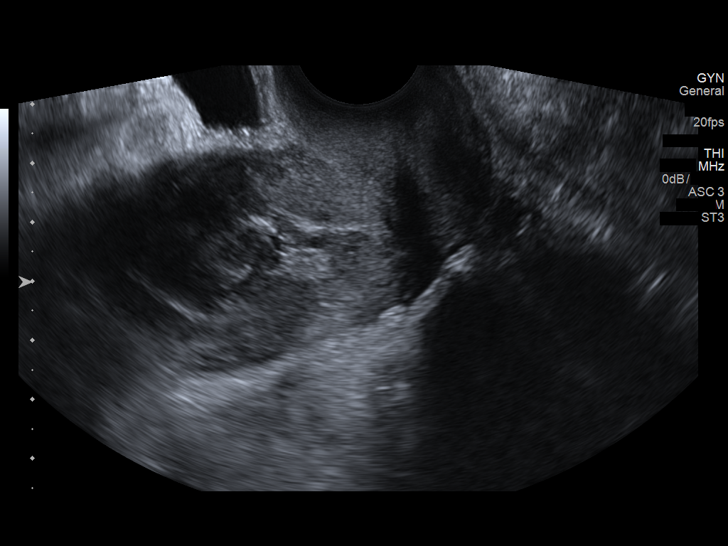
[im 37/49]
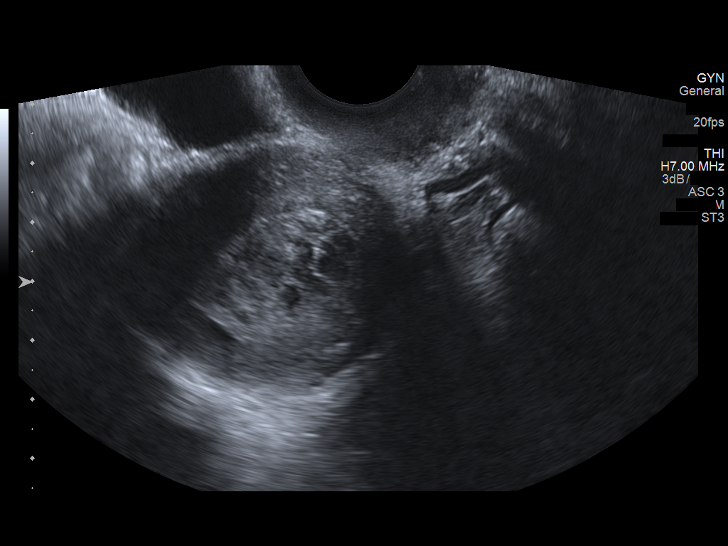
[im 41/49]
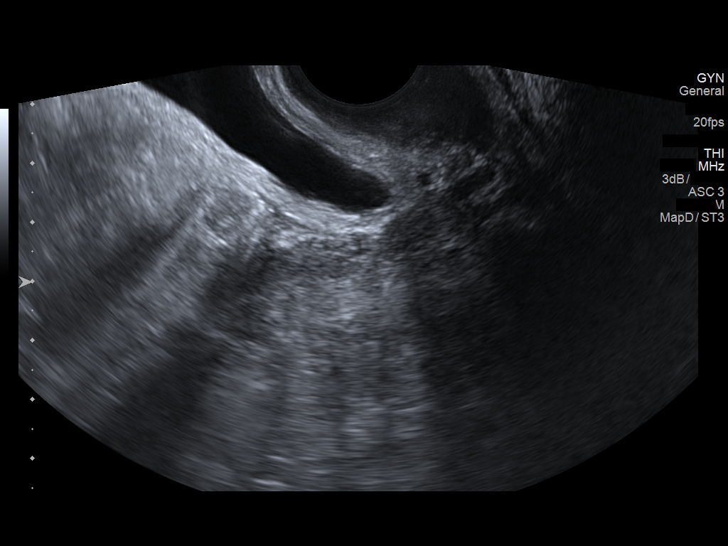
[im 45/49]
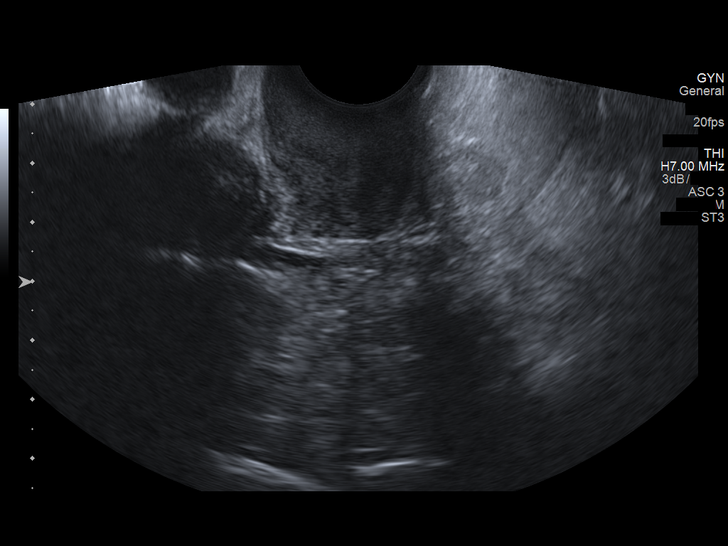
[im 49/49]
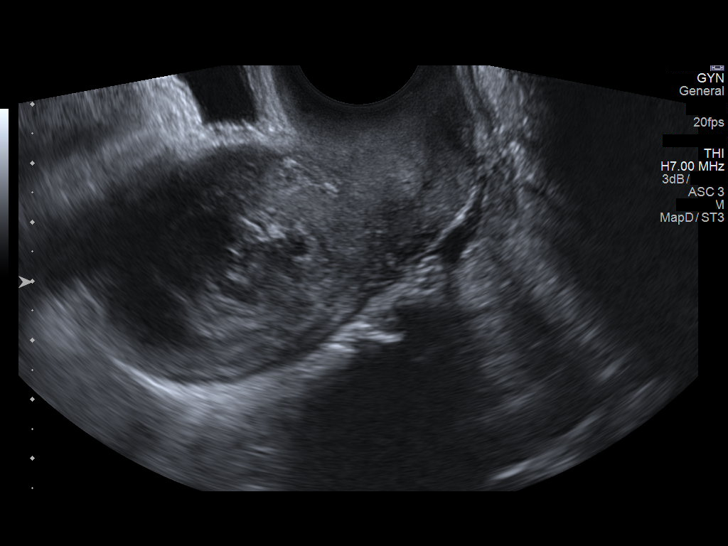

[13 of 25 positions shown; findings below may reference images not displayed]

FINDINGS: Uterus

Measurements: 7.2 x 3.7 x 5.1 cm. No fibroids.

Endometrium

Thickness: 22 mm, abnormal. There is a 3.7 x 2.0 x 3.4 cm
hyperechoic endometrial mass and a small amount of endometrial
fluid.

Right ovary

Not well visualized. Possible 2.7 x 1.6 x 1.4 cm cystic lesion in
the right adnexa.

Left ovary

Not visualized.  No adnexal mass.

Other findings

Trace free fluid.
IMPRESSION: 1. Markedly abnormal and thickened endometrium containing a focal
3.7 cm hyperechoic mass. In the setting of post-menopausal bleeding,
endometrial sampling is indicated to exclude carcinoma. If results
are benign, sonohysterogram should be considered for focal lesion
work-up prior to hysteroscopy. (Ref: Radiological Reasoning:
Algorithmic Workup of Abnormal Vaginal Bleeding with Endovaginal
Sonography and Sonohysterography. [id number]; 191:[id number])
2. Possible 2.7 cm cystic lesion in the right adnexa, not well seen
on transvaginal images. This is almost certainly benign, but follow
up ultrasound is recommended in 1 year according to the Society of
Radiologists in [id number] Consensus Conference Statement (JON
Root et al. Management of Asymptomatic Ovarian and Other Adnexal
Cysts Imaged at US: Society of Radiologists in Ultrasound Consensus
